# Patient Record
Sex: Female | Born: 1980 | Race: White | Hispanic: No | Smoking: Never smoker
Health system: Southern US, Community
[De-identification: ages and names within clinical notes are randomized; demographics above are authoritative.]

## PROBLEM LIST (undated history)

## (undated) ENCOUNTER — Ambulatory Visit: Admission: EM | Payer: MEDICAID

## (undated) DIAGNOSIS — F32A Depression, unspecified: Secondary | ICD-10-CM

## (undated) DIAGNOSIS — T7840XA Allergy, unspecified, initial encounter: Secondary | ICD-10-CM

## (undated) DIAGNOSIS — T4145XA Adverse effect of unspecified anesthetic, initial encounter: Secondary | ICD-10-CM

## (undated) DIAGNOSIS — F419 Anxiety disorder, unspecified: Secondary | ICD-10-CM

## (undated) DIAGNOSIS — Z9889 Other specified postprocedural states: Secondary | ICD-10-CM

## (undated) DIAGNOSIS — R112 Nausea with vomiting, unspecified: Secondary | ICD-10-CM

## (undated) DIAGNOSIS — E119 Type 2 diabetes mellitus without complications: Secondary | ICD-10-CM

## (undated) DIAGNOSIS — M545 Low back pain, unspecified: Secondary | ICD-10-CM

## (undated) DIAGNOSIS — G709 Myoneural disorder, unspecified: Secondary | ICD-10-CM

## (undated) DIAGNOSIS — F209 Schizophrenia, unspecified: Secondary | ICD-10-CM

## (undated) DIAGNOSIS — J45909 Unspecified asthma, uncomplicated: Secondary | ICD-10-CM

## (undated) DIAGNOSIS — G43909 Migraine, unspecified, not intractable, without status migrainosus: Secondary | ICD-10-CM

## (undated) DIAGNOSIS — K219 Gastro-esophageal reflux disease without esophagitis: Secondary | ICD-10-CM

## (undated) DIAGNOSIS — M1990 Unspecified osteoarthritis, unspecified site: Secondary | ICD-10-CM

## (undated) DIAGNOSIS — T8859XA Other complications of anesthesia, initial encounter: Secondary | ICD-10-CM

## (undated) DIAGNOSIS — F319 Bipolar disorder, unspecified: Secondary | ICD-10-CM

## (undated) DIAGNOSIS — IMO0001 Reserved for inherently not codable concepts without codable children: Secondary | ICD-10-CM

## (undated) DIAGNOSIS — N200 Calculus of kidney: Secondary | ICD-10-CM

## (undated) DIAGNOSIS — D649 Anemia, unspecified: Secondary | ICD-10-CM

## (undated) DIAGNOSIS — E282 Polycystic ovarian syndrome: Secondary | ICD-10-CM

## (undated) DIAGNOSIS — I10 Essential (primary) hypertension: Secondary | ICD-10-CM

## (undated) DIAGNOSIS — F329 Major depressive disorder, single episode, unspecified: Secondary | ICD-10-CM

## (undated) HISTORY — DX: Allergy, unspecified, initial encounter: T78.40XA

## (undated) HISTORY — DX: Migraine, unspecified, not intractable, without status migrainosus: G43.909

## (undated) HISTORY — DX: Polycystic ovarian syndrome: E28.2

## (undated) HISTORY — PX: OTHER SURGICAL HISTORY: SHX169

## (undated) HISTORY — DX: Myoneural disorder, unspecified: G70.9

## (undated) HISTORY — PX: CHOLECYSTECTOMY: SHX55

## (undated) HISTORY — DX: Calculus of kidney: N20.0

## (undated) HISTORY — DX: Essential (primary) hypertension: I10

## (undated) HISTORY — DX: Anemia, unspecified: D64.9

## (undated) HISTORY — DX: Gastro-esophageal reflux disease without esophagitis: K21.9

## (undated) HISTORY — DX: Unspecified asthma, uncomplicated: J45.909

## (undated) HISTORY — DX: Unspecified osteoarthritis, unspecified site: M19.90

---

## 2002-11-03 ENCOUNTER — Encounter: Payer: Self-pay | Admitting: Emergency Medicine

## 2002-11-03 ENCOUNTER — Emergency Department (HOSPITAL_COMMUNITY): Admission: EM | Admit: 2002-11-03 | Discharge: 2002-11-04 | Payer: Self-pay | Admitting: Emergency Medicine

## 2003-03-21 ENCOUNTER — Other Ambulatory Visit: Admission: RE | Admit: 2003-03-21 | Discharge: 2003-03-21 | Payer: Self-pay | Admitting: Obstetrics and Gynecology

## 2003-08-22 ENCOUNTER — Ambulatory Visit (HOSPITAL_COMMUNITY): Admission: RE | Admit: 2003-08-22 | Discharge: 2003-08-22 | Payer: Self-pay

## 2004-02-29 ENCOUNTER — Inpatient Hospital Stay (HOSPITAL_COMMUNITY): Admission: AD | Admit: 2004-02-29 | Discharge: 2004-03-02 | Payer: Self-pay | Admitting: Family Medicine

## 2004-06-06 ENCOUNTER — Ambulatory Visit (HOSPITAL_COMMUNITY): Admission: RE | Admit: 2004-06-06 | Discharge: 2004-06-06 | Payer: Self-pay | Admitting: Family Medicine

## 2004-11-09 ENCOUNTER — Emergency Department (HOSPITAL_COMMUNITY): Admission: EM | Admit: 2004-11-09 | Discharge: 2004-11-09 | Payer: Self-pay | Admitting: *Deleted

## 2005-02-03 ENCOUNTER — Emergency Department (HOSPITAL_COMMUNITY): Admission: EM | Admit: 2005-02-03 | Discharge: 2005-02-03 | Payer: Self-pay | Admitting: Emergency Medicine

## 2005-02-05 ENCOUNTER — Ambulatory Visit (HOSPITAL_COMMUNITY): Admission: RE | Admit: 2005-02-05 | Discharge: 2005-02-05 | Payer: Self-pay | Admitting: Family Medicine

## 2005-03-24 ENCOUNTER — Encounter: Admission: RE | Admit: 2005-03-24 | Discharge: 2005-03-24 | Payer: Self-pay | Admitting: Neurology

## 2005-07-22 ENCOUNTER — Other Ambulatory Visit: Admission: RE | Admit: 2005-07-22 | Discharge: 2005-07-22 | Payer: Self-pay | Admitting: Obstetrics and Gynecology

## 2006-05-15 ENCOUNTER — Ambulatory Visit (HOSPITAL_COMMUNITY): Admission: RE | Admit: 2006-05-15 | Discharge: 2006-05-15 | Payer: Self-pay | Admitting: Obstetrics and Gynecology

## 2007-08-18 ENCOUNTER — Emergency Department (HOSPITAL_COMMUNITY): Admission: EM | Admit: 2007-08-18 | Discharge: 2007-08-18 | Payer: Self-pay | Admitting: Emergency Medicine

## 2007-11-13 ENCOUNTER — Encounter: Admission: RE | Admit: 2007-11-13 | Discharge: 2007-11-13 | Payer: Self-pay | Admitting: Obstetrics and Gynecology

## 2008-03-29 ENCOUNTER — Emergency Department (HOSPITAL_COMMUNITY): Admission: EM | Admit: 2008-03-29 | Discharge: 2008-03-29 | Payer: Self-pay | Admitting: Emergency Medicine

## 2008-08-01 ENCOUNTER — Ambulatory Visit (HOSPITAL_COMMUNITY): Admission: RE | Admit: 2008-08-01 | Discharge: 2008-08-01 | Payer: Self-pay | Admitting: Family Medicine

## 2008-09-28 ENCOUNTER — Ambulatory Visit (HOSPITAL_COMMUNITY): Admission: RE | Admit: 2008-09-28 | Discharge: 2008-09-28 | Payer: Self-pay | Admitting: Family Medicine

## 2009-02-16 ENCOUNTER — Ambulatory Visit (HOSPITAL_COMMUNITY): Admission: RE | Admit: 2009-02-16 | Discharge: 2009-02-16 | Payer: Self-pay | Admitting: Obstetrics and Gynecology

## 2009-03-09 ENCOUNTER — Ambulatory Visit (HOSPITAL_COMMUNITY): Admission: RE | Admit: 2009-03-09 | Discharge: 2009-03-09 | Payer: Self-pay | Admitting: Obstetrics and Gynecology

## 2009-03-09 ENCOUNTER — Encounter (INDEPENDENT_AMBULATORY_CARE_PROVIDER_SITE_OTHER): Payer: Self-pay | Admitting: Obstetrics and Gynecology

## 2009-04-07 ENCOUNTER — Ambulatory Visit (HOSPITAL_COMMUNITY): Admission: RE | Admit: 2009-04-07 | Discharge: 2009-04-07 | Payer: Self-pay | Admitting: Obstetrics and Gynecology

## 2010-07-08 ENCOUNTER — Encounter: Payer: Self-pay | Admitting: Family Medicine

## 2010-09-21 LAB — CBC
HCT: 37.6 % (ref 36.0–46.0)
Hemoglobin: 12.4 g/dL (ref 12.0–15.0)
MCHC: 33.1 g/dL (ref 30.0–36.0)
MCV: 87.5 fL (ref 78.0–100.0)
Platelets: 249 10*3/uL (ref 150–400)
RBC: 4.29 MIL/uL (ref 3.87–5.11)
RDW: 13 % (ref 11.5–15.5)
WBC: 10.7 10*3/uL — ABNORMAL HIGH (ref 4.0–10.5)

## 2010-09-21 LAB — PREGNANCY, URINE: Preg Test, Ur: NEGATIVE

## 2010-11-02 NOTE — Discharge Summary (Signed)
NAME:  Andrea [name], Andrea [name]                      ACCOUNT NO.:  [id number]   MEDICAL RECORD NO.:  [id number]                   PATIENT TYPE:  INP   LOCATION:  A311                                 FACILITY:  [facility]   PHYSICIAN:  Scott A. Gerda Diss, M.D.               DATE OF BIRTH:  01/07/1981   DATE OF ADMISSION:  02/29/2004  DATE OF DISCHARGE:  03/02/2004                                 DISCHARGE SUMMARY   DISCHARGE DIAGNOSES:  1.  Bronchitis with supraglottic inflammation and stridor.  2.  Abdominal pain/gastritis.   HOSPITAL COURSE:  This 30 year old white female was admitted in after  complaining of some difficulty breathing, coughing, also severe soreness in  the right upper quadrant. She was admitted in, and after she was admitted  in, her abdominal pain localized to the epigastric area with no right upper  quadrant tenderness. Her ultrasound was negative for gallstones, and her  abdomen was consistent with some mild gastritis. She does have some  supraglottic irrigation that causes her to have occasional stridor, but she  was not in respiratory distress during hospitalization. She responded to  [facility] as well as steroids. We went ahead and covered her at discharge on  Zithromax to cover for bronchitis. There was some variation in her stridor  depending on if you asked her to take a deep breath versus just listening to  her when she was in restful breathing. She was stable for discharge on  September 16 and discharged to home at that time.     ___________________________________________                                         Jonna Coup Gerda Diss, M.D.   SAL/MEDQ  D:  03/02/2004  T:  03/02/2004  Job:  [id number]

## 2010-11-02 NOTE — Consult Note (Signed)
NAME:  Andrea [name], Andrea [name]                      ACCOUNT NO.:  [id number]   MEDICAL RECORD NO.:  [id number]                   PATIENT TYPE:  INP   LOCATION:  A311                                 FACILITY:  [facility]   PHYSICIAN:  Scott A. Gerda Diss, M.D.               DATE OF BIRTH:  1980-10-04   DATE OF CONSULTATION:  DATE OF DISCHARGE:                                   CONSULTATION   HISTORY:  The patient overall is doing pretty well.  She is having some  coughing and congestion.  Also some soreness in her abdomen.  We will await  the ultrasound for today.  No pneumonia is heard.  There is mild  supraglottic irritation secondary to what is most likely a viral illness.                                                Scott A. Gerda Diss, M.D.    Hadley Pen  D:  03/02/2004  T:  03/02/2004  Job:  [id number]

## 2010-11-02 NOTE — H&P (Signed)
Andrea [name], KLIETHERMES            ACCOUNT NO.:  [id number]   MEDICAL RECORD NO.:  [id number]          PATIENT TYPE:  INP   LOCATION:  A311                          FACILITY:  [facility]   PHYSICIAN:  Donna Bernard, M.D.DATE OF BIRTH:  1981-06-17   DATE OF ADMISSION:  02/29/2004  DATE OF DISCHARGE:  09/16/2005LH                                HISTORY & PHYSICAL   CHIEF COMPLAINT:  Shortness of breath, abdominal discomfort.   SUBJECTIVE:  This patient is a 30 year old white female with a history of  impaired glucose tolerance, who presented to the office the day of admission  with two acute complaints.  The patient had noted that she had had  congestion and drainage and cough over this last several days, but it had  proceeded to the point of affecting her ability to breathe.  She was having  significant dyspnea, and this was accompanied by a wheezy sensation.  She  was given a nebulizer treatment in the office with, in fact, no improvement,  if anything somewhat worsening.  The patient also noted right upper  abdominal discomfort progressive over the past week, at times seems to be  worse after meals.  There is minimal radiation.  At times the abdominal  discomfort is quite significant.  The patient notes compliance with her  current medications, which include Glucophage 500 mg one t.i.d.   FAMILY HISTORY:  Noncontributory.   SOCIAL HISTORY:  The patient is married.   No known allergies.   PAST MEDICAL HISTORY:  Significant for as noted above.   REVIEW OF SYSTEMS:  Otherwise negative.   PHYSICAL EXAMINATION:  VITAL SIGNS:  Afebrile.  GENERAL:  The patient is an alert, anxious-appearing white female with some  obvious respiratory distress.  HEENT:  Normal.  Pharynx normal.  TMs normal.  Ocular exam normal.  NECK:  Supple, no lymphadenopathy.  CHEST:  Stridor-like sounds occurring with breathing.  There also appears to  be mild reactive component when listening to the chest.  No  tachypnea.  CARDIAC:  Regular rate and rhythm.  ABDOMEN:  Right upper quadrant tenderness to deep palpation.  No CVA  tenderness.  Low abdomen nontender.  EXTREMITIES:  Normal.  NEUROLOGIC:  Intact.   IMPRESSION:  1.  Respiratory distress with significant stridor component.  This is the      primary reason for admission.  2.  Abdominal pain, right upper quadrant, steady but worsening.   PLAN:  1.  Admit for racemic epinephrine treatment, Solu-Medrol IV, Ventolin via      nebulizer p.r.n. for wheezes, nasal cannula O2, monitor O2 saturation.  2.  Initiate antibiotics, Rocephin specifically, due to current symptoms.  3.  Check ultrasound of the gallbladder.  Further or as is noted in the      chart.      WSL/MEDQ  D:  03/23/2004  T:  03/23/2004  Job:  [id number]

## 2010-11-02 NOTE — Op Note (Signed)
NAMEBRAYLYN, EYE            ACCOUNT NO.:  [id number]   MEDICAL RECORD NO.:  [id number]          PATIENT TYPE:  AMB   LOCATION:  SDC                           FACILITY:  [facility]   PHYSICIAN:  Malva Limes, M.D.    DATE OF BIRTH:  1980/12/16   DATE OF PROCEDURE:  05/15/2006  DATE OF DISCHARGE:                               OPERATIVE REPORT   PREOPERATIVE DIAGNOSES:  1. Complex left ovarian cyst.  2. History of infertility.  3. History of anovulation.   POSTOPERATIVE DIAGNOSES:  1. Complex left ovarian cyst.  2. History of infertility.  3. History of anovulation.   PROCEDURES:  1. Diagnostic laparoscopy  2. Left ovarian cystectomy.  3. Removal of the anterior cul-de-sac nodule.  4. Removal of left ovarian nodule.  5. Lysis of adhesions on right ovary.   SURGEON:  Dr. Dareen Piano.   ASSISTANT:  Dr. Greta Doom.   ANESTHESIA:  General endotracheal.   ANTIBIOTICS:  Ancef 1 gram.   DRAINS:  Red rubber catheter to bladder.   COMPLICATIONS:  None.   SPECIMENS:  Left ovarian cyst wall sent to pathology, anterior cul-de-  sac nodule sent to pathology, and the left serosal nodule versus ovary  sent to pathology.   INDICATIONS:  The patient is a 30 year old white female G0, P0, with a  long history of the anovulation.  The patient had tried Glucophage  without success and was going to use Clomid to treat her anovulatory  cycles.  However, she was discovered to have an ovarian cyst.  This was  followed and showed no change.  On 04/30/2006, the patient had a follow-  up ultrasound which revealed 6.5 cm ovarian cyst.  It appeared that  there was one septation, no solid nodules and normal Doppler flow.  Because of this, the patient was unable to begin Clomid and it was  decided to perform a laparoscopy and ovarian cystectomy.   PROCEDURE:  The patient was taken to the operating room where she was  placed in dorsal supine position and general anesthetic was administered  without  complications.  She was then prepped and draped in usual  fashion.  A cone cannula placed into the cervical os.  The umbilicus had  a vertical skin incision made, carried down to fascia.  Fascia was  entered sharply.  Parietal peritoneum was grasped and entered sharply.  Sutures were placed laterally and the Hassan cannula placed in the  peritoneal cavity.  Three liters of carbon dioxide was insufflated.  The  patient was placed in Trendelenburg.  The scope was then placed.  At  this point, ports were placed in the right and left lower quadrants,  under direct visualization, and a 10-mm port placed in the suprapubic  region.  The patient was noted to have a low normal liver and  gallbladder.  There were no abdominal adhesions.  The uterus appeared to  be normal.  Fallopian tube on the right was easily visualized and  appeared to be normal.  The right ovary was adherent to the right pelvic  sidewall.  The patient had a large ovarian cyst on  the left ovary.  The  left fallopian tube could not be seen until after the cystectomy was  performed.  At that point it was discovered to be normal.  To begin the  cystectomy an incision was made in the serosal surface of the ovary with  a needle suction irrigator.  The serosa was opened and grasped with  nontraumatic graspers.  The ovarian cyst was shelled out approximately  50-70% when a rent was made in the capsule cyst.  At this point copious  amounts of yellow clear fluid were noted.  On examining the inner lining  of the ovarian cyst, there appeared to be no nodules.  It was smooth  throughout.  The cyst was then pulled from the ovary.  Minimal bleeding  was noted.  Once this was accomplished, it appeared that there was  either ovarian tissue or a nodule in the area just inferior to the  midportion of the tube.  This had several small cysts on it and was  worrisome.  Therefore, this was excised and placed in the posterior cul-  de-sac.  At this  point the EndoCatch was placed in the abdominal cavity  and the ovarian cyst and nodule placed into the bag and removed through  the port in the midline.  At this point, copious irrigation was  performed and hemostasis appeared to be adequate.  The patient was  discovered to have a small cystic nodule in the left anterior cul-de-  sac.  This was grasped and the entire peritoneum excised in this area  and pulled through the port.  It appeared to be approximately 5-7 mm.  At this point, chromotubation was performed.  The blue dye was easily  seen to flow through both fallopian tubes.  The fimbria appeared to be  healthy and normal.  This completed the procedure.  Copious irrigation  was formed, hemostasis again checked and found to be good.  The  instruments were all removed, the ports removed, the fascia closed with  interrupted 0 Vicryl suture and the skin with 4-0 Vicryl suture.  The  inferior ports were closed with Dermabond.  The patient was discharged  to home.  Prior to discharge she will have a CA-125 and a CEA obtained.  She will be sent home with Percocet and asked to return to the office in  2 weeks.           ______________________________  Malva Limes, M.D.     MA/MEDQ  D:  05/15/2006  T:  05/15/2006  Job:  [id number]

## 2010-11-02 NOTE — Consult Note (Signed)
Andrea [name], Andrea [name]            ACCOUNT NO.:  [id number]   MEDICAL RECORD NO.:  [id number]          PATIENT TYPE:  EMS   LOCATION:  MAJO                         FACILITY:  [facility]   PHYSICIAN:  Pramod P. Pearlean Brownie, MD    DATE OF BIRTH:  07/01/1980   DATE OF CONSULTATION:  11/09/2004  DATE OF DISCHARGE:                                   CONSULTATION   REFERRING PHYSICIAN:  Carren Rang, M.D.   REASON FOR REFERRAL:  Speech difficulty and left-sided weakness.   HISTORY OF PRESENT ILLNESS:  Andrea [name] is a 30 year old Caucasian lady  who developed apparently sudden onset of altered mental status and speech  difficulties this afternoon.  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  She was initially thought to have been exhausted from overexertion, however,  when she did not respond after several hours, EMS was called and EMS  described her upon arriving as having some tonic-clonic movements in her  arms with some fluttering of her eyes.  However, she was awake with eyes  open.  There were no tongue biting or incontinence noted.  The patient  appeared to be flushed and somewhat mottled.  Subsequently she was found to  be having difficulty speaking and had a very hesitant effort for speech.  She was also found not moving the left side as well as the right.  She was  taken to the emergency room at [facility] in [place] where a  noncontrast CAT scan of head was unremarkable.  Since the patient continued  to have persistent left-sided weakness and some speech difficulties, and did  not have a neurologist on call, I was called by Dr. Beverely Pace who recommended  doing an MRI scan of the brain; however, an MRI was not available at  [facility].  The patient was transferred to the emergency room at  [facility] for my  consultation and MRI.  The patient denied any  previous history of stroke, TIA, migraine or significant neurological  problems.   PAST MEDICAL HISTORY:  1.  Diabetes.  2.  Polycystic ovarian syndrome.   HOME MEDICATIONS:  Allegra, amoxicillin, Glucophage, vitamins.   MEDICATION ALLERGIES:  None.   SOCIAL HISTORY:  The patient is married and lives with her husband in  [place].  She does not smoke or drink and denies doing drugs.  Her family  physician is Dr. Lilyan Punt.  The patient denies any prior known history  of psychiatric problems, anxiety, depression or significant stressors in her  life.   REVIEW OF SYSTEMS:  Not significant for recent fever, cough, chest pain,  diarrhea, shortness of breath, or other illness.   PHYSICAL EXAMINATION:  GENERAL:  Reveals an obese, young, Caucasian lady who  is not in distress.  VITAL SIGNS:  Afebrile.  Pulse rate is 100 per minute.  Respiratory rate 18  per minute.  Blood pressure 148/92.  Distal pulses are well  felt.  HEAD:  Nontraumatic.  ENT exam is unremarkable.  NECK:  Supple without bruit.  CARDIAC EXAM:  No murmurs, rubs, or gallops.  LUNGS:  Clear to auscultation.  ABDOMEN:  Soft, nontender.  NEUROLOGICAL EXAM:  The patient is awake, alert and cooperative.  She has a  nonfluent speech with significant word hesitation.  However, there is no  clear dysarthria or any ___________.  She can name, repeat and comprehend  quite well.  Eye movements are full range with no nystagmus.  Visual acuity  and field side adequate.  Face is symmetric.  Bilateral movements are  normal.  Tongue is midline.  Motor system exam reveals no significant  weakness on the right side.  The patient has poor effort on the left side  and when lying supine, she initially was unable to lift her hand off the  bed.  However, when I passively held it front of her face, she was able to  maintain tone there without any drift of the hand.  Similarly in the leg,   she states weakness in moving the leg off the bed, but is able to maintain  tone against pressure.  There is __________ and strength on the left side.  She similarly had subjective decreased sensation on the left side, but on  __________ stimulation is able to perceive sensation on both sides.  Deep  tendon reflexes are 2+, symmetric and __________ plantars are downgoing.  Coordination is slow and deliberate on the left.  Gait was not tested.   DATA REVIEWED:  Noncontrast MRI scan of the brain done today reveals no  evidence of acute ischemia on the diffusion weighted images.  The rest of  the brain scan appears unremarkable.  CT scan of the brain was unremarkable.   LABORATORY DATA:  Done at [facility] today:  Blood glucose 114 as  reported by the EMS at scene.  Blood electrolytes normal.  Urine pregnancy  test was negative.  Urine wbc count was 12.0.  Urine drug screen is  negative.   IMPRESSION:  A 30 year old lady with sudden onset of speech difficulties and  questionable left-sided weakness with neurological exam which is not  fluctuating and not consistent with organic basis.  I think there is  significant underlying psychological distress which is causing the patient's  findings.  Even though the patient categorically denies significant  underlying stress, I think the patient would resolve by consultation of the  ACT Team to provide psychological support for the patient.  I do not believe  that the patient is at risk for recurrent strokes and hence may be  discharged to the care of her family.  She may follow up with her family  physician Dr. Gerda Diss electively in [place] as necessary.   Thank you for this referral.      PPS/MEDQ  D:  11/09/2004  T:  11/10/2004  Job:  [id number]   cc:   Carren Rang, M.D.

## 2010-11-22 ENCOUNTER — Ambulatory Visit (HOSPITAL_COMMUNITY)
Admission: RE | Admit: 2010-11-22 | Discharge: 2010-11-22 | Disposition: A | Discharge status: Home / Self Care | Payer: BC Managed Care – PPO | Source: Ambulatory Visit | Attending: Family Medicine | Admitting: Family Medicine

## 2010-11-22 ENCOUNTER — Other Ambulatory Visit: Payer: Self-pay | Admitting: Family Medicine

## 2010-11-22 DIAGNOSIS — R1011 Right upper quadrant pain: Secondary | ICD-10-CM

## 2010-11-22 DIAGNOSIS — R112 Nausea with vomiting, unspecified: Secondary | ICD-10-CM | POA: Insufficient documentation

## 2010-11-22 DIAGNOSIS — K769 Liver disease, unspecified: Secondary | ICD-10-CM | POA: Insufficient documentation

## 2010-11-22 DIAGNOSIS — R110 Nausea: Secondary | ICD-10-CM

## 2010-11-23 ENCOUNTER — Other Ambulatory Visit: Payer: Self-pay | Admitting: Family Medicine

## 2010-11-26 ENCOUNTER — Emergency Department (HOSPITAL_COMMUNITY)
Admission: EM | Admit: 2010-11-26 | Discharge: 2010-11-26 | Disposition: A | Discharge status: Home / Self Care | Payer: BC Managed Care – PPO | Attending: Emergency Medicine | Admitting: Emergency Medicine

## 2010-11-26 ENCOUNTER — Emergency Department (HOSPITAL_COMMUNITY): Payer: BC Managed Care – PPO

## 2010-11-26 ENCOUNTER — Encounter (HOSPITAL_COMMUNITY): Payer: Self-pay | Admitting: Radiology

## 2010-11-26 DIAGNOSIS — E282 Polycystic ovarian syndrome: Secondary | ICD-10-CM | POA: Insufficient documentation

## 2010-11-26 DIAGNOSIS — Z79899 Other long term (current) drug therapy: Secondary | ICD-10-CM | POA: Insufficient documentation

## 2010-11-26 DIAGNOSIS — R1011 Right upper quadrant pain: Secondary | ICD-10-CM | POA: Insufficient documentation

## 2010-11-26 LAB — DIFFERENTIAL
Eosinophils Relative: 2 % (ref 0–5)
Lymphocytes Relative: 27 % (ref 12–46)
Lymphs Abs: 2 10*3/uL (ref 0.7–4.0)
Monocytes Absolute: 0.4 10*3/uL (ref 0.1–1.0)
Monocytes Relative: 6 % (ref 3–12)

## 2010-11-26 LAB — URINALYSIS, ROUTINE W REFLEX MICROSCOPIC
Bilirubin Urine: NEGATIVE
Nitrite: NEGATIVE
Protein, ur: NEGATIVE mg/dL
Specific Gravity, Urine: 1.02 (ref 1.005–1.030)
Urobilinogen, UA: 0.2 mg/dL (ref 0.0–1.0)

## 2010-11-26 LAB — CBC
HCT: 36.9 % (ref 36.0–46.0)
MCH: 27.6 pg (ref 26.0–34.0)
MCHC: 32 g/dL (ref 30.0–36.0)
MCV: 86.4 fL (ref 78.0–100.0)
RDW: 13.4 % (ref 11.5–15.5)

## 2010-11-26 LAB — COMPREHENSIVE METABOLIC PANEL
BUN: 9 mg/dL (ref 6–23)
Calcium: 9.3 mg/dL (ref 8.4–10.5)
GFR calc Af Amer: >60 mL/min (ref >60)
GFR calc non Af Amer: >60 mL/min (ref >60)
Glucose, Bld: 93 mg/dL (ref 70–99)
Total Protein: 7.3 g/dL (ref 6.0–8.3)

## 2010-11-26 LAB — URINE MICROSCOPIC-ADD ON

## 2010-11-26 LAB — LIPASE, BLOOD: Lipase: 28 U/L (ref 11–59)

## 2010-11-26 MED ORDER — IOHEXOL 300 MG/ML  SOLN
100.0000 mL | Freq: Once | INTRAMUSCULAR | Status: AC | PRN
  Administered 2010-11-26: 100 mL via INTRAVENOUS

## 2010-11-26 MED ORDER — TECHNETIUM TC 99M MEBROFENIN IV KIT
5.0000 | PACK | Freq: Once | INTRAVENOUS | Status: AC | PRN
  Administered 2010-11-26: 5.18 via INTRAVENOUS

## 2010-11-28 ENCOUNTER — Encounter (HOSPITAL_COMMUNITY): Payer: BC Managed Care – PPO

## 2010-11-28 ENCOUNTER — Other Ambulatory Visit: Payer: Self-pay | Admitting: General Surgery

## 2010-11-29 ENCOUNTER — Encounter (HOSPITAL_COMMUNITY): Payer: BC Managed Care – PPO

## 2010-11-30 ENCOUNTER — Ambulatory Visit (HOSPITAL_COMMUNITY)
Admission: RE | Admit: 2010-11-30 | Discharge: 2010-11-30 | Disposition: A | Discharge status: Home / Self Care | Payer: BC Managed Care – PPO | Source: Ambulatory Visit | Attending: General Surgery | Admitting: General Surgery

## 2010-11-30 DIAGNOSIS — Z01818 Encounter for other preprocedural examination: Secondary | ICD-10-CM | POA: Insufficient documentation

## 2010-11-30 DIAGNOSIS — Z01812 Encounter for preprocedural laboratory examination: Secondary | ICD-10-CM | POA: Insufficient documentation

## 2010-11-30 DIAGNOSIS — K811 Chronic cholecystitis: Secondary | ICD-10-CM | POA: Insufficient documentation

## 2010-12-10 NOTE — Op Note (Signed)
Andrea [name], FRATTO            ACCOUNT NO.:  [id number]  MEDICAL RECORD NO.:  [id number]  LOCATION:  DAYP                          FACILITY:  [facility]  PHYSICIAN:  Dalia Heading, M.D.  DATE OF BIRTH:  08-Jun-1981  DATE OF PROCEDURE:  11/30/2010 DATE OF DISCHARGE:                              OPERATIVE REPORT   PREOPERATIVE DIAGNOSIS:  Chronic cholecystitis.  POSTOPERATIVE DIAGNOSIS:  Chronic cholecystitis.  PROCEDURE:  Laparoscopic cholecystectomy.  SURGEON:  Dalia Heading, MD  ANESTHESIA:  General endotracheal.  INDICATIONS:  The patient is a 30 year old morbidly obese white female who presents with worsening right upper quadrant abdominal pain. Hepatobiliary scan revealed a low gallbladder ejection fraction with reproducible symptoms with CCK injection.  She now presents for laparoscopic cholecystectomy.  The risks and benefits of procedure including bleeding, infection, hepatobiliary injury, and possibility of an open procedure were fully explained to the patient, gave informed consent.  PROCEDURE NOTE:  The patient was placed in the supine position.  After induction of general endotracheal anesthesia, the abdomen was prepped and draped using the usual sterile technique with DuraPrep.  Surgical site confirmation was performed.  A supraumbilical incision was made down to the fascia.  A Veress needle was introduced into the abdominal cavity and confirmation of placement was done using the saline drop test.  The abdomen was then insufflated to 16 mmHg of pressure.  An 11-mm trocar was introduced into the abdominal cavity under direct visualization without difficulty.  The patient was placed in reverse Trendelenburg position and additional 11- mm trocar was placed in the epigastric region and 5-mm trocars were placed in right upper quadrant and right flank regions.  The liver was inspected and noted to be within normal limits.  The gallbladder was retracted  superolaterally.  Dissection was begun around the infundibulum of the gallbladder.  The cystic duct was first identified.  The infundibulum was fully identified.  Endoclips were placed proximally and distally on the cystic duct and the cystic duct was divided.  This likewise was done on the cystic artery.  The gallbladder was then freed away from the gallbladder fossa using Bovie electrocautery.  The gallbladder was delivered through the epigastric trocar site using Endocatch bag.  The gallbladder fossa was inspected and no abnormal bleeding or bile leakage was noted.  Surgicel was placed in the gallbladder fossa.  All fluid and air were then evacuated from the abdominal cavity prior to removal of the trocars.  All wounds were irrigated with normal saline.  All wounds were injected with 0.5% Sensorcaine.  The supraumbilical fascia was reapproximated using an 0 Vicryl interrupted suture.  All skin incisions were closed using staples.  Betadine ointment and dry sterile dressings were applied.  All tape and needle counts were correct at the end of the procedure. The patient was extubated in the operating room and went back to the recovery room in awake and stable condition.  COMPLICATIONS:  None.  SPECIMEN:  Gallbladder.  BLOOD LOSS:  Minimal.     Dalia Heading, M.D.     MAJ/MEDQ  D:  11/30/2010  T:  12/01/2010  Job:  [id number]  cc:   Lorin Picket A. Gerda Diss, MD Fax: [phone]  Electronically Signed by Franky Macho M.D. on 12/10/2010 07:28:11 AM

## 2010-12-10 NOTE — H&P (Signed)
  Andrea [name], Andrea [name]            ACCOUNT NO.:  [id number]  MEDICAL RECORD NO.:  [id number]  LOCATION:  DAY                           FACILITY:  [facility]  PHYSICIAN:  Dalia Heading, M.D.  DATE OF BIRTH:  Oct 12, 1980  DATE OF ADMISSION:  11/27/2010 DATE OF DISCHARGE:  LH                             HISTORY & PHYSICAL   CHIEF COMPLAINT:  Chronic cholecystitis.  HISTORY OF PRESENT ILLNESS:  The patient is an obese white female who presents with several-week history of worsening right upper quadrant abdominal pain with radiation to the right flank, nausea, vomiting.  She states she does have fatty food intolerance.  She does have intermittent fever and chills, but no jaundice.  PAST MEDICAL HISTORY:  Includes anxiety/depressive disorder.  PAST SURGICAL HISTORY:  Ovarian cyst removal.  CURRENT MEDICATIONS:  Zoloft, Topamax, Altavera.  ALLERGIES:  Vicodin.  REVIEW OF SYSTEMS:  The patient denies drinking or smoking.  She denies any illicit drug use.  FAMILY MEDICAL HISTORY:  Noncontributory.  PHYSICAL EXAMINATION:  GENERAL:  The patient is a well-developed, well- nourished white female in no acute distress. HEENT:  Unremarkable. NECK:  Supple without lymphadenopathy. LUNGS:  Clear to auscultation with equal breath sounds bilaterally. HEART:  Examination reveals regular rate and rhythm without S3, S4, or murmurs. ABDOMEN:  Soft with tenderness noted on the right upper quadrant to palpation.  No hepatosplenomegaly, masses, hernias are identified.  Ultrasound of the gallbladder is negative.  CT scan of the abdomen and pelvis is negative.  HIDA  scan reveals a 34% ejection fraction with reducible symptoms with CCK injection.  IMPRESSION:  Chronic cholecystitis.  PLAN:  The patient is scheduled for a laparoscopic cholecystectomy on November 30, 2010.  The risks and benefits of the procedure including bleeding, infection, hepatobiliary, the possibility of an open procedure were  fully explained to the patient, gave informed consent.     Dalia Heading, M.D.     MAJ/MEDQ  D:  11/27/2010  T:  11/28/2010  Job:  [id number]  cc:   Short Stay To [facility]  Scott A. Gerda Diss, MD Fax: [phone]  Electronically Signed by Franky Macho M.D. on 12/10/2010 07:28:09 AM

## 2011-03-11 LAB — URINE MICROSCOPIC-ADD ON

## 2011-03-11 LAB — URINALYSIS, ROUTINE W REFLEX MICROSCOPIC
Bilirubin Urine: NEGATIVE
Nitrite: NEGATIVE
Specific Gravity, Urine: 1.02
Urobilinogen, UA: 0.2
pH: 6.5

## 2011-03-11 LAB — BASIC METABOLIC PANEL
BUN: 8
CO2: 29
Calcium: 8.9
Chloride: 102
Creatinine, Ser: 0.72
GFR calc Af Amer: >60
Glucose, Bld: 90

## 2011-03-11 LAB — PREGNANCY, URINE: Preg Test, Ur: NEGATIVE

## 2011-03-11 LAB — CBC
MCHC: 33.4
MCV: 86.2
Platelets: 222
RDW: 13.1

## 2011-03-11 LAB — DIFFERENTIAL
Basophils Absolute: 0.1
Basophils Relative: 1
Eosinophils Absolute: 0.2
Neutro Abs: 4.2
Neutrophils Relative %: 51

## 2011-08-26 ENCOUNTER — Other Ambulatory Visit: Payer: Self-pay | Admitting: Family Medicine

## 2011-08-26 ENCOUNTER — Ambulatory Visit (HOSPITAL_COMMUNITY)
Admission: RE | Admit: 2011-08-26 | Discharge: 2011-08-26 | Disposition: A | Discharge status: Home / Self Care | Payer: BC Managed Care – PPO | Source: Ambulatory Visit | Attending: Family Medicine | Admitting: Family Medicine

## 2011-08-26 DIAGNOSIS — M545 Low back pain, unspecified: Secondary | ICD-10-CM | POA: Insufficient documentation

## 2011-08-26 DIAGNOSIS — R05 Cough: Secondary | ICD-10-CM | POA: Insufficient documentation

## 2011-08-26 DIAGNOSIS — R0602 Shortness of breath: Secondary | ICD-10-CM | POA: Insufficient documentation

## 2011-08-26 DIAGNOSIS — R059 Cough, unspecified: Secondary | ICD-10-CM | POA: Insufficient documentation

## 2011-08-26 DIAGNOSIS — M4120 Other idiopathic scoliosis, site unspecified: Secondary | ICD-10-CM | POA: Insufficient documentation

## 2012-02-08 DIAGNOSIS — R0602 Shortness of breath: Secondary | ICD-10-CM

## 2012-03-26 ENCOUNTER — Other Ambulatory Visit: Payer: Self-pay | Admitting: Obstetrics and Gynecology

## 2012-04-30 ENCOUNTER — Other Ambulatory Visit: Payer: Self-pay | Admitting: Family Medicine

## 2012-04-30 ENCOUNTER — Ambulatory Visit (HOSPITAL_COMMUNITY)
Admission: RE | Admit: 2012-04-30 | Discharge: 2012-04-30 | Disposition: A | Discharge status: Home / Self Care | Payer: BC Managed Care – PPO | Source: Ambulatory Visit | Attending: Family Medicine | Admitting: Family Medicine

## 2012-04-30 DIAGNOSIS — R059 Cough, unspecified: Secondary | ICD-10-CM | POA: Insufficient documentation

## 2012-04-30 DIAGNOSIS — R05 Cough: Secondary | ICD-10-CM

## 2012-04-30 DIAGNOSIS — J40 Bronchitis, not specified as acute or chronic: Secondary | ICD-10-CM

## 2012-09-11 ENCOUNTER — Ambulatory Visit (INDEPENDENT_AMBULATORY_CARE_PROVIDER_SITE_OTHER): Payer: BC Managed Care – PPO | Admitting: Nurse Practitioner

## 2012-09-11 ENCOUNTER — Encounter: Payer: Self-pay | Admitting: Nurse Practitioner

## 2012-09-11 VITALS — BP 132/86 | Wt 292.6 lb

## 2012-09-11 DIAGNOSIS — R300 Dysuria: Secondary | ICD-10-CM

## 2012-09-11 DIAGNOSIS — N10 Acute tubulo-interstitial nephritis: Secondary | ICD-10-CM

## 2012-09-11 DIAGNOSIS — J0100 Acute maxillary sinusitis, unspecified: Secondary | ICD-10-CM

## 2012-09-11 LAB — POCT UA - MICROSCOPIC ONLY: Bacteria, U Microscopic: 0

## 2012-09-11 LAB — POCT URINALYSIS DIPSTICK
Protein, UA: NEGATIVE
Spec Grav, UA: 1.01
pH, UA: 7

## 2012-09-11 MED ORDER — OXYCODONE-ACETAMINOPHEN 5-325 MG PO TABS: 1.0000 | ORAL_TABLET | ORAL | Status: DC | PRN

## 2012-09-11 MED ORDER — CEFTRIAXONE SODIUM 1 G IJ SOLR
500.0000 mg | Freq: Once | INTRAMUSCULAR | Status: AC
  Administered 2012-09-11: 500 mg via INTRAMUSCULAR

## 2012-09-11 MED ORDER — CEFPROZIL 500 MG PO TABS: 500.0000 mg | ORAL_TABLET | Freq: Two times a day (BID) | ORAL | Status: DC

## 2012-09-11 MED ORDER — PROMETHAZINE HCL 25 MG PO TABS: 25.0000 mg | ORAL_TABLET | Freq: Four times a day (QID) | ORAL | Status: DC | PRN

## 2012-09-12 ENCOUNTER — Encounter: Payer: Self-pay | Admitting: Nurse Practitioner

## 2012-09-12 DIAGNOSIS — J0100 Acute maxillary sinusitis, unspecified: Secondary | ICD-10-CM | POA: Insufficient documentation

## 2012-09-12 DIAGNOSIS — N200 Calculus of kidney: Secondary | ICD-10-CM | POA: Insufficient documentation

## 2012-09-12 DIAGNOSIS — N10 Acute tubulo-interstitial nephritis: Secondary | ICD-10-CM | POA: Insufficient documentation

## 2012-09-12 NOTE — Progress Notes (Signed)
Subjective:  Presents for complaints of sharp pain in the right flank area during the night last night, the last time she felt this was at 3 AM this morning. Now having a throbbing numbness/soreness in the right flank area towards the lower abdominal area. Nausea, no vomiting. No blood in her urine. Urine has odor and cloudiness. Temp 101-102. Has voided twice today, has had pain with urination. No urgency or frequency. Had a kidney stone a few years ago. No vaginal discharge. No new sexual partners. No history of recent UTI or pyelonephritis. Also complaints of green drainage in her left eye that began 2 days ago. Occurs off and on all day. Some fullness in the ears with decreased hearing. No sore throat. No cough. No wheezing. Slight head congestion.  Objective:   BP 132/86  Wt 292 lb 9.6 oz (132.722 kg)  LMP 08/29/2012 NAD. Alert, oriented. TMs clear effusion, no erythema. Conjunctiva clear. Pharynx mildly erythematous with green PND noted. Neck supple with mild soft nontender adenopathy. No preauricular adenopathy noted. Lungs clear. Heart regular rate rhythm. Positive right CVA area tenderness going into the flank area. Abdomen soft nondistended with mild suprapubic area tenderness. Urine microscopic negative.

## 2012-09-12 NOTE — Assessment & Plan Note (Signed)
Cefzil prescribed. OTC meds as directed for congestion. Call back if worsens or persists.

## 2012-09-12 NOTE — Assessment & Plan Note (Addendum)
Patient possibly has passed or is passing a kidney stone. No blood was noted under urine microscopic. Since she has had a fever will treat for probable pyelonephritis. Rocephin 500 mg IM now. Tomorrow start Cefzil as directed. Also given prescriptions for Phenergan and oxycodone for pain. Call back in 72 hours if no improvement, call or go to ED over the weekend if worse. Warning signs reviewed.

## 2012-10-28 ENCOUNTER — Telehealth: Payer: Self-pay | Admitting: Family Medicine

## 2012-10-28 NOTE — Telephone Encounter (Signed)
Needs office visit with Eber Jones to discuss per Eber Jones

## 2012-10-28 NOTE — Telephone Encounter (Signed)
Patient says she had a miscarriage and is having a hard time dealing with it. Please advise. She is requesting to have something called in but will come in if need be.

## 2012-10-28 NOTE — Telephone Encounter (Signed)
Left message to return call 

## 2012-10-28 NOTE — Telephone Encounter (Signed)
Pt made appt to come in tomorrow.

## 2012-10-29 ENCOUNTER — Ambulatory Visit (INDEPENDENT_AMBULATORY_CARE_PROVIDER_SITE_OTHER): Payer: BC Managed Care – PPO | Admitting: Nurse Practitioner

## 2012-10-29 ENCOUNTER — Encounter: Payer: Self-pay | Admitting: Nurse Practitioner

## 2012-10-29 VITALS — BP 138/94 | HR 80 | Wt 291.6 lb

## 2012-10-29 DIAGNOSIS — G43909 Migraine, unspecified, not intractable, without status migrainosus: Secondary | ICD-10-CM

## 2012-10-29 DIAGNOSIS — F418 Other specified anxiety disorders: Secondary | ICD-10-CM

## 2012-10-29 DIAGNOSIS — F341 Dysthymic disorder: Secondary | ICD-10-CM

## 2012-10-29 MED ORDER — PHENTERMINE HCL 37.5 MG PO CAPS: 37.5000 mg | ORAL_CAPSULE | ORAL | Status: DC

## 2012-10-29 MED ORDER — ALPRAZOLAM 0.5 MG PO TABS: 0.5000 mg | ORAL_TABLET | Freq: Two times a day (BID) | ORAL | Status: DC

## 2012-10-29 NOTE — Patient Instructions (Signed)
Sleeve gastrectomy; gastric bypass; [facility] Bariatric

## 2012-10-30 ENCOUNTER — Encounter: Payer: Self-pay | Admitting: Nurse Practitioner

## 2012-10-30 DIAGNOSIS — G43909 Migraine, unspecified, not intractable, without status migrainosus: Secondary | ICD-10-CM | POA: Insufficient documentation

## 2012-10-30 DIAGNOSIS — F418 Other specified anxiety disorders: Secondary | ICD-10-CM | POA: Insufficient documentation

## 2012-10-30 DIAGNOSIS — E66813 Obesity, class 3: Secondary | ICD-10-CM | POA: Insufficient documentation

## 2012-10-30 NOTE — Assessment & Plan Note (Signed)
Start phentermine as directed. Recheck in one month. Also reviewed bariatric surgery.Marland Kitchen

## 2012-10-30 NOTE — Assessment & Plan Note (Signed)
Patient has weaned off Topamax and wishes to stay off this for now. Call back if any migraine exacerbation.

## 2012-10-30 NOTE — Progress Notes (Signed)
Subjective:  Presents for complaints of depression/anxiety exacerbation following a recent miscarriage. Patient was approximately [redacted] weeks pregnant. Was told on ultrasound that there was a sac but no embryo. Was on NuvaRing at the time of conception. Weaned off all her medications immediately when she found out she was pregnant. Patient was aware that her medications were contraindicated during pregnancy. Has appointment for recheck with her gynecologist in 2 weeks to discuss birth control, may need a D&C at that time if any problems. Will be switching to a new birth control, most likely pills. Crying at times. Waking up in the middle of the night. Would like medication to help her lose weight before she tries to conceive. Has taken phentermine without difficulty in the past. Is with a new sexual partner. Gets regular preventive health physicals.  Objective:   BP 138/94  Pulse 80  Wt 291 lb 9.6 oz (132.269 kg) NAD. Alert, oriented. Crying at times during office visit. Lungs clear. Heart regular rate rhythm. No murmur or gallop noted.  Assessment:Depression with anxiety  Migraines  Morbid obesity  Plan: Meds ordered this encounter  Medications  . ALPRAZolam (XANAX) 0.5 MG tablet    Sig: Take 1 tablet (0.5 mg total) by mouth 2 (two) times daily. Bid as needed    Dispense:  30 tablet    Refill:  2    Order Specific Question:  Supervising Provider    Answer:  Merlyn Albert [[id number]]  . phentermine 37.5 MG capsule    Sig: Take 1 capsule (37.5 mg total) by mouth every morning.    Dispense:  30 capsule    Refill:  0    Order Specific Question:  Supervising Provider    Answer:  Merlyn Albert [[id number]]   Continue Zoloft as directed. Patient elects to stay off of Topamax at this point, to call back if migraines increase. Advised patient to stop medications immediately if she becomes pregnant. Patient has no plans to conceive at this time, states she would like to lose weight and get healthier if  possible. Also explained the relationship of weight with PCOS and increase risk of infertility and miscarriage. Recheck in one month, call back sooner if any problems.

## 2012-10-30 NOTE — Assessment & Plan Note (Signed)
Continue Zoloft as directed. Given refill on Xanax for exacerbation of her anxiety symptoms.

## 2012-11-18 ENCOUNTER — Encounter: Payer: Self-pay | Admitting: *Deleted

## 2012-12-03 ENCOUNTER — Other Ambulatory Visit: Payer: Self-pay | Admitting: Nurse Practitioner

## 2012-12-03 ENCOUNTER — Encounter: Payer: Self-pay | Admitting: Nurse Practitioner

## 2012-12-03 ENCOUNTER — Telehealth: Payer: Self-pay | Admitting: Family Medicine

## 2012-12-03 ENCOUNTER — Ambulatory Visit (INDEPENDENT_AMBULATORY_CARE_PROVIDER_SITE_OTHER): Payer: BC Managed Care – PPO | Admitting: Nurse Practitioner

## 2012-12-03 VITALS — BP 128/82 | Temp 98.3°F | Wt 286.6 lb

## 2012-12-03 DIAGNOSIS — G43909 Migraine, unspecified, not intractable, without status migrainosus: Secondary | ICD-10-CM

## 2012-12-03 DIAGNOSIS — J209 Acute bronchitis, unspecified: Secondary | ICD-10-CM

## 2012-12-03 DIAGNOSIS — J069 Acute upper respiratory infection, unspecified: Secondary | ICD-10-CM

## 2012-12-03 DIAGNOSIS — F418 Other specified anxiety disorders: Secondary | ICD-10-CM

## 2012-12-03 DIAGNOSIS — F341 Dysthymic disorder: Secondary | ICD-10-CM

## 2012-12-03 MED ORDER — SERTRALINE HCL 100 MG PO TABS: ORAL_TABLET | ORAL | Status: DC

## 2012-12-03 MED ORDER — PHENTERMINE HCL 37.5 MG PO CAPS: 37.5000 mg | ORAL_CAPSULE | ORAL | Status: DC

## 2012-12-03 MED ORDER — TOPIRAMATE ER 200 MG PO CAP24: 200.0000 mg | ORAL_CAPSULE | Freq: Every day | ORAL | Status: DC

## 2012-12-03 MED ORDER — AZITHROMYCIN 250 MG PO TABS: ORAL_TABLET | ORAL | Status: DC

## 2012-12-03 NOTE — Telephone Encounter (Signed)
[facility] not [facility] for the Antibiotic that Eber Jones said she was going to call in for [place] after her appt today. Please call pt when done

## 2012-12-03 NOTE — Assessment & Plan Note (Signed)
Continue phentermine as directed. Recheck in 3 months.

## 2012-12-03 NOTE — Assessment & Plan Note (Signed)
Continue Topamax as directed.

## 2012-12-03 NOTE — Assessment & Plan Note (Signed)
Continue Zoloft as directed.

## 2012-12-03 NOTE — Progress Notes (Signed)
Subjective:  Presents for recheck. Doing well on phentermine. No shortness of breath or insomnia. Would like to continue. Also needs refills on her regular medications which are working well. Has no plans for pregnancy at this point. Also complaints of frequent cough over the past week. Nonproductive. No wheezing. Green-yellow nasal drainage. Low-grade fever at night Max temp 101. General malaise. No headache. Sore throat and ear pain.  Objective:   BP 128/82  Temp(Src) 98.3 F (36.8 C) (Oral)  Wt 286 lb 9.6 oz (130.001 kg) NAD. Alert, oriented. TMs retracted bilateral, no erythema. Pharynx mildly injected with PND noted. Neck supple with mild soft nontender adenopathy. Lungs 1 faint expiratory wheeze noted left side posterior, scattered faint expiratory crackles noted mainly posterior. Heart regular rate rhythm.  Assessment:Depression with anxiety  Morbid obesity  Migraines  Acute upper respiratory infection  Acute bronchitis  Meds ordered this encounter  Medications  . DISCONTD: topiramate (TOPAMAX) 100 MG tablet    Sig: Take 200 mg by mouth daily.  . phentermine 37.5 MG capsule    Sig: Take 1 capsule (37.5 mg total) by mouth every morning.    Dispense:  30 capsule    Refill:  2    Order Specific Question:  Supervising Provider    Answer:  Merlyn Albert [[id number]]  . sertraline (ZOLOFT) 100 MG tablet    Sig: One and one half tabs daily    Dispense:  45 tablet    Refill:  5    Order Specific Question:  Supervising Provider    Answer:  Merlyn Albert [[id number]]  . Topiramate ER 200 MG CP24    Sig: Take 200 mg by mouth daily.    Dispense:  30 capsule    Refill:  5    Order Specific Question:  Supervising Provider    Answer:  Merlyn Albert [[id number]]   Encouraged healthy diet and regular activity. Call back next week if no improvement in congestion. Otherwise recheck in 3 months.

## 2012-12-14 ENCOUNTER — Other Ambulatory Visit: Payer: Self-pay | Admitting: Family Medicine

## 2012-12-22 ENCOUNTER — Other Ambulatory Visit: Payer: Self-pay | Admitting: *Deleted

## 2012-12-22 ENCOUNTER — Telehealth: Payer: Self-pay | Admitting: Family Medicine

## 2012-12-22 MED ORDER — ONDANSETRON HCL 8 MG PO TABS: 8.0000 mg | ORAL_TABLET | Freq: Three times a day (TID) | ORAL | Status: DC | PRN

## 2012-12-22 MED ORDER — TRAMADOL HCL 50 MG PO TABS: 50.0000 mg | ORAL_TABLET | Freq: Three times a day (TID) | ORAL | Status: DC | PRN

## 2012-12-22 NOTE — Telephone Encounter (Signed)
Pt needs something for her migraines and something for nausea, she had had one for 3 days now. [facility]

## 2012-12-22 NOTE — Telephone Encounter (Signed)
Tramadol 50 mg and zofran 8 [facility] [place] per Dr. Lorin Picket. Pt notified on her voicemail

## 2013-01-14 ENCOUNTER — Ambulatory Visit (INDEPENDENT_AMBULATORY_CARE_PROVIDER_SITE_OTHER): Payer: BC Managed Care – PPO | Admitting: Nurse Practitioner

## 2013-01-14 ENCOUNTER — Encounter: Payer: Self-pay | Admitting: Nurse Practitioner

## 2013-01-14 VITALS — BP 130/86 | Temp 97.7°F | Wt 289.2 lb

## 2013-01-14 DIAGNOSIS — N10 Acute tubulo-interstitial nephritis: Secondary | ICD-10-CM

## 2013-01-14 DIAGNOSIS — R300 Dysuria: Secondary | ICD-10-CM

## 2013-01-14 LAB — POCT URINALYSIS DIPSTICK
Blood, UA: 10
Protein, UA: 5
Spec Grav, UA: 1.025

## 2013-01-14 MED ORDER — OXYCODONE-ACETAMINOPHEN 5-325 MG PO TABS: 1.0000 | ORAL_TABLET | ORAL | Status: DC | PRN

## 2013-01-14 MED ORDER — CEFTRIAXONE SODIUM 1 G IJ SOLR
500.0000 mg | Freq: Once | INTRAMUSCULAR | Status: AC
  Administered 2013-01-14: 500 mg via INTRAMUSCULAR

## 2013-01-14 MED ORDER — CIPROFLOXACIN HCL 500 MG PO TABS: 500.0000 mg | ORAL_TABLET | Freq: Two times a day (BID) | ORAL | Status: DC

## 2013-01-16 ENCOUNTER — Encounter: Payer: Self-pay | Admitting: Nurse Practitioner

## 2013-01-16 DIAGNOSIS — N10 Acute tubulo-interstitial nephritis: Secondary | ICD-10-CM | POA: Insufficient documentation

## 2013-01-16 LAB — POCT UA - MICROSCOPIC ONLY

## 2013-01-16 NOTE — Progress Notes (Signed)
Subjective:  Presents for complaints of possible UTI that began 3 days ago. Eased up at one point and then symptoms started back again last night. Complaints of low-grade fever dysuria cloudy urine with a bad smell. Nausea but no vomiting. Some constipation but she states this is normal for her. Same sexual partner. No vaginal discharge. Denies any possibility of pregnancy. Had pyelonephritis in March. Throbbing pain in the right CVA/flank area. No obvious blood in her urine.  Objective:   BP 130/86  Temp(Src) 97.7 F (36.5 C) (Oral)  Wt 289 lb 3.2 oz (131.18 kg) NAD. Alert, oriented. Lungs clear. Heart regular rate rhythm. Positive right CVA/flank tenderness on exam radiating towards the right middle abdominal area. Abdomen soft nondistended with mild suprapubic area tenderness. Urine microscopic occasional WBC, occasional bacteria with multiple epi cells noted.  Assessment:Acute pyelonephritis - Plan: cefTRIAXone (ROCEPHIN) injection 500 mg  Dysuria - Plan: POCT urinalysis dipstick, POCT UA - Microscopic Only, oxyCODONE-acetaminophen (PERCOCET/ROXICET) 5-325 MG per tablet, GC/chlamydia probe amp, urine  Pelvic pain  Plan: Meds ordered this encounter  Medications  . ciprofloxacin (CIPRO) 500 MG tablet    Sig: Take 1 tablet (500 mg total) by mouth 2 (two) times daily. Start 8/1 AM    Dispense:  14 tablet    Refill:  0    Order Specific Question:  Supervising Provider    Answer:  Merlyn Albert [[id number]]  . DISCONTD: oxyCODONE-acetaminophen (PERCOCET/ROXICET) 5-325 MG per tablet    Sig: Take 1 tablet by mouth every 4 (four) hours as needed for pain.    Dispense:  20 tablet    Refill:  0    Has taken Percocet without difficulty    Order Specific Question:  Supervising Provider    Answer:  Merlyn Albert [[id number]]  . cefTRIAXone (ROCEPHIN) injection 500 mg    Sig:   . oxyCODONE-acetaminophen (PERCOCET/ROXICET) 5-325 MG per tablet    Sig: Take 1 tablet by mouth every 4 (four) hours as  needed for pain.    Dispense:  20 tablet    Refill:  0    Has taken Percocet without difficulty    Order Specific Question:  Supervising Provider    Answer:  Merlyn Albert [[id number]]   Increase fluid intake. Call back in 48 hours if no improvement in symptoms, call or go to the ED sooner if worse. No urine culture due to poor sample.

## 2013-01-16 NOTE — Assessment & Plan Note (Signed)
Plan: Meds ordered this encounter  Medications  . ciprofloxacin (CIPRO) 500 MG tablet    Sig: Take 1 tablet (500 mg total) by mouth 2 (two) times daily. Start 8/1 AM    Dispense:  14 tablet    Refill:  0    Order Specific Question:  Supervising Provider    Answer:  Merlyn Albert [[id number]]  . DISCONTD: oxyCODONE-acetaminophen (PERCOCET/ROXICET) 5-325 MG per tablet    Sig: Take 1 tablet by mouth every 4 (four) hours as needed for pain.    Dispense:  20 tablet    Refill:  0    Has taken Percocet without difficulty    Order Specific Question:  Supervising Provider    Answer:  Merlyn Albert [[id number]]  . cefTRIAXone (ROCEPHIN) injection 500 mg    Sig:   . oxyCODONE-acetaminophen (PERCOCET/ROXICET) 5-325 MG per tablet    Sig: Take 1 tablet by mouth every 4 (four) hours as needed for pain.    Dispense:  20 tablet    Refill:  0    Has taken Percocet without difficulty    Order Specific Question:  Supervising Provider    Answer:  Merlyn Albert [[id number]]   Increase fluid intake. Call back in 48 hours if no improvement in symptoms, call or go to the ED sooner if worse. No urine culture due to poor sample.

## 2013-02-22 ENCOUNTER — Other Ambulatory Visit: Payer: Self-pay | Admitting: Nurse Practitioner

## 2013-02-22 ENCOUNTER — Other Ambulatory Visit: Payer: Self-pay | Admitting: Family Medicine

## 2013-02-24 ENCOUNTER — Encounter: Payer: Self-pay | Admitting: Family Medicine

## 2013-02-24 ENCOUNTER — Ambulatory Visit (INDEPENDENT_AMBULATORY_CARE_PROVIDER_SITE_OTHER): Payer: BC Managed Care – PPO | Admitting: Family Medicine

## 2013-02-24 VITALS — BP 142/88 | Temp 98.4°F | Ht 72.0 in | Wt 285.0 lb

## 2013-02-24 DIAGNOSIS — M25561 Pain in right knee: Secondary | ICD-10-CM

## 2013-02-24 DIAGNOSIS — M25569 Pain in unspecified knee: Secondary | ICD-10-CM

## 2013-02-24 MED ORDER — CEFPROZIL 500 MG PO TABS: 500.0000 mg | ORAL_TABLET | Freq: Two times a day (BID) | ORAL | Status: DC

## 2013-02-24 MED ORDER — SERTRALINE HCL 100 MG PO TABS: 200.0000 mg | ORAL_TABLET | Freq: Every day | ORAL | Status: DC

## 2013-02-24 MED ORDER — PREDNISONE 20 MG PO TABS: ORAL_TABLET | ORAL | Status: DC

## 2013-02-24 MED ORDER — MELOXICAM 15 MG PO TABS: 15.0000 mg | ORAL_TABLET | Freq: Every day | ORAL | Status: DC

## 2013-02-24 NOTE — Patient Instructions (Signed)
For knee: prednisone 20 mg- 3qd for 3d then 2qd for 3d then 1qd for 3d  Then start Mobic 15mg  one daily ( no other NSAIDS )   Recheck here in 4 weeks  Cefzil 500 one 2 times ad ay for 10 days

## 2013-02-24 NOTE — Progress Notes (Signed)
  Subjective:    Patient ID: Andrea [name], female    DOB: 21-Jun-1980, 32 y.o.   MRN: [id number]  HPIHaving left knee pain and swelling for 5 days.   Having sinus drainage, cough, and fever since yesterday. Working full time and every other weekend   Review of Systems Not locking No history of Tried icing, ibuprofen without help.    Objective:   Physical Exam On examination the knee is not locking there is some medial and lateral tenderness. No swelling noted. Calf is normal ankle normal.       Assessment & Plan:  Change NSAIDS to mobic 15 qd If ongoing ortho Prednisone taper

## 2013-03-03 ENCOUNTER — Other Ambulatory Visit: Payer: Self-pay | Admitting: Family Medicine

## 2013-03-03 ENCOUNTER — Telehealth: Payer: Self-pay | Admitting: Family Medicine

## 2013-03-03 DIAGNOSIS — M25562 Pain in left knee: Secondary | ICD-10-CM

## 2013-03-03 NOTE — Telephone Encounter (Signed)
Pt calling about her knee as per your request, stating that is still swollen and painful to walk on it. Meds don't seem to be helping.

## 2013-03-03 NOTE — Telephone Encounter (Signed)
We will go ahead and set up this patient with orthopedics. I will send in the referral to our referral specialist and we will try to get the appointment as soon as possible

## 2013-03-03 NOTE — Telephone Encounter (Signed)
Patient notified

## 2013-03-05 ENCOUNTER — Ambulatory Visit: Payer: BC Managed Care – PPO | Admitting: Family Medicine

## 2013-03-05 ENCOUNTER — Ambulatory Visit: Payer: BC Managed Care – PPO | Admitting: Nurse Practitioner

## 2013-03-08 ENCOUNTER — Ambulatory Visit (INDEPENDENT_AMBULATORY_CARE_PROVIDER_SITE_OTHER): Payer: BC Managed Care – PPO | Admitting: Nurse Practitioner

## 2013-03-08 ENCOUNTER — Encounter: Payer: Self-pay | Admitting: Nurse Practitioner

## 2013-03-08 ENCOUNTER — Encounter: Payer: Self-pay | Admitting: Family Medicine

## 2013-03-08 VITALS — BP 132/80 | Temp 98.3°F | Ht 72.0 in | Wt 288.0 lb

## 2013-03-08 DIAGNOSIS — L0390 Cellulitis, unspecified: Secondary | ICD-10-CM

## 2013-03-08 DIAGNOSIS — L0291 Cutaneous abscess, unspecified: Secondary | ICD-10-CM

## 2013-03-08 DIAGNOSIS — R300 Dysuria: Secondary | ICD-10-CM

## 2013-03-08 MED ORDER — CLINDAMYCIN HCL 300 MG PO CAPS: 300.0000 mg | ORAL_CAPSULE | Freq: Three times a day (TID) | ORAL | Status: DC

## 2013-03-08 MED ORDER — OXYCODONE-ACETAMINOPHEN 5-325 MG PO TABS: 1.0000 | ORAL_TABLET | ORAL | Status: DC | PRN

## 2013-03-10 ENCOUNTER — Encounter: Payer: Self-pay | Admitting: Family Medicine

## 2013-03-10 ENCOUNTER — Ambulatory Visit (INDEPENDENT_AMBULATORY_CARE_PROVIDER_SITE_OTHER): Payer: BC Managed Care – PPO | Admitting: Family Medicine

## 2013-03-10 VITALS — BP 122/88 | Ht 72.0 in | Wt 285.0 lb

## 2013-03-10 DIAGNOSIS — L0390 Cellulitis, unspecified: Secondary | ICD-10-CM

## 2013-03-10 DIAGNOSIS — L0291 Cutaneous abscess, unspecified: Secondary | ICD-10-CM

## 2013-03-10 MED ORDER — TRAMADOL HCL 50 MG PO TABS: 50.0000 mg | ORAL_TABLET | Freq: Four times a day (QID) | ORAL | Status: DC | PRN

## 2013-03-10 NOTE — Progress Notes (Signed)
  Subjective:    Patient ID: Andrea [name], female    DOB: February 03, 1981, 32 y.o.   MRN: [id number]  HPIrecheck right foot. Pt was seen last Monday for blister on right foot She is taking antibiotics and a culture was done.  is on antibiotics doing well with this. Still relates pain unable to walk on it needs a request for work. Denies any other problems no high fever wheezing or difficulty breathing.   Review of Systems See above    Objective:   Physical Exam  Mild cellulitis at the back of the heel it seems to be healing there is no sign of any type of abscess colon on culture did come back showing that it was staph aureus      Assessment & Plan:  Staph cellulitis continue antibiotics warm compresses tramadol for pain minimize walking on it should gradually get better over the next 7 days return back to work next Monday. Call us if any warning signs occur

## 2013-03-11 ENCOUNTER — Encounter: Payer: Self-pay | Admitting: Nurse Practitioner

## 2013-03-11 LAB — WOUND CULTURE

## 2013-03-11 NOTE — Progress Notes (Signed)
Subjective:  Presents for c/o right heel pain x 3 days. Started off as a blister on right heel which ruptured. Since then has developed swelling and tenderness at the lateral malleolus with some tenderness in area of achilles tendon.  Tenderness with walking. No fever.  Objective:   BP 132/80  Temp(Src) 98.3 F (36.8 C) (Oral)  Ht 6' (1.829 m)  Wt 288 lb (130.636 kg)  BMI 39.05 kg/m2 NAD. Alert, oriented. Lungs clear. Heart regular rhythm. A superficial eroded areas noted on the right heel area, erythematous and dry. A sterile culture was obtained with a moistened swab. Moderate edema with faint pink color noted around the lateral foot. Tender to palpation. Tenderness also noted in the area of the Achilles tendon, no edema or erythema noted. Calf circumference right side 17-1/2 inches left side 17 3/4 inches. Gait slight limp noted.  Assessment:Cellulitis - Plan: Wound culture  Dysuria - Plan: DISCONTINUED: oxyCODONE-acetaminophen (PERCOCET/ROXICET) 5-325 MG per tablet  Plan: Meds ordered this encounter  Medications  . DISCONTD: oxyCODONE-acetaminophen (PERCOCET/ROXICET) 5-325 MG per tablet    Sig: Take 1 tablet by mouth every 4 (four) hours as needed for pain.    Dispense:  30 tablet    Refill:  0    Has taken Percocet without difficulty    Order Specific Question:  Supervising Provider    Answer:  Merlyn Albert [[id number]]  . clindamycin (CLEOCIN) 300 MG capsule    Sig: Take 1 capsule (300 mg total) by mouth 3 (three) times daily.    Dispense:  30 capsule    Refill:  0    Order Specific Question:  Supervising Provider    Answer:  Merlyn Albert [[id number]]   Note patient has taken Percocet without difficulty in the past. Warm compresses. Elevation. Warning signs reviewed. Recheck in 48 hours, call back sooner if symptoms worsen.

## 2013-03-17 ENCOUNTER — Encounter: Payer: Self-pay | Admitting: Nurse Practitioner

## 2013-03-17 ENCOUNTER — Encounter: Payer: Self-pay | Admitting: Family Medicine

## 2013-03-17 ENCOUNTER — Other Ambulatory Visit: Payer: Self-pay | Admitting: Nurse Practitioner

## 2013-03-17 MED ORDER — CLINDAMYCIN HCL 300 MG PO CAPS: 300.0000 mg | ORAL_CAPSULE | Freq: Three times a day (TID) | ORAL | Status: DC

## 2013-03-18 ENCOUNTER — Telehealth: Payer: Self-pay | Admitting: Family Medicine

## 2013-03-18 MED ORDER — ALPRAZOLAM 1 MG PO TABS: ORAL_TABLET | ORAL | Status: DC

## 2013-03-18 NOTE — Telephone Encounter (Signed)
Patient would like her alprazolam called in to [facility]. She said she spoke with Dr Lorin Picket about this via email.

## 2013-03-18 NOTE — Telephone Encounter (Signed)
Script signed and faxed to pharmacy. Patient was notified.

## 2013-03-26 ENCOUNTER — Ambulatory Visit: Payer: BC Managed Care – PPO | Admitting: Nurse Practitioner

## 2013-03-29 ENCOUNTER — Telehealth: Payer: Self-pay | Admitting: Family Medicine

## 2013-03-29 MED ORDER — CIPROFLOXACIN HCL 500 MG PO TABS: 500.0000 mg | ORAL_TABLET | Freq: Two times a day (BID) | ORAL | Status: DC

## 2013-03-29 MED ORDER — FLUCONAZOLE 150 MG PO TABS: 150.0000 mg | ORAL_TABLET | Freq: Once | ORAL | Status: DC

## 2013-03-29 MED ORDER — TRAMADOL HCL 50 MG PO TABS: 50.0000 mg | ORAL_TABLET | Freq: Three times a day (TID) | ORAL | Status: DC | PRN

## 2013-03-29 NOTE — Telephone Encounter (Signed)
Left message on voicemail to return call.

## 2013-03-29 NOTE — Telephone Encounter (Signed)
NTC- clarify pain- as in dysuria?

## 2013-03-29 NOTE — Telephone Encounter (Signed)
cipro 500, one bid #14, diflucan 150 mg , i po times 1, tramadol 50 one tid prn pain #24, f/u if ongoing

## 2013-03-29 NOTE — Telephone Encounter (Signed)
Medication sent to pharmacy. Left message on voicemail notifying patient.  

## 2013-03-29 NOTE — Telephone Encounter (Signed)
Patient states that she is having burning with urination and slight pain in back and she thinks that tramadol is strong enough for this pain. She also wants an antibiotic and diflucan sent in.

## 2013-03-29 NOTE — Telephone Encounter (Signed)
Pt calling to say she has pain during urination, she thinks its another kidney stone. She can't sit still can't get comfortable it hurts really bad. Wants to know if we can call in something for the pain and an antibiotic. As well as Diflucan x2 for a yeast infection. [facility]

## 2013-04-02 ENCOUNTER — Ambulatory Visit (INDEPENDENT_AMBULATORY_CARE_PROVIDER_SITE_OTHER): Payer: BC Managed Care – PPO | Admitting: Family Medicine

## 2013-04-02 ENCOUNTER — Emergency Department (HOSPITAL_COMMUNITY)
Admission: EM | Admit: 2013-04-02 | Discharge: 2013-04-02 | Disposition: A | Discharge status: Home / Self Care | Payer: BC Managed Care – PPO | Attending: Emergency Medicine | Admitting: Emergency Medicine

## 2013-04-02 ENCOUNTER — Encounter (HOSPITAL_COMMUNITY): Payer: Self-pay | Admitting: Emergency Medicine

## 2013-04-02 ENCOUNTER — Emergency Department (HOSPITAL_COMMUNITY): Payer: BC Managed Care – PPO

## 2013-04-02 ENCOUNTER — Encounter: Payer: Self-pay | Admitting: Family Medicine

## 2013-04-02 VITALS — BP 134/94 | Temp 98.3°F | Ht 72.0 in | Wt 285.8 lb

## 2013-04-02 DIAGNOSIS — Z792 Long term (current) use of antibiotics: Secondary | ICD-10-CM | POA: Insufficient documentation

## 2013-04-02 DIAGNOSIS — R52 Pain, unspecified: Secondary | ICD-10-CM

## 2013-04-02 DIAGNOSIS — Z87442 Personal history of urinary calculi: Secondary | ICD-10-CM | POA: Insufficient documentation

## 2013-04-02 DIAGNOSIS — R109 Unspecified abdominal pain: Secondary | ICD-10-CM

## 2013-04-02 DIAGNOSIS — Z3202 Encounter for pregnancy test, result negative: Secondary | ICD-10-CM | POA: Insufficient documentation

## 2013-04-02 DIAGNOSIS — R1031 Right lower quadrant pain: Secondary | ICD-10-CM | POA: Insufficient documentation

## 2013-04-02 DIAGNOSIS — R110 Nausea: Secondary | ICD-10-CM | POA: Insufficient documentation

## 2013-04-02 DIAGNOSIS — Z79899 Other long term (current) drug therapy: Secondary | ICD-10-CM | POA: Insufficient documentation

## 2013-04-02 DIAGNOSIS — Z8719 Personal history of other diseases of the digestive system: Secondary | ICD-10-CM | POA: Insufficient documentation

## 2013-04-02 DIAGNOSIS — R509 Fever, unspecified: Secondary | ICD-10-CM | POA: Insufficient documentation

## 2013-04-02 DIAGNOSIS — R300 Dysuria: Secondary | ICD-10-CM | POA: Insufficient documentation

## 2013-04-02 DIAGNOSIS — Z8742 Personal history of other diseases of the female genital tract: Secondary | ICD-10-CM | POA: Insufficient documentation

## 2013-04-02 DIAGNOSIS — G43909 Migraine, unspecified, not intractable, without status migrainosus: Secondary | ICD-10-CM | POA: Insufficient documentation

## 2013-04-02 LAB — URINALYSIS, ROUTINE W REFLEX MICROSCOPIC
Glucose, UA: NEGATIVE mg/dL
Hgb urine dipstick: NEGATIVE
Leukocytes, UA: NEGATIVE
Protein, ur: NEGATIVE mg/dL
Urobilinogen, UA: 0.2 mg/dL (ref 0.0–1.0)
pH: 7.5 (ref 5.0–8.0)

## 2013-04-02 LAB — POCT PREGNANCY, URINE: Preg Test, Ur: NEGATIVE

## 2013-04-02 LAB — COMPREHENSIVE METABOLIC PANEL
ALT: 14 U/L (ref 0–35)
AST: 16 U/L (ref 0–37)
Albumin: 3.9 g/dL (ref 3.5–5.2)
BUN: 8 mg/dL (ref 6–23)
CO2: 26 mEq/L (ref 19–32)
Chloride: 106 mEq/L (ref 96–112)
Creatinine, Ser: 0.76 mg/dL (ref 0.50–1.10)
Potassium: 3.9 mEq/L (ref 3.5–5.1)
Sodium: 141 mEq/L (ref 135–145)
Total Bilirubin: 0.3 mg/dL (ref 0.3–1.2)

## 2013-04-02 LAB — CBC WITH DIFFERENTIAL/PLATELET
Basophils Absolute: 0 10*3/uL (ref 0.0–0.1)
Basophils Relative: 1 % (ref 0–1)
Lymphocytes Relative: 36 % (ref 12–46)
Lymphs Abs: 2.2 10*3/uL (ref 0.7–4.0)
MCHC: 33.2 g/dL (ref 30.0–36.0)
Monocytes Absolute: 0.4 10*3/uL (ref 0.1–1.0)
Neutro Abs: 3.4 10*3/uL (ref 1.7–7.7)
Neutrophils Relative %: 55 % (ref 43–77)
Platelets: 216 10*3/uL (ref 150–400)
RBC: 4.52 MIL/uL (ref 3.87–5.11)
RDW: 13.6 % (ref 11.5–15.5)

## 2013-04-02 MED ORDER — MORPHINE SULFATE 4 MG/ML IJ SOLN
4.0000 mg | Freq: Once | INTRAMUSCULAR | Status: AC
  Administered 2013-04-02: 4 mg via INTRAVENOUS
  Filled 2013-04-02: qty 1

## 2013-04-02 MED ORDER — ASPIRIN 325 MG PO TABS: 325.0000 mg | ORAL_TABLET | Freq: Once | ORAL | Status: DC

## 2013-04-02 MED ORDER — IOHEXOL 300 MG/ML  SOLN
100.0000 mL | Freq: Once | INTRAMUSCULAR | Status: AC | PRN
  Administered 2013-04-02: 100 mL via INTRAVENOUS

## 2013-04-02 MED ORDER — ONDANSETRON HCL 4 MG/2ML IJ SOLN
4.0000 mg | Freq: Once | INTRAMUSCULAR | Status: AC
  Administered 2013-04-02: 4 mg via INTRAVENOUS
  Filled 2013-04-02: qty 2

## 2013-04-02 MED ORDER — IOHEXOL 300 MG/ML  SOLN
25.0000 mL | INTRAMUSCULAR | Status: AC
  Administered 2013-04-02: 25 mL via ORAL

## 2013-04-02 NOTE — ED Notes (Signed)
Pt finished oral contrast.  CT notified.

## 2013-04-02 NOTE — Progress Notes (Signed)
  Subjective:    Patient ID: Andrea [name], female    DOB: Jun 18, 1980, 32 y.o.   MRN: [id number]  HPI Comments: Patient is on Cipro. She had kidney stones in the past. She was not able to void. She hasn't voided since last night. She is drinking plenty of fluids.   Abdominal Pain This is a recurrent problem. The current episode started in the past 7 days. The onset quality is sudden. The problem occurs constantly. The problem has been gradually worsening. The pain is located in the right flank. The quality of the pain is sharp. The abdominal pain does not radiate. Associated symptoms include dysuria. Exacerbated by: Constant. The pain is relieved by nothing. She has tried antibiotics for the symptoms.  steady pain, difficult time urinating, drinking a lot of water, painful with urination   Of note antibiotics called in earlier this week for presumed UTI. Patient has been taken. States that she is having more and more pain.  History of kidney stones. This pain feels very much like kidney stones in the past. Primarily right flank radiating to abdomen and on down to the pelvic region.   Review of Systems  Gastrointestinal: Positive for abdominal pain.  Genitourinary: Positive for dysuria.   No fever no chills    Objective:   Physical Exam  Alert significant distress. Unable to sit down. Lungs clear heart regular rate and rhythm. Mild right CVA tenderness. Significant right lateral abdomen and right lower quadrant tenderness. Bowel sounds present no rebound no guarding.  Patient unable to give urine specimen.      Assessment & Plan:  Impression acute abdominal and flank pain with history of prior stones in failure on oral antibiotics. Plan patient needs urgent workup including blood work scan cast urine et Karie Soda. Need to do this at the emergency room rationale discussed. May need admission to the hospital. Based on results. I called and spoke with the emergency room doctor. 25 minutes  spent most in discussion. [facility]

## 2013-04-02 NOTE — ED Provider Notes (Signed)
CSN: [id number]     Arrival date & time 04/02/13  1315 History   First MD Initiated Contact with Patient 04/02/13 1528     Chief Complaint  Patient presents with  . Abdominal Pain  . Dysuria   (Consider location/radiation/quality/duration/timing/severity/associated sxs/prior Treatment) Patient is a 32 y.o. female presenting with abdominal pain and dysuria. The history is provided by the patient.  Abdominal Pain Pain location:  RLQ and R flank Pain quality: sharp   Pain quality: not cramping and no fullness   Pain radiates to:  Groin Pain severity:  Severe Onset quality:  Gradual Duration:  7 days Timing:  Constant Progression:  Worsening Chronicity:  New Context: not eating, not laxative use, not recent illness, not recent sexual activity, not recent travel and not trauma   Relieved by:  Nothing Worsened by:  Nothing tried Ineffective treatments:  None tried Associated symptoms: dysuria, fever (101 Tmax) and nausea   Associated symptoms: no chest pain, no cough, no diarrhea, no shortness of breath, no vaginal bleeding, no vaginal discharge and no vomiting   Dysuria Associated symptoms: abdominal pain, fever (101 Tmax) and nausea   Associated symptoms: no vaginal discharge and no vomiting     Past Medical History  Diagnosis Date  . Kidney stones   . GERD (gastroesophageal reflux disease)   . Migraines   . PCOS (polycystic ovarian syndrome)    Past Surgical History  Procedure Laterality Date  . Cholecystectomy    . Lymph nodes removed     History reviewed. No pertinent family history. History  Substance Use Topics  . Smoking status: Never Smoker   . Smokeless tobacco: Not on file  . Alcohol Use: No   OB History   Grav Para Term Preterm Abortions TAB SAB Ect Mult Living                 Review of Systems  Constitutional: Positive for fever (101 Tmax).  Respiratory: Negative for cough and shortness of breath.   Cardiovascular: Negative for chest pain and leg  swelling.  Gastrointestinal: Positive for nausea and abdominal pain. Negative for vomiting, diarrhea and abdominal distention.  Genitourinary: Positive for dysuria. Negative for vaginal bleeding, vaginal discharge and vaginal pain.  All other systems reviewed and are negative.    Allergies  Vicodin  Home Medications   Current Outpatient Rx  Name  Route  Sig  Dispense  Refill  . ALPRAZolam (XANAX) 1 MG tablet      Take 1/2 tablet during the day and 1 tablet qhs prn   45 tablet   3   . chlorzoxazone (PARAFON) 500 MG tablet   Oral   Take 500 mg by mouth 3 (three) times daily as needed for muscle spasms.         . ciprofloxacin (CIPRO) 500 MG tablet   Oral   Take 1 tablet (500 mg total) by mouth 2 (two) times daily.   14 tablet   0   . clindamycin (CLEOCIN) 300 MG capsule   Oral   Take 1 capsule (300 mg total) by mouth 3 (three) times daily.   30 capsule   0   . CRANBERRY PO   Oral   Take 2 capsules by mouth 3 (three) times daily.         . naproxen (NAPROSYN) 500 MG tablet   Oral   Take 500 mg by mouth 2 (two) times daily as needed (pain).         Marland Kitchen  ondansetron (ZOFRAN) 8 MG tablet   Oral   Take 1 tablet (8 mg total) by mouth every 8 (eight) hours as needed for nausea.   20 tablet   1   . sertraline (ZOLOFT) 100 MG tablet   Oral   Take 2 tablets (200 mg total) by mouth daily.   60 tablet   3   . Topiramate ER 200 MG CP24   Oral   Take 200 mg by mouth daily.   30 capsule   5   . traMADol (ULTRAM) 50 MG tablet   Oral   Take 1 tablet (50 mg total) by mouth 3 (three) times daily as needed for pain.   24 tablet   0   . fluconazole (DIFLUCAN) 150 MG tablet   Oral   Take 1 tablet (150 mg total) by mouth once.   1 tablet   0    BP 117/64  Pulse 71  Temp(Src) 97.7 F (36.5 C) (Oral)  Resp 22  Ht 6' (1.829 m)  Wt 284 lb (128.822 kg)  BMI 38.51 kg/m2  SpO2 99%  LMP 03/17/2013 Physical Exam  Nursing note and vitals  reviewed. Constitutional: She is oriented to person, place, and time. She appears well-developed and well-nourished. No distress.  HENT:  Head: Normocephalic and atraumatic.  Eyes: EOM are normal. Pupils are equal, round, and reactive to light.  Neck: Normal range of motion. Neck supple.  Cardiovascular: Normal rate and regular rhythm.  Exam reveals no friction rub.   No murmur heard. Pulmonary/Chest: Effort normal and breath sounds normal. No respiratory distress. She has no wheezes. She has no rales.  Abdominal: Soft. She exhibits no distension. There is tenderness (severe RLQ, mild R CVA). There is guarding (RLQ). There is no rebound.  Musculoskeletal: Normal range of motion. She exhibits no edema.  Neurological: She is alert and oriented to person, place, and time.  Skin: No rash noted. She is not diaphoretic.    ED Course  Procedures (including critical care time) Labs Review Labs Reviewed  CBC WITH DIFFERENTIAL  COMPREHENSIVE METABOLIC PANEL  URINALYSIS, ROUTINE W REFLEX MICROSCOPIC  POCT PREGNANCY, URINE   Imaging Review Ct Abdomen Pelvis W Contrast  04/02/2013   CLINICAL DATA:  Right lower quadrant pain, right flank pain, nausea, vomiting  EXAM: CT ABDOMEN AND PELVIS WITH CONTRAST  TECHNIQUE: Multidetector CT imaging of the abdomen and pelvis was performed using the standard protocol following bolus administration of intravenous contrast.  CONTRAST:  OMNIPAQUE IOHEXOL 300 MG/ML  SOLN  COMPARISON:  11/26/2010  FINDINGS: Lung bases are clear. No effusions. Heart is normal size.  Prior cholecystectomy scattered calcifications in the spleen compatible with old granulomatous disease, stable. Liver, pancreas, adrenals and kidneys are unremarkable.  Normal retrocecal appendix. Large and small bowel are grossly unremarkable. No free fluid, free air or adenopathy.  Multiple cystic scratch have multiple small cystic areas within the right ovary. Left ovary and uterus unremarkable.  Urinary bladder is unremarkable. No acute bony abnormality.  IMPRESSION: Several small cystic areas within the right ovary, likely small cysts or follicles.  Normal appendix.  No renal or ureteral stones. No hydronephrosis.   Electronically Signed   By: Charlett Nose M.D.   On: 04/02/2013 17:31    EKG Interpretation   None       MDM   1. Abdominal pain    32 year old female here with right flank and right lower quadrant pain. Patient is single and off for one week. Stated  fever up to 101 yesterday. Her PCP sent here for concerns of appendicitis. She does have history of kidney stones, status pain does not feel similar to her prior kidney stones. She also takes Cipro earlier in the week as she does have a urinary tract infection. Here her urine is without blood. She has a normal white count normal remainder of her labs. She has mild right CVA tenderness on exam but severe right lower quadrant pain on palpation. I'm concerned about appendicitis. Abdominal CT without hydronephrosis, no appendicitis. Explained we cannot see stones, but without hematuria, no hydronephrosis, I feel stones are unlikely. Patient has tramadol at home. Stable for discharge.  I have reviewed all labs and imaging and considered them in my medical decision making.   Dagmar Hait, MD 04/02/13 2004

## 2013-04-02 NOTE — ED Notes (Signed)
Pt c/o right flank and side pain into back x 1 week with some dysuria; pt sent here by PCP for further eval

## 2013-04-05 ENCOUNTER — Encounter: Payer: Self-pay | Admitting: Family Medicine

## 2013-04-05 ENCOUNTER — Ambulatory Visit (INDEPENDENT_AMBULATORY_CARE_PROVIDER_SITE_OTHER): Payer: BC Managed Care – PPO | Admitting: Family Medicine

## 2013-04-05 ENCOUNTER — Telehealth: Payer: Self-pay | Admitting: Family Medicine

## 2013-04-05 VITALS — BP 130/92 | Temp 98.3°F | Ht 72.0 in | Wt 282.8 lb

## 2013-04-05 DIAGNOSIS — B9561 Methicillin susceptible Staphylococcus aureus infection as the cause of diseases classified elsewhere: Secondary | ICD-10-CM

## 2013-04-05 DIAGNOSIS — L738 Other specified follicular disorders: Secondary | ICD-10-CM

## 2013-04-05 DIAGNOSIS — A4901 Methicillin susceptible Staphylococcus aureus infection, unspecified site: Secondary | ICD-10-CM

## 2013-04-05 MED ORDER — MUPIROCIN 2 % EX OINT: TOPICAL_OINTMENT | CUTANEOUS | Status: DC

## 2013-04-05 MED ORDER — DOXYCYCLINE HYCLATE 100 MG PO CAPS: 100.0000 mg | ORAL_CAPSULE | Freq: Two times a day (BID) | ORAL | Status: DC

## 2013-04-05 NOTE — Telephone Encounter (Signed)
Left message on voicemail to return call.

## 2013-04-05 NOTE — Telephone Encounter (Signed)
Patient transferred to front desk to schedule appointment today.

## 2013-04-05 NOTE — Telephone Encounter (Signed)
Patient states she is still running a fever.  States the MRSA in her right foot is not healed, now she red bumps with discharge.  Please call the patient as soon as possible to discuss.

## 2013-04-05 NOTE — Progress Notes (Signed)
  Subjective:    Patient ID: Andrea [name], female    DOB: 08/15/1980, 32 y.o.   MRN: [id number]  HPI Patient is here today b/c she believes to have MRSA on her chest. It orginaly started on her right foot about 3 weeks ago. She now noticed small, tiny red, oozing bumps on her chest. She noticed this over the weekend.   She c/o fatigue, aches, and fever.  Her previous notes were reviewed in detail. Consistent with cellulitis positive MRSA patient is not toxic today Review of Systems No vomiting or diarrhea. No breathing difficulties. Some low-grade fevers.    Objective:   Physical Exam  Lungs are clear hearts regular pulse normal ankle where she had her original infection is somewhat puffy but no obvious sign of abscess or infection She has a couple folliculitis but nothing severe no severe cellulitis     Assessment & Plan:  Folliculitis due to staph infection cover for MRSA doxycycline twice a day for the next 2 weeks, Bactroban ointment to the nostrils once each evening for the next month, old that with a quarter cup of chlorine in it once or twice every month followup if ongoing troubles

## 2013-04-22 ENCOUNTER — Other Ambulatory Visit: Payer: Self-pay

## 2013-05-19 ENCOUNTER — Encounter: Payer: Self-pay | Admitting: Family Medicine

## 2013-05-19 ENCOUNTER — Ambulatory Visit (INDEPENDENT_AMBULATORY_CARE_PROVIDER_SITE_OTHER): Payer: BC Managed Care – PPO | Admitting: Family Medicine

## 2013-05-19 VITALS — BP 128/82 | Temp 98.3°F | Ht 72.0 in | Wt 292.6 lb

## 2013-05-19 DIAGNOSIS — J019 Acute sinusitis, unspecified: Secondary | ICD-10-CM

## 2013-05-19 MED ORDER — HYDROCODONE-HOMATROPINE 5-1.5 MG/5ML PO SYRP: 5.0000 mL | ORAL_SOLUTION | Freq: Four times a day (QID) | ORAL | Status: DC | PRN

## 2013-05-19 MED ORDER — LEVOFLOXACIN 500 MG PO TABS: 500.0000 mg | ORAL_TABLET | Freq: Every day | ORAL | Status: AC

## 2013-05-19 NOTE — Progress Notes (Signed)
   Subjective:    Patient ID: Andrea [name], female    DOB: October 10, 1980, 32 y.o.   MRN: [id number]  Cough This is a new problem. The current episode started in the past 7 days. Associated symptoms include a fever, nasal congestion and rhinorrhea. Pertinent negatives include no chest pain, ear pain, shortness of breath or wheezing. She has tried OTC cough suppressant for the symptoms.   Started 1 week ago in head first then chest.horase, wheeze, nausea, no V, 101 at night, chills as well pmh reviewed  Review of Systems  Constitutional: Positive for fever. Negative for activity change.  HENT: Positive for congestion and rhinorrhea. Negative for ear pain.   Eyes: Negative for discharge.  Respiratory: Positive for cough. Negative for shortness of breath and wheezing.   Cardiovascular: Negative for chest pain.   PMH benign    Objective:   Physical Exam  Nursing note and vitals reviewed. Constitutional: She appears well-developed.  HENT:  Head: Normocephalic.  Nose: Nose normal.  Mouth/Throat: Oropharynx is clear and moist. No oropharyngeal exudate.  Neck: Neck supple.  Cardiovascular: Normal rate and normal heart sounds.   No murmur heard. Pulmonary/Chest: Effort normal and breath sounds normal. She has no wheezes.  Lymphadenopathy:    She has no cervical adenopathy.  Skin: Skin is warm and dry.          Assessment & Plan:  Acute sinusitis antibiotics prescribed no need for any type of steroids followup at problems warnings discussed 15 minutes spent with patient

## 2013-05-23 ENCOUNTER — Other Ambulatory Visit: Payer: Self-pay | Admitting: Nurse Practitioner

## 2013-05-23 ENCOUNTER — Other Ambulatory Visit: Payer: Self-pay | Admitting: Family Medicine

## 2013-06-05 ENCOUNTER — Other Ambulatory Visit: Payer: Self-pay | Admitting: Nurse Practitioner

## 2013-06-24 ENCOUNTER — Other Ambulatory Visit: Payer: Self-pay | Admitting: Family Medicine

## 2013-06-25 ENCOUNTER — Other Ambulatory Visit: Payer: Self-pay | Admitting: Family Medicine

## 2013-07-02 ENCOUNTER — Ambulatory Visit (INDEPENDENT_AMBULATORY_CARE_PROVIDER_SITE_OTHER): Payer: BC Managed Care – PPO | Admitting: Nurse Practitioner

## 2013-07-02 ENCOUNTER — Encounter: Payer: Self-pay | Admitting: Family Medicine

## 2013-07-02 ENCOUNTER — Encounter: Payer: Self-pay | Admitting: Nurse Practitioner

## 2013-07-02 VITALS — BP 112/84 | Temp 98.2°F | Ht 72.0 in | Wt 290.0 lb

## 2013-07-02 DIAGNOSIS — J209 Acute bronchitis, unspecified: Secondary | ICD-10-CM

## 2013-07-02 DIAGNOSIS — J069 Acute upper respiratory infection, unspecified: Secondary | ICD-10-CM

## 2013-07-02 DIAGNOSIS — Z23 Encounter for immunization: Secondary | ICD-10-CM

## 2013-07-02 MED ORDER — AMOXICILLIN-POT CLAVULANATE 875-125 MG PO TABS: 1.0000 | ORAL_TABLET | Freq: Two times a day (BID) | ORAL | Status: DC

## 2013-07-04 ENCOUNTER — Encounter: Payer: Self-pay | Admitting: Nurse Practitioner

## 2013-07-04 NOTE — Progress Notes (Signed)
Subjective:  Presents for c/o cough and congestion that began 3 days ago. Fever. Sinus headache. Chest soreness with DB and cough. Frequent, nonproductive cough. Yellow green nasal drainage. Minimal wheeze, has not used inhaler. Ear pressure. No sore throat. No vomiting or diarrhea. No acid reflux or abdominal pain.Taking fluids well.  Objective:   BP 112/84  Temp(Src) 98.2 F (36.8 C) (Oral)  Ht 6' (1.829 m)  Wt 290 lb (131.543 kg)  BMI 39.32 kg/m2 NAD. Alert, oriented. TMs clear effusion. No erythema. Pharynx injected with PND. Neck supple with mild soft anterior adenopathy. Lungs: scattered faint expiratory crackles. No wheezing or tachypnea. Heart RRR.    Assessment: Acute upper respiratory infections of unspecified site  Acute bronchitis  Plan:  Meds ordered this encounter  Medications  . UNABLE TO FIND    Sig: Birth control pill  . amoxicillin-clavulanate (AUGMENTIN) 875-125 MG per tablet    Sig: Take 1 tablet by mouth 2 (two) times daily.    Dispense:  20 tablet    Refill:  0    Order Specific Question:  Supervising Provider    Answer:  Mikey Kirschner [[id number]]  reviewed symptomatic care and warning signs. Call back in 72 hours if no improvement, sooner if worse.

## 2013-07-06 ENCOUNTER — Telehealth: Payer: Self-pay | Admitting: Family Medicine

## 2013-07-06 MED ORDER — LEVOFLOXACIN 500 MG PO TABS: 500.0000 mg | ORAL_TABLET | Freq: Every day | ORAL | Status: AC

## 2013-07-06 NOTE — Telephone Encounter (Signed)
Pt seen on 07/02/13, told by Hoyle Sauer if she is not better to call back an she'll  Call in something stronger.   Was prescribed Augmentin on Friday  She is coughing so hard it looks like she is vomiting and nauseated to the point of  Not feeling like she can eat anything. Still running a fever of around 100 Body aches, headaches present   [employer]

## 2013-07-06 NOTE — Telephone Encounter (Signed)
Levaquin 500 10 days, follow up if ongoing

## 2013-07-06 NOTE — Telephone Encounter (Signed)
Rx sent electronically to [name].  Patient notified.

## 2013-07-13 ENCOUNTER — Telehealth: Payer: Self-pay | Admitting: Family Medicine

## 2013-07-13 NOTE — Telephone Encounter (Signed)
Patient says that she is wheezing really bad and having coughing spells where she cant catch her breath. She feels like she is not getting enough oxygen when she is wheezing/coughing. Please advise.   [employer]

## 2013-07-13 NOTE — Telephone Encounter (Signed)
Discussed with patient she needs to go directly to ED or urgent care. Pt agreed.

## 2013-07-27 ENCOUNTER — Other Ambulatory Visit: Payer: Self-pay | Admitting: *Deleted

## 2013-07-27 MED ORDER — ALPRAZOLAM 1 MG PO TABS: ORAL_TABLET | ORAL | Status: DC

## 2013-08-05 ENCOUNTER — Other Ambulatory Visit: Payer: Self-pay | Admitting: Family Medicine

## 2013-08-06 MED ORDER — TRAMADOL HCL 50 MG PO TABS: 50.0000 mg | ORAL_TABLET | Freq: Three times a day (TID) | ORAL | Status: DC | PRN

## 2013-08-06 NOTE — Telephone Encounter (Signed)
Ok times 3 

## 2013-08-20 ENCOUNTER — Encounter: Payer: Self-pay | Admitting: Nurse Practitioner

## 2013-08-20 ENCOUNTER — Ambulatory Visit (INDEPENDENT_AMBULATORY_CARE_PROVIDER_SITE_OTHER): Payer: BC Managed Care – PPO | Admitting: Nurse Practitioner

## 2013-08-20 VITALS — BP 124/80 | Temp 97.7°F | Ht 72.0 in | Wt 288.1 lb

## 2013-08-20 DIAGNOSIS — G43909 Migraine, unspecified, not intractable, without status migrainosus: Secondary | ICD-10-CM

## 2013-08-20 DIAGNOSIS — J069 Acute upper respiratory infection, unspecified: Secondary | ICD-10-CM

## 2013-08-20 MED ORDER — AMOXICILLIN-POT CLAVULANATE 875-125 MG PO TABS: 1.0000 | ORAL_TABLET | Freq: Two times a day (BID) | ORAL | Status: DC

## 2013-08-20 MED ORDER — OXYCODONE-ACETAMINOPHEN 5-325 MG PO TABS: 1.0000 | ORAL_TABLET | ORAL | Status: DC | PRN

## 2013-08-20 MED ORDER — SCOPOLAMINE 1 MG/3DAYS TD PT72: 1.0000 | MEDICATED_PATCH | TRANSDERMAL | Status: DC

## 2013-08-20 NOTE — Patient Instructions (Signed)
Meclizine 25 mg every 6 hours as needed   

## 2013-08-20 NOTE — Progress Notes (Signed)
   Subjective:    Patient ID: Andrea [name], female    DOB: August 20, 1980, 33 y.o.   MRN: [id number]  Sore Throat  This is a new problem. The current episode started in the past 7 days. The problem has been unchanged. Neither side of throat is experiencing more pain than the other. There has been no fever. The pain is moderate. Associated symptoms include headaches. She has tried NSAIDs and acetaminophen for the symptoms. The treatment provided no relief.   complaints of sore throat for the past week. No fever. Some chills and sweating. Cough worse at night, producing clear mucus. Runny nose. Has had a migraine headache for the past 3 days. Takes a rare Percocet, has run out of this. Ear pain/pressure. No vomiting diarrhea or abdominal pain. 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   Review of Systems  Neurological: Positive for headaches.       Objective:   Physical Exam NAD. Alert, oriented. Right TM clear effusion, no erythema. Left TM retracted, no erythema. Pharynx nonerythematous with faintly green PND noted. Neck supple with mild soft anterior adenopathy. Lungs clear. Heart regular rate rhythm.      Assessment & Plan:   Problem List Items Addressed This Visit     Cardiovascular and Mediastinum   Migraines (Chronic) exacerbation    Relevant Medications      oxyCODONE-acetaminophen (PERCOCET/ROXICET) 5-325 MG per tablet    Other Visit Diagnoses   Acute upper respiratory infections of unspecified site    -  Primary       Plan: Meds ordered this encounter  Medications  . oxyCODONE-acetaminophen (PERCOCET/ROXICET) 5-325 MG per tablet    Sig: Take 1 tablet by mouth every 4 (four) hours as needed for severe pain.    Dispense:  30 tablet    Refill:  0    Order Specific Question:  Supervising Provider    Answer:  Mikey Kirschner [[id number]]  . amoxicillin-clavulanate (AUGMENTIN)  875-125 MG per tablet    Sig: Take 1 tablet by mouth 2 (two) times daily.    Dispense:  20 tablet    Refill:  0    Order Specific Question:  Supervising Provider    Answer:  Mikey Kirschner [[id number]]  . scopolamine (TRANSDERM-SCOP) 1.5 MG    Sig: Place 1 patch (1.5 mg total) onto the skin every 3 (three) days.    Dispense:  4 patch    Refill:  2    Order Specific Question:  Supervising Provider    Answer:  Mikey Kirschner [[id number]]   continue OTC meds as directed for congestion. Trial patches or OTC meclizine as directed for motion sickness. Call back if symptoms worsen or persist.

## 2013-09-06 ENCOUNTER — Encounter: Payer: Self-pay | Admitting: Family Medicine

## 2013-09-06 ENCOUNTER — Ambulatory Visit (INDEPENDENT_AMBULATORY_CARE_PROVIDER_SITE_OTHER): Payer: BC Managed Care – PPO | Admitting: Nurse Practitioner

## 2013-09-06 ENCOUNTER — Encounter: Payer: Self-pay | Admitting: Nurse Practitioner

## 2013-09-06 VITALS — BP 130/84 | Temp 97.4°F | Ht 72.0 in | Wt 285.1 lb

## 2013-09-06 DIAGNOSIS — J300 Vasomotor rhinitis: Secondary | ICD-10-CM

## 2013-09-06 DIAGNOSIS — L0291 Cutaneous abscess, unspecified: Secondary | ICD-10-CM

## 2013-09-06 DIAGNOSIS — R109 Unspecified abdominal pain: Secondary | ICD-10-CM

## 2013-09-06 DIAGNOSIS — J309 Allergic rhinitis, unspecified: Secondary | ICD-10-CM

## 2013-09-06 DIAGNOSIS — L0390 Cellulitis, unspecified: Secondary | ICD-10-CM

## 2013-09-06 MED ORDER — SULFAMETHOXAZOLE-TMP DS 800-160 MG PO TABS: ORAL_TABLET | ORAL | Status: DC

## 2013-09-09 ENCOUNTER — Encounter: Payer: Self-pay | Admitting: Nurse Practitioner

## 2013-09-09 ENCOUNTER — Telehealth: Payer: Self-pay | Admitting: Family Medicine

## 2013-09-09 NOTE — Progress Notes (Signed)
Subjective:  Presents for several issues. Has had sinus symptoms for the past 3 days. Nonproductive cough. Runny nose. Ear pressure. No sore throat. No wheezing. Also complaints of nausea, no vomiting. Slight diarrhea only after eating. No abdominal pain. No headache. Decrease urination for the past 2 days. Drinking adequate fluids. No urgency frequency or dysuria. Mild tenderness around the right flank/CVA area. No hematuria. No vaginal discharge. No pelvic pain. No new sexual partners. No recent UTI. Complaints of foot pain for the past 3 days. Noticed a knot on the ball of the right foot which has ruptured, infectious material noted. Patient had MRSA in this foot back in September.  Objective:   BP 130/84  Temp(Src) 97.4 F (36.3 C)  Ht 6' (1.829 m)  Wt 285 lb 2 oz (129.332 kg)  BMI 38.66 kg/m2 NAD. Alert, oriented. TMs clear effusion, no erythema. Nasal mucosa pale and boggy. Pharynx injected. Neck supple with mild soft anterior adenopathy. Lungs clear. Heart regular rate rhythm. Abdomen soft nondistended nontender. Very mild right CVA tenderness noted going into the flank area, no discomfort beyond anterior axillary line. Skin is clear. Unable to give a urine sample while in the office. Firm small mass noted in the sole of the right foot just beneath the toes slightly warm, tender to palpation. A small open areas noted, no active drainage.  Assessment:Vasomotor rhinitis  Right flank pain  Cellulitis  Plan: Meds ordered this encounter  Medications  . sulfamethoxazole-trimethoprim (BACTRIM DS) 800-160 MG per tablet    Sig: One po BID    Dispense:  20 tablet    Refill:  0    Order Specific Question:  Supervising Provider    Answer:  Mikey Kirschner [[id number]]   warm soaks to the right foot. Warning signs reviewed. OTC meds as directed for head congestion. Call back in 72 hours if no improvement, sooner if worse.

## 2013-09-09 NOTE — Telephone Encounter (Signed)
Patient says that she is still running a fever from her appointment on Monday and she is itching all over. She thinks it could be the antibiotic, but unsure. Please advise.   [place]

## 2013-09-13 ENCOUNTER — Telehealth: Payer: Self-pay | Admitting: *Deleted

## 2013-09-13 NOTE — Telephone Encounter (Signed)
Stop antibiotic. Please forward message to her MD. Thanks.

## 2013-09-13 NOTE — Telephone Encounter (Signed)
Left message to return call to get more info.  Pt left on questionaire that she was having itching and hives after taking bactrim ds. Seen on 03/23 by carolyn. Diagnosed with  Vasomotor rhinitis.

## 2013-09-14 ENCOUNTER — Other Ambulatory Visit: Payer: Self-pay | Admitting: *Deleted

## 2013-09-14 MED ORDER — DOXYCYCLINE HYCLATE 100 MG PO TABS: 100.0000 mg | ORAL_TABLET | Freq: Two times a day (BID) | ORAL | Status: DC

## 2013-09-14 NOTE — Telephone Encounter (Signed)
Notified patient via VM stating we sent in the new med. 

## 2013-09-14 NOTE — Telephone Encounter (Signed)
See other phone message with doctor response.

## 2013-09-14 NOTE — Telephone Encounter (Signed)
Patient seen Andrea [name] for draining abscess on foot with history of MRSA and given Bactrim which caused itching

## 2013-09-14 NOTE — Telephone Encounter (Signed)
Patient stated." My foot still has a spot on the bottom where it has not healed and the top is still very tender and swollen. I stopped the Bactrim when I noticed the hives and itching became worse on last Thursday."

## 2013-09-14 NOTE — Telephone Encounter (Signed)
Doxy 100 bid ten d 

## 2013-09-21 ENCOUNTER — Other Ambulatory Visit: Payer: Self-pay | Admitting: Family Medicine

## 2013-09-21 NOTE — Telephone Encounter (Signed)
Patient may have 30 day supply of medications one refill each, Parafon is 3 times a day as needed cautioned drowsiness, Naprosyn 500 twice a day when necessary

## 2013-09-22 MED ORDER — CHLORZOXAZONE 500 MG PO TABS: ORAL_TABLET | ORAL | Status: DC

## 2013-09-22 MED ORDER — NAPROXEN 500 MG PO TABS: ORAL_TABLET | ORAL | Status: DC

## 2013-10-02 ENCOUNTER — Other Ambulatory Visit: Payer: Self-pay | Admitting: Family Medicine

## 2013-10-04 NOTE — Telephone Encounter (Signed)
Last seen 09/06/13 

## 2013-10-06 ENCOUNTER — Other Ambulatory Visit: Payer: Self-pay

## 2013-10-06 ENCOUNTER — Telehealth: Payer: Self-pay | Admitting: Family Medicine

## 2013-10-06 MED ORDER — TOPIRAMATE ER 200 MG PO CAP24: ORAL_CAPSULE | ORAL | Status: DC

## 2013-10-06 NOTE — Telephone Encounter (Signed)
Ok may do 4 refills

## 2013-10-06 NOTE — Telephone Encounter (Signed)
Patient needs Rx for trokendi XR 200 mg capsules, 1 by mouth daily.

## 2013-10-06 NOTE — Telephone Encounter (Signed)
Last seen 09/06/13

## 2013-10-06 NOTE — Telephone Encounter (Signed)
Done

## 2013-10-08 NOTE — Telephone Encounter (Signed)
Clarify, allergy to it? If not refill times 6, let pt know generic cheeaper but it is not ER. Her choice

## 2013-10-08 NOTE — Telephone Encounter (Signed)
Medication has already been refilled per patient. She just picked it up from the pharmacy. Patient is not allergic to this she states.

## 2013-10-25 ENCOUNTER — Ambulatory Visit (INDEPENDENT_AMBULATORY_CARE_PROVIDER_SITE_OTHER): Payer: BC Managed Care – PPO | Admitting: Family Medicine

## 2013-10-25 ENCOUNTER — Encounter: Payer: Self-pay | Admitting: Family Medicine

## 2013-10-25 VITALS — BP 126/88 | Temp 98.0°F | Ht 72.0 in | Wt 286.2 lb

## 2013-10-25 DIAGNOSIS — J019 Acute sinusitis, unspecified: Secondary | ICD-10-CM

## 2013-10-25 MED ORDER — AZITHROMYCIN 250 MG PO TABS: ORAL_TABLET | ORAL | Status: DC

## 2013-10-25 MED ORDER — ALBUTEROL SULFATE HFA 108 (90 BASE) MCG/ACT IN AERS: 2.0000 | INHALATION_SPRAY | Freq: Four times a day (QID) | RESPIRATORY_TRACT | Status: DC | PRN

## 2013-10-25 MED ORDER — HYDROCOD POLST-CHLORPHEN POLST 10-8 MG/5ML PO LQCR: 5.0000 mL | Freq: Every evening | ORAL | Status: DC | PRN

## 2013-10-25 NOTE — Progress Notes (Signed)
   Subjective:    Patient ID: Andrea [name], female    DOB: 1981-02-21, 33 y.o.   MRN: [id number]  Cough This is a new problem. The current episode started in the past 7 days. The problem has been gradually worsening. The problem occurs constantly. The cough is non-productive. Associated symptoms include chills, a fever, rhinorrhea, shortness of breath and wheezing. Pertinent negatives include no chest pain or ear pain. Nothing aggravates the symptoms. Treatments tried: mucinex. The treatment provided no relief.   Patient states she has no other concerns at this time.  Thought it was allergies Barking cough Loss of voice Some wheeze  Review of Systems  Constitutional: Positive for fever, chills and appetite change (poor ). Negative for activity change.  HENT: Positive for congestion and rhinorrhea. Negative for ear pain.   Eyes: Negative for discharge.  Respiratory: Positive for cough, shortness of breath and wheezing.   Cardiovascular: Negative for chest pain.  Gastrointestinal: Negative for nausea, vomiting and abdominal pain.       Objective:   Physical Exam  Nursing note and vitals reviewed. Constitutional: She appears well-developed.  HENT:  Head: Normocephalic.  Nose: Nose normal.  Mouth/Throat: Oropharynx is clear and moist. No oropharyngeal exudate.  Neck: Neck supple.  Cardiovascular: Normal rate and normal heart sounds.   No murmur heard. Pulmonary/Chest: Effort normal. She has wheezes.  Lymphadenopathy:    She has no cervical adenopathy.  Skin: Skin is warm and dry.          Assessment & Plan:  #1 bronchitis-possible atypical Zithromax as prescribed #2 upper respiratory illness viral with secondary infection #3 intermittent reactive airway albuterol #4 patient states Tussionex does help with her cough she denies any allergy with it 1 teaspoon each bedtime

## 2013-11-01 ENCOUNTER — Telehealth: Payer: Self-pay | Admitting: Family Medicine

## 2013-11-01 MED ORDER — LEVOFLOXACIN 500 MG PO TABS: 500.0000 mg | ORAL_TABLET | Freq: Every day | ORAL | Status: AC

## 2013-11-01 MED ORDER — PREDNISONE 20 MG PO TABS: ORAL_TABLET | ORAL | Status: DC

## 2013-11-01 NOTE — Telephone Encounter (Signed)
Zpak and Tussionex was prescribed

## 2013-11-01 NOTE — Telephone Encounter (Signed)
Patient is wheezing really bad now and has a cough. She said she was told to call back if she wasn't feeling any better and we would call her in something. Was seen on 10/25/13 for sinusitis. [facility]

## 2013-11-01 NOTE — Telephone Encounter (Signed)
NTC- review Sx. If severe SOB then needs OV or ER. Otherwise. Levaquin 500 qd for 7d and Pred 20mg , 3qd for 3d then 2qd for 3d then 1qd for 3d If not improved in 48 hours then NTBS if worse be seen sooner or ER

## 2013-11-01 NOTE — Telephone Encounter (Signed)
Notified patient medication was sent to pharmacy. If not improved in 48 hours then NTBS if worse be seen sooner or ER. Patient verbalized understanding.

## 2013-11-12 ENCOUNTER — Other Ambulatory Visit: Payer: Self-pay | Admitting: Family Medicine

## 2013-11-14 NOTE — Telephone Encounter (Signed)
May refill this +4 additional refills 

## 2013-12-29 ENCOUNTER — Encounter: Payer: Self-pay | Admitting: Family Medicine

## 2013-12-29 ENCOUNTER — Ambulatory Visit (INDEPENDENT_AMBULATORY_CARE_PROVIDER_SITE_OTHER): Payer: BC Managed Care – PPO | Admitting: Family Medicine

## 2013-12-29 VITALS — BP 134/82 | Ht 72.0 in | Wt 286.0 lb

## 2013-12-29 DIAGNOSIS — G43101 Migraine with aura, not intractable, with status migrainosus: Secondary | ICD-10-CM

## 2013-12-29 DIAGNOSIS — G43111 Migraine with aura, intractable, with status migrainosus: Secondary | ICD-10-CM

## 2013-12-29 MED ORDER — TOPIRAMATE ER 200 MG PO CAP24: ORAL_CAPSULE | ORAL | Status: DC

## 2013-12-29 MED ORDER — OXYCODONE-ACETAMINOPHEN 5-325 MG PO TABS: 1.0000 | ORAL_TABLET | ORAL | Status: DC | PRN

## 2013-12-29 MED ORDER — VERAPAMIL HCL ER 180 MG PO TBCR: 180.0000 mg | EXTENDED_RELEASE_TABLET | Freq: Every day | ORAL | Status: DC

## 2013-12-29 NOTE — Progress Notes (Signed)
   Subjective:    Patient ID: Andrea [name], female    DOB: 30-May-1981, 33 y.o.   MRN: [id number]  HPI  Patient arrives with complaint of migraine headaches for 2 days. Patient is on Migraine preventative meds but not helping. Under some stress relates migraines approximately 2-3 times per week. This is more frequent than it used to be. Review of Systems No fever no double vision denies waking up an unknown I with a headache.    Objective:   Physical Exam Neurologic grossly normal no unilateral numbness or weakness neck is supple lungs clear heart regular pulse normal       Assessment & Plan:  Frequent migraines by history. Moderate migraine currently. Increased extended release Topamax. Go to maximum dose 400 mg daily. Followup patient in 4 weeks. If not significantly improved will need referral to neurology. To do a headache calendar. Percocet only when necessary for severe pain not for frequent use cautioned drowsiness warning signs were discussed. Go to ER or followup immediately if worse

## 2013-12-29 NOTE — Patient Instructions (Signed)
DASH Eating Plan  DASH stands for "Dietary Approaches to Stop Hypertension." The DASH eating plan is a healthy eating plan that has been shown to reduce high blood pressure (hypertension). Additional health benefits may include reducing the risk of type 2 diabetes mellitus, heart disease, and stroke. The DASH eating plan may also help with weight loss.  WHAT DO I NEED TO KNOW ABOUT THE DASH EATING PLAN?  For the DASH eating plan, you will follow these general guidelines:  · Choose foods with a percent daily value for sodium of less than 5% (as listed on the food label).  · Use salt-free seasonings or herbs instead of table salt or sea salt.  · Check with your health care provider or pharmacist before using salt substitutes.  · Eat lower-sodium products, often labeled as "lower sodium" or "no salt added."  · Eat fresh foods.  · Eat more vegetables, fruits, and low-fat dairy products.  · Choose whole grains. Look for the word "whole" as the first word in the ingredient list.  · Choose fish and skinless chicken or turkey more often than red meat. Limit fish, poultry, and meat to 6 oz (170 g) each day.  · Limit sweets, desserts, sugars, and sugary drinks.  · Choose heart-healthy fats.  · Limit cheese to 1 oz (28 g) per day.  · Eat more home-cooked food and less restaurant, buffet, and fast food.  · Limit fried foods.  · Cook foods using methods other than frying.  · Limit canned vegetables. If you do use them, rinse them well to decrease the sodium.  · When eating at a restaurant, ask that your food be prepared with less salt, or no salt if possible.  WHAT FOODS CAN I EAT?  Seek help from a dietitian for individual calorie needs.  Grains  Whole grain or whole wheat bread. Brown rice. Whole grain or whole wheat pasta. Quinoa, bulgur, and whole grain cereals. Low-sodium cereals. Corn or whole wheat flour tortillas. Whole grain cornbread. Whole grain crackers. Low-sodium crackers.  Vegetables  Fresh or frozen vegetables  (raw, steamed, roasted, or grilled). Low-sodium or reduced-sodium tomato and vegetable juices. Low-sodium or reduced-sodium tomato sauce and paste. Low-sodium or reduced-sodium canned vegetables.   Fruits  All fresh, canned (in natural juice), or frozen fruits.  Meat and Other Protein Products  Ground beef (85% or leaner), grass-fed beef, or beef trimmed of fat. Skinless chicken or turkey. Ground chicken or turkey. Pork trimmed of fat. All fish and seafood. Eggs. Dried beans, peas, or lentils. Unsalted nuts and seeds. Unsalted canned beans.  Dairy  Low-fat dairy products, such as skim or 1% milk, 2% or reduced-fat cheeses, low-fat ricotta or cottage cheese, or plain low-fat yogurt. Low-sodium or reduced-sodium cheeses.  Fats and Oils  Tub margarines without trans fats. Light or reduced-fat mayonnaise and salad dressings (reduced sodium). Avocado. Safflower, olive, or canola oils. Natural peanut or almond butter.  Other  Unsalted popcorn and pretzels.  The items listed above may not be a complete list of recommended foods or beverages. Contact your dietitian for more options.  WHAT FOODS ARE NOT RECOMMENDED?  Grains  White bread. White pasta. White rice. Refined cornbread. Bagels and croissants. Crackers that contain trans fat.  Vegetables  Creamed or fried vegetables. Vegetables in a cheese sauce. Regular canned vegetables. Regular canned tomato sauce and paste. Regular tomato and vegetable juices.  Fruits  Dried fruits. Canned fruit in light or heavy syrup. Fruit juice.  Meat and Other Protein   Products  Fatty cuts of meat. Ribs, chicken wings, bacon, sausage, bologna, salami, chitterlings, fatback, hot dogs, bratwurst, and packaged luncheon meats. Salted nuts and seeds. Canned beans with salt.  Dairy  Whole or 2% milk, cream, half-and-half, and cream cheese. Whole-fat or sweetened yogurt. Full-fat cheeses or blue cheese. Nondairy creamers and whipped toppings. Processed cheese, cheese spreads, or cheese  curds.  Condiments  Onion and garlic salt, seasoned salt, table salt, and sea salt. Canned and packaged gravies. Worcestershire sauce. Tartar sauce. Barbecue sauce. Teriyaki sauce. Soy sauce, including reduced sodium. Steak sauce. Fish sauce. Oyster sauce. Cocktail sauce. Horseradish. Ketchup and mustard. Meat flavorings and tenderizers. Bouillon cubes. Hot sauce. Tabasco sauce. Marinades. Taco seasonings. Relishes.  Fats and Oils  Butter, stick margarine, lard, shortening, ghee, and bacon fat. Coconut, palm kernel, or palm oils. Regular salad dressings.  Other  Pickles and olives. Salted popcorn and pretzels.  The items listed above may not be a complete list of foods and beverages to avoid. Contact your dietitian for more information.  WHERE CAN I FIND MORE INFORMATION?  National Heart, Lung, and Blood Institute: www.nhlbi.nih.gov/health/health-topics/topics/dash/  Document Released: 05/23/2011 Document Revised: 06/08/2013 Document Reviewed: 04/07/2013  ExitCare® Patient Information ©2015 [employer]. This information is not intended to replace advice given to you by your health care provider. Make sure you discuss any questions you have with your health care provider.

## 2013-12-31 ENCOUNTER — Ambulatory Visit: Payer: BC Managed Care – PPO | Admitting: Nurse Practitioner

## 2014-01-19 ENCOUNTER — Other Ambulatory Visit: Payer: Self-pay | Admitting: Family Medicine

## 2014-01-25 ENCOUNTER — Ambulatory Visit (INDEPENDENT_AMBULATORY_CARE_PROVIDER_SITE_OTHER): Payer: BC Managed Care – PPO | Admitting: Family Medicine

## 2014-01-25 ENCOUNTER — Encounter: Payer: Self-pay | Admitting: Family Medicine

## 2014-01-25 VITALS — BP 130/82 | Temp 98.1°F | Ht 72.0 in | Wt 288.0 lb

## 2014-01-25 DIAGNOSIS — J0110 Acute frontal sinusitis, unspecified: Secondary | ICD-10-CM

## 2014-01-25 DIAGNOSIS — R300 Dysuria: Secondary | ICD-10-CM

## 2014-01-25 LAB — POCT URINALYSIS DIPSTICK
PH UA: 8
SPEC GRAV UA: 1.015

## 2014-01-25 MED ORDER — LEVOFLOXACIN 500 MG PO TABS: 500.0000 mg | ORAL_TABLET | Freq: Every day | ORAL | Status: DC

## 2014-01-25 MED ORDER — FLUCONAZOLE 150 MG PO TABS: ORAL_TABLET | ORAL | Status: DC

## 2014-01-25 NOTE — Progress Notes (Signed)
   Subjective:    Patient ID: Andrea [name], female    DOB: [birth year], 33 y.o.   MRN: [id number]  Sinus Problem This is a new problem. The current episode started in the past 7 days. Associated symptoms include congestion, coughing and ear pain. Pertinent negatives include no shortness of breath. (Dysuria for 3 days) Treatments tried: advil sinus congestion.    Patient also complains of some lower bowel discomfort dysuria been going on for a few days. No other particular problems no high fevers  Review of Systems  Constitutional: Negative for fever and activity change.  HENT: Positive for congestion, ear pain and rhinorrhea.   Eyes: Negative for discharge.  Respiratory: Positive for cough. Negative for shortness of breath and wheezing.   Cardiovascular: Negative for chest pain.       Objective:   Physical Exam  Nursing note and vitals reviewed. Constitutional: She appears well-developed.  HENT:  Head: Normocephalic.  Nose: Nose normal.  Mouth/Throat: Oropharynx is clear and moist. No oropharyngeal exudate.  Neck: Neck supple.  Cardiovascular: Normal rate and normal heart sounds.   No murmur heard. Pulmonary/Chest: Effort normal and breath sounds normal. She has no wheezes.  Lymphadenopathy:    She has no cervical adenopathy.  Skin: Skin is warm and dry.          Assessment & Plan:  Urinary tract infection antibiotics prescribed Levaquin 10 days culture requested  Sinusitis antibiotics prescribed Levaquin 10 days out of the medicines as well warning signs discussed followup if

## 2014-01-27 LAB — URINE CULTURE: Colony Count: 70000

## 2014-02-14 ENCOUNTER — Ambulatory Visit (INDEPENDENT_AMBULATORY_CARE_PROVIDER_SITE_OTHER): Payer: BC Managed Care – PPO | Admitting: Family Medicine

## 2014-02-14 ENCOUNTER — Encounter: Payer: Self-pay | Admitting: Family Medicine

## 2014-02-14 VITALS — BP 126/84 | Temp 98.1°F | Ht 72.0 in | Wt 294.0 lb

## 2014-02-14 DIAGNOSIS — I10 Essential (primary) hypertension: Secondary | ICD-10-CM

## 2014-02-14 DIAGNOSIS — R7301 Impaired fasting glucose: Secondary | ICD-10-CM

## 2014-02-14 DIAGNOSIS — Z79899 Other long term (current) drug therapy: Secondary | ICD-10-CM

## 2014-02-14 DIAGNOSIS — R5381 Other malaise: Secondary | ICD-10-CM

## 2014-02-14 DIAGNOSIS — R5383 Other fatigue: Secondary | ICD-10-CM

## 2014-02-14 MED ORDER — ZOLPIDEM TARTRATE 10 MG PO TABS: 10.0000 mg | ORAL_TABLET | Freq: Every evening | ORAL | Status: DC | PRN

## 2014-02-14 MED ORDER — VENLAFAXINE HCL ER 75 MG PO CP24: 75.0000 mg | ORAL_CAPSULE | Freq: Every day | ORAL | Status: DC

## 2014-02-14 MED ORDER — CLORAZEPATE DIPOTASSIUM 3.75 MG PO TABS: ORAL_TABLET | ORAL | Status: DC

## 2014-02-14 MED ORDER — AMLODIPINE BESYLATE 2.5 MG PO TABS: 2.5000 mg | ORAL_TABLET | Freq: Every day | ORAL | Status: DC

## 2014-02-14 NOTE — Progress Notes (Signed)
   Subjective:    Patient ID: Andrea [name], female    DOB: [birth year], 33 y.o.   MRN: [id number]  Hypertension This is a chronic problem. The current episode started more than 1 month ago. The problem has been gradually improving since onset. The problem is controlled. Pertinent negatives include no chest pain. There are no associated agents to hypertension. There are no known risk factors for coronary artery disease. Treatments tried: verapamil. The current treatment provides significant improvement. There are no compliance problems.    Patient states she wants to talk about her depression and anxiety medications.long discussion held regarding depression she is stressed out. She is anxious. Her work is not satisfying. She is hoping to shift to a different location. In addition to this she denies being suicidal. Patient states she has severe diarrhea and constipation. She has intermittent constipation diarrhea headaches fatigue tiredness not feeling good denies mucus in the stool denies blood in his. Patient wants to talk about her recent weight gain issues. She relates gaining significant amount of weight she is trying to lose weight but is having   Review of Systems  Constitutional: Negative for activity change, appetite change and fatigue.  Respiratory: Negative for cough and stridor.   Cardiovascular: Negative for chest pain.  Gastrointestinal: Positive for nausea and diarrhea. Negative for abdominal pain.  Endocrine: Negative for polydipsia and polyphagia.  Genitourinary: Negative for frequency.  Neurological: Negative for weakness.  Psychiatric/Behavioral: Negative for confusion.       Objective:   Physical Exam  Vitals reviewed. Constitutional: She appears well-nourished. No distress.  Cardiovascular: Normal rate, regular rhythm and normal heart sounds.   No murmur heard. Pulmonary/Chest: Effort normal and breath sounds normal. No respiratory distress.  Abdominal: Soft.    Musculoskeletal: She exhibits no edema.  Lymphadenopathy:    She has no cervical adenopathy.  Neurological: She is alert. She exhibits normal muscle tone.  Psychiatric: Her behavior is normal.          Assessment & Plan:  #1 depression-stop Zoloft. Reduce it to one tablet a day for 7 days then use Effexor 75 mg XR. I do recommend this patient followup again in several weeks. She is not suicidal. She will followup sooner if any problems  #2 blood pressure-breast milk causing constipation change to a different blood pressure medication. If side effects notify us. Followups weeks to recheck blood pressure.  #3 weight gain-unfortunately patient is not a candidate for additional medications given that she is on currently not a candidate for the knee were weight reduction medications. Possibly can use phentermine if lab work comes back looking good  Significant fatigue tiredness, diarrhea, blood pressure issues, weight gain lab work ordered.

## 2014-02-22 LAB — LIPID PANEL
CHOL/HDL RATIO: 5 ratio
CHOLESTEROL: 144 mg/dL (ref 0–200)
HDL: 29 mg/dL — ABNORMAL LOW (ref >39)
LDL Cholesterol: 93 mg/dL (ref 0–99)
Triglycerides: 108 mg/dL (ref <150)
VLDL: 22 mg/dL (ref 0–40)

## 2014-02-22 LAB — CBC WITH DIFFERENTIAL/PLATELET
Basophils Absolute: 0.1 10*3/uL (ref 0.0–0.1)
Basophils Relative: 1 % (ref 0–1)
EOS ABS: 0.2 10*3/uL (ref 0.0–0.7)
Eosinophils Relative: 3 % (ref 0–5)
HCT: 35 % — ABNORMAL LOW (ref 36.0–46.0)
HEMOGLOBIN: 11.5 g/dL — AB (ref 12.0–15.0)
LYMPHS ABS: 1.9 10*3/uL (ref 0.7–4.0)
Lymphocytes Relative: 29 % (ref 12–46)
MCH: 26.7 pg (ref 26.0–34.0)
MCHC: 32.9 g/dL (ref 30.0–36.0)
MCV: 81.2 fL (ref 78.0–100.0)
MONOS PCT: 6 % (ref 3–12)
Monocytes Absolute: 0.4 10*3/uL (ref 0.1–1.0)
Neutro Abs: 4 10*3/uL (ref 1.7–7.7)
Neutrophils Relative %: 61 % (ref 43–77)
Platelets: 240 10*3/uL (ref 150–400)
RBC: 4.31 MIL/uL (ref 3.87–5.11)
RDW: 15 % (ref 11.5–15.5)
WBC: 6.5 10*3/uL (ref 4.0–10.5)

## 2014-02-22 LAB — TSH: TSH: 2.834 u[IU]/mL (ref 0.350–4.500)

## 2014-02-22 LAB — BASIC METABOLIC PANEL
BUN: 11 mg/dL (ref 6–23)
CO2: 22 meq/L (ref 19–32)
Calcium: 8.9 mg/dL (ref 8.4–10.5)
Chloride: 111 mEq/L (ref 96–112)
Creat: 0.88 mg/dL (ref 0.50–1.10)
GLUCOSE: 114 mg/dL — AB (ref 70–99)
Potassium: 3.8 mEq/L (ref 3.5–5.3)
SODIUM: 141 meq/L (ref 135–145)

## 2014-02-23 LAB — HEMOGLOBIN A1C
Hgb A1c MFr Bld: 5.8 % — ABNORMAL HIGH (ref <5.7)
Mean Plasma Glucose: 120 mg/dL — ABNORMAL HIGH (ref <117)

## 2014-02-25 ENCOUNTER — Telehealth: Payer: Self-pay | Admitting: Family Medicine

## 2014-02-25 NOTE — Telephone Encounter (Signed)
Rx prior auth APPROVED for pt's zolpidem (AMBIEN) 10 MG tablet, expires 02/25/15  Case# [id number] through [employer], faxed approval to [facility]

## 2014-03-02 ENCOUNTER — Encounter: Payer: Self-pay | Admitting: Family Medicine

## 2014-03-02 ENCOUNTER — Ambulatory Visit (INDEPENDENT_AMBULATORY_CARE_PROVIDER_SITE_OTHER): Payer: BC Managed Care – PPO | Admitting: Family Medicine

## 2014-03-02 VITALS — BP 124/82 | Temp 98.4°F | Ht 72.0 in | Wt 290.0 lb

## 2014-03-02 DIAGNOSIS — J4521 Mild intermittent asthma with (acute) exacerbation: Secondary | ICD-10-CM

## 2014-03-02 DIAGNOSIS — J019 Acute sinusitis, unspecified: Secondary | ICD-10-CM

## 2014-03-02 DIAGNOSIS — J45901 Unspecified asthma with (acute) exacerbation: Secondary | ICD-10-CM

## 2014-03-02 MED ORDER — FLUTICASONE PROPIONATE HFA 220 MCG/ACT IN AERO: 2.0000 | INHALATION_SPRAY | Freq: Two times a day (BID) | RESPIRATORY_TRACT | Status: DC

## 2014-03-02 MED ORDER — LEVOFLOXACIN 500 MG PO TABS: 500.0000 mg | ORAL_TABLET | Freq: Every day | ORAL | Status: DC

## 2014-03-02 NOTE — Progress Notes (Signed)
   Subjective:    Patient ID: Andrea [name], female    DOB: 17-Mar-1981, 33 y.o.   MRN: [id number]  Cough This is a new problem. The current episode started in the past 7 days. Associated symptoms include ear pain, a fever, myalgias, nasal congestion and wheezing. Treatments tried: motrin.   Patient gets more trouble with coughing wheezing during the spring and fall months causes frequent rest real nurse's  Review of Systems  Constitutional: Positive for fever.  HENT: Positive for ear pain.   Respiratory: Positive for cough and wheezing.   Musculoskeletal: Positive for myalgias.       Objective:   Physical Exam Lungs clear occasional wheeze no crackles no respiratory distress heart regular mild sinus tenderness       Assessment & Plan:  Asthma mild intermittent use steroid inhaler this should help albuterol when necessary use a steroid inhaler during the spring months and fall months  Acute sinusitis with bronchitis antibiotics prescribed call back if problems  Her lab work was reviewed overall she is doing well in that regards does have prediabetes we will repeat lab work in 6 months

## 2014-03-14 ENCOUNTER — Ambulatory Visit: Payer: BC Managed Care – PPO | Admitting: Family Medicine

## 2014-03-25 ENCOUNTER — Other Ambulatory Visit: Payer: Self-pay | Admitting: Family Medicine

## 2014-03-25 MED ORDER — LEVOFLOXACIN 500 MG PO TABS: 500.0000 mg | ORAL_TABLET | Freq: Every day | ORAL | Status: DC

## 2014-03-25 MED ORDER — NAPROXEN 500 MG PO TABS: ORAL_TABLET | ORAL | Status: DC

## 2014-03-25 NOTE — Telephone Encounter (Signed)
She may have a refill of her Naprosyn, 6 refills. One refill of Levaquin. As for the tramadol I need to know how many times she is taking this per week. The patient needs to know that tramadol is not meant to be a frequently used medicine. As for muscle relaxers there are no muscle relaxers that will last all day and stronger muscle relaxers can cause significant drowsiness which can affect cognition with work. Please discuss with the patient does aspects. Then we can act accordingly on the tramadol as well as a muscle relaxer. It is okay to go ahead with the other refills.

## 2014-03-25 NOTE — Telephone Encounter (Signed)
Refill on Tramadol--could this be a month supply instead of only 24. (last refill 2/20) I only use as needed for pain in lower back and migraine refill. Same copay.    Refill needed on Chlorzoxazone--could this be changed to a different muscle relaxant this one seems not to be working anymore after working 12 hour days. Please fill a month supply again---same copay

## 2014-03-28 ENCOUNTER — Telehealth: Payer: Self-pay | Admitting: Family Medicine

## 2014-03-28 NOTE — Telephone Encounter (Signed)
Pt states she is taking tramadol 1 -2 times per week only for migraines. And takes chlorzoxazone one every night for her back.

## 2014-03-28 NOTE — Telephone Encounter (Signed)
error 

## 2014-03-29 MED ORDER — TRAMADOL HCL 50 MG PO TABS: ORAL_TABLET | ORAL | Status: DC

## 2014-03-29 MED ORDER — TIZANIDINE HCL 4 MG PO TABS: 4.0000 mg | ORAL_TABLET | Freq: Every evening | ORAL | Status: DC | PRN

## 2014-03-30 ENCOUNTER — Other Ambulatory Visit: Payer: Self-pay | Admitting: *Deleted

## 2014-03-30 MED ORDER — TRAMADOL HCL 50 MG PO TABS: ORAL_TABLET | ORAL | Status: DC

## 2014-04-25 ENCOUNTER — Encounter (HOSPITAL_COMMUNITY): Payer: Self-pay | Admitting: *Deleted

## 2014-04-25 ENCOUNTER — Emergency Department (HOSPITAL_COMMUNITY)
Admission: EM | Admit: 2014-04-25 | Discharge: 2014-04-25 | Disposition: A | Discharge status: Home / Self Care | Payer: BC Managed Care – PPO | Attending: Emergency Medicine | Admitting: Emergency Medicine

## 2014-04-25 ENCOUNTER — Ambulatory Visit: Payer: BC Managed Care – PPO | Admitting: Family Medicine

## 2014-04-25 ENCOUNTER — Emergency Department (HOSPITAL_COMMUNITY): Payer: BC Managed Care – PPO

## 2014-04-25 DIAGNOSIS — R1011 Right upper quadrant pain: Secondary | ICD-10-CM | POA: Insufficient documentation

## 2014-04-25 DIAGNOSIS — G43909 Migraine, unspecified, not intractable, without status migrainosus: Secondary | ICD-10-CM | POA: Insufficient documentation

## 2014-04-25 DIAGNOSIS — Z9889 Other specified postprocedural states: Secondary | ICD-10-CM | POA: Diagnosis not present

## 2014-04-25 DIAGNOSIS — R51 Headache: Secondary | ICD-10-CM

## 2014-04-25 DIAGNOSIS — R1012 Left upper quadrant pain: Secondary | ICD-10-CM | POA: Diagnosis not present

## 2014-04-25 DIAGNOSIS — Z3202 Encounter for pregnancy test, result negative: Secondary | ICD-10-CM | POA: Insufficient documentation

## 2014-04-25 DIAGNOSIS — Z8639 Personal history of other endocrine, nutritional and metabolic disease: Secondary | ICD-10-CM | POA: Insufficient documentation

## 2014-04-25 DIAGNOSIS — R55 Syncope and collapse: Secondary | ICD-10-CM | POA: Insufficient documentation

## 2014-04-25 DIAGNOSIS — R52 Pain, unspecified: Secondary | ICD-10-CM

## 2014-04-25 DIAGNOSIS — R519 Headache, unspecified: Secondary | ICD-10-CM

## 2014-04-25 DIAGNOSIS — Z9049 Acquired absence of other specified parts of digestive tract: Secondary | ICD-10-CM | POA: Insufficient documentation

## 2014-04-25 DIAGNOSIS — Z7952 Long term (current) use of systemic steroids: Secondary | ICD-10-CM | POA: Insufficient documentation

## 2014-04-25 DIAGNOSIS — Z8719 Personal history of other diseases of the digestive system: Secondary | ICD-10-CM | POA: Insufficient documentation

## 2014-04-25 DIAGNOSIS — Z87442 Personal history of urinary calculi: Secondary | ICD-10-CM | POA: Diagnosis not present

## 2014-04-25 DIAGNOSIS — Z791 Long term (current) use of non-steroidal anti-inflammatories (NSAID): Secondary | ICD-10-CM | POA: Diagnosis not present

## 2014-04-25 DIAGNOSIS — Z79899 Other long term (current) drug therapy: Secondary | ICD-10-CM | POA: Diagnosis not present

## 2014-04-25 LAB — URINE MICROSCOPIC-ADD ON

## 2014-04-25 LAB — COMPREHENSIVE METABOLIC PANEL
ALT: 23 U/L (ref 0–35)
AST: 22 U/L (ref 0–37)
Albumin: 3.9 g/dL (ref 3.5–5.2)
Alkaline Phosphatase: 66 U/L (ref 39–117)
Anion gap: 11 (ref 5–15)
BUN: 12 mg/dL (ref 6–23)
CO2: 22 meq/L (ref 19–32)
CREATININE: 0.69 mg/dL (ref 0.50–1.10)
Calcium: 8.9 mg/dL (ref 8.4–10.5)
Chloride: 109 mEq/L (ref 96–112)
Glucose, Bld: 141 mg/dL — ABNORMAL HIGH (ref 70–99)
Potassium: 3.4 mEq/L — ABNORMAL LOW (ref 3.7–5.3)
SODIUM: 142 meq/L (ref 137–147)
Total Bilirubin: 0.2 mg/dL — ABNORMAL LOW (ref 0.3–1.2)
Total Protein: 7.1 g/dL (ref 6.0–8.3)

## 2014-04-25 LAB — CBC WITH DIFFERENTIAL/PLATELET
Basophils Absolute: 0 10*3/uL (ref 0.0–0.1)
Basophils Relative: 0 % (ref 0–1)
Eosinophils Absolute: 0.4 10*3/uL (ref 0.0–0.7)
Eosinophils Relative: 5 % (ref 0–5)
HCT: 38.3 % (ref 36.0–46.0)
Hemoglobin: 12.6 g/dL (ref 12.0–15.0)
Lymphocytes Relative: 34 % (ref 12–46)
Lymphs Abs: 2.6 10*3/uL (ref 0.7–4.0)
MCH: 28.4 pg (ref 26.0–34.0)
MCHC: 32.9 g/dL (ref 30.0–36.0)
MCV: 86.5 fL (ref 78.0–100.0)
Monocytes Absolute: 0.4 10*3/uL (ref 0.1–1.0)
Monocytes Relative: 5 % (ref 3–12)
NEUTROS ABS: 4.2 10*3/uL (ref 1.7–7.7)
NEUTROS PCT: 56 % (ref 43–77)
PLATELETS: 212 10*3/uL (ref 150–400)
RBC: 4.43 MIL/uL (ref 3.87–5.11)
RDW: 14 % (ref 11.5–15.5)
WBC: 7.6 10*3/uL (ref 4.0–10.5)

## 2014-04-25 LAB — URINALYSIS, ROUTINE W REFLEX MICROSCOPIC
Bilirubin Urine: NEGATIVE
GLUCOSE, UA: NEGATIVE mg/dL
KETONES UR: NEGATIVE mg/dL
Leukocytes, UA: NEGATIVE
Nitrite: NEGATIVE
PROTEIN: NEGATIVE mg/dL
Specific Gravity, Urine: 1.025 (ref 1.005–1.030)
Urobilinogen, UA: 0.2 mg/dL (ref 0.0–1.0)
pH: 6 (ref 5.0–8.0)

## 2014-04-25 LAB — PREGNANCY, URINE: Preg Test, Ur: NEGATIVE

## 2014-04-25 MED ORDER — SODIUM CHLORIDE 0.9 % IV BOLUS (SEPSIS)
1000.0000 mL | Freq: Once | INTRAVENOUS | Status: AC
  Administered 2014-04-25: 1000 mL via INTRAVENOUS

## 2014-04-25 MED ORDER — ONDANSETRON HCL 4 MG/2ML IJ SOLN
4.0000 mg | Freq: Once | INTRAMUSCULAR | Status: AC
  Administered 2014-04-25: 4 mg via INTRAVENOUS
  Filled 2014-04-25: qty 2

## 2014-04-25 MED ORDER — KETOROLAC TROMETHAMINE 30 MG/ML IJ SOLN
30.0000 mg | Freq: Once | INTRAMUSCULAR | Status: AC
  Administered 2014-04-25: 30 mg via INTRAVENOUS
  Filled 2014-04-25: qty 1

## 2014-04-25 MED ORDER — ONDANSETRON 4 MG PO TBDP: ORAL_TABLET | ORAL | Status: DC

## 2014-04-25 MED ORDER — HYDROMORPHONE HCL 1 MG/ML IJ SOLN
1.0000 mg | Freq: Once | INTRAMUSCULAR | Status: AC
  Administered 2014-04-25: 1 mg via INTRAVENOUS
  Filled 2014-04-25: qty 1

## 2014-04-25 MED ORDER — OXYCODONE-ACETAMINOPHEN 5-325 MG PO TABS: 1.0000 | ORAL_TABLET | Freq: Four times a day (QID) | ORAL | Status: DC | PRN

## 2014-04-25 NOTE — ED Notes (Signed)
Pt left ed with partner. Verbalized understanding re discharge instructions, no questions.

## 2014-04-25 NOTE — ED Provider Notes (Signed)
CSN: [id number]     Arrival date & time 04/25/14  1118 History  This chart was scribed for Maudry Diego, MD by Edison Simon, ED Scribe. This patient was seen in room APA19/APA19 and the patient's care was started at 1:19 PM.    Chief Complaint  Patient presents with  . Abdominal Pain     Patient is a 33 y.o. female presenting with abdominal pain. The history is provided by the patient (the pt complains of abd pain and headache). No language interpreter was used.  Abdominal Pain Pain location:  RUQ, epigastric and LUQ Pain quality: not aching   Pain radiates to:  Does not radiate Pain severity:  Moderate Onset quality:  Sudden Timing:  Intermittent Progression:  Unchanged Chronicity:  New Context: not alcohol use   Relieved by:  None tried Worsened by:  Nothing tried Ineffective treatments:  None tried Associated symptoms: no chest pain, no cough, no diarrhea, no fatigue and no hematuria     HPI Comments: Andrea [name] is a 33 y.o. female with a history of migraine who presents to the Emergency Department complaining of headache with onset 3 days ago, worsening yesterday, and intermittent abdominal pain and tenderness. Pt states she has had associated constant watery diarrhea for a week. Pt notes diarrhea worsens after eating.  Associated with headache pain she had generalized numbness, disturbance of vision, and possible LOC.  Pt notes she takes bcp, Effexor, migraine medicine, BP medicine, and Norvasc.  Pt has PSHX of cholecystectomy. Pt notes nausea but denies any vomiting.   Past Medical History  Diagnosis Date  . GERD (gastroesophageal reflux disease)   . Migraines   . PCOS (polycystic ovarian syndrome)   . Kidney stones    Past Surgical History  Procedure Laterality Date  . Cholecystectomy    . Lymph nodes removed    . Ovarian cyst removed     History reviewed. No pertinent family history. History  Substance Use Topics  . Smoking status: Never Smoker   .  Smokeless tobacco: Not on file  . Alcohol Use: No   OB History    No data available     Review of Systems  Constitutional: Negative for appetite change and fatigue.  HENT: Negative for congestion, ear discharge and sinus pressure.   Eyes: Negative for discharge.  Respiratory: Negative for cough.   Cardiovascular: Negative for chest pain.  Gastrointestinal: Positive for abdominal pain. Negative for diarrhea.  Genitourinary: Negative for frequency and hematuria.  Musculoskeletal: Negative for back pain.  Skin: Negative for rash.  Neurological: Positive for syncope. Negative for seizures and headaches.  Psychiatric/Behavioral: Negative for hallucinations.      Allergies  Bactrim and Vicodin  Home Medications   Prior to Admission medications   Medication Sig Start Date End Date Taking? Authorizing Provider  albuterol (PROVENTIL HFA;VENTOLIN HFA) 108 (90 BASE) MCG/ACT inhaler Inhale 2 puffs into the lungs every 6 (six) hours as needed for wheezing. 10/25/13   Kathyrn Drown, MD  amLODipine (NORVASC) 2.5 MG tablet Take 1 tablet (2.5 mg total) by mouth daily. 02/14/14   Kathyrn Drown, MD  clorazepate (TRANXENE-T) 3.75 MG tablet Take 1/2 tablet BID prn 02/14/14   Kathyrn Drown, MD  CRANBERRY PO Take 2 capsules by mouth 3 (three) times daily.    Historical Provider, MD  fluticasone (FLOVENT HFA) 220 MCG/ACT inhaler Inhale 2 puffs into the lungs 2 (two) times daily. 03/02/14   Kathyrn Drown, MD  levofloxacin [facility])  500 MG tablet Take 1 tablet (500 mg total) by mouth daily. 03/25/14   Kathyrn Drown, MD  naproxen (NAPROSYN) 500 MG tablet TAKE ONE TABLET BY MOUTH TWICE DAILY AS NEEDED FOR PAIN 03/25/14   Kathyrn Drown, MD  ondansetron (ZOFRAN) 8 MG tablet Take 1 tablet (8 mg total) by mouth every 8 (eight) hours as needed for nausea. 12/22/12   Kathyrn Drown, MD  oxyCODONE-acetaminophen (PERCOCET/ROXICET) 5-325 MG per tablet Take 1 tablet by mouth every 4 (four) hours as needed for severe  pain. 12/29/13   Kathyrn Drown, MD  tiZANidine (ZANAFLEX) 4 MG tablet Take 1 tablet (4 mg total) by mouth at bedtime as needed for muscle spasms. 03/29/14   Kathyrn Drown, MD  Topiramate ER (TROKENDI XR) 200 MG CP24 TAKE 2 TABLET BY MOUTH DAILY. 12/29/13   Kathyrn Drown, MD  traMADol (ULTRAM) 50 MG tablet TAKE ONE TO TWO AS NEEDED FOR MIGRAINE. CAUTION DROWSINESS. NOT FOR FREQUENT USE. 03/30/14   Kathyrn Drown, MD  UNABLE TO FIND Birth control pill    Historical Provider, MD  venlafaxine XR (EFFEXOR XR) 75 MG 24 hr capsule Take 1 capsule (75 mg total) by mouth daily with breakfast. 02/14/14   Kathyrn Drown, MD  zolpidem (AMBIEN) 10 MG tablet Take 1 tablet (10 mg total) by mouth at bedtime as needed for sleep. 02/14/14 03/16/14  Kathyrn Drown, MD   BP 138/83 mmHg  Pulse 68  Temp(Src) 98.6 F (37 C) (Oral)  Resp 18  Ht 6' (1.829 m)  Wt 285 lb (129.275 kg)  BMI 38.64 kg/m2  SpO2 100%  LMP 04/17/2014 Physical Exam  Constitutional: She is oriented to person, place, and time. She appears well-developed.  HENT:  Head: Normocephalic.  Eyes: Conjunctivae and EOM are normal. No scleral icterus.  Neck: Neck supple. No thyromegaly present.  Cardiovascular: Normal rate and regular rhythm.  Exam reveals no gallop and no friction rub.   No murmur heard. Pulmonary/Chest: No stridor. She has no wheezes. She has no rales. She exhibits no tenderness.  Abdominal: She exhibits no distension. There is tenderness (Mild tenderness epigastric and LUQ). There is no rebound.  Musculoskeletal: Normal range of motion. She exhibits no edema.  Lymphadenopathy:    She has no cervical adenopathy.  Neurological: She is oriented to person, place, and time. She exhibits normal muscle tone. Coordination normal.  Skin: No rash noted. No erythema.  Psychiatric: She has a normal mood and affect. Her behavior is normal.  Nursing note and vitals reviewed.   ED Course  Procedures (including critical care  time)  DIAGNOSTIC STUDIES: Oxygen Saturation is 100% on room air, normal by my interpretation.    COORDINATION OF CARE: 1:22 PM Discussed treatment plan with patient at beside, the patient agrees with the plan and has no further questions at this time.   Labs Review Labs Reviewed  COMPREHENSIVE METABOLIC PANEL - Abnormal; Notable for the following:    Potassium 3.4 (*)    Glucose, Bld 141 (*)    Total Bilirubin 0.2 (*)    All other components within normal limits  CBC WITH DIFFERENTIAL  URINALYSIS, ROUTINE W REFLEX MICROSCOPIC  PREGNANCY, URINE    Imaging Review No results found.   EKG Interpretation None      MDM   Final diagnoses:  None   Viral syndrome,  Will tx symptoms and have pt follow up   The chart was scribed for me under my direct supervision.  I personally performed the history, physical, and medical decision making and all procedures in the evaluation of this patient.Maudry Diego, MD 04/25/14 [phone]

## 2014-04-25 NOTE — ED Notes (Signed)
abd pain withdiarrhea for 1 week, headache for 3 days.  Had episode of  Worsening headache last night and "I couldn't see " for 30  -40 min.  Normal vision since then.

## 2014-04-25 NOTE — Discharge Instructions (Signed)
Follow up as needed.  Drink plenty of fluids °

## 2014-04-27 ENCOUNTER — Telehealth: Payer: Self-pay | Admitting: *Deleted

## 2014-04-27 NOTE — Telephone Encounter (Signed)
Spoke with patient. She is still having s/s of abdominal pain, diarrhea, HA, fever, and vomiting. Dr. Richardson Landry said with those s/s, she needs to go to ER. Pt verbalized understanding.

## 2014-05-16 ENCOUNTER — Ambulatory Visit (INDEPENDENT_AMBULATORY_CARE_PROVIDER_SITE_OTHER): Payer: BC Managed Care – PPO | Admitting: Family Medicine

## 2014-05-16 ENCOUNTER — Encounter: Payer: Self-pay | Admitting: Family Medicine

## 2014-05-16 VITALS — BP 128/76 | Ht 72.0 in | Wt 286.0 lb

## 2014-05-16 DIAGNOSIS — I10 Essential (primary) hypertension: Secondary | ICD-10-CM

## 2014-05-16 DIAGNOSIS — E785 Hyperlipidemia, unspecified: Secondary | ICD-10-CM

## 2014-05-16 DIAGNOSIS — G43101 Migraine with aura, not intractable, with status migrainosus: Secondary | ICD-10-CM

## 2014-05-16 DIAGNOSIS — Z23 Encounter for immunization: Secondary | ICD-10-CM

## 2014-05-16 DIAGNOSIS — R7303 Prediabetes: Secondary | ICD-10-CM

## 2014-05-16 DIAGNOSIS — R7309 Other abnormal glucose: Secondary | ICD-10-CM

## 2014-05-16 DIAGNOSIS — R109 Unspecified abdominal pain: Secondary | ICD-10-CM

## 2014-05-16 DIAGNOSIS — R197 Diarrhea, unspecified: Secondary | ICD-10-CM

## 2014-05-16 LAB — CBC WITH DIFFERENTIAL/PLATELET
Basophils Absolute: 0.1 10*3/uL (ref 0.0–0.1)
Basophils Relative: 1 % (ref 0–1)
EOS PCT: 4 % (ref 0–5)
Eosinophils Absolute: 0.3 10*3/uL (ref 0.0–0.7)
HCT: 37.6 % (ref 36.0–46.0)
HEMOGLOBIN: 12.2 g/dL (ref 12.0–15.0)
LYMPHS ABS: 2.7 10*3/uL (ref 0.7–4.0)
LYMPHS PCT: 43 % (ref 12–46)
MCH: 27.7 pg (ref 26.0–34.0)
MCHC: 32.4 g/dL (ref 30.0–36.0)
MCV: 85.5 fL (ref 78.0–100.0)
MONO ABS: 0.4 10*3/uL (ref 0.1–1.0)
MPV: 11.8 fL (ref 9.4–12.4)
Monocytes Relative: 7 % (ref 3–12)
Neutro Abs: 2.8 10*3/uL (ref 1.7–7.7)
Neutrophils Relative %: 45 % (ref 43–77)
Platelets: 227 10*3/uL (ref 150–400)
RBC: 4.4 MIL/uL (ref 3.87–5.11)
RDW: 14.4 % (ref 11.5–15.5)
WBC: 6.3 10*3/uL (ref 4.0–10.5)

## 2014-05-16 MED ORDER — NAPROXEN 500 MG PO TABS: ORAL_TABLET | ORAL | Status: DC

## 2014-05-16 MED ORDER — VENLAFAXINE HCL ER 75 MG PO CP24: 75.0000 mg | ORAL_CAPSULE | Freq: Every day | ORAL | Status: DC

## 2014-05-16 MED ORDER — OXYCODONE-ACETAMINOPHEN 5-325 MG PO TABS: 1.0000 | ORAL_TABLET | ORAL | Status: DC | PRN

## 2014-05-16 MED ORDER — CLORAZEPATE DIPOTASSIUM 3.75 MG PO TABS: ORAL_TABLET | ORAL | Status: DC

## 2014-05-16 MED ORDER — TRAMADOL HCL 50 MG PO TABS: ORAL_TABLET | ORAL | Status: DC

## 2014-05-16 MED ORDER — TOPIRAMATE ER 200 MG PO CAP24: ORAL_CAPSULE | ORAL | Status: DC

## 2014-05-16 NOTE — Progress Notes (Signed)
   Subjective:    Patient ID: Andrea [name], female    DOB: [birth year], 33 y.o.   MRN: [id number]  Hypertension This is a chronic problem. The current episode started more than 1 year ago. Pertinent negatives include no chest pain or shortness of breath. Treatments tried: amlodipine.  Med check up. Needs all meds. Requesting 90 day supply.   Having diarrhea after eating for the past four weeks.  She denies bloody stools denies vomiting denies severe abdominal pain mainly epigastric midabdominal pain with eating that results in loose stools she has history of cholecystectomy in the past Flu vaccine today.   Review of Systems  Constitutional: Negative for activity change, appetite change and fatigue.  HENT: Negative for congestion.   Respiratory: Negative for cough and shortness of breath.   Cardiovascular: Negative for chest pain.  Endocrine: Negative for polydipsia and polyphagia.  Genitourinary: Negative for frequency.  Neurological: Negative for weakness.  Psychiatric/Behavioral: Negative for confusion.       Objective:   Physical Exam  Constitutional: She appears well-nourished. No distress.  Cardiovascular: Normal rate, regular rhythm and normal heart sounds.   No murmur heard. Pulmonary/Chest: Effort normal and breath sounds normal. No respiratory distress.  Musculoskeletal: She exhibits no edema.  Lymphadenopathy:    She has no cervical adenopathy.  Neurological: She is alert. She exhibits normal muscle tone.  Psychiatric: Her behavior is normal.  Vitals reviewed.         Assessment & Plan:  #1 HTN decent control currently I gave her prescription for amlodipine 5 mg she will cut these in half. She will use sees half daily.  #2 she will be getting a new doctor after she moves to [facility] in January. I told her we would help her out initially until she gets a new doctor but she needs to work toward this  #3 refills on medications were given.  #4 watery stools  diarrhea abdominal discomfort every time with eating. I doubt that this is due to NSAIDs. It is possible this could be due to celiac disease. Also I doubt lactose intolerance, she does not use much milk products.

## 2014-05-17 LAB — TISSUE TRANSGLUTAMINASE, IGA: TISSUE TRANSGLUTAMINASE AB, IGA: 1 U/mL (ref <4)

## 2014-05-17 LAB — HEPATIC FUNCTION PANEL
ALT: 28 U/L (ref 0–35)
AST: 19 U/L (ref 0–37)
Albumin: 4.1 g/dL (ref 3.5–5.2)
Alkaline Phosphatase: 57 U/L (ref 39–117)
BILIRUBIN INDIRECT: 0.1 mg/dL — AB (ref 0.2–1.2)
BILIRUBIN TOTAL: 0.2 mg/dL (ref 0.2–1.2)
Bilirubin, Direct: 0.1 mg/dL (ref 0.0–0.3)
Total Protein: 6.6 g/dL (ref 6.0–8.3)

## 2014-05-17 LAB — HEMOGLOBIN A1C
Hgb A1c MFr Bld: 5.8 % — ABNORMAL HIGH (ref <5.7)
MEAN PLASMA GLUCOSE: 120 mg/dL — AB (ref <117)

## 2014-05-17 LAB — LIPASE: Lipase: 37 U/L (ref 0–75)

## 2014-05-18 ENCOUNTER — Other Ambulatory Visit: Payer: Self-pay | Admitting: Family Medicine

## 2014-05-19 LAB — CLOSTRIDIUM DIFFICILE BY PCR: Toxigenic C. Difficile by PCR: NOT DETECTED

## 2014-05-19 LAB — C. DIFFICILE GDH AND TOXIN A/B
C. difficile GDH: DETECTED — AB
C. difficile Toxin A/B: NOT DETECTED

## 2014-05-20 ENCOUNTER — Other Ambulatory Visit: Payer: Self-pay | Admitting: Family Medicine

## 2014-05-20 MED ORDER — METRONIDAZOLE 500 MG PO TABS: 500.0000 mg | ORAL_TABLET | Freq: Three times a day (TID) | ORAL | Status: DC

## 2014-05-22 LAB — STOOL CULTURE

## 2014-05-26 ENCOUNTER — Encounter: Payer: Self-pay | Admitting: Family Medicine

## 2014-05-27 ENCOUNTER — Telehealth: Payer: BC Managed Care – PPO | Admitting: Family

## 2014-05-27 DIAGNOSIS — J019 Acute sinusitis, unspecified: Secondary | ICD-10-CM

## 2014-05-27 MED ORDER — AMOXICILLIN-POT CLAVULANATE 875-125 MG PO TABS
1.0000 | ORAL_TABLET | Freq: Two times a day (BID) | ORAL | Status: DC
Start: 2014-05-27 — End: 2014-07-02

## 2014-05-27 NOTE — Progress Notes (Signed)

## 2014-05-27 NOTE — Telephone Encounter (Signed)
i am sympathetic to the patient but these meds can be Rx as #270 of each this is well beyond what any doc would prescibe as prn muscle relaxers and nausea tablets. I can do a script but not that many zanaflex # 34 , zofran #40

## 2014-06-01 MED ORDER — TIZANIDINE HCL 4 MG PO TABS: 4.0000 mg | ORAL_TABLET | Freq: Every evening | ORAL | Status: DC | PRN

## 2014-06-01 MED ORDER — ONDANSETRON HCL 8 MG PO TABS: 8.0000 mg | ORAL_TABLET | Freq: Three times a day (TID) | ORAL | Status: DC | PRN

## 2014-06-01 NOTE — Telephone Encounter (Signed)
Rxs sent electronically to pharmacy. Patient notified via My chart.

## 2014-06-01 NOTE — Telephone Encounter (Signed)
Will be fine to go ahead with the prescription of Zanaflex 4 mg, 1 3 times a day when necessary muscle spasm caution drowsiness #90 and Zofran 8 mg 1 3 times a day when necessary nausea #40 thank you

## 2014-06-01 NOTE — Addendum Note (Signed)
Addended by: Dairl Ponder on: 06/01/2014 05:05 PM   Modules accepted: Orders

## 2014-06-01 NOTE — Telephone Encounter (Signed)
#  1 please make sure that the medication was sent in #2 notify the patient so she is aware that this was sent. #3 send me additional messages if I really need to give further input thank you

## 2014-06-04 ENCOUNTER — Telehealth: Payer: BC Managed Care – PPO | Admitting: Family

## 2014-06-04 DIAGNOSIS — J069 Acute upper respiratory infection, unspecified: Secondary | ICD-10-CM

## 2014-06-04 DIAGNOSIS — R05 Cough: Secondary | ICD-10-CM

## 2014-06-04 DIAGNOSIS — R059 Cough, unspecified: Secondary | ICD-10-CM

## 2014-06-04 MED ORDER — BENZONATATE 100 MG PO CAPS: 100.0000 mg | ORAL_CAPSULE | Freq: Three times a day (TID) | ORAL | Status: DC | PRN

## 2014-06-04 MED ORDER — AZITHROMYCIN 250 MG PO TABS: ORAL_TABLET | ORAL | Status: DC

## 2014-06-04 NOTE — Progress Notes (Signed)
We are sorry that you are not feeling well.  Here is how we plan to help!  Based on what you have shared with me it looks like you have upper respiratory tract inflammation that has resulted in a signification cough.  Inflammation and infection in the upper respiratory tract is commonly called bronchitis and has four common causes:  Allergies, Viral Infections, Acid Reflux and Bacterial Infections.  Allergies, viruses and acid reflux are treated by controlling symptoms or eliminating the cause. An example might be a cough caused by taking certain blood pressure medications. You stop the cough by changing the medication. Another example might be a cough caused by acid reflux. Controlling the reflux helps control the cough.  Based on your presentation I believe you most likely have A cough due to bacteria.  When patients have a fever and a productive cough with a change in color or increased sputum production, we are concerned about bacterial bronchitis.  If left untreated it can progress to pneumonia.  If your symptoms do not improve with your treatment plan it is important that you contact your provider.   I have prescribed Azithromyin 250 mg: two tables now and then one tablet daily for 4 additonal days   In addition you may use A prescription cough medication called Tessalon Perles 100mg . You may take 1-2 capsules every 8 hours as needed for your cough.    HOME CARE . Only take medications as instructed by your medical team. . Complete the entire course of an antibiotic. . Drink plenty of fluids and get plenty of rest. . Avoid close contacts especially the very young and the elderly . Cover your mouth if you cough or cough into your sleeve. . Always remember to wash your hands . A steam or ultrasonic humidifier can help congestion.    GET HELP RIGHT AWAY IF: . You develop worsening fever. . You become short of breath . You cough up blood. . Your symptoms persist after you have completed your  treatment plan MAKE SURE YOU   Understand these instructions.  Will watch your condition.  Will get help right away if you are not doing well or get worse.  Your e-visit answers were reviewed by a board certified advanced clinical practitioner to complete your personal care plan.  Depending on the condition, your plan could have included both over the counter or prescription medications.  Please review your pharmacy choice.  If there is a problem, you may call our nursing hot line at [phone] and have the prescription routed to another pharmacy.  Your safety is important to [place].  If you have drug allergies check your prescription carefully.    You can use MyChart to ask questions about today's visit, request a non-urgent call back, or ask for a work or school excuse.  You will get an e-mail in the next two days asking about your experience.  I hope that your e-visit has been valuable and will speed your recovery. Thank you for using e-visits.

## 2014-06-07 ENCOUNTER — Telehealth: Payer: Self-pay | Admitting: Family Medicine

## 2014-06-07 NOTE — Telephone Encounter (Deleted)
R

## 2014-06-07 NOTE — Telephone Encounter (Signed)
Rx Prior Auth APPROVED for pt's ondansetron (ZOFRAN) 8 MG tablet, case# [id number], expires 06/07/2015 through [employer], faxed approval to [facility]

## 2014-06-13 ENCOUNTER — Telehealth: Payer: BC Managed Care – PPO | Admitting: Family

## 2014-06-13 DIAGNOSIS — S3992XA Unspecified injury of lower back, initial encounter: Secondary | ICD-10-CM

## 2014-06-13 DIAGNOSIS — M545 Low back pain: Secondary | ICD-10-CM

## 2014-06-13 MED ORDER — ETODOLAC 300 MG PO CAPS: 300.0000 mg | ORAL_CAPSULE | Freq: Two times a day (BID) | ORAL | Status: DC

## 2014-06-13 MED ORDER — CYCLOBENZAPRINE HCL 10 MG PO TABS: 10.0000 mg | ORAL_TABLET | Freq: Three times a day (TID) | ORAL | Status: DC | PRN

## 2014-06-13 NOTE — Progress Notes (Signed)
We are sorry that you are not feeling well.  Here is how we plan to help!  Based on what you have shared with me it looks like you mostly have acute back pain.  Acute back pain is defined as musculoskeletal pain that can resolve in 1-3 weeks with conservative treatment.  I have prescribed Etodolac 300 mg twice a day non-steroid anti-inflammatory (NSAID) as well as Flexeril 10 mg every eight hours as needed which is a muscle relaxer.  Some patients experience stomach irritation or in increased heartburn with anti-inflammatory drugs.  Please keep in mind that muscle relaxer's can cause fatigue and should not be taken while at work or driving.  Back pain is very common.  The pain often gets better over time.  The cause of back pain is usually not dangerous.  Most people can learn to manage their back pain on their own.  Home Care  Stay active.  Start with short walks on flat ground if you can.  Try to walk farther each day.  Do not sit, drive or stand in one place for more than 30 minutes.  Do not stay in bed.  Do not avoid exercise or work.  Activity can help your back heal faster.  Be careful when you bend or lift an object.  Bend at your knees, keep the object close to you, and do not twist.  Sleep on a firm mattress.  Lie on your side, and bend your knees.  If you lie on your back, put a pillow under your knees.  Only take medicines as told by your doctor.  Put ice on the injured area.  Put ice in a plastic bag  Place a towel between your skin and the bag  Leave the ice on for 15-20 minutes, 3-4 times a day for the first 2-3 days.  After that, you can switch between ice and heat packs.  Ask your doctor about back exercises or massage.  Avoid feeling anxious or stressed.  Find good ways to deal with stress, such as exercise.  Get Help Right Way If:  Your pain does not go away with rest or medicine.  Your pain does not go away in 1 week.  You have new problems.  You do not  feel well.  The pain spreads into your legs.  You cannot control when you poop (bowel movement) or pee (urinate)  You feel sick to your stomach (nauseous) or throw up (vomit)  You have belly (abdominal) pain.  You feel like you may pass out (faint).  If you develop a fever.  Make Sure you:  Understand these instructions.  Will watch your condition  Will get help right away if you are not doing well or get worse.  Your e-visit answers were reviewed by a board certified advanced clinical practitioner to complete your personal care plan.  Depending on the condition, your plan could have included both over the counter or prescription medications.  If there is a problem please reply  once you have received a response from your provider.  Your safety is important to [place].  If you have drug allergies check your prescription carefully.    You can use MyChart to ask questions about today's visit, request a non-urgent call back, or ask for a work or school excuse.  You will get an e-mail in the next two days asking about your experience.  I hope that your e-visit has been valuable and will speed your recovery. Thank you  for using e-visits.

## 2014-06-14 HISTORY — PX: INTRAUTERINE DEVICE (IUD) INSERTION: SHX5877

## 2014-06-23 ENCOUNTER — Encounter: Payer: Self-pay | Admitting: Family Medicine

## 2014-07-02 ENCOUNTER — Telehealth: Payer: BC Managed Care – PPO | Admitting: Physician Assistant

## 2014-07-02 DIAGNOSIS — B9689 Other specified bacterial agents as the cause of diseases classified elsewhere: Secondary | ICD-10-CM

## 2014-07-02 DIAGNOSIS — J208 Acute bronchitis due to other specified organisms: Principal | ICD-10-CM

## 2014-07-02 MED ORDER — DOXYCYCLINE HYCLATE 100 MG PO CAPS
100.0000 mg | ORAL_CAPSULE | Freq: Two times a day (BID) | ORAL | Status: DC
Start: 2014-07-02 — End: 2015-10-25

## 2014-07-02 MED ORDER — PROMETHAZINE-DM 6.25-15 MG/5ML PO SYRP: 5.0000 mL | ORAL_SOLUTION | Freq: Four times a day (QID) | ORAL | Status: DC | PRN

## 2014-07-02 NOTE — Progress Notes (Signed)
We are sorry that you are not feeling well.  Here is how we plan to help!  Based on what you have shared with me it looks like you have upper respiratory tract inflammation that has resulted in a signification cough.  Inflammation and infection in the upper respiratory tract is commonly called bronchitis and has four common causes:  Allergies, Viral Infections, Acid Reflux and Bacterial Infections.  Allergies, viruses and acid reflux are treated by controlling symptoms or eliminating the cause. An example might be a cough caused by taking certain blood pressure medications. You stop the cough by changing the medication. Another example might be a cough caused by acid reflux. Controlling the reflux helps control the cough.  Based on your presentation I believe you most likely have A cough due to bacteria.  When patients have a fever and a productive cough with a change in color or increased sputum production, we are concerned about bacterial bronchitis.  If left untreated it can progress to pneumonia.  If your symptoms do not improve with your treatment plan it is important that you contact your provider.   I have prescribed Doxycycline 100 mg twice a day for 7 days.  Increase your fluid intake.  Use the promethazine-DM cough syrup I have sent in for cough. You have been seen multiple times for respiratory illness through e-visit.  If symptoms are not improving with the regimen I am giving you, you need to go see your Primary Medical Provider for evaluation.      HOME CARE . Only take medications as instructed by your medical team. . Complete the entire course of an antibiotic. . Drink plenty of fluids and get plenty of rest. . Avoid close contacts especially the very young and the elderly . Cover your mouth if you cough or cough into your sleeve. . Always remember to wash your hands . A steam or ultrasonic humidifier can help congestion.    GET HELP RIGHT AWAY IF: . You develop worsening  fever. . You become short of breath . You cough up blood. . Your symptoms persist after you have completed your treatment plan MAKE SURE YOU   Understand these instructions.  Will watch your condition.  Will get help right away if you are not doing well or get worse.  Your e-visit answers were reviewed by a board certified advanced clinical practitioner to complete your personal care plan.  Depending on the condition, your plan could have included both over the counter or prescription medications.  If there is a problem please reply  once you have received a response from your provider.  Your safety is important to [place].  If you have drug allergies check your prescription carefully.    You can use MyChart to ask questions about today's visit, request a non-urgent call back, or ask for a work or school excuse.  You will get an e-mail in the next two days asking about your experience.  I hope that your e-visit has been valuable and will speed your recovery. Thank you for using e-visits.

## 2014-07-18 ENCOUNTER — Telehealth: Payer: Self-pay | Admitting: Family

## 2014-07-18 DIAGNOSIS — N390 Urinary tract infection, site not specified: Secondary | ICD-10-CM

## 2014-07-18 MED ORDER — NITROFURANTOIN MONOHYD MACRO 100 MG PO CAPS: 100.0000 mg | ORAL_CAPSULE | Freq: Two times a day (BID) | ORAL | Status: DC

## 2014-07-18 NOTE — Progress Notes (Signed)
We are sorry that you are not feeling well.  Here is how we plan to help!  Based on what you shared with me it looks like you most likely have a simple urinary tract infection.  A UTI (Urinary Tract Infection) is a bacterial infection of the bladder.  Most cases of urinary tract infections are simple to treat but a key part of your care is to encourage you to drink plenty of fluids and watch your symptoms carefully.  I have prescribed MacroBid 100 mg twice a day for 5 days.  Your symptoms should gradually improve. Call us if the burning in your urine worsens, you develop worsening fever, back pain or pelvic pain or if your symptoms do not resolve after completing the antibiotic.  Urinary tract infections can be prevented by drinking plenty of water to keep your body hydrated.  Also be sure when you wipe, wipe from front to back and don't hold it in!  If possible, empty your bladder every 4 hours.  Your e-visit answers were reviewed by a board certified advanced clinical practitioner to complete your personal care plan.  Depending on the condition, your plan could have included both over the counter or prescription medications.  If there is a problem please reply  once you have received a response from your provider.  Your safety is important to [place].  If you have drug allergies check your prescription carefully.    You can use MyChart to ask questions about today's visit, request a non-urgent call back, or ask for a work or school excuse.  You will get an e-mail in the next two days asking about your experience.  I hope that your e-visit has been valuable and will speed your recovery. Thank you for using e-visits.

## 2014-10-22 ENCOUNTER — Telehealth: Payer: Self-pay | Admitting: Family

## 2014-10-22 DIAGNOSIS — N390 Urinary tract infection, site not specified: Secondary | ICD-10-CM

## 2014-10-22 MED ORDER — CIPROFLOXACIN HCL 500 MG PO TABS: 500.0000 mg | ORAL_TABLET | Freq: Two times a day (BID) | ORAL | Status: DC

## 2014-10-22 NOTE — Progress Notes (Signed)
We are sorry that you are not feeling well.  Here is how we plan to help!  Based on what you shared with me it looks like you most likely have a simple urinary tract infection.  A UTI (Urinary Tract Infection) is a bacterial infection of the bladder.  Most cases of urinary tract infections are simple to treat but a key part of your care is to encourage you to drink plenty of fluids and watch your symptoms carefully.  I have prescribed Ciprofloxacin 500 mg twice a day for 5 days.  Your symptoms should gradually improve. Call us if the burning in your urine worsens, you develop worsening fever, back pain or pelvic pain or if your symptoms do not resolve after completing the antibiotic.  It looks like you have an old prescription for Tizanidine and have been switched to Flexeril. If you have any old Tizanidine, do not take this with Cipro. It looks like it is an old order from what we can tell.   Urinary tract infections can be prevented by drinking plenty of water to keep your body hydrated.  Also be sure when you wipe, wipe from front to back and don't hold it in!  If possible, empty your bladder every 4 hours.  Your e-visit answers were reviewed by a board certified advanced clinical practitioner to complete your personal care plan.  Depending on the condition, your plan could have included both over the counter or prescription medications.  If there is a problem please reply  once you have received a response from your provider.  Your safety is important to [place].  If you have drug allergies check your prescription carefully.    You can use MyChart to ask questions about today's visit, request a non-urgent call back, or ask for a work or school excuse.  You will get an e-mail in the next two days asking about your experience.  I hope that your e-visit has been valuable and will speed your recovery. Thank you for using e-visits.

## 2014-12-08 ENCOUNTER — Telehealth: Payer: BC Managed Care – PPO | Admitting: Physician Assistant

## 2014-12-08 DIAGNOSIS — J019 Acute sinusitis, unspecified: Secondary | ICD-10-CM

## 2014-12-08 MED ORDER — AMOXICILLIN-POT CLAVULANATE 875-125 MG PO TABS: 1.0000 | ORAL_TABLET | Freq: Two times a day (BID) | ORAL | Status: DC

## 2014-12-08 NOTE — Progress Notes (Signed)

## 2014-12-13 ENCOUNTER — Telehealth: Payer: Self-pay | Admitting: Nurse Practitioner

## 2014-12-13 DIAGNOSIS — M545 Low back pain, unspecified: Secondary | ICD-10-CM

## 2014-12-13 DIAGNOSIS — S3992XA Unspecified injury of lower back, initial encounter: Secondary | ICD-10-CM

## 2014-12-13 MED ORDER — NAPROXEN 500 MG PO TABS: ORAL_TABLET | ORAL | Status: DC

## 2014-12-13 MED ORDER — CYCLOBENZAPRINE HCL 10 MG PO TABS: 10.0000 mg | ORAL_TABLET | Freq: Three times a day (TID) | ORAL | Status: DC | PRN

## 2014-12-13 NOTE — Progress Notes (Signed)
We are sorry that you are not feeling well.  Here is how we plan to help!  Based on what you have shared with me it looks like you mostly have acute back pain.  Acute back pain is defined as musculoskeletal pain that can resolve in 1-3 weeks with conservative treatment.  I have prescribed Naprosyn 500 mg twice a day non-steroid anti-inflammatory (NSAID) as well as Flexeril 10 mg every eight hours as needed which is a muscle relaxer.  Some patients experience stomach irritation or in increased heartburn with anti-inflammatory drugs.  Please keep in mind that muscle relaxer's can cause fatigue and should not be taken while at work or driving.  Back pain is very common.  The pain often gets better over time.  The cause of back pain is usually not dangerous.  Most people can learn to manage their back pain on their own.  Home Care  Stay active.  Start with short walks on flat ground if you can.  Try to walk farther each day.  Do not sit, drive or stand in one place for more than 30 minutes.  Do not stay in bed.  Do not avoid exercise or work.  Activity can help your back heal faster.  Be careful when you bend or lift an object.  Bend at your knees, keep the object close to you, and do not twist.  Sleep on a firm mattress.  Lie on your side, and bend your knees.  If you lie on your back, put a pillow under your knees.  Only take medicines as told by your doctor.  Put ice on the injured area.  Put ice in a plastic bag  Place a towel between your skin and the bag  Leave the ice on for 15-20 minutes, 3-4 times a day for the first 2-3 days.  After that, you can switch between ice and heat packs.  Ask your doctor about back exercises or massage.  Avoid feeling anxious or stressed.  Find good ways to deal with stress, such as exercise.  Get Help Right Way If:  Your pain does not go away with rest or medicine.  Your pain does not go away in 1 week.  You have new problems.  You do not  feel well.  The pain spreads into your legs.  You cannot control when you poop (bowel movement) or pee (urinate)  You feel sick to your stomach (nauseous) or throw up (vomit)  You have belly (abdominal) pain.  You feel like you may pass out (faint).  If you develop a fever.  Make Sure you:  Understand these instructions.  Will watch your condition  Will get help right away if you are not doing well or get worse.  Your e-visit answers were reviewed by a board certified advanced clinical practitioner to complete your personal care plan.  Depending on the condition, your plan could have included both over the counter or prescription medications.  If there is a problem please reply  once you have received a response from your provider.  Your safety is important to [place].  If you have drug allergies check your prescription carefully.    You can use MyChart to ask questions about today's visit, request a non-urgent call back, or ask for a work or school excuse.  You will get an e-mail in the next two days asking about your experience.  I hope that your e-visit has been valuable and will speed your recovery. Thank you  for using e-visits.

## 2015-01-24 ENCOUNTER — Telehealth: Payer: BC Managed Care – PPO | Admitting: Nurse Practitioner

## 2015-01-24 DIAGNOSIS — M5441 Lumbago with sciatica, right side: Secondary | ICD-10-CM

## 2015-01-24 DIAGNOSIS — M5442 Lumbago with sciatica, left side: Principal | ICD-10-CM

## 2015-01-24 DIAGNOSIS — J0100 Acute maxillary sinusitis, unspecified: Secondary | ICD-10-CM

## 2015-01-24 MED ORDER — AZITHROMYCIN 250 MG PO TABS: ORAL_TABLET | ORAL | Status: DC

## 2015-01-24 NOTE — Progress Notes (Signed)
Based on what you shared with me it looks like you have a serious condition that should be evaluated in a face to face office visit. Unfortunately all i can rx on e visit for bACK PAIN IS NAPROSYN and u said that does not help. If you are having a true medical emergency please call 911.  If you need an urgent face to face visit, [facility] has four urgent care centers for your convenience.  . [facility] a Provider at this Location  [address] [place], [place] [address] . 8 am to 8 pm Monday-Friday . 9 am to 7 pm Saturday-Sunday  . [facility] [place] a Provider at this Location  [address] [place], [place], [place] [address] . 8 am to 8 pm Monday-Friday . 9 am to 6 pm Saturday . 11 am to 6 pm Sunday   . [facility] at [place] Get Driving Directions  [address] [place], [place] [address] . 8 am to 8 pm Monday-Friday . 9 am to 4 pm Saturday-Sunday   . [facility] (a walk in primary care provider)  [place] a Provider at this Location  [place], [place] [address] . 8 am to 8:30 pm Monday-Thursday . 8 am to 6 pm Friday . 8 am to 4 pm Saturday-Sunday   Your e-visit answers were reviewed by a board certified advanced clinical practitioner to complete your personal care plan.  Depending on the condition, your plan could have included both over the counter or prescription medications.  You will get an e-mail in the next two days asking about your experience.  I hope that your e-visit has been valuable and will speed your recovery . Thank you for choosing an e-visit.

## 2015-01-24 NOTE — Progress Notes (Signed)

## 2015-10-25 ENCOUNTER — Other Ambulatory Visit: Payer: Self-pay | Admitting: Obstetrics and Gynecology

## 2015-11-01 ENCOUNTER — Encounter (HOSPITAL_COMMUNITY): Payer: Self-pay

## 2015-11-01 ENCOUNTER — Encounter (HOSPITAL_COMMUNITY)
Admission: RE | Admit: 2015-11-01 | Discharge: 2015-11-01 | Disposition: A | Discharge status: Home / Self Care | Payer: Managed Care, Other (non HMO) | Source: Ambulatory Visit | Attending: Obstetrics and Gynecology | Admitting: Obstetrics and Gynecology

## 2015-11-01 DIAGNOSIS — Z029 Encounter for administrative examinations, unspecified: Secondary | ICD-10-CM | POA: Insufficient documentation

## 2015-11-01 HISTORY — DX: Depression, unspecified: F32.A

## 2015-11-01 HISTORY — DX: Nausea with vomiting, unspecified: R11.2

## 2015-11-01 HISTORY — DX: Anxiety disorder, unspecified: F41.9

## 2015-11-01 HISTORY — DX: Adverse effect of unspecified anesthetic, initial encounter: T41.45XA

## 2015-11-01 HISTORY — DX: Nausea with vomiting, unspecified: Z98.890

## 2015-11-01 HISTORY — DX: Reserved for inherently not codable concepts without codable children: IMO0001

## 2015-11-01 HISTORY — DX: Other specified postprocedural states: Z98.890

## 2015-11-01 HISTORY — DX: Major depressive disorder, single episode, unspecified: F32.9

## 2015-11-01 HISTORY — DX: Other complications of anesthesia, initial encounter: T88.59XA

## 2015-11-01 LAB — CBC
HCT: 42.2 % (ref 36.0–46.0)
Hemoglobin: 13.9 g/dL (ref 12.0–15.0)
MCH: 29.6 pg (ref 26.0–34.0)
MCHC: 32.9 g/dL (ref 30.0–36.0)
MCV: 89.8 fL (ref 78.0–100.0)
PLATELETS: 231 10*3/uL (ref 150–400)
RBC: 4.7 MIL/uL (ref 3.87–5.11)
RDW: 12.9 % (ref 11.5–15.5)
WBC: 8.1 10*3/uL (ref 4.0–10.5)

## 2015-11-01 NOTE — Patient Instructions (Addendum)
Your procedure is scheduled on: 11/14/15  Enter through the Main Entrance at :10 am Pick up desk phone and dial [phone] and inform us of your arrival.  Please call [phone] if you have any problems the morning of surgery.  Remember: Do not eat food after midnight:Monday Clear liquids are ok until:7am   You may brush your teeth the morning of surgery.  Take these meds the morning of surgery with a sip of water:Bring inhaler to hospital, Xanax if needed, Flexeril if needed, Allegra if needed, Effexor, Topamax  DO NOT wear jewelry, eye make-up, lipstick,body lotion, or dark fingernail polish.  (Polished toes are ok) You may wear deodorant.  If you are to be admitted after surgery, leave suitcase in car until your room has been assigned.  Wear loose fitting, comfortable clothes for your ride home.

## 2015-11-13 MED ORDER — DEXTROSE 5 % IV SOLN
3.0000 g | INTRAVENOUS | Status: AC
  Administered 2015-11-14: 3 g via INTRAVENOUS
  Filled 2015-11-13: qty 3000

## 2015-11-14 ENCOUNTER — Encounter (HOSPITAL_COMMUNITY): Payer: Self-pay

## 2015-11-14 ENCOUNTER — Inpatient Hospital Stay (HOSPITAL_COMMUNITY): Payer: Managed Care, Other (non HMO) | Admitting: Anesthesiology

## 2015-11-14 ENCOUNTER — Inpatient Hospital Stay (HOSPITAL_COMMUNITY)
Admission: RE | Admit: 2015-11-14 | Discharge: 2015-11-16 | DRG: 743 | Disposition: A | Discharge status: Home / Self Care | Payer: Managed Care, Other (non HMO) | Source: Ambulatory Visit | Attending: Obstetrics and Gynecology | Admitting: Obstetrics and Gynecology

## 2015-11-14 ENCOUNTER — Encounter (HOSPITAL_COMMUNITY)
Admission: RE | Disposition: A | Discharge status: Home / Self Care | Payer: Self-pay | Source: Ambulatory Visit | Attending: Obstetrics and Gynecology

## 2015-11-14 DIAGNOSIS — E282 Polycystic ovarian syndrome: Secondary | ICD-10-CM | POA: Diagnosis present

## 2015-11-14 DIAGNOSIS — N803 Endometriosis of pelvic peritoneum: Principal | ICD-10-CM | POA: Diagnosis present

## 2015-11-14 DIAGNOSIS — K219 Gastro-esophageal reflux disease without esophagitis: Secondary | ICD-10-CM | POA: Diagnosis present

## 2015-11-14 DIAGNOSIS — Z883 Allergy status to other anti-infective agents status: Secondary | ICD-10-CM | POA: Diagnosis not present

## 2015-11-14 DIAGNOSIS — G8929 Other chronic pain: Secondary | ICD-10-CM | POA: Diagnosis present

## 2015-11-14 DIAGNOSIS — N9419 Other specified dyspareunia: Secondary | ICD-10-CM | POA: Diagnosis present

## 2015-11-14 DIAGNOSIS — R102 Pelvic and perineal pain: Secondary | ICD-10-CM | POA: Diagnosis present

## 2015-11-14 HISTORY — PX: ABDOMINAL HYSTERECTOMY: SHX81

## 2015-11-14 HISTORY — PX: SALPINGOOPHORECTOMY: SHX82

## 2015-11-14 LAB — PREGNANCY, URINE: PREG TEST UR: NEGATIVE

## 2015-11-14 SURGERY — HYSTERECTOMY, ABDOMINAL
Anesthesia: General | Site: Abdomen

## 2015-11-14 MED ORDER — LIDOCAINE HCL (CARDIAC) 20 MG/ML IV SOLN
INTRAVENOUS | Status: AC
  Filled 2015-11-14: qty 5

## 2015-11-14 MED ORDER — HYDROMORPHONE HCL 1 MG/ML IJ SOLN
INTRAMUSCULAR | Status: AC
  Administered 2015-11-14: 0.6 mg
  Filled 2015-11-14: qty 1

## 2015-11-14 MED ORDER — HYDROMORPHONE HCL 1 MG/ML IJ SOLN
0.2500 mg | INTRAMUSCULAR | Status: DC | PRN
  Administered 2015-11-14 (×6): 0.5 mg via INTRAVENOUS

## 2015-11-14 MED ORDER — HYDROMORPHONE HCL 1 MG/ML IJ SOLN
INTRAMUSCULAR | Status: AC
  Filled 2015-11-14: qty 1

## 2015-11-14 MED ORDER — ROCURONIUM BROMIDE 100 MG/10ML IV SOLN
INTRAVENOUS | Status: AC
  Filled 2015-11-14: qty 1

## 2015-11-14 MED ORDER — GLYCOPYRROLATE 0.2 MG/ML IJ SOLN
INTRAMUSCULAR | Status: AC
  Filled 2015-11-14: qty 1

## 2015-11-14 MED ORDER — FENTANYL CITRATE (PF) 250 MCG/5ML IJ SOLN
INTRAMUSCULAR | Status: AC
  Filled 2015-11-14: qty 5

## 2015-11-14 MED ORDER — SUGAMMADEX SODIUM 200 MG/2ML IV SOLN
INTRAVENOUS | Status: DC | PRN
  Administered 2015-11-14: 258.6 mg via INTRAVENOUS

## 2015-11-14 MED ORDER — TOPIRAMATE 100 MG PO TABS
200.0000 mg | ORAL_TABLET | Freq: Two times a day (BID) | ORAL | Status: DC
  Administered 2015-11-14 – 2015-11-16 (×4): 200 mg via ORAL
  Filled 2015-11-14 (×4): qty 2

## 2015-11-14 MED ORDER — SCOPOLAMINE 1 MG/3DAYS TD PT72
1.0000 | MEDICATED_PATCH | Freq: Once | TRANSDERMAL | Status: DC
  Administered 2015-11-14: 1.5 mg via TRANSDERMAL

## 2015-11-14 MED ORDER — ONDANSETRON HCL 4 MG/2ML IJ SOLN
INTRAMUSCULAR | Status: AC
  Filled 2015-11-14: qty 2

## 2015-11-14 MED ORDER — ROCURONIUM BROMIDE 100 MG/10ML IV SOLN
INTRAVENOUS | Status: DC | PRN
  Administered 2015-11-14: 10 mg via INTRAVENOUS
  Administered 2015-11-14: 20 mg via INTRAVENOUS
  Administered 2015-11-14: 70 mg via INTRAVENOUS

## 2015-11-14 MED ORDER — 0.9 % SODIUM CHLORIDE (POUR BTL) OPTIME
TOPICAL | Status: DC | PRN
  Administered 2015-11-14: 1000 mL

## 2015-11-14 MED ORDER — LACTATED RINGERS IV SOLN
INTRAVENOUS | Status: DC
  Administered 2015-11-14: 125 mL/h via INTRAVENOUS
  Administered 2015-11-14 (×2): via INTRAVENOUS

## 2015-11-14 MED ORDER — KETOROLAC TROMETHAMINE 30 MG/ML IJ SOLN
INTRAMUSCULAR | Status: DC | PRN
  Administered 2015-11-14: 30 mg via INTRAVENOUS

## 2015-11-14 MED ORDER — MIDAZOLAM HCL 2 MG/2ML IJ SOLN
INTRAMUSCULAR | Status: AC
  Filled 2015-11-14: qty 2

## 2015-11-14 MED ORDER — MIDAZOLAM HCL 2 MG/2ML IJ SOLN
INTRAMUSCULAR | Status: DC | PRN
  Administered 2015-11-14: 2 mg via INTRAVENOUS

## 2015-11-14 MED ORDER — LACTATED RINGERS IV SOLN
INTRAVENOUS | Status: DC
  Administered 2015-11-14: 1000 mL via INTRAVENOUS

## 2015-11-14 MED ORDER — ONDANSETRON HCL 4 MG/2ML IJ SOLN: 4.0000 mg | Freq: Four times a day (QID) | INTRAMUSCULAR | Status: DC | PRN

## 2015-11-14 MED ORDER — ONDANSETRON HCL 4 MG/2ML IJ SOLN
INTRAMUSCULAR | Status: DC | PRN
  Administered 2015-11-14: 4 mg via INTRAVENOUS

## 2015-11-14 MED ORDER — HYDROMORPHONE HCL 1 MG/ML IJ SOLN
0.5000 mg | INTRAMUSCULAR | Status: DC | PRN
  Administered 2015-11-14: 0.5 mg via INTRAVENOUS

## 2015-11-14 MED ORDER — DEXAMETHASONE SODIUM PHOSPHATE 10 MG/ML IJ SOLN
INTRAMUSCULAR | Status: DC | PRN
  Administered 2015-11-14: 4 mg via INTRAVENOUS

## 2015-11-14 MED ORDER — DEXTROSE IN LACTATED RINGERS 5 % IV SOLN
INTRAVENOUS | Status: DC
  Administered 2015-11-14 – 2015-11-15 (×2): via INTRAVENOUS

## 2015-11-14 MED ORDER — ACETAMINOPHEN 10 MG/ML IV SOLN
1000.0000 mg | Freq: Once | INTRAVENOUS | Status: AC
  Administered 2015-11-14: 1000 mg via INTRAVENOUS
  Filled 2015-11-14: qty 100

## 2015-11-14 MED ORDER — DOCUSATE SODIUM 100 MG PO CAPS
100.0000 mg | ORAL_CAPSULE | Freq: Two times a day (BID) | ORAL | Status: DC
  Administered 2015-11-14 – 2015-11-16 (×4): 100 mg via ORAL
  Filled 2015-11-14 (×4): qty 1

## 2015-11-14 MED ORDER — KETOROLAC TROMETHAMINE 30 MG/ML IJ SOLN
INTRAMUSCULAR | Status: AC
Start: 2015-11-14 — End: 2015-11-14
  Filled 2015-11-14: qty 1

## 2015-11-14 MED ORDER — PROPOFOL 10 MG/ML IV BOLUS
INTRAVENOUS | Status: AC
  Filled 2015-11-14: qty 20

## 2015-11-14 MED ORDER — HYDROMORPHONE HCL 1 MG/ML IJ SOLN
0.2000 mg | INTRAMUSCULAR | Status: DC | PRN
  Administered 2015-11-14: 0.6 mg via INTRAVENOUS
  Filled 2015-11-14 (×2): qty 1

## 2015-11-14 MED ORDER — LIDOCAINE HCL (CARDIAC) 20 MG/ML IV SOLN
INTRAVENOUS | Status: DC | PRN
  Administered 2015-11-14: 100 mg via INTRAVENOUS

## 2015-11-14 MED ORDER — SCOPOLAMINE 1 MG/3DAYS TD PT72
MEDICATED_PATCH | TRANSDERMAL | Status: AC
  Administered 2015-11-14: 1.5 mg via TRANSDERMAL
  Filled 2015-11-14: qty 1

## 2015-11-14 MED ORDER — HYDROMORPHONE HCL 2 MG PO TABS
4.0000 mg | ORAL_TABLET | ORAL | Status: DC | PRN
  Administered 2015-11-15 – 2015-11-16 (×6): 4 mg via ORAL
  Filled 2015-11-14 (×6): qty 2

## 2015-11-14 MED ORDER — GLYCOPYRROLATE 0.2 MG/ML IJ SOLN
INTRAMUSCULAR | Status: DC | PRN
  Administered 2015-11-14 (×2): 0.1 mg via INTRAVENOUS

## 2015-11-14 MED ORDER — SIMETHICONE 80 MG PO CHEW
80.0000 mg | CHEWABLE_TABLET | Freq: Four times a day (QID) | ORAL | Status: DC | PRN
  Filled 2015-11-14: qty 1

## 2015-11-14 MED ORDER — ONDANSETRON HCL 4 MG PO TABS: 4.0000 mg | ORAL_TABLET | Freq: Four times a day (QID) | ORAL | Status: DC | PRN

## 2015-11-14 MED ORDER — DEXAMETHASONE SODIUM PHOSPHATE 4 MG/ML IJ SOLN
INTRAMUSCULAR | Status: AC
  Filled 2015-11-14: qty 1

## 2015-11-14 MED ORDER — VENLAFAXINE HCL ER 150 MG PO CP24
150.0000 mg | ORAL_CAPSULE | Freq: Every day | ORAL | Status: DC
  Administered 2015-11-15 – 2015-11-16 (×2): 150 mg via ORAL
  Filled 2015-11-14 (×2): qty 1

## 2015-11-14 MED ORDER — FENTANYL CITRATE (PF) 100 MCG/2ML IJ SOLN
INTRAMUSCULAR | Status: DC | PRN
  Administered 2015-11-14: 100 ug via INTRAVENOUS
  Administered 2015-11-14: 50 ug via INTRAVENOUS
  Administered 2015-11-14: 100 ug via INTRAVENOUS
  Administered 2015-11-14 (×3): 50 ug via INTRAVENOUS

## 2015-11-14 MED ORDER — PROPOFOL 10 MG/ML IV BOLUS
INTRAVENOUS | Status: DC | PRN
  Administered 2015-11-14: 200 mg via INTRAVENOUS

## 2015-11-14 SURGICAL SUPPLY — 42 items
BENZOIN TINCTURE PRP APPL 2/3 (GAUZE/BANDAGES/DRESSINGS) ×4 IMPLANT
CANISTER SUCT 3000ML (MISCELLANEOUS) ×4 IMPLANT
CLOSURE WOUND 1/2 X4 (GAUZE/BANDAGES/DRESSINGS) ×1
CLOTH BEACON ORANGE TIMEOUT ST (SAFETY) ×4 IMPLANT
CONT PATH 16OZ SNAP LID 3702 (MISCELLANEOUS) ×4 IMPLANT
COVER LIGHT HANDLE  1/PK (MISCELLANEOUS) ×2
COVER LIGHT HANDLE 1/PK (MISCELLANEOUS) ×2 IMPLANT
DECANTER SPIKE VIAL GLASS SM (MISCELLANEOUS) IMPLANT
DRAPE CESAREAN BIRTH W POUCH (DRAPES) ×4 IMPLANT
DRAPE WARM FLUID 44X44 (DRAPE) ×4 IMPLANT
DRSG OPSITE POSTOP 4X10 (GAUZE/BANDAGES/DRESSINGS) ×4 IMPLANT
DURAPREP 26ML APPLICATOR (WOUND CARE) ×4 IMPLANT
ELECT BLADE 6.5 EXT (BLADE) ×4 IMPLANT
GAUZE SPONGE 4X4 16PLY XRAY LF (GAUZE/BANDAGES/DRESSINGS) ×4 IMPLANT
GLOVE BIOGEL PI IND STRL 7.0 (GLOVE) ×6 IMPLANT
GLOVE BIOGEL PI INDICATOR 7.0 (GLOVE) ×6
GLOVE ECLIPSE 7.0 STRL STRAW (GLOVE) ×8 IMPLANT
GOWN STRL REUS W/TWL LRG LVL3 (GOWN DISPOSABLE) ×24 IMPLANT
NEEDLE HYPO 22GX1.5 SAFETY (NEEDLE) IMPLANT
NS IRRIG 1000ML POUR BTL (IV SOLUTION) ×4 IMPLANT
PACK ABDOMINAL GYN (CUSTOM PROCEDURE TRAY) ×4 IMPLANT
PAD ABD 7.5X8 STRL (GAUZE/BANDAGES/DRESSINGS) ×4 IMPLANT
PAD OB MATERNITY 4.3X12.25 (PERSONAL CARE ITEMS) ×4 IMPLANT
PENCIL SMOKE EVAC W/HOLSTER (ELECTROSURGICAL) ×4 IMPLANT
SPONGE GAUZE 4X4 12PLY STER LF (GAUZE/BANDAGES/DRESSINGS) ×4 IMPLANT
SPONGE LAP 18X18 X RAY DECT (DISPOSABLE) ×8 IMPLANT
STAPLER VISISTAT 35W (STAPLE) ×4 IMPLANT
STRIP CLOSURE SKIN 1/2X4 (GAUZE/BANDAGES/DRESSINGS) ×3 IMPLANT
SUT MNCRL 0 MO-4 VIOLET 18 CR (SUTURE) ×6 IMPLANT
SUT MNCRL 0 VIOLET 6X18 (SUTURE) ×2 IMPLANT
SUT MNCRL AB 0 CT1 27 (SUTURE) ×8 IMPLANT
SUT MON AB 2-0 CT1 27 (SUTURE) ×4 IMPLANT
SUT MONOCRYL 0 6X18 (SUTURE) ×2
SUT MONOCRYL 0 MO 4 18  CR/8 (SUTURE) ×6
SUT PLAIN 2 0 XLH (SUTURE) ×8 IMPLANT
SUT SILK 3 0 SH 30 (SUTURE) IMPLANT
SUT VIC AB 0 CT1 36 (SUTURE) ×20 IMPLANT
SYR CONTROL 10ML LL (SYRINGE) IMPLANT
TAPE CLOTH SURG 4X10 WHT LF (GAUZE/BANDAGES/DRESSINGS) ×4 IMPLANT
TOWEL OR 17X24 6PK STRL BLUE (TOWEL DISPOSABLE) ×8 IMPLANT
TRAY FOLEY CATH SILVER 14FR (SET/KITS/TRAYS/PACK) ×4 IMPLANT
WATER STERILE IRR 1000ML POUR (IV SOLUTION) IMPLANT

## 2015-11-14 NOTE — Addendum Note (Signed)
Addendum  created 11/14/15 2227 by Flossie Dibble, CRNA   Modules edited: Notes Section   Notes Section:  File: [id number]

## 2015-11-14 NOTE — Anesthesia Postprocedure Evaluation (Signed)
Anesthesia Post Note  Patient: Andrea [name]  Procedure(s) Performed: Procedure(s) (LRB): HYSTERECTOMY ABDOMINAL (N/A) SALPINGO OOPHORECTOMY (Bilateral)  Patient location during evaluation: PACU Anesthesia Type: General Level of consciousness: awake and alert Pain management: pain level controlled Vital Signs Assessment: post-procedure vital signs reviewed and stable Respiratory status: spontaneous breathing, nonlabored ventilation, respiratory function stable and patient connected to nasal cannula oxygen Cardiovascular status: blood pressure returned to baseline and stable Postop Assessment: no signs of nausea or vomiting Anesthetic complications: no     Last Vitals:  Filed Vitals:   11/14/15 1600 11/14/15 1624  BP: 150/76 151/83  Pulse: 58 61  Temp: 37.3 C 36.5 C  Resp: 16     Last Pain:  Filed Vitals:   11/14/15 1628  PainSc: 6    Pain Goal: Patients Stated Pain Goal: 2 (11/14/15 1624)               Catalina Gravel

## 2015-11-14 NOTE — H&P (Signed)
Andrea [name] is an 35 y.o. G27P0 white female who presents to the OR for a TAH possible BSO for chronic pelvic pain and endometriosis. She has a IUD in place. She has dysparunia. She has a h.o. A dermoid removal. She understands that this surgery may not resolve the pain, She also understands that she will not be able to become pregnant. She desires removal of ovaries if they are involved with endometrosis. She has nl GI/GU function HPI:  Past Medical History  Diagnosis Date  . Migraines   . PCOS (polycystic ovarian syndrome)   . Complication of anesthesia     hard time waking up   . PONV (postoperative nausea and vomiting)   . Shortness of breath dyspnea     with bronchitis  . Depression   . Anxiety   . Kidney stones   . GERD (gastroesophageal reflux disease)     no meds    Past Surgical History  Procedure Laterality Date  . Cholecystectomy    . Lymph nodes removed    . Ovarian cyst removed    . Intrauterine device (iud) insertion  06/14/2014    [facility]    History reviewed. No pertinent family history. Social History:  reports that she has never smoked. She does not have any smokeless tobacco history on file. She reports that she does not drink alcohol or use illicit drugs.  Allergies:  Allergies  Allergen Reactions  . Bactrim [Sulfamethoxazole-Trimethoprim] Itching  . Vicodin [Hydrocodone-Acetaminophen] Itching    Medications Prior to Admission  Medication Sig Dispense Refill  . ALPRAZolam (XANAX) 1 MG tablet Take 0.5-1 mg by mouth 2 (two) times daily as needed. For anxiety    . cyclobenzaprine (FLEXERIL) 10 MG tablet Take 1 tablet (10 mg total) by mouth 3 (three) times daily as needed for muscle spasms. 30 tablet 0  . fexofenadine (ALLEGRA) 180 MG tablet Take 180 mg by mouth daily as needed for allergies or rhinitis.    Marland Kitchen levonorgestrel (MIRENA) 20 MCG/24HR IUD 1 each by Intrauterine route continuous.    . Nutritional Supplements (JUICE PLUS FIBRE PO)  Take 4 each by mouth daily.    Marland Kitchen topiramate (TOPAMAX) 200 MG tablet Take 200 mg by mouth 2 (two) times daily.  3  . traMADol (ULTRAM) 50 MG tablet TAKE ONE TO TWO AS NEEDED FOR MIGRAINE. CAUTION DROWSINESS. NOT FOR FREQUENT USE. (Patient taking differently: Take 50 mg by mouth every 6 (six) hours as needed (migraine). CAUTION DROWSINESS. NOT FOR FREQUENT USE.) 60 tablet 4  . venlafaxine XR (EFFEXOR-XR) 150 MG 24 hr capsule Take 150 mg by mouth daily.  3  . albuterol (PROVENTIL HFA;VENTOLIN HFA) 108 (90 BASE) MCG/ACT inhaler Inhale 2 puffs into the lungs every 6 (six) hours as needed for wheezing. 1 Inhaler 2       Blood pressure 138/96, pulse 71, temperature 98.2 F (36.8 C), temperature source Oral, resp. rate 16, SpO2 100 %. General appearance: alert and cooperative Lungs: clear to auscultation bilaterally Heart: regular rate and rhythm, S1, S2 normal, no murmur, click, rub or gallop Abdomen: soft, non-tender; bowel sounds normal; no masses,  no organomegaly   Lab Results  Component Value Date   WBC 8.1 11/01/2015   HGB 13.9 11/01/2015   HCT 42.2 11/01/2015   MCV 89.8 11/01/2015   PLT 231 11/01/2015   Lab Results  Component Value Date   PREGTESTUR NEGATIVE 11/14/2015       Patient Active Problem List   Diagnosis  Date Noted  . Acute pyelonephritis 01/16/2013  . Migraines 10/30/2012  . Morbid obesity ([place]) 10/30/2012  . Depression with anxiety 10/30/2012  . Kidney stones    IMP/ Chronic pelvic pain. Endometriosis                           Plan/ Proceed with TAH possible BSO.Crewe E 11/14/2015, 11:14 AM

## 2015-11-14 NOTE — Anesthesia Preprocedure Evaluation (Signed)
Anesthesia Evaluation  Patient identified by MRN, date of birth, ID band Patient awake    Reviewed: Allergy & Precautions, H&P , NPO status , Patient's Chart, lab work & pertinent test results  Airway Mallampati: II  TM Distance: >3 FB Neck ROM: full    Dental no notable dental hx. (+) Dental Advisory Given, Teeth Intact   Pulmonary shortness of breath and with exertion,    Pulmonary exam normal breath sounds clear to auscultation       Cardiovascular Exercise Tolerance: Good negative cardio ROS Normal cardiovascular exam Rhythm:regular Rate:Normal     Neuro/Psych negative neurological ROS  negative psych ROS   GI/Hepatic negative GI ROS, Neg liver ROS, GERD  ,  Endo/Other  negative endocrine ROS  Renal/GU negative Renal ROS  negative genitourinary   Musculoskeletal   Abdominal (+) + obese,   Peds  Hematology negative hematology ROS (+)   Anesthesia Other Findings   Reproductive/Obstetrics negative OB ROS                             Anesthesia Physical Anesthesia Plan  ASA: II  Anesthesia Plan: General   Post-op Pain Management:    Induction: Intravenous  Airway Management Planned: Oral ETT  Additional Equipment:   Intra-op Plan:   Post-operative Plan: Extubation in OR  Informed Consent: I have reviewed the patients History and Physical, chart, labs and discussed the procedure including the risks, benefits and alternatives for the proposed anesthesia with the patient or authorized representative who has indicated his/her understanding and acceptance.   Dental Advisory Given  Plan Discussed with: CRNA and Surgeon  Anesthesia Plan Comments:         Anesthesia Quick Evaluation

## 2015-11-14 NOTE — Anesthesia Procedure Notes (Signed)
Procedure Name: Intubation Date/Time: 11/14/2015 11:41 AM Performed by: Hewitt Blade Pre-anesthesia Checklist: Patient identified, Patient being monitored, Emergency Drugs available and Suction available Patient Re-evaluated:Patient Re-evaluated prior to inductionOxygen Delivery Method: Circle system utilized Preoxygenation: Pre-oxygenation with 100% oxygen Intubation Type: IV induction Ventilation: Mask ventilation without difficulty and Oral airway inserted - appropriate to patient size Laryngoscope Size: Mac and 3 Grade View: Grade I Tube type: Oral Tube size: 7.0 mm Number of attempts: 1 Airway Equipment and Method: Stylet Placement Confirmation: ETT inserted through vocal cords under direct vision,  positive ETCO2 and breath sounds checked- equal and bilateral Secured at: 24 cm Tube secured with: Tape Dental Injury: Teeth and Oropharynx as per pre-operative assessment

## 2015-11-14 NOTE — Transfer of Care (Signed)
Immediate Anesthesia Transfer of Care Note  Patient: Andrea [name]  Procedure(s) Performed: Procedure(s): HYSTERECTOMY ABDOMINAL (N/A) SALPINGO OOPHORECTOMY (Bilateral)  Patient Location: PACU  Anesthesia Type:General  Level of Consciousness: awake, alert  and oriented  Airway & Oxygen Therapy: Patient Spontanous Breathing and Patient connected to nasal cannula oxygen  Post-op Assessment: Report given to RN, Post -op Vital signs reviewed and stable and Patient moving all extremities  Post vital signs: Reviewed and stable  Last Vitals:  Filed Vitals:   11/14/15 0948  BP: 138/96  Pulse: 71  Temp: 36.8 C  Resp: 16    Last Pain:  Filed Vitals:   11/14/15 0952  PainSc: 5       Patients Stated Pain Goal: 2 ([id number])  Complications: No apparent anesthesia complications

## 2015-11-14 NOTE — Anesthesia Postprocedure Evaluation (Signed)
Anesthesia Post Note  Patient: Andrea [name]  Procedure(s) Performed: Procedure(s) (LRB): HYSTERECTOMY ABDOMINAL (N/A) SALPINGO OOPHORECTOMY (Bilateral)  Patient location during evaluation: [facility] Anesthesia Type: General Level of consciousness: awake and alert Pain management: satisfactory to patient Vital Signs Assessment: post-procedure vital signs reviewed and stable Respiratory status: spontaneous breathing and respiratory function stable Cardiovascular status: stable Postop Assessment: adequate PO intake Anesthetic complications: no     Last Vitals:  Filed Vitals:   11/14/15 1706 11/14/15 2134  BP: 156/95 142/81  Pulse: 68 76  Temp:  37.1 C  Resp: 16 16    Last Pain:  Filed Vitals:   11/14/15 2223  PainSc: 0-No pain   Pain Goal: Patients Stated Pain Goal: 3 (11/14/15 2054)               Katherina Mires

## 2015-11-15 ENCOUNTER — Encounter (HOSPITAL_COMMUNITY): Payer: Self-pay | Admitting: Obstetrics and Gynecology

## 2015-11-15 LAB — CBC
HCT: 32.2 % — ABNORMAL LOW (ref 36.0–46.0)
Hemoglobin: 10.9 g/dL — ABNORMAL LOW (ref 12.0–15.0)
MCH: 29.9 pg (ref 26.0–34.0)
MCHC: 33.9 g/dL (ref 30.0–36.0)
MCV: 88.5 fL (ref 78.0–100.0)
Platelets: 204 K/uL (ref 150–400)
RBC: 3.64 MIL/uL — ABNORMAL LOW (ref 3.87–5.11)
RDW: 13 % (ref 11.5–15.5)
WBC: 9.8 K/uL (ref 4.0–10.5)

## 2015-11-15 MED ORDER — IBUPROFEN 600 MG PO TABS
600.0000 mg | ORAL_TABLET | Freq: Four times a day (QID) | ORAL | Status: DC | PRN
  Administered 2015-11-15 – 2015-11-16 (×3): 600 mg via ORAL
  Filled 2015-11-15 (×3): qty 1

## 2015-11-15 NOTE — Op Note (Signed)
Andrea [name], VERRAN            ACCOUNT NO.:  [id number]  MEDICAL RECORD NO.:  [id number]  LOCATION:  [id number]                          FACILITY:  [facility]  PHYSICIAN:  Freda Munro, M.D.    DATE OF BIRTH:  01/08/1981  DATE OF PROCEDURE:  11/14/2015 DATE OF DISCHARGE:                              OPERATIVE REPORT   PREOPERATIVE DIAGNOSES: 1. Endometriosis. 2. Severe pelvic pain.  POSTOPERATIVE DIAGNOSES: 1. Endometriosis. 2. Severe pelvic pain with bilateral endometriomas.  SURGEON:  Freda Munro, M.D.  ASSISTANTIleene Rubens.  ANESTHESIA:  General.  ANTIBIOTICS:  Ancef 3 g.  DRAINS:  Foley to bedside drainage.  ESTIMATED BLOOD LOSS:  200 mL.  SPECIMENS:  Uterus, cervix, fallopian tubes and ovaries sent to Pathology.  COMPLICATIONS:  None.  FINDINGS:  On entering the abdominal cavity, the patient was noted to have bilateral endometriomas, which were adherent to the pelvic sidewall and on the left also adherent to the patient's colon.  There was some endometriosis in the anterior cul-de-sac.  The posterior cul-de-sac was clean.  The uterus and fallopian tubes appeared to be normal.  DESCRIPTION OF PROCEDURE:  The patient was taken to the operating room, where she was placed in the dorsal supine position.  A general anesthetic was administered without difficulty.  She was then prepped and draped in usual fashion for this procedure.  A Foley catheter was placed.  SCDs had been previously placed.  A Pfannenstiel incision was made, this was carried down to the peritoneum on entering the peritoneum, the patient was placed in Trendelenburg.  An Rayburn Go- O'Sullivan retractor was then placed and the bowel packed away with three wet laps.  Examination of the pelvis was undertaken and the findings as noted above.  At this point, the right ovary was freed from the pelvic sidewall with sharp and blunt dissection.  Following this, the left ovary was freed from the colon and then the  pelvic sidewall. Once the ovaries were freed, the rest of the procedure was begun.  The round ligament on the patient's right was isolated, clamped, cut and ligated with 0 Monocryl suture.  The broad ligament was then opened. The infundibulopelvic ligament was isolated, clamped, cut and ligated x2 with 0 Monocryl suture.  Following this, the uterus was skeletonized down to the uterine vessel.  The similar procedure was performed on the opposite side.  Following this, the bladder flap was taken down with sharp dissection.  The uterine vessels were bilaterally clamped, cut, and ligated with 0 Monocryl suture.  Cardinal ligaments were sterilely clamped, cut, and ligated with 0 Monocryl suture.  Once the level of the external os was reached, the vagina was crossclamped and the specimen removed.  The vaginal angles were closed with 0 Monocryl suture in a Heaney fashion.  The vaginal cuff was then closed using 0 Vicryl suture in a running, locking fashion.  Following this, irrigation was performed.  There was one small area of bleeding noted at the patient's right vaginal angle.  A figure-of-eight suture of 0 Vicryl suture was placed and the bleeding was stopped.  All the pedicles were checked and felt to be hemostatic, this concluded the procedure.  The Harmon Pier  was removed and the three packs removed.  The parietal peritoneum was closed using 2-0 Monocryl in a running fashion. The muscles were closed using 2-0 Monocryl suture in a running fashion. The fascia was closed using 0 Monocryl suture in a running fashion. Subcuticular tissue was irrigated and made hemostatic with the Bovie. The subcuticular tissue was then closed using interrupted 2-0 plain gut suture.  A 3-0 Vicryl suture was then used to close the skin in a subcuticular fashion.  Steri-Strips were then placed.  The patient was awoken and taken to the recovery room in stable condition.  Instrument and lap  counts were correct x3.          ______________________________ Freda Munro, M.D.     MA/MEDQ  D:  11/14/2015  T:  11/15/2015  Job:  [id number]

## 2015-11-15 NOTE — Progress Notes (Signed)
POD#1 Pt without complaints. States that she has had some vaginal spotting. Adequate pain control . Tolerating liquids.  VSSAF Hgb-10.9 IMP/ Stable Plan/ Will d/c foley/IVFs. Advance diet. Will shower.

## 2015-11-16 MED ORDER — IBUPROFEN 600 MG PO TABS: 600.0000 mg | ORAL_TABLET | Freq: Four times a day (QID) | ORAL | Status: DC | PRN

## 2015-11-16 MED ORDER — HYDROMORPHONE HCL 4 MG PO TABS
4.0000 mg | ORAL_TABLET | ORAL | Status: DC | PRN
Start: 2015-11-16 — End: 2016-02-27

## 2015-11-16 NOTE — Progress Notes (Signed)
POD#2 Pt without complaints. Tolerating diet. No flatus yet + BS. Incision healing well. Adequate pain control. No hot flashes VSSAF IMP/ Doing well Plan/ Will discharge to home.

## 2015-11-16 NOTE — Progress Notes (Signed)
Pt ambulated out  Teaching complete 

## 2015-11-16 NOTE — Discharge Summary (Signed)
  Pt is a 35 y/o white female who was admitted for a TAH possible BSO for chronic pelvic pain and dysparunia felt to be secondary to endometriosis and adhesions. She had a Dx scope in March 2017 where an endometrioma was removed and dense adhesions were seen. She was offered Lupron/Robotic hyst and declined.  Pt was admitted and underwent a TAHBSO. . Bilateral endometriomas were seen . Please read op note. Path is pending Hosp course: She did well post op. At time of discharge her HGB was stable , she had nl VSs, she was ambulating, tolerating a diet and had adequate pain control. She was discharged to home. She will follow up in two weeks.

## 2016-01-16 ENCOUNTER — Other Ambulatory Visit: Payer: Self-pay | Admitting: Obstetrics and Gynecology

## 2016-02-27 ENCOUNTER — Ambulatory Visit (INDEPENDENT_AMBULATORY_CARE_PROVIDER_SITE_OTHER): Payer: Self-pay | Admitting: Family Medicine

## 2016-02-27 ENCOUNTER — Encounter: Payer: Self-pay | Admitting: Family Medicine

## 2016-02-27 VITALS — BP 128/86 | Ht 72.0 in | Wt 283.0 lb

## 2016-02-27 DIAGNOSIS — F322 Major depressive disorder, single episode, severe without psychotic features: Secondary | ICD-10-CM

## 2016-02-27 DIAGNOSIS — G43009 Migraine without aura, not intractable, without status migrainosus: Secondary | ICD-10-CM

## 2016-02-27 DIAGNOSIS — R202 Paresthesia of skin: Secondary | ICD-10-CM

## 2016-02-27 MED ORDER — TOPIRAMATE 100 MG PO TABS: 100.0000 mg | ORAL_TABLET | Freq: Two times a day (BID) | ORAL | 3 refills | Status: DC

## 2016-02-27 MED ORDER — CYCLOBENZAPRINE HCL 10 MG PO TABS: 10.0000 mg | ORAL_TABLET | Freq: Every evening | ORAL | 4 refills | Status: DC | PRN

## 2016-02-27 MED ORDER — OXYCODONE-ACETAMINOPHEN 5-325 MG PO TABS: 1.0000 | ORAL_TABLET | ORAL | 0 refills | Status: DC | PRN

## 2016-02-27 MED ORDER — VENLAFAXINE HCL 75 MG PO TABS: 75.0000 mg | ORAL_TABLET | Freq: Three times a day (TID) | ORAL | 3 refills | Status: DC

## 2016-02-27 NOTE — Progress Notes (Signed)
   Subjective:    Patient ID: Andrea [name], female    DOB: 04-28-1981, 35 y.o.   MRN: [id number]  HPITingling in hands and feet for the past couple of weeks.   Depression. Crying a lot. celexa not helping. We talked at length about medication choices in the past she did well with Effexor we will try Effexor and stopped Celexa  Had hysterectomy in the spring. Since having a hysterectomy due to endometriosis she is had a severe trouble with being depressed about the fact she can't have kids at times she thought about hurting herself but she does not have a specific plan plus also she loves her boyfriend and her family too much to hurt herself she promises that if she starts having any suicidal ideation issues she will be seen immediately here or ER  Pt just moved back from [place] and needs to get refill.  Was getting flexeril for muscle spasms in back and perococet for migraines. She gets severe headaches occasionally Topamax seems to keep it under control  Needs refill on topamax. She does relate tingling in her hands and feet this been going on for the past few weeks she wasn't sure what causes the symptoms. Could be stress could be the medication at the high dose.  Review of Systems     Objective:   Physical Exam Lungs clear hearts regular eardrums normal throat normal also normal BP good       Assessment & Plan:  Major depression referral for counseling start Effexor immediate release 75 mg one 3 times a dayrecheck patient in approximately 4 weeks  Patient states she's not suicidal she understands that if she becomes suicidal immediately go to ER or be seen by Dr. Immediately  Oxycodone for severe migraines limited number given not for long-term use  Numbness in the hands and feet probably related to the high dose of Topamax reduce it to 100 mg twice a day  Follow-up 4 weeks

## 2016-02-27 NOTE — Addendum Note (Signed)
Addended by: Carmelina Noun on: 02/27/2016 04:46 PM   Modules accepted: Orders

## 2016-03-01 ENCOUNTER — Telehealth: Payer: Self-pay | Admitting: Family Medicine

## 2016-03-01 NOTE — Telephone Encounter (Signed)
Dr. Zollie Beckers would like to speak to Dr. Nicki Reaper today concerning pt  [phone]

## 2016-03-03 NOTE — Telephone Encounter (Signed)
I discussed the case with Dr. Ronne Binning. They're treating her for possible stroke versus conversion. She will follow-up here after being discharged.

## 2016-03-05 ENCOUNTER — Telehealth: Payer: Self-pay | Admitting: Family Medicine

## 2016-03-05 NOTE — Telephone Encounter (Signed)
(  Nurse) patient called requesting refills on medication where she was in the hospital 9/15 but couldn't understand what she wanted she had hard time because of stroke. Wanted refill a muscle relaxer and Avan .She couldn't tell me the mg. Call into [facility]  Patient records have came in from [facility] for doctor to review before appointment on 9/22. In your basket in office.

## 2016-03-05 NOTE — Telephone Encounter (Signed)
Muscle relaxer and nerve pills is a bad combination . We cant do both, please find out from her pharmacy dose of med ,how many she got, when she got them Pt will also need follow up within 10 days

## 2016-03-05 NOTE — Telephone Encounter (Signed)
Spoke with patient and patient was seen at [facility] for possible stroke. Patient stated that she was prescribed Ativan and Robaxin at ER.

## 2016-03-06 ENCOUNTER — Encounter: Payer: Self-pay | Admitting: Family Medicine

## 2016-03-06 NOTE — Telephone Encounter (Signed)
Patient has appointment on Friday I recommend no prescriptions at this point with see her review over what went on

## 2016-03-06 NOTE — Telephone Encounter (Signed)
Spoke with patient and informed her per Dr.Scott Luking- Dr.Scott  recommends no prescriptions at this point with see her review over what went on. Patient verbalized understanding.

## 2016-03-06 NOTE — Telephone Encounter (Signed)
Spoke with patient and patient never had Ativan or Robaxin prescribed. Patient was given the two medication while in the hospital.

## 2016-03-06 NOTE — Telephone Encounter (Signed)
Left message return call (need to know what pharmacy patient had medication prescribed by [facility] filled at) 03/06/16

## 2016-03-08 ENCOUNTER — Encounter: Payer: Self-pay | Admitting: Family Medicine

## 2016-03-08 ENCOUNTER — Ambulatory Visit (INDEPENDENT_AMBULATORY_CARE_PROVIDER_SITE_OTHER): Payer: Self-pay | Admitting: Family Medicine

## 2016-03-08 VITALS — BP 110/88 | Ht 72.0 in | Wt 287.0 lb

## 2016-03-08 DIAGNOSIS — G8190 Hemiplegia, unspecified affecting unspecified side: Secondary | ICD-10-CM

## 2016-03-08 DIAGNOSIS — F418 Other specified anxiety disorders: Secondary | ICD-10-CM

## 2016-03-08 MED ORDER — ALPRAZOLAM 0.25 MG PO TABS: 0.2500 mg | ORAL_TABLET | Freq: Two times a day (BID) | ORAL | 2 refills | Status: DC | PRN

## 2016-03-08 MED ORDER — OXYCODONE-ACETAMINOPHEN 5-325 MG PO TABS: 1.0000 | ORAL_TABLET | ORAL | 0 refills | Status: DC | PRN

## 2016-03-08 NOTE — Progress Notes (Signed)
   Subjective:    Patient ID: Andrea [name], female    DOB: May 20, 1981, 35 y.o.   MRN: [id number]  HPI Patient is here today for a hospital follow up visit. Patient was admitted on last Thursday to [facility] for stroke-like symptoms. Right sided weakness/numbness and slurred speech still noted.  Patient has choppy speech-very staccato and deliberate in the delivery. In addition to this patient is complaining of intermittent headaches. She was being treated with medication for depression when this had onset. This patient is having right-sided weakness. She was seen at a local hospital had a CAT scan was suspected to have a stroke was given"clot busting drug"according to the patient. The records show that she was treated she also had MRI which did not show a stroke it was felt that this could be a conversion reaction.  The patient before hospitalization admitted to be under a significant amount of stress with depression related to the fact she had a hysterectomy within recent time which would make it impossible for her to have children She is here today with her boyfriend-Andrea [name] Patient has headaches and ear pain also.   Review of Systems Patient relates intermittent sharp headaches denies nausea vomiting double vision patient relates difficulty speaking weakness in the right arm weakness in the right leg no difficulty breathing no wheezing cough vomiting diarrhea    Objective:   Physical Exam HEENT benign speech pattern noted as above Neck no masses lungs are clear Heart regular no murmurs Left side strong right leg patient is able to walk on it but she has a shortened gait Right hand has very little voluntary movement When the patient was laid back and I placed my hands underneath her heels when she lifted the left side I could feel pressure underneath her right ankle when she was asked to raise the right side I did not feel pressure on either hand       Assessment & Plan:    Hemiplegia-this is a very complex patient her MRI was negative. I doubt a stroke but we need to approach this from a team perspective. I informed the patient that we will be having her see neurology for further evaluation. We will also have her see behavioral health for counseling. It is quite possible this is a conversion reaction. We do need to rule out rare neurologic conditions -the patient is disabled currently I hope she will qualify for temporary disability as well as emergency Medicaid. Possibly has this illness improves she will be able to get back to work. The patient and her boyfriend were told that this could be a physical manifestation of internal stress. Work excuse given for 30 days she is to follow-up in 30 days

## 2016-03-11 ENCOUNTER — Encounter: Payer: Self-pay | Admitting: Family Medicine

## 2016-03-17 LAB — POCT GLUCOSE (2 HR PP)

## 2016-03-19 ENCOUNTER — Encounter: Payer: Self-pay | Admitting: Family Medicine

## 2016-03-19 ENCOUNTER — Ambulatory Visit (INDEPENDENT_AMBULATORY_CARE_PROVIDER_SITE_OTHER): Payer: Self-pay | Admitting: Family Medicine

## 2016-03-19 VITALS — BP 122/84 | Ht 72.0 in | Wt 290.0 lb

## 2016-03-19 DIAGNOSIS — G8101 Flaccid hemiplegia affecting right dominant side: Secondary | ICD-10-CM

## 2016-03-19 MED ORDER — TRAMADOL HCL 50 MG PO TABS: 50.0000 mg | ORAL_TABLET | Freq: Three times a day (TID) | ORAL | 0 refills | Status: DC | PRN

## 2016-03-19 MED ORDER — CHLORZOXAZONE 500 MG PO TABS: 500.0000 mg | ORAL_TABLET | Freq: Four times a day (QID) | ORAL | 0 refills | Status: DC | PRN

## 2016-03-19 NOTE — Progress Notes (Signed)
   Subjective:    Patient ID: Andrea [name], female    DOB: May 17, 1981, 35 y.o.   MRN: [id number]  HPI Patient arrives for an ER follow up. Patient had a seizure like panic attack with jerking and became non responsive. No scan or testing done thru ER This patient was recently in the ER. They have evaluated her for the possibility of a seizure they felt it was more likely stress related and not a sign of seizures. She has not had an appointment yet with neurology or with psychiatry. [name] spoke with the family today they will use the information given to them to set up appointment with psychiatry. Patient prefers to go to [facility] for neurology in order to save on cost  Review of Systems Still with right arm weakness and difficulty speaking also relates mid back pain    Objective:   Physical Exam Lungs clear heart regular mid back subjective discomfort right arm weakness noted difficulty with speaking noted  The patient is applying for Medicaid. We will connect with [facility] neurology to try to help set up appointment.     Assessment & Plan:  Patient has had significant testing including MRI that does not point toward a stroke her symptoms certainly point toward a stroke but could also be a conversion reaction I believe that this will be best sorted out by having her see psychiatry as well as neurology. Patient unable to work currently. It is doubtful she'll be able to go back to work in the next several months because of the symptoms follow-up with [place] within a couple months.

## 2016-03-20 ENCOUNTER — Encounter: Payer: Self-pay | Admitting: Family Medicine

## 2016-03-26 ENCOUNTER — Ambulatory Visit: Payer: Self-pay | Admitting: Family Medicine

## 2016-04-01 DIAGNOSIS — Z0289 Encounter for other administrative examinations: Secondary | ICD-10-CM

## 2016-04-05 ENCOUNTER — Other Ambulatory Visit: Payer: Self-pay | Admitting: Family Medicine

## 2016-04-05 NOTE — Telephone Encounter (Signed)
3 refills on each

## 2016-04-15 ENCOUNTER — Ambulatory Visit (INDEPENDENT_AMBULATORY_CARE_PROVIDER_SITE_OTHER): Payer: Self-pay | Admitting: Family Medicine

## 2016-04-15 ENCOUNTER — Encounter: Payer: Self-pay | Admitting: Family Medicine

## 2016-04-15 VITALS — BP 128/84 | Temp 98.6°F | Ht 72.0 in | Wt 286.0 lb

## 2016-04-15 DIAGNOSIS — B9689 Other specified bacterial agents as the cause of diseases classified elsewhere: Secondary | ICD-10-CM

## 2016-04-15 DIAGNOSIS — G8100 Flaccid hemiplegia affecting unspecified side: Secondary | ICD-10-CM

## 2016-04-15 DIAGNOSIS — I6789 Other cerebrovascular disease: Secondary | ICD-10-CM

## 2016-04-15 DIAGNOSIS — J019 Acute sinusitis, unspecified: Secondary | ICD-10-CM

## 2016-04-15 MED ORDER — AMOXICILLIN 500 MG PO TABS: 500.0000 mg | ORAL_TABLET | Freq: Three times a day (TID) | ORAL | 0 refills | Status: DC

## 2016-04-15 MED ORDER — OXYCODONE-ACETAMINOPHEN 5-325 MG PO TABS: 1.0000 | ORAL_TABLET | Freq: Three times a day (TID) | ORAL | 0 refills | Status: DC | PRN

## 2016-04-15 NOTE — Progress Notes (Signed)
   Subjective:    Patient ID: Andrea [name], female    DOB: 16-Mar-1981, 35 y.o.   MRN: [id number]  Cough  This is a new problem. The current episode started in the past 7 days. Associated symptoms include ear pain, nasal congestion and a sore throat.   Patient with a proximally 1 week of head congestion drainage cough sinus pressure not feeling good denies high fever chills or sweats   Review of Systems  HENT: Positive for ear pain and sore throat.   Respiratory: Positive for cough.        Objective:   Physical Exam  Lungs are clear mild sinus tenderness eardrums normal throat normal no rest or distress  Patient has decreased motor use of the right arm along with some speech issues. It was felt that it was possible this could've been a conversion reaction but is also possible there could be strange neurologic issue going on that is not being detected by standard testing referral to [facility] neurology. Should be noted the patient was admitted into the hospital [facility] for what appeared to be a possible stroke versus conversion reaction      Assessment & Plan:

## 2016-04-23 ENCOUNTER — Encounter: Payer: Self-pay | Admitting: Family Medicine

## 2016-04-24 ENCOUNTER — Encounter (HOSPITAL_COMMUNITY): Payer: Self-pay | Admitting: Emergency Medicine

## 2016-04-24 ENCOUNTER — Emergency Department (HOSPITAL_COMMUNITY)
Admission: EM | Admit: 2016-04-24 | Discharge: 2016-04-24 | Disposition: A | Discharge status: Home / Self Care | Payer: Self-pay | Attending: Emergency Medicine | Admitting: Emergency Medicine

## 2016-04-24 ENCOUNTER — Emergency Department (HOSPITAL_COMMUNITY): Payer: Self-pay

## 2016-04-24 DIAGNOSIS — R109 Unspecified abdominal pain: Secondary | ICD-10-CM | POA: Insufficient documentation

## 2016-04-24 HISTORY — DX: Low back pain: M54.5

## 2016-04-24 HISTORY — DX: Low back pain, unspecified: M54.50

## 2016-04-24 LAB — BASIC METABOLIC PANEL
Anion gap: 6 (ref 5–15)
BUN: 13 mg/dL (ref 6–20)
CHLORIDE: 108 mmol/L (ref 101–111)
CO2: 25 mmol/L (ref 22–32)
CREATININE: 0.64 mg/dL (ref 0.44–1.00)
Calcium: 9.1 mg/dL (ref 8.9–10.3)
GFR calc non Af Amer: >60 mL/min (ref >60)
Glucose, Bld: 88 mg/dL (ref 65–99)
Potassium: 3.5 mmol/L (ref 3.5–5.1)
Sodium: 139 mmol/L (ref 135–145)

## 2016-04-24 LAB — CBC
HEMATOCRIT: 39.1 % (ref 36.0–46.0)
HEMOGLOBIN: 12.6 g/dL (ref 12.0–15.0)
MCH: 29 pg (ref 26.0–34.0)
MCHC: 32.2 g/dL (ref 30.0–36.0)
MCV: 90.1 fL (ref 78.0–100.0)
Platelets: 230 10*3/uL (ref 150–400)
RBC: 4.34 MIL/uL (ref 3.87–5.11)
RDW: 13.2 % (ref 11.5–15.5)
WBC: 7.1 10*3/uL (ref 4.0–10.5)

## 2016-04-24 LAB — URINALYSIS, ROUTINE W REFLEX MICROSCOPIC
Bilirubin Urine: NEGATIVE
GLUCOSE, UA: NEGATIVE mg/dL
Hgb urine dipstick: NEGATIVE
Ketones, ur: NEGATIVE mg/dL
LEUKOCYTES UA: NEGATIVE
Nitrite: NEGATIVE
PH: 6 (ref 5.0–8.0)
PROTEIN: NEGATIVE mg/dL
Specific Gravity, Urine: 1.015 (ref 1.005–1.030)

## 2016-04-24 MED ORDER — ONDANSETRON 8 MG PO TBDP
8.0000 mg | ORAL_TABLET | Freq: Once | ORAL | Status: AC
  Administered 2016-04-24: 8 mg via ORAL
  Filled 2016-04-24: qty 1

## 2016-04-24 MED ORDER — OXYCODONE-ACETAMINOPHEN 5-325 MG PO TABS
2.0000 | ORAL_TABLET | Freq: Once | ORAL | Status: AC
  Administered 2016-04-24: 2 via ORAL
  Filled 2016-04-24: qty 2

## 2016-04-24 MED ORDER — METHOCARBAMOL 500 MG PO TABS: 1000.0000 mg | ORAL_TABLET | Freq: Four times a day (QID) | ORAL | 0 refills | Status: DC | PRN

## 2016-04-24 MED ORDER — NAPROXEN 250 MG PO TABS: 250.0000 mg | ORAL_TABLET | Freq: Two times a day (BID) | ORAL | 0 refills | Status: DC | PRN

## 2016-04-24 NOTE — Discharge Instructions (Signed)
Take the prescriptions as directed.  Apply moist heat or ice to the area(s) of discomfort, for 15 minutes at a time, several times per day for the next few days.  Do not fall asleep on a heating or ice pack.  Call your regular medical doctor tomorrow to schedule a follow up appointment this week.  Return to the Emergency Department immediately if worsening. ° °

## 2016-04-24 NOTE — ED Triage Notes (Signed)
Pt reports right flank pain, nausea, urinary frequency with minimal urine output. Pt reports dysuria and pain radiation to RLQ.

## 2016-04-24 NOTE — ED Provider Notes (Signed)
[facility] DEPT Provider Note   CSN: [id number] Arrival date & time: 04/24/16  1119     History   Chief Complaint Chief Complaint  Patient presents with  . Flank Pain    HPI Andrea [name] is a 35 y.o. female.  HPI  Pt was seen at 1510.  Per pt, c/o sudden onset and persistence of waxing and waning right sided flank "pain" that began 2 days ago.  Pt describes the pain as "like my last kidney stone," and radiating into the right side of her abd.  Has been associated with nausea.  Denies vaginal bleeding/discharge, no dysuria/hematuria, no abd pain, no vomiting/diarrhea, no black or blood in emesis, no CP/SOB, no fevers, no rash.    Past Medical History:  Diagnosis Date  . Anxiety   . Complication of anesthesia    hard time waking up   . Depression   . GERD (gastroesophageal reflux disease)    no meds  . Kidney stones   . Low back pain   . Migraines   . PCOS (polycystic ovarian syndrome)   . PONV (postoperative nausea and vomiting)   . Shortness of breath dyspnea    with bronchitis    Patient Active Problem List   Diagnosis Date Noted  . Pelvic pain in female 11/14/2015  . Acute pyelonephritis 01/16/2013  . Migraines 10/30/2012  . Morbid obesity ([place]) 10/30/2012  . Depression with anxiety 10/30/2012  . Kidney stones     Past Surgical History:  Procedure Laterality Date  . ABDOMINAL HYSTERECTOMY N/A 11/14/2015   Procedure: HYSTERECTOMY ABDOMINAL;  Surgeon: Olga Millers, MD;  Location: [facility];  Service: Gynecology;  Laterality: N/A;  . CHOLECYSTECTOMY    . INTRAUTERINE DEVICE (IUD) INSERTION  06/14/2014   [facility]  . LYMPH NODES REMOVED    . ovarian cyst removed    . SALPINGOOPHORECTOMY Bilateral 11/14/2015   Procedure: SALPINGO OOPHORECTOMY;  Surgeon: Olga Millers, MD;  Location: [facility];  Service: Gynecology;  Laterality: Bilateral;    OB History    No data available       Home Medications    Prior to Admission medications     Medication Sig Start Date End Date Taking? Authorizing Provider  albuterol (PROVENTIL HFA;VENTOLIN HFA) 108 (90 BASE) MCG/ACT inhaler Inhale 2 puffs into the lungs every 6 (six) hours as needed for wheezing. 10/25/13   Kathyrn Drown, MD  ALPRAZolam Duanne Moron) 0.25 MG tablet Take 1 tablet (0.25 mg total) by mouth 2 (two) times daily as needed for anxiety. 03/08/16   Kathyrn Drown, MD  amoxicillin (AMOXIL) 500 MG tablet Take 1 tablet (500 mg total) by mouth 3 (three) times daily. 04/15/16   Kathyrn Drown, MD  chlorzoxazone (PARAFON) 500 MG tablet TAKE ONE TABLET BY MOUTH 4 TIMES DAILY AS NEEDED FOR MUSCLE SPASM 04/05/16   Kathyrn Drown, MD  fexofenadine (ALLEGRA) 180 MG tablet Take 180 mg by mouth daily as needed for allergies or rhinitis.    Historical Provider, MD  ibuprofen (ADVIL,MOTRIN) 600 MG tablet Take 1 tablet (600 mg total) by mouth every 6 (six) hours as needed (pain). 11/16/15   Olga Millers, MD  oxyCODONE-acetaminophen (ROXICET) 5-325 MG tablet Take 1 tablet by mouth every 8 (eight) hours as needed for severe pain. 04/15/16   Kathyrn Drown, MD  topiramate (TOPAMAX) 100 MG tablet Take 1 tablet (100 mg total) by mouth 2 (two) times daily. 02/27/16   Kathyrn Drown, MD  traMADol (ULTRAM) 50 MG tablet TAKE ONE TABLET BY MOUTH EVERY 8 HOURS AS NEEDED 04/05/16   Kathyrn Drown, MD  venlafaxine (EFFEXOR) 75 MG tablet Take 1 tablet (75 mg total) by mouth 3 (three) times daily with meals. 02/27/16   Kathyrn Drown, MD    Family History History reviewed. No pertinent family history.  Social History Social History  Substance Use Topics  . Smoking status: Never Smoker  . Smokeless tobacco: Never Used  . Alcohol use No     Allergies   Bactrim [sulfamethoxazole-trimethoprim] and Vicodin [hydrocodone-acetaminophen]   Review of Systems Review of Systems ROS: Statement: All systems negative except as marked or noted in the HPI; Constitutional: Negative for fever and chills. ; ; Eyes:  Negative for eye pain, redness and discharge. ; ; ENMT: Negative for ear pain, hoarseness, nasal congestion, sinus pressure and sore throat. ; ; Cardiovascular: Negative for chest pain, palpitations, diaphoresis, dyspnea and peripheral edema. ; ; Respiratory: Negative for cough, wheezing and stridor. ; ; Gastrointestinal: Negative for nausea, vomiting, diarrhea, abdominal pain, blood in stool, hematemesis, jaundice and rectal bleeding. . ; ; Genitourinary: +flank pain. Negative for dysuria and hematuria. ; ; Musculoskeletal: Negative for back pain and neck pain. Negative for swelling and trauma.; ; GYN:  No pelvic pain, no vaginal bleeding, no vaginal discharge, no vulvar pain. ;; Skin: Negative for pruritus, rash, abrasions, blisters, bruising and skin lesion.; ; Neuro: Negative for headache, lightheadedness and neck stiffness. Negative for weakness, altered level of consciousness, altered mental status, extremity weakness, paresthesias, involuntary movement, seizure and syncope.       Physical Exam Updated Vital Signs BP 140/99 (BP Location: Right Arm)   Pulse 82   Temp 98.2 F (36.8 C) (Oral)   Resp 19   Ht 6' (1.829 m)   Wt 280 lb (127 kg)   LMP  (LMP Unknown)   SpO2 97%   BMI 37.97 kg/m   Physical Exam 1515: Physical examination:  Nursing notes reviewed; Vital signs and O2 SAT reviewed;  Constitutional: Well developed, Well nourished, Well hydrated, In no acute distress; Head:  Normocephalic, atraumatic; Eyes: EOMI, PERRL, No scleral icterus; ENMT: Mouth and pharynx normal, Mucous membranes moist; Neck: Supple, Full range of motion, No lymphadenopathy; Cardiovascular: Regular rate and rhythm, No gallop; Respiratory: Breath sounds clear & equal bilaterally, No wheezes.  Speaking full sentences with ease, Normal respiratory effort/excursion; Chest: Nontender, Movement normal; Abdomen: Soft, Nontender, Nondistended, Normal bowel sounds; Genitourinary: No CVA tenderness; Spine:  No midline CS,  TS, LS tenderness. +TTP right lumbar paraspinal muscles.;; Extremities: Pulses normal, No tenderness, No edema, No calf edema or asymmetry.; Neuro: AA&Ox3, Major CN grossly intact.  Speech clear. No gross focal motor or sensory deficits in extremities. Climbs on and off stretcher easily by herself. Gait steady.; Skin: Color normal, Warm, Dry.   ED Treatments / Results  Labs (all labs ordered are listed, but only abnormal results are displayed)   EKG  EKG Interpretation None       Radiology   Procedures Procedures (including critical care time)  Medications Ordered in ED Medications  ondansetron (ZOFRAN-ODT) disintegrating tablet 8 mg (8 mg Oral Given 04/24/16 1539)  oxyCODONE-acetaminophen (PERCOCET/ROXICET) 5-325 MG per tablet 2 tablet (2 tablets Oral Given 04/24/16 1540)     Initial Impression / Assessment and Plan / ED Course  I have reviewed the triage vital signs and the nursing notes.  Pertinent labs & imaging results that were available during my care of the  patient were reviewed by me and considered in my medical decision making (see chart for details).  MDM Reviewed: nursing note, vitals and previous chart Reviewed previous: labs Interpretation: labs and CT scan   Results for orders placed or performed during the hospital encounter of 04/24/16  Urinalysis, Routine w reflex microscopic- may I&O cath if menses  Result Value Ref Range   Color, Urine YELLOW YELLOW   APPearance CLEAR CLEAR   Specific Gravity, Urine 1.015 1.005 - 1.030   pH 6.0 5.0 - 8.0   Glucose, UA NEGATIVE NEGATIVE mg/dL   Hgb urine dipstick NEGATIVE NEGATIVE   Bilirubin Urine NEGATIVE NEGATIVE   Ketones, ur NEGATIVE NEGATIVE mg/dL   Protein, ur NEGATIVE NEGATIVE mg/dL   Nitrite NEGATIVE NEGATIVE   Leukocytes, UA NEGATIVE NEGATIVE  CBC  Result Value Ref Range   WBC 7.1 4.0 - 10.5 K/uL   RBC 4.34 3.87 - 5.11 MIL/uL   Hemoglobin 12.6 12.0 - 15.0 g/dL   HCT 39.1 36.0 - 46.0 %   MCV 90.1  78.0 - 100.0 fL   MCH 29.0 26.0 - 34.0 pg   MCHC 32.2 30.0 - 36.0 g/dL   RDW 13.2 11.5 - 15.5 %   Platelets 230 150 - 400 K/uL  Basic metabolic panel  Result Value Ref Range   Sodium 139 135 - 145 mmol/L   Potassium 3.5 3.5 - 5.1 mmol/L   Chloride 108 101 - 111 mmol/L   CO2 25 22 - 32 mmol/L   Glucose, Bld 88 65 - 99 mg/dL   BUN 13 6 - 20 mg/dL   Creatinine, Ser 0.64 0.44 - 1.00 mg/dL   Calcium 9.1 8.9 - 10.3 mg/dL   GFR calc non Af Amer >60 >60 mL/min   GFR calc Af Amer >60 >60 mL/min   Anion gap 6 5 - 15   Ct Renal Stone Study Result Date: 04/24/2016 CLINICAL DATA:  Right flank pain, nausea, urinary frequency EXAM: CT ABDOMEN AND PELVIS WITHOUT CONTRAST TECHNIQUE: Multidetector CT imaging of the abdomen and pelvis was performed following the standard protocol without IV contrast. COMPARISON:  04/02/2013 FINDINGS: Lower chest: 4 mm right lower lobe subpleural pulmonary nodule. Hepatobiliary: No focal liver abnormality is seen. Status post cholecystectomy. No biliary dilatation. Pancreas: Unremarkable. No pancreatic ductal dilatation or surrounding inflammatory changes. Spleen: Normal in size without focal abnormality. Adrenals/Urinary Tract: Normal adrenal glands. Small nonobstructing bilateral renal calculi. No obstructive uropathy. No renal mass. Normal bladder. Stomach/Bowel: Stomach is within normal limits. Appendix appears normal. No evidence of bowel wall thickening, distention, or inflammatory changes. Vascular/Lymphatic: No significant vascular findings are present. No enlarged abdominal or pelvic lymph nodes. Reproductive: Status post hysterectomy. No adnexal masses. Other: No abdominal wall hernia or abnormality. No abdominopelvic ascites. Musculoskeletal: No acute or significant osseous findings. Mild osteoarthritis of bilateral sacroiliac joints. No aggressive lytic or sclerotic osseous lesion. IMPRESSION: 1. Bilateral nonobstructing renal calculi. Electronically Signed   By: Kathreen Devoid   On: 04/24/2016 15:18    1645:  Workup reassuring. Tx symptomatically, f/u PMD. Dx and testing d/w pt and family.  Questions answered.  Verb understanding, agreeable to d/c home with outpt f/u.    Final Clinical Impressions(s) / ED Diagnoses   Final diagnoses:  Flank pain    New Prescriptions New Prescriptions   No medications on file     Francine Graven, DO 04/27/16 2337

## 2016-05-28 ENCOUNTER — Ambulatory Visit: Payer: Self-pay | Admitting: Family Medicine

## 2016-05-30 ENCOUNTER — Encounter: Payer: Self-pay | Admitting: Family Medicine

## 2016-05-30 ENCOUNTER — Other Ambulatory Visit: Payer: Self-pay | Admitting: Family Medicine

## 2016-05-30 NOTE — Telephone Encounter (Signed)
This patient is asking for changes it needs an office visit. Patient should be advised if she gets worse to go to the ER schedule her for next week

## 2016-06-04 ENCOUNTER — Encounter: Payer: Self-pay | Admitting: Family Medicine

## 2016-06-04 ENCOUNTER — Telehealth: Payer: Self-pay | Admitting: Family Medicine

## 2016-06-04 ENCOUNTER — Telehealth: Payer: Self-pay | Admitting: Pediatrics

## 2016-06-04 ENCOUNTER — Ambulatory Visit (INDEPENDENT_AMBULATORY_CARE_PROVIDER_SITE_OTHER): Payer: Self-pay | Admitting: Family Medicine

## 2016-06-04 VITALS — BP 124/86 | Ht 72.0 in | Wt 291.0 lb

## 2016-06-04 DIAGNOSIS — R569 Unspecified convulsions: Secondary | ICD-10-CM

## 2016-06-04 DIAGNOSIS — F418 Other specified anxiety disorders: Secondary | ICD-10-CM

## 2016-06-04 DIAGNOSIS — G43009 Migraine without aura, not intractable, without status migrainosus: Secondary | ICD-10-CM

## 2016-06-04 HISTORY — DX: Unspecified convulsions: R56.9

## 2016-06-04 MED ORDER — TIZANIDINE HCL 2 MG PO CAPS: 2.0000 mg | ORAL_CAPSULE | Freq: Three times a day (TID) | ORAL | 2 refills | Status: DC | PRN

## 2016-06-04 MED ORDER — NAPROXEN 375 MG PO TABS: 375.0000 mg | ORAL_TABLET | Freq: Two times a day (BID) | ORAL | 2 refills | Status: DC | PRN

## 2016-06-04 MED ORDER — VENLAFAXINE HCL ER 75 MG PO CP24: 75.0000 mg | ORAL_CAPSULE | Freq: Two times a day (BID) | ORAL | 2 refills | Status: DC

## 2016-06-04 NOTE — Telephone Encounter (Signed)
I would recommend that we give this until early January. Please talk with the patient. If she would prefer for you to go through [facility] system that would be fine. If not connect with [facility] in early January to see if they have made any headway if they have still not made headway then consider utilizing [facility]

## 2016-06-04 NOTE — Telephone Encounter (Signed)
Please advise  Referral was sent to [facility] 04/23/16, had to be sent to financial department for approval, called 05/22/16 to check status and its' still pending approval  Should we refer to neurology within the [facility] system so that the patient can apply for the [facility] financial assistance program?  Also referral to psychology will be to [employer] [facility].(could take a while to get her in)   The places that allow me to refer to are very limited and currently the patient has no coverage or qualification for the financial assistance program on file

## 2016-06-04 NOTE — Telephone Encounter (Signed)
I LMTCB for Otis Dials at [facility] Respiratory Dept.  EEG needs to be scheduled for this patient.

## 2016-06-04 NOTE — Progress Notes (Signed)
   Subjective:    Patient ID: Andrea [name], female    DOB: [birth year], 35 y.o.   MRN: [id number]  HPIFollow up on headaches and seizures. Getting worse.   Pt needs refill on effexor 75mg  ER. Takes one bid. Prescribed by specialist. Med list directions are different She relates the depression medicine is helping but she still finds himself frustrated about her condition  This patient has had chronic headaches ever since been in the hospital [place] at that time she was diagnosed with the possibility of a conversion style reaction. Her MRI pointed against a stroke but ever since been in the hospital she's had right arm and right leg weakness is been unable to work she is also had a very staccato speech and she's had onset of frequent headaches plus also onset of seizure-like activities  Her companion brings in a cell phone video of the patient having one of these spells. It's very hard to tell but it appears to look like a seizure but the post ictal phase is only 2 or 3 minutes which is shorter than what one would expect   Review of Systems Patient relates headaches she relates seizure like activity. She relates weakness frequent falls she relates depressed mood but not suicidal denies chest pain shortness of breath nausea vomiting diarrhea    Objective:   Physical Exam  Lungs are clear hearts regular she has spastic movements of the right arm. Weakness in the right arm. Her speech is staccato right ankle subjective discomfort no abnormality seen left ankle normal no swelling seen      Assessment & Plan:  Neurologic events-the husband showed me a video tape of the patient's seizure. Certainly she has complex movements that could be seizure related. It seems that the post ictal phase is short-I cannot with 100% ability state is this or is this not seizures. This patient will need to seen be [facility]. More than likely may end up needing event  monitoring/filming. It is possible this could be pseudoseizures but it is also possible it could be a neurologic issue. This patient is definitely not able to work currently. I have advised the patient not to drive. No swimming. It is best for her to use showers rather than baths.  Depression being treated with Effexor she states it's helping but she gets frustrated by her condition. I believe patient would benefit from psychiatry evaluation and counseling  Chronic intractable headaches. I told the patient that tramadol and Percocet are not appropriate for this type of headache. She may use Naprosyn when necessary. She can also use muscle relaxers when necessary when she is at home.  Patient was given work note through April 1  Patient was also given note to give the social services there is no way she would be able to do any work training or work with her current condition  Right ankle sprain should gradually get better over the next few weeks notify us if not  It will be difficult to get this patient help. She would be best served by a multidisciplinary team but patient does not have insurance. [facility] refused to see the patient stating they did not feel they could help her. I hope that she will be able to be seen by neurology at [facility].  EEG ordered locally

## 2016-06-13 ENCOUNTER — Ambulatory Visit: Payer: Self-pay | Admitting: Family Medicine

## 2016-06-17 ENCOUNTER — Other Ambulatory Visit: Payer: Self-pay | Admitting: Family Medicine

## 2016-06-18 NOTE — Telephone Encounter (Signed)
May refill each of these medicines plus four additional refills

## 2016-07-03 ENCOUNTER — Encounter: Payer: Self-pay | Admitting: Family Medicine

## 2016-07-05 MED ORDER — TIZANIDINE HCL 4 MG PO TABS: 4.0000 mg | ORAL_TABLET | Freq: Three times a day (TID) | ORAL | 2 refills | Status: DC | PRN

## 2016-07-05 NOTE — Addendum Note (Signed)
Addended by: Dorothyann Gibbs on: 07/05/2016 09:59 AM   Modules accepted: Orders

## 2016-07-05 NOTE — Telephone Encounter (Signed)
Nurse's-please inform patient and make the following changes-it would be fine to change her Zanaflex from 2 mg-new dose 4 mg one taken 3 times a day as needed for migraines #30 with 2 refills avoid frequent use. Also I have not heard from [facility] yet. Typically they will send a request for certain prescriptions to be written out and sent back to them which are we are more than willing to do

## 2016-07-08 ENCOUNTER — Telehealth: Payer: Self-pay | Admitting: Family Medicine

## 2016-07-08 MED ORDER — TIZANIDINE HCL 4 MG PO TABS: 4.0000 mg | ORAL_TABLET | Freq: Three times a day (TID) | ORAL | 2 refills | Status: DC | PRN

## 2016-07-08 NOTE — Telephone Encounter (Signed)
Left message on voicemail notifying patient med sent to [facility].

## 2016-07-08 NOTE — Telephone Encounter (Signed)
We sent in Rx on 07/05/16 for tizanidine.  This was sent to [facility], but she needs it to be sent to [facility].

## 2016-07-12 NOTE — Telephone Encounter (Signed)
Spoke with pt She's seeing Dr. Vicente Serene 08/09/16 @ 1:30 at [facility] Pt states she's also seeing [facility] for counseling

## 2016-07-12 NOTE — Telephone Encounter (Signed)
Pt states she has an appt with neurology Dr. Ronalee Red? Feb 23rd.

## 2016-07-14 NOTE — Telephone Encounter (Signed)
This is good. This patient does need further input from a specialist. If she is not already had EEG done the specialist can decide what where to take it from there.( I believe this patient will benefit from a teen approach at a [facility] neurology-behavioral health some of her symptoms are difficult to match up physiologically)

## 2016-07-15 NOTE — Telephone Encounter (Signed)
Notified patient this is good. This patient does need further input from a specialist. If she is not already had EEG done the specialist can decide what where to take it from there. Patient verbalized understanding.

## 2016-07-25 ENCOUNTER — Ambulatory Visit (INDEPENDENT_AMBULATORY_CARE_PROVIDER_SITE_OTHER): Payer: Self-pay | Admitting: Family Medicine

## 2016-07-25 ENCOUNTER — Encounter: Payer: Self-pay | Admitting: Family Medicine

## 2016-07-25 VITALS — BP 118/80 | Ht 72.0 in | Wt 297.0 lb

## 2016-07-25 DIAGNOSIS — R569 Unspecified convulsions: Secondary | ICD-10-CM

## 2016-07-25 DIAGNOSIS — J209 Acute bronchitis, unspecified: Secondary | ICD-10-CM

## 2016-07-25 MED ORDER — PREDNISONE 20 MG PO TABS: ORAL_TABLET | ORAL | 0 refills | Status: DC

## 2016-07-25 MED ORDER — AMOXICILLIN 500 MG PO TABS: 500.0000 mg | ORAL_TABLET | Freq: Three times a day (TID) | ORAL | 0 refills | Status: DC

## 2016-07-25 MED ORDER — NYSTATIN 100000 UNIT/GM EX CREA: 1.0000 "application " | TOPICAL_CREAM | Freq: Two times a day (BID) | CUTANEOUS | 3 refills | Status: DC

## 2016-07-25 NOTE — Progress Notes (Signed)
   Subjective:    Patient ID: Andrea [name], female    DOB: Oct 22, 1980, 36 y.o.   MRN: [id number]  HPI Patient is here today for a ER follow up visit. Patient was seen at [facility] ER for shortness of breath. Patient states that she is improving but still has periods where she coughs a lot.  Patient denies high fevers no vomiting or diarrhea. Moderate coughing. Denies shortness of breath. PMH benign. Patient has a rash on her abdomen at she wants to show the doctor today.  Review of Systems See above.    Objective:   Physical Exam Patient has interrupted talking style lungs clear heart regular cough noted patient also has persistent nonfunctioning of the right arm left arm normal extremities no edema Patient has a small amount of yeast rash in the lower abdomen      Assessment & Plan:  Rash on lower abdomen is 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 a yeast related rash nystatin ordered  Probable asthmatic bronchitis versus viral syndrome with bronchial irritation-antibiotic prescribed continue albuterol short prednisone taper no x-ray indicated  Patient with persistent issues regarding right arm movement as well as her speech attributed to the possibility of a stroke from her hospital stay at [facility] last year. Patient is seeing neurology coming up in several weeks to help determine the extent of the neurologic issue as well as reported seizures-patient may end up needing extensive testing. There was some element during her hospitalization that indicate the possibility of conversion reaction but she is been this way for several months running without much change I do believe consultation with neurology as a good idea for this patient  In the current condition that this patient is in there is no way that she can hold a job I do not see her being Wyvonnia Lora to work at all over the coming year. I would consider her disabled based on what I am noting of what I have seen over the past  several months

## 2016-08-01 ENCOUNTER — Telehealth: Payer: Self-pay | Admitting: Family Medicine

## 2016-08-01 NOTE — Telephone Encounter (Signed)
The patient is having a difficult time affording her medications. Please do a referral to Garnette Gunner at the [facility] thank you

## 2016-08-01 NOTE — Telephone Encounter (Signed)
The patient was requesting that the most recent information regarding her office visit here would be sent to Andrea [name] he [place] for disability. Please send a copy of her most recent office visit note to disability/oh security thank you

## 2016-08-02 NOTE — Telephone Encounter (Signed)
Not something I have ever done  Could you help with this?

## 2016-08-09 DIAGNOSIS — Z029 Encounter for administrative examinations, unspecified: Secondary | ICD-10-CM

## 2016-08-12 ENCOUNTER — Other Ambulatory Visit: Payer: Self-pay | Admitting: Family Medicine

## 2016-08-13 NOTE — Telephone Encounter (Signed)
Patient last seen on 07/25/16

## 2016-08-30 ENCOUNTER — Encounter: Payer: Self-pay | Admitting: Family Medicine

## 2016-08-30 ENCOUNTER — Other Ambulatory Visit: Payer: Self-pay | Admitting: Family Medicine

## 2016-08-30 NOTE — Telephone Encounter (Signed)
Last seen February 2018 

## 2016-09-02 ENCOUNTER — Ambulatory Visit: Payer: Self-pay | Admitting: Family Medicine

## 2016-09-12 ENCOUNTER — Encounter: Payer: Self-pay | Admitting: Family Medicine

## 2016-09-18 ENCOUNTER — Ambulatory Visit (INDEPENDENT_AMBULATORY_CARE_PROVIDER_SITE_OTHER): Payer: Self-pay | Admitting: Family Medicine

## 2016-09-18 VITALS — BP 130/80 | Ht 72.0 in | Wt 295.8 lb

## 2016-09-18 DIAGNOSIS — I10 Essential (primary) hypertension: Secondary | ICD-10-CM

## 2016-09-18 DIAGNOSIS — R569 Unspecified convulsions: Secondary | ICD-10-CM

## 2016-09-18 DIAGNOSIS — R739 Hyperglycemia, unspecified: Secondary | ICD-10-CM

## 2016-09-18 DIAGNOSIS — F418 Other specified anxiety disorders: Secondary | ICD-10-CM

## 2016-09-18 DIAGNOSIS — E1165 Type 2 diabetes mellitus with hyperglycemia: Secondary | ICD-10-CM | POA: Insufficient documentation

## 2016-09-18 LAB — POCT GLYCOSYLATED HEMOGLOBIN (HGB A1C): Hemoglobin A1C: 7.2

## 2016-09-18 MED ORDER — CLONAZEPAM 0.5 MG PO TABS: 0.5000 mg | ORAL_TABLET | Freq: Two times a day (BID) | ORAL | 0 refills | Status: DC | PRN

## 2016-09-18 MED ORDER — METFORMIN HCL 500 MG PO TABS: 500.0000 mg | ORAL_TABLET | Freq: Every day | ORAL | 3 refills | Status: DC

## 2016-09-18 MED ORDER — VENLAFAXINE HCL ER 37.5 MG PO CP24: 37.5000 mg | ORAL_CAPSULE | Freq: Every day | ORAL | 0 refills | Status: DC

## 2016-09-18 MED ORDER — FLUOXETINE HCL 20 MG PO TABS: 20.0000 mg | ORAL_TABLET | Freq: Every day | ORAL | 3 refills | Status: DC

## 2016-09-18 MED ORDER — LISINOPRIL 5 MG PO TABS: 5.0000 mg | ORAL_TABLET | Freq: Every day | ORAL | 3 refills | Status: DC

## 2016-09-18 MED ORDER — TRAZODONE HCL 50 MG PO TABS: 25.0000 mg | ORAL_TABLET | Freq: Every evening | ORAL | 3 refills | Status: DC | PRN

## 2016-09-18 MED ORDER — TIZANIDINE HCL 4 MG PO TABS: 4.0000 mg | ORAL_TABLET | Freq: Three times a day (TID) | ORAL | 2 refills | Status: DC | PRN

## 2016-09-18 NOTE — Addendum Note (Signed)
Addended by: Dairl Ponder on: 09/18/2016 01:37 PM   Modules accepted: Orders

## 2016-09-18 NOTE — Progress Notes (Signed)
   Subjective:    Patient ID: Andrea [name], female    DOB: [birth year], 36 y.o.   MRN: [id number]  HPI  Patient arrives to discuss recent vist with psychiatrist. Patient states she feels he was rude to her and she will not be returning. Patient is confused and upset with her current health state. Patient is very frustrated about her health she understands that she has some mental health issues she also understands that is causing physical issues as well she is disabled unable to work every she has a boyfriend who is helping her she went and saw psychiatrist who she states was rude to her and spent very little time she does not want to go back to see them but she understands that she would benefit from seeing a psychiatrist. 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 Review of Systems    please see above denies chest pain shortness breath sweats chills vomiting diarrhea. Objective:   Physical Exam Her speech is doing better but is still interrupted and somewhat difficult to understand her right arm still has weakness lungs are clear hearts regular pulse normal blood pressure is elevated A1c is elevated at 7.2       Assessment & Plan:  Obesity patient was encouraged watch diet excise try to bring weight down Blood pressure moderately elevated start lisinopril recheck in 3 weeks 5 mg daily Diabetes new onset watch diet exercise try to bring weight down start metformin 500 mg daily Depression with anxiety-patient denies being suicidal homicidal she does recognize the need to see a psychiatrist we will try to get her in with someone different in addition to this we will transition her off of the Effexor and on to Prozac I feel this is doing better job with her depression and possibly help with some of her panic attacks she will use Klonopin only when necessary and she will use trazodone at nighttime to help with sleep once again she denies being  suicidal and agrees to follow-up in 3 weeks

## 2016-09-18 NOTE — Progress Notes (Signed)
Referral ordered in EPIC. 

## 2016-09-28 ENCOUNTER — Other Ambulatory Visit: Payer: Self-pay | Admitting: Family Medicine

## 2016-09-30 ENCOUNTER — Encounter: Payer: Self-pay | Admitting: Family Medicine

## 2016-09-30 ENCOUNTER — Telehealth: Payer: Self-pay | Admitting: Family Medicine

## 2016-09-30 NOTE — Telephone Encounter (Signed)
The patient sent a my chart message regarding swelling in the feet. Please have discussion with the patient if she is having any breathing difficulties or excessive swelling would be best for her to be seen otherwise week and prescribed a diuretic that she could use on a when necessary basis. Lasix 20 mg 1 every morning when necessary swelling, #14, 1 refill if having to use this on a frequent basis or if her condition is worsening please make sure the patient knows to follow-up for an office visit

## 2016-09-30 NOTE — Telephone Encounter (Signed)
Spoke with patient and she stated that the swelling is frequently. She is not having any difficulties breathing. Informed patient since swelling in frequent office visit is necessary. Patient verbalized understanding and stated that she will schedule for office visit.

## 2016-09-30 NOTE — Telephone Encounter (Signed)
Left message return call 09/30/2016

## 2016-10-01 ENCOUNTER — Encounter: Payer: Self-pay | Admitting: Family Medicine

## 2016-10-08 ENCOUNTER — Other Ambulatory Visit: Payer: Self-pay | Admitting: *Deleted

## 2016-10-08 MED ORDER — VENLAFAXINE HCL ER 37.5 MG PO CP24: 37.5000 mg | ORAL_CAPSULE | Freq: Every day | ORAL | 5 refills | Status: DC

## 2016-10-10 ENCOUNTER — Encounter: Payer: Self-pay | Admitting: Family Medicine

## 2016-10-10 ENCOUNTER — Ambulatory Visit (INDEPENDENT_AMBULATORY_CARE_PROVIDER_SITE_OTHER): Payer: Self-pay | Admitting: Family Medicine

## 2016-10-10 VITALS — BP 112/80 | Ht 72.0 in | Wt 291.2 lb

## 2016-10-10 DIAGNOSIS — R440 Auditory hallucinations: Secondary | ICD-10-CM

## 2016-10-10 DIAGNOSIS — E1165 Type 2 diabetes mellitus with hyperglycemia: Secondary | ICD-10-CM

## 2016-10-10 DIAGNOSIS — I10 Essential (primary) hypertension: Secondary | ICD-10-CM

## 2016-10-10 DIAGNOSIS — F324 Major depressive disorder, single episode, in partial remission: Secondary | ICD-10-CM

## 2016-10-10 MED ORDER — TRAZODONE HCL 100 MG PO TABS: 100.0000 mg | ORAL_TABLET | Freq: Every evening | ORAL | 4 refills | Status: DC | PRN

## 2016-10-10 NOTE — Progress Notes (Signed)
   Subjective:    Patient ID: Andrea [name], female    DOB: 21-Feb-1981, 36 y.o.   MRN: [id number]  HPI Patient is here today for a follow up visit on depression.  Patient was admitted in [facility] in [place] for 7 days for overdose. Patient states that she is doing a lot better.  Patient is having pain in her lower back and feet. Patient states that her anxiety is pretty bad right now also.  Review of Systems  Constitutional: Negative for activity change, fatigue and fever.  Respiratory: Negative for cough and shortness of breath.   Cardiovascular: Negative for chest pain and leg swelling.  Neurological: Negative for headaches.       Objective:   Physical Exam  Constitutional: She appears well-nourished. No distress.  HENT:  Head: Normocephalic.  Cardiovascular: Normal rate, regular rhythm and normal heart sounds.   No murmur heard. Pulmonary/Chest: Effort normal and breath sounds normal.  Musculoskeletal: She exhibits no edema.  Lymphadenopathy:    She has no cervical adenopathy.  Neurological: She is alert.  Psychiatric: Her behavior is normal.  Vitals reviewed.        Assessment & Plan:  Possible developing neuropathy in the feet does not appear to be severe. Try just general measures currently if progressive will need referral to neurology for nerve conduction study  Patient is remarkably doing better with neurologic symptoms. She is able to move her right side and able to speak much better  Still under a lot of stress Recent depression with suicidal attempt patient states she is not suicidal currently. She is going to [facility] in the coming weeks they will manage her medicines and do counseling For now we will manage her medicines till they take over She already has nerve medicine at home her boyfriend is dispensing medicine on a daily basis Patient with hearing voices indicates the possibility of a schizoaffective disorder  We will see  her back in 6 weeks This patient is still disabled because of her mental health illnesses   Given her symptomatology with psychiatric illness I believe it is in her best interest to have [facility] manage her psychiatric medicines

## 2016-10-14 ENCOUNTER — Ambulatory Visit (INDEPENDENT_AMBULATORY_CARE_PROVIDER_SITE_OTHER): Payer: Self-pay | Admitting: Family Medicine

## 2016-10-14 ENCOUNTER — Encounter: Payer: Self-pay | Admitting: Family Medicine

## 2016-10-14 VITALS — Temp 98.3°F | Ht 72.0 in | Wt 289.0 lb

## 2016-10-14 DIAGNOSIS — N23 Unspecified renal colic: Secondary | ICD-10-CM

## 2016-10-14 DIAGNOSIS — R300 Dysuria: Secondary | ICD-10-CM

## 2016-10-14 LAB — POCT URINALYSIS DIPSTICK
Spec Grav, UA: <=1.005 — AB (ref 1.010–1.025)
pH, UA: 7 (ref 5.0–8.0)

## 2016-10-14 MED ORDER — PROMETHAZINE HCL 25 MG PO TABS: ORAL_TABLET | ORAL | 0 refills | Status: DC

## 2016-10-14 MED ORDER — TAMSULOSIN HCL 0.4 MG PO CAPS: 0.4000 mg | ORAL_CAPSULE | Freq: Every day | ORAL | 1 refills | Status: DC

## 2016-10-14 MED ORDER — OXYCODONE-ACETAMINOPHEN 10-325 MG PO TABS
1.0000 | ORAL_TABLET | ORAL | 0 refills | Status: DC | PRN
Start: 2016-10-14 — End: 2016-12-24

## 2016-10-14 NOTE — Progress Notes (Signed)
   Subjective:    Patient ID: LAURIANN MILILLO, female    DOB: 1980-10-28, 36 y.o.   MRN: [id number]  HPIFollow up ED visit for Kidney stone. Went to [place] on Friday. Not any better. Prescribed flomax, diflonac, percocet.  Having painful urination, abdominal pain, fever, and vomiting.  Patient went to the ER up in [facility] diagnosed with kidney stones she was put on additional medication. She states still having significant pain discomfort denies high fever chills sweats PMH benign  Review of Systems Please see above no vomiting or diarrhea. No current fever    Objective:   Physical Exam Lungs clear hearts regular abdomen soft       Assessment & Plan:  Kidney stone Flomax daily, plenty of fluids, Percocet stronger medication boyfriend will give her the medicine when necessary-patient with recent admission for a psychiatric issue and attempted overdose therefore we had opened discussion with her and her boyfriend that this medicine needs to be controlled by him if ongoing troubles will set her up with urology she will give [place] an update later this week

## 2016-10-18 ENCOUNTER — Ambulatory Visit: Payer: Self-pay | Admitting: Family Medicine

## 2016-10-30 ENCOUNTER — Encounter: Payer: Self-pay | Admitting: Family Medicine

## 2016-10-31 ENCOUNTER — Telehealth: Payer: Self-pay | Admitting: Family Medicine

## 2016-10-31 MED ORDER — ARIPIPRAZOLE 2 MG PO TABS: 2.0000 mg | ORAL_TABLET | Freq: Every day | ORAL | 0 refills | Status: DC

## 2016-10-31 MED ORDER — HYDROCHLOROTHIAZIDE 25 MG PO TABS: 25.0000 mg | ORAL_TABLET | Freq: Every day | ORAL | 4 refills | Status: DC

## 2016-10-31 MED ORDER — LISINOPRIL 5 MG PO TABS: 5.0000 mg | ORAL_TABLET | Freq: Every day | ORAL | 4 refills | Status: DC

## 2016-10-31 MED ORDER — CLONAZEPAM 0.5 MG PO TABS: 0.5000 mg | ORAL_TABLET | Freq: Two times a day (BID) | ORAL | 0 refills | Status: DC | PRN

## 2016-10-31 MED ORDER — FLUOXETINE HCL 20 MG PO TABS: 20.0000 mg | ORAL_TABLET | Freq: Every day | ORAL | 0 refills | Status: DC

## 2016-10-31 MED ORDER — TOPIRAMATE 100 MG PO TABS: 100.0000 mg | ORAL_TABLET | Freq: Two times a day (BID) | ORAL | 4 refills | Status: DC

## 2016-10-31 NOTE — Addendum Note (Signed)
Addended by: Ofilia Neas R on: 10/31/2016 11:55 AM   Modules accepted: Orders

## 2016-10-31 NOTE — Telephone Encounter (Signed)
Patient called requesting that all Rs written today be transferred from [facility] to [facility]. I contacted [facility] and had the prescriptions transferred.

## 2016-10-31 NOTE — Telephone Encounter (Signed)
Please see previous note.

## 2016-10-31 NOTE — Telephone Encounter (Signed)
Nurse's-please fill her psychiatric medicines for one month. As for her chronic medicine she may have 4 refills on each, as for the metformin and advised her to cut it back to a half a tablet once a day she should be able to tolerate this with a snack. She should keep all regular follow-up visits.

## 2016-11-04 ENCOUNTER — Other Ambulatory Visit: Payer: Self-pay | Admitting: *Deleted

## 2016-11-04 ENCOUNTER — Telehealth: Payer: Self-pay | Admitting: Family Medicine

## 2016-11-04 DIAGNOSIS — I10 Essential (primary) hypertension: Secondary | ICD-10-CM

## 2016-11-04 MED ORDER — FLUOXETINE HCL 40 MG PO CAPS: 40.0000 mg | ORAL_CAPSULE | Freq: Every day | ORAL | 1 refills | Status: DC

## 2016-11-04 MED ORDER — CLONAZEPAM 0.5 MG PO TABS: 0.5000 mg | ORAL_TABLET | Freq: Two times a day (BID) | ORAL | 0 refills | Status: DC | PRN

## 2016-11-04 MED ORDER — LISINOPRIL 20 MG PO TABS: 20.0000 mg | ORAL_TABLET | Freq: Every day | ORAL | 5 refills | Status: DC

## 2016-11-04 NOTE — Telephone Encounter (Signed)
Left message to return call. I did verify dosing with pt earlier today. Klonopin was faxed to [facility] on may17th. Pt wants to use [facility] [place]. Called [facility] to see if pt picked up. They state they never received rx for klonopin. Consult with dr Nicki Reaper ok to give one refill on klonopin. All three meds sent to pharm. Left message to return call with pt to let her know meds were sent to pharm and also to discuss doing the bloodwork ( orders put in already).

## 2016-11-04 NOTE — Telephone Encounter (Signed)
Pt states meds were changed in hospital. She is now taking lisinopril 20mg  one daily,prozac 40mg  one daily and also needs a refill on klonopin 0.5mg  one bid. Does not see new psy until mid June. [facility] [place]

## 2016-11-04 NOTE — Telephone Encounter (Signed)
Patient said that Prozac and lisinopril were called in for her but with the wrong dosages and she is requesting a nurse to call her back.

## 2016-11-04 NOTE — Telephone Encounter (Signed)
Nurse's-#1 I believe Klonopin was just prescribed please double check #2 it is fine to order the update dose of her lisinopril and Prozac, lisinopril may have 6 month supply 20 mg daily, Prozac 40 mg #30 one refill No. 3 follow-up met 7 within the next 2-3 weeks #4 patient needs to do a follow-up office visit within the next couple months to recheck blood pressure-please verify dosing with the patient

## 2016-11-05 NOTE — Telephone Encounter (Signed)
Discussed with pt. Pt verbalized understanding. Pt has a follow up ov in June and she will do bw in 2 -3 weeks.

## 2016-11-12 ENCOUNTER — Ambulatory Visit (INDEPENDENT_AMBULATORY_CARE_PROVIDER_SITE_OTHER): Payer: Self-pay | Admitting: Family Medicine

## 2016-11-12 ENCOUNTER — Encounter: Payer: Self-pay | Admitting: Family Medicine

## 2016-11-12 VITALS — BP 138/80 | Temp 98.0°F | Ht 72.0 in | Wt 278.0 lb

## 2016-11-12 DIAGNOSIS — R42 Dizziness and giddiness: Secondary | ICD-10-CM

## 2016-11-12 DIAGNOSIS — Z029 Encounter for administrative examinations, unspecified: Secondary | ICD-10-CM

## 2016-11-12 DIAGNOSIS — L03312 Cellulitis of back [any part except buttock]: Secondary | ICD-10-CM

## 2016-11-12 DIAGNOSIS — W57XXXA Bitten or stung by nonvenomous insect and other nonvenomous arthropods, initial encounter: Secondary | ICD-10-CM

## 2016-11-12 DIAGNOSIS — K521 Toxic gastroenteritis and colitis: Secondary | ICD-10-CM

## 2016-11-12 MED ORDER — DOXYCYCLINE HYCLATE 100 MG PO TABS: 100.0000 mg | ORAL_TABLET | Freq: Two times a day (BID) | ORAL | 0 refills | Status: DC

## 2016-11-12 NOTE — Progress Notes (Signed)
   Subjective:    Patient ID: Andrea [name], female    DOB: Jun 23, 1980, 36 y.o.   MRN: [id number]  HPIlightheaded and dizzy for the past 3 days. Intermittently patient feels somewhat dizzy she's been doing a great job with diet and exercises lost quite a bit of weight. She does get a little dizzy when she stands up.  Abdominal pain and Diarrhea for a few weeks.  She denies fever chills sweats denies bloody stools but relates a lot of cramping and diarrhea present for the past several weeks  Rash on left shoulder for about 3 days. She states she had a bug bites been present for several days now erythematous and getting larger no fever chills no headaches with it    Review of Systems See above. Denies wheezing coughing congestion.    Objective:   Physical Exam Lungs clear heart regular large erythematous area on the upper left back approximately 3 inches to 4 inches in diameter with central clearing       Assessment & Plan:  Tick-related illness possible Lyme's disease recommend doxycycline 100 mg twice a day for 2 weeks  Diarrhea probably related to metformin hold off on metformin for the next 2 weeks then give [place] an update if doing well with this will need to be on a different diabetic medicine  Dizziness possibly related to her diet and exercise and losing weight I did recheck her blood pressure sitting and standing it does show some drop when she stands I recommend she uses just a little more salt her diet

## 2016-11-25 ENCOUNTER — Encounter: Payer: Self-pay | Admitting: Family Medicine

## 2016-11-25 ENCOUNTER — Ambulatory Visit (INDEPENDENT_AMBULATORY_CARE_PROVIDER_SITE_OTHER): Payer: Self-pay | Admitting: Family Medicine

## 2016-11-25 VITALS — BP 116/64 | Ht 72.0 in | Wt 278.2 lb

## 2016-11-25 DIAGNOSIS — J4530 Mild persistent asthma, uncomplicated: Secondary | ICD-10-CM

## 2016-11-25 DIAGNOSIS — I10 Essential (primary) hypertension: Secondary | ICD-10-CM

## 2016-11-25 MED ORDER — FLUTICASONE PROPIONATE HFA 110 MCG/ACT IN AERO: 2.0000 | INHALATION_SPRAY | Freq: Two times a day (BID) | RESPIRATORY_TRACT | 3 refills | Status: DC

## 2016-11-25 MED ORDER — LISINOPRIL 5 MG PO TABS: 5.0000 mg | ORAL_TABLET | Freq: Every day | ORAL | 6 refills | Status: DC

## 2016-11-25 NOTE — Progress Notes (Signed)
   Subjective:    Patient ID: Andrea [name], female    DOB: 28-Feb-1981, 36 y.o.   MRN: [id number]  Depression         This is a chronic problem.  The current episode started more than 1 year ago.  Patient went to the ER because she noticed that she was having some coughing wheezing feel little short of breath feeling slightly unsteady the ER did evaluation told her she had asthma flareup.  Patient states that she feels she is doing better with the depression is seen psychiatry later this month  She is working currently in doing well with this Patient also has concerns of dizzy spells and SOB.   Review of Systems  Psychiatric/Behavioral: Positive for depression.  Relates intermittent wheezing no chest pressure pain discomfort no leg pain and leg swelling     Objective:   Physical Exam Lungs clear hearts regular pulse normal extremities no edema skin warm dry   Patient relates that her blood pressures been low several times in the 90/60 range this could be concerning to her dizziness    Assessment & Plan:  Intermittent asthma flareups with intermittent use of albuterol we will use Flovent to see if we can prevent this from flaring up bad enough to in the up in ER ER records requested Flovent 110, 2 puffs twice a day, try to get this through the [place] assistance program prescription given to the patient Follow-up with psychiatry as planned Follow-up here in 3-4 months for recheck Blood pressure doing better she does not need as much medication we will reduce it down to 5 mg

## 2016-11-26 ENCOUNTER — Encounter: Payer: Self-pay | Admitting: Family Medicine

## 2016-11-26 NOTE — Telephone Encounter (Signed)
Please let the patient know-I would recommend prednisone 20 mg tablet, 3 per day for the next 5 days. In addition to this Keflex 500 mg 1 3 times a day for the next 7 days, if ongoing troubles I recommend the patient to follow-up, please go ahead and give work excuse for Tuesday Wednesday and Thursday and Friday if she needs anything else she needs to let us know give [place] a phone call in follow-up

## 2016-11-27 ENCOUNTER — Encounter: Payer: Self-pay | Admitting: Family Medicine

## 2016-11-27 ENCOUNTER — Other Ambulatory Visit: Payer: Self-pay

## 2016-11-27 MED ORDER — PREDNISONE 20 MG PO TABS: ORAL_TABLET | ORAL | 0 refills | Status: DC

## 2016-11-27 MED ORDER — CEPHALEXIN 500 MG PO CAPS: ORAL_CAPSULE | ORAL | 0 refills | Status: DC

## 2016-12-16 ENCOUNTER — Other Ambulatory Visit: Payer: Self-pay | Admitting: Nurse Practitioner

## 2016-12-16 ENCOUNTER — Telehealth: Payer: Self-pay | Admitting: Family Medicine

## 2016-12-16 ENCOUNTER — Other Ambulatory Visit: Payer: Self-pay | Admitting: *Deleted

## 2016-12-16 MED ORDER — ALBUTEROL SULFATE HFA 108 (90 BASE) MCG/ACT IN AERS: 2.0000 | INHALATION_SPRAY | RESPIRATORY_TRACT | 2 refills | Status: DC | PRN

## 2016-12-16 NOTE — Telephone Encounter (Signed)
Refill sent in

## 2016-12-16 NOTE — Telephone Encounter (Signed)
Pt.notified

## 2016-12-16 NOTE — Telephone Encounter (Signed)
Patient with a history asthma but has not had an inhaler sent in since 2015 and would like an inhaler sent to pharmacy

## 2016-12-16 NOTE — Telephone Encounter (Signed)
Patient says she had a bad asthma attack last Friday.  She is requesting Rx for her rescue inhaler called in.   [employer]

## 2016-12-17 ENCOUNTER — Telehealth: Payer: Self-pay | Admitting: Family Medicine

## 2016-12-17 ENCOUNTER — Other Ambulatory Visit: Payer: Self-pay | Admitting: Nurse Practitioner

## 2016-12-17 MED ORDER — ALBUTEROL SULFATE HFA 108 (90 BASE) MCG/ACT IN AERS: 2.0000 | INHALATION_SPRAY | RESPIRATORY_TRACT | 2 refills | Status: DC | PRN

## 2016-12-17 NOTE — Telephone Encounter (Signed)
Pt is needing a prescription for her albuterol (PROVENTIL HFA;VENTOLIN HFA) 108 (90 Base) MCG/ACT inhaler  Faxed to [place] with a cover letter with her name and birthdate on it to [phone] so that she can get her inhaler free. Pt states that she is already approved they are just waiting on the prescription and cover sheet.

## 2016-12-17 NOTE — Telephone Encounter (Signed)
Prescription faxed to [facility] to number provided by patient.

## 2016-12-17 NOTE — Telephone Encounter (Signed)
Rx will be at nurses desk for name brand Ventolin

## 2016-12-20 ENCOUNTER — Inpatient Hospital Stay (HOSPITAL_COMMUNITY)
Admission: AD | Admit: 2016-12-20 | Discharge: 2016-12-24 | DRG: 885 | Disposition: A | Discharge status: Home / Self Care | Payer: No Typology Code available for payment source | Source: Intra-hospital | Attending: Psychiatry | Admitting: Psychiatry

## 2016-12-20 ENCOUNTER — Encounter (HOSPITAL_COMMUNITY): Payer: Self-pay | Admitting: *Deleted

## 2016-12-20 ENCOUNTER — Emergency Department (HOSPITAL_COMMUNITY)
Admission: EM | Admit: 2016-12-20 | Discharge: 2016-12-20 | Disposition: A | Discharge status: Other Acute Inpatient Hospital | Payer: Self-pay | Attending: Emergency Medicine | Admitting: Emergency Medicine

## 2016-12-20 ENCOUNTER — Encounter (HOSPITAL_COMMUNITY): Payer: Self-pay

## 2016-12-20 DIAGNOSIS — I10 Essential (primary) hypertension: Secondary | ICD-10-CM | POA: Insufficient documentation

## 2016-12-20 DIAGNOSIS — F315 Bipolar disorder, current episode depressed, severe, with psychotic features: Secondary | ICD-10-CM | POA: Diagnosis present

## 2016-12-20 DIAGNOSIS — Z79899 Other long term (current) drug therapy: Secondary | ICD-10-CM | POA: Diagnosis not present

## 2016-12-20 DIAGNOSIS — E119 Type 2 diabetes mellitus without complications: Secondary | ICD-10-CM | POA: Diagnosis present

## 2016-12-20 DIAGNOSIS — K219 Gastro-esophageal reflux disease without esophagitis: Secondary | ICD-10-CM | POA: Diagnosis present

## 2016-12-20 DIAGNOSIS — E1165 Type 2 diabetes mellitus with hyperglycemia: Secondary | ICD-10-CM | POA: Diagnosis present

## 2016-12-20 DIAGNOSIS — R45851 Suicidal ideations: Secondary | ICD-10-CM | POA: Insufficient documentation

## 2016-12-20 DIAGNOSIS — F410 Panic disorder [episodic paroxysmal anxiety] without agoraphobia: Secondary | ICD-10-CM | POA: Diagnosis present

## 2016-12-20 DIAGNOSIS — Z046 Encounter for general psychiatric examination, requested by authority: Secondary | ICD-10-CM | POA: Insufficient documentation

## 2016-12-20 DIAGNOSIS — G4700 Insomnia, unspecified: Secondary | ICD-10-CM | POA: Diagnosis present

## 2016-12-20 DIAGNOSIS — F411 Generalized anxiety disorder: Secondary | ICD-10-CM | POA: Diagnosis present

## 2016-12-20 DIAGNOSIS — F3130 Bipolar disorder, current episode depressed, mild or moderate severity, unspecified: Secondary | ICD-10-CM | POA: Diagnosis present

## 2016-12-20 DIAGNOSIS — F259 Schizoaffective disorder, unspecified: Secondary | ICD-10-CM | POA: Diagnosis present

## 2016-12-20 DIAGNOSIS — Z6837 Body mass index (BMI) 37.0-37.9, adult: Secondary | ICD-10-CM | POA: Diagnosis not present

## 2016-12-20 DIAGNOSIS — Z885 Allergy status to narcotic agent status: Secondary | ICD-10-CM | POA: Insufficient documentation

## 2016-12-20 DIAGNOSIS — G43909 Migraine, unspecified, not intractable, without status migrainosus: Secondary | ICD-10-CM | POA: Diagnosis present

## 2016-12-20 DIAGNOSIS — F209 Schizophrenia, unspecified: Secondary | ICD-10-CM | POA: Insufficient documentation

## 2016-12-20 DIAGNOSIS — F3163 Bipolar disorder, current episode mixed, severe, without psychotic features: Secondary | ICD-10-CM | POA: Diagnosis present

## 2016-12-20 DIAGNOSIS — F319 Bipolar disorder, unspecified: Secondary | ICD-10-CM | POA: Insufficient documentation

## 2016-12-20 HISTORY — DX: Schizophrenia, unspecified: F20.9

## 2016-12-20 HISTORY — DX: Bipolar disorder, current episode depressed, mild or moderate severity, unspecified: F31.30

## 2016-12-20 HISTORY — DX: Bipolar disorder, current episode mixed, severe, without psychotic features: F31.63

## 2016-12-20 HISTORY — DX: Bipolar disorder, unspecified: F31.9

## 2016-12-20 LAB — RAPID URINE DRUG SCREEN, HOSP PERFORMED
Amphetamines: NOT DETECTED
BENZODIAZEPINES: NOT DETECTED
Barbiturates: NOT DETECTED
COCAINE: NOT DETECTED
OPIATES: NOT DETECTED
TETRAHYDROCANNABINOL: NOT DETECTED

## 2016-12-20 LAB — CBC
HEMATOCRIT: 38.3 % (ref 36.0–46.0)
Hemoglobin: 12.3 g/dL (ref 12.0–15.0)
MCH: 29.2 pg (ref 26.0–34.0)
MCHC: 32.1 g/dL (ref 30.0–36.0)
MCV: 91 fL (ref 78.0–100.0)
Platelets: 210 10*3/uL (ref 150–400)
RBC: 4.21 MIL/uL (ref 3.87–5.11)
RDW: 13.3 % (ref 11.5–15.5)
WBC: 6.8 10*3/uL (ref 4.0–10.5)

## 2016-12-20 LAB — ETHANOL: Alcohol, Ethyl (B): <5 mg/dL (ref <5)

## 2016-12-20 LAB — BASIC METABOLIC PANEL
ANION GAP: 10 (ref 5–15)
BUN: 14 mg/dL (ref 6–20)
CALCIUM: 9.4 mg/dL (ref 8.9–10.3)
CO2: 27 mmol/L (ref 22–32)
Chloride: 101 mmol/L (ref 101–111)
Creatinine, Ser: 0.95 mg/dL (ref 0.44–1.00)
GLUCOSE: 131 mg/dL — AB (ref 65–99)
Potassium: 3.2 mmol/L — ABNORMAL LOW (ref 3.5–5.1)
Sodium: 138 mmol/L (ref 135–145)

## 2016-12-20 LAB — I-STAT BETA HCG BLOOD, ED (MC, WL, AP ONLY): I-stat hCG, quantitative: <5 m[IU]/mL (ref <5)

## 2016-12-20 LAB — GLUCOSE, CAPILLARY: Glucose-Capillary: 124 mg/dL — ABNORMAL HIGH (ref 65–99)

## 2016-12-20 MED ORDER — FLUTICASONE PROPIONATE HFA 110 MCG/ACT IN AERO: 2.0000 | INHALATION_SPRAY | Freq: Two times a day (BID) | RESPIRATORY_TRACT | Status: DC

## 2016-12-20 MED ORDER — DIPHENHYDRAMINE HCL 25 MG PO CAPS: 50.0000 mg | ORAL_CAPSULE | Freq: Once | ORAL | Status: DC | PRN

## 2016-12-20 MED ORDER — ALUM & MAG HYDROXIDE-SIMETH 200-200-20 MG/5ML PO SUSP: 30.0000 mL | Freq: Four times a day (QID) | ORAL | Status: DC | PRN

## 2016-12-20 MED ORDER — IBUPROFEN 600 MG PO TABS
600.0000 mg | ORAL_TABLET | Freq: Three times a day (TID) | ORAL | Status: DC | PRN
  Administered 2016-12-22 – 2016-12-23 (×4): 600 mg via ORAL
  Filled 2016-12-20 (×4): qty 1

## 2016-12-20 MED ORDER — IBUPROFEN 400 MG PO TABS: 600.0000 mg | ORAL_TABLET | Freq: Three times a day (TID) | ORAL | Status: DC | PRN

## 2016-12-20 MED ORDER — TRAZODONE HCL 50 MG PO TABS: 100.0000 mg | ORAL_TABLET | Freq: Every evening | ORAL | Status: DC | PRN

## 2016-12-20 MED ORDER — INSULIN ASPART 100 UNIT/ML ~~LOC~~ SOLN: 0.0000 [IU] | Freq: Three times a day (TID) | SUBCUTANEOUS | Status: DC

## 2016-12-20 MED ORDER — TRAZODONE HCL 100 MG PO TABS
100.0000 mg | ORAL_TABLET | Freq: Every evening | ORAL | Status: DC | PRN
  Administered 2016-12-20 – 2016-12-23 (×3): 100 mg via ORAL
  Filled 2016-12-20 (×4): qty 1
  Filled 2016-12-20: qty 7

## 2016-12-20 MED ORDER — MAGNESIUM HYDROXIDE 400 MG/5ML PO SUSP: 30.0000 mL | Freq: Every day | ORAL | Status: DC | PRN

## 2016-12-20 MED ORDER — BUDESONIDE 0.25 MG/2ML IN SUSP
0.2500 mg | Freq: Two times a day (BID) | RESPIRATORY_TRACT | Status: DC
  Filled 2016-12-20 (×6): qty 2

## 2016-12-20 MED ORDER — ALBUTEROL SULFATE HFA 108 (90 BASE) MCG/ACT IN AERS
2.0000 | INHALATION_SPRAY | RESPIRATORY_TRACT | Status: DC | PRN
  Administered 2016-12-22 (×2): 2 via RESPIRATORY_TRACT
  Filled 2016-12-20 (×2): qty 6.7

## 2016-12-20 MED ORDER — LORAZEPAM 1 MG PO TABS: 2.0000 mg | ORAL_TABLET | Freq: Once | ORAL | Status: DC | PRN

## 2016-12-20 MED ORDER — LISINOPRIL 5 MG PO TABS
5.0000 mg | ORAL_TABLET | Freq: Every day | ORAL | Status: DC
  Filled 2016-12-20: qty 1

## 2016-12-20 MED ORDER — LORAZEPAM 2 MG/ML IJ SOLN: 2.0000 mg | Freq: Once | INTRAMUSCULAR | Status: DC | PRN

## 2016-12-20 MED ORDER — FLUOXETINE HCL 20 MG PO CAPS
40.0000 mg | ORAL_CAPSULE | Freq: Every day | ORAL | Status: DC
  Filled 2016-12-20: qty 2

## 2016-12-20 MED ORDER — HALOPERIDOL LACTATE 5 MG/ML IJ SOLN: 10.0000 mg | Freq: Once | INTRAMUSCULAR | Status: DC | PRN

## 2016-12-20 MED ORDER — HYDROXYZINE HCL 25 MG PO TABS
25.0000 mg | ORAL_TABLET | Freq: Four times a day (QID) | ORAL | Status: DC | PRN
  Administered 2016-12-21 – 2016-12-23 (×5): 25 mg via ORAL
  Filled 2016-12-20 (×3): qty 1
  Filled 2016-12-20: qty 10
  Filled 2016-12-20 (×3): qty 1

## 2016-12-20 MED ORDER — HYDROCHLOROTHIAZIDE 25 MG PO TABS
25.0000 mg | ORAL_TABLET | Freq: Every day | ORAL | Status: DC
  Filled 2016-12-20: qty 1

## 2016-12-20 MED ORDER — ARIPIPRAZOLE 5 MG PO TABS
5.0000 mg | ORAL_TABLET | ORAL | Status: DC
  Filled 2016-12-20 (×3): qty 1

## 2016-12-20 MED ORDER — ONDANSETRON HCL 4 MG PO TABS
4.0000 mg | ORAL_TABLET | Freq: Three times a day (TID) | ORAL | Status: DC | PRN
Start: 2016-12-20 — End: 2016-12-20

## 2016-12-20 MED ORDER — HYDROCHLOROTHIAZIDE 25 MG PO TABS
25.0000 mg | ORAL_TABLET | Freq: Every day | ORAL | Status: DC
  Administered 2016-12-21 – 2016-12-24 (×4): 25 mg via ORAL
  Filled 2016-12-20: qty 7
  Filled 2016-12-20 (×5): qty 1

## 2016-12-20 MED ORDER — ALBUTEROL SULFATE HFA 108 (90 BASE) MCG/ACT IN AERS: 2.0000 | INHALATION_SPRAY | RESPIRATORY_TRACT | Status: DC | PRN

## 2016-12-20 MED ORDER — TOPIRAMATE 100 MG PO TABS
100.0000 mg | ORAL_TABLET | Freq: Two times a day (BID) | ORAL | Status: DC
  Filled 2016-12-20 (×7): qty 1

## 2016-12-20 MED ORDER — INSULIN ASPART 100 UNIT/ML ~~LOC~~ SOLN
0.0000 [IU] | Freq: Three times a day (TID) | SUBCUTANEOUS | Status: DC
Start: 2016-12-21 — End: 2016-12-24
  Administered 2016-12-23: 2 [IU] via SUBCUTANEOUS

## 2016-12-20 MED ORDER — CLONAZEPAM 0.5 MG PO TABS: 0.5000 mg | ORAL_TABLET | Freq: Two times a day (BID) | ORAL | Status: DC | PRN

## 2016-12-20 MED ORDER — FLUOXETINE HCL 20 MG PO CAPS
40.0000 mg | ORAL_CAPSULE | Freq: Every day | ORAL | Status: DC
  Administered 2016-12-21 – 2016-12-24 (×4): 40 mg via ORAL
  Filled 2016-12-20 (×5): qty 2
  Filled 2016-12-20: qty 14

## 2016-12-20 MED ORDER — TOPIRAMATE 100 MG PO TABS
100.0000 mg | ORAL_TABLET | Freq: Two times a day (BID) | ORAL | Status: DC
  Administered 2016-12-20 – 2016-12-24 (×8): 100 mg via ORAL
  Filled 2016-12-20 (×3): qty 1
  Filled 2016-12-20: qty 14
  Filled 2016-12-20 (×7): qty 1
  Filled 2016-12-20: qty 14
  Filled 2016-12-20: qty 1

## 2016-12-20 MED ORDER — ALUM & MAG HYDROXIDE-SIMETH 200-200-20 MG/5ML PO SUSP
30.0000 mL | ORAL | Status: DC | PRN
  Administered 2016-12-21 – 2016-12-23 (×2): 30 mL via ORAL
  Filled 2016-12-20 (×2): qty 30

## 2016-12-20 MED ORDER — LISINOPRIL 5 MG PO TABS
5.0000 mg | ORAL_TABLET | Freq: Every day | ORAL | Status: DC
  Administered 2016-12-21 – 2016-12-24 (×4): 5 mg via ORAL
  Filled 2016-12-20: qty 7
  Filled 2016-12-20 (×5): qty 1

## 2016-12-20 MED ORDER — HALOPERIDOL 5 MG PO TABS
10.0000 mg | ORAL_TABLET | Freq: Once | ORAL | Status: DC | PRN
Start: 2016-12-20 — End: 2016-12-24

## 2016-12-20 MED ORDER — ARIPIPRAZOLE 5 MG PO TABS
5.0000 mg | ORAL_TABLET | ORAL | Status: DC
  Administered 2016-12-21: 5 mg via ORAL
  Filled 2016-12-20 (×4): qty 1

## 2016-12-20 MED ORDER — ACETAMINOPHEN 325 MG PO TABS
650.0000 mg | ORAL_TABLET | ORAL | Status: DC | PRN
Start: 2016-12-20 — End: 2016-12-20

## 2016-12-20 MED ORDER — DIPHENHYDRAMINE HCL 50 MG/ML IJ SOLN: 50.0000 mg | Freq: Once | INTRAMUSCULAR | Status: DC | PRN

## 2016-12-20 MED ORDER — ACETAMINOPHEN 325 MG PO TABS
650.0000 mg | ORAL_TABLET | Freq: Four times a day (QID) | ORAL | Status: DC | PRN
  Administered 2016-12-20 – 2016-12-23 (×2): 650 mg via ORAL
  Filled 2016-12-20 (×2): qty 2

## 2016-12-20 NOTE — ED Notes (Signed)
Pt also states she is bipolar and has a "little bit of schizophrenia""

## 2016-12-20 NOTE — Progress Notes (Signed)
Adult Psychoeducational Group Note  Date:  12/20/2016 Time:  9:15 PM  Group Topic/Focus:  Wrap-Up Group:   The focus of this group is to help patients review their daily goal of treatment and discuss progress on daily workbooks.  Participation Level:  Minimal  Participation Quality:  Appropriate  Affect:  Appropriate  Cognitive:  Appropriate  Insight: Appropriate  Engagement in Group:  Engaged  Modes of Intervention:  Discussion  Additional Comments:  Pt stated her goal for today was to meet and interact with peers and staff. Pt stated she accomplished her goals today and felt good about it. Pt rated her day a 7 out of 10.  Candy Sledge 12/20/2016, 9:15 PM

## 2016-12-20 NOTE — ED Notes (Signed)
Pt wanded by security, changed into paper scrubs. Belongings placed in locker

## 2016-12-20 NOTE — ED Notes (Signed)
Out of bed to br- ambulates heel to toe without stagger or drift

## 2016-12-20 NOTE — ED Triage Notes (Signed)
Pt reports that she been hearing voices since this morning and the voices here to take all her pills and to cut herself. Pt called crisis line and was sent here. Pt is calm and cooperative

## 2016-12-20 NOTE — ED Notes (Signed)
Pt reports last admission in [facility]" in May Reports clinicians are in [facility] and last seen in June Reports "hearing voices telling me to hurt myself..last time I took an overdose" This SmartLink has not been configured with any valid records.

## 2016-12-20 NOTE — BH Assessment (Signed)
Tele Assessment Note   Andrea [name] is an 36 y.o. female presenting to the [facility] experiencing hallucinations with commands to take her life.  Recently started hearing voices in the spring of 2018, acted on commands to take her life and was hospitalized at [facility]. Since her discharged was placed on Abilify, 2mg . Due to continued hallucinations the dosage was increased to Abilify 5mg .  Symptoms had improved until the past week. Patient has experienced a recurrence of hallucinations over the past week, commands to take her life by OD or cutting herself.   The patient receives services at [facility], weekly therapy and once monthly psychiatry. 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 Has family support in the [place] area and friends locally. The patient was living with a friend but just moved out by herself. The patient expressed this was a stressor for her. Denies HI. Denies drug use.  The patient had unremarkable appearance, good eye contact, logical speech, depressed mood and affect, reports daily panic attacks, had partial judgement, fair insight and impulse control. Recognized need for treatment. Willing to sign into a facility voluntarily.   Jinny Blossom, NP recommends inpatient psychiatric treatment.   Diagnosis: Bipolar 1 disorder, current depressed, with psychotic features  Past Medical History:  Past Medical History:  Diagnosis Date  . Anxiety   . Bipolar 1 disorder ([place])   . Complication of anesthesia    hard time waking up   . Depression   . GERD (gastroesophageal reflux disease)    no meds  . Kidney stones   . Low back pain   . Migraines   . PCOS (polycystic ovarian syndrome)   . PONV (postoperative nausea and vomiting)   . Schizophrenia ([place])   . Shortness of breath dyspnea    with bronchitis    Past Surgical History:  Procedure Laterality Date  . ABDOMINAL HYSTERECTOMY N/A 11/14/2015   Procedure: HYSTERECTOMY ABDOMINAL;  Surgeon: Olga Millers, MD;  Location: [facility];  Service: Gynecology;  Laterality: N/A;  . CHOLECYSTECTOMY    . INTRAUTERINE DEVICE (IUD) INSERTION  06/14/2014   [facility]  . LYMPH NODES REMOVED    . ovarian cyst removed    . SALPINGOOPHORECTOMY Bilateral 11/14/2015   Procedure: SALPINGO OOPHORECTOMY;  Surgeon: Olga Millers, MD;  Location: [facility];  Service: Gynecology;  Laterality: Bilateral;    Family History: No family history on file.  Social History:  reports that she has never smoked. She has never used smokeless tobacco. She reports that she does not drink alcohol or use drugs.  Additional Social History:  Alcohol / Drug Use Pain Medications: see MAR Prescriptions: see MAR Over the Counter: see MAR History of alcohol / drug use?: No history of alcohol / drug abuse  CIWA: CIWA-Ar BP: 125/81 Pulse Rate: 68 COWS:    PATIENT STRENGTHS: (choose at least two) Average or above average intelligence General fund of knowledge  Allergies:  Allergies  Allergen Reactions  . Bactrim [Sulfamethoxazole-Trimethoprim] Hives and Itching  . Vicodin [Hydrocodone-Acetaminophen] Hives and Itching    Home Medications:  (Not in a hospital admission)  OB/GYN Status:  No LMP recorded (lmp unknown). Patient has had a hysterectomy.  General Assessment Data Location of Assessment: [facility] TTS Assessment: In system Is this a Tele or Face-to-Face Assessment?: Tele Assessment Is this an Initial Assessment or a Re-assessment for this encounter?: Initial Assessment Marital status: Single Is patient pregnant?: No Pregnancy Status: No Living Arrangements: Alone Can pt  return to current living arrangement?: Yes Admission Status: Voluntary Is patient capable of signing voluntary admission?: Yes Referral Source: Self/Family/Friend Insurance type: self pay  Medical Screening Exam ([place]) Medical Exam completed: Yes  Crisis Care Plan Living Arrangements: Alone Name of Psychiatrist:  [facility] [address] Name of Therapist: [facility]  Education Status Is patient currently in school?: Yes Highest grade of school patient has completed: some college  Risk to self with the past 6 months Suicidal Ideation: Yes-Currently Present Has patient been a risk to self within the past 6 months prior to admission? : Yes Suicidal Intent: Yes-Currently Present Has patient had any suicidal intent within the past 6 months prior to admission? : Yes Is patient at risk for suicide?: Yes Suicidal Plan?: Yes-Currently Present Has patient had any suicidal plan within the past 6 months prior to admission? : Yes Specify Current Suicidal Plan: od or cut self Access to Means: Yes Specify Access to Suicidal Means: has pills, sharp objects What has been your use of drugs/alcohol within the last 12 months?: n/a Previous Attempts/Gestures: Yes How many times?: 1 Other Self Harm Risks: 1 Triggers for Past Attempts: Hallucinations Intentional Self Injurious Behavior: None Family Suicide History: Unknown Recent stressful life event(s): Other (Comment) (new living situation) Persecutory voices/beliefs?: No Depression: Yes Depression Symptoms: Insomnia, Loss of interest in usual pleasures Substance abuse history and/or treatment for substance abuse?: No Suicide prevention information given to non-admitted patients: Not applicable  Risk to Others within the past 6 months Homicidal Ideation: No Does patient have any lifetime risk of violence toward others beyond the six months prior to admission? : No Thoughts of Harm to Others: No Current Homicidal Intent: No Current Homicidal Plan: No Access to Homicidal Means: No Identified Victim: n/a History of harm to others?: No Assessment of Violence: None Noted Does patient have access to weapons?: No Criminal Charges Pending?: No Does patient have a court date: No Is patient on probation?: No  Psychosis Hallucinations: Auditory, With command Delusions:  Unspecified  Mental Status Report Appearance/Hygiene: Unremarkable Eye Contact: Good Motor Activity: Freedom of movement Speech: Logical/coherent Level of Consciousness: Alert Mood: Depressed Affect: Depressed Anxiety Level: Panic Attacks Panic attack frequency: daily Most recent panic attack: early this morning Thought Processes: Coherent, Relevant Judgement: Partial (recognized need for treatment) Orientation: Person, Place, Time, Situation Obsessive Compulsive Thoughts/Behaviors: None  Cognitive Functioning Concentration: Normal Memory: Recent Intact, Remote Intact IQ: Average Insight: Fair Impulse Control: Fair Appetite: Poor Weight Loss: 0 Weight Gain: 0 Sleep: No Change Vegetative Symptoms: Staying in bed, Not bathing, Decreased grooming  ADLScreening [facility] Assessment Services) Patient's cognitive ability adequate to safely complete daily activities?: Yes Patient able to express need for assistance with ADLs?: Yes Independently performs ADLs?: Yes (appropriate for developmental age)  Prior Inpatient Therapy Prior Inpatient Therapy: Yes Prior Therapy Dates: 10/2016 Prior Therapy Facilty/Provider(s): [facility] Reason for Treatment: SI, commands,   Prior Outpatient Therapy Prior Outpatient Therapy: Yes Prior Therapy Dates: weekly therapy Prior Therapy Facilty/Provider(s): [facility] Reason for Treatment: bipolar Does patient have an ACCT team?: No Does patient have Intensive In-House Services?  : No Does patient have [facility] services? : No Does patient have P4CC services?: No  ADL Screening (condition at time of admission) Patient's cognitive ability adequate to safely complete daily activities?: Yes Is the patient deaf or have difficulty hearing?: No Does the patient have difficulty seeing, even when wearing glasses/contacts?: No Does the patient have difficulty concentrating, remembering, or making decisions?: No Patient able to express  need for  assistance with ADLs?: Yes Does the patient have difficulty dressing or bathing?: No Independently performs ADLs?: Yes (appropriate for developmental age)       Abuse/Neglect Assessment (Assessment to be complete while patient is alone) Physical Abuse: Denies Verbal Abuse: Denies Sexual Abuse: Denies     Advance Directives (For Healthcare) Does Patient Have a Medical Advance Directive?: No    Additional Information 1:1 In Past 12 Months?: No CIRT Risk: No Elopement Risk: No Does patient have medical clearance?: Yes     Disposition:  Disposition Initial Assessment Completed for this Encounter: Yes Disposition of Patient: Inpatient treatment program Type of inpatient treatment program: Adult  Mollie Germany 12/20/2016 1:36 PM

## 2016-12-20 NOTE — ED Provider Notes (Signed)
[facility] DEPT Provider Note   CSN: [id number] Arrival date & time: 12/20/16  1017     History   Chief Complaint Chief Complaint  Patient presents with  . V70.1    HPI Andrea [name] is a 36 y.o. female.  HPI Patient has a history of bipolar disorder presents the emergency department with reported increasing auditory hallucinations telling her to hurt herself.  She came here on her own accord.  She has been hospitalized for this before.  She contacted the crisis line today recommend that she come to the ER for evaluation.  Denies chest pain shortness of breath.  No attempted suicide today.   Past Medical History:  Diagnosis Date  . Anxiety   . Bipolar 1 disorder ([place])   . Complication of anesthesia    hard time waking up   . Depression   . GERD (gastroesophageal reflux disease)    no meds  . Kidney stones   . Low back pain   . Migraines   . PCOS (polycystic ovarian syndrome)   . PONV (postoperative nausea and vomiting)   . Schizophrenia ([place])   . Shortness of breath dyspnea    with bronchitis    Patient Active Problem List   Diagnosis Date Noted  . Type 2 diabetes mellitus with hyperglycemia ([place]) 09/18/2016  . HTN (hypertension) 09/18/2016  . Seizures ([place]) 06/04/2016  . Pelvic pain in female 11/14/2015  . Acute pyelonephritis 01/16/2013  . Migraines 10/30/2012  . Morbid obesity ([place]) 10/30/2012  . Depression with anxiety 10/30/2012  . Kidney stones     Past Surgical History:  Procedure Laterality Date  . ABDOMINAL HYSTERECTOMY N/A 11/14/2015   Procedure: HYSTERECTOMY ABDOMINAL;  Surgeon: Olga Millers, MD;  Location: [facility];  Service: Gynecology;  Laterality: N/A;  . CHOLECYSTECTOMY    . INTRAUTERINE DEVICE (IUD) INSERTION  06/14/2014   [facility]  . LYMPH NODES REMOVED    . ovarian cyst removed    . SALPINGOOPHORECTOMY Bilateral 11/14/2015   Procedure: SALPINGO OOPHORECTOMY;  Surgeon: Olga Millers, MD;  Location: [facility];  Service:  Gynecology;  Laterality: Bilateral;    OB History    No data available       Home Medications    Prior to Admission medications   Medication Sig Start Date End Date Taking? Authorizing Provider  albuterol (PROVENTIL HFA;VENTOLIN HFA) 108 (90 Base) MCG/ACT inhaler Inhale 2 puffs into the lungs every 4 (four) hours as needed for wheezing. Patient taking differently: Inhale 2 puffs into the lungs every 8 (eight) hours as needed for wheezing or shortness of breath.  12/17/16  Yes Nilda Simmer, NP  ARIPiprazole (ABILIFY) 5 MG tablet Take 5 mg by mouth every morning.   Yes [provider]  clonazePAM (KLONOPIN) 0.5 MG tablet Take 1 tablet (0.5 mg total) by mouth 2 (two) times daily as needed for anxiety. 11/04/16  Yes Luking, Elayne Snare, MD  cyclobenzaprine (FLEXERIL) 10 MG tablet TAKE ONE TABLET BY MOUTH AT BEDTIME AS NEEDED FOR MUSCLE SPASM 08/13/16  Yes Kathyrn Drown, MD  FLUoxetine (PROZAC) 40 MG capsule Take 1 capsule (40 mg total) by mouth daily. 11/04/16  Yes Luking, Elayne Snare, MD  fluticasone (FLOVENT HFA) 110 MCG/ACT inhaler Inhale 2 puffs into the lungs 2 (two) times daily. 11/25/16  Yes Kathyrn Drown, MD  hydrochlorothiazide (HYDRODIURIL) 25 MG tablet Take 1 tablet (25 mg total) by mouth daily. 10/31/16  Yes Kathyrn Drown, MD  lisinopril (PRINIVIL,ZESTRIL)  5 MG tablet Take 1 tablet (5 mg total) by mouth daily. 11/25/16  Yes Kathyrn Drown, MD  oxyCODONE-acetaminophen (PERCOCET) 10-325 MG tablet Take 1 tablet by mouth every 4 (four) hours as needed for pain. Patient taking differently: Take 0.5 tablets by mouth daily as needed (migraine).  10/14/16  Yes Luking, Elayne Snare, MD  tiZANidine (ZANAFLEX) 4 MG tablet Take 1 tablet (4 mg total) by mouth 3 (three) times daily as needed (Take as needed for migraines.  Do not take frequently.). Patient taking differently: Take 4 mg by mouth daily as needed (Take as needed for migraines.).  09/18/16  Yes Luking, Elayne Snare, MD  topiramate  (TOPAMAX) 100 MG tablet Take 1 tablet (100 mg total) by mouth 2 (two) times daily. 10/31/16  Yes Kathyrn Drown, MD  traZODone (DESYREL) 100 MG tablet Take 1 tablet (100 mg total) by mouth at bedtime as needed for sleep. Patient taking differently: Take 100-200 mg by mouth at bedtime as needed for sleep.  10/10/16  Yes Luking, Elayne Snare, MD  ARIPiprazole (ABILIFY) 2 MG tablet Take 1 tablet (2 mg total) by mouth daily. Patient not taking: Reported on 12/20/2016 10/31/16   Kathyrn Drown, MD  cephALEXin (KEFLEX) 500 MG capsule Take 1 tablet by mouth three times a day for 7 days. Patient not taking: Reported on 12/20/2016 11/27/16   Kathyrn Drown, MD  predniSONE (DELTASONE) 20 MG tablet Take 3 tablets by mouth daily for 5 days Patient not taking: Reported on 12/20/2016 11/27/16   Kathyrn Drown, MD  tamsulosin (FLOMAX) 0.4 MG CAPS capsule Take 1 capsule (0.4 mg total) by mouth daily. Patient not taking: Reported on 11/12/2016 10/14/16   Kathyrn Drown, MD    Family History No family history on file.  Social History Social History  Substance Use Topics  . Smoking status: Never Smoker  . Smokeless tobacco: Never Used  . Alcohol use No     Allergies   Bactrim [sulfamethoxazole-trimethoprim] and Vicodin [hydrocodone-acetaminophen]   Review of Systems Review of Systems  All other systems reviewed and are negative.    Physical Exam Updated Vital Signs BP 125/81 (BP Location: Left Arm)   Pulse 68   Temp 98.4 F (36.9 C) (Oral)   Resp 18   Ht 6' (1.829 m)   Wt 125.6 kg (277 lb)   LMP  (LMP Unknown)   SpO2 98%   BMI 37.57 kg/m   Physical Exam  Constitutional: She is oriented to person, place, and time. She appears well-developed and well-nourished. No distress.  HENT:  Head: Normocephalic and atraumatic.  Eyes: EOM are normal.  Neck: Normal range of motion.  Cardiovascular: Normal rate and regular rhythm.   Pulmonary/Chest: Effort normal and breath sounds normal.  Abdominal: Soft.  She exhibits no distension. There is no tenderness.  Musculoskeletal: Normal range of motion.  Neurological: She is alert and oriented to person, place, and time.  Skin: Skin is warm and dry.  Psychiatric:  Appears to be responding to internal stimuli  Nursing note and vitals reviewed.    ED Treatments / Results  Labs (all labs ordered are listed, but only abnormal results are displayed) Labs Reviewed  BASIC METABOLIC PANEL - Abnormal; Notable for the following:       Result Value   Potassium 3.2 (*)    Glucose, Bld 131 (*)    All other components within normal limits  CBC  ETHANOL  RAPID URINE DRUG SCREEN, HOSP PERFORMED  RAPID  URINE DRUG SCREEN, HOSP PERFORMED    EKG  EKG Interpretation None       Radiology No results found.  Procedures Procedures (including critical care time)  Medications Ordered in ED Medications - No data to display   Initial Impression / Assessment and Plan / ED Course  I have reviewed the triage vital signs and the nursing notes.  Pertinent labs & imaging results that were available during my care of the patient were reviewed by me and considered in my medical decision making (see chart for details).     Patient is medically clear.  Disposition per psychiatry.  TTS consultation pending  Final Clinical Impressions(s) / ED Diagnoses   Final diagnoses:  None    New Prescriptions New Prescriptions   No medications on file     Jola Schmidt, MD 12/20/16 1326

## 2016-12-20 NOTE — Tx Team (Signed)
Initial Treatment Plan 12/20/2016 5:14 PM [name] KPT:[id number]    PATIENT STRESSORS: Medication change or noncompliance Other: living situation   PATIENT STRENGTHS: Ability for insight Average or above average intelligence Capable of independent living General fund of knowledge Motivation for treatment/growth   PATIENT IDENTIFIED PROBLEMS: Depression Suicidal thoughts Auditory hallucinations "I don't want to hear the voices anymore or be depressed"                     DISCHARGE CRITERIA:  Ability to meet basic life and health needs Improved stabilization in mood, thinking, and/or behavior Verbal commitment to aftercare and medication compliance  PRELIMINARY DISCHARGE PLAN: Attend aftercare/continuing care group Return to previous living arrangement  PATIENT/FAMILY INVOLVEMENT: This treatment plan has been presented to and reviewed with the patient, Andrea [name], and/or family member, .  The patient and family have been given the opportunity to ask questions and make suggestions.  Dennison Mcdaid, [place], [place] 12/20/2016, 5:14 PM

## 2016-12-20 NOTE — Progress Notes (Signed)
35 year old female pt admitted on voluntary basis. On admission, Andrea [name] reports that she drove herself to the hospital due to an increase in auditory hallucinations and depression. Andrea [name] reports that she her psychiatrist prescribes her abilify and reports that her auditory hallucinations have gotten worse since an increase in that medication. Andrea [name] also reports depression and passive SI on admission but is able to contract for safety. Andrea [name] reports that she has been depressed for many years and has been on many different kinds of medications. Andrea [name] reports that she takes all medications as prescribed and denies any alcohol or substance abuse issues. Andrea [name] was oriented to the unit and safety maintained.

## 2016-12-20 NOTE — BH Assessment (Signed)
Andrea Blossom, NP recommends inpatient psychiatric treatment.  TTS to look for placement. [place] to consider.

## 2016-12-20 NOTE — ED Notes (Signed)
Late entry: call from [facility] ([facility] who will be delayed doing TTS

## 2016-12-20 NOTE — ED Notes (Signed)
Call to [facility]; pt meds are locked per EPIC, also pt reports that she has had all of scheduled meds

## 2016-12-21 DIAGNOSIS — E1165 Type 2 diabetes mellitus with hyperglycemia: Secondary | ICD-10-CM

## 2016-12-21 DIAGNOSIS — F3130 Bipolar disorder, current episode depressed, mild or moderate severity, unspecified: Secondary | ICD-10-CM

## 2016-12-21 DIAGNOSIS — R45851 Suicidal ideations: Secondary | ICD-10-CM

## 2016-12-21 LAB — GLUCOSE, CAPILLARY
GLUCOSE-CAPILLARY: 105 mg/dL — AB (ref 65–99)
GLUCOSE-CAPILLARY: 111 mg/dL — AB (ref 65–99)
GLUCOSE-CAPILLARY: 114 mg/dL — AB (ref 65–99)
GLUCOSE-CAPILLARY: 106 mg/dL — AB (ref 65–99)

## 2016-12-21 MED ORDER — ARIPIPRAZOLE 15 MG PO TABS
15.0000 mg | ORAL_TABLET | ORAL | Status: DC
  Administered 2016-12-22: 15 mg via ORAL
  Filled 2016-12-21 (×4): qty 1

## 2016-12-21 MED ORDER — ARIPIPRAZOLE 5 MG PO TABS
5.0000 mg | ORAL_TABLET | Freq: Once | ORAL | Status: AC
  Administered 2016-12-21: 5 mg via ORAL
  Filled 2016-12-21 (×2): qty 1

## 2016-12-21 MED ORDER — LORAZEPAM 1 MG PO TABS
2.0000 mg | ORAL_TABLET | Freq: Every day | ORAL | Status: AC
  Administered 2016-12-21: 2 mg via ORAL
  Filled 2016-12-21: qty 2
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
## 2016-12-21 NOTE — Progress Notes (Signed)
Pt on unit in dayroom.  Pt is quiet and has little interaction.  Pt attends group and participates.  Pt sts she has SI thoughts but is able to verbally contract for safety.  Pt denies HI and sts she hears "things" since an increase in Abilify.  Pt denies pain or discomfort.  Pt is cooperative and med compliant. Pt safety is maintained with q 15 min checks Pt remains safe on unit.

## 2016-12-21 NOTE — BHH Group Notes (Signed)
[facility] Group Notes:  (Clinical Social Work)  12/21/2016  11:15-12:00PM  Summary of Progress/Problems:   Today's process group involved patients discussing their feelings related to being hospitalized, as well as benefits they see to being in the hospital.  These were itemized on the whiteboard, and then the group brainstormed specific ways in which they could seek for those same benefits to happen when they discharge and go back home. The patient expressed a primary feeling about being hospitalized is "not so good" but I know I need help to get rid of the voices I keep hearing.  She stated she will know she is stable and ready for discharge when her medications are working, and knows that she needs to stay on them after discharge in order to avoid future hospitalization.  Type of Therapy:  Group Therapy - Process  Participation Level:  Active  Participation Quality:  Attentive and Sharing  Affect:  Blunted  Cognitive:  Appropriate  Insight:  Developing/Improving  Engagement in Therapy:  Engaged  Modes of Intervention:  Exploration, Discussion  Selmer Dominion, LCSW 12/21/2016, 12:50 PM

## 2016-12-21 NOTE — Plan of Care (Signed)
Problem: Activity: Goal: Interest or engagement in activities will improve Outcome: Progressing Patient is visible in milieu for therapy and activities. Goal: Sleeping patterns will improve Outcome: Progressing Report good night rest.  Slept 6.75 hours.  Problem: Education: Goal: Emotional status will improve Outcome: Not Progressing Patient presents with anxious affect and mood. Goal: Mental status will improve Outcome: Progressing Patient is alert and oriented x 4.  Problem: Safety: Goal: Periods of time without injury will increase Outcome: Not Progressing Patient is safe on the unit.  Routine safety checks maintained every 15 minutes.  Problem: Health Behavior/Discharge Planning: Goal: Compliance with prescribed medication regimen will improve Outcome: Progressing Patient is compliant with medication regimen.  Problem: Role Relationship: Goal: Ability to interact with others will improve Outcome: Progressing Patient observed interacting with peers in the dayroom.

## 2016-12-21 NOTE — BHH Group Notes (Signed)
[facility] Group Notes:  (Nursing/MHT/Case Management/Adjunct)  Date:  12/21/2016  Time:  6:03 PM  Type of Therapy:  Nurse Education  Participation Level:  Active  Participation Quality:  Appropriate  Affect:  Appropriate  Cognitive:  Appropriate  Insight:  Appropriate  Engagement in Group:  Engaged  Modes of Intervention:  Education  Summary of Progress/Problems:  This was a group focusing on sober living and relapse prevention.   Cheri Kearns 12/21/2016, 6:03 PM

## 2016-12-21 NOTE — BHH Suicide Risk Assessment (Signed)
[facility] Admission Suicide Risk Assessment   Nursing information obtained from:    Demographic factors:    Current Mental Status:    Loss Factors:    Historical Factors:    Risk Reduction Factors:     Total Time spent with patient: 45 minutes Principal Problem: Bipolar I disorder, most recent episode depressed ([place]) Diagnosis:   Patient Active Problem List   Diagnosis Date Noted  . Bipolar I disorder, most recent episode depressed ([place]) [F31.30] 12/20/2016  . Bipolar 1 disorder, mixed, severe ([place]) [F31.63] 12/20/2016  . Type 2 diabetes mellitus with hyperglycemia ([place]) [E11.65] 09/18/2016  . HTN (hypertension) [I10] 09/18/2016  . Seizures ([place]) [R56.9] 06/04/2016  . Pelvic pain in female [R10.2] 11/14/2015  . Acute pyelonephritis [N10] 01/16/2013  . Migraines [G43.909] 10/30/2012  . Morbid obesity ([place]) [E66.01] 10/30/2012  . Depression with anxiety [F41.8] 10/30/2012  . Kidney stones [N20.0]    Subjective Data:  36 yo Caucasian female, single, lives alone emoployed. Background history of Bipolar Disorder. Recently discharged from inpatient facility. Reports command auditory hallucination. Had responded to low dose Abilify. Command to hurt self has been worsening. Hears a female voice. Has been hearing this voice for over a year now. Does not know the identity of this person. Gets anxious from the voice. Sometimes interferes with her sleep. Has lost some weight. Has been hypervigilant. No access to weapons. No past suicidal behavior. Family history of Bipolar Disorder and Schizophrenia in second generation relatives. Has suffered from depression until a year ago. Was on Venlafaxine and later switched to Prozac after she attempted suicide.  We discussed titration of Abilify to an antipsychotic dose. Patient consented with plan. Would use Ativan tonight as she would get a dose of Abilify this evening  Continued Clinical Symptoms:  Alcohol Use Disorder Identification Test Final Score (AUDIT):  0 The "Alcohol Use Disorders Identification Test", Guidelines for Use in Primary Care, Second Edition.  World [employer]). Score between 0-7:  no or low risk or alcohol related problems. Score between 8-15:  moderate risk of alcohol related problems. Score between 16-19:  high risk of alcohol related problems. Score 20 or above:  warrants further diagnostic evaluation for alcohol dependence and treatment.   CLINICAL FACTORS:   Psychosis Mood disorder   Musculoskeletal: Strength & Muscle Tone: within normal limits Gait & Station: normal Patient leans: N/A  Psychiatric Specialty Exam: Physical Exam  Constitutional: She is oriented to person, place, and time.  HENT:  Head: Normocephalic and atraumatic.  Eyes: Pupils are equal, round, and reactive to light.  Neck: Normal range of motion. Neck supple.  Cardiovascular: Normal rate.   Respiratory: Effort normal.  Neurological: She is alert and oriented to person, place, and time.  Skin: She is not diaphoretic.  Psychiatric:  As above    ROS  Blood pressure (!) 106/57, pulse 74, temperature 98.1 F (36.7 C), temperature source Oral, resp. rate 18, height 6' (1.829 m), weight 125.2 kg (276 lb).Body mass index is 37.43 kg/m.  General Appearance: Overweight, pleasant. engages well. Polite.   Eye Contact:  Good  Speech:  Clear and Coherent and Normal Rate  Volume:  Normal  Mood:  Overwhelmed by his subjective experience  Affect:  Appropriate and Full Range  Thought Process:  Linear  Orientation:  Full (Time, Place, and Person)  Thought Content:  Worried by the voice  Suicidal Thoughts:  Yes.  without intent/plan  Homicidal Thoughts:  No  Memory:  Immediate;   Good Recent;  Good Remote;   Good  Judgement:  Fair  Insight:  Good  Psychomotor Activity:  Normal  Concentration:  Concentration: Fair and Attention Span: Fair  Recall:  Good  Fund of Knowledge:  Good  Language:  Good  Akathisia:  Negative  Handed:     AIMS (if indicated):     Assets:  Communication Skills Desire for Improvement Financial Resources/Insurance Housing Intimacy Physical Health Transportation Vocational/Educational  ADL's:  Intact  Cognition:  WNL  Sleep:  Number of Hours: 6.25      COGNITIVE FEATURES THAT CONTRIBUTE TO RISK:  None    SUICIDE RISK:   Severe:  Frequent, intense, and enduring suicidal ideation, specific plan, no subjective intent, but some objective markers of intent (i.e., choice of lethal method), the method is accessible, some limited preparatory behavior, evidence of impaired self-control, severe dysphoria/symptomatology, multiple risk factors present, and few if any protective factors, particularly a lack of social support.  PLAN OF CARE:  1. Suicide precautions 2. Increase Abilify to 10 mg daily. Would titrate further tomorrow.   I certify that inpatient services furnished can reasonably be expected to improve the patient's condition.   Artist Beach, MD 12/21/2016, 6:42 PM

## 2016-12-21 NOTE — Progress Notes (Signed)
Andrea [name] had been up and visible in milieu this evening, did attend and participate in evening group activity. She has endorsed auditory hallucinations today as well as feelings of anxiety throughout the day. She was able to receive bedtime medications without incident and did not verbalize any complaints of pain. A. Support and encouragement provided. R. Safety maintained, will continue to monitor.

## 2016-12-22 LAB — GLUCOSE, CAPILLARY
GLUCOSE-CAPILLARY: 112 mg/dL — AB (ref 65–99)
Glucose-Capillary: 113 mg/dL — ABNORMAL HIGH (ref 65–99)
Glucose-Capillary: 114 mg/dL — ABNORMAL HIGH (ref 65–99)
Glucose-Capillary: 158 mg/dL — ABNORMAL HIGH (ref 65–99)

## 2016-12-22 MED ORDER — FLUTICASONE PROPIONATE HFA 110 MCG/ACT IN AERO
2.0000 | INHALATION_SPRAY | Freq: Two times a day (BID) | RESPIRATORY_TRACT | Status: DC
  Administered 2016-12-22 – 2016-12-24 (×4): 2 via RESPIRATORY_TRACT
  Filled 2016-12-22 (×2): qty 12

## 2016-12-22 NOTE — Progress Notes (Signed)
The patient attended the evening Wrap-Up group on the 500 hallway and was appropriate.

## 2016-12-22 NOTE — BHH Suicide Risk Assessment (Signed)
[facility] INPATIENT:  Family/Significant Other Suicide Prevention Education  Suicide Prevention Education:  Education Completed; mother Douglass Rivers [phone] has been identified by the patient as the family member/significant other with whom the patient will be residing, and identified as the person(s) who will aid the patient in the event of a mental health crisis (suicidal ideations/suicide attempt).  With written consent from the patient, the family member/significant other has been provided the following suicide prevention education, prior to the and/or following the discharge of the patient.  MOTHER VERY SUPPORTIVE, STATES THERE ARE NO FIREARMS IN THE HOME WHERE PT IS CURRENTLY RESIDING.  PT MAY RETURN TO LIVE WITH PARENTS IN [place], AND IF SO, MOTHER WOULD ASSIST IN SETTING UP FOLLOW-UP APPOINTMENTS FOR MEDICATION AND THERAPY.  THERE ARE NO FIREARMS IN THAT HOME EITHER.  The suicide prevention education provided includes the following:  Suicide risk factors  Suicide prevention and interventions  National Suicide Hotline telephone number  [facility] assessment telephone number  [facility] Emergency Assistance [place] and/or Residential Mobile Crisis Unit telephone number  Request made of family/significant other to:  Remove weapons (e.g., guns, rifles, knives), all items previously/currently identified as safety concern.    Remove drugs/medications (over-the-counter, prescriptions, illicit drugs), all items previously/currently identified as a safety concern.  The family member/significant other verbalizes understanding of the suicide prevention education information provided.  The family member/significant other agrees to remove the items of safety concern listed above.  Berlin Hun Grossman-Orr 12/22/2016, 4:33 PM

## 2016-12-22 NOTE — Progress Notes (Signed)
Patient attended the evening wrap up group on the 500 hallway.

## 2016-12-22 NOTE — Progress Notes (Signed)
Report received from previous RN.  Pt is resting in bed with eyes closed.  Respirations even and unlabored.  No distress noted.  Will continue to monitor and assess.

## 2016-12-22 NOTE — BHH Group Notes (Signed)
[facility] Group Notes:  (Clinical Social Work) - 500 hall  12/22/2016  11:00AM-12:00PM  Summary of Progress/Problems:  The main focus of today's process group was to listen to a variety of genres of music and to identify that different types of music provoke different responses.  The patient then was able to identify personally what was soothing for them, as well as energizing, as well as how patient can personally use this knowledge in sleep habits, with depression, and with other symptoms.  The patient expressed at the beginning of group the overall feeling of "good."  She sang along to a lot of the music, said it made her relax and calmed her down.  She smiled quite a bit throughout group.  Type of Therapy:  Music Therapy   Participation Level:  Active  Participation Quality:  Attentive and Sharing  Affect:  Blunted  Cognitive:  Oriented  Insight:  Engaged  Engagement in Therapy:  Engaged  Modes of Intervention:   Activity, Exploration  Selmer Dominion, LCSW 12/22/2016

## 2016-12-22 NOTE — Progress Notes (Signed)
[facility] MD Progress Note  12/22/2016 3:33 PM Andrea [name]  MRN:  [id number] Subjective:   36 yo Caucasian female, single, lives alone emoployed. Background history of Bipolar Disorder. Recently discharged from inpatient facility. Reports command auditory hallucination. Had responded to low dose Abilify. Command to hurt self has been worsening. Hears a female voice. Has been hearing this voice for over a year now. Does not know the identity of this person.  Chart reviewed today. Patient discussed at team.  Staff reports that she has been pleasant. She has been participating appropriately at unit groups and activities. No behavioral issues. She slept well last night. She has been tolerating her medication well.   Seen today. Says she has not heard the voice today. She is a bit sedated from her medication. No internal restlessness. Mood is still down a bit. No suicidal thoughts. Hopes the voice would not come back. Hopes to be back to work soon.   Principal Problem: Bipolar I disorder, most recent episode depressed ([place]) Diagnosis:   Patient Active Problem List   Diagnosis Date Noted  . Bipolar I disorder, most recent episode depressed ([place]) [F31.30] 12/20/2016  . Bipolar 1 disorder, mixed, severe ([place]) [F31.63] 12/20/2016  . Type 2 diabetes mellitus with hyperglycemia ([place]) [E11.65] 09/18/2016  . HTN (hypertension) [I10] 09/18/2016  . Seizures ([place]) [R56.9] 06/04/2016  . Pelvic pain in female [R10.2] 11/14/2015  . Acute pyelonephritis [N10] 01/16/2013  . Migraines [G43.909] 10/30/2012  . Morbid obesity ([place]) [E66.01] 10/30/2012  . Depression with anxiety [F41.8] 10/30/2012  . Kidney stones [N20.0]    Total Time spent with patient: 20 minutes  Past Psychiatric History: As in H&P  Past Medical History:  Past Medical History:  Diagnosis Date  . Anxiety   . Bipolar 1 disorder ([place])   . Complication of anesthesia    hard time waking up   . Depression   . GERD (gastroesophageal reflux  disease)    no meds  . Kidney stones   . Low back pain   . Migraines   . PCOS (polycystic ovarian syndrome)   . PONV (postoperative nausea and vomiting)   . Schizophrenia ([place])   . Shortness of breath dyspnea    with bronchitis    Past Surgical History:  Procedure Laterality Date  . ABDOMINAL HYSTERECTOMY N/A 11/14/2015   Procedure: HYSTERECTOMY ABDOMINAL;  Surgeon: Olga Millers, MD;  Location: [facility];  Service: Gynecology;  Laterality: N/A;  . CHOLECYSTECTOMY    . INTRAUTERINE DEVICE (IUD) INSERTION  06/14/2014   [facility]  . LYMPH NODES REMOVED    . ovarian cyst removed    . SALPINGOOPHORECTOMY Bilateral 11/14/2015   Procedure: SALPINGO OOPHORECTOMY;  Surgeon: Olga Millers, MD;  Location: [facility];  Service: Gynecology;  Laterality: Bilateral;   Family History: History reviewed. No pertinent family history. Family Psychiatric  History: As in H&P Social History:  History  Alcohol Use No     History  Drug Use No    Social History   Social History  . Marital status: Divorced    Spouse name: N/A  . Number of children: N/A  . Years of education: N/A   Social History Main Topics  . Smoking status: Never Smoker  . Smokeless tobacco: Never Used  . Alcohol use No  . Drug use: No  . Sexual activity: No   Other Topics Concern  . None   Social History Narrative  . None   Additional Social History:  Sleep: Good  Appetite:  Good  Current Medications: Current Facility-Administered Medications  Medication Dose Route Frequency Provider Last Rate Last Dose  . acetaminophen (TYLENOL) tablet 650 mg  650 mg Oral Q6H PRN Ethelene Hal, NP   650 mg at 12/20/16 1805  . albuterol (PROVENTIL HFA;VENTOLIN HFA) 108 (90 Base) MCG/ACT inhaler 2 puff  2 puff Inhalation Q4H PRN Ethelene Hal, NP      . alum & mag hydroxide-simeth (MAALOX/MYLANTA) 200-200-20 MG/5ML suspension 30 mL  30 mL Oral Q4H PRN Ethelene Hal, NP   30 mL at 12/21/16  1257  . ARIPiprazole (ABILIFY) tablet 15 mg  15 mg Oral Rudi Coco, MD   15 mg at 12/22/16 0615  . diphenhydrAMINE (BENADRYL) capsule 50 mg  50 mg Oral Once PRN Ethelene Hal, NP       Or  . diphenhydrAMINE (BENADRYL) injection 50 mg  50 mg Intramuscular Once PRN Ethelene Hal, NP      . FLUoxetine (PROZAC) capsule 40 mg  40 mg Oral Daily Ethelene Hal, NP   40 mg at 12/22/16 9326  . haloperidol (HALDOL) tablet 10 mg  10 mg Oral Once PRN Ethelene Hal, NP       Or  . haloperidol lactate (HALDOL) injection 10 mg  10 mg Intramuscular Once PRN Ethelene Hal, NP      . hydrochlorothiazide (HYDRODIURIL) tablet 25 mg  25 mg Oral Daily Ethelene Hal, NP   25 mg at 12/22/16 7124  . hydrOXYzine (ATARAX/VISTARIL) tablet 25 mg  25 mg Oral Q6H PRN Ethelene Hal, NP   25 mg at 12/22/16 1311  . ibuprofen (ADVIL,MOTRIN) tablet 600 mg  600 mg Oral Q8H PRN Ethelene Hal, NP   600 mg at 12/22/16 0450  . insulin aspart (novoLOG) injection 0-15 Units  0-15 Units Subcutaneous TID WC Ethelene Hal, NP      . lisinopril (PRINIVIL,ZESTRIL) tablet 5 mg  5 mg Oral Daily Ethelene Hal, NP   5 mg at 12/22/16 5809  . LORazepam (ATIVAN) tablet 2 mg  2 mg Oral Once PRN Ethelene Hal, NP       Or  . LORazepam (ATIVAN) injection 2 mg  2 mg Intramuscular Once PRN Ethelene Hal, NP      . magnesium hydroxide (MILK OF MAGNESIA) suspension 30 mL  30 mL Oral Daily PRN Ethelene Hal, NP      . topiramate (TOPAMAX) tablet 100 mg  100 mg Oral BID Ethelene Hal, NP   100 mg at 12/22/16 9833  . traZODone (DESYREL) tablet 100 mg  100 mg Oral QHS PRN Ethelene Hal, NP   100 mg at 12/20/16 2104    Lab Results:  Results for orders placed or performed during the hospital encounter of 12/20/16 (from the past 48 hour(s))  Glucose, capillary     Status: Abnormal   Collection Time: 12/20/16  9:23 PM  Result  Value Ref Range   Glucose-Capillary 124 (H) 65 - 99 mg/dL  Glucose, capillary     Status: Abnormal   Collection Time: 12/21/16  5:59 AM  Result Value Ref Range   Glucose-Capillary 105 (H) 65 - 99 mg/dL  Glucose, capillary     Status: Abnormal   Collection Time: 12/21/16 11:44 AM  Result Value Ref Range   Glucose-Capillary 111 (H) 65 - 99 mg/dL  Glucose, capillary     Status: Abnormal   Collection  Time: 12/21/16  5:00 PM  Result Value Ref Range   Glucose-Capillary 114 (H) 65 - 99 mg/dL  Glucose, capillary     Status: Abnormal   Collection Time: 12/21/16  8:57 PM  Result Value Ref Range   Glucose-Capillary 106 (H) 65 - 99 mg/dL  Glucose, capillary     Status: Abnormal   Collection Time: 12/22/16  6:11 AM  Result Value Ref Range   Glucose-Capillary 113 (H) 65 - 99 mg/dL   Comment 1 Notify RN    Comment 2 Document in Chart   Glucose, capillary     Status: Abnormal   Collection Time: 12/22/16 12:05 PM  Result Value Ref Range   Glucose-Capillary 114 (H) 65 - 99 mg/dL   Comment 1 Notify RN    Comment 2 Document in Chart     Blood Alcohol level:  Lab Results  Component Value Date   ETH <5 30/16/0109    Metabolic Disorder Labs: Lab Results  Component Value Date   HGBA1C 7.2 09/18/2016   MPG 120 (H) 05/16/2014   MPG 120 (H) 02/22/2014   No results found for: PROLACTIN Lab Results  Component Value Date   CHOL 144 02/22/2014   TRIG 108 02/22/2014   HDL 29 (L) 02/22/2014   CHOLHDL 5.0 02/22/2014   VLDL 22 02/22/2014   LDLCALC 93 02/22/2014    Physical Findings: AIMS: Facial and Oral Movements Muscles of Facial Expression: None, normal Lips and Perioral Area: None, normal Jaw: None, normal Tongue: None, normal,Extremity Movements Upper (arms, wrists, hands, fingers): None, normal Lower (legs, knees, ankles, toes): None, normal, Trunk Movements Neck, shoulders, hips: None, normal, Overall Severity Severity of abnormal movements (highest score from questions above):  None, normal Incapacitation due to abnormal movements: None, normal Patient's awareness of abnormal movements (rate only patient's report): No Awareness, Dental Status Current problems with teeth and/or dentures?: No Does patient usually wear dentures?: No  CIWA:    COWS:     Musculoskeletal: Strength & Muscle Tone: within normal limits Gait & Station: normal Patient leans: N/A  Psychiatric Specialty Exam: Physical Exam  ROS  Blood pressure 103/66, pulse 80, temperature 97.6 F (36.4 C), temperature source Oral, resp. rate 18, height 6' (1.829 m), weight 125.2 kg (276 lb).Body mass index is 37.43 kg/m.  General Appearance: Pleasant, neatly dressed. Calm and cooperative. Appropriate behavior.   Eye Contact:  Good  Speech:  Clear and Coherent and Normal Rate  Volume:  Normal  Mood:  Feels better  Affect:  Restricted but appropriate.   Thought Process:  Linear  Orientation:  Full (Time, Place, and Person)  Thought Content:  Future oriented. No thoughts of violence.   Suicidal Thoughts:  No  Homicidal Thoughts:  No  Memory:  Immediate;   Good Recent;   Good Remote;   Good  Judgement:  Fair  Insight:  Good  Psychomotor Activity:  Normal  Concentration:  Concentration: Good and Attention Span: Good  Recall:  Good  Fund of Knowledge:  Good  Language:  Good  Akathisia:  Negative  Handed:    AIMS (if indicated):     Assets:  Communication Skills Desire for Improvement Housing Intimacy Physical Health Resilience Transportation Vocational/Educational  ADL's:  Intact  Cognition:  WNL  Sleep:  Number of Hours: 6     Treatment Plan Summary: Patient is responding well to recent medication adjustment. Seems like hallucination is resolving. Slight sedation from her medication. We would evaluate her further. If sedation continues, we would  move Abilify to PM.  Psychiatric: Schizoaffective Disorder Bipolar Type  Medical: HTN DM Obesity Seizure Disorder Migraine    Psychosocial:   PLAN: 1. Continue medications at current dose 2. Encourage unit groups and activities 3. Monitor mood, behavior and interaction with peers 4. SW would coordinate aftercare.    Artist Beach, MD 12/22/2016, 3:33 PM

## 2016-12-22 NOTE — Progress Notes (Addendum)
Andrea [name] is seen OOB UAL on the 300 hall today...she tolerates this fairly well. A She completed her daily assessment and on this she wrote she deneid SI and she rated her depression, hopelessness and anxiety " 10/10.10", respectively. R Sasfety in place and therpaeutic relationship forged.nShe is medicated today prn for her c/o anxiety ( she received vistaril IM per MD order.) Safety in palce

## 2016-12-23 LAB — GLUCOSE, CAPILLARY
GLUCOSE-CAPILLARY: 112 mg/dL — AB (ref 65–99)
GLUCOSE-CAPILLARY: 126 mg/dL — AB (ref 65–99)
GLUCOSE-CAPILLARY: 131 mg/dL — AB (ref 65–99)
GLUCOSE-CAPILLARY: 158 mg/dL — AB (ref 65–99)
Glucose-Capillary: 104 mg/dL — ABNORMAL HIGH (ref 65–99)

## 2016-12-23 MED ORDER — ARIPIPRAZOLE 15 MG PO TABS
15.0000 mg | ORAL_TABLET | Freq: Every day | ORAL | Status: DC
  Administered 2016-12-23: 15 mg via ORAL
  Filled 2016-12-23: qty 7
  Filled 2016-12-23 (×2): qty 1

## 2016-12-23 NOTE — BHH Group Notes (Signed)
[facility] LCSW Group Therapy  12/23/2016 11:21 AM  Type of Therapy:  Group Therapy  Participation Level:  Did Not Attend-pt programming on 500. Did not attend.   Summary of Progress/Problems: Today's Topic: Overcoming Obstacles. Patients identified one short term goal and potential obstacles in reaching this goal. Patients processed barriers involved in overcoming these obstacles. Patients identified steps necessary for overcoming these obstacles and explored motivation (internal and external) for facing these difficulties head on.   Diane Hanel N Smart LCSW 12/23/2016, 11:21 AM

## 2016-12-23 NOTE — Plan of Care (Signed)
Problem: Safety: Goal: Ability to remain free from injury will improve Outcome: Progressing Patient remains safe on the unit at this time. Patient is on q15 minute safety checks and low fall risk precautions.  Problem: Medication: Goal: Compliance with prescribed medication regimen will improve Outcome: Progressing Patient is taking medications as prescribed. Patient verbalizes understanding of medications.

## 2016-12-23 NOTE — Progress Notes (Signed)
Andrea [name] had been up and visible in milieu this evening, did attend and participate in evening group activity. She spoke about her day and spoke about how she was feeling a little better and reports that the voices she hears were not really bothering her today. She was able to receive bedtime medications without incident and did not verbalize any complaints of pain. A. Support and encouragement provided. R. Safety maintained, will continue to monitor.

## 2016-12-23 NOTE — Progress Notes (Signed)
Nursing Progress Note [id number]  D) Patient presents with flat affect and forwards little with Probation officer but is cooperative. Patient requests medications including ibuprofen for a headache. Patient CBG elevated this evening but patient denies any symptoms of hyperglycemia. Patient denies SI/HI but endorses auditory hallucinations. Patient contracts for safety on the unit. Patient reports sleeping well with current regimen. Patient did attend group.  A) Emotional support given. 1:1 interaction and active listening provided. Patient medicated as prescribed. Medications and plan of care reviewed with patient. Patient verbalized understanding without further questions. Snacks and fluids provided. Opportunities for questions or concerns presented to patient. Patient encouraged to continue to work on treatment goals. Labs, vital signs and patient behavior monitored throughout shift. Patient safety maintained with q15 min safety checks. Low fall risk precautions in place and reviewed with patient; patient verbalized understanding.  R) Patient receptive to interaction with nurse. Patient remains safe on the unit at this time. Patient denies any adverse medication reactions at this time. Patient is resting in bed without complaints. Will continue to monitor.

## 2016-12-23 NOTE — Plan of Care (Signed)
Problem: Health Behavior/Discharge Planning: Goal: Compliance with treatment plan for underlying cause of condition will improve Outcome: Progressing Pt is taking medications and programming on 500 hall.   Problem: Physical Regulation: Goal: Ability to maintain clinical measurements within normal limits will improve Outcome: Adequate for Discharge Vitals are WNL. CBGs have been less than 120 with one exception (158) on 7/8.  Problem: Safety: Goal: Periods of time without injury will increase Outcome: Progressing Pt remains safe with rounding and 15-minute checks in place.

## 2016-12-23 NOTE — Progress Notes (Signed)
Recreation Therapy Notes  Date: 12/23/16 Time: 0930 Location: 300 Hall Dayroom  Group Topic: Stress Management  Goal Area(s) Addresses:  Patient will verbalize importance of using healthy stress management.  Patient will identify positive emotions associated with healthy stress management.   Behavioral Response: Engaged  Intervention: Stress Management  Activity :  Guided Imagery.  LRT introduced the stress management technique of guided imagery.  LRT read a script that allowed patients to take a mental vacation through a summer meadow.  Patients were to follow along as script was read to engage in the activity.  Education:  Stress Management, Discharge Planning.   Education Outcome: Acknowledges edcuation/In group clarification offered/Needs additional education  Clinical Observations/Feedback: Pt attended group.   Victorino Sparrow, LRT/CTRS         Ria Comment, Drucella Karbowski A 12/23/2016 1:17 PM

## 2016-12-23 NOTE — Progress Notes (Signed)
D: Andrea [name] denied SI, HI, and AVH. She's been calm, pleasant, and cooperative today. She's shown no evidence of responding to internal stimuli. She requested to take Abilify at night instead of the morning because she thinks it makes her drowsy. She's been participating in groups. On her self inventory form, she reported fair sleep, good appetite, low energy level, and good concentration level. She rated her depression 5/10, hopelessness 5/10, and anxiety 10/10.  A: Meds given as ordered. Q15 safety checks maintained. Support/encouragement offered.  R: Pt remains free from harm and continues with treatment. Will continue to monitor for needs/safety.

## 2016-12-23 NOTE — Progress Notes (Signed)
[facility] MD Progress Note  12/23/2016 11:01 AM Andrea [name]  MRN:  [id number] Subjective:   36 yo Caucasian female, single, lives alone emoployed. Background history of Bipolar Disorder. Recently discharged from inpatient facility. Reports command auditory hallucination. Had responded to low dose Abilify. Command to hurt self has been worsening. Hears a female voice. Has been hearing this voice for over a year now. Does not know the identity of this person.  Chart reviewed today. Patient discussed at team.  Staff reports that she has been appropriate on the unit. She has not been observed to be internally stimulated. She has not voiced any perceptual abnormalities. She has been interacting well with peers. Attends unit groups. No behavioral issues. She has not required any PRN's lately.   Seen today. Has not heard the voice since her medication was adjusted. Skipped her Abilify this morning as she wants to take it at night instead. No hallucination in any other modality. No paranoia or persecution. No delusional atmosphere. Feels good and able to enjoy the day. She was out at the courtyard this morning. Patient plans to get back to work locally. Long term plan is to relocate back to [place] where she ash family support. She has been living here for over fifteen years.  No thoughts of violence. No thoughts of suicide. Feels okay with going home tomorrow.   Principal Problem: Schizoaffective Disorder Diagnosis:   Patient Active Problem List   Diagnosis Date Noted  . Bipolar I disorder, most recent episode depressed ([place]) [F31.30] 12/20/2016  . Bipolar 1 disorder, mixed, severe ([place]) [F31.63] 12/20/2016  . Type 2 diabetes mellitus with hyperglycemia ([place]) [E11.65] 09/18/2016  . HTN (hypertension) [I10] 09/18/2016  . Seizures ([place]) [R56.9] 06/04/2016  . Pelvic pain in female [R10.2] 11/14/2015  . Acute pyelonephritis [N10] 01/16/2013  . Migraines [G43.909] 10/30/2012  . Morbid obesity ([place])  [E66.01] 10/30/2012  . Depression with anxiety [F41.8] 10/30/2012  . Kidney stones [N20.0]    Total Time spent with patient: 20 minutes  Past Psychiatric History: As in H&P  Past Medical History:  Past Medical History:  Diagnosis Date  . Anxiety   . Bipolar 1 disorder ([place])   . Complication of anesthesia    hard time waking up   . Depression   . GERD (gastroesophageal reflux disease)    no meds  . Kidney stones   . Low back pain   . Migraines   . PCOS (polycystic ovarian syndrome)   . PONV (postoperative nausea and vomiting)   . Schizophrenia ([place])   . Shortness of breath dyspnea    with bronchitis    Past Surgical History:  Procedure Laterality Date  . ABDOMINAL HYSTERECTOMY N/A 11/14/2015   Procedure: HYSTERECTOMY ABDOMINAL;  Surgeon: Olga Millers, MD;  Location: [facility];  Service: Gynecology;  Laterality: N/A;  . CHOLECYSTECTOMY    . INTRAUTERINE DEVICE (IUD) INSERTION  06/14/2014   [facility]  . LYMPH NODES REMOVED    . ovarian cyst removed    . SALPINGOOPHORECTOMY Bilateral 11/14/2015   Procedure: SALPINGO OOPHORECTOMY;  Surgeon: Olga Millers, MD;  Location: [facility];  Service: Gynecology;  Laterality: Bilateral;   Family History: History reviewed. No pertinent family history. Family Psychiatric  History: As in H&P Social History:  History  Alcohol Use No     History  Drug Use No    Social History   Social History  . Marital status: Divorced    Spouse name: N/A  . Number  of children: N/A  . Years of education: N/A   Social History Main Topics  . Smoking status: Never Smoker  . Smokeless tobacco: Never Used  . Alcohol use No  . Drug use: No  . Sexual activity: No   Other Topics Concern  . None   Social History Narrative  . None   Additional Social History:        Sleep: Good  Appetite:  Good  Current Medications: Current Facility-Administered Medications  Medication Dose Route Frequency Provider Last Rate Last Dose  .  acetaminophen (TYLENOL) tablet 650 mg  650 mg Oral Q6H PRN Ethelene Hal, NP   650 mg at 12/23/16 0325  . albuterol (PROVENTIL HFA;VENTOLIN HFA) 108 (90 Base) MCG/ACT inhaler 2 puff  2 puff Inhalation Q4H PRN Ethelene Hal, NP   2 puff at 12/22/16 2112  . alum & mag hydroxide-simeth (MAALOX/MYLANTA) 200-200-20 MG/5ML suspension 30 mL  30 mL Oral Q4H PRN Ethelene Hal, NP   30 mL at 12/21/16 1257  . ARIPiprazole (ABILIFY) tablet 15 mg  15 mg Oral Rudi Coco, MD   15 mg at 12/22/16 0615  . diphenhydrAMINE (BENADRYL) capsule 50 mg  50 mg Oral Once PRN Ethelene Hal, NP       Or  . diphenhydrAMINE (BENADRYL) injection 50 mg  50 mg Intramuscular Once PRN Ethelene Hal, NP      . FLUoxetine (PROZAC) capsule 40 mg  40 mg Oral Daily Ethelene Hal, NP   40 mg at 12/23/16 0752  . fluticasone (FLOVENT HFA) 110 MCG/ACT inhaler 2 puff  2 puff Inhalation BID Derrill Center, NP   2 puff at 12/23/16 0800  . haloperidol (HALDOL) tablet 10 mg  10 mg Oral Once PRN Ethelene Hal, NP       Or  . haloperidol lactate (HALDOL) injection 10 mg  10 mg Intramuscular Once PRN Ethelene Hal, NP      . hydrochlorothiazide (HYDRODIURIL) tablet 25 mg  25 mg Oral Daily Ethelene Hal, NP   25 mg at 12/23/16 9628  . hydrOXYzine (ATARAX/VISTARIL) tablet 25 mg  25 mg Oral Q6H PRN Ethelene Hal, NP   25 mg at 12/22/16 1845  . ibuprofen (ADVIL,MOTRIN) tablet 600 mg  600 mg Oral Q8H PRN Ethelene Hal, NP   600 mg at 12/23/16 3662  . insulin aspart (novoLOG) injection 0-15 Units  0-15 Units Subcutaneous TID WC Ethelene Hal, NP      . lisinopril (PRINIVIL,ZESTRIL) tablet 5 mg  5 mg Oral Daily Ethelene Hal, NP   5 mg at 12/23/16 9476  . LORazepam (ATIVAN) tablet 2 mg  2 mg Oral Once PRN Ethelene Hal, NP       Or  . LORazepam (ATIVAN) injection 2 mg  2 mg Intramuscular Once PRN Ethelene Hal, NP      .  magnesium hydroxide (MILK OF MAGNESIA) suspension 30 mL  30 mL Oral Daily PRN Ethelene Hal, NP      . topiramate (TOPAMAX) tablet 100 mg  100 mg Oral BID Ethelene Hal, NP   100 mg at 12/23/16 5465  . traZODone (DESYREL) tablet 100 mg  100 mg Oral QHS PRN Ethelene Hal, NP   100 mg at 12/22/16 2111    Lab Results:  Results for orders placed or performed during the hospital encounter of 12/20/16 (from the past 48 hour(s))  Glucose, capillary  Status: Abnormal   Collection Time: 12/21/16 11:44 AM  Result Value Ref Range   Glucose-Capillary 111 (H) 65 - 99 mg/dL  Glucose, capillary     Status: Abnormal   Collection Time: 12/21/16  5:00 PM  Result Value Ref Range   Glucose-Capillary 114 (H) 65 - 99 mg/dL  Glucose, capillary     Status: Abnormal   Collection Time: 12/21/16  8:57 PM  Result Value Ref Range   Glucose-Capillary 106 (H) 65 - 99 mg/dL  Glucose, capillary     Status: Abnormal   Collection Time: 12/22/16  6:11 AM  Result Value Ref Range   Glucose-Capillary 113 (H) 65 - 99 mg/dL   Comment 1 Notify RN    Comment 2 Document in Chart   Glucose, capillary     Status: Abnormal   Collection Time: 12/22/16 12:05 PM  Result Value Ref Range   Glucose-Capillary 114 (H) 65 - 99 mg/dL   Comment 1 Notify RN    Comment 2 Document in Chart   Glucose, capillary     Status: Abnormal   Collection Time: 12/22/16  4:58 PM  Result Value Ref Range   Glucose-Capillary 112 (H) 65 - 99 mg/dL   Comment 1 Notify RN    Comment 2 Document in Chart   Glucose, capillary     Status: Abnormal   Collection Time: 12/22/16  8:55 PM  Result Value Ref Range   Glucose-Capillary 158 (H) 65 - 99 mg/dL   Comment 1 Notify RN    Comment 2 Document in Chart   Glucose, capillary     Status: Abnormal   Collection Time: 12/23/16  6:10 AM  Result Value Ref Range   Glucose-Capillary 104 (H) 65 - 99 mg/dL   Comment 1 Notify RN    Comment 2 Document in Chart     Blood Alcohol level:   Lab Results  Component Value Date   ETH <5 93/71/6967    Metabolic Disorder Labs: Lab Results  Component Value Date   HGBA1C 7.2 09/18/2016   MPG 120 (H) 05/16/2014   MPG 120 (H) 02/22/2014   No results found for: PROLACTIN Lab Results  Component Value Date   CHOL 144 02/22/2014   TRIG 108 02/22/2014   HDL 29 (L) 02/22/2014   CHOLHDL 5.0 02/22/2014   VLDL 22 02/22/2014   LDLCALC 93 02/22/2014    Physical Findings: AIMS: Facial and Oral Movements Muscles of Facial Expression: None, normal Lips and Perioral Area: None, normal Jaw: None, normal Tongue: None, normal,Extremity Movements Upper (arms, wrists, hands, fingers): None, normal Lower (legs, knees, ankles, toes): None, normal, Trunk Movements Neck, shoulders, hips: None, normal, Overall Severity Severity of abnormal movements (highest score from questions above): None, normal Incapacitation due to abnormal movements: None, normal Patient's awareness of abnormal movements (rate only patient's report): No Awareness, Dental Status Current problems with teeth and/or dentures?: No Does patient usually wear dentures?: No  CIWA:    COWS:     Musculoskeletal: Strength & Muscle Tone: within normal limits Gait & Station: normal Patient leans: N/A  Psychiatric Specialty Exam: Physical Exam  Constitutional: She is oriented to person, place, and time. She appears well-developed and well-nourished.  HENT:  Head: Normocephalic and atraumatic.  Eyes: Pupils are equal, round, and reactive to light.  Neck: Normal range of motion. Neck supple.  Cardiovascular: Normal rate.   Respiratory: Effort normal.  Musculoskeletal: Normal range of motion.  Neurological: She is alert and oriented to person, place, and time.  Skin: Skin is warm and dry.  Psychiatric:  As above.     ROS  Blood pressure 125/82, pulse 77, temperature 97.7 F (36.5 C), temperature source Oral, resp. rate 20, height 6' (1.829 m), weight 125.2 kg (276  lb).Body mass index is 37.43 kg/m.  General Appearance: Casually dressed, engages well, pleasant and does not appear internally distracted. No evidence of EPS  Eye Contact:  Good  Speech:  Spontaneous, normal rate tone and volume.   Volume:  Normal  Mood:  Euthymic  Affect:  Full range and appropriate.   Thought Process:  Linear  Orientation:  Full (Time, Place, and Person)  Thought Content:  Future oriented. No delusional theme. No preoccupation with violent thoughts. No negative ruminations. No obsession.  No hallucination in any modality.   Suicidal Thoughts:  No  Homicidal Thoughts:  No  Memory:  Immediate;   Good Recent;   Good Remote;   Good  Judgement:  Good  Insight:  Good  Psychomotor Activity:  Normal  Concentration:  Concentration: Good and Attention Span: Good  Recall:  Good  Fund of Knowledge:  Good  Language:  Good  Akathisia:  Negative  Handed:    AIMS (if indicated):     Assets:  Communication Skills Desire for Improvement Housing Intimacy Physical Health Resilience Transportation Vocational/Educational  ADL's:  Intact  Cognition:  WNL  Sleep:  Number of Hours: 6     Treatment Plan Summary: Psychosis seems to have responded well to recent adjustments. She is now taking Abilify at bedtime as somnolence is an issue. Tonight would be the first PM dose. We plan to evaluate her overnight. Hopeful discharge tomorrow if she maintains stability.   Psychiatric: Schizoaffective Disorder Bipolar Type  Medical: HTN DM Obesity Seizure Disorder Migraine   Psychosocial:   PLAN: 1. Change Abilify to PM 2. Continue to monitor mood, behavior and interaction with peers 3. Home tomorrow if she maintains stability   Artist [place], MD 12/23/2016, 11:01 AMPatient ID: [name], female   DOB: Dec 31, 1980, 35 y.o.   MRN: [id number]

## 2016-12-23 NOTE — Tx Team (Signed)
Interdisciplinary Treatment and Diagnostic Plan Update  12/23/2016 Time of Session:0930AM Andrea [name] MRN: [id number]  Principal Diagnosis: Bipolar I disorder, most recent episode depressed ([place])  Secondary Diagnoses: Principal Problem:   Bipolar I disorder, most recent episode depressed ([place]) Active Problems:   Type 2 diabetes mellitus with hyperglycemia ([place])   Bipolar 1 disorder, mixed, severe ([place])   Current Medications:  Current Facility-Administered Medications  Medication Dose Route Frequency Provider Last Rate Last Dose  . acetaminophen (TYLENOL) tablet 650 mg  650 mg Oral Q6H PRN Ethelene Hal, NP   650 mg at 12/23/16 0325  . albuterol (PROVENTIL HFA;VENTOLIN HFA) 108 (90 Base) MCG/ACT inhaler 2 puff  2 puff Inhalation Q4H PRN Ethelene Hal, NP   2 puff at 12/22/16 2112  . alum & mag hydroxide-simeth (MAALOX/MYLANTA) 200-200-20 MG/5ML suspension 30 mL  30 mL Oral Q4H PRN Ethelene Hal, NP   30 mL at 12/21/16 1257  . ARIPiprazole (ABILIFY) tablet 15 mg  15 mg Oral Rudi Coco, MD   15 mg at 12/22/16 0615  . diphenhydrAMINE (BENADRYL) capsule 50 mg  50 mg Oral Once PRN Ethelene Hal, NP       Or  . diphenhydrAMINE (BENADRYL) injection 50 mg  50 mg Intramuscular Once PRN Ethelene Hal, NP      . FLUoxetine (PROZAC) capsule 40 mg  40 mg Oral Daily Ethelene Hal, NP   40 mg at 12/23/16 0752  . fluticasone (FLOVENT HFA) 110 MCG/ACT inhaler 2 puff  2 puff Inhalation BID Derrill Center, NP   2 puff at 12/23/16 0800  . haloperidol (HALDOL) tablet 10 mg  10 mg Oral Once PRN Ethelene Hal, NP       Or  . haloperidol lactate (HALDOL) injection 10 mg  10 mg Intramuscular Once PRN Ethelene Hal, NP      . hydrochlorothiazide (HYDRODIURIL) tablet 25 mg  25 mg Oral Daily Ethelene Hal, NP   25 mg at 12/23/16 9622  . hydrOXYzine (ATARAX/VISTARIL) tablet 25 mg  25 mg Oral Q6H PRN Ethelene Hal,  NP   25 mg at 12/22/16 1845  . ibuprofen (ADVIL,MOTRIN) tablet 600 mg  600 mg Oral Q8H PRN Ethelene Hal, NP   600 mg at 12/23/16 2979  . insulin aspart (novoLOG) injection 0-15 Units  0-15 Units Subcutaneous TID WC Ethelene Hal, NP      . lisinopril (PRINIVIL,ZESTRIL) tablet 5 mg  5 mg Oral Daily Ethelene Hal, NP   5 mg at 12/23/16 8921  . LORazepam (ATIVAN) tablet 2 mg  2 mg Oral Once PRN Ethelene Hal, NP       Or  . LORazepam (ATIVAN) injection 2 mg  2 mg Intramuscular Once PRN Ethelene Hal, NP      . magnesium hydroxide (MILK OF MAGNESIA) suspension 30 mL  30 mL Oral Daily PRN Ethelene Hal, NP      . topiramate (TOPAMAX) tablet 100 mg  100 mg Oral BID Ethelene Hal, NP   100 mg at 12/23/16 1941  . traZODone (DESYREL) tablet 100 mg  100 mg Oral QHS PRN Ethelene Hal, NP   100 mg at 12/22/16 2111   PTA Medications: Prescriptions Prior to Admission  Medication Sig Dispense Refill Last Dose  . albuterol (PROVENTIL HFA;VENTOLIN HFA) 108 (90 Base) MCG/ACT inhaler Inhale 2 puffs into the lungs every 4 (four) hours as needed for wheezing. (Patient taking  differently: Inhale 2 puffs into the lungs every 8 (eight) hours as needed for wheezing or shortness of breath. ) 1 Inhaler 2 12/16/2016 at Unknown time  . ARIPiprazole (ABILIFY) 2 MG tablet Take 1 tablet (2 mg total) by mouth daily. (Patient not taking: Reported on 12/20/2016) 30 tablet 0 Not Taking at Unknown time  . ARIPiprazole (ABILIFY) 5 MG tablet Take 5 mg by mouth every morning.   12/20/2016 at Unknown time  . cephALEXin (KEFLEX) 500 MG capsule Take 1 tablet by mouth three times a day for 7 days. (Patient not taking: Reported on 12/20/2016) 21 capsule 0 Completed Course at Unknown time  . clonazePAM (KLONOPIN) 0.5 MG tablet Take 1 tablet (0.5 mg total) by mouth 2 (two) times daily as needed for anxiety. 60 tablet 0 12/18/2016 at Unknown time  . cyclobenzaprine (FLEXERIL) 10 MG tablet  TAKE ONE TABLET BY MOUTH AT BEDTIME AS NEEDED FOR MUSCLE SPASM 30 tablet 4 12/17/2016 at Unknown time  . FLUoxetine (PROZAC) 40 MG capsule Take 1 capsule (40 mg total) by mouth daily. 30 capsule 1 12/20/2016 at Unknown time  . fluticasone (FLOVENT HFA) 110 MCG/ACT inhaler Inhale 2 puffs into the lungs 2 (two) times daily. 3 Inhaler 3 12/20/2016 at Unknown time  . hydrochlorothiazide (HYDRODIURIL) 25 MG tablet Take 1 tablet (25 mg total) by mouth daily. 30 tablet 4 12/20/2016 at Unknown time  . lisinopril (PRINIVIL,ZESTRIL) 5 MG tablet Take 1 tablet (5 mg total) by mouth daily. 30 tablet 6 12/20/2016 at Unknown time  . oxyCODONE-acetaminophen (PERCOCET) 10-325 MG tablet Take 1 tablet by mouth every 4 (four) hours as needed for pain. (Patient taking differently: Take 0.5 tablets by mouth daily as needed (migraine). ) 20 tablet 0 Past Month at Unknown time  . predniSONE (DELTASONE) 20 MG tablet Take 3 tablets by mouth daily for 5 days (Patient not taking: Reported on 12/20/2016) 15 tablet 0 Not Taking at Unknown time  . tamsulosin (FLOMAX) 0.4 MG CAPS capsule Take 1 capsule (0.4 mg total) by mouth daily. (Patient not taking: Reported on 11/12/2016) 20 capsule 1 Not Taking at Unknown time  . tiZANidine (ZANAFLEX) 4 MG tablet Take 1 tablet (4 mg total) by mouth 3 (three) times daily as needed (Take as needed for migraines.  Do not take frequently.). (Patient taking differently: Take 4 mg by mouth daily as needed (Take as needed for migraines.). ) 30 tablet 2 Past Month at Unknown time  . topiramate (TOPAMAX) 100 MG tablet Take 1 tablet (100 mg total) by mouth 2 (two) times daily. 60 tablet 4 12/20/2016 at Unknown time  . traZODone (DESYREL) 100 MG tablet Take 1 tablet (100 mg total) by mouth at bedtime as needed for sleep. (Patient taking differently: Take 100-200 mg by mouth at bedtime as needed for sleep. ) 30 tablet 4 12/19/2016 at Unknown time    Patient Stressors: Medication change or noncompliance Other: living  situation  Patient Strengths: Ability for insight Average or above average intelligence Capable of independent living General fund of knowledge Motivation for treatment/growth  Treatment Modalities: Medication Management, Group therapy, Case management,  1 to 1 session with clinician, Psychoeducation, Recreational therapy.   Physician Treatment Plan for Primary Diagnosis: Bipolar I disorder, most recent episode depressed ([place]) Long Term Goal(s): Improvement in symptoms so as ready for discharge Improvement in symptoms so as ready for discharge   Short Term Goals: Ability to identify changes in lifestyle to reduce recurrence of condition will improve Ability to verbalize feelings will  improve Ability to identify and develop effective coping behaviors will improve Ability to verbalize feelings will improve Ability to demonstrate self-control will improve Compliance with prescribed medications will improve  Medication Management: Evaluate patient's response, side effects, and tolerance of medication regimen.  Therapeutic Interventions: 1 to 1 sessions, Unit Group sessions and Medication administration.  Evaluation of Outcomes: Progressing  Physician Treatment Plan for Secondary Diagnosis: Principal Problem:   Bipolar I disorder, most recent episode depressed ([place]) Active Problems:   Type 2 diabetes mellitus with hyperglycemia ([place])   Bipolar 1 disorder, mixed, severe ([place])  Long Term Goal(s): Improvement in symptoms so as ready for discharge Improvement in symptoms so as ready for discharge   Short Term Goals: Ability to identify changes in lifestyle to reduce recurrence of condition will improve Ability to verbalize feelings will improve Ability to identify and develop effective coping behaviors will improve Ability to verbalize feelings will improve Ability to demonstrate self-control will improve Compliance with prescribed medications will improve     Medication  Management: Evaluate patient's response, side effects, and tolerance of medication regimen.  Therapeutic Interventions: 1 to 1 sessions, Unit Group sessions and Medication administration.  Evaluation of Outcomes: Progressing   RN Treatment Plan for Primary Diagnosis: Bipolar I disorder, most recent episode depressed ([place]) Long Term Goal(s): Knowledge of disease and therapeutic regimen to maintain health will improve  Short Term Goals: Ability to remain free from injury will improve, Ability to verbalize feelings will improve and Ability to disclose and discuss suicidal ideas  Medication Management: RN will administer medications as ordered by provider, will assess and evaluate patient's response and provide education to patient for prescribed medication. RN will report any adverse and/or side effects to prescribing provider.  Therapeutic Interventions: 1 on 1 counseling sessions, Psychoeducation, Medication administration, Evaluate responses to treatment, Monitor vital signs and CBGs as ordered, Perform/monitor CIWA, COWS, AIMS and Fall Risk screenings as ordered, Perform wound care treatments as ordered.  Evaluation of Outcomes: Progressing   LCSW Treatment Plan for Primary Diagnosis: Bipolar I disorder, most recent episode depressed ([place]) Long Term Goal(s): Safe transition to appropriate next level of care at discharge, Engage patient in therapeutic group addressing interpersonal concerns.  Short Term Goals: Engage patient in aftercare planning with referrals and resources, Facilitate patient progression through stages of change regarding substance use diagnoses and concerns and Identify triggers associated with mental health/substance abuse issues  Therapeutic Interventions: Assess for all discharge needs, 1 to 1 time with Social worker, Explore available resources and support systems, Assess for adequacy in community support network, Educate family and significant other(s) on suicide  prevention, Complete Psychosocial Assessment, Interpersonal group therapy.  Evaluation of Outcomes: Progressing   Progress in Treatment: Attending groups: Yes. Participating in groups: Yes. Taking medication as prescribed: Yes. Toleration medication: Yes. Family/Significant other contact made: Yes, individual(s) contacted:  pt's mother. Patient understands diagnosis: Yes. Discussing patient identified problems/goals with staff: Yes. Medical problems stabilized or resolved: Yes. Denies suicidal/homicidal ideation: Yes. Issues/concerns per patient self-inventory: No. Other: n/a  New problem(s) identified: No, Describe:  n/a  New Short Term/Long Term Goal(s): elimination of SI and AH, medication management for mood stabilization; development of comprehensive mental wellness/sobriety plan.   Discharge Plan or Barriers: CSW assessing for appropriate referrals. She may move to [place] with her mother at discharge. If not, pt will return to her home and will [facility] at [facility].   Reason for Continuation of Hospitalization: Depression Hallucinations Medication stabilization  Estimated Length of Stay: tentative  discharge on 12/25/16 (WED)  Attendees: Patient: 12/23/2016 8:52 AM  Physician: Dr. Sanjuana Letters MD 12/23/2016 8:52 AM  Nursing: Lottie Mussel RN 12/23/2016 8:52 AM  RN Care Manager: Skipper Cliche CM 12/23/2016 8:52 AM  Social Worker: Maxie Better, LCSW 12/23/2016 8:52 AM  Recreational Therapist: x 12/23/2016 8:52 AM  Other: Lindell Spar NP; Mordecai Maes NP 12/23/2016 8:52 AM  Other:  12/23/2016 8:52 AM  Other: 12/23/2016 8:52 AM    Scribe for Treatment Team: [place], LCSW 12/23/2016 8:52 AM

## 2016-12-24 LAB — GLUCOSE, CAPILLARY
GLUCOSE-CAPILLARY: 99 mg/dL (ref 65–99)
Glucose-Capillary: 106 mg/dL — ABNORMAL HIGH (ref 65–99)

## 2016-12-24 MED ORDER — POTASSIUM CHLORIDE CRYS ER 20 MEQ PO TBCR
40.0000 meq | EXTENDED_RELEASE_TABLET | Freq: Once | ORAL | Status: AC
  Administered 2016-12-24: 40 meq via ORAL
  Filled 2016-12-24: qty 2

## 2016-12-24 MED ORDER — HYDROCHLOROTHIAZIDE 25 MG PO TABS: 25.0000 mg | ORAL_TABLET | Freq: Every day | ORAL | 0 refills | Status: DC

## 2016-12-24 MED ORDER — ARIPIPRAZOLE 15 MG PO TABS: 15.0000 mg | ORAL_TABLET | Freq: Every day | ORAL | 0 refills | Status: DC

## 2016-12-24 MED ORDER — HYDROXYZINE HCL 25 MG PO TABS
25.0000 mg | ORAL_TABLET | Freq: Four times a day (QID) | ORAL | 0 refills | Status: DC | PRN
Start: 2016-12-24 — End: 2020-01-01

## 2016-12-24 MED ORDER — FLUOXETINE HCL 40 MG PO CAPS: 40.0000 mg | ORAL_CAPSULE | Freq: Every day | ORAL | 0 refills | Status: DC

## 2016-12-24 MED ORDER — ALBUTEROL SULFATE HFA 108 (90 BASE) MCG/ACT IN AERS: 2.0000 | INHALATION_SPRAY | RESPIRATORY_TRACT | 2 refills | Status: DC | PRN

## 2016-12-24 MED ORDER — FLUTICASONE PROPIONATE HFA 110 MCG/ACT IN AERO: 2.0000 | INHALATION_SPRAY | Freq: Two times a day (BID) | RESPIRATORY_TRACT | 0 refills | Status: DC

## 2016-12-24 MED ORDER — TOPIRAMATE 100 MG PO TABS: 100.0000 mg | ORAL_TABLET | Freq: Two times a day (BID) | ORAL | 0 refills | Status: DC

## 2016-12-24 MED ORDER — TRAZODONE HCL 100 MG PO TABS: 100.0000 mg | ORAL_TABLET | Freq: Every evening | ORAL | 0 refills | Status: DC | PRN

## 2016-12-24 MED ORDER — LISINOPRIL 5 MG PO TABS: 5.0000 mg | ORAL_TABLET | Freq: Every day | ORAL | 0 refills | Status: DC

## 2016-12-24 NOTE — Tx Team (Signed)
Interdisciplinary Treatment and Diagnostic Plan Update  12/24/2016 Time of Session:0930AM Andrea [name] MRN: [id number]  Principal Diagnosis: Bipolar I disorder, most recent episode depressed ([place])  Secondary Diagnoses: Principal Problem:   Bipolar I disorder, most recent episode depressed ([place]) Active Problems:   Type 2 diabetes mellitus with hyperglycemia ([place])   Bipolar 1 disorder, mixed, severe ([place])   Current Medications:  Current Facility-Administered Medications  Medication Dose Route Frequency Provider Last Rate Last Dose  . acetaminophen (TYLENOL) tablet 650 mg  650 mg Oral Q6H PRN Ethelene Hal, NP   650 mg at 12/23/16 0325  . albuterol (PROVENTIL HFA;VENTOLIN HFA) 108 (90 Base) MCG/ACT inhaler 2 puff  2 puff Inhalation Q4H PRN Ethelene Hal, NP   2 puff at 12/22/16 2112  . alum & mag hydroxide-simeth (MAALOX/MYLANTA) 200-200-20 MG/5ML suspension 30 mL  30 mL Oral Q4H PRN Ethelene Hal, NP   30 mL at 12/23/16 1327  . ARIPiprazole (ABILIFY) tablet 15 mg  15 mg Oral QHS Izediuno, Laruth Bouchard, MD   15 mg at 12/23/16 2105  . diphenhydrAMINE (BENADRYL) capsule 50 mg  50 mg Oral Once PRN Ethelene Hal, NP       Or  . diphenhydrAMINE (BENADRYL) injection 50 mg  50 mg Intramuscular Once PRN Ethelene Hal, NP      . FLUoxetine (PROZAC) capsule 40 mg  40 mg Oral Daily Ethelene Hal, NP   40 mg at 12/24/16 0841  . fluticasone (FLOVENT HFA) 110 MCG/ACT inhaler 2 puff  2 puff Inhalation BID Derrill Center, NP   2 puff at 12/24/16 0841  . haloperidol (HALDOL) tablet 10 mg  10 mg Oral Once PRN Ethelene Hal, NP       Or  . haloperidol lactate (HALDOL) injection 10 mg  10 mg Intramuscular Once PRN Ethelene Hal, NP      . hydrochlorothiazide (HYDRODIURIL) tablet 25 mg  25 mg Oral Daily Ethelene Hal, NP   25 mg at 12/24/16 0841  . hydrOXYzine (ATARAX/VISTARIL) tablet 25 mg  25 mg Oral Q6H PRN Ethelene Hal, NP    25 mg at 12/23/16 1826  . ibuprofen (ADVIL,MOTRIN) tablet 600 mg  600 mg Oral Q8H PRN Ethelene Hal, NP   600 mg at 12/23/16 2105  . insulin aspart (novoLOG) injection 0-15 Units  0-15 Units Subcutaneous TID WC Ethelene Hal, NP   2 Units at 12/23/16 1725  . lisinopril (PRINIVIL,ZESTRIL) tablet 5 mg  5 mg Oral Daily Ethelene Hal, NP   5 mg at 12/24/16 0841  . LORazepam (ATIVAN) tablet 2 mg  2 mg Oral Once PRN Ethelene Hal, NP       Or  . LORazepam (ATIVAN) injection 2 mg  2 mg Intramuscular Once PRN Ethelene Hal, NP      . magnesium hydroxide (MILK OF MAGNESIA) suspension 30 mL  30 mL Oral Daily PRN Ethelene Hal, NP      . potassium chloride SA (K-DUR,KLOR-CON) CR tablet 40 mEq  40 mEq Oral Once Lindell Spar I, NP      . topiramate (TOPAMAX) tablet 100 mg  100 mg Oral BID Ethelene Hal, NP   100 mg at 12/24/16 0841  . traZODone (DESYREL) tablet 100 mg  100 mg Oral QHS PRN Ethelene Hal, NP   100 mg at 12/23/16 2105   PTA Medications: Prescriptions Prior to Admission  Medication Sig Dispense Refill Last Dose  .  ARIPiprazole (ABILIFY) 2 MG tablet Take 1 tablet (2 mg total) by mouth daily. (Patient not taking: Reported on 12/20/2016) 30 tablet 0 Not Taking at Unknown time  . ARIPiprazole (ABILIFY) 5 MG tablet Take 5 mg by mouth every morning.   12/20/2016 at Unknown time  . cephALEXin (KEFLEX) 500 MG capsule Take 1 tablet by mouth three times a day for 7 days. (Patient not taking: Reported on 12/20/2016) 21 capsule 0 Completed Course at Unknown time  . clonazePAM (KLONOPIN) 0.5 MG tablet Take 1 tablet (0.5 mg total) by mouth 2 (two) times daily as needed for anxiety. 60 tablet 0 12/18/2016 at Unknown time  . cyclobenzaprine (FLEXERIL) 10 MG tablet TAKE ONE TABLET BY MOUTH AT BEDTIME AS NEEDED FOR MUSCLE SPASM 30 tablet 4 12/17/2016 at Unknown time  . FLUoxetine (PROZAC) 40 MG capsule Take 1 capsule (40 mg total) by mouth daily. 30 capsule 1  12/20/2016 at Unknown time  . oxyCODONE-acetaminophen (PERCOCET) 10-325 MG tablet Take 1 tablet by mouth every 4 (four) hours as needed for pain. (Patient taking differently: Take 0.5 tablets by mouth daily as needed (migraine). ) 20 tablet 0 Past Month at Unknown time  . predniSONE (DELTASONE) 20 MG tablet Take 3 tablets by mouth daily for 5 days (Patient not taking: Reported on 12/20/2016) 15 tablet 0 Not Taking at Unknown time  . tamsulosin (FLOMAX) 0.4 MG CAPS capsule Take 1 capsule (0.4 mg total) by mouth daily. (Patient not taking: Reported on 11/12/2016) 20 capsule 1 Not Taking at Unknown time  . tiZANidine (ZANAFLEX) 4 MG tablet Take 1 tablet (4 mg total) by mouth 3 (three) times daily as needed (Take as needed for migraines.  Do not take frequently.). (Patient taking differently: Take 4 mg by mouth daily as needed (Take as needed for migraines.). ) 30 tablet 2 Past Month at Unknown time  . topiramate (TOPAMAX) 100 MG tablet Take 1 tablet (100 mg total) by mouth 2 (two) times daily. 60 tablet 4 12/20/2016 at Unknown time  . traZODone (DESYREL) 100 MG tablet Take 1 tablet (100 mg total) by mouth at bedtime as needed for sleep. (Patient taking differently: Take 100-200 mg by mouth at bedtime as needed for sleep. ) 30 tablet 4 12/19/2016 at Unknown time  . [DISCONTINUED] albuterol (PROVENTIL HFA;VENTOLIN HFA) 108 (90 Base) MCG/ACT inhaler Inhale 2 puffs into the lungs every 4 (four) hours as needed for wheezing. (Patient taking differently: Inhale 2 puffs into the lungs every 8 (eight) hours as needed for wheezing or shortness of breath. ) 1 Inhaler 2 12/16/2016 at Unknown time  . [DISCONTINUED] fluticasone (FLOVENT HFA) 110 MCG/ACT inhaler Inhale 2 puffs into the lungs 2 (two) times daily. 3 Inhaler 3 12/20/2016 at Unknown time  . [DISCONTINUED] hydrochlorothiazide (HYDRODIURIL) 25 MG tablet Take 1 tablet (25 mg total) by mouth daily. 30 tablet 4 12/20/2016 at Unknown time  . [DISCONTINUED] lisinopril  (PRINIVIL,ZESTRIL) 5 MG tablet Take 1 tablet (5 mg total) by mouth daily. 30 tablet 6 12/20/2016 at Unknown time    Patient Stressors: Medication change or noncompliance Other: living situation  Patient Strengths: Ability for insight Average or above average intelligence Capable of independent living General fund of knowledge Motivation for treatment/growth  Treatment Modalities: Medication Management, Group therapy, Case management,  1 to 1 session with clinician, Psychoeducation, Recreational therapy.   Physician Treatment Plan for Primary Diagnosis: Bipolar I disorder, most recent episode depressed ([place]) Long Term Goal(s): Improvement in symptoms so as ready for discharge Improvement in  symptoms so as ready for discharge   Short Term Goals: Ability to identify changes in lifestyle to reduce recurrence of condition will improve Ability to verbalize feelings will improve Ability to identify and develop effective coping behaviors will improve Ability to verbalize feelings will improve Ability to demonstrate self-control will improve Compliance with prescribed medications will improve  Medication Management: Evaluate patient's response, side effects, and tolerance of medication regimen.  Therapeutic Interventions: 1 to 1 sessions, Unit Group sessions and Medication administration.  Evaluation of Outcomes: Adequate for discharge   Physician Treatment Plan for Secondary Diagnosis: Principal Problem:   Bipolar I disorder, most recent episode depressed ([place]) Active Problems:   Type 2 diabetes mellitus with hyperglycemia ([place])   Bipolar 1 disorder, mixed, severe ([place])  Long Term Goal(s): Improvement in symptoms so as ready for discharge Improvement in symptoms so as ready for discharge   Short Term Goals: Ability to identify changes in lifestyle to reduce recurrence of condition will improve Ability to verbalize feelings will improve Ability to identify and develop effective  coping behaviors will improve Ability to verbalize feelings will improve Ability to demonstrate self-control will improve Compliance with prescribed medications will improve     Medication Management: Evaluate patient's response, side effects, and tolerance of medication regimen.  Therapeutic Interventions: 1 to 1 sessions, Unit Group sessions and Medication administration.  Evaluation of Outcomes: Adequate for discharge    RN Treatment Plan for Primary Diagnosis: Bipolar I disorder, most recent episode depressed ([place]) Long Term Goal(s): Knowledge of disease and therapeutic regimen to maintain health will improve  Short Term Goals: Ability to remain free from injury will improve, Ability to verbalize feelings will improve and Ability to disclose and discuss suicidal ideas  Medication Management: RN will administer medications as ordered by provider, will assess and evaluate patient's response and provide education to patient for prescribed medication. RN will report any adverse and/or side effects to prescribing provider.  Therapeutic Interventions: 1 on 1 counseling sessions, Psychoeducation, Medication administration, Evaluate responses to treatment, Monitor vital signs and CBGs as ordered, Perform/monitor CIWA, COWS, AIMS and Fall Risk screenings as ordered, Perform wound care treatments as ordered.  Evaluation of Outcomes:Adequate for discharge    LCSW Treatment Plan for Primary Diagnosis: Bipolar I disorder, most recent episode depressed ([place]) Long Term Goal(s): Safe transition to appropriate next level of care at discharge, Engage patient in therapeutic group addressing interpersonal concerns.  Short Term Goals: Engage patient in aftercare planning with referrals and resources, Facilitate patient progression through stages of change regarding substance use diagnoses and concerns and Identify triggers associated with mental health/substance abuse issues  Therapeutic Interventions:  Assess for all discharge needs, 1 to 1 time with Social worker, Explore available resources and support systems, Assess for adequacy in community support network, Educate family and significant other(s) on suicide prevention, Complete Psychosocial Assessment, Interpersonal group therapy.  Evaluation of Outcomes: Adequate for discharge    Progress in Treatment: Attending groups: Yes. Participating in groups: Yes. Taking medication as prescribed: Yes. Toleration medication: Yes. Family/Significant other contact made: Yes, individual(s) contacted:  pt's mother. Patient understands diagnosis: Yes. Discussing patient identified problems/goals with staff: Yes. Medical problems stabilized or resolved: Yes. Denies suicidal/homicidal ideation: Yes. Issues/concerns per patient self-inventory: No. Other: n/a  New problem(s) identified: No, Describe:  n/a  New Short Term/Long Term Goal(s): elimination of SI and AH, medication management for mood stabilization; development of comprehensive mental wellness/sobriety plan.   Discharge Plan or Barriers: follow-up [facility] with [facility]. Pt also provided with [place] pamphlet and information.   Reason for Continuation of Hospitalization: none  Estimated Length of Stay: Tuesday 12/24/16  Attendees: Patient: 12/24/2016 9:56 AM  Physician: Dr. Dwyane Dee MD 12/24/2016 9:56 AM  Nursing: Andria Frames RN 12/24/2016 9:56 AM  RN Care Manager: Skipper Cliche CM 12/24/2016 9:56 AM  Social Worker: Maxie Better, LCSW 12/24/2016 9:56 AM  Recreational Therapist: x 12/24/2016 9:56 AM  Other: Lindell Spar NP 12/24/2016 9:56 AM  Other:  12/24/2016 9:56 AM  Other: 12/24/2016 9:56 AM    Scribe for Treatment Team: [place], LCSW 12/24/2016 9:56 AM

## 2016-12-24 NOTE — Progress Notes (Signed)
Patient ID: Andrea [name], female   DOB: 1980/07/04, 36 y.o.   MRN: [id number]  Discharge Note: Belongings returned to patient at time of discharge. Discharge instructions and medications were reviewed with patient. Patient verbalized understanding of both medications and discharge instructions. Patient discharged to lobby where her ride was waiting. Q15 minute safety checks maintained until discharge. No distress noted upon discharge.

## 2016-12-24 NOTE — Progress Notes (Signed)
  [facility] Adult Case Management Discharge Plan :  Will you be returning to the same living situation after discharge:  Yes,  home At discharge, do you have transportation home?: Yes,  boyfriend Do you have the ability to pay for your medications: Yes,  mental health  Release of information consent forms completed and submitted to medical records by CSW.  Patient to Follow up at: [facility] on 01/01/2017.   Why:  Appt for counseling with Jolyn Nap at 11:00AM on Wednesday, 01/01/17. You will be scheduled for medication management while at this appt. Thank you.  Contact information: [place] [address] [phone]           Next level of care provider has access to [place] and Suicide Prevention discussed: Yes,  SPE completed with pt's mother. [facility] pamphlet and Mobile Crisis information provided to pt.   Have you used any form of tobacco in the last 30 days? (Cigarettes, Smokeless Tobacco, Cigars, and/or Pipes): No  Has patient been referred to the Quitline?: N/A patient is not a smoker  Patient has been referred for addiction treatment: Yes  Samentha Perham N Smart LCSW 12/24/2016, 9:55 AM

## 2016-12-24 NOTE — Progress Notes (Signed)
Patient ID: Andrea [name], female   DOB: [birth year], 36 y.o.   MRN: [id number]  DAR: Pt. Denies SI/HI and A/V Hallucinations. She reports sleep is good, appetite is good, energy level is normal, and concentration is good. She rates depression 4/10, hopelessness 0/10, and anxiety 4/10. Patient does not report any pain or discomfort at this time. Support and encouragement provided to the patient. Scheduled medications administered to patient per physician's orders. Patient is receptive and cooperative. She is seen in the milieu and interacting with peers appropriately. She requested a print off on information about her Diagnosis. This information is attached to her after visit summary. Q15 minute checks are maintained for safety.

## 2016-12-24 NOTE — [facility] Suicide Risk Assessment (Addendum)
[facility] Discharge Suicide Risk Assessment   Principal Problem: Bipolar I disorder, most recent episode depressed [facility]) Discharge Diagnoses:  Patient Active Problem List   Diagnosis Date Noted  . Bipolar I disorder, most recent episode depressed ([place]) [F31.30] 12/20/2016  . Bipolar 1 disorder, mixed, severe ([place]) [F31.63] 12/20/2016  . Type 2 diabetes mellitus with hyperglycemia ([place]) [E11.65] 09/18/2016  . HTN (hypertension) [I10] 09/18/2016  . Seizures ([place]) [R56.9] 06/04/2016  . Pelvic pain in female [R10.2] 11/14/2015  . Acute pyelonephritis [N10] 01/16/2013  . Migraines [G43.909] 10/30/2012  . Morbid obesity ([place]) [E66.01] 10/30/2012  . Depression with anxiety [F41.8] 10/30/2012  . Kidney stones [N20.0]     Total Time spent with patient: 30 minutes  Musculoskeletal: Strength & Muscle Tone: within normal limits Gait & Station: normal Patient leans: N/A  Psychiatric Specialty Exam: ROS denies headache, no chest pain, no shortness of breath, no vomiting, no rash  Blood pressure 105/72, pulse 86, temperature 98.7 F (37.1 C), temperature source Oral, resp. rate 16, height 6' (1.829 m), weight 125.2 kg (276 lb).Body mass index is 37.43 kg/m.  General Appearance: Well Groomed  Eye Contact::  Good  Speech:  Normal Rate409  Volume:  Normal  Mood:  improved, denies feeling depressed   Affect:  Appropriate and Full Range  Thought Process:  Linear and Descriptions of Associations: Intact  Orientation:  Other:  fully alert and attentive   Thought Content:  denies any current hallucinations, no delusions are expressed, does not appear to be internally preoccupied   Suicidal Thoughts:  No denies any suicidal or self injurious ideations   Homicidal Thoughts:  No denies any homicidal or violent ideations   Memory:  recent and remote grossly intact   Judgement:  Other:  improving   Insight:  improving   Psychomotor Activity:  Normal  Concentration:  Good  Recall:  Good  Fund of  Knowledge:Good  Language: Good  Akathisia:  Negative  Handed:  Right  AIMS (if indicated):   no abnormal or involuntary movements noted or reported   Assets:  Communication Skills Desire for Improvement Resilience  Sleep:  Number of Hours: 6.75  Cognition: WNL  ADL's:  Intact   Mental Status Per Nursing Assessment::   On Admission:     Demographic Factors:  Single, lives alone, no children, currently unemployed   Loss Factors: Does not endorse any clear stressors or losses. Most of her social support system is in [place].  Historical Factors: One prior psychiatric admission for depression, psychotic symptoms, one prior suicide attempt.  Risk Reduction Factors:   Sense of responsibility to family, Living with another person, especially a relative and Positive coping skills or problem solving skills  Continued Clinical Symptoms:  At this time patient is alert and attentive, well related, pleasant, mood improved, affect appropriate and full in range, no thought disorder, denies hallucinations, and does not appear internally preoccupied, no delusions are expressed, no suicidal or self injurious ideations, no homicidal ideations, future oriented, stating she plans to look for a job after discharge and plans to relocate to [place] in the near future  No disruptive or agitated behaviors on unit  Denies medication side effects. We have reviewed medication side effects.  Cognitive Features That Contribute To Risk:  No gross cognitive deficits noted upon discharge. Is alert , attentive, and oriented x 3   Suicide Risk:  Mild:  Suicidal ideation of limited frequency, intensity, duration, and specificity.  There are no identifiable plans, no associated intent, mild  dysphoria and related symptoms, good self-control (both objective and subjective assessment), few other risk factors, and identifiable protective factors, including available and accessible social support.  [facility] on 01/01/2017.   Why:  Appt for counseling with Jolyn Nap at 11:00AM on Wednesday, 01/01/17. You will be scheduled for medication management while at this appt. Thank you.  Contact information: [address] [place] [address] [phone]           Plan Of Care/Follow-up recommendations:  Activity:  as tolerated  Diet:  Diabetic , heart healthy diet  Tests:  NA Other:  See below  Patient is leaving unit in good spirits. Plans to return home- states boyfriend will pick her up later today Plans to follow up as above  She has a PCP for management of medical issues- Dr. Wolfgang Phoenix , at [place], MD 12/24/2016, 10:12 AM

## 2016-12-24 NOTE — Discharge Summary (Signed)
Physician Discharge Summary Note  Patient:  Andrea [name] is an 36 y.o., female MRN:  [id number] DOB:  1980/08/06 Patient phone:  [phone] (home)  Patient address:   [address] [place] [address],  Total Time spent with patient: Greater than 30 minutes  Date of Admission:  12/20/2016 Date of Discharge: 12-24-16  Reason for Admission: Auditory hallucinations commanding patient to take her life.  Principal Problem: Bipolar I disorder, most recent episode depressed [facility])  Discharge Diagnoses: Patient Active Problem List   Diagnosis Date Noted  . Bipolar I disorder, most recent episode depressed ([place]) [F31.30] 12/20/2016  . Bipolar 1 disorder, mixed, severe ([place]) [F31.63] 12/20/2016  . Type 2 diabetes mellitus with hyperglycemia ([place]) [E11.65] 09/18/2016  . HTN (hypertension) [I10] 09/18/2016  . Seizures ([place]) [R56.9] 06/04/2016  . Pelvic pain in female [R10.2] 11/14/2015  . Acute pyelonephritis [N10] 01/16/2013  . Migraines [G43.909] 10/30/2012  . Morbid obesity ([place]) [E66.01] 10/30/2012  . Depression with anxiety [F41.8] 10/30/2012  . Kidney stones [N20.0]    Past Psychiatric History: Bipolar 1 disorder, depressed.  Past Medical History:  Past Medical History:  Diagnosis Date  . Anxiety   . Bipolar 1 disorder ([place])   . Complication of anesthesia    hard time waking up   . Depression   . GERD (gastroesophageal reflux disease)    no meds  . Kidney stones   . Low back pain   . Migraines   . PCOS (polycystic ovarian syndrome)   . PONV (postoperative nausea and vomiting)   . Schizophrenia ([place])   . Shortness of breath dyspnea    with bronchitis    Past Surgical History:  Procedure Laterality Date  . ABDOMINAL HYSTERECTOMY N/A 11/14/2015   Procedure: HYSTERECTOMY ABDOMINAL;  Surgeon: Olga Millers, MD;  Location: [place] [facility];  Service: Gynecology;  Laterality: N/A;  . CHOLECYSTECTOMY    . INTRAUTERINE DEVICE (IUD) INSERTION  06/14/2014   [facility]   . LYMPH NODES REMOVED    . ovarian cyst removed    . SALPINGOOPHORECTOMY Bilateral 11/14/2015   Procedure: SALPINGO OOPHORECTOMY;  Surgeon: Olga Millers, MD;  Location: [place] [facility];  Service: Gynecology;  Laterality: Bilateral;   Family History: History reviewed. No pertinent family history.  Family Psychiatric  History: See H&P  Social History:  History  Alcohol Use No     History  Drug Use No    Social History   Social History  . Marital status: Divorced    Spouse name: N/A  . Number of children: N/A  . Years of education: N/A   Social History Main Topics  . Smoking status: Never Smoker  . Smokeless tobacco: Never Used  . Alcohol use No  . Drug use: No  . Sexual activity: No   Other Topics Concern  . None   Social History Narrative  . None   Hospital Course: Andrea [name] is a 36 y.o.femalepresenting to the [facility] experiencing hallucinations with commands to take her life. Recently started hearing voices in the spring of 2018, acted on commands to take her life and was hospitalized at [facility]. Since her discharged was placed on Abilify, 2mg . Due to continued hallucinations the dosage was increased to Abilify 5mg . Symptoms had improved until the past week. Patient has experienced a recurrence of hallucinations over the past week, commands to take her life by OD or cutting herself. The patient receives services at [facility], weekly therapy and once monthly psychiatry. 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 Has family support in the [place] area and friends locally. Denies drug use.  Andrea [name] was admitted to the [facility] adult unit with complaints of worsening symptoms of Bipolar disorder with psychosis. She was having auditory hallucinations commanding her to take her own life. She was in need of mood stabilization treatments.   During the course of her hospitalization, Andrea [name] was medicated & discharged on, Abilify 15 mg for mood control, Fluoxetine 40 mg  for depression, Hydroxyzine 25 mg prn for anxiety, Topamax 100 mg for mood stabilization & Trazodone 100 mg for insomnia. She was enrolled & participated in the group counseling sessions being offered & held on this unit. She was counseled & learned coping skills that should help her cope better & maintain mood stability after discharge. She was resumed on all her pertinent home medications for the other previously existing medical issues presented. She tolerated her treatment regimen without any adverse effects reported.   While her treatment was on going, Andrea [name]'s improvement was monitored by observation & her daily reports of symptom reduction noted.  Her emotional & mental status were monitored by daily self-inventory reports completed by her & the clinical staff. She was evaluated daily by the treatment team for mood stability & the need for continued recovery after discharge. She was offered further treatment options upon discharge & will follow up with the outpatient psychiatric services as listed below.     Upon discharge, Andrea [name] was both mentally & medically stable. She is currently denying suicidal, homicidal ideation, auditory, visual/tactile hallucinations, delusional thoughts & or paranoia. She was provided with a 7 days worth, supply samples of her [facility] discharge medications. She left [facility] with all personal belongings in no apparent distress. Transportation per boyfriend.    Physical Findings: AIMS: Facial and Oral Movements Muscles of Facial Expression: None, normal Lips and Perioral Area: None, normal Jaw: None, normal Tongue: None, normal,Extremity Movements Upper (arms, wrists, hands, fingers): None, normal Lower (legs, knees, ankles, toes): None, normal, Trunk Movements Neck, shoulders, hips: None, normal, Overall Severity Severity of abnormal movements (highest score from questions above): None, normal Incapacitation due to abnormal movements: None, normal Patient's awareness of  abnormal movements (rate only patient's report): No Awareness, Dental Status Current problems with teeth and/or dentures?: No Does patient usually wear dentures?: No  CIWA:    COWS:     Musculoskeletal: Strength & Muscle Tone: within normal limits Gait & Station: normal Patient leans: N/A  Psychiatric Specialty Exam: Physical Exam  ROS  Blood pressure 105/72, pulse 86, temperature 98.7 F (37.1 C), temperature source Oral, resp. rate 16, height 6' (1.829 m), weight 125.2 kg (276 lb).Body mass index is 37.43 kg/m.  See Md's SRA   Have you used any form of tobacco in the last 30 days? (Cigarettes, Smokeless Tobacco, Cigars, and/or Pipes): No  Has this patient used any form of tobacco in the last 30 days? (Cigarettes, Smokeless Tobacco, Cigars, and/or Pipes): No  Blood Alcohol level:  Lab Results  Component Value Date   ETH <5 57/84/6962   Metabolic Disorder Labs:  Lab Results  Component Value Date   HGBA1C 7.2 09/18/2016   MPG 120 (H) 05/16/2014   MPG 120 (H) 02/22/2014   No results found for: PROLACTIN Lab Results  Component Value Date   CHOL 144 02/22/2014   TRIG 108 02/22/2014   HDL 29 (L) 02/22/2014   CHOLHDL 5.0 02/22/2014   VLDL 22 02/22/2014   LDLCALC 93 02/22/2014   See  Psychiatric Specialty Exam and Suicide Risk Assessment completed by Attending Physician prior to discharge.  Discharge destination:  Home  Is patient on multiple antipsychotic therapies at discharge:  No   Has Patient had three or more failed trials of antipsychotic monotherapy by history:  No  Recommended Plan for Multiple Antipsychotic Therapies: NA  Allergies as of 12/24/2016      Reactions   Bactrim [sulfamethoxazole-trimethoprim] Hives, Itching   Vicodin [hydrocodone-acetaminophen] Hives, Itching      Medication List    STOP taking these medications   cephALEXin 500 MG capsule Commonly known as:  KEFLEX   clonazePAM 0.5 MG tablet Commonly known as:  KLONOPIN    cyclobenzaprine 10 MG tablet Commonly known as:  FLEXERIL   oxyCODONE-acetaminophen 10-325 MG tablet Commonly known as:  PERCOCET   predniSONE 20 MG tablet Commonly known as:  DELTASONE   tamsulosin 0.4 MG Caps capsule Commonly known as:  FLOMAX   tiZANidine 4 MG tablet Commonly known as:  ZANAFLEX     TAKE these medications     Indication  albuterol 108 (90 Base) MCG/ACT inhaler Commonly known as:  PROVENTIL HFA;VENTOLIN HFA Inhale 2 puffs into the lungs every 4 (four) hours as needed for wheezing. What changed:  when to take this  reasons to take this  Indication:  Asthma, Chronic Obstructive Lung Disease   ARIPiprazole 15 MG tablet Commonly known as:  ABILIFY Take 1 tablet (15 mg total) by mouth at bedtime. For mood control What changed:  medication strength  how much to take  when to take this  additional instructions  Another medication with the same name was removed. Continue taking this medication, and follow the directions you see here.  Indication:  Mood control   FLUoxetine 40 MG capsule Commonly known as:  PROZAC Take 1 capsule (40 mg total) by mouth daily. For depression Start taking on:  12/25/2016 What changed:  additional instructions  Indication:  Major Depressive Disorder   fluticasone 110 MCG/ACT inhaler Commonly known as:  FLOVENT HFA Inhale 2 puffs into the lungs 2 (two) times daily. For shortness of breath What changed:  additional instructions  Indication:  Asthma   hydrochlorothiazide 25 MG tablet Commonly known as:  HYDRODIURIL Take 1 tablet (25 mg total) by mouth daily. For high blood pressure What changed:  additional instructions  Indication:  High Blood Pressure Disorder   hydrOXYzine 25 MG tablet Commonly known as:  ATARAX/VISTARIL Take 1 tablet (25 mg total) by mouth every 6 (six) hours as needed for anxiety.  Indication:  Anxiety Neurosis   lisinopril 5 MG tablet Commonly known as:  PRINIVIL,ZESTRIL Take 1 tablet (5  mg total) by mouth daily. For high blood pressure What changed:  additional instructions  Indication:  High Blood Pressure Disorder   topiramate 100 MG tablet Commonly known as:  TOPAMAX Take 1 tablet (100 mg total) by mouth 2 (two) times daily. For mood stabilization What changed:  additional instructions  Indication:  Mood stabilization   traZODone 100 MG tablet Commonly known as:  DESYREL Take 1 tablet (100 mg total) by mouth at bedtime as needed for sleep. What changed:  how much to take  Indication:  [place], [place] Follow up on 01/01/2017.   Why:  Appt for counseling with Jolyn Nap at 11:00AM on Wednesday, 01/01/17. You will be scheduled for medication management while at this appt. Thank you.  Contact information: [place]  [place] [address] [phone]          Follow-up recommendations: Activity:  As tolerated Diet: As recommended by your primary care doctor. Keep all scheduled follow-up appointments as recommended.   Comments: Patient is instructed prior to discharge to: Take all medications as prescribed by his/her mental healthcare provider. Report any adverse effects and or reactions from the medicines to his/her outpatient provider promptly. Patient has been instructed & cautioned: To not engage in alcohol and or illegal drug use while on prescription medicines. In the event of worsening symptoms, patient is instructed to call the crisis hotline, 911 and or go to the nearest ED for appropriate evaluation and treatment of symptoms. To follow-up with his/her primary care provider for your other medical issues, concerns and or health care needs.   Signed: Encarnacion Slates, NP, PMHNP, FNP-BC 12/24/2016, 9:37 AM

## 2016-12-24 NOTE — [facility] Group Notes (Signed)
[facility] Group Notes:  Recovery Patient attended group. She was engaged in group and participated fully.

## 2016-12-25 ENCOUNTER — Other Ambulatory Visit: Payer: Self-pay | Admitting: *Deleted

## 2016-12-25 ENCOUNTER — Telehealth: Payer: Self-pay | Admitting: *Deleted

## 2016-12-25 MED ORDER — ALBUTEROL SULFATE HFA 108 (90 BASE) MCG/ACT IN AERS: 2.0000 | INHALATION_SPRAY | RESPIRATORY_TRACT | 2 refills | Status: DC | PRN

## 2016-12-25 NOTE — Telephone Encounter (Signed)
Patient called regarding with a question about getting her albuterol refilled patient states she hasn't heard anything else about it . Please advise [phone]

## 2016-12-25 NOTE — Telephone Encounter (Signed)
Discussed with pt. Pt states pharm never received med. I let pt know I will refax med to pharm requested.

## 2016-12-31 ENCOUNTER — Ambulatory Visit (INDEPENDENT_AMBULATORY_CARE_PROVIDER_SITE_OTHER): Payer: Self-pay | Admitting: Family Medicine

## 2016-12-31 ENCOUNTER — Encounter: Payer: Self-pay | Admitting: Family Medicine

## 2016-12-31 VITALS — BP 128/70 | Temp 98.7°F | Ht 72.0 in | Wt 279.0 lb

## 2016-12-31 DIAGNOSIS — R252 Cramp and spasm: Secondary | ICD-10-CM

## 2016-12-31 DIAGNOSIS — E876 Hypokalemia: Secondary | ICD-10-CM

## 2016-12-31 DIAGNOSIS — R300 Dysuria: Secondary | ICD-10-CM

## 2016-12-31 LAB — POCT URINALYSIS DIPSTICK: pH, UA: 5 (ref 5.0–8.0)

## 2016-12-31 MED ORDER — NITROFURANTOIN MONOHYD MACRO 100 MG PO CAPS: 100.0000 mg | ORAL_CAPSULE | Freq: Two times a day (BID) | ORAL | 0 refills | Status: DC

## 2016-12-31 MED ORDER — DICLOFENAC SODIUM 75 MG PO TBEC: DELAYED_RELEASE_TABLET | ORAL | 0 refills | Status: DC

## 2016-12-31 NOTE — Progress Notes (Signed)
   Subjective:    Patient ID: Andrea [name], female    DOB: [birth year], 36 y.o.   MRN: [id number]  Back Pain  This is a new problem. Episode onset: 2 days  (Painful urination) Treatments tried: went to [facility] ED, aleve.  Patient was in the ER they did a scan it did other lab work they did not do a urine she relates dysuria urinary frequency denies severe abdominal pain denies fever chills sweats Intermittent cramping in the legs she states then the ER her potassium was low Cramping in legs for a few weeks.  In addition to this she states her red A1c and it was elevated  Review of Systems  Musculoskeletal: Positive for back pain.   This your urinary frequency no nausea vomiting diarrhea    Objective:   Physical Exam  Lungs clear heart regular low back subjective discomfort right hamstrings increase low back pain with straight leg raise  UA with WBCs sent for culture    Assessment & Plan:  Probable UTI Macrobid twice a day for 7 days If progressive troubles or worse follow-up Await labs papers etc. from ER may need to be on medication for diabetes depending on what this shows  Lab work ordered to check metabolic 7 and magnesium

## 2017-01-01 LAB — BASIC METABOLIC PANEL
BUN/Creatinine Ratio: 20 (ref 9–23)
BUN: 18 mg/dL (ref 6–20)
CALCIUM: 9.3 mg/dL (ref 8.7–10.2)
CO2: 26 mmol/L (ref 20–29)
CREATININE: 0.91 mg/dL (ref 0.57–1.00)
Chloride: 99 mmol/L (ref 96–106)
GFR, EST AFRICAN AMERICAN: 94 mL/min/{1.73_m2} (ref >59)
GFR, EST NON AFRICAN AMERICAN: 81 mL/min/{1.73_m2} (ref >59)
Glucose: 105 mg/dL — ABNORMAL HIGH (ref 65–99)
Potassium: 3.6 mmol/L (ref 3.5–5.2)
Sodium: 143 mmol/L (ref 134–144)

## 2017-01-01 LAB — MAGNESIUM: Magnesium: 2 mg/dL (ref 1.6–2.3)

## 2017-01-02 LAB — URINE CULTURE

## 2017-01-13 ENCOUNTER — Encounter: Payer: Self-pay | Admitting: Family Medicine

## 2017-01-13 ENCOUNTER — Ambulatory Visit (INDEPENDENT_AMBULATORY_CARE_PROVIDER_SITE_OTHER): Payer: Self-pay | Admitting: Family Medicine

## 2017-01-13 VITALS — BP 110/72 | Temp 98.6°F | Ht 72.0 in | Wt 278.0 lb

## 2017-01-13 DIAGNOSIS — J4530 Mild persistent asthma, uncomplicated: Secondary | ICD-10-CM

## 2017-01-13 DIAGNOSIS — J019 Acute sinusitis, unspecified: Secondary | ICD-10-CM

## 2017-01-13 MED ORDER — CEPHALEXIN 500 MG PO CAPS: 500.0000 mg | ORAL_CAPSULE | Freq: Four times a day (QID) | ORAL | 0 refills | Status: DC

## 2017-01-13 MED ORDER — PREDNISONE 20 MG PO TABS: ORAL_TABLET | ORAL | 0 refills | Status: DC

## 2017-01-13 NOTE — Progress Notes (Signed)
   Subjective:    Patient ID: Andrea [name], female    DOB: [birth year], 36 y.o.   MRN: [id number]  Cough  This is a new problem. Episode onset: 3 days. Associated symptoms include a fever, rhinorrhea and wheezing. Pertinent negatives include no chest pain, ear pain or shortness of breath. Associated symptoms comments: Headache, sore throat, fever, ear pain, congestion, wheezing.. Treatments tried: inhaler, allergy meds.   Patient with 5-6 days of head congestion drainage now sinus pressure pain discomfort left eye crusting also significant wheezing over the past 3-4 days dramatic increase in the use of her inhaler including nighttime use   Review of Systems  Constitutional: Positive for fatigue and fever. Negative for activity change.  HENT: Positive for congestion and rhinorrhea. Negative for ear pain.   Eyes: Negative for discharge.  Respiratory: Positive for cough and wheezing. Negative for shortness of breath.   Cardiovascular: Negative for chest pain.       Objective:   Physical Exam  Constitutional: She appears well-developed.  HENT:  Head: Normocephalic.  Nose: Nose normal.  Mouth/Throat: Oropharynx is clear and moist. No oropharyngeal exudate.  Neck: Neck supple.  Cardiovascular: Normal rate and normal heart sounds.   No murmur heard. Pulmonary/Chest: Effort normal and breath sounds normal. She has no wheezes.  Lymphadenopathy:    She has no cervical adenopathy.  Skin: Skin is warm and dry.  Nursing note and vitals reviewed.  Patient is not respiratory distress Does have moderate sinus tenderness Left lower eyelid shows evidence of gland infection of the eyelid       Assessment & Plan:  Viral syndrome Sinusitis Acute bronchitis Reactive airway Prednisone taper Continue Flovent and Ventolin No rest or distress but if problems follow-up sooner here or ER Warm compresses to the left eye when necessary

## 2017-01-21 ENCOUNTER — Encounter: Payer: Self-pay | Admitting: Family Medicine

## 2017-01-22 ENCOUNTER — Encounter: Payer: Self-pay | Admitting: Family Medicine

## 2017-01-22 ENCOUNTER — Telehealth: Payer: Self-pay | Admitting: Family Medicine

## 2017-01-22 ENCOUNTER — Ambulatory Visit (INDEPENDENT_AMBULATORY_CARE_PROVIDER_SITE_OTHER): Payer: Self-pay | Admitting: Family Medicine

## 2017-01-22 ENCOUNTER — Other Ambulatory Visit: Payer: Self-pay | Admitting: *Deleted

## 2017-01-22 VITALS — BP 100/80 | Ht 72.0 in | Wt 278.0 lb

## 2017-01-22 DIAGNOSIS — N200 Calculus of kidney: Secondary | ICD-10-CM

## 2017-01-22 DIAGNOSIS — M5440 Lumbago with sciatica, unspecified side: Secondary | ICD-10-CM

## 2017-01-22 LAB — POCT URINALYSIS DIPSTICK
RBC UA: NEGATIVE
Spec Grav, UA: 1.015 (ref 1.010–1.025)
pH, UA: 6 (ref 5.0–8.0)

## 2017-01-22 MED ORDER — SUMATRIPTAN SUCCINATE 50 MG PO TABS: ORAL_TABLET | ORAL | 3 refills | Status: DC

## 2017-01-22 NOTE — Telephone Encounter (Signed)
Med sent. Pt notified.  

## 2017-01-22 NOTE — Telephone Encounter (Signed)
We can use Imitrex for her migraines and we can call that in. As for her kidney stone we can see her here in the office in at least get her on some pain medicine as well as potentially Flomax would be best for the patient to be seen today or tomorrow

## 2017-01-22 NOTE — Telephone Encounter (Signed)
Imitrex 50 mg, #6, one at first sign a migraine, may repeat 2 hours later if need be for migraine, 3 refills, recommend the patient follows up office visit if ongoing migraine headaches

## 2017-01-22 NOTE — Telephone Encounter (Signed)
Pt coming in today to see dr Richardson Landry because your schedule if full for kidney stone. Pt would like imitrex sent in. Med not on med list. Need dose and directions.

## 2017-01-23 ENCOUNTER — Ambulatory Visit: Payer: Self-pay | Admitting: Family Medicine

## 2017-01-23 MED ORDER — OXYCODONE-ACETAMINOPHEN 10-325 MG PO TABS: ORAL_TABLET | ORAL | 0 refills | Status: DC

## 2017-01-23 NOTE — Progress Notes (Signed)
Subjective:     Patient ID: Andrea [name], female   DOB: 07-30-1980, 36 y.o.   MRN: [id number]  HPI Patient arrives office with flank pain right-sided. At times colicky nature. Some radiation the right lower quadrant. Positive history of kidney stones. Mild nausea. Patient states she has nausea medicine. No fever no chills  Review of Systems No headache, no major weight loss or weight gain, no chest pain no back pain abdominal pain no change in bowel habits complete ROS otherwise negative     Objective:   Physical Exam Alert vitals stable, NAD. Blood pressure good on repeat. HEENT normal. Lungs clear. Heart regular rate and rhythm. Positive right flank tenderness minimal abdominal tenderness  Urinalysis 2-4 white blood cells per high-power field    Assessment:     Impression probable right acute kidney stone    Plan:     Patient states she needed oxycodone 10 mg last time to take care of her pain. This is prescribed. Increase fluid intake. Take anti-inflammatory medicine nausea medicine as needed warning signs discussed

## 2017-01-24 ENCOUNTER — Ambulatory Visit: Payer: Self-pay | Admitting: Family Medicine

## 2017-02-07 ENCOUNTER — Encounter: Payer: Self-pay | Admitting: Family Medicine

## 2017-02-07 MED ORDER — CYCLOBENZAPRINE HCL 10 MG PO TABS: 10.0000 mg | ORAL_TABLET | Freq: Three times a day (TID) | ORAL | 0 refills | Status: DC | PRN

## 2017-02-07 NOTE — Addendum Note (Signed)
Addended by: Launa Grill on: 02/07/2017 01:39 PM   Modules accepted: Orders

## 2017-02-07 NOTE — Telephone Encounter (Signed)
Please clarify with the patient is she using 5 mg or 10 mg then order accordingly thank you

## 2017-02-07 NOTE — Telephone Encounter (Signed)
It is fine to go ahead and give her prescription for Flexeril 1-3 times a day when necessary-#90. Also let the patient know that ideally we want her to take this as infrequently as possible. This medication can cause drowsiness. It is best not to rely on a medicine like this on ongoing basis.

## 2017-02-07 NOTE — Telephone Encounter (Signed)
Flexeril is not on patient's current medication list. Which strength would you like for me to send in

## 2017-02-07 NOTE — Addendum Note (Signed)
Addended by: Ofilia Neas R on: 02/07/2017 11:58 AM   Modules accepted: Orders

## 2017-04-24 ENCOUNTER — Other Ambulatory Visit: Payer: Self-pay | Admitting: Family Medicine

## 2017-04-26 NOTE — Telephone Encounter (Signed)
Approve each times 6

## 2017-05-01 ENCOUNTER — Other Ambulatory Visit: Payer: Self-pay | Admitting: Family Medicine

## 2017-05-01 NOTE — Telephone Encounter (Signed)
Last seen 01/22/2017

## 2017-05-13 ENCOUNTER — Ambulatory Visit: Payer: Self-pay | Admitting: Family Medicine

## 2017-05-13 ENCOUNTER — Encounter: Payer: Self-pay | Admitting: Family Medicine

## 2017-05-13 VITALS — BP 124/84 | Ht 72.0 in | Wt 276.0 lb

## 2017-05-13 DIAGNOSIS — R109 Unspecified abdominal pain: Secondary | ICD-10-CM

## 2017-05-13 MED ORDER — OXYCODONE-ACETAMINOPHEN 10-325 MG PO TABS: ORAL_TABLET | ORAL | 0 refills | Status: DC

## 2017-05-13 MED ORDER — OXYCODONE-ACETAMINOPHEN 10-325 MG PO TABS
ORAL_TABLET | ORAL | 0 refills | Status: DC
Start: 2017-05-13 — End: 2017-05-13

## 2017-05-13 MED ORDER — TAMSULOSIN HCL 0.4 MG PO CAPS: 0.4000 mg | ORAL_CAPSULE | Freq: Every day | ORAL | 1 refills | Status: DC

## 2017-05-13 NOTE — Progress Notes (Signed)
   Subjective:    Patient ID: Andrea [name], female    DOB: 1980/07/02, 36 y.o.   MRN: [id number]  HPI  Patient arrives for an ER follow up for a kidney stone. Patient with significant flank pain on the right side she has had this ongoing over the past several days at times it is severe at other times is not as bad tramadol not helping patient went to the ER they told her that they should not do a CT scan because of all the radiation she is previously had she is trying to pass it on her own she denies any other particular troubles no fever chills vomiting Review of Systems Please see above history of kidney stones   She denies any fever chills sweats Objective:   Physical Exam Lungs clear heart regular abdomen is soft subjective discomfort on the right side  Urinalysis overall occasional RBC occasional WBC quite a few epithelial cells I do not feel the patient has urinary infection or pyelonephritis    Assessment & Plan:  History kidney stones History of renal colic Has renal colic currently Patient unfortunately does not have insurance which she states it makes it hard to afford testing We will go with Flomax on a daily basis oxycodone for severe pain if not improving over the next several days I would recommend a KUB if this does not provide enough information then CT scan renal protocol

## 2017-05-19 ENCOUNTER — Encounter: Payer: Self-pay | Admitting: Family Medicine

## 2017-06-18 ENCOUNTER — Ambulatory Visit (INDEPENDENT_AMBULATORY_CARE_PROVIDER_SITE_OTHER): Payer: Self-pay | Admitting: Nurse Practitioner

## 2017-06-18 ENCOUNTER — Encounter: Payer: Self-pay | Admitting: Nurse Practitioner

## 2017-06-18 ENCOUNTER — Encounter: Payer: Self-pay | Admitting: Family Medicine

## 2017-06-18 VITALS — BP 132/86 | Ht 72.0 in | Wt 269.6 lb

## 2017-06-18 DIAGNOSIS — J300 Vasomotor rhinitis: Secondary | ICD-10-CM

## 2017-06-18 DIAGNOSIS — M940 Chondrocostal junction syndrome [Tietze]: Secondary | ICD-10-CM

## 2017-06-18 DIAGNOSIS — J4531 Mild persistent asthma with (acute) exacerbation: Secondary | ICD-10-CM

## 2017-06-18 MED ORDER — PREDNISONE 20 MG PO TABS: ORAL_TABLET | ORAL | 0 refills | Status: DC

## 2017-06-18 NOTE — Progress Notes (Signed)
Subjective: Presents for recheck after hospitalization at hospital in [place].  Was hospitalized for severe exacerbation of her asthma.  Has been on Flovent long-term which she says has controlled her symptoms well.  Began on 12/24.  Hospitalized on 12/26.  Discharge 12/28.  According to patient her last chest x-ray was clear.  Was not discharged home on any new medications.  Continues to use Flovent and albuterol.  Using her albuterol still about every 4 hours.  Last used about an hour ago.  No documented fever but has had chills and sweats.  Frontal area headache.  No sore throat.  Off-and-on ear pain.  Spells of nonproductive cough.  Wheezing at times.  Chest pain with deep breath and cough, that has been going on the entire time.  Denies any acid reflux symptoms.  Records from hospitalization were unavailable during office visit.  Objective:   BP 132/86   Ht 6' (1.829 m)   Wt 269 lb 9.6 oz (122.3 kg)   LMP  (LMP Unknown)   SpO2 97%   BMI 36.56 kg/m  NAD.  Alert, oriented.  TMs retracted, no erythema.  Pharynx clear.  Neck supple with mild soft slightly tender anterior adenopathy.  Lungs clear but mildly distant breath sounds.  No tachypnea.  Normal color.  Heart regular rate and rhythm. Tenderness with palpation of the lower anterior chest wall just beneath the breasts bilaterally. Milder tenderness noted with palpation of the upper anterior chest wall.   Assessment:   Problem List Items Addressed This Visit      Respiratory   Mild persistent asthma with acute exacerbation - Primary   Relevant Medications   predniSONE (DELTASONE) 20 MG tablet    Other Visit Diagnoses    Vasomotor rhinitis       Costochondritis         Plan:   Meds ordered this encounter  Medications  . predniSONE (DELTASONE) 20 MG tablet    Sig: 3 po qd x 3 d then 2 po qd x 3 d then 1 po qd x 2 d    Dispense:  17 tablet    Refill:  0    Order Specific Question:   Supervising Provider    Answer:   Mikey Kirschner  [[id number]]   Course of oral prednisone with the goal of slowly decreasing her use of albuterol over the next week.  Continue Flovent as directed.  No indication for antibiotics at this point.  Warning signs reviewed.  Call back in 48 hours if no improvement, sooner if worse.  Also consider referral to asthma and allergy specialist if wheezing persists.

## 2017-06-20 ENCOUNTER — Ambulatory Visit: Payer: Self-pay | Admitting: Family Medicine

## 2017-07-10 ENCOUNTER — Other Ambulatory Visit: Payer: Self-pay | Admitting: Family Medicine

## 2017-08-13 ENCOUNTER — Encounter: Payer: Self-pay | Admitting: Family Medicine

## 2017-08-13 ENCOUNTER — Ambulatory Visit (INDEPENDENT_AMBULATORY_CARE_PROVIDER_SITE_OTHER): Payer: Self-pay | Admitting: Family Medicine

## 2017-08-13 VITALS — BP 112/70 | Temp 98.3°F | Ht 72.0 in | Wt 270.0 lb

## 2017-08-13 DIAGNOSIS — R300 Dysuria: Secondary | ICD-10-CM

## 2017-08-13 LAB — POCT URINALYSIS DIPSTICK
PH UA: 6 (ref 5.0–8.0)
Spec Grav, UA: <=1.005 — AB (ref 1.010–1.025)

## 2017-08-13 MED ORDER — DICLOFENAC SODIUM 75 MG PO TBEC: 75.0000 mg | DELAYED_RELEASE_TABLET | Freq: Two times a day (BID) | ORAL | 0 refills | Status: DC

## 2017-08-13 MED ORDER — FLUOXETINE HCL 20 MG PO TABS: ORAL_TABLET | ORAL | 3 refills | Status: DC

## 2017-08-13 MED ORDER — CEPHALEXIN 500 MG PO CAPS: 500.0000 mg | ORAL_CAPSULE | Freq: Four times a day (QID) | ORAL | 0 refills | Status: DC

## 2017-08-13 MED ORDER — BUSPIRONE HCL 10 MG PO TABS: 10.0000 mg | ORAL_TABLET | Freq: Three times a day (TID) | ORAL | 2 refills | Status: DC

## 2017-08-13 NOTE — Progress Notes (Signed)
   Subjective:    Patient ID: Andrea [name], female    DOB: 1980-10-20, 37 y.o.   MRN: [id number]  Dysuria   This is a new problem. Episode onset: 5 days. Associated symptoms comments: Fever, painful urination. Treatments tried: azo, motrin.  She relates some dysuria some lower abdominal discomfort denies flank pain denies hematuria denies wheezing or difficulty breathing PMH benign Results for orders placed or performed in visit on 08/13/17  POCT urinalysis dipstick  Result Value Ref Range   Color, UA     Clarity, UA     Glucose, UA     Bilirubin, UA     Ketones, UA     Spec Grav, UA <=1.005 (A) 1.010 - 1.025   Blood, UA     pH, UA 6.0 5.0 - 8.0   Protein, UA     Urobilinogen, UA  0.2 or 1.0 E.U./dL   Nitrite, UA     Leukocytes, UA  Negative   Appearance     Odor        Review of Systems  Constitutional: Negative for activity change, fatigue and fever.  HENT: Negative for congestion.   Respiratory: Negative for cough, chest tightness and shortness of breath.   Cardiovascular: Negative for chest pain and leg swelling.  Gastrointestinal: Negative for abdominal pain.  Genitourinary: Positive for dysuria.  Skin: Negative for color change.  Neurological: Negative for headaches.  Psychiatric/Behavioral: Negative for behavioral problems.       Objective:   Physical Exam  Constitutional: She appears well-developed and well-nourished. No distress.  HENT:  Head: Normocephalic and atraumatic.  Eyes: Right eye exhibits no discharge. Left eye exhibits no discharge.  Neck: No tracheal deviation present.  Cardiovascular: Normal rate, regular rhythm and normal heart sounds.  No murmur heard. Pulmonary/Chest: Effort normal and breath sounds normal. No respiratory distress. She has no wheezes. She has no rales.  Musculoskeletal: She exhibits no edema.  Lymphadenopathy:    She has no cervical adenopathy.  Neurological: She is alert. She exhibits normal muscle tone.  Skin:  Skin is warm and dry. No erythema.  Psychiatric: Her behavior is normal.  Vitals reviewed.    Negative flank pain to percussion negative abdominal pain to palpation soft no masses     Assessment & Plan:  Probable UTI Antibiotics prescribed warning signs discussed Urine sent for culture

## 2017-08-14 ENCOUNTER — Other Ambulatory Visit: Payer: Self-pay | Admitting: Family Medicine

## 2017-08-14 ENCOUNTER — Telehealth: Payer: Self-pay | Admitting: Family Medicine

## 2017-08-14 MED ORDER — FLUOXETINE HCL 20 MG PO CAPS: 20.0000 mg | ORAL_CAPSULE | Freq: Every day | ORAL | 0 refills | Status: DC

## 2017-08-14 NOTE — Telephone Encounter (Signed)
Dawn at [facility] is aware ok.

## 2017-08-14 NOTE — Telephone Encounter (Signed)
Pharmacy faxed over paper asking if we could switch to Fluoxetine (Prozac) capsules due to the capsules being cheaper than the tablets since patient does not have insurance.  Fluoxetine (Prozac) 20 mg.  Thanks.

## 2017-08-14 NOTE — Telephone Encounter (Signed)
That would be fine to go ahead and switch to the cheaper version

## 2017-08-15 LAB — SPECIMEN STATUS REPORT

## 2017-08-15 LAB — URINE CULTURE

## 2017-08-21 ENCOUNTER — Other Ambulatory Visit: Payer: Self-pay | Admitting: Family Medicine

## 2017-08-21 NOTE — Telephone Encounter (Signed)
This medication can cause sedation.  Please discuss with patient let her know that I only recommend using this occasionally not on a frequent basis.  Please find out from the patient how often is she actually taking this medicine

## 2017-08-22 NOTE — Telephone Encounter (Signed)
Change rx to 1 bid prn #60 with 2 refills

## 2017-08-22 NOTE — Telephone Encounter (Signed)
Patient states she takes one to two times per day

## 2017-09-03 ENCOUNTER — Encounter: Payer: Self-pay | Admitting: Family Medicine

## 2017-09-03 NOTE — Telephone Encounter (Signed)
Nurses-try oxybutynin 5 mg plain tablet, take 1 tablet twice daily, #60, 2 refills, inform patient if this is not dramatically helping next step would be follow-up

## 2017-09-04 ENCOUNTER — Other Ambulatory Visit: Payer: Self-pay

## 2017-09-04 ENCOUNTER — Other Ambulatory Visit: Payer: Self-pay | Admitting: Family Medicine

## 2017-09-04 MED ORDER — OXYBUTYNIN CHLORIDE ER 5 MG PO TB24: ORAL_TABLET | ORAL | 2 refills | Status: DC

## 2017-09-04 MED ORDER — OXYBUTYNIN CHLORIDE ER 5 MG PO TB24: ORAL_TABLET | ORAL | 0 refills | Status: DC

## 2017-09-09 ENCOUNTER — Encounter: Payer: Self-pay | Admitting: Family Medicine

## 2017-09-24 ENCOUNTER — Telehealth: Payer: Self-pay | Admitting: Family Medicine

## 2017-09-24 NOTE — Telephone Encounter (Signed)
Consult with dr Nicki Reaper. Pt needs to go to ED if she is having pain.  Left message on voicemail for pt to return call.

## 2017-09-24 NOTE — Telephone Encounter (Signed)
Pt returned call and was informed to go to ED. Pt verbalized understanding.

## 2017-09-24 NOTE — Telephone Encounter (Signed)
Pt called states in a lot of pain, pain with urination, history of kidney stones  Possible kidney stone, next available is 4:20pm - please advise OV or ER  Please call pt [phone]

## 2018-01-09 ENCOUNTER — Ambulatory Visit: Payer: Self-pay | Admitting: Family Medicine

## 2019-07-30 ENCOUNTER — Ambulatory Visit: Payer: Self-pay | Attending: Internal Medicine

## 2019-07-30 ENCOUNTER — Other Ambulatory Visit: Payer: Self-pay

## 2019-07-30 DIAGNOSIS — Z20822 Contact with and (suspected) exposure to covid-19: Secondary | ICD-10-CM | POA: Insufficient documentation

## 2019-07-31 ENCOUNTER — Telehealth: Payer: Self-pay | Admitting: General Practice

## 2019-07-31 LAB — NOVEL CORONAVIRUS, NAA: SARS-CoV-2, NAA: NOT DETECTED

## 2019-07-31 NOTE — Telephone Encounter (Signed)
Pt aware covid lab test negative, not detected °

## 2020-01-01 ENCOUNTER — Encounter (HOSPITAL_COMMUNITY): Payer: Self-pay | Admitting: *Deleted

## 2020-01-01 ENCOUNTER — Other Ambulatory Visit: Payer: Self-pay

## 2020-01-01 ENCOUNTER — Emergency Department (HOSPITAL_COMMUNITY)
Admission: EM | Admit: 2020-01-01 | Discharge: 2020-01-01 | Disposition: A | Discharge status: Home / Self Care | Payer: Self-pay | Attending: Emergency Medicine | Admitting: Emergency Medicine

## 2020-01-01 DIAGNOSIS — I10 Essential (primary) hypertension: Secondary | ICD-10-CM | POA: Insufficient documentation

## 2020-01-01 DIAGNOSIS — E860 Dehydration: Secondary | ICD-10-CM

## 2020-01-01 DIAGNOSIS — R55 Syncope and collapse: Secondary | ICD-10-CM | POA: Insufficient documentation

## 2020-01-01 DIAGNOSIS — E119 Type 2 diabetes mellitus without complications: Secondary | ICD-10-CM | POA: Insufficient documentation

## 2020-01-01 DIAGNOSIS — Z7984 Long term (current) use of oral hypoglycemic drugs: Secondary | ICD-10-CM | POA: Insufficient documentation

## 2020-01-01 DIAGNOSIS — J45901 Unspecified asthma with (acute) exacerbation: Secondary | ICD-10-CM | POA: Insufficient documentation

## 2020-01-01 DIAGNOSIS — Z79899 Other long term (current) drug therapy: Secondary | ICD-10-CM | POA: Insufficient documentation

## 2020-01-01 DIAGNOSIS — R42 Dizziness and giddiness: Secondary | ICD-10-CM | POA: Insufficient documentation

## 2020-01-01 LAB — BASIC METABOLIC PANEL
Anion gap: 11 (ref 5–15)
BUN: 16 mg/dL (ref 6–20)
CO2: 29 mmol/L (ref 22–32)
Calcium: 9.4 mg/dL (ref 8.9–10.3)
Chloride: 94 mmol/L — ABNORMAL LOW (ref 98–111)
Creatinine, Ser: 0.79 mg/dL (ref 0.44–1.00)
GFR calc Af Amer: >60 mL/min (ref >60)
GFR calc non Af Amer: >60 mL/min (ref >60)
Glucose, Bld: 265 mg/dL — ABNORMAL HIGH (ref 70–99)
Potassium: 3.4 mmol/L — ABNORMAL LOW (ref 3.5–5.1)
Sodium: 134 mmol/L — ABNORMAL LOW (ref 135–145)

## 2020-01-01 LAB — URINALYSIS, ROUTINE W REFLEX MICROSCOPIC
Bilirubin Urine: NEGATIVE
Glucose, UA: 50 mg/dL — AB
Ketones, ur: NEGATIVE mg/dL
Nitrite: NEGATIVE
Protein, ur: NEGATIVE mg/dL
Specific Gravity, Urine: 1.017 (ref 1.005–1.030)
pH: 5 (ref 5.0–8.0)

## 2020-01-01 LAB — CBC
HCT: 41.6 % (ref 36.0–46.0)
Hemoglobin: 13.5 g/dL (ref 12.0–15.0)
MCH: 29.6 pg (ref 26.0–34.0)
MCHC: 32.5 g/dL (ref 30.0–36.0)
MCV: 91.2 fL (ref 80.0–100.0)
Platelets: 177 10*3/uL (ref 150–400)
RBC: 4.56 MIL/uL (ref 3.87–5.11)
RDW: 12.4 % (ref 11.5–15.5)
WBC: 6.2 10*3/uL (ref 4.0–10.5)
nRBC: 0 % (ref 0.0–0.2)

## 2020-01-01 LAB — HEPATIC FUNCTION PANEL
ALT: 70 U/L — ABNORMAL HIGH (ref 0–44)
AST: 55 U/L — ABNORMAL HIGH (ref 15–41)
Albumin: 4.2 g/dL (ref 3.5–5.0)
Alkaline Phosphatase: 74 U/L (ref 38–126)
Bilirubin, Direct: 0.1 mg/dL (ref 0.0–0.2)
Indirect Bilirubin: 0.6 mg/dL (ref 0.3–0.9)
Total Bilirubin: 0.7 mg/dL (ref 0.3–1.2)
Total Protein: 7.4 g/dL (ref 6.5–8.1)

## 2020-01-01 LAB — PREGNANCY, URINE: Preg Test, Ur: NEGATIVE

## 2020-01-01 LAB — CBG MONITORING, ED: Glucose-Capillary: 270 mg/dL — ABNORMAL HIGH (ref 70–99)

## 2020-01-01 MED ORDER — METFORMIN HCL 500 MG PO TABS
500.0000 mg | ORAL_TABLET | Freq: Once | ORAL | Status: AC
  Administered 2020-01-01: 500 mg via ORAL
  Filled 2020-01-01: qty 1

## 2020-01-01 MED ORDER — METFORMIN HCL 500 MG PO TABS
500.0000 mg | ORAL_TABLET | Freq: Two times a day (BID) | ORAL | 1 refills | Status: DC
Start: 2020-01-01 — End: 2022-07-16

## 2020-01-01 MED ORDER — SODIUM CHLORIDE 0.9 % IV BOLUS
1000.0000 mL | Freq: Once | INTRAVENOUS | Status: AC
  Administered 2020-01-01: 1000 mL via INTRAVENOUS

## 2020-01-01 NOTE — ED Triage Notes (Addendum)
Pt reports not feeling well for the past 2 weeks, especially the last 2 days. Pt took her blood sugar this morning and realized it was 350. She reports she has never been diagnosed with diabetes and she just randomly had a blood sugar machine. Pt reports polyuria, polydipsia, headache, weakness, dry mouth, nausea. Pt also reports she had a syncopal episode yesterday and "blacked out". Pt denies use of blood thinners and reports bruising to left side.

## 2020-01-01 NOTE — Discharge Instructions (Signed)
Your testing today shows that you likely have diabetes.  Your blood work was otherwise reassuring and we want to start you on a medication called Metformin which you take twice a day.  You will need to let your doctor know this week and have a recheck in the office.  They will need to follow your blood work very carefully  Return to the emergency department for severe or worsening symptoms, drink plenty of clear liquids

## 2020-01-01 NOTE — ED Provider Notes (Signed)
[facility] EMERGENCY DEPARTMENT Provider Note   CSN: [id number] Arrival date & time: 01/01/20  0741     History Chief Complaint  Patient presents with   Hyperglycemia   Loss of Consciousness    Andrea [name] is a 39 y.o. female.  HPI   39 y/o female - hx of Bipolar d/c -  C.o syncope - on lisinopril Yesterday had syncope in the bathroom - no head injury 2 weeks of feeling light headed / dizzy / nuaseated / headache and blurred vision - last night CBG was 328, rechecked at 355 this morning no hx of DM. She has noticed some increased freq urination - some numbness / hands bilaterally, She has FHx of DM/ pre DM.  Syncope was acute in osnet - resolved spontaneously - not associated with fevers / vomiting / diarrhea.   The patient reports that she is gradually become more thirsty, has been urinating more frequently and last night when she went to the bathroom after getting out of bed too quickly she stood up felt lightheaded and had a syncopal episode in the bathroom.  On the floor she was little bit pale, sweaty, this improvement at this time she feels better.  She was treated for PCOS, had a hysterectomy, no other major medical history other than lisinopril and some antipsychiatric medications times many     Past Medical History:  Diagnosis Date   Anxiety    Bipolar 1 disorder ([place])    Complication of anesthesia    hard time waking up    Depression    GERD (gastroesophageal reflux disease)    no meds   Kidney stones    Low back pain    Migraines    PCOS (polycystic ovarian syndrome)    PONV (postoperative nausea and vomiting)    Schizophrenia ([place])    Shortness of breath dyspnea    with bronchitis    Patient Active Problem List   Diagnosis Date Noted   Mild persistent asthma with acute exacerbation 06/18/2017   Bipolar I disorder, most recent episode depressed ([place]) 12/20/2016   Bipolar 1 disorder, mixed, severe ([place]) 12/20/2016   Type 2  diabetes mellitus with hyperglycemia ([place]) 09/18/2016   HTN (hypertension) 09/18/2016   Seizures ([place]) 06/04/2016   Pelvic pain in female 11/14/2015   Acute pyelonephritis 01/16/2013   Migraines 10/30/2012   Morbid obesity ([place]) 10/30/2012   Depression with anxiety 10/30/2012   Kidney stones     Past Surgical History:  Procedure Laterality Date   ABDOMINAL HYSTERECTOMY N/A 11/14/2015   Procedure: HYSTERECTOMY ABDOMINAL;  Surgeon: Olga Millers, MD;  Location: [facility];  Service: Gynecology;  Laterality: N/A;   CHOLECYSTECTOMY     INTRAUTERINE DEVICE (IUD) INSERTION  06/14/2014   [facility]   LYMPH NODES REMOVED     ovarian cyst removed     SALPINGOOPHORECTOMY Bilateral 11/14/2015   Procedure: SALPINGO OOPHORECTOMY;  Surgeon: Olga Millers, MD;  Location: [facility];  Service: Gynecology;  Laterality: Bilateral;     OB History   No obstetric history on file.     No family history on file.  Social History   Tobacco Use   Smoking status: Never Smoker   Smokeless tobacco: Never Used  Vaping Use   Vaping Use: Never used  Substance Use Topics   Alcohol use: No   Drug use: No    Home Medications Prior to Admission medications   Medication Sig Start Date End Date Taking? Authorizing Provider  FLUoxetine (PROZAC) 20 MG capsule Take 1 capsule (20 mg total) by mouth daily. Patient taking differently: Take 60 mg by mouth daily.  08/14/17  Yes Luking, Elayne Snare, MD  fluticasone (FLOVENT HFA) 110 MCG/ACT inhaler Inhale 2 puffs into the lungs 2 (two) times daily. For shortness of breath 12/24/16  Yes Nwoko, Herbert Pun I, NP  gabapentin (NEURONTIN) 100 MG capsule Take 100 mg by mouth 4 (four) times daily.   Yes [provider]  lisinopril-hydrochlorothiazide (ZESTORETIC) 20-25 MG tablet Take 1 tablet by mouth daily.   Yes [provider]  OLANZapine (ZYPREXA) 10 MG tablet Take 10 mg by mouth in the morning and at bedtime.   Yes [provider]  oxybutynin (DITROPAN-XL) 5 MG 24 hr tablet Take one tablet twice daily 09/04/17  Yes Luking, Scott A, MD  prazosin (MINIPRESS) 2 MG capsule Take 2 mg by mouth 2 (two) times daily.   Yes [provider]  tiZANidine (ZANAFLEX) 4 MG tablet Take 4 mg by mouth every 8 (eight) hours as needed for muscle spasms.   Yes [provider]  trazodone (DESYREL) 300 MG tablet Take 300 mg by mouth at bedtime.   Yes [provider]  metFORMIN (GLUCOPHAGE) 500 MG tablet Take 1 tablet (500 mg total) by mouth 2 (two) times daily with a meal. 01/01/20   Noemi Chapel, MD    Allergies    Bactrim [sulfamethoxazole-trimethoprim] and Vicodin [hydrocodone-acetaminophen]  Review of Systems   Review of Systems  All other systems reviewed and are negative.   Physical Exam Updated Vital Signs BP 102/75 (BP Location: Right Arm)    Pulse 82    Temp 97.6 F (36.4 C) (Oral)    Resp 18    Ht 1.829 m (6')    Wt (!) 138.3 kg    LMP  (LMP Unknown)    SpO2 96%    BMI 41.37 kg/m   Physical Exam Vitals and nursing note reviewed.  Constitutional:      General: She is not in acute distress.    Appearance: She is well-developed.  HENT:     Head: Normocephalic and atraumatic.     Mouth/Throat:     Pharynx: No oropharyngeal exudate.  Eyes:     General: No scleral icterus.       Right eye: No discharge.        Left eye: No discharge.     Conjunctiva/sclera: Conjunctivae normal.     Pupils: Pupils are equal, round, and reactive to light.  Neck:     Thyroid: No thyromegaly.     Vascular: No JVD.  Cardiovascular:     Rate and Rhythm: Normal rate and regular rhythm.     Heart sounds: Normal heart sounds. No murmur heard.  No friction rub. No gallop.   Pulmonary:     Effort: Pulmonary effort is normal. No respiratory distress.     Breath sounds: Normal breath sounds. No wheezing or rales.  Abdominal:     General: Bowel sounds are normal. There is no distension.     Palpations: Abdomen is  soft. There is no mass.     Tenderness: There is no abdominal tenderness.  Musculoskeletal:        General: No tenderness. Normal range of motion.     Cervical back: Normal range of motion and neck supple.  Lymphadenopathy:     Cervical: No cervical adenopathy.  Skin:    General: Skin is warm and dry.  Findings: No erythema or rash.  Neurological:     Mental Status: She is alert.     Coordination: Coordination normal.  Psychiatric:        Behavior: Behavior normal.     ED Results / Procedures / Treatments   Labs (all labs ordered are listed, but only abnormal results are displayed) Labs Reviewed  BASIC METABOLIC PANEL - Abnormal; Notable for the following components:      Result Value   Sodium 134 (*)    Potassium 3.4 (*)    Chloride 94 (*)    Glucose, Bld 265 (*)    All other components within normal limits  URINALYSIS, ROUTINE W REFLEX MICROSCOPIC - Abnormal; Notable for the following components:   Glucose, UA 50 (*)    Hgb urine dipstick SMALL (*)    Leukocytes,Ua SMALL (*)    Bacteria, UA RARE (*)    All other components within normal limits  HEPATIC FUNCTION PANEL - Abnormal; Notable for the following components:   AST 55 (*)    ALT 70 (*)    All other components within normal limits  CBG MONITORING, ED - Abnormal; Notable for the following components:   Glucose-Capillary 270 (*)    All other components within normal limits  CBC  PREGNANCY, URINE  CBG MONITORING, ED    EKG EKG Interpretation  Date/Time:  Saturday January 01 2020 08:17:33 EDT Ventricular Rate:  91 PR Interval:  162 QRS Duration: 86 QT Interval:  392 QTC Calculation: 482 R Axis:   75 Text Interpretation: Normal sinus rhythm Cannot rule out Anterior infarct , age undetermined Abnormal ECG Confirmed by Noemi Chapel [phone]) on 01/01/2020 9:28:20 AM   Radiology No results found.  Procedures Procedures (including critical care time)  Medications Ordered in ED Medications  metFORMIN  (GLUCOPHAGE) tablet 500 mg (500 mg Oral Given 01/01/20 1015)  sodium chloride 0.9 % bolus 1,000 mL (1,000 mLs Intravenous New Bag/Given 01/01/20 1016)    ED Course  I have reviewed the triage vital signs and the nursing notes.  Pertinent labs & imaging results that were available during my care of the patient were reviewed by me and considered in my medical decision making (see chart for details).    MDM Rules/Calculators/A&P                          Though this patient's examination is unremarkable her vital signs are reassuring she definitely has signs of hyperglycemia, this was a fasting blood sugar this morning, she still has not eaten and it measures 270.  She will need a metabolic panel to rule out DKA though I suspect this is just new onset diabetes.  She will be able to start medications, will add liver function test to make sure she can tolerate the medications well, check urinalysis, stable at this time  Orthostatic vital signs reveal a drop in blood pressure and a rise in heart rate consistent with dehydration.  Labs show no signs of anion gap acidosis or ketonuria, this patient has blood work which should be reassuring to show that the patient should be able to take Metformin, she has been prescribed this, her vital signs are stable, she was given IV fluids, she has follow-up in the outpatient setting, stable for discharge  Final Clinical Impression(s) / ED Diagnoses Final diagnoses:  Syncope, unspecified syncope type  Dehydration  Diabetes mellitus, new onset ([place])    Rx / DC Orders ED Discharge  Orders         Ordered    metFORMIN (GLUCOPHAGE) 500 MG tablet  2 times daily with meals     Discontinue  Reprint     01/01/20 1122           Noemi Chapel, MD 01/01/20 1124

## 2020-02-02 ENCOUNTER — Other Ambulatory Visit: Payer: Self-pay

## 2020-02-02 ENCOUNTER — Emergency Department (HOSPITAL_COMMUNITY)
Admission: EM | Admit: 2020-02-02 | Discharge: 2020-02-02 | Disposition: A | Discharge status: Home / Self Care | Payer: Self-pay | Attending: Emergency Medicine | Admitting: Emergency Medicine

## 2020-02-02 ENCOUNTER — Encounter (HOSPITAL_COMMUNITY): Payer: Self-pay | Admitting: Emergency Medicine

## 2020-02-02 DIAGNOSIS — Z7951 Long term (current) use of inhaled steroids: Secondary | ICD-10-CM | POA: Insufficient documentation

## 2020-02-02 DIAGNOSIS — M5431 Sciatica, right side: Secondary | ICD-10-CM | POA: Insufficient documentation

## 2020-02-02 DIAGNOSIS — E1165 Type 2 diabetes mellitus with hyperglycemia: Secondary | ICD-10-CM | POA: Insufficient documentation

## 2020-02-02 DIAGNOSIS — Z7984 Long term (current) use of oral hypoglycemic drugs: Secondary | ICD-10-CM | POA: Insufficient documentation

## 2020-02-02 DIAGNOSIS — J45909 Unspecified asthma, uncomplicated: Secondary | ICD-10-CM | POA: Insufficient documentation

## 2020-02-02 DIAGNOSIS — I10 Essential (primary) hypertension: Secondary | ICD-10-CM | POA: Insufficient documentation

## 2020-02-02 MED ORDER — METHOCARBAMOL 500 MG PO TABS
500.0000 mg | ORAL_TABLET | Freq: Three times a day (TID) | ORAL | 0 refills | Status: DC | PRN
Start: 2020-02-02 — End: 2020-10-27

## 2020-02-02 MED ORDER — KETOROLAC TROMETHAMINE 60 MG/2ML IM SOLN
60.0000 mg | Freq: Once | INTRAMUSCULAR | Status: AC
  Administered 2020-02-02: 60 mg via INTRAMUSCULAR
  Filled 2020-02-02: qty 2

## 2020-02-02 MED ORDER — DEXAMETHASONE SODIUM PHOSPHATE 10 MG/ML IJ SOLN
10.0000 mg | Freq: Once | INTRAMUSCULAR | Status: AC
  Administered 2020-02-02: 10 mg via INTRAMUSCULAR
  Filled 2020-02-02: qty 1

## 2020-02-02 MED ORDER — IBUPROFEN 800 MG PO TABS
800.0000 mg | ORAL_TABLET | Freq: Four times a day (QID) | ORAL | 0 refills | Status: DC | PRN
Start: 2020-02-02 — End: 2021-10-03

## 2020-02-02 NOTE — ED Triage Notes (Signed)
Pt c/o right lower back pain that radiates down the back of her leg.

## 2020-02-02 NOTE — ED Provider Notes (Signed)
[facility] EMERGENCY DEPARTMENT Provider Note   CSN: [id number] Arrival date & time: 02/02/20  5784     History Chief Complaint  Patient presents with  . Back Pain    Andrea [name] is a 39 y.o. female.  Patient presents to the emergency department for evaluation of back pain. Patient reports that she has been having pain in her right lower back, radiating down the right leg for 2 days. She denies any direct injury. She reports that she woke up with a "knot" in the lower back that has worsened. Pain is worse if she tries to sit down. She has not had any change in bowel or bladder function. She denies numbness, tingling or weakness in lower extremities.        Past Medical History:  Diagnosis Date  . Anxiety   . Bipolar 1 disorder ([place])   . Complication of anesthesia    hard time waking up   . Depression   . GERD (gastroesophageal reflux disease)    no meds  . Kidney stones   . Low back pain   . Migraines   . PCOS (polycystic ovarian syndrome)   . PONV (postoperative nausea and vomiting)   . Schizophrenia ([place])   . Shortness of breath dyspnea    with bronchitis    Patient Active Problem List   Diagnosis Date Noted  . Mild persistent asthma with acute exacerbation 06/18/2017  . Bipolar I disorder, most recent episode depressed ([place]) 12/20/2016  . Bipolar 1 disorder, mixed, severe ([place]) 12/20/2016  . Type 2 diabetes mellitus with hyperglycemia ([place]) 09/18/2016  . HTN (hypertension) 09/18/2016  . Seizures ([place] 06/04/2016  . Pelvic pain in female 11/14/2015  . Acute pyelonephritis 01/16/2013  . Migraines 10/30/2012  . Morbid obesity ([place]) 10/30/2012  . Depression with anxiety 10/30/2012  . Kidney stones     Past Surgical History:  Procedure Laterality Date  . ABDOMINAL HYSTERECTOMY N/A 11/14/2015   Procedure: HYSTERECTOMY ABDOMINAL;  Surgeon: Olga Millers, MD;  Location: [facility];  Service: Gynecology;  Laterality: N/A;  . CHOLECYSTECTOMY    .  INTRAUTERINE DEVICE (IUD) INSERTION  06/14/2014   [facility]  . LYMPH NODES REMOVED    . ovarian cyst removed    . SALPINGOOPHORECTOMY Bilateral 11/14/2015   Procedure: SALPINGO OOPHORECTOMY;  Surgeon: Olga Millers, MD;  Location: [facility];  Service: Gynecology;  Laterality: Bilateral;     OB History   No obstetric history on file.     History reviewed. No pertinent family history.  Social History   Tobacco Use  . Smoking status: Never Smoker  . Smokeless tobacco: Never Used  Vaping Use  . Vaping Use: Never used  Substance Use Topics  . Alcohol use: No  . Drug use: No    Home Medications Prior to Admission medications   Medication Sig Start Date End Date Taking? Authorizing Provider  FLUoxetine (PROZAC) 20 MG capsule Take 1 capsule (20 mg total) by mouth daily. Patient taking differently: Take 60 mg by mouth daily.  08/14/17   Kathyrn Drown, MD  fluticasone (FLOVENT HFA) 110 MCG/ACT inhaler Inhale 2 puffs into the lungs 2 (two) times daily. For shortness of breath 12/24/16   Lindell Spar I, NP  gabapentin (NEURONTIN) 100 MG capsule Take 100 mg by mouth 4 (four) times daily.    [provider]  ibuprofen (ADVIL) 800 MG tablet Take 1 tablet (800 mg total) by mouth every 6 (six) hours as needed  for moderate pain. 02/02/20   Orpah Greek, MD  lisinopril-hydrochlorothiazide (ZESTORETIC) 20-25 MG tablet Take 1 tablet by mouth daily.    [provider]  metFORMIN (GLUCOPHAGE) 500 MG tablet Take 1 tablet (500 mg total) by mouth 2 (two) times daily with a meal. 01/01/20   Noemi Chapel, MD  methocarbamol (ROBAXIN) 500 MG tablet Take 1 tablet (500 mg total) by mouth every 8 (eight) hours as needed for muscle spasms. 02/02/20   Orpah Greek, MD  OLANZapine (ZYPREXA) 10 MG tablet Take 10 mg by mouth in the morning and at bedtime.    [provider]  oxybutynin (DITROPAN-XL) 5 MG 24 hr tablet Take one tablet twice daily 09/04/17   Kathyrn Drown, MD  prazosin (MINIPRESS) 2 MG capsule Take 2 mg by mouth 2 (two) times daily.    [provider]  tiZANidine (ZANAFLEX) 4 MG tablet Take 4 mg by mouth every 8 (eight) hours as needed for muscle spasms.    [provider]  trazodone (DESYREL) 300 MG tablet Take 300 mg by mouth at bedtime.    [provider]    Allergies    Bactrim [sulfamethoxazole-trimethoprim] and Vicodin [hydrocodone-acetaminophen]  Review of Systems   Review of Systems  Musculoskeletal: Positive for back pain.  All other systems reviewed and are negative.   Physical Exam Updated Vital Signs BP 129/84 (BP Location: Right Arm)   Pulse 71   Temp 98 F (36.7 C) (Oral)   Resp 17   Ht 6' (1.829 m)   Wt 115.7 kg   LMP  (LMP Unknown)   SpO2 100%   BMI 34.58 kg/m   Physical Exam Vitals and nursing note reviewed.  Constitutional:      General: She is not in acute distress.    Appearance: Normal appearance. She is well-developed.  HENT:     Head: Normocephalic and atraumatic.     Right Ear: Hearing normal.     Left Ear: Hearing normal.     Nose: Nose normal.  Eyes:     Conjunctiva/sclera: Conjunctivae normal.     Pupils: Pupils are equal, round, and reactive to light.  Cardiovascular:     Rate and Rhythm: Regular rhythm.     Heart sounds: S1 normal and S2 normal. No murmur heard.  No friction rub. No gallop.   Pulmonary:     Effort: Pulmonary effort is normal. No respiratory distress.     Breath sounds: Normal breath sounds.  Chest:     Chest wall: No tenderness.  Abdominal:     General: Bowel sounds are normal.     Palpations: Abdomen is soft.     Tenderness: There is no abdominal tenderness. There is no guarding or rebound. Negative signs include Murphy's sign and McBurney's sign.     Hernia: No hernia is present.  Musculoskeletal:     Cervical back: Normal range of motion and neck supple.     Thoracic back: Normal.     Lumbar back: No tenderness or bony  tenderness. Positive left straight leg raise test (at 45 degrees).  Skin:    General: Skin is warm and dry.     Findings: No rash.  Neurological:     Mental Status: She is alert and oriented to person, place, and time.     GCS: GCS eye subscore is 4. GCS verbal subscore is 5. GCS motor subscore is 6.     Cranial Nerves: No cranial nerve deficit.  Sensory: No sensory deficit.     Coordination: Coordination normal.     Comments: Normal flexion extension at hip, knee, ankle.  Normal sensation of bilateral lower extremities.  No saddle anesthesia, no foot drop  Psychiatric:        Speech: Speech normal.        Behavior: Behavior normal.        Thought Content: Thought content normal.     ED Results / Procedures / Treatments   Labs (all labs ordered are listed, but only abnormal results are displayed) Labs Reviewed - No data to display  EKG None  Radiology No results found.  Procedures Procedures (including critical care time)  Medications Ordered in ED Medications  ketorolac (TORADOL) injection 60 mg (has no administration in time range)  dexamethasone (DECADRON) injection 10 mg (has no administration in time range)    ED Course  I have reviewed the triage vital signs and the nursing notes.  Pertinent labs & imaging results that were available during my care of the patient were reviewed by me and considered in my medical decision making (see chart for details).    MDM Rules/Calculators/A&P                          Patient presents to the ER with musculoskeletal back pain. Examination reveals back tenderness without any associated neurologic findings. Patient's strength, sensation and reflexes were normal. There is no evidence of saddle anesthesia. Patient does not have a foot drop. Patient has not experienced any change in bowel or bladder function. As such, patient did not require any imaging or further studies. Patient was treated with analgesia.  Final Clinical  Impression(s) / ED Diagnoses Final diagnoses:  Sciatica of right side    Rx / DC Orders ED Discharge Orders         Ordered    ibuprofen (ADVIL) 800 MG tablet  Every 6 hours PRN     Discontinue  Reprint     02/02/20 0416    methocarbamol (ROBAXIN) 500 MG tablet  Every 8 hours PRN     Discontinue  Reprint     02/02/20 0416           Orpah Greek, MD 02/02/20 [phone]

## 2020-09-26 ENCOUNTER — Encounter (HOSPITAL_COMMUNITY): Payer: Self-pay | Admitting: Emergency Medicine

## 2020-09-26 ENCOUNTER — Emergency Department (HOSPITAL_COMMUNITY)
Admission: EM | Admit: 2020-09-26 | Discharge: 2020-09-26 | Disposition: A | Discharge status: Home / Self Care | Payer: No Typology Code available for payment source | Attending: Emergency Medicine | Admitting: Emergency Medicine

## 2020-09-26 ENCOUNTER — Other Ambulatory Visit: Payer: Self-pay

## 2020-09-26 DIAGNOSIS — I10 Essential (primary) hypertension: Secondary | ICD-10-CM | POA: Diagnosis not present

## 2020-09-26 DIAGNOSIS — Z79899 Other long term (current) drug therapy: Secondary | ICD-10-CM | POA: Insufficient documentation

## 2020-09-26 DIAGNOSIS — J4521 Mild intermittent asthma with (acute) exacerbation: Secondary | ICD-10-CM | POA: Insufficient documentation

## 2020-09-26 DIAGNOSIS — A059 Bacterial foodborne intoxication, unspecified: Secondary | ICD-10-CM

## 2020-09-26 DIAGNOSIS — R112 Nausea with vomiting, unspecified: Secondary | ICD-10-CM | POA: Diagnosis present

## 2020-09-26 DIAGNOSIS — T6291XA Toxic effect of unspecified noxious substance eaten as food, accidental (unintentional), initial encounter: Secondary | ICD-10-CM | POA: Insufficient documentation

## 2020-09-26 DIAGNOSIS — E1165 Type 2 diabetes mellitus with hyperglycemia: Secondary | ICD-10-CM | POA: Diagnosis not present

## 2020-09-26 DIAGNOSIS — Z7984 Long term (current) use of oral hypoglycemic drugs: Secondary | ICD-10-CM | POA: Insufficient documentation

## 2020-09-26 DIAGNOSIS — N926 Irregular menstruation, unspecified: Secondary | ICD-10-CM | POA: Insufficient documentation

## 2020-09-26 HISTORY — DX: Type 2 diabetes mellitus without complications: E11.9

## 2020-09-26 LAB — CBC WITH DIFFERENTIAL/PLATELET
Abs Immature Granulocytes: 0.02 10*3/uL (ref 0.00–0.07)
Basophils Absolute: 0 10*3/uL (ref 0.0–0.1)
Basophils Relative: 1 %
Eosinophils Absolute: 0.2 10*3/uL (ref 0.0–0.5)
Eosinophils Relative: 3 %
HCT: 40.8 % (ref 36.0–46.0)
Hemoglobin: 13.2 g/dL (ref 12.0–15.0)
Immature Granulocytes: 0 %
Lymphocytes Relative: 34 %
Lymphs Abs: 2.5 10*3/uL (ref 0.7–4.0)
MCH: 30.6 pg (ref 26.0–34.0)
MCHC: 32.4 g/dL (ref 30.0–36.0)
MCV: 94.7 fL (ref 80.0–100.0)
Monocytes Absolute: 0.5 10*3/uL (ref 0.1–1.0)
Monocytes Relative: 6 %
Neutro Abs: 4.2 10*3/uL (ref 1.7–7.7)
Neutrophils Relative %: 56 %
Platelets: 206 10*3/uL (ref 150–400)
RBC: 4.31 MIL/uL (ref 3.87–5.11)
RDW: 12.7 % (ref 11.5–15.5)
WBC: 7.5 10*3/uL (ref 4.0–10.5)
nRBC: 0 % (ref 0.0–0.2)

## 2020-09-26 LAB — COMPREHENSIVE METABOLIC PANEL
ALT: 58 U/L — ABNORMAL HIGH (ref 0–44)
AST: 66 U/L — ABNORMAL HIGH (ref 15–41)
Albumin: 4.5 g/dL (ref 3.5–5.0)
Alkaline Phosphatase: 73 U/L (ref 38–126)
Anion gap: 14 (ref 5–15)
BUN: 15 mg/dL (ref 6–20)
CO2: 24 mmol/L (ref 22–32)
Calcium: 9.3 mg/dL (ref 8.9–10.3)
Chloride: 98 mmol/L (ref 98–111)
Creatinine, Ser: 0.89 mg/dL (ref 0.44–1.00)
GFR, Estimated: >60 mL/min (ref >60)
Glucose, Bld: 188 mg/dL — ABNORMAL HIGH (ref 70–99)
Potassium: 3.3 mmol/L — ABNORMAL LOW (ref 3.5–5.1)
Sodium: 136 mmol/L (ref 135–145)
Total Bilirubin: 0.8 mg/dL (ref 0.3–1.2)
Total Protein: 7.9 g/dL (ref 6.5–8.1)

## 2020-09-26 LAB — URINALYSIS, ROUTINE W REFLEX MICROSCOPIC
Bilirubin Urine: NEGATIVE
Glucose, UA: >=500 mg/dL — AB
Hgb urine dipstick: NEGATIVE
Ketones, ur: NEGATIVE mg/dL
Nitrite: NEGATIVE
Protein, ur: NEGATIVE mg/dL
Specific Gravity, Urine: 1.029 (ref 1.005–1.030)
pH: 6 (ref 5.0–8.0)

## 2020-09-26 LAB — BLOOD GAS, VENOUS
Acid-Base Excess: 3.6 mmol/L — ABNORMAL HIGH (ref 0.0–2.0)
Bicarbonate: 28.2 mmol/L — ABNORMAL HIGH (ref 20.0–28.0)
Drawn by: 6000
FIO2: 21
O2 Saturation: 97.9 %
Patient temperature: 37
pCO2, Ven: 33.5 mmHg — ABNORMAL LOW (ref 44.0–60.0)
pH, Ven: 7.512 — ABNORMAL HIGH (ref 7.250–7.430)
pO2, Ven: 89.8 mmHg — ABNORMAL HIGH (ref 32.0–45.0)

## 2020-09-26 LAB — LIPASE, BLOOD: Lipase: 51 U/L (ref 11–51)

## 2020-09-26 LAB — CBG MONITORING, ED: Glucose-Capillary: 236 mg/dL — ABNORMAL HIGH (ref 70–99)

## 2020-09-26 MED ORDER — ONDANSETRON HCL 4 MG PO TABS: 4.0000 mg | ORAL_TABLET | Freq: Three times a day (TID) | ORAL | 0 refills | Status: AC | PRN

## 2020-09-26 MED ORDER — ONDANSETRON 4 MG PO TBDP
4.0000 mg | ORAL_TABLET | Freq: Once | ORAL | Status: AC
  Administered 2020-09-26: 4 mg via ORAL
  Filled 2020-09-26: qty 1

## 2020-09-26 NOTE — ED Notes (Signed)
Nausea improved, pt is able to tolerate po fluids with nausea or vomiting.

## 2020-09-26 NOTE — ED Triage Notes (Signed)
Emergency Medicine Provider Triage Evaluation Note  Andrea [name] , a 40 y.o. female  was evaluated in triage.  Pt complains of hyperglycemia, vomiting, diarrhea.  Symptoms began last night, blood sugars running in the 300s, typically less than 150.  Multiple episodes of diarrhea last night and then started having vomiting today with intermittent abdominal pain.  No fevers.  Review of Systems  Positive: Abdominal pain, vomiting, diarrhea, hyperglycemia Negative: Fever, chest pain, shortness of breath  Physical Exam  BP 119/90 (BP Location: Right Arm)   Pulse (!) 114   Temp 98.1 F (36.7 C) (Oral)   Resp 16   LMP  (LMP Unknown)   SpO2 100%  Gen:   Awake, no distress   HEENT:  Atraumatic  Resp:  Normal effort Cardiac: Mildly tachycardic Abd:   Nondistended, mild generalized tenderness MSK:   Moves extremities without difficulty  Neuro:   Speech clear   Medical Decision Making  Medically screening exam initiated at 5:27 PM.  Appropriate orders placed.  Andrea [name] was informed that the remainder of the evaluation will be completed by another provider, this initial triage assessment does not replace that evaluation, and the importance of remaining in the ED until their evaluation is complete.  Clinical Impression  1.  Hypoglycemia 2.  Nausea, vomiting, diarrhea   Andrea [name], [place] 09/26/20 1735

## 2020-09-26 NOTE — Discharge Instructions (Addendum)
Your clinical history is consistent with food poisoning.  This usually last about 24 hours and then gets better.  Your blood test not show signs of a bad infection or dehydration.  You are able to drink some fluid here.  I discharged you with Zofran, nausea medication for home.  Stick to a light diet for the next day or 2.  Try to avoid heavy or fatty meals.  Your potassium was very mildly low today.  I gave you a list of potassium containing foods.  Try to add these to your diet moving forward.

## 2020-09-26 NOTE — ED Triage Notes (Signed)
Vomiting that started yesterday, has vomited 5-6 times in 24 hours.  Blood sugar running in the 300's .  Takes oral diabetic meds.

## 2020-09-26 NOTE — ED Notes (Signed)
Pt given ice water for fluid challenge.

## 2020-09-26 NOTE — ED Provider Notes (Signed)
[facility] EMERGENCY DEPARTMENT Provider Note   CSN: [id number] Arrival date & time: 09/26/20  1650     History Chief Complaint  Patient presents with  . Hyperglycemia    Andrea [name] is a 40 y.o. female with a history of obesity, type 2 diabetes, presenting to emergency department with nausea vomiting and diarrhea.  The patient reports that she ate a steak sandwich with fries yesterday evening at 7 PM, including mayonnaise sauce.  Around 9 PM she began having diarrhea and upset stomach.  She had multiple episodes of vomiting and loose bowel movements overnight.  Her blood sugar was quite high today, in the 300s.  She came to the ED for evaluation.  She feels better since coming.  She denies fevers or chills.  She reports a history of cholecystectomy.  No other sick contacts in the house.  She denies headache, sore throat, cough, congestion.  HPI     Past Medical History:  Diagnosis Date  . Anxiety   . Bipolar 1 disorder ([place])   . Complication of anesthesia    hard time waking up   . Depression   . Diabetes mellitus without complication ([place])   . GERD (gastroesophageal reflux disease)    no meds  . Kidney stones   . Low back pain   . Migraines   . PCOS (polycystic ovarian syndrome)   . PONV (postoperative nausea and vomiting)   . Schizophrenia ([place])   . Shortness of breath dyspnea    with bronchitis    Patient Active Problem List   Diagnosis Date Noted  . Irregular periods 09/26/2020  . Mild persistent asthma with acute exacerbation 06/18/2017  . Bipolar I disorder, most recent episode depressed ([place]) 12/20/2016  . Bipolar 1 disorder, mixed, severe ([place]) 12/20/2016  . Type 2 diabetes mellitus with hyperglycemia ([place]) 09/18/2016  . HTN (hypertension) 09/18/2016  . Seizures ([place]) 06/04/2016  . Pelvic pain in female 11/14/2015  . Acute pyelonephritis 01/16/2013  . Migraines 10/30/2012  . Morbid obesity ([place]) 10/30/2012  . Depression with anxiety 10/30/2012  .  Kidney stones     Past Surgical History:  Procedure Laterality Date  . ABDOMINAL HYSTERECTOMY N/A 11/14/2015   Procedure: HYSTERECTOMY ABDOMINAL;  Surgeon: Olga Millers, MD;  Location: [facility];  Service: Gynecology;  Laterality: N/A;  . CHOLECYSTECTOMY    . INTRAUTERINE DEVICE (IUD) INSERTION  06/14/2014   [facility]  . LYMPH NODES REMOVED    . ovarian cyst removed    . SALPINGOOPHORECTOMY Bilateral 11/14/2015   Procedure: SALPINGO OOPHORECTOMY;  Surgeon: Olga Millers, MD;  Location: [facility];  Service: Gynecology;  Laterality: Bilateral;     OB History   No obstetric history on file.     History reviewed. No pertinent family history.  Social History   Tobacco Use  . Smoking status: Never Smoker  . Smokeless tobacco: Never Used  Vaping Use  . Vaping Use: Never used  Substance Use Topics  . Alcohol use: No  . Drug use: No    Home Medications Prior to Admission medications   Medication Sig Start Date End Date Taking? Authorizing Provider  doxepin (SINEQUAN) 25 MG capsule Take 50 mg by mouth at bedtime. 09/05/20  Yes [provider]  FLUoxetine (PROZAC) 20 MG capsule Take 1 capsule (20 mg total) by mouth daily. Patient taking differently: Take 60 mg by mouth daily. 08/14/17  Yes Kathyrn Drown, MD  gabapentin (NEURONTIN) 300 MG capsule Take 1 capsule  by mouth 2 (two) times daily. 09/05/20  Yes [provider]  HYDROcodone-acetaminophen (NORCO) 7.5-325 MG tablet Take 1 tablet by mouth every 6 (six) hours as needed. for pain 07/31/20  Yes [provider]  ibuprofen (ADVIL) 800 MG tablet Take 1 tablet (800 mg total) by mouth every 6 (six) hours as needed for moderate pain. 02/02/20  Yes Pollina, Gwenyth Allegra, MD  JARDIANCE 10 MG TABS tablet Take 10 mg by mouth daily. 09/11/20  Yes [provider]  lisinopril-hydrochlorothiazide (ZESTORETIC) 20-25 MG tablet Take 1 tablet by mouth daily.   Yes [provider]  metoprolol  succinate (TOPROL-XL) 25 MG 24 hr tablet Take 1 tablet by mouth daily. 08/17/20  Yes [provider]  OLANZapine (ZYPREXA) 10 MG tablet Take 10 mg by mouth in the morning and at bedtime.   Yes [provider]  ondansetron (ZOFRAN) 4 MG tablet Take 1 tablet (4 mg total) by mouth every 8 (eight) hours as needed for up to 15 days for nausea or vomiting. 09/26/20 10/11/20 Yes Cieanna Stormes, Carola Rhine, MD  oxybutynin (DITROPAN-XL) 5 MG 24 hr tablet Take one tablet twice daily 09/04/17  Yes Luking, Scott A, MD  pantoprazole (PROTONIX) 40 MG tablet Take 1 tablet by mouth daily. 08/31/20  Yes [provider]  prazosin (MINIPRESS) 2 MG capsule Take 2 mg by mouth 2 (two) times daily.   Yes [provider]  RYBELSUS 7 MG TABS Take 1 tablet by mouth daily. 09/17/20  Yes [provider]  tiZANidine (ZANAFLEX) 4 MG tablet Take 4 mg by mouth every 8 (eight) hours as needed for muscle spasms.   Yes [provider]  amoxicillin-clavulanate (AUGMENTIN) 875-125 MG tablet Take 1 tablet by mouth 2 (two) times daily. Patient not taking: No sig reported 06/29/20   [provider]  BINAXNOW COVID-19 AG HOME TEST KIT See admin instructions. 08/31/20   [provider]  fluticasone (FLOVENT HFA) 110 MCG/ACT inhaler Inhale 2 puffs into the lungs 2 (two) times daily. For shortness of breath Patient not taking: Reported on 09/26/2020 12/24/16   Lindell Spar I, NP  gabapentin (NEURONTIN) 100 MG capsule Take 100 mg by mouth 4 (four) times daily. Patient not taking: Reported on 09/26/2020    [provider]  metFORMIN (GLUCOPHAGE) 500 MG tablet Take 1 tablet (500 mg total) by mouth 2 (two) times daily with a meal. Patient not taking: No sig reported 01/01/20   Noemi Chapel, MD  methocarbamol (ROBAXIN) 500 MG tablet Take 1 tablet (500 mg total) by mouth every 8 (eight) hours as needed for muscle spasms. Patient not taking: No sig reported 02/02/20   Orpah Greek,  MD  trazodone (DESYREL) 300 MG tablet Take 300 mg by mouth at bedtime. Patient not taking: Reported on 09/26/2020    [provider]    Allergies    Bactrim [sulfamethoxazole-trimethoprim], Hydrocodone-acetaminophen, and Vicodin [hydrocodone-acetaminophen]  Review of Systems   Review of Systems  Constitutional: Negative for chills and fever.  HENT: Negative for ear pain and sore throat.   Eyes: Negative for pain and visual disturbance.  Respiratory: Negative for cough and shortness of breath.   Cardiovascular: Negative for chest pain and palpitations.  Gastrointestinal: Positive for abdominal pain, diarrhea, nausea and vomiting.  Genitourinary: Negative for dysuria and hematuria.  Musculoskeletal: Negative for arthralgias and back pain.  Skin: Negative for color change and rash.  Neurological: Negative for seizures and syncope.  All other systems reviewed and are negative.  Physical Exam Updated Vital Signs BP 135/75   Pulse 75   Temp 98 F (36.7 C) (Oral)   Resp 20   LMP  (LMP Unknown)   SpO2 100%   Physical Exam Constitutional:      General: She is not in acute distress. HENT:     Head: Normocephalic and atraumatic.  Eyes:     Conjunctiva/sclera: Conjunctivae normal.     Pupils: Pupils are equal, round, and reactive to light.  Cardiovascular:     Rate and Rhythm: Normal rate and regular rhythm.  Pulmonary:     Effort: Pulmonary effort is normal. No respiratory distress.  Abdominal:     General: There is no distension.     Tenderness: There is no abdominal tenderness. There is no guarding.  Skin:    General: Skin is warm and dry.  Neurological:     General: No focal deficit present.     Mental Status: She is alert. Mental status is at baseline.  Psychiatric:        Mood and Affect: Mood normal.        Behavior: Behavior normal.     ED Results / Procedures / Treatments   Labs (all labs ordered are listed, but only abnormal results are  displayed) Labs Reviewed  COMPREHENSIVE METABOLIC PANEL - Abnormal; Notable for the following components:      Result Value   Potassium 3.3 (*)    Glucose, Bld 188 (*)    AST 66 (*)    ALT 58 (*)    All other components within normal limits  URINALYSIS, ROUTINE W REFLEX MICROSCOPIC - Abnormal; Notable for the following components:   Glucose, UA >=500 (*)    Leukocytes,Ua SMALL (*)    Bacteria, UA RARE (*)    All other components within normal limits  BLOOD GAS, VENOUS - Abnormal; Notable for the following components:   pH, Ven 7.512 (*)    pCO2, Ven 33.5 (*)    pO2, Ven 89.8 (*)    Bicarbonate 28.2 (*)    Acid-Base Excess 3.6 (*)    All other components within normal limits  CBG MONITORING, ED - Abnormal; Notable for the following components:   Glucose-Capillary 236 (*)    All other components within normal limits  LIPASE, BLOOD  CBC WITH DIFFERENTIAL/PLATELET    EKG None  Radiology No results found.  Procedures Procedures   Medications Ordered in ED Medications  ondansetron (ZOFRAN-ODT) disintegrating tablet 4 mg (4 mg Oral Given 09/26/20 2042)    ED Course  I have reviewed the triage vital signs and the nursing notes.  Pertinent labs & imaging results that were available during my care of the patient were reviewed by me and considered in my medical decision making (see chart for details).  Differential diagnosis includes viral gastroenteritis versus foodborne illness versus other.  Benign abdominal exam.  Low suspicion for acute appendicitis or significant inflammatory process.  Labs ordered and reviewed.  Lipase normal.  LFTs very mildly elevated, seen on prior test.  White blood cell count normal.  UA showing a small amount of leukocytes, negative nitrites.  I doubt this is UTI.  Potassium is mildly low at 3.3.  Discussed this with her, she prefers to add potassium to her diet does not want pills.  She was p.o. challenge successfully.  I will discharge her with  Zofran.  Blood sugar on CMP came down to 188.  She can resume her p.o. meds at home. Clinical Course  as of 09/27/20 0924  Tue Sep 26, 2020  2038 Patient vomited prior to discharge.  We will give a dose of Zofran. [MT]    Clinical Course User Index [MT] Wyvonnia Dusky, MD   Able to challenge PO afterwards.  Discharged.  Final Clinical Impression(s) / ED Diagnoses Final diagnoses:  Food poisoning    Rx / DC Orders ED Discharge Orders         Ordered    ondansetron (ZOFRAN) 4 MG tablet  Every 8 hours PRN        09/26/20 2014           Wyvonnia Dusky, MD 09/27/20 [phone]

## 2020-10-27 ENCOUNTER — Other Ambulatory Visit: Payer: Self-pay

## 2020-10-27 ENCOUNTER — Ambulatory Visit (INDEPENDENT_AMBULATORY_CARE_PROVIDER_SITE_OTHER): Payer: No Typology Code available for payment source

## 2020-10-27 ENCOUNTER — Ambulatory Visit
Admission: EM | Admit: 2020-10-27 | Discharge: 2020-10-27 | Disposition: A | Discharge status: Home / Self Care | Payer: No Typology Code available for payment source | Attending: Emergency Medicine | Admitting: Emergency Medicine

## 2020-10-27 ENCOUNTER — Encounter: Payer: Self-pay | Admitting: Emergency Medicine

## 2020-10-27 DIAGNOSIS — M25572 Pain in left ankle and joints of left foot: Secondary | ICD-10-CM | POA: Diagnosis not present

## 2020-10-27 DIAGNOSIS — W19XXXA Unspecified fall, initial encounter: Secondary | ICD-10-CM

## 2020-10-27 DIAGNOSIS — S82892A Other fracture of left lower leg, initial encounter for closed fracture: Secondary | ICD-10-CM | POA: Diagnosis not present

## 2020-10-27 DIAGNOSIS — M25472 Effusion, left ankle: Secondary | ICD-10-CM | POA: Diagnosis not present

## 2020-10-27 NOTE — Discharge Instructions (Signed)
X-ray with ankle fracture Continue conservative management of rest, ice, and elevation Cam walker and crutches given; remain nonweight-bearing until cleared by ortho Alternate ibuprofen and tylenol for pain Use your prescribed norco for severe break through Follow up with ortho for further evaluation and management Return or go to the ER if you have any new or worsening symptoms (fever, chills, chest pain, redness, swelling, bruising, deformity, etc...)

## 2020-10-27 NOTE — ED Triage Notes (Signed)
Pt fell last night, having pain and swelling to LT ankle

## 2020-10-27 NOTE — ED Provider Notes (Addendum)
[place]   [id number] 10/27/20 Arrival Time: 1942  DP:OEUMP PAIN  SUBJECTIVE: History from: patient. LORRAINA [name] is a 40 y.o. female complains of LEFT ankle pain and injury that occurred last night.  Collapse with ankle underneath her.  Localizes the pain to the front of ankle.  Describes the pain as intermittent and throbbing in character.  Has tried OTC medications and icing with temporary relief.  Symptoms are made worse with walking.  Denies similar symptoms in the past.  Complains of associated swelling and ecchymosis. Denies fever, chills, erythema, weakness, numbness and tingling  ROS: As per HPI.  All other pertinent ROS negative.     Past Medical History:  Diagnosis Date  . Anxiety   . Bipolar 1 disorder ([place])   . Complication of anesthesia    hard time waking up   . Depression   . Diabetes mellitus without complication ([place])   . GERD (gastroesophageal reflux disease)    no meds  . Kidney stones   . Low back pain   . Migraines   . PCOS (polycystic ovarian syndrome)   . PONV (postoperative nausea and vomiting)   . Schizophrenia ([place])   . Shortness of breath dyspnea    with bronchitis   Past Surgical History:  Procedure Laterality Date  . ABDOMINAL HYSTERECTOMY N/A 11/14/2015   Procedure: HYSTERECTOMY ABDOMINAL;  Surgeon: Olga Millers, MD;  Location: [facility];  Service: Gynecology;  Laterality: N/A;  . CHOLECYSTECTOMY    . INTRAUTERINE DEVICE (IUD) INSERTION  06/14/2014   [facility]  . LYMPH NODES REMOVED    . ovarian cyst removed    . SALPINGOOPHORECTOMY Bilateral 11/14/2015   Procedure: SALPINGO OOPHORECTOMY;  Surgeon: Olga Millers, MD;  Location: [place] [facility];  Service: Gynecology;  Laterality: Bilateral;   Allergies  Allergen Reactions  . Bactrim [Sulfamethoxazole-Trimethoprim] Hives and Itching  . Hydrocodone-Acetaminophen Itching  . Vicodin [Hydrocodone-Acetaminophen] Hives and Itching   No current facility-administered  medications on file prior to encounter.   Current Outpatient Medications on File Prior to Encounter  Medication Sig Dispense Refill  . BINAXNOW COVID-19 AG HOME TEST KIT See admin instructions.    Marland Kitchen doxepin (SINEQUAN) 25 MG capsule Take 50 mg by mouth at bedtime.    Marland Kitchen FLUoxetine (PROZAC) 20 MG capsule Take 1 capsule (20 mg total) by mouth daily. (Patient taking differently: Take 60 mg by mouth daily.) 90 capsule 0  . gabapentin (NEURONTIN) 300 MG capsule Take 1 capsule by mouth 2 (two) times daily.    Marland Kitchen HYDROcodone-acetaminophen (NORCO) 7.5-325 MG tablet Take 1 tablet by mouth every 6 (six) hours as needed. for pain    . ibuprofen (ADVIL) 800 MG tablet Take 1 tablet (800 mg total) by mouth every 6 (six) hours as needed for moderate pain. 20 tablet 0  . JARDIANCE 10 MG TABS tablet Take 10 mg by mouth daily.    Marland Kitchen lisinopril-hydrochlorothiazide (ZESTORETIC) 20-25 MG tablet Take 1 tablet by mouth daily.    . metoprolol succinate (TOPROL-XL) 25 MG 24 hr tablet Take 1 tablet by mouth daily.    Marland Kitchen OLANZapine (ZYPREXA) 10 MG tablet Take 10 mg by mouth in the morning and at bedtime.    Marland Kitchen oxybutynin (DITROPAN-XL) 5 MG 24 hr tablet Take one tablet twice daily 60 tablet 2  . pantoprazole (PROTONIX) 40 MG tablet Take 1 tablet by mouth daily.    . prazosin (MINIPRESS) 2 MG capsule Take 2 mg by mouth 2 (two) times daily.    Marland Kitchen  RYBELSUS 7 MG TABS Take 1 tablet by mouth daily.    Marland Kitchen tiZANidine (ZANAFLEX) 4 MG tablet Take 4 mg by mouth every 8 (eight) hours as needed for muscle spasms.    . [DISCONTINUED] fluticasone (FLOVENT HFA) 110 MCG/ACT inhaler Inhale 2 puffs into the lungs 2 (two) times daily. For shortness of breath (Patient not taking: Reported on 09/26/2020) 3 Inhaler 0  . [DISCONTINUED] metFORMIN (GLUCOPHAGE) 500 MG tablet Take 1 tablet (500 mg total) by mouth 2 (two) times daily with a meal. (Patient not taking: No sig reported) 60 tablet 1  . [DISCONTINUED] trazodone (DESYREL) 300 MG tablet Take 300 mg  by mouth at bedtime. (Patient not taking: Reported on 09/26/2020)     Social History   Socioeconomic History  . Marital status: Divorced    Spouse name: Not on file  . Number of children: Not on file  . Years of education: Not on file  . Highest education level: Not on file  Occupational History  . Not on file  Tobacco Use  . Smoking status: Never Smoker  . Smokeless tobacco: Never Used  Vaping Use  . Vaping Use: Never used  Substance and Sexual Activity  . Alcohol use: No  . Drug use: No  . Sexual activity: Never  Other Topics Concern  . Not on file  Social History Narrative  . Not on file   Social Determinants of Health   Financial Resource Strain: Not on file  Food Insecurity: Not on file  Transportation Needs: Not on file  Physical Activity: Not on file  Stress: Not on file  Social Connections: Not on file  Intimate Partner Violence: Not on file   No family history on file.  OBJECTIVE:  Vitals:   10/27/20 1957  BP: 105/70  Pulse: 74  Resp: 19  Temp: 98.6 F (37 C)  TempSrc: Oral  SpO2: 95%    General appearance: ALERT; in no acute distress.  Head: NCAT Lungs: Normal respiratory effort CV: Dorsalis pedis pulse 2+ Musculoskeletal: LT ankle Inspection: diffuse swelling about the ankle Palpation: diffusely TTP over ankle ROM: LROM Strength:deferred Skin: warm and dry Neurologic: Sitting in wheelchair; Sensation intact about the upper/ lower extremities Psychological: alert and cooperative; normal mood and affect  DIAGNOSTIC STUDIES:  DG Ankle Complete Left  Result Date: 10/27/2020 CLINICAL DATA:  Pain. Fall last night with left ankle pain and swelling. EXAM: LEFT ANKLE COMPLETE - 3+ VIEW COMPARISON:  None. FINDINGS: Mildly comminuted minimally displaced primarily oblique fracture through the distal medial tibia and medial malleolus. There is a questionable oblique nondisplaced distal fibular fracture. No evidence of posterior tibial tubercle fracture.  No ankle joint effusion. No mortise widening. Tiny plantar calcaneal spur and Achilles tendon enthesophyte. Generalized soft tissue edema. IMPRESSION: Mildly comminuted minimally displaced distal medial tibial/medial malleolar fracture. Questionable nondisplaced distal fibular fracture. Electronically Signed   By: Keith Rake M.D.   On: 10/27/2020 20:24    I have reviewed the x-rays myself and the radiologist interpretation. I am in agreement with the radiologist interpretation.     ASSESSMENT & PLAN:  1. Closed fracture of left ankle, initial encounter    X-ray with ankle fracture Continue conservative management of rest, ice, and elevation Cam walker and crutches given; remain nonweight-bearing until cleared by ortho Alternate ibuprofen and tylenol for pain Use your prescribed norco for severe break through Follow up with ortho for further evaluation and management Return or go to the ER if you have any new or  worsening symptoms (fever, chills, chest pain, redness, swelling, bruising, deformity, etc...)   [place] Controlled Substances Registry consulted for this patient. I feel the risk/benefit ratio today is favorable for proceeding with this prescription for a controlled substance. Medication sedation precautions given.  Reviewed expectations re: course of current medical issues. Questions answered. Outlined signs and symptoms indicating need for more acute intervention. Patient verbalized understanding. After Visit Summary given.    Lestine Box, PA-C 10/27/20 2026    Lestine Box, PA-C 10/27/20 2027

## 2021-03-20 ENCOUNTER — Ambulatory Visit: Payer: Self-pay

## 2021-03-29 ENCOUNTER — Other Ambulatory Visit: Payer: Self-pay

## 2021-03-29 ENCOUNTER — Ambulatory Visit
Admission: EM | Admit: 2021-03-29 | Discharge: 2021-03-29 | Disposition: A | Discharge status: Home / Self Care | Payer: No Typology Code available for payment source | Attending: Family Medicine | Admitting: Family Medicine

## 2021-03-29 DIAGNOSIS — N760 Acute vaginitis: Secondary | ICD-10-CM | POA: Insufficient documentation

## 2021-03-29 DIAGNOSIS — B9689 Other specified bacterial agents as the cause of diseases classified elsewhere: Secondary | ICD-10-CM | POA: Diagnosis not present

## 2021-03-29 DIAGNOSIS — R350 Frequency of micturition: Secondary | ICD-10-CM | POA: Diagnosis not present

## 2021-03-29 LAB — POCT URINALYSIS DIP (MANUAL ENTRY)
Bilirubin, UA: NEGATIVE
Blood, UA: NEGATIVE
Glucose, UA: >=1000 mg/dL — AB
Ketones, POC UA: NEGATIVE mg/dL
Leukocytes, UA: NEGATIVE
Nitrite, UA: NEGATIVE
Protein Ur, POC: NEGATIVE mg/dL
Spec Grav, UA: 1.01 (ref 1.010–1.025)
Urobilinogen, UA: 0.2 E.U./dL
pH, UA: 5 (ref 5.0–8.0)

## 2021-03-29 MED ORDER — CEPHALEXIN 500 MG PO CAPS: 500.0000 mg | ORAL_CAPSULE | Freq: Two times a day (BID) | ORAL | 0 refills | Status: DC

## 2021-03-29 NOTE — ED Triage Notes (Signed)
Pt presents with dysuria x 3 days.

## 2021-03-29 NOTE — ED Provider Notes (Signed)
[facility]    CSN: [id number] Arrival date & time: 03/29/21  1347      History   Chief Complaint Chief Complaint  Patient presents with   Dysuria    HPI Andrea [name] is a 40 y.o. female.   Patient presenting today with 3-day history of dysuria, urinary frequency, urinary hesitancy and retention.  She has a history of pyelonephritis, urinary tract infections, uncontrolled diabetes.  She denies fever, chills, nausea, vomiting, hematuria, vaginal discharge or irritation.  No concern for STDs.  She is status post hysterectomy.   Past Medical History:  Diagnosis Date   Anxiety    Bipolar 1 disorder ([place])    Complication of anesthesia    hard time waking up    Depression    Diabetes mellitus without complication ([place])    GERD (gastroesophageal reflux disease)    no meds   Kidney stones    Low back pain    Migraines    PCOS (polycystic ovarian syndrome)    PONV (postoperative nausea and vomiting)    Schizophrenia ([place])    Shortness of breath dyspnea    with bronchitis    Patient Active Problem List   Diagnosis Date Noted   Irregular periods 09/26/2020   Mild persistent asthma with acute exacerbation 06/18/2017   Bipolar I disorder, most recent episode depressed ([place]) 12/20/2016   Bipolar 1 disorder, mixed, severe ([place]) 12/20/2016   Type 2 diabetes mellitus with hyperglycemia ([place]) 09/18/2016   HTN (hypertension) 09/18/2016   Seizures ([place]) 06/04/2016   Pelvic pain in female 11/14/2015   Acute pyelonephritis 01/16/2013   Migraines 10/30/2012   Morbid obesity ([place]) 10/30/2012   Depression with anxiety 10/30/2012   Kidney stones     Past Surgical History:  Procedure Laterality Date   ABDOMINAL HYSTERECTOMY N/A 11/14/2015   Procedure: HYSTERECTOMY ABDOMINAL;  Surgeon: Olga Millers, MD;  Location: [facility];  Service: Gynecology;  Laterality: N/A;   CHOLECYSTECTOMY     INTRAUTERINE DEVICE (IUD) INSERTION  06/14/2014   [facility]    LYMPH NODES REMOVED     ovarian cyst removed     SALPINGOOPHORECTOMY Bilateral 11/14/2015   Procedure: SALPINGO OOPHORECTOMY;  Surgeon: Olga Millers, MD;  Location: [facility];  Service: Gynecology;  Laterality: Bilateral;    OB History   No obstetric history on file.      Home Medications    Prior to Admission medications   Medication Sig Start Date End Date Taking? Authorizing Provider  cephALEXin (KEFLEX) 500 MG capsule Take 1 capsule (500 mg total) by mouth 2 (two) times daily. 03/29/21  Yes Volney American, PA-C  BINAXNOW COVID-19 AG HOME TEST KIT See admin instructions. 08/31/20   [provider]  doxepin (SINEQUAN) 25 MG capsule Take 50 mg by mouth at bedtime. 09/05/20   [provider]  FLUoxetine (PROZAC) 20 MG capsule Take 1 capsule (20 mg total) by mouth daily. Patient taking differently: Take 60 mg by mouth daily. 08/14/17   Kathyrn Drown, MD  gabapentin (NEURONTIN) 300 MG capsule Take 1 capsule by mouth 2 (two) times daily. 09/05/20   [provider]  HYDROcodone-acetaminophen (NORCO) 7.5-325 MG tablet Take 1 tablet by mouth every 6 (six) hours as needed. for pain 07/31/20   [provider]  ibuprofen (ADVIL) 800 MG tablet Take 1 tablet (800 mg total) by mouth every 6 (six) hours as needed for moderate pain. 02/02/20   Orpah Greek, MD  JARDIANCE 10  MG TABS tablet Take 10 mg by mouth daily. 09/11/20   [provider]  lisinopril-hydrochlorothiazide (ZESTORETIC) 20-25 MG tablet Take 1 tablet by mouth daily.    [provider]  metoprolol succinate (TOPROL-XL) 25 MG 24 hr tablet Take 1 tablet by mouth daily. 08/17/20   [provider]  OLANZapine (ZYPREXA) 10 MG tablet Take 10 mg by mouth in the morning and at bedtime.    [provider]  oxybutynin (DITROPAN-XL) 5 MG 24 hr tablet Take one tablet twice daily 09/04/17   Luking, Elayne Snare, MD  pantoprazole (PROTONIX) 40 MG tablet Take 1 tablet by mouth  daily. 08/31/20   [provider]  prazosin (MINIPRESS) 2 MG capsule Take 2 mg by mouth 2 (two) times daily.    [provider]  RYBELSUS 7 MG TABS Take 1 tablet by mouth daily. 09/17/20   [provider]  tiZANidine (ZANAFLEX) 4 MG tablet Take 4 mg by mouth every 8 (eight) hours as needed for muscle spasms.    [provider]  fluticasone (FLOVENT HFA) 110 MCG/ACT inhaler Inhale 2 puffs into the lungs 2 (two) times daily. For shortness of breath Patient not taking: Reported on 09/26/2020 12/24/16 10/27/20  Lindell Spar I, NP  metFORMIN (GLUCOPHAGE) 500 MG tablet Take 1 tablet (500 mg total) by mouth 2 (two) times daily with a meal. Patient not taking: No sig reported 01/01/20 10/27/20  Noemi Chapel, MD  trazodone (DESYREL) 300 MG tablet Take 300 mg by mouth at bedtime. Patient not taking: Reported on 09/26/2020  10/27/20  [provider]    Family History Family History  Problem Relation Age of Onset   Healthy Mother    Healthy Father     Social History Social History   Tobacco Use   Smoking status: Never   Smokeless tobacco: Never  Vaping Use   Vaping Use: Never used  Substance Use Topics   Alcohol use: No   Drug use: No     Allergies   Bactrim [sulfamethoxazole-trimethoprim], Hydrocodone-acetaminophen, and Vicodin [hydrocodone-acetaminophen]   Review of Systems Review of Systems Per HPI  Physical Exam Triage Vital Signs ED Triage Vitals [03/29/21 1411]  Enc Vitals Group     BP 107/76     Pulse Rate 66     Resp 19     Temp 97.9 F (36.6 C)     Temp src      SpO2 93 %     Weight      Height      Head Circumference      Peak Flow      Pain Score 7     Pain Loc      Pain Edu?      Excl. in [place]?    No data found.  Updated Vital Signs BP 107/76   Pulse 66   Temp 97.9 F (36.6 C)   Resp 19   LMP  (LMP Unknown)   SpO2 93%   Visual Acuity Right Eye Distance:   Left Eye Distance:   Bilateral Distance:    Right  Eye Near:   Left Eye Near:    Bilateral Near:     Physical Exam Vitals and nursing note reviewed.  Constitutional:      Appearance: Normal appearance. She is not ill-appearing.  HENT:     Head: Atraumatic.     Mouth/Throat:     Mouth: Mucous membranes are moist.     Pharynx: Oropharynx is clear.  Eyes:  Extraocular Movements: Extraocular movements intact.     Conjunctiva/sclera: Conjunctivae normal.  Cardiovascular:     Rate and Rhythm: Normal rate and regular rhythm.     Heart sounds: Normal heart sounds.  Pulmonary:     Effort: Pulmonary effort is normal.     Breath sounds: Normal breath sounds.  Abdominal:     General: Bowel sounds are normal. There is no distension.     Palpations: Abdomen is soft.     Tenderness: There is no abdominal tenderness. There is no right CVA tenderness, left CVA tenderness or guarding.  Musculoskeletal:        General: Normal range of motion.     Cervical back: Normal range of motion and neck supple.  Skin:    General: Skin is warm and dry.  Neurological:     Mental Status: She is alert and oriented to person, place, and time.  Psychiatric:        Mood and Affect: Mood normal.        Thought Content: Thought content normal.        Judgment: Judgment normal.     UC Treatments / Results  Labs (all labs ordered are listed, but only abnormal results are displayed) Labs Reviewed  POCT URINALYSIS DIP (MANUAL ENTRY) - Abnormal; Notable for the following components:      Result Value   Glucose, UA >=1,000 (*)    All other components within normal limits  CERVICOVAGINAL ANCILLARY ONLY    EKG   Radiology No results found.  Procedures Procedures (including critical care time)  Medications Ordered in UC Medications - No data to display  Initial Impression / Assessment and Plan / UC Course  I have reviewed the triage vital signs and the nursing notes.  Pertinent labs & imaging results that were available during my care of the  patient were reviewed by me and considered in my medical decision making (see chart for details).     Vitals and exam overall reassuring today, given her worsening symptoms and history of complicated urinary tract infections, uncontrolled diabetes will cover for urinary tract infection despite normal urinalysis today.  We will also check a vaginal swab to ensure a vaginal infection is not causing her urinary symptoms.  PCP follow-up for recheck recommended first thing next week.  Return for acutely worsening symptoms at any time.  Final Clinical Impressions(s) / UC Diagnoses   Final diagnoses:  Urinary frequency   Discharge Instructions   None    ED Prescriptions     Medication Sig Dispense Auth. Provider   cephALEXin (KEFLEX) 500 MG capsule Take 1 capsule (500 mg total) by mouth 2 (two) times daily. 10 capsule Volney American, [place]      PDMP not reviewed this encounter.   Volney American, [place] 03/29/21 1442

## 2021-03-30 LAB — CERVICOVAGINAL ANCILLARY ONLY
Bacterial Vaginitis (gardnerella): POSITIVE — AB
Candida Glabrata: POSITIVE — AB
Candida Vaginitis: NEGATIVE
Chlamydia: NEGATIVE
Comment: NEGATIVE
Comment: NEGATIVE
Comment: NEGATIVE
Comment: NEGATIVE
Comment: NEGATIVE
Comment: NORMAL
Neisseria Gonorrhea: NEGATIVE
Trichomonas: NEGATIVE

## 2021-04-02 ENCOUNTER — Telehealth: Payer: Self-pay

## 2021-04-02 MED ORDER — METRONIDAZOLE 500 MG PO TABS: 500.0000 mg | ORAL_TABLET | Freq: Two times a day (BID) | ORAL | 0 refills | Status: DC

## 2021-04-02 MED ORDER — FLUCONAZOLE 200 MG PO TABS: 200.0000 mg | ORAL_TABLET | Freq: Once | ORAL | 0 refills | Status: AC

## 2021-04-15 ENCOUNTER — Ambulatory Visit
Admission: EM | Admit: 2021-04-15 | Discharge: 2021-04-15 | Disposition: A | Discharge status: Home / Self Care | Payer: No Typology Code available for payment source | Attending: Urgent Care | Admitting: Urgent Care

## 2021-04-15 ENCOUNTER — Other Ambulatory Visit: Payer: Self-pay

## 2021-04-15 ENCOUNTER — Encounter: Payer: Self-pay | Admitting: Emergency Medicine

## 2021-04-15 DIAGNOSIS — Z20822 Contact with and (suspected) exposure to covid-19: Secondary | ICD-10-CM

## 2021-04-15 DIAGNOSIS — Z794 Long term (current) use of insulin: Secondary | ICD-10-CM

## 2021-04-15 DIAGNOSIS — J4530 Mild persistent asthma, uncomplicated: Secondary | ICD-10-CM

## 2021-04-15 DIAGNOSIS — J069 Acute upper respiratory infection, unspecified: Secondary | ICD-10-CM

## 2021-04-15 DIAGNOSIS — E119 Type 2 diabetes mellitus without complications: Secondary | ICD-10-CM

## 2021-04-15 DIAGNOSIS — R0981 Nasal congestion: Secondary | ICD-10-CM

## 2021-04-15 MED ORDER — CETIRIZINE HCL 10 MG PO TABS: 10.0000 mg | ORAL_TABLET | Freq: Every day | ORAL | 0 refills | Status: DC

## 2021-04-15 MED ORDER — PROMETHAZINE-DM 6.25-15 MG/5ML PO SYRP: 5.0000 mL | ORAL_SOLUTION | Freq: Every evening | ORAL | 0 refills | Status: DC | PRN

## 2021-04-15 MED ORDER — BENZONATATE 100 MG PO CAPS: 100.0000 mg | ORAL_CAPSULE | Freq: Three times a day (TID) | ORAL | 0 refills | Status: DC | PRN

## 2021-04-15 MED ORDER — ALBUTEROL SULFATE HFA 108 (90 BASE) MCG/ACT IN AERS: 1.0000 | INHALATION_SPRAY | Freq: Four times a day (QID) | RESPIRATORY_TRACT | 0 refills | Status: DC | PRN

## 2021-04-15 MED ORDER — PSEUDOEPHEDRINE HCL 60 MG PO TABS: 60.0000 mg | ORAL_TABLET | Freq: Three times a day (TID) | ORAL | 0 refills | Status: DC | PRN

## 2021-04-15 NOTE — ED Triage Notes (Signed)
Body aches, sore throat, cough and congestion since yesterday.

## 2021-04-15 NOTE — Discharge Instructions (Signed)

## 2021-04-15 NOTE — ED Provider Notes (Signed)
[facility]   MRN: [id number] DOB: [birth year]  Subjective:   Andrea [name] is a 40 y.o. female presenting for 1 day history of acute onset sinus congestion, postnasal drainage, throat pain, coughing, body aches.  Patient reports that she is also had some tightness over the lower part of her chest especially when she coughs.  She has a history of mild asthma but has not had an inhaler for about 6 months.  No shortness of breath or wheezing.  Patient is a type II diabetic treated with insulin.  No current facility-administered medications for this encounter.  Current Outpatient Medications:    BINAXNOW COVID-19 AG HOME TEST KIT, See admin instructions., Disp: , Rfl:    cephALEXin (KEFLEX) 500 MG capsule, Take 1 capsule (500 mg total) by mouth 2 (two) times daily., Disp: 10 capsule, Rfl: 0   doxepin (SINEQUAN) 25 MG capsule, Take 50 mg by mouth at bedtime., Disp: , Rfl:    FLUoxetine (PROZAC) 20 MG capsule, Take 1 capsule (20 mg total) by mouth daily. (Patient taking differently: Take 60 mg by mouth daily.), Disp: 90 capsule, Rfl: 0   gabapentin (NEURONTIN) 300 MG capsule, Take 1 capsule by mouth 2 (two) times daily., Disp: , Rfl:    HYDROcodone-acetaminophen (NORCO) 7.5-325 MG tablet, Take 1 tablet by mouth every 6 (six) hours as needed. for pain, Disp: , Rfl:    ibuprofen (ADVIL) 800 MG tablet, Take 1 tablet (800 mg total) by mouth every 6 (six) hours as needed for moderate pain., Disp: 20 tablet, Rfl: 0   JARDIANCE 10 MG TABS tablet, Take 10 mg by mouth daily., Disp: , Rfl:    lisinopril-hydrochlorothiazide (ZESTORETIC) 20-25 MG tablet, Take 1 tablet by mouth daily., Disp: , Rfl:    metoprolol succinate (TOPROL-XL) 25 MG 24 hr tablet, Take 1 tablet by mouth daily., Disp: , Rfl:    metroNIDAZOLE (FLAGYL) 500 MG tablet, Take 1 tablet (500 mg total) by mouth 2 (two) times daily., Disp: 14 tablet, Rfl: 0   OLANZapine (ZYPREXA) 10 MG tablet, Take 10 mg by mouth in the morning  and at bedtime., Disp: , Rfl:    oxybutynin (DITROPAN-XL) 5 MG 24 hr tablet, Take one tablet twice daily, Disp: 60 tablet, Rfl: 2   pantoprazole (PROTONIX) 40 MG tablet, Take 1 tablet by mouth daily., Disp: , Rfl:    prazosin (MINIPRESS) 2 MG capsule, Take 2 mg by mouth 2 (two) times daily., Disp: , Rfl:    RYBELSUS 7 MG TABS, Take 1 tablet by mouth daily., Disp: , Rfl:    tiZANidine (ZANAFLEX) 4 MG tablet, Take 4 mg by mouth every 8 (eight) hours as needed for muscle spasms., Disp: , Rfl:    Allergies  Allergen Reactions   Bactrim [Sulfamethoxazole-Trimethoprim] Hives and Itching   Hydrocodone-Acetaminophen Itching   Vicodin [Hydrocodone-Acetaminophen] Hives and Itching    Past Medical History:  Diagnosis Date   Anxiety    Bipolar 1 disorder ([place])    Complication of anesthesia    hard time waking up    Depression    Diabetes mellitus without complication ([facility])    GERD (gastroesophageal reflux disease)    no meds   Kidney stones    Low back pain    Migraines    PCOS (polycystic ovarian syndrome)    PONV (postoperative nausea and vomiting)    Schizophrenia ([facility])    Shortness of breath dyspnea    with bronchitis     Past Surgical History:  Procedure  Laterality Date   ABDOMINAL HYSTERECTOMY N/A 11/14/2015   Procedure: HYSTERECTOMY ABDOMINAL;  Surgeon: Olga Millers, MD;  Location: [facility];  Service: Gynecology;  Laterality: N/A;   CHOLECYSTECTOMY     INTRAUTERINE DEVICE (IUD) INSERTION  06/14/2014   [facility]   LYMPH NODES REMOVED     ovarian cyst removed     SALPINGOOPHORECTOMY Bilateral 11/14/2015   Procedure: SALPINGO OOPHORECTOMY;  Surgeon: Olga Millers, MD;  Location: [facility];  Service: Gynecology;  Laterality: Bilateral;    Family History  Problem Relation Age of Onset   Healthy Mother    Healthy Father     Social History   Tobacco Use   Smoking status: Never   Smokeless tobacco: Never  Vaping Use   Vaping Use: Never used  Substance Use Topics    Alcohol use: No   Drug use: No    ROS   Objective:   Vitals: BP 122/83 (BP Location: Right Arm)   Pulse 98   Temp 98.3 F (36.8 C) (Oral)   Resp 18   LMP  (LMP Unknown)   SpO2 95%   Physical Exam Constitutional:      General: She is not in acute distress.    Appearance: Normal appearance. She is well-developed. She is obese. She is not ill-appearing, toxic-appearing or diaphoretic.  HENT:     Head: Normocephalic and atraumatic.     Right Ear: Tympanic membrane and ear canal normal. No drainage or tenderness. No middle ear effusion. Tympanic membrane is not erythematous.     Left Ear: Tympanic membrane and ear canal normal. No drainage or tenderness.  No middle ear effusion. Tympanic membrane is not erythematous.     Nose: Congestion present. No rhinorrhea.     Mouth/Throat:     Mouth: Mucous membranes are moist. No oral lesions.     Pharynx: No pharyngeal swelling, oropharyngeal exudate, posterior oropharyngeal erythema or uvula swelling.     Tonsils: No tonsillar exudate or tonsillar abscesses.     Comments: Significant postnasal drainage overlying pharynx. Eyes:     Extraocular Movements: Extraocular movements intact.     Right eye: Normal extraocular motion.     Left eye: Normal extraocular motion.     Conjunctiva/sclera: Conjunctivae normal.     Pupils: Pupils are equal, round, and reactive to light.  Cardiovascular:     Rate and Rhythm: Normal rate and regular rhythm.     Pulses: Normal pulses.     Heart sounds: Normal heart sounds. No murmur heard.   No friction rub. No gallop.  Pulmonary:     Effort: Pulmonary effort is normal. No respiratory distress.     Breath sounds: Normal breath sounds. No stridor. No wheezing, rhonchi or rales.  Musculoskeletal:     Cervical back: Normal range of motion and neck supple.  Lymphadenopathy:     Cervical: No cervical adenopathy.  Skin:    General: Skin is warm and dry.     Findings: No rash.  Neurological:      General: No focal deficit present.     Mental Status: She is alert and oriented to person, place, and time.  Psychiatric:        Mood and Affect: Mood normal.        Behavior: Behavior normal.        Thought Content: Thought content normal.    Assessment and Plan :   PDMP not reviewed this encounter.  1. Viral URI with cough  2. Exposure to COVID-19 virus   3. Nasal congestion   4. Type 2 diabetes mellitus treated with insulin ([place])   5. Mild persistent asthma without complication     Advised against steroid use because of her diabetes.  Recommended albuterol for bronchospasms, chest tightness in the setting of her asthma. Deferred imaging given clear cardiopulmonary exam, hemodynamically stable vital signs. Respiratory panel pending. Will manage for viral illness such as viral URI, viral syndrome, viral rhinitis, COVID-19, influenza. Recommended supportive care. Offered scripts for symptomatic relief. Testing is pending. Counseled patient on potential for adverse effects with medications prescribed/recommended today, ER and return-to-clinic precautions discussed, patient verbalized understanding.     Jaynee Eagles, PA-C 04/15/21 1356

## 2021-04-17 LAB — COVID-19, FLU A+B NAA
Influenza A, NAA: NOT DETECTED
Influenza B, NAA: NOT DETECTED
SARS-CoV-2, NAA: DETECTED — AB

## 2021-07-31 ENCOUNTER — Other Ambulatory Visit: Payer: Self-pay

## 2021-07-31 ENCOUNTER — Observation Stay (HOSPITAL_COMMUNITY): Payer: No Typology Code available for payment source

## 2021-07-31 ENCOUNTER — Inpatient Hospital Stay (HOSPITAL_COMMUNITY)
Admission: EM | Admit: 2021-07-31 | Discharge: 2021-08-01 | DRG: 101 | Disposition: A | Discharge status: Home / Self Care | Payer: No Typology Code available for payment source | Attending: Internal Medicine | Admitting: Internal Medicine

## 2021-07-31 ENCOUNTER — Emergency Department (HOSPITAL_COMMUNITY): Payer: No Typology Code available for payment source

## 2021-07-31 ENCOUNTER — Encounter (HOSPITAL_COMMUNITY): Payer: Self-pay

## 2021-07-31 DIAGNOSIS — Z20822 Contact with and (suspected) exposure to covid-19: Secondary | ICD-10-CM | POA: Diagnosis present

## 2021-07-31 DIAGNOSIS — Z79899 Other long term (current) drug therapy: Secondary | ICD-10-CM

## 2021-07-31 DIAGNOSIS — E1165 Type 2 diabetes mellitus with hyperglycemia: Secondary | ICD-10-CM | POA: Diagnosis present

## 2021-07-31 DIAGNOSIS — Z7984 Long term (current) use of oral hypoglycemic drugs: Secondary | ICD-10-CM

## 2021-07-31 DIAGNOSIS — J45909 Unspecified asthma, uncomplicated: Secondary | ICD-10-CM | POA: Diagnosis present

## 2021-07-31 DIAGNOSIS — G4089 Other seizures: Principal | ICD-10-CM | POA: Diagnosis present

## 2021-07-31 DIAGNOSIS — R55 Syncope and collapse: Secondary | ICD-10-CM | POA: Diagnosis not present

## 2021-07-31 DIAGNOSIS — I10 Essential (primary) hypertension: Secondary | ICD-10-CM | POA: Diagnosis present

## 2021-07-31 DIAGNOSIS — N289 Disorder of kidney and ureter, unspecified: Secondary | ICD-10-CM | POA: Diagnosis present

## 2021-07-31 DIAGNOSIS — R569 Unspecified convulsions: Secondary | ICD-10-CM | POA: Diagnosis not present

## 2021-07-31 DIAGNOSIS — F3163 Bipolar disorder, current episode mixed, severe, without psychotic features: Secondary | ICD-10-CM | POA: Diagnosis present

## 2021-07-31 DIAGNOSIS — F418 Other specified anxiety disorders: Secondary | ICD-10-CM | POA: Diagnosis present

## 2021-07-31 DIAGNOSIS — E66813 Obesity, class 3: Secondary | ICD-10-CM | POA: Diagnosis present

## 2021-07-31 DIAGNOSIS — F411 Generalized anxiety disorder: Secondary | ICD-10-CM | POA: Diagnosis present

## 2021-07-31 DIAGNOSIS — K219 Gastro-esophageal reflux disease without esophagitis: Secondary | ICD-10-CM | POA: Diagnosis present

## 2021-07-31 LAB — BASIC METABOLIC PANEL
Anion gap: 14 (ref 5–15)
BUN: 22 mg/dL — ABNORMAL HIGH (ref 6–20)
CO2: 23 mmol/L (ref 22–32)
Calcium: 9.5 mg/dL (ref 8.9–10.3)
Chloride: 99 mmol/L (ref 98–111)
Creatinine, Ser: 1.21 mg/dL — ABNORMAL HIGH (ref 0.44–1.00)
GFR, Estimated: 58 mL/min — ABNORMAL LOW (ref >60)
Glucose, Bld: 265 mg/dL — ABNORMAL HIGH (ref 70–99)
Potassium: 3.8 mmol/L (ref 3.5–5.1)
Sodium: 136 mmol/L (ref 135–145)

## 2021-07-31 LAB — URINALYSIS, ROUTINE W REFLEX MICROSCOPIC
Bacteria, UA: NONE SEEN
Bilirubin Urine: NEGATIVE
Glucose, UA: >=500 mg/dL — AB
Hgb urine dipstick: NEGATIVE
Ketones, ur: NEGATIVE mg/dL
Leukocytes,Ua: NEGATIVE
Nitrite: NEGATIVE
Protein, ur: NEGATIVE mg/dL
Specific Gravity, Urine: 1.028 (ref 1.005–1.030)
pH: 5 (ref 5.0–8.0)

## 2021-07-31 LAB — CBC
HCT: 41.9 % (ref 36.0–46.0)
Hemoglobin: 13 g/dL (ref 12.0–15.0)
MCH: 30.1 pg (ref 26.0–34.0)
MCHC: 31 g/dL (ref 30.0–36.0)
MCV: 97 fL (ref 80.0–100.0)
Platelets: 185 10*3/uL (ref 150–400)
RBC: 4.32 MIL/uL (ref 3.87–5.11)
RDW: 12.5 % (ref 11.5–15.5)
WBC: 6.6 10*3/uL (ref 4.0–10.5)
nRBC: 0 % (ref 0.0–0.2)

## 2021-07-31 LAB — CBG MONITORING, ED: Glucose-Capillary: 232 mg/dL — ABNORMAL HIGH (ref 70–99)

## 2021-07-31 LAB — HEMOGLOBIN A1C
Hgb A1c MFr Bld: 7.5 % — ABNORMAL HIGH (ref 4.8–5.6)
Mean Plasma Glucose: 168.55 mg/dL

## 2021-07-31 LAB — RESP PANEL BY RT-PCR (FLU A&B, COVID) ARPGX2
Influenza A by PCR: NEGATIVE
Influenza B by PCR: NEGATIVE
SARS Coronavirus 2 by RT PCR: NEGATIVE

## 2021-07-31 LAB — TSH: TSH: 2.132 u[IU]/mL (ref 0.350–4.500)

## 2021-07-31 LAB — CK: Total CK: 79 U/L (ref 38–234)

## 2021-07-31 LAB — GLUCOSE, CAPILLARY: Glucose-Capillary: 256 mg/dL — ABNORMAL HIGH (ref 70–99)

## 2021-07-31 MED ORDER — IPRATROPIUM-ALBUTEROL 0.5-2.5 (3) MG/3ML IN SOLN: 3.0000 mL | RESPIRATORY_TRACT | Status: DC | PRN

## 2021-07-31 MED ORDER — ACETAMINOPHEN 325 MG PO TABS: 650.0000 mg | ORAL_TABLET | Freq: Four times a day (QID) | ORAL | Status: DC | PRN

## 2021-07-31 MED ORDER — DIPHENHYDRAMINE HCL 50 MG/ML IJ SOLN
25.0000 mg | Freq: Once | INTRAMUSCULAR | Status: AC
Start: 2021-07-31 — End: 2021-07-31
  Administered 2021-07-31: 25 mg via INTRAVENOUS
  Filled 2021-07-31: qty 1

## 2021-07-31 MED ORDER — SODIUM CHLORIDE 0.9 % IV BOLUS
1000.0000 mL | Freq: Once | INTRAVENOUS | Status: AC
  Administered 2021-07-31: 1000 mL via INTRAVENOUS

## 2021-07-31 MED ORDER — ENOXAPARIN SODIUM 60 MG/0.6ML IJ SOSY
60.0000 mg | PREFILLED_SYRINGE | INTRAMUSCULAR | Status: DC
  Administered 2021-07-31: 60 mg via SUBCUTANEOUS
  Filled 2021-07-31: qty 0.6

## 2021-07-31 MED ORDER — POLYVINYL ALCOHOL 1.4 % OP SOLN: 1.0000 [drp] | OPHTHALMIC | Status: DC | PRN

## 2021-07-31 MED ORDER — OXYCODONE HCL 5 MG PO TABS
5.0000 mg | ORAL_TABLET | ORAL | Status: DC | PRN
  Administered 2021-08-01 (×3): 5 mg via ORAL
  Filled 2021-07-31 (×3): qty 1

## 2021-07-31 MED ORDER — CHLORHEXIDINE GLUCONATE 0.12% ORAL RINSE (MEDLINE KIT)
15.0000 mL | Freq: Two times a day (BID) | OROMUCOSAL | Status: DC
  Administered 2021-07-31 – 2021-08-01 (×2): 15 mL via OROMUCOSAL

## 2021-07-31 MED ORDER — METOCLOPRAMIDE HCL 5 MG/ML IJ SOLN
10.0000 mg | Freq: Once | INTRAMUSCULAR | Status: AC
  Administered 2021-07-31: 10 mg via INTRAVENOUS
  Filled 2021-07-31: qty 2

## 2021-07-31 MED ORDER — GUAIFENESIN 100 MG/5ML PO LIQD: 5.0000 mL | ORAL | Status: DC | PRN

## 2021-07-31 MED ORDER — LORAZEPAM 2 MG/ML IJ SOLN: 4.0000 mg | INTRAMUSCULAR | Status: DC | PRN

## 2021-07-31 MED ORDER — METOPROLOL TARTRATE 5 MG/5ML IV SOLN: 5.0000 mg | INTRAVENOUS | Status: DC | PRN

## 2021-07-31 MED ORDER — INSULIN ASPART 100 UNIT/ML IJ SOLN
0.0000 [IU] | Freq: Three times a day (TID) | INTRAMUSCULAR | Status: DC
  Administered 2021-08-01 (×2): 3 [IU] via SUBCUTANEOUS
  Administered 2021-08-01: 5 [IU] via SUBCUTANEOUS

## 2021-07-31 MED ORDER — INSULIN ASPART 100 UNIT/ML IJ SOLN
0.0000 [IU] | Freq: Every day | INTRAMUSCULAR | Status: DC
  Administered 2021-07-31: 3 [IU] via SUBCUTANEOUS

## 2021-07-31 MED ORDER — ALUM & MAG HYDROXIDE-SIMETH 200-200-20 MG/5ML PO SUSP: 30.0000 mL | ORAL | Status: DC | PRN

## 2021-07-31 MED ORDER — DIAZEPAM 5 MG/ML IJ SOLN
5.0000 mg | Freq: Once | INTRAMUSCULAR | Status: AC
  Administered 2021-07-31: 5 mg via INTRAVENOUS
  Filled 2021-07-31: qty 2

## 2021-07-31 MED ORDER — HYDROCORTISONE 1 % EX CREA
1.0000 "application " | TOPICAL_CREAM | Freq: Three times a day (TID) | CUTANEOUS | Status: DC | PRN
  Filled 2021-07-31: qty 28

## 2021-07-31 MED ORDER — PHENOL 1.4 % MT LIQD
1.0000 | OROMUCOSAL | Status: DC | PRN
  Filled 2021-07-31: qty 177

## 2021-07-31 MED ORDER — HYDRALAZINE HCL 20 MG/ML IJ SOLN: 10.0000 mg | INTRAMUSCULAR | Status: DC | PRN

## 2021-07-31 MED ORDER — SENNOSIDES-DOCUSATE SODIUM 8.6-50 MG PO TABS: 1.0000 | ORAL_TABLET | Freq: Every evening | ORAL | Status: DC | PRN

## 2021-07-31 MED ORDER — SODIUM CHLORIDE 0.9 % IV SOLN: INTRAVENOUS | Status: AC

## 2021-07-31 MED ORDER — ORAL CARE MOUTH RINSE
15.0000 mL | OROMUCOSAL | Status: DC
  Administered 2021-07-31 – 2021-08-01 (×5): 15 mL via OROMUCOSAL

## 2021-07-31 MED ORDER — TRAZODONE HCL 50 MG PO TABS: 50.0000 mg | ORAL_TABLET | Freq: Every evening | ORAL | Status: DC | PRN

## 2021-07-31 MED ORDER — LORATADINE 10 MG PO TABS: 10.0000 mg | ORAL_TABLET | Freq: Every day | ORAL | Status: DC | PRN

## 2021-07-31 MED ORDER — MUSCLE RUB 10-15 % EX CREA
1.0000 "application " | TOPICAL_CREAM | CUTANEOUS | Status: DC | PRN
  Filled 2021-07-31: qty 85

## 2021-07-31 MED ORDER — HYDROCORTISONE (PERIANAL) 2.5 % EX CREA
1.0000 "application " | TOPICAL_CREAM | Freq: Four times a day (QID) | CUTANEOUS | Status: DC | PRN
  Filled 2021-07-31: qty 28.35

## 2021-07-31 MED ORDER — GADOBUTROL 1 MMOL/ML IV SOLN
10.0000 mL | Freq: Once | INTRAVENOUS | Status: AC | PRN
  Administered 2021-07-31: 10 mL via INTRAVENOUS

## 2021-07-31 MED ORDER — GABAPENTIN 300 MG PO CAPS
300.0000 mg | ORAL_CAPSULE | Freq: Three times a day (TID) | ORAL | Status: DC
  Administered 2021-07-31 – 2021-08-01 (×3): 300 mg via ORAL
  Filled 2021-07-31 (×3): qty 1

## 2021-07-31 MED ORDER — KETOROLAC TROMETHAMINE 15 MG/ML IJ SOLN
15.0000 mg | Freq: Once | INTRAMUSCULAR | Status: AC
Start: 2021-07-31 — End: 2021-07-31
  Administered 2021-07-31: 15 mg via INTRAVENOUS
  Filled 2021-07-31: qty 1

## 2021-07-31 MED ORDER — SALINE SPRAY 0.65 % NA SOLN
1.0000 | NASAL | Status: DC | PRN
  Filled 2021-07-31: qty 44

## 2021-07-31 MED ORDER — PROCHLORPERAZINE EDISYLATE 10 MG/2ML IJ SOLN: 10.0000 mg | Freq: Once | INTRAMUSCULAR | Status: DC

## 2021-07-31 MED ORDER — LIP MEDEX EX OINT
1.0000 "application " | TOPICAL_OINTMENT | CUTANEOUS | Status: DC | PRN
  Filled 2021-07-31: qty 7

## 2021-07-31 NOTE — H&P (Signed)
History and Physical    KAYREN HOLCK AGT:[id number] DOB: Feb 04, 1981 DOA: 07/31/2021  PCP: [name], NP Patient coming from: Home  Chief Complaint: Seizure  HPI: Andrea [name] is a 41 y.o. female with medical history significant of bipolar disorder, schizophrenia, DM2, essential hypertension, GERD, asthma comes to the hospital after having an episode of seizure.  Patient was in her usual state up until this morning when she went to work.  While at work she states she felt dizzy momentarily thereafter had a generalized tonic-clonic seizure which was witnessed by her coworkers lasting for about 4 minutes.  She was in a postictal state thereafter.  EMS was called and she was brought to the hospital.  By the time she arrived to the hospital her mentation was at baseline.  Husband was present as well.  Routine work-up including CT head was unremarkable.  Patient denies having similar episodes in the past.  No prior history of seizure. While in the ED she had another episode of seizure lasting for about 1 minute witnessed by her husband and it was mainly shaking of her upper extremities.  Patient was seen by neurology who recommended admitting the patient for routine work-up.   Review of Systems: As per HPI otherwise 10 point review of systems negative.  Review of Systems Otherwise negative except as per HPI, including: General: Denies fever, chills, night sweats or unintended weight loss. Resp: Denies cough, wheezing, shortness of breath. Cardiac: Denies chest pain, palpitations, orthopnea, paroxysmal nocturnal dyspnea. GI: Denies abdominal pain, nausea, vomiting, diarrhea or constipation GU: Denies dysuria, frequency, hesitancy or incontinence MS: Denies muscle aches, joint pain or swelling Neuro: Denies headache, neurologic deficits (focal weakness, numbness, tingling), abnormal gait Psych: Denies anxiety, depression, SI/HI/AVH Skin: Denies new rashes or lesions ID: Denies  sick contacts, exotic exposures, travel  Past Medical History:  Diagnosis Date   Anxiety    Bipolar 1 disorder ([place])    Complication of anesthesia    hard time waking up    Depression    Diabetes mellitus without complication ([place])    GERD (gastroesophageal reflux disease)    no meds   Kidney stones    Low back pain    Migraines    PCOS (polycystic ovarian syndrome)    PONV (postoperative nausea and vomiting)    Schizophrenia (HCC)    Shortness of breath dyspnea    with bronchitis    Past Surgical History:  Procedure Laterality Date   ABDOMINAL HYSTERECTOMY N/A 11/14/2015   Procedure: HYSTERECTOMY ABDOMINAL;  Surgeon: Olga Millers, MD;  Location: [facility];  Service: Gynecology;  Laterality: N/A;   CHOLECYSTECTOMY     INTRAUTERINE DEVICE (IUD) INSERTION  06/14/2014   [facility]   LYMPH NODES REMOVED     ovarian cyst removed     SALPINGOOPHORECTOMY Bilateral 11/14/2015   Procedure: SALPINGO OOPHORECTOMY;  Surgeon: Olga Millers, MD;  Location: [facility];  Service: Gynecology;  Laterality: Bilateral;    SOCIAL HISTORY:  reports that she has never smoked. She has never used smokeless tobacco. She reports that she does not drink alcohol and does not use drugs.  Allergies  Allergen Reactions   Bactrim [Sulfamethoxazole-Trimethoprim] Hives and Itching   Hydrocodone-Acetaminophen Itching   Vicodin [Hydrocodone-Acetaminophen] Hives and Itching    FAMILY HISTORY: Family History  Problem Relation Age of Onset   Healthy Mother    Healthy Father      Prior to Admission medications   Medication Sig Start Date  End Date Taking? Authorizing Provider  albuterol (VENTOLIN HFA) 108 (90 Base) MCG/ACT inhaler Inhale 1-2 puffs into the lungs every 6 (six) hours as needed for wheezing or shortness of breath. 04/15/21   Jaynee Eagles, PA-C  benzonatate (TESSALON) 100 MG capsule Take 1-2 capsules (100-200 mg total) by mouth 3 (three) times daily as needed for cough. 04/15/21    Jaynee Eagles, PA-C  BINAXNOW COVID-19 AG HOME TEST KIT See admin instructions. 08/31/20   [provider]  cephALEXin (KEFLEX) 500 MG capsule Take 1 capsule (500 mg total) by mouth 2 (two) times daily. 03/29/21   Volney American, PA-C  cetirizine (ZYRTEC ALLERGY) 10 MG tablet Take 1 tablet (10 mg total) by mouth daily. 04/15/21   Jaynee Eagles, PA-C  doxepin (SINEQUAN) 25 MG capsule Take 50 mg by mouth at bedtime. 09/05/20   [provider]  FLUoxetine (PROZAC) 20 MG capsule Take 1 capsule (20 mg total) by mouth daily. Patient taking differently: Take 60 mg by mouth daily. 08/14/17   Kathyrn Drown, MD  gabapentin (NEURONTIN) 300 MG capsule Take 1 capsule by mouth 2 (two) times daily. 09/05/20   [provider]  HYDROcodone-acetaminophen (NORCO) 7.5-325 MG tablet Take 1 tablet by mouth every 6 (six) hours as needed. for pain 07/31/20   [provider]  ibuprofen (ADVIL) 800 MG tablet Take 1 tablet (800 mg total) by mouth every 6 (six) hours as needed for moderate pain. 02/02/20   Pollina, Gwenyth Allegra, MD  JARDIANCE 10 MG TABS tablet Take 10 mg by mouth daily. 09/11/20   [provider]  lisinopril-hydrochlorothiazide (ZESTORETIC) 20-25 MG tablet Take 1 tablet by mouth daily.    [provider]  metoprolol succinate (TOPROL-XL) 25 MG 24 hr tablet Take 1 tablet by mouth daily. 08/17/20   [provider]  metroNIDAZOLE (FLAGYL) 500 MG tablet Take 1 tablet (500 mg total) by mouth 2 (two) times daily. 04/02/21   Hazel Sams, PA-C  OLANZapine (ZYPREXA) 10 MG tablet Take 10 mg by mouth in the morning and at bedtime.    [provider]  oxybutynin (DITROPAN-XL) 5 MG 24 hr tablet Take one tablet twice daily 09/04/17   Luking, Elayne Snare, MD  pantoprazole (PROTONIX) 40 MG tablet Take 1 tablet by mouth daily. 08/31/20   [provider]  prazosin (MINIPRESS) 2 MG capsule Take 2 mg by mouth 2 (two) times daily.    [provider]  promethazine-dextromethorphan (PROMETHAZINE-DM) 6.25-15 MG/5ML syrup Take 5 mLs by mouth at bedtime as needed for cough. 04/15/21   Jaynee Eagles, PA-C  pseudoephedrine (SUDAFED) 60 MG tablet Take 1 tablet (60 mg total) by mouth every 8 (eight) hours as needed for congestion. 04/15/21   Jaynee Eagles, PA-C  RYBELSUS 7 MG TABS Take 1 tablet by mouth daily. 09/17/20   [provider]  tiZANidine (ZANAFLEX) 4 MG tablet Take 4 mg by mouth every 8 (eight) hours as needed for muscle spasms.    [provider]  fluticasone (FLOVENT HFA) 110 MCG/ACT inhaler Inhale 2 puffs into the lungs 2 (two) times daily. For shortness of breath Patient not taking: Reported on 09/26/2020 12/24/16 10/27/20  Lindell Spar I, NP  metFORMIN (GLUCOPHAGE) 500 MG tablet Take 1 tablet (500 mg total) by mouth 2 (two) times daily with a meal. Patient not taking: No sig reported 01/01/20 10/27/20  Noemi Chapel, MD  trazodone (DESYREL) 300 MG tablet Take 300 mg by mouth at bedtime. Patient not taking: Reported on  09/26/2020  10/27/20  [provider]    Physical Exam: Vitals:   07/31/21 1400 07/31/21 1447 07/31/21 1500 07/31/21 1530  BP: (!) 128/95 131/88 92/70 107/81  Pulse: 70 72 75 74  Resp: _0 Temp:      TempSrc:      SpO2: 97% 96% 97% 98%  Weight:      Height:          Constitutional: NAD, calm, comfortable, morbid obesity Eyes: PERRL, lids and conjunctivae normal ENMT: Mucous membranes are moist. Posterior pharynx clear of any exudate or lesions.Normal dentition.  Neck: normal, supple, no masses, no thyromegaly Respiratory: clear to auscultation bilaterally, no wheezing, no crackles. Normal respiratory effort. No accessory muscle use.  Cardiovascular: Regular rate and rhythm, no murmurs / rubs / gallops. No extremity edema. 2+ pedal pulses. No carotid bruits.  Abdomen: no tenderness, no masses palpated. No hepatosplenomegaly. Bowel sounds positive.  Musculoskeletal: no clubbing /  cyanosis. No joint deformity upper and lower extremities. Good ROM, no contractures. Normal muscle tone.  Skin: no rashes, lesions, ulcers. No induration Neurologic: CN 2-12 grossly intact. Sensation intact, DTR normal. Strength 5/5 in all 4.  Psychiatric: Normal judgment and insight. Alert and oriented x 3. Normal mood.     Labs on Admission: I have personally reviewed following labs and imaging studies  CBC: Recent Labs  Lab 07/31/21 1308  WBC 6.6  HGB 13.0  HCT 41.9  MCV 97.0  PLT 382   Basic Metabolic Panel: Recent Labs  Lab 07/31/21 1308  NA 136  K 3.8  CL 99  CO2 23  GLUCOSE 265*  BUN 22*  CREATININE 1.21*  CALCIUM 9.5   GFR: Estimated Creatinine Clearance: 93.3 mL/min (A) (by C-G formula based on SCr of 1.21 mg/dL (H)). Liver Function Tests: No results for input(s): AST, ALT, ALKPHOS, BILITOT, PROT, ALBUMIN in the last 168 hours. No results for input(s): LIPASE, AMYLASE in the last 168 hours. No results for input(s): AMMONIA in the last 168 hours. Coagulation Profile: No results for input(s): INR, PROTIME in the last 168 hours. Cardiac Enzymes: No results for input(s): CKTOTAL, CKMB, CKMBINDEX, TROPONINI in the last 168 hours. BNP (last 3 results) No results for input(s): PROBNP in the last 8760 hours. HbA1C: No results for input(s): HGBA1C in the last 72 hours. CBG: Recent Labs  Lab 07/31/21 1329  GLUCAP 232*   Lipid Profile: No results for input(s): CHOL, HDL, LDLCALC, TRIG, CHOLHDL, LDLDIRECT in the last 72 hours. Thyroid Function Tests: No results for input(s): TSH, T4TOTAL, FREET4, T3FREE, THYROIDAB in the last 72 hours. Anemia Panel: No results for input(s): VITAMINB12, FOLATE, FERRITIN, TIBC, IRON, RETICCTPCT in the last 72 hours. Urine analysis:    Component Value Date/Time   COLORURINE STRAW (A) 07/31/2021 1256   APPEARANCEUR CLEAR 07/31/2021 1256   LABSPEC 1.028 07/31/2021 1256   PHURINE 5.0 07/31/2021 1256   GLUCOSEU >=500 (A)  07/31/2021 1256   HGBUR NEGATIVE 07/31/2021 1256   BILIRUBINUR NEGATIVE 07/31/2021 1256   BILIRUBINUR negative 03/29/2021 1408   BILIRUBINUR ++ 12/31/2016 1042   KETONESUR NEGATIVE 07/31/2021 1256   PROTEINUR NEGATIVE 07/31/2021 1256   UROBILINOGEN 0.2 03/29/2021 1408   UROBILINOGEN 0.2 04/25/2014 1315   NITRITE NEGATIVE 07/31/2021 1256   LEUKOCYTESUR NEGATIVE 07/31/2021 1256   Sepsis Labs: !!!!!!!!!!!!!!!!!!!!!!!!!!!!!!!!!!!!!!!!!!!! _1 (procalcitonin:4,lacticidven:4) )No results found for this or any previous visit (from the past 240 hour(s)).   Radiological Exams on Admission: CT Head Wo Contrast  Result Date: 07/31/2021 CLINICAL  DATA:  Seizure EXAM: CT HEAD WITHOUT CONTRAST TECHNIQUE: Contiguous axial images were obtained from the base of the skull through the vertex without intravenous contrast. RADIATION DOSE REDUCTION: This exam was performed according to the departmental dose-optimization program which includes automated exposure control, adjustment of the mA and/or kV according to patient size and/or use of iterative reconstruction technique. COMPARISON:  CT head 02/03/2005 FINDINGS: Brain: No acute intracranial hemorrhage, mass effect, or herniation. No extra-axial fluid collections. No evidence of acute territorial infarct. No hydrocephalus. Vascular: No hyperdense vessel or unexpected calcification. Skull: Normal. Negative for fracture or focal lesion. Sinuses/Orbits: No acute finding. Other: None. IMPRESSION: Electronically Signed   By: Ofilia Neas M.D.   On: 07/31/2021 14:29      Assessment/Plan Principal Problem:   Seizure [facility]) Active Problems:   Morbid obesity ([place])   Depression with anxiety   Type 2 diabetes mellitus with hyperglycemia (HCC)   HTN (hypertension)   Bipolar 1 disorder, mixed, severe (HCC)    Generalized tonic-clonic seizure - No prior history.  Admitted to the hospital for further management.  CT of the head is negative.  Will check MRI  brain, EEG.  Neurology has been consulted.  Seizure order set used.  As needed medications placed.  Seizure precautions.  IV fluids.  UA is negative.  Check UDS.  Mild renal insufficiency - Creatinine 1.21, unknown baseline.  Check CK levels.  IV fluids.  Diabetes mellitus type 2 - Awaiting pharmacy to confirm her medications.  Sliding scale and Accu-Cheks.  Essential hypertension - IV as needed hydralazine and Lopressor.  Awaiting pharmacy to confirm meds  History of bipolar 1/general anxiety disorder Schizophrenia - Awaiting pharmacy to confirm her medications.      DVT prophylaxis: Lovenox Code Status: Full code Family Communication: None Consults called: Neurology called by ED Admission status: Observation admission  Status is: Observation The patient remains OBS appropriate and will d/c before 2 midnights.         Time Spent: 65 minutes.  >50% of the time was devoted to discussing the patients care, assessment, plan and disposition with other care givers along with counseling the patient about the risks and benefits of treatment.    Earlie Arciga Arsenio Loader MD [facility]  If 7PM-7AM, please contact night-coverage   07/31/2021, 4:18 PM

## 2021-07-31 NOTE — ED Triage Notes (Signed)
Pt brought to ED via [facility] for syncopal episode. Pt states she was standing at work, started feeling dizzy sat down and had syncopal episode lasting 2 minutes, slid out of chair. Pt c/o headache. No seizure like activity noted. VSS per EMS. CBG 127

## 2021-07-31 NOTE — ED Notes (Signed)
Patient transported to MRI 

## 2021-07-31 NOTE — ED Provider Notes (Signed)
[facility] EMERGENCY DEPARTMENT Provider Note   CSN: [id number] Arrival date & time: 07/31/21  1240     History  Chief Complaint  Patient presents with   Loss of Consciousness    Andrea [name] is a 41 y.o. female with medical history significant for schizophrenia, anxiety, bipolar disorder, depression, diabetes, GERD, migraines.  Patient presents to ED via EMS for evaluation of syncopal event at work earlier today.  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  She reports that she had a largely normal morning, following her typical routine.  She reports that at work she began to feel lightheaded at which point she made her manager aware who advised her to sit in a chair.  Patient states that after sitting in the chair she attempted to go back to work and stood up but at this time her manager noted that "all the color ran from my face".  The patient laid back down on the floor in a controlled manner, did not strike her head.  Patient coworkers noted at this time patient had 4 minutes of seizure-like activity that included tonic-clonic jerking.  Patient denies any urinary or bowel incontinence at this time.  Patient reports that she had confusion on waking.  Patient denies any history of seizures, denies any family history of seizures.  Patient reports that she had a fever on Sunday but it resolved without treatment.  Patient endorsing syncopal episode, recent fever, cough, headache.  Patient denies chest pain, shortness of breath, abdominal pain, nausea, vomiting, diarrhea, lightheadedness, dizziness, weakness.   Loss of Consciousness Associated symptoms: dizziness, fever, headaches, seizures and weakness   Associated symptoms: no chest pain, no nausea, no shortness of breath and no vomiting       Home Medications Prior to Admission medications   Medication Sig Start Date End Date Taking?  Authorizing Provider  albuterol (VENTOLIN HFA) 108 (90 Base) MCG/ACT inhaler Inhale 1-2 puffs into the lungs every 6 (six) hours as needed for wheezing or shortness of breath. 04/15/21   Jaynee Eagles, PA-C  benzonatate (TESSALON) 100 MG capsule Take 1-2 capsules (100-200 mg total) by mouth 3 (three) times daily as needed for cough. 04/15/21   Jaynee Eagles, PA-C  BINAXNOW COVID-19 AG HOME TEST KIT See admin instructions. 08/31/20   [provider]  cephALEXin (KEFLEX) 500 MG capsule Take 1 capsule (500 mg total) by mouth 2 (two) times daily. 03/29/21   Volney American, PA-C  cetirizine (ZYRTEC ALLERGY) 10 MG tablet Take 1 tablet (10 mg total) by mouth daily. 04/15/21   Jaynee Eagles, PA-C  doxepin (SINEQUAN) 25 MG capsule Take 50 mg by mouth at bedtime. 09/05/20   [provider]  FLUoxetine (PROZAC) 20 MG capsule Take 1 capsule (20 mg total) by mouth daily. Patient taking differently: Take 60 mg by mouth daily. 08/14/17   Kathyrn Drown, MD  gabapentin (NEURONTIN) 300 MG capsule Take 1 capsule by mouth 2 (two) times daily. 09/05/20   [provider]  HYDROcodone-acetaminophen (NORCO) 7.5-325 MG tablet Take 1 tablet by mouth every 6 (six) hours as needed. for pain 07/31/20   [provider]  ibuprofen (ADVIL) 800 MG tablet Take 1 tablet (800 mg total) by mouth every 6 (six) hours as needed for moderate pain. 02/02/20   Pollina, Gwenyth Allegra, MD  JARDIANCE 10 MG TABS tablet Take 10 mg by mouth daily. 09/11/20  [provider]  lisinopril-hydrochlorothiazide (ZESTORETIC) 20-25 MG tablet Take 1 tablet by mouth daily.    [provider]  metoprolol succinate (TOPROL-XL) 25 MG 24 hr tablet Take 1 tablet by mouth daily. 08/17/20   [provider]  metroNIDAZOLE (FLAGYL) 500 MG tablet Take 1 tablet (500 mg total) by mouth 2 (two) times daily. 04/02/21   Hazel Sams, PA-C  OLANZapine (ZYPREXA) 10 MG tablet Take 10 mg by mouth in the morning and  at bedtime.    [provider]  oxybutynin (DITROPAN-XL) 5 MG 24 hr tablet Take one tablet twice daily 09/04/17   Luking, Elayne Snare, MD  pantoprazole (PROTONIX) 40 MG tablet Take 1 tablet by mouth daily. 08/31/20   [provider]  prazosin (MINIPRESS) 2 MG capsule Take 2 mg by mouth 2 (two) times daily.    [provider]  promethazine-dextromethorphan (PROMETHAZINE-DM) 6.25-15 MG/5ML syrup Take 5 mLs by mouth at bedtime as needed for cough. 04/15/21   Jaynee Eagles, PA-C  pseudoephedrine (SUDAFED) 60 MG tablet Take 1 tablet (60 mg total) by mouth every 8 (eight) hours as needed for congestion. 04/15/21   Jaynee Eagles, PA-C  RYBELSUS 7 MG TABS Take 1 tablet by mouth daily. 09/17/20   [provider]  tiZANidine (ZANAFLEX) 4 MG tablet Take 4 mg by mouth every 8 (eight) hours as needed for muscle spasms.    [provider]  fluticasone (FLOVENT HFA) 110 MCG/ACT inhaler Inhale 2 puffs into the lungs 2 (two) times daily. For shortness of breath Patient not taking: Reported on 09/26/2020 12/24/16 10/27/20  Lindell Spar I, NP  metFORMIN (GLUCOPHAGE) 500 MG tablet Take 1 tablet (500 mg total) by mouth 2 (two) times daily with a meal. Patient not taking: No sig reported 01/01/20 10/27/20  Noemi Chapel, MD  trazodone (DESYREL) 300 MG tablet Take 300 mg by mouth at bedtime. Patient not taking: Reported on 09/26/2020  10/27/20  [provider]      Allergies    Bactrim [sulfamethoxazole-trimethoprim], Hydrocodone-acetaminophen, and Vicodin [hydrocodone-acetaminophen]    Review of Systems   Review of Systems  Constitutional:  Positive for chills and fever.  Respiratory:  Negative for shortness of breath.   Cardiovascular:  Positive for syncope. Negative for chest pain.  Gastrointestinal:  Negative for abdominal pain, diarrhea, nausea and vomiting.  Musculoskeletal:  Negative for neck pain.  Neurological:  Positive for dizziness, seizures, syncope, weakness and  headaches.  All other systems reviewed and are negative.  Physical Exam Updated Vital Signs BP 107/81    Pulse 74    Temp 98.6 F (37 C) (Oral)    Resp 18    Ht 6' (1.829 m)    Wt 129.3 kg    LMP  (LMP Unknown)    SpO2 98%    BMI 38.65 kg/m  Physical Exam Vitals and nursing note reviewed.  Constitutional:      General: She is not in acute distress.    Appearance: She is obese. She is not ill-appearing, toxic-appearing or diaphoretic.  HENT:     Head: Normocephalic and atraumatic.     Nose: Nose normal.     Mouth/Throat:     Mouth: Mucous membranes are moist.  Eyes:     Extraocular Movements: Extraocular movements intact.     Conjunctiva/sclera: Conjunctivae normal.     Pupils: Pupils are equal, round, and reactive to light.  Cardiovascular:     Rate and Rhythm: Normal rate and regular rhythm.  Pulmonary:  Effort: Pulmonary effort is normal.     Breath sounds: Normal breath sounds. No wheezing.  Abdominal:     General: Abdomen is flat. There is no distension.     Palpations: Abdomen is soft.     Tenderness: There is no abdominal tenderness.  Musculoskeletal:     Cervical back: Normal range of motion and neck supple. No rigidity or tenderness.  Skin:    General: Skin is warm and dry.     Capillary Refill: Capillary refill takes less than 2 seconds.  Neurological:     Mental Status: She is alert and oriented to person, place, and time.     GCS: GCS eye subscore is 4. GCS verbal subscore is 5. GCS motor subscore is 6.     Cranial Nerves: Cranial nerves 2-12 are intact. No cranial nerve deficit or dysarthria.     Sensory: Sensation is intact. No sensory deficit.     Motor: Motor function is intact. No weakness.     Coordination: Coordination is intact. Coordination normal. Heel to Shin Test normal.    ED Results / Procedures / Treatments   Labs (all labs ordered are listed, but only abnormal results are displayed) Labs Reviewed  BASIC METABOLIC PANEL - Abnormal; Notable  for the following components:      Result Value   Glucose, Bld 265 (*)    BUN 22 (*)    Creatinine, Ser 1.21 (*)    GFR, Estimated 58 (*)    All other components within normal limits  URINALYSIS, ROUTINE W REFLEX MICROSCOPIC - Abnormal; Notable for the following components:   Color, Urine STRAW (*)    Glucose, UA >=500 (*)    All other components within normal limits  CBG MONITORING, ED - Abnormal; Notable for the following components:   Glucose-Capillary 232 (*)    All other components within normal limits  CBC  TSH  I-STAT BETA HCG BLOOD, ED ([facility], [facility], [facility] ONLY)    EKG EKG Interpretation  Date/Time:  Tuesday July 31 2021 13:16:26 EST Ventricular Rate:  70 PR Interval:  168 QRS Duration: 80 QT Interval:  398 QTC Calculation: 429 R Axis:   48 Text Interpretation: Normal sinus rhythm Cannot rule out Anterior infarct (cited on or before 01-Jan-2020) Abnormal ECG When compared with ECG of 01-Jan-2020 08:17, No significant change was found Confirmed by Godfrey Pick ([id number]) on 07/31/2021 2:41:34 PM  Radiology CT Head Wo Contrast  Result Date: 07/31/2021 CLINICAL DATA:  Seizure EXAM: CT HEAD WITHOUT CONTRAST TECHNIQUE: Contiguous axial images were obtained from the base of the skull through the vertex without intravenous contrast. RADIATION DOSE REDUCTION: This exam was performed according to the departmental dose-optimization program which includes automated exposure control, adjustment of the mA and/or kV according to patient size and/or use of iterative reconstruction technique. COMPARISON:  CT head 02/03/2005 FINDINGS: Brain: No acute intracranial hemorrhage, mass effect, or herniation. No extra-axial fluid collections. No evidence of acute territorial infarct. No hydrocephalus. Vascular: No hyperdense vessel or unexpected calcification. Skull: Normal. Negative for fracture or focal lesion. Sinuses/Orbits: No acute finding. Other: None. IMPRESSION: Electronically Signed   By: Ofilia Neas M.D.   On: 07/31/2021 14:29    Procedures Procedures    Medications Ordered in ED Medications  ipratropium-albuterol (DUONEB) 0.5-2.5 (3) MG/3ML nebulizer solution 3 mL (has no administration in time range)  hydrALAZINE (APRESOLINE) injection 10 mg (has no administration in time range)  metoprolol tartrate (LOPRESSOR) injection 5 mg (has no administration in time  range)  senna-docusate (Senokot-S) tablet 1 tablet (has no administration in time range)  guaiFENesin (ROBITUSSIN) 100 MG/5ML liquid 5 mL (has no administration in time range)  traZODone (DESYREL) tablet 50 mg (has no administration in time range)  acetaminophen (TYLENOL) tablet 650 mg (has no administration in time range)  oxyCODONE (Oxy IR/ROXICODONE) immediate release tablet 5 mg (has no administration in time range)  loratadine (CLARITIN) tablet 10 mg (has no administration in time range)  lip balm (CARMEX) ointment 1 application (has no administration in time range)  polyvinyl alcohol (LIQUIFILM TEARS) 1.4 % ophthalmic solution 1 drop (has no administration in time range)  hydrocortisone (ANUSOL-HC) 2.5 % rectal cream 1 application (has no administration in time range)  alum & mag hydroxide-simeth (MAALOX/MYLANTA) 200-200-20 MG/5ML suspension 30 mL (has no administration in time range)  hydrocortisone cream 1 % 1 application (has no administration in time range)  Muscle Rub CREA 1 application (has no administration in time range)  sodium chloride (OCEAN) 0.65 % nasal spray 1 spray (has no administration in time range)  phenol (CHLORASEPTIC) mouth spray 1 spray (has no administration in time range)  0.9 %  sodium chloride infusion (has no administration in time range)  sodium chloride 0.9 % bolus 1,000 mL (0 mLs Intravenous Stopped 07/31/21 1433)  diazepam (VALIUM) injection 5 mg (5 mg Intravenous Given 07/31/21 1454)    ED Course/ Medical Decision Making/ A&P                           Medical Decision  Making Amount and/or Complexity of Data Reviewed Labs: ordered. Radiology: ordered.   47-year-old female with significant psychiatric history presents due to supposed seizure-like activity that occurred at work earlier today.  Patient had 4-minute episode of seizure-like activity noted by her coworkers along with postictal confusion per EMS.  On examination, the patient is afebrile, nontachycardic, not hypoxic, nontoxic in appearance, alert and oriented x3, unremarkable neuro exam.  The patient has soft compressible abdomen, clear lung sounds bilaterally.  Patient denies any changes to medication dosages or addition of new medications to her regimen.   While here in the ED, patient has had the following studies and imaging studies conducted and interpreted by me: CBG 232 CBC unremarkable BMP shows decreased GFR to 58, increased creatinine 1.21, increased BUN of 22.  This most likely secondary to decreased oral intake, prerenal cause of AKI.  Patient has been treated with 1 L of fluid. Patient UA unremarkable Patient CT head does not show any signs of mass effect, herniation, intracranial abnormality Patient EKG is sinus rhythm  While working this patient up, nursing staff notified me that patient had 1 minute of tonic-clonic upper extremity seizure-like activity.  This was witnessed by the patient's husband who is at the bedside who notified nursing staff.  When I made contact with patient at the bedside, she was alert and oriented x4, there is no bowel or bladder incontinence noted, no tongue lacerations.  Her neuro exam was also unremarkable at this time.  Update: At 3:50 PM, nursing staff notified provider that patient had had a second seizure while in department.  Patient had similar tonic-clonic upper extremity movement, was supposedly postictal, seizure was witnessed by her husband.  Patient has been treated with 5 mg of Valium 30 minutes prior to the onset of the seizure.  At this time,  the patient has been brought to the attention of the on-call neurologist for consult, Dr.  Collins.  The neurologist has indicated that he would like the patient to be admitted for an MRI/EEG work-up study.  Hospitalist has been contacted, Dr. Reesa Chew has agreed to admit the patient.    Patient stable at time of admission.  Final Clinical Impression(s) / ED Diagnoses Final diagnoses:  Syncope and collapse    Rx / DC Orders ED Discharge Orders     None         Lawana Chambers 07/31/21 1616    Godfrey Pick, MD 08/03/21 1348

## 2021-07-31 NOTE — ED Notes (Addendum)
Seizure-like activity that occurred while patient is laying in bed lasting 15-20 seconds with upper body jerking including head. Dr. Doren Custard notified. Pt reports that she has a headache that started after she arrived to the hospital. Pt reports that she felt dizzy prior to the seizure-like activity.

## 2021-07-31 NOTE — ED Notes (Signed)
This nurse called Acute Care and offered to give report. Candace will return call to ED when ready to take report.

## 2021-07-31 NOTE — ED Notes (Signed)
Pt placed on cardiac monitor with BP to set cycle every 30 minutes. Continuous pulse oximeter applied.  

## 2021-07-31 NOTE — Consult Note (Signed)
[facility] Telemedicine Consult   Requesting Provider: Reesa Chew, A Consult Participants: patient, husband.    This consult was provided via telemedicine with 2-way video and audio communication. The patient/family was informed that care would be provided in this way and agreed to receive care in this manner.    Chief Complaint: Seizure  HPI: 41 yo F who was in her normal state of health until around 11 am when she felt lightheadedness. She states that she sat down and then subsequently had a 4 minute long episode of jerking(this was told to her, she does not remember it. She does not remember the episode. She next remembers being at the hospital. She did not have bowel or bladder incontinence to her knowledge. On arrival to the emergency department, she was seen to have another two episodes.     LKW: 11 am tpa given?: No, mild symptoms  IR Thrombectomy? No, mild symptoms  Exam: Vitals:   07/31/21 1500 07/31/21 1530  BP: 92/70 107/81  Pulse: 75 74  Resp: 16 18  Temp:    SpO2: 97% 98%    General: in bed, NAD  1A: Level of Consciousness - 0 1B: Ask Month and Age - 0 1C: 'Blink Eyes' & 'Squeeze Hands' - 0 2: Test Horizontal Extraocular Movements - 0 3: Test Visual Fields - 0 4: Test Facial Palsy - 0 5A: Test Left Arm Motor Drift - 0 5B: Test Right Arm Motor Drift - 0 6A: Test Left Leg Motor Drift - 1 6B: Test Right Leg Motor Drift - 0 7: Test Limb Ataxia - 0 8: Test Sensation - 0 9: Test Language/Aphasia- 0 10: Test Dysarthria - 0 11: Test Extinction/Inattention - 0 NIHSS score: 1   Imaging Reviewed: CT head - negative.   Labs reviewed in epic and pertinent values follow: Na 136  Cr 1.21 Ca 9.5  WBC 6.6  Hgb 13  TSH 2.1  Mg pending  Assessment: 41 yo F with new onset seizures.  She has had a total of three seizures, resolved with diazepam.  At this time, though we typically any seizures in a 24-hour period as a single event for the purpose of  starting antiepileptics, given that she is already on gabapentin I think that increasing this to a therapeutic dose may be a reasonable intervention.  Etiology is currently not clear, and she will need MRI as well as EEG.  Magnesium is also pending  Recommendations:  1) MRI brain with and without contrast 2) EEG 3) increase gabapentin to 300 mg 3 times daily 4) Patient is unable to drive, operate heavy machinery, perform activities at heights or participate in water activities until release by outpatient physician. This was discussed with the patient who expressed understanding.    Roland Rack, MD [facility] [phone]  If 7pm- 7am, please page neurology on call as listed in [facility].

## 2021-08-01 ENCOUNTER — Inpatient Hospital Stay (HOSPITAL_COMMUNITY)
Admit: 2021-08-01 | Discharge: 2021-08-01 | Disposition: A | Discharge status: Home / Self Care | Payer: No Typology Code available for payment source | Attending: Internal Medicine | Admitting: Internal Medicine

## 2021-08-01 DIAGNOSIS — I10 Essential (primary) hypertension: Secondary | ICD-10-CM | POA: Diagnosis present

## 2021-08-01 DIAGNOSIS — R55 Syncope and collapse: Secondary | ICD-10-CM

## 2021-08-01 DIAGNOSIS — F3163 Bipolar disorder, current episode mixed, severe, without psychotic features: Secondary | ICD-10-CM | POA: Diagnosis present

## 2021-08-01 DIAGNOSIS — F418 Other specified anxiety disorders: Secondary | ICD-10-CM

## 2021-08-01 DIAGNOSIS — E1165 Type 2 diabetes mellitus with hyperglycemia: Secondary | ICD-10-CM | POA: Diagnosis present

## 2021-08-01 DIAGNOSIS — G4089 Other seizures: Secondary | ICD-10-CM | POA: Diagnosis present

## 2021-08-01 DIAGNOSIS — R569 Unspecified convulsions: Secondary | ICD-10-CM | POA: Diagnosis not present

## 2021-08-01 DIAGNOSIS — Z79899 Other long term (current) drug therapy: Secondary | ICD-10-CM | POA: Diagnosis not present

## 2021-08-01 DIAGNOSIS — N289 Disorder of kidney and ureter, unspecified: Secondary | ICD-10-CM | POA: Diagnosis present

## 2021-08-01 DIAGNOSIS — F411 Generalized anxiety disorder: Secondary | ICD-10-CM | POA: Diagnosis present

## 2021-08-01 DIAGNOSIS — K219 Gastro-esophageal reflux disease without esophagitis: Secondary | ICD-10-CM | POA: Diagnosis present

## 2021-08-01 DIAGNOSIS — Z20822 Contact with and (suspected) exposure to covid-19: Secondary | ICD-10-CM | POA: Diagnosis present

## 2021-08-01 DIAGNOSIS — Z7984 Long term (current) use of oral hypoglycemic drugs: Secondary | ICD-10-CM | POA: Diagnosis not present

## 2021-08-01 DIAGNOSIS — J45909 Unspecified asthma, uncomplicated: Secondary | ICD-10-CM | POA: Diagnosis present

## 2021-08-01 LAB — MAGNESIUM: Magnesium: 1.9 mg/dL (ref 1.7–2.4)

## 2021-08-01 LAB — GLUCOSE, CAPILLARY
Glucose-Capillary: 189 mg/dL — ABNORMAL HIGH (ref 70–99)
Glucose-Capillary: 219 mg/dL — ABNORMAL HIGH (ref 70–99)
Glucose-Capillary: 193 mg/dL — ABNORMAL HIGH (ref 70–99)

## 2021-08-01 LAB — CBC
HCT: 37.9 % (ref 36.0–46.0)
Hemoglobin: 12 g/dL (ref 12.0–15.0)
MCH: 31.3 pg (ref 26.0–34.0)
MCHC: 31.7 g/dL (ref 30.0–36.0)
MCV: 99 fL (ref 80.0–100.0)
Platelets: 171 10*3/uL (ref 150–400)
RBC: 3.83 MIL/uL — ABNORMAL LOW (ref 3.87–5.11)
RDW: 12.8 % (ref 11.5–15.5)
WBC: 6.2 10*3/uL (ref 4.0–10.5)
nRBC: 0 % (ref 0.0–0.2)

## 2021-08-01 LAB — BASIC METABOLIC PANEL
Anion gap: 15 (ref 5–15)
BUN: 20 mg/dL (ref 6–20)
CO2: 23 mmol/L (ref 22–32)
Calcium: 9.4 mg/dL (ref 8.9–10.3)
Chloride: 101 mmol/L (ref 98–111)
Creatinine, Ser: 0.89 mg/dL (ref 0.44–1.00)
GFR, Estimated: >60 mL/min (ref >60)
Glucose, Bld: 201 mg/dL — ABNORMAL HIGH (ref 70–99)
Potassium: 3.8 mmol/L (ref 3.5–5.1)
Sodium: 139 mmol/L (ref 135–145)

## 2021-08-01 LAB — HIV ANTIBODY (ROUTINE TESTING W REFLEX): HIV Screen 4th Generation wRfx: NONREACTIVE

## 2021-08-01 MED ORDER — GABAPENTIN 300 MG PO CAPS: 300.0000 mg | ORAL_CAPSULE | Freq: Three times a day (TID) | ORAL | 0 refills | Status: DC

## 2021-08-01 NOTE — Progress Notes (Signed)
EEG complete - results pending 

## 2021-08-01 NOTE — Progress Notes (Incomplete)
PROGRESS NOTE    Andrea [name]  FYB:[id number] DOB: [birth year] DOA: 07/31/2021 PCP: [name], NP     Brief Narrative:  Andrea [name] is a 41 y.o. WF PMHx bipolar disorder, schizophrenia, DM2, essential hypertension, GERD, asthma   Comes to the hospital after having an episode of seizure.  Patient was in her usual state up until this morning when she went to work.  While at work she states she felt dizzy momentarily thereafter had a generalized tonic-clonic seizure which was witnessed by her coworkers lasting for about 4 minutes.  She was in a postictal state thereafter.  EMS was called and she was brought to the hospital.  By the time she arrived to the hospital her mentation was at baseline.  Husband was present as well.  Routine work-up including CT head was unremarkable.  Patient denies having similar episodes in the past.  No prior history of seizure. While in the ED she had another episode of seizure lasting for about 1 minute witnessed by her husband and it was mainly shaking of her upper extremities.  Patient was seen by neurology who recommended admitting the patient for routine work-up.   Subjective: Afebrile overnight   Assessment & Plan: Covid vaccination;   Principal Problem:   Seizure ([place]) Active Problems:   Morbid obesity ([place])   Depression with anxiety   Type 2 diabetes mellitus with hyperglycemia (HCC)   HTN (hypertension)   Bipolar 1 disorder, mixed, severe (HCC)  Generalized tonic-clonic seizure - No prior history.  Admitted to the hospital for further management.  CT of the head is negative.  Will check MRI brain, EEG.  Neurology has been consulted.  Seizure order set used.  As needed medications placed.  Seizure precautions.  IV fluids.  UA is negative.  Check UDS. -Per neurology gabapentin 300 mg TID -Patient is unable to drive, operate heavy machinery, perform activities at heights or participate in water activities until release by  outpatient physician.  Per Neurology Dr. Roland Rack was discussed with the patient who expressed understanding.  -2/15 EEG pending -2/15 have placed request for 2-week follow-up with [facility] neurology secondary to new onset seizures.  They should call patient to set up appointment   Mild renal insufficiency - Creatinine 1.21, unknown baseline.  Check CK levels.  IV fluids.   Diabetes mellitus type 2 - Awaiting pharmacy to confirm her medications.  Sliding scale and Accu-Cheks.   Essential hypertension - IV as needed hydralazine and Lopressor.  Awaiting pharmacy to confirm meds   History of bipolar 1/general anxiety disorder Schizophrenia - Awaiting pharmacy to confirm her medications.             Body mass index is 38.65 kg/m.  ***   DVT prophylaxis: *** Code Status: *** Family Communication: *** Status is: Inpatient  {Inpatient:23812}  Dispo: The patient is from: {From:23814}              Anticipated d/c is to: {To:23815}              Anticipated d/c date is: {Days:23816}              Patient currently {Medically stable:23817}      Consultants:  ***  Procedures/Significant Events:  ***  I have personally reviewed and interpreted all radiology studies and my findings are as above.  VENTILATOR SETTINGS: ***   Cultures ***  Antimicrobials: ***   Devices ***   LINES / TUBES:  ***  Continuous Infusions:  sodium chloride 75 mL/hr at 08/01/21 0741     Objective: Vitals:   07/31/21 1900 07/31/21 1930 07/31/21 2118 08/01/21 0415  BP: 111/68 124/74 134/81 135/85  Pulse: 80 79 71 65  Resp: 17 (!) 23 20 18   Temp:   98.5 F (36.9 C) 97.8 F (36.6 C)  TempSrc:    Oral  SpO2: 96% 96% 96% 95%  Weight:      Height:        Intake/Output Summary (Last 24 hours) at 08/01/2021 0815 Last data filed at 08/01/2021 0741 Gross per 24 hour  Intake 1049.22 ml  Output --  Net 1049.22 ml   Filed Weights   07/31/21 1252  Weight: 129.3  kg    Examination:  General: No acute respiratory distress Eyes: negative scleral hemorrhage, negative anisocoria, negative icterus*** ENT: Negative Runny nose, negative gingival bleeding,*** Neck:  Negative scars, masses, torticollis, lymphadenopathy, JVD*** Lungs: Clear to auscultation bilaterally without wheezes or crackles Cardiovascular: Regular rate and rhythm without murmur gallop or rub normal S1 and S2 Abdomen: negative abdominal pain, nondistended, positive soft, bowel sounds, no rebound, no ascites, no appreciable mass Extremities: No significant cyanosis, clubbing, or edema bilateral lower extremities Skin: Negative rashes, lesions, ulcers*** Psychiatric:  Negative depression, negative anxiety, negative fatigue, negative mania *** Central nervous system:  Cranial nerves II through XII intact, tongue/uvula midline, all extremities muscle strength 5/5, sensation intact throughout, finger nose finger bilateral within normal limits, quick finger touch bilateral within normal limits, negative Romberg sign, heel to shin bilateral within normal limits, standing on 1 foot bilateral within normal limits, walking on tiptoes within normal limits, walking on heels within normal limits, negative dysarthria, negative expressive aphasia, negative receptive aphasia.***  .     Data Reviewed: Care during the described time interval was provided by me .  I have reviewed this patient's available data, including medical history, events of note, physical examination, and all test results as part of my evaluation.  CBC: Recent Labs  Lab 07/31/21 1308 08/01/21 0507  WBC 6.6 6.2  HGB 13.0 12.0  HCT 41.9 37.9  MCV 97.0 99.0  PLT 185 774   Basic Metabolic Panel: Recent Labs  Lab 07/31/21 1308 08/01/21 0507  NA 136 139  K 3.8 3.8  CL 99 101  CO2 23 23  GLUCOSE 265* 201*  BUN 22* 20  CREATININE 1.21* 0.89  CALCIUM 9.5 9.4  MG  --  1.9   GFR: Estimated Creatinine Clearance: 126.8  mL/min (by C-G formula based on SCr of 0.89 mg/dL). Liver Function Tests: No results for input(s): AST, ALT, ALKPHOS, BILITOT, PROT, ALBUMIN in the last 168 hours. No results for input(s): LIPASE, AMYLASE in the last 168 hours. No results for input(s): AMMONIA in the last 168 hours. Coagulation Profile: No results for input(s): INR, PROTIME in the last 168 hours. Cardiac Enzymes: Recent Labs  Lab 07/31/21 1301  CKTOTAL 79   BNP (last 3 results) No results for input(s): PROBNP in the last 8760 hours. HbA1C: Recent Labs    07/31/21 1308  HGBA1C 7.5*   CBG: Recent Labs  Lab 07/31/21 1329 07/31/21 2120 08/01/21 0738  GLUCAP 232* 256* 189*   Lipid Profile: No results for input(s): CHOL, HDL, LDLCALC, TRIG, CHOLHDL, LDLDIRECT in the last 72 hours. Thyroid Function Tests: Recent Labs    07/31/21 1308  TSH 2.132   Anemia Panel: No results for input(s): VITAMINB12, FOLATE, FERRITIN, TIBC, IRON, RETICCTPCT in the last 72 hours. Sepsis Labs:  No results for input(s): PROCALCITON, LATICACIDVEN in the last 168 hours.  Recent Results (from the past 240 hour(s))  Resp Panel by RT-PCR (Flu A&B, Covid) Nasopharyngeal Swab     Status: None   Collection Time: 07/31/21  4:44 PM   Specimen: Nasopharyngeal Swab; Nasopharyngeal(NP) swabs in vial transport medium  Result Value Ref Range Status   SARS Coronavirus 2 by RT PCR NEGATIVE NEGATIVE Final    Comment: (NOTE) SARS-CoV-2 target nucleic acids are NOT DETECTED.  The SARS-CoV-2 RNA is generally detectable in upper respiratory specimens during the acute phase of infection. The lowest concentration of SARS-CoV-2 viral copies this assay can detect is 138 copies/mL. A negative result does not preclude SARS-Cov-2 infection and should not be used as the sole basis for treatment or other patient management decisions. A negative result may occur with  improper specimen collection/handling, submission of specimen other than nasopharyngeal  swab, presence of viral mutation(s) within the areas targeted by this assay, and inadequate number of viral copies(<138 copies/mL). A negative result must be combined with clinical observations, patient history, and epidemiological information. The expected result is Negative.  Fact Sheet for Patients:  EntrepreneurPulse.com.au  Fact Sheet for Healthcare Providers:  IncredibleEmployment.be  This test is no t yet approved or cleared by the [place] FDA and  has been authorized for detection and/or diagnosis of SARS-CoV-2 by FDA under an Emergency Use Authorization (EUA). This EUA will remain  in effect (meaning this test can be used) for the duration of the COVID-19 declaration under Section 564(b)(1) of the Act, 21 U.S.C.section 360bbb-3(b)(1), unless the authorization is terminated  or revoked sooner.       Influenza A by PCR NEGATIVE NEGATIVE Final   Influenza B by PCR NEGATIVE NEGATIVE Final    Comment: (NOTE) The Xpert Xpress SARS-CoV-2/FLU/RSV plus assay is intended as an aid in the diagnosis of influenza from Nasopharyngeal swab specimens and should not be used as a sole basis for treatment. Nasal washings and aspirates are unacceptable for Xpert Xpress SARS-CoV-2/FLU/RSV testing.  Fact Sheet for Patients: EntrepreneurPulse.com.au  Fact Sheet for Healthcare Providers: IncredibleEmployment.be  This test is not yet approved or cleared by the [place] FDA and has been authorized for detection and/or diagnosis of SARS-CoV-2 by FDA under an Emergency Use Authorization (EUA). This EUA will remain in effect (meaning this test can be used) for the duration of the COVID-19 declaration under Section 564(b)(1) of the Act, 21 U.S.C. section 360bbb-3(b)(1), unless the authorization is terminated or revoked.  Performed at [facility], [address], [place], [place] [address]           Radiology Studies: CT Head Wo Contrast  Result Date: 07/31/2021 CLINICAL DATA:  Seizure EXAM: CT HEAD WITHOUT CONTRAST TECHNIQUE: Contiguous axial images were obtained from the base of the skull through the vertex without intravenous contrast. RADIATION DOSE REDUCTION: This exam was performed according to the departmental dose-optimization program which includes automated exposure control, adjustment of the mA and/or kV according to patient size and/or use of iterative reconstruction technique. COMPARISON:  CT head 02/03/2005 FINDINGS: Brain: No acute intracranial hemorrhage, mass effect, or herniation. No extra-axial fluid collections. No evidence of acute territorial infarct. No hydrocephalus. Vascular: No hyperdense vessel or unexpected calcification. Skull: Normal. Negative for fracture or focal lesion. Sinuses/Orbits: No acute finding. Other: None. IMPRESSION: Electronically Signed   By: Ofilia Neas M.D.   On: 07/31/2021 14:29   MR BRAIN W WO CONTRAST  Result Date: 07/31/2021 CLINICAL DATA:  Seizure new onset EXAM: MRI HEAD WITHOUT AND WITH CONTRAST TECHNIQUE: Multiplanar, multiecho pulse sequences of the brain and surrounding structures were obtained without and with intravenous contrast. CONTRAST:  63m GADAVIST GADOBUTROL 1 MMOL/ML IV SOLN COMPARISON:  CT head 07/31/2021.  MRI head 02/05/2005 FINDINGS: Brain: No acute infarction, hemorrhage, hydrocephalus, extra-axial collection or mass lesion. Normal white matter. Temporal lobe normal in signal and morphology bilaterally. Normal enhancement postcontrast administration. Vascular: Normal arterial flow voids. Skull and upper cervical spine: No aggressive skeletal lesion. Fat containing lesion in the left parietal bone most likely hemangioma. Sinuses/Orbits: Paranasal sinuses clear.  Negative orbit Other: None IMPRESSION: Normal MRI of the head with contrast. Electronically Signed   By: CFranchot GalloM.D.   On: 07/31/2021 18:41         Scheduled Meds:  chlorhexidine gluconate (MEDLINE KIT)  15 mL Mouth Rinse BID   enoxaparin (LOVENOX) injection  60 mg Subcutaneous Q24H   gabapentin  300 mg Oral TID   insulin aspart  0-15 Units Subcutaneous TID WC   insulin aspart  0-5 Units Subcutaneous QHS   mouth rinse  15 mL Mouth Rinse 10 times per day   Continuous Infusions:  sodium chloride 75 mL/hr at 08/01/21 0741     LOS: 0 days    Time spent:40 min***    Lyncoln Ledgerwood, CGeraldo Docker MD [facility]   If 7PM-7AM, please contact night-coverage 08/01/2021, 8:15 AM

## 2021-08-01 NOTE — Procedures (Signed)
History: 41 yo F being evaluated for new onset seizures  Sedation: none  Technique: This EEG was acquired with electrodes placed according to the International 10-20 electrode system (including Fp1, Fp2, F3, F4, C3, C4, P3, P4, O1, O2, T3, T4, T5, T6, A1, A2, Fz, Cz, Pz). The following electrodes were missing or displaced: none.   Background: The background consists of intermixed alpha and beta activities. There is a well defined posterior dominant rhythm of 9-10 Hz that attenuates with eye opening. Drowsiness is characterized by an increase in slow activity and anterior shifting of the PDR.   Photic stimulation: Physiologic driving is not performed  EEG Abnormalities: none  Clinical Interpretation: This normal EEG is recorded in the waking and drowsy state. There was no seizure or seizure predisposition recorded on this study. Please note that lack of epileptiform activity on EEG does not preclude the possibility of epilepsy.   Roland Rack, MD [facility] [phone]  If 7pm- 7am, please page neurology on call as listed in [place].

## 2021-08-01 NOTE — Progress Notes (Signed)
Inpatient Diabetes Program Recommendations  AACE/ADA: New Consensus Statement on Inpatient Glycemic Control (2015)  Target Ranges:  Prepandial:   less than 140 mg/dL      Peak postprandial:   less than 180 mg/dL (1-2 hours)      Critically ill patients:  140 - 180 mg/dL   Lab Results  Component Value Date   GLUCAP 189 (H) 08/01/2021   HGBA1C 7.5 (H) 07/31/2021    Review of Glycemic Control  Latest Reference Range & Units 07/31/21 13:29 07/31/21 21:20 08/01/21 07:38  Glucose-Capillary 70 - 99 mg/dL 232 (H) 256 (H) 189 (H)   Diabetes history: DM 2 Outpatient Diabetes medications: Jardiance 25 mg Daily, Rybelsus 14 mg Daily, Tresiba 20 units Current orders for Inpatient glycemic control:  Semglee 27 units Daily Novolog 0-15 units tid + hs Novolog 5 units tid meal coverage  A1c 7.5% on 2/14  Inpatient Diabetes Program Recommendations:    -   Consider starting 1/2 of pts home basal insulin, Semglee 10 units Q24 hours  Thanks,  Tama Headings RN, MSN, BC-ADM Inpatient Diabetes Coordinator Team Pager [phone] (8a-5p)

## 2021-08-01 NOTE — Discharge Summary (Signed)
Physician Discharge Summary  FLORIENE [name] MEB:[id number] DOB: 07-05-1980 DOA: 07/31/2021  PCP: [name], NP  Admit date: 07/31/2021 Discharge date: 08/01/2021  Time spent: 35 minutes  Recommendations for Outpatient Follow-up:   Generalized tonic-clonic seizure - No prior history.  Admitted to the hospital for further management.  CT of the head is negative.  -Per neurology gabapentin 300 mg TID -Patient is unable to drive, operate heavy machinery, perform activities at heights or participate in water activities until release by outpatient physician.  Per Neurology Dr. Roland Rack was discussed with the patient who expressed understanding.  -2/15 EEG; per neurology WNL patient cleared for discharge -2/15 have placed request for 2-week follow-up with [facility] neurology secondary to new onset seizures.  They should call patient to set up appointment   Mild renal insufficiency - Creatinine 1.21, unknown baseline.   Lab Results  Component Value Date   CREATININE 0.89 08/01/2021   CREATININE 1.21 (H) 07/31/2021   CREATININE 0.89 09/26/2020   CREATININE 0.79 01/01/2020   CREATININE 0.91 12/31/2016  -2/15 WNL   Diabetes mellitus type 2 uncontrolled with hyperglycemia -Patient to discuss titration of medications with PCP.   Essential hypertension -Restart home medication   History of bipolar 1/general anxiety disorder/Schizophrenia - Restart home medication    Discharge Diagnoses:  Principal Problem:   Seizure ([place]) Active Problems:   Morbid obesity ([place])   Depression with anxiety   Type 2 diabetes mellitus with hyperglycemia ([place])   HTN (hypertension)   Bipolar 1 disorder, mixed, severe ([place])   Discharge Condition: Stable  Diet recommendation: Diabetic  Filed Weights   07/31/21 1252  Weight: 129.3 kg    History of present illness:  Andrea [name] is a 41 y.o. WF PMHx bipolar disorder, schizophrenia, DM2, essential hypertension, GERD,  asthma    Comes to the hospital after having an episode of seizure.  Patient was in her usual state up until this morning when she went to work.  While at work she states she felt dizzy momentarily thereafter had a generalized tonic-clonic seizure which was witnessed by her coworkers lasting for about 4 minutes.  She was in a postictal state thereafter.  EMS was called and she was brought to the hospital.  By the time she arrived to the hospital her mentation was at baseline.  Husband was present as well.  Routine work-up including CT head was unremarkable.  Patient denies having similar episodes in the past.  No prior history of seizure. While in the ED she had another episode of seizure lasting for about 1 minute witnessed by her husband and it was mainly shaking of her upper extremities.  Patient was seen by neurology who recommended admitting the patient for routine work-up.  Hospital Course:  See above  Procedures -2/14 CT head W0 contrast: No acute finding - 2/14 MRI brain negative for acute finding  Consultations: Neurology Dr. Roland Rack    Discharge Exam: Vitals:   07/31/21 2118 08/01/21 0415 08/01/21 0816 08/01/21 1410  BP: 134/81 135/85 129/75 131/83  Pulse: 71 65 64 65  Resp: _0 Temp: 98.5 F (36.9 C) 97.8 F (36.6 C)  98.1 F (36.7 C)  TempSrc:  Oral  Oral  SpO2: 96% 95% 93% 96%  Weight:      Height:       Physical Exam:  General: No acute respiratory distress Eyes: negative scleral hemorrhage, negative anisocoria, negative icterus ENT: Negative Runny nose, negative gingival bleeding, Neck:  Negative scars, masses, torticollis, lymphadenopathy, JVD Lungs: Clear to auscultation bilaterally without wheezes or crackles Cardiovascular: Regular rate and rhythm without murmur gallop or rub normal S1 and S2 Abdomen: OBESE, negative abdominal pain, nondistended, positive soft, bowel sounds, no rebound, no ascites, no appreciable mass Extremities: No  significant cyanosis, clubbing, or edema bilateral lower extremities Skin: Negative rashes, lesions, ulcers Psychiatric:  Negative depression, negative anxiety, negative fatigue, negative mania  Central nervous system:  Cranial nerves II through XII intact, tongue/uvula midline, all extremities muscle strength 5/5, sensation intact throughout, finger nose finger bilateral within normal limits, quick finger touch bilateral within normal limits, negative Romberg sign, heel to shin bilateral within normal limits, standing on 1 foot bilateral within normal limits, walking on tiptoes within normal limits, walking on heels within normal limits, negative dysarthria, negative expressive aphasia, negative receptive aphasia.      Discharge Instructions  Discharge Instructions     Ambulatory referral to Neurology   Complete by: As directed    An appointment is requested in approximately: 2 weeks for hospital followup for new onset seizures      Allergies as of 08/01/2021       Reactions   Bactrim [sulfamethoxazole-trimethoprim] Hives, Itching   Hydrocodone-acetaminophen Itching   Vicodin [hydrocodone-acetaminophen] Hives, Itching        Medication List     STOP taking these medications    benzonatate 100 MG capsule Commonly known as: TESSALON   cephALEXin 500 MG capsule Commonly known as: KEFLEX   metroNIDAZOLE 500 MG tablet Commonly known as: FLAGYL   pseudoephedrine 60 MG tablet Commonly known as: SUDAFED       TAKE these medications    albuterol 108 (90 Base) MCG/ACT inhaler Commonly known as: VENTOLIN HFA Inhale 1-2 puffs into the lungs every 6 (six) hours as needed for wheezing or shortness of breath.   BinaxNOW COVID-19 Ag Home Test Kit Generic drug: COVID-19 At Home Antigen Test See admin instructions.   cetirizine 10 MG tablet Commonly known as: ZyrTEC Allergy Take 1 tablet (10 mg total) by mouth daily.   doxepin 50 MG capsule Commonly known as:  SINEQUAN Take 50 mg by mouth at bedtime.   FLUoxetine 20 MG capsule Commonly known as: PROzac Take 1 capsule (20 mg total) by mouth daily. What changed: how much to take   gabapentin 300 MG capsule Commonly known as: NEURONTIN Take 1 capsule (300 mg total) by mouth 3 (three) times daily. What changed: when to take this   ibuprofen 800 MG tablet Commonly known as: ADVIL Take 1 tablet (800 mg total) by mouth every 6 (six) hours as needed for moderate pain.   Jardiance 10 MG Tabs tablet Generic drug: empagliflozin Take 25 mg by mouth daily.   lisinopril-hydrochlorothiazide 20-25 MG tablet Commonly known as: ZESTORETIC Take 1 tablet by mouth daily.   metoprolol succinate 25 MG 24 hr tablet Commonly known as: TOPROL-XL Take 1 tablet by mouth daily.   OLANZapine 10 MG tablet Commonly known as: ZYPREXA Take 10 mg by mouth in the morning and at bedtime.   oxybutynin 5 MG 24 hr tablet Commonly known as: DITROPAN-XL Take one tablet twice daily   pantoprazole 40 MG tablet Commonly known as: PROTONIX Take 1 tablet by mouth daily.   promethazine-dextromethorphan 6.25-15 MG/5ML syrup Commonly known as: PROMETHAZINE-DM Take 5 mLs by mouth at bedtime as needed for cough.   Rybelsus 7 MG Tabs Generic drug: Semaglutide Take 14 mg by mouth daily.   tiZANidine 4 MG  tablet Commonly known as: ZANAFLEX Take 4 mg by mouth every 8 (eight) hours as needed for muscle spasms.   [name] FlexTouch 100 UNIT/ML FlexTouch Pen Generic drug: insulin degludec Inject 20 Units into the skin daily.       Allergies  Allergen Reactions   Bactrim [Sulfamethoxazole-Trimethoprim] Hives and Itching   Hydrocodone-Acetaminophen Itching   Vicodin [Hydrocodone-Acetaminophen] Hives and Itching      The results of significant diagnostics from this hospitalization (including imaging, microbiology, ancillary and laboratory) are listed below for reference.    Significant Diagnostic Studies: CT Head  Wo Contrast  Result Date: 07/31/2021 CLINICAL DATA:  Seizure EXAM: CT HEAD WITHOUT CONTRAST TECHNIQUE: Contiguous axial images were obtained from the base of the skull through the vertex without intravenous contrast. RADIATION DOSE REDUCTION: This exam was performed according to the departmental dose-optimization program which includes automated exposure control, adjustment of the mA and/or kV according to patient size and/or use of iterative reconstruction technique. COMPARISON:  CT head 02/03/2005 FINDINGS: Brain: No acute intracranial hemorrhage, mass effect, or herniation. No extra-axial fluid collections. No evidence of acute territorial infarct. No hydrocephalus. Vascular: No hyperdense vessel or unexpected calcification. Skull: Normal. Negative for fracture or focal lesion. Sinuses/Orbits: No acute finding. Other: None. IMPRESSION: Electronically Signed   By: Ofilia Neas M.D.   On: 07/31/2021 14:29   MR BRAIN W WO CONTRAST  Result Date: 07/31/2021 CLINICAL DATA:  Seizure new onset EXAM: MRI HEAD WITHOUT AND WITH CONTRAST TECHNIQUE: Multiplanar, multiecho pulse sequences of the brain and surrounding structures were obtained without and with intravenous contrast. CONTRAST:  50m GADAVIST GADOBUTROL 1 MMOL/ML IV SOLN COMPARISON:  CT head 07/31/2021.  MRI head 02/05/2005 FINDINGS: Brain: No acute infarction, hemorrhage, hydrocephalus, extra-axial collection or mass lesion. Normal white matter. Temporal lobe normal in signal and morphology bilaterally. Normal enhancement postcontrast administration. Vascular: Normal arterial flow voids. Skull and upper cervical spine: No aggressive skeletal lesion. Fat containing lesion in the left parietal bone most likely hemangioma. Sinuses/Orbits: Paranasal sinuses clear.  Negative orbit Other: None IMPRESSION: Normal MRI of the head with contrast. Electronically Signed   By: CFranchot GalloM.D.   On: 07/31/2021 18:41   EEG adult  Result Date:  08/01/2021 KGreta Doom MD     08/01/2021  4:35 PM History: 41yo F being evaluated for new onset seizures Sedation: none Technique: This EEG was acquired with electrodes placed according to the International 10-20 electrode system (including Fp1, Fp2, F3, F4, C3, C4, P3, P4, O1, O2, T3, T4, T5, T6, A1, A2, Fz, Cz, Pz). The following electrodes were missing or displaced: none. Background: The background consists of intermixed alpha and beta activities. There is a well defined posterior dominant rhythm of 9-10 Hz that attenuates with eye opening. Drowsiness is characterized by an increase in slow activity and anterior shifting of the PDR. Photic stimulation: Physiologic driving is not performed EEG Abnormalities: none Clinical Interpretation: This normal EEG is recorded in the waking and drowsy state. There was no seizure or seizure predisposition recorded on this study. Please note that lack of epileptiform activity on EEG does not preclude the possibility of epilepsy. MRoland Rack MD [facility] [phone] 7pm- 7am, please page neurology on call [place]    Microbiology: Recent Results (from the past 240 hour(s))  Resp Panel by RT-PCR (Flu A&B, Covid) Nasopharyngeal Swab     Status: None   Collection Time: 07/31/21  4:44 PM   Specimen: Nasopharyngeal Swab; Nasopharyngeal(NP) swabs in  vial transport medium  Result Value Ref Range Status   SARS Coronavirus 2 by RT PCR NEGATIVE NEGATIVE Final    Comment: (NOTE) SARS-CoV-2 target nucleic acids are NOT DETECTED.  The SARS-CoV-2 RNA is generally detectable in upper respiratory specimens during the acute phase of infection. The lowest concentration of SARS-CoV-2 viral copies this assay can detect is 138 copies/mL. A negative result does not preclude SARS-Cov-2 infection and should not be used as the sole basis for treatment or other patient management decisions. A negative result may occur with  improper  specimen collection/handling, submission of specimen other than nasopharyngeal swab, presence of viral mutation(s) within the areas targeted by this assay, and inadequate number of viral copies(<138 copies/mL). A negative result must be combined with clinical observations, patient history, and epidemiological information. The expected result is Negative.  Fact Sheet for Patients:  EntrepreneurPulse.com.au  Fact Sheet for Healthcare Providers:  IncredibleEmployment.be  This test is no t yet approved or cleared by the [place] FDA and  has been authorized for detection and/or diagnosis of SARS-CoV-2 by FDA under an Emergency Use Authorization (EUA). This EUA will remain  in effect (meaning this test can be used) for the duration of the COVID-19 declaration under Section 564(b)(1) of the Act, 21 U.S.C.section 360bbb-3(b)(1), unless the authorization is terminated  or revoked sooner.       Influenza A by PCR NEGATIVE NEGATIVE Final   Influenza B by PCR NEGATIVE NEGATIVE Final    Comment: (NOTE) The Xpert Xpress SARS-CoV-2/FLU/RSV plus assay is intended as an aid in the diagnosis of influenza from Nasopharyngeal swab specimens and should not be used as a sole basis for treatment. Nasal washings and aspirates are unacceptable for Xpert Xpress SARS-CoV-2/FLU/RSV testing.  Fact Sheet for Patients: EntrepreneurPulse.com.au  Fact Sheet for Healthcare Providers: IncredibleEmployment.be  This test is not yet approved or cleared by the [place] FDA and has been authorized for detection and/or diagnosis of SARS-CoV-2 by FDA under an Emergency Use Authorization (EUA). This EUA will remain in effect (meaning this test can be used) for the duration of the COVID-19 declaration under Section 564(b)(1) of the Act, 21 U.S.C. section 360bbb-3(b)(1), unless the authorization is terminated or revoked.  Performed at  [facility], [address] [place],  [address]      Labs: Basic Metabolic Panel: Recent Labs  Lab 07/31/21 1308 08/01/21 0507  NA 136 139  K 3.8 3.8  CL 99 101  CO2 23 23  GLUCOSE 265* 201*  BUN 22* 20  CREATININE 1.21* 0.89  CALCIUM 9.5 9.4  MG  --  1.9   Liver Function Tests: No results for input(s): AST, ALT, ALKPHOS, BILITOT, PROT, ALBUMIN in the last 168 hours. No results for input(s): LIPASE, AMYLASE in the last 168 hours. No results for input(s): AMMONIA in the last 168 hours. CBC: Recent Labs  Lab 07/31/21 1308 08/01/21 0507  WBC 6.6 6.2  HGB 13.0 12.0  HCT 41.9 37.9  MCV 97.0 99.0  PLT 185 171   Cardiac Enzymes: Recent Labs  Lab 07/31/21 1301  CKTOTAL 79   BNP: BNP (last 3 results) No results for input(s): BNP in the last 8760 hours.  ProBNP (last 3 results) No results for input(s): PROBNP in the last 8760 hours.  CBG: Recent Labs  Lab 07/31/21 1329 07/31/21 2120 08/01/21 0738 08/01/21 1152 08/01/21 1642  GLUCAP 232* 256* 189* 219* 193*       Signed:  Dia Crawford, MD [facility]

## 2021-08-01 NOTE — Progress Notes (Signed)
EEG and MRI are normal. Etiology is unclear at this time. Though I Would not always start AED therapy in this situation, since she was already on a subtherapeutic dose of gabapentin, I have increased this to 300mg  TID which is an anti-epileptic dose.   If she has further seizures, could increase this or change to another agent. Please call with questions or concerns.  Roland Rack, MD [facility] [phone]  If 7pm- 7am, please page neurology on call as listed in [place].

## 2021-08-08 ENCOUNTER — Ambulatory Visit (INDEPENDENT_AMBULATORY_CARE_PROVIDER_SITE_OTHER): Payer: No Typology Code available for payment source | Admitting: Neurology

## 2021-08-08 ENCOUNTER — Other Ambulatory Visit: Payer: Self-pay

## 2021-08-08 ENCOUNTER — Encounter: Payer: Self-pay | Admitting: Neurology

## 2021-08-08 VITALS — BP 119/82 | HR 83 | Resp 18 | Ht 71.0 in | Wt 312.0 lb

## 2021-08-08 DIAGNOSIS — R569 Unspecified convulsions: Secondary | ICD-10-CM

## 2021-08-08 MED ORDER — ZONISAMIDE 100 MG PO CAPS: ORAL_CAPSULE | ORAL | 6 refills | Status: DC

## 2021-08-08 NOTE — Patient Instructions (Signed)
Good to meet you.  Start Zonisamide 100mg : Take 1 capsule every night for 2 weeks, then increase to 2 capsules every night for 2 weeks, then increase to 3 capsules every night and continue  2. Schedule 48-hour EEG  3. If able, try to check your glucose levels when you have a seizure  4. Follow-up in 3 months, call for any changes   Seizure Precautions: 1. If medication has been prescribed for you to prevent seizures, take it exactly as directed.  Do not stop taking the medicine without talking to your doctor first, even if you have not had a seizure in a long time.   2. Avoid activities in which a seizure would cause danger to yourself or to others.  Don't operate dangerous machinery, swim alone, or climb in high or dangerous places, such as on ladders, roofs, or girders.  Do not drive unless your doctor says you may.  3. If you have any warning that you may have a seizure, lay down in a safe place where you can't hurt yourself.    4.  No driving for 6 months from last seizure, as per [facility].   Please refer to the following link on the [place] website for more information: http://www.epilepsyfoundation.org/answerplace/Social/driving/drivingu.cfm   5.  Maintain good sleep hygiene.   6.  Notify your neurology if you are planning pregnancy or if you become pregnant.  7.  Contact your doctor if you have any problems that may be related to the medicine you are taking.  8.  Call 911 and bring the patient back to the ED if:        A.  The seizure lasts longer than 5 minutes.       B.  The patient doesn't awaken shortly after the seizure  C.  The patient has new problems such as difficulty seeing, speaking or moving  D.  The patient was injured during the seizure  E.  The patient has a temperature over 102 F (39C)  F.  The patient vomited and now is having trouble breathing

## 2021-08-08 NOTE — Progress Notes (Signed)
NEUROLOGY CONSULTATION NOTE  [name] MRN: [id number] DOB: Aug 31, 1980  Referring provider: Dr. Dia Crawford Primary care provider: [name], NP  Reason for consult:  new onset seizure  Dear Dr [name]:  Thank you for your kind referral of Andrea [name] for consultation of the above symptoms. Although her history is well known to you, please allow me to reiterate it for the purpose of our medical record. The patient was accompanied to the clinic by her husband [name] who also provides collateral information. Records and images were personally reviewed where available.   HISTORY OF PRESENT ILLNESS: This is a pleasant 41 year old right-handed woman with a history of hypertension, DM, migraines, bipolar disorder, presenting for evaluation of new onset seizures. She is accompanied by her husband who helps supplement the history today. She was in her usual state of health until 07/31/21 while at work at [employer], she started feeling a little funny and sat down, then woke up in the hospital. Co-workers reported she got went, went "pale white," then slid down to the floor and had a 4-minute episode of jerking with EMS noting post-ictal confusion. No tongue bite or incontinence. In the ER, her husband reports she had 2 more seizures, but ER notes indicate there was an episode of upper body jerking including head jerking lasting 15-20 seconds. Bloodwork showed creatinine 1.21, glucose 265. I personally reviewed MRI brain with and without contrast which was normal, hippocampi symmetric with no abnormal signal or enhancement seen. Her wake and drowsy EEG was normal. She had been on Gabapentin for anxiety, dose was increased to 329m TID. She feels her left side has been weaker since then, this has gotten better. Since hospital discharge, her husband reports more seizures. She had an unwitnessed one on 2/16 where she recalls going to the bathroom and coming out, then waking up on the ground, no  injuries/tongue bite/incontinence. There was no prior warning. On 2/17, after eating a snack, she told her husband she felt funny and lay down, she then had another shaking episode lasting less than a minute. The last episode was on 2/18, they were dozing in bed, he felt her shaking and saw her lying on her left side. He thinks her eyes were closed, mouth open, it lasted less than 30 seconds, she woke up, then had another 30-second shaking episode. She had no memory of it and told her husband her head hurt and she felt very tired. Her husband denies any staring episodes. She denies any olfactory/gustatory hallucinations, deja vu, rising epigastric sensation, focal numbness/tingling. She has occasional jerks in her legs. She used to have migraines that quieted down. She used to take Topamax but it interacted with one of her medications. Since the initial seizure, she has had frontal throbbing headaches that recur, different from past headaches. She is sensitive to lights/sounds, no nausea/vomiting. Tylenol helps sometimes but lately has not, she has been taking 2 6585mtabs three times a day for the past week. She denies any diplopia, dysarthria/dysphagia, neck/back pain, bowel/bladder dysfunction. Her husband tried to check her glucose level with the last seizure, it was 271. She denies any sleep deprivation, she gets 6-8 hours of sleep with Doxepin. No recent change in stress levels. She is on Zyprexa for mood, mood is good. She is not having a lot of anxiety anymore. 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 She had a normal birth and  early development.  There is no history of febrile convulsions, CNS infections such as meningitis/encephalitis, significant traumatic brain injury, neurosurgical procedures, or family history of seizures.   PAST MEDICAL HISTORY: Past Medical History:  Diagnosis Date   Anxiety    Bipolar 1 disorder ([place])    Complication of  anesthesia    hard time waking up    Depression    Diabetes mellitus without complication (HCC)    GERD (gastroesophageal reflux disease)    no meds   Kidney stones    Low back pain    Migraines    PCOS (polycystic ovarian syndrome)    PONV (postoperative nausea and vomiting)    Schizophrenia ([place])    Shortness of breath dyspnea    with bronchitis    PAST SURGICAL HISTORY: Past Surgical History:  Procedure Laterality Date   ABDOMINAL HYSTERECTOMY N/A 11/14/2015   Procedure: HYSTERECTOMY ABDOMINAL;  Surgeon: Olga Millers, MD;  Location: [facility];  Service: Gynecology;  Laterality: N/A;   CHOLECYSTECTOMY     INTRAUTERINE DEVICE (IUD) INSERTION  06/14/2014   [facility]   LYMPH NODES REMOVED     ovarian cyst removed     SALPINGOOPHORECTOMY Bilateral 11/14/2015   Procedure: SALPINGO OOPHORECTOMY;  Surgeon: Olga Millers, MD;  Location: [facility];  Service: Gynecology;  Laterality: Bilateral;    MEDICATIONS: Current Outpatient Medications on File Prior to Visit  Medication Sig Dispense Refill   albuterol (VENTOLIN HFA) 108 (90 Base) MCG/ACT inhaler Inhale 1-2 puffs into the lungs every 6 (six) hours as needed for wheezing or shortness of breath. 18 g 0   cetirizine (ZYRTEC ALLERGY) 10 MG tablet Take 1 tablet (10 mg total) by mouth daily. 30 tablet 0   doxepin (SINEQUAN) 50 MG capsule Take 50 mg by mouth at bedtime.     FLUoxetine (PROZAC) 20 MG capsule Take 1 capsule (20 mg total) by mouth daily. (Patient taking differently: Take 60 mg by mouth daily.) 90 capsule 0   gabapentin (NEURONTIN) 300 MG capsule Take 1 capsule (300 mg total) by mouth 3 (three) times daily. 90 capsule 0   ibuprofen (ADVIL) 800 MG tablet Take 1 tablet (800 mg total) by mouth every 6 (six) hours as needed for moderate pain. 20 tablet 0   JARDIANCE 10 MG TABS tablet Take 25 mg by mouth daily.     lisinopril-hydrochlorothiazide (ZESTORETIC) 20-25 MG tablet Take 1 tablet by mouth daily.     metoprolol  succinate (TOPROL-XL) 25 MG 24 hr tablet Take 1 tablet by mouth daily.     OLANZapine (ZYPREXA) 10 MG tablet Take 10 mg by mouth in the morning and at bedtime.     oxybutynin (DITROPAN-XL) 5 MG 24 hr tablet Take one tablet twice daily 60 tablet 2   pantoprazole (PROTONIX) 40 MG tablet Take 1 tablet by mouth daily.     RYBELSUS 7 MG TABS Take 14 mg by mouth daily.     tiZANidine (ZANAFLEX) 4 MG tablet Take 4 mg by mouth every 8 (eight) hours as needed for muscle spasms.     TRESIBA FLEXTOUCH 100 UNIT/ML FlexTouch Pen Inject 20 Units into the skin daily.     BINAXNOW COVID-19 AG HOME TEST KIT See admin instructions. (Patient not taking: Reported on 08/08/2021)     promethazine-dextromethorphan (PROMETHAZINE-DM) 6.25-15 MG/5ML syrup Take 5 mLs by mouth at bedtime as needed for cough. (Patient not taking: Reported on 07/31/2021) 100 mL 0   [DISCONTINUED] fluticasone (FLOVENT HFA) 110 MCG/ACT inhaler  Inhale 2 puffs into the lungs 2 (two) times daily. For shortness of breath (Patient not taking: Reported on 09/26/2020) 3 Inhaler 0   [DISCONTINUED] metFORMIN (GLUCOPHAGE) 500 MG tablet Take 1 tablet (500 mg total) by mouth 2 (two) times daily with a meal. (Patient not taking: No sig reported) 60 tablet 1   [DISCONTINUED] trazodone (DESYREL) 300 MG tablet Take 300 mg by mouth at bedtime. (Patient not taking: Reported on 09/26/2020)     No current facility-administered medications on file prior to visit.    ALLERGIES: Allergies  Allergen Reactions   Bactrim [Sulfamethoxazole-Trimethoprim] Hives and Itching   Hydrocodone-Acetaminophen Itching   Vicodin [Hydrocodone-Acetaminophen] Hives and Itching    FAMILY HISTORY: Family History  Problem Relation Age of Onset   Healthy Mother    Healthy Father     SOCIAL HISTORY: Social History   Socioeconomic History   Marital status: Divorced    Spouse name: Not on file   Number of children: 0   Years of education: 12   Highest education level: Not on  file  Occupational History   Not on file  Tobacco Use   Smoking status: Never   Smokeless tobacco: Never  Vaping Use   Vaping Use: Never used  Substance and Sexual Activity   Alcohol use: No   Drug use: No   Sexual activity: Never  Other Topics Concern   Not on file  Social History Narrative   Right handed   Drinks caffeine   One story home   Social Determinants of Health   Financial Resource Strain: Not on file  Food Insecurity: Not on file  Transportation Needs: Not on file  Physical Activity: Not on file  Stress: Not on file  Social Connections: Not on file  Intimate Partner Violence: Not on file     PHYSICAL EXAM: Vitals:   08/08/21 0957  BP: 119/82  Pulse: 83  Resp: 18  SpO2: 97%   General: No acute distress Head:  Normocephalic/atraumatic Skin/Extremities: No rash, no edema Neurological Exam: Mental status: alert and oriented to person, place, and time, no dysarthria or aphasia, Fund of knowledge is appropriate.  Recent and remote memory are intact, 3/3 delayed recall.  Attention and concentration are normal, 5/5 WORLD backwards.  Cranial nerves: CN I: not tested CN II: pupils equal, round and reactive to light, visual fields intact CN III, IV, VI:  full range of motion, no nystagmus, no ptosis CN V: facial sensation intact CN VII: upper and lower face symmetric CN VIII: hearing intact to conversation Bulk & Tone: normal, no fasciculations. Motor: 5/5 throughout with drift on left arm, no orbiting, good finger taps Sensation: intact to light touch, cold, pin, vibration sense.  No extinction to double simultaneous stimulation.  Romberg test negative Deep Tendon Reflexes: +1 throughout Cerebellar: no incoordination on finger to nose testing Gait: narrow-based and steady, able to tandem walk adequately. Tremor: none   IMPRESSION: This is a pleasant 40 year old right-handed woman with a history of hypertension, DM, migraines, bipolar disorder, presenting  for evaluation of new onset seizures. Since the initial convulsion on 07/31/21, she has had 3 more seizures, last seizure occurred on 08/04/2021. Etiology of seizures unclear, MRI brain with and without contrast and routine EEG normal. We discussed starting Zonisamide for seizure and headache prophylaxis. Side effects discussed, start Zonisamide 142m qhs x 2 weeks, then increase to 2013mqhs x 2 weeks, then increase to 30043mhs. Continue Gabapentin 300m72mD. We discussed doing a 48-hour  EEG to further characterize her seizures. [place] driving laws were discussed with the patient, and she knows to stop driving after a seizure, until 6 months seizure-free. Follow-up in 3 months, call for any changes.    Thank you for allowing me to participate in the care of this patient. Please do not hesitate to call for any questions or concerns.   Ellouise Newer, M.D.  CC: Dr. [name], [name], NP

## 2021-08-18 ENCOUNTER — Ambulatory Visit (INDEPENDENT_AMBULATORY_CARE_PROVIDER_SITE_OTHER): Payer: No Typology Code available for payment source

## 2021-08-18 ENCOUNTER — Other Ambulatory Visit: Payer: Self-pay

## 2021-08-18 ENCOUNTER — Ambulatory Visit
Admission: EM | Admit: 2021-08-18 | Discharge: 2021-08-18 | Disposition: A | Discharge status: Home / Self Care | Payer: No Typology Code available for payment source | Attending: Family Medicine | Admitting: Family Medicine

## 2021-08-18 DIAGNOSIS — R0782 Intercostal pain: Secondary | ICD-10-CM | POA: Diagnosis not present

## 2021-08-18 DIAGNOSIS — S299XXA Unspecified injury of thorax, initial encounter: Secondary | ICD-10-CM

## 2021-08-18 DIAGNOSIS — W19XXXA Unspecified fall, initial encounter: Secondary | ICD-10-CM | POA: Diagnosis not present

## 2021-08-18 NOTE — ED Provider Notes (Signed)
[facility]    CSN: [id number] Arrival date & time: 08/18/21  0944      History   Chief Complaint Chief Complaint  Patient presents with   Fall    HPI Andrea [name] is a 41 y.o. female.   41 year old female presents today with left rib pain and bruising.  This occurred from a fall in the bathtub 4 days ago.  Swelling to the area and bruising.  Pain with taking a deep breath.   Fall   Past Medical History:  Diagnosis Date   Anxiety    Bipolar 1 disorder ([place])    Complication of anesthesia    hard time waking up    Depression    Diabetes mellitus without complication ([place])    GERD (gastroesophageal reflux disease)    no meds   Kidney stones    Low back pain    Migraines    PCOS (polycystic ovarian syndrome)    PONV (postoperative nausea and vomiting)    Schizophrenia ([place])    Shortness of breath dyspnea    with bronchitis    Patient Active Problem List   Diagnosis Date Noted   Seizure ([place]) 07/31/2021   Irregular periods 09/26/2020   Mild persistent asthma with acute exacerbation 06/18/2017   Bipolar I disorder, most recent episode depressed ([place]) 12/20/2016   Bipolar 1 disorder, mixed, severe ([place]) 12/20/2016   Type 2 diabetes mellitus with hyperglycemia ([place]) 09/18/2016   HTN (hypertension) 09/18/2016   Seizures ([place]) 06/04/2016   Pelvic pain in female 11/14/2015   Acute pyelonephritis 01/16/2013   Migraines 10/30/2012   Morbid obesity ([place]) 10/30/2012   Depression with anxiety 10/30/2012   Kidney stones     Past Surgical History:  Procedure Laterality Date   ABDOMINAL HYSTERECTOMY N/A 11/14/2015   Procedure: HYSTERECTOMY ABDOMINAL;  Surgeon: Olga Millers, MD;  Location: [facility];  Service: Gynecology;  Laterality: N/A;   CHOLECYSTECTOMY     INTRAUTERINE DEVICE (IUD) INSERTION  06/14/2014   [facility]   LYMPH NODES REMOVED     ovarian cyst removed     SALPINGOOPHORECTOMY Bilateral 11/14/2015   Procedure: SALPINGO  OOPHORECTOMY;  Surgeon: Olga Millers, MD;  Location: [facility];  Service: Gynecology;  Laterality: Bilateral;    OB History   No obstetric history on file.      Home Medications    Prior to Admission medications   Medication Sig Start Date End Date Taking? Authorizing Provider  albuterol (VENTOLIN HFA) 108 (90 Base) MCG/ACT inhaler Inhale 1-2 puffs into the lungs every 6 (six) hours as needed for wheezing or shortness of breath. 04/15/21   Jaynee Eagles, PA-C  BINAXNOW COVID-19 AG HOME TEST KIT See admin instructions. Patient not taking: Reported on 08/08/2021 08/31/20   [provider]  cetirizine (ZYRTEC ALLERGY) 10 MG tablet Take 1 tablet (10 mg total) by mouth daily. 04/15/21   Jaynee Eagles, PA-C  doxepin (SINEQUAN) 50 MG capsule Take 50 mg by mouth at bedtime. 06/04/21   [provider]  FLUoxetine (PROZAC) 20 MG capsule Take 1 capsule (20 mg total) by mouth daily. Patient taking differently: Take 60 mg by mouth daily. 08/14/17   Kathyrn Drown, MD  gabapentin (NEURONTIN) 300 MG capsule Take 1 capsule (300 mg total) by mouth 3 (three) times daily. 08/01/21   Allie Bossier, MD  ibuprofen (ADVIL) 800 MG tablet Take 1 tablet (800 mg total) by mouth every 6 (six) hours as needed for moderate pain.  02/02/20   Orpah Greek, MD  JARDIANCE 10 MG TABS tablet Take 25 mg by mouth daily. 09/11/20   [provider]  lisinopril-hydrochlorothiazide (ZESTORETIC) 20-25 MG tablet Take 1 tablet by mouth daily.    [provider]  metoprolol succinate (TOPROL-XL) 25 MG 24 hr tablet Take 1 tablet by mouth daily. 08/17/20   [provider]  OLANZapine (ZYPREXA) 10 MG tablet Take 10 mg by mouth in the morning and at bedtime.    [provider]  oxybutynin (DITROPAN-XL) 5 MG 24 hr tablet Take one tablet twice daily 09/04/17   Luking, Elayne Snare, MD  pantoprazole (PROTONIX) 40 MG tablet Take 1 tablet by mouth daily. 08/31/20   [provider]   promethazine-dextromethorphan (PROMETHAZINE-DM) 6.25-15 MG/5ML syrup Take 5 mLs by mouth at bedtime as needed for cough. Patient not taking: Reported on 07/31/2021 04/15/21   Jaynee Eagles, PA-C  RYBELSUS 7 MG TABS Take 14 mg by mouth daily. 09/17/20   [provider]  tiZANidine (ZANAFLEX) 4 MG tablet Take 4 mg by mouth every 8 (eight) hours as needed for muscle spasms.    [provider]  TRESIBA FLEXTOUCH 100 UNIT/ML FlexTouch Pen Inject 20 Units into the skin daily. 04/04/21   [provider]  zonisamide (ZONEGRAN) 100 MG capsule Take 1 capsule every night for 2 weeks, then increase to 2 capsules every night for 2 weeks, then increase to 3 capsules every night and continue 08/08/21   Cameron Sprang, MD  fluticasone (FLOVENT HFA) 110 MCG/ACT inhaler Inhale 2 puffs into the lungs 2 (two) times daily. For shortness of breath Patient not taking: Reported on 09/26/2020 12/24/16 10/27/20  Lindell Spar I, NP  metFORMIN (GLUCOPHAGE) 500 MG tablet Take 1 tablet (500 mg total) by mouth 2 (two) times daily with a meal. Patient not taking: No sig reported 01/01/20 10/27/20  Noemi Chapel, MD  trazodone (DESYREL) 300 MG tablet Take 300 mg by mouth at bedtime. Patient not taking: Reported on 09/26/2020  10/27/20  [provider]    Family History Family History  Problem Relation Age of Onset   Healthy Mother    Healthy Father     Social History Social History   Tobacco Use   Smoking status: Never   Smokeless tobacco: Never  Vaping Use   Vaping Use: Never used  Substance Use Topics   Alcohol use: No   Drug use: No     Allergies   Bactrim [sulfamethoxazole-trimethoprim], Hydrocodone-acetaminophen, and Vicodin [hydrocodone-acetaminophen]   Review of Systems Review of Systems Per HPI  Physical Exam Triage Vital Signs ED Triage Vitals  Enc Vitals Group     BP 08/18/21 1011 103/72     Pulse Rate 08/18/21 1011 77     Resp 08/18/21 1011 18     Temp 08/18/21  1011 97.7 F (36.5 C)     Temp Source 08/18/21 1011 Oral     SpO2 08/18/21 1011 95 %     Weight --      Height --      Head Circumference --      Peak Flow --      Pain Score 08/18/21 1013 8     Pain Loc --      Pain Edu? --      Excl. in [place]? --    No data found.  Updated Vital Signs BP 103/72 (BP Location: Right Arm)    Pulse 77    Temp 97.7 F (36.5 C) (  Oral)    Resp 18    LMP  (LMP Unknown)    SpO2 95%   Visual Acuity Right Eye Distance:   Left Eye Distance:   Bilateral Distance:    Right Eye Near:   Left Eye Near:    Bilateral Near:     Physical Exam Vitals and nursing note reviewed.  Constitutional:      General: She is not in acute distress.    Appearance: Normal appearance. She is not ill-appearing, toxic-appearing or diaphoretic.  Cardiovascular:     Rate and Rhythm: Normal rate and regular rhythm.  Pulmonary:     Effort: Pulmonary effort is normal.     Breath sounds: Normal breath sounds.  Musculoskeletal:     Comments: Large dark bruise to left rib area with tenderness to palpation.   Neurological:     Mental Status: She is alert.     UC Treatments / Results  Labs (all labs ordered are listed, but only abnormal results are displayed) Labs Reviewed - No data to display  EKG   Radiology DG Ribs Unilateral W/Chest Left  Result Date: 08/18/2021 CLINICAL DATA:  41 year old female status post fall 4 days ago onto left side. EXAM: LEFT RIBS AND CHEST - 3+ VIEW COMPARISON:  CT Abdomen and Pelvis 04/24/2016. Chest radiographs 04/30/2012. FINDINGS: Mildly lower lung volumes. Stable mild elevation of the right hemidiaphragm, normal variant. Normal cardiac size and mediastinal contours. Visualized tracheal air column is within normal limits. Lungs appear stable and clear. No pneumothorax or pleural effusion. Dextroconvex thoracic scoliosis appears increased since 2013. Three oblique views of the left ribs. Bone mineralization is within normal limits. No left rib  fracture identified. Other visible osseous structures appear intact. Cholecystectomy clips in the right upper quadrant. Negative visible bowel gas. IMPRESSION: 1. No left rib fracture identified. Increased dextroconvex thoracic scoliosis since 2013. 2. No acute cardiopulmonary abnormality. Electronically Signed   By: Genevie Ann M.D.   On: 08/18/2021 10:52    Procedures Procedures (including critical care time)  Medications Ordered in UC Medications - No data to display  Initial Impression / Assessment and Plan / UC Course  I have reviewed the triage vital signs and the nursing notes.  Pertinent labs & imaging results that were available during my care of the patient were reviewed by me and considered in my medical decision making (see chart for details).     Rib injury.  Most likely bad bruise.  X-ray without any acute fractures.  Recommended Tylenol and ibuprofen for pain as needed.  Ice to the area. Ensure she is taking deep breaths to avoid getting pneumonia. If symptoms worsen she will need to follow-up with her primary care or go to the ER.  Final Clinical Impressions(s) / UC Diagnoses   Final diagnoses:  Rib injury     Discharge Instructions      Your x-ray did not show any fractures.  Most likely bad bruise You can do ice to the area Tylenol and ibuprofen for pain as needed. If any symptoms worsen to include worsening pain, swelling, blood in the urine, or shortness or breath you will need to be seen by your primary care or go to the ER.    ED Prescriptions   None    PDMP not reviewed this encounter.   Loura Halt A, NP 08/18/21 1106

## 2021-08-18 NOTE — Discharge Instructions (Signed)
Your x-ray did not show any fractures.  Most likely bad bruise ?You can do ice to the area ?Tylenol and ibuprofen for pain as needed. ?If any symptoms worsen to include worsening pain, swelling, blood in the urine, or shortness or breath you will need to be seen by your primary care or go to the ER. ?

## 2021-08-18 NOTE — ED Triage Notes (Signed)
Pt reports left side drib cage pain, after se fell on the bathtub 4 Das ago. States the area is swelling and bruise.  ?

## 2021-09-05 ENCOUNTER — Ambulatory Visit
Admission: EM | Admit: 2021-09-05 | Discharge: 2021-09-05 | Disposition: A | Discharge status: Home / Self Care | Payer: No Typology Code available for payment source | Attending: Urgent Care | Admitting: Urgent Care

## 2021-09-05 ENCOUNTER — Encounter: Payer: Self-pay | Admitting: Emergency Medicine

## 2021-09-05 ENCOUNTER — Other Ambulatory Visit: Payer: Self-pay

## 2021-09-05 DIAGNOSIS — J309 Allergic rhinitis, unspecified: Secondary | ICD-10-CM | POA: Diagnosis not present

## 2021-09-05 DIAGNOSIS — B349 Viral infection, unspecified: Secondary | ICD-10-CM | POA: Insufficient documentation

## 2021-09-05 DIAGNOSIS — R070 Pain in throat: Secondary | ICD-10-CM | POA: Diagnosis present

## 2021-09-05 DIAGNOSIS — R052 Subacute cough: Secondary | ICD-10-CM | POA: Insufficient documentation

## 2021-09-05 DIAGNOSIS — R1111 Vomiting without nausea: Secondary | ICD-10-CM | POA: Diagnosis present

## 2021-09-05 DIAGNOSIS — H9203 Otalgia, bilateral: Secondary | ICD-10-CM | POA: Insufficient documentation

## 2021-09-05 DIAGNOSIS — J4540 Moderate persistent asthma, uncomplicated: Secondary | ICD-10-CM | POA: Insufficient documentation

## 2021-09-05 LAB — POCT RAPID STREP A (OFFICE): Rapid Strep A Screen: NEGATIVE

## 2021-09-05 MED ORDER — BENZONATATE 100 MG PO CAPS: 100.0000 mg | ORAL_CAPSULE | Freq: Three times a day (TID) | ORAL | 0 refills | Status: DC | PRN

## 2021-09-05 MED ORDER — PROMETHAZINE-DM 6.25-15 MG/5ML PO SYRP: 5.0000 mL | ORAL_SOLUTION | Freq: Every evening | ORAL | 0 refills | Status: DC | PRN

## 2021-09-05 MED ORDER — ONDANSETRON 8 MG PO TBDP: 8.0000 mg | ORAL_TABLET | Freq: Three times a day (TID) | ORAL | 0 refills | Status: DC | PRN

## 2021-09-05 NOTE — ED Provider Notes (Addendum)
?[place] ? ? ?MRN: [id number] DOB: 1980-12-30 ? ?Subjective:  ? ?Andrea [name] is a 41 y.o. female presenting for 1 day history of acute onset of throat pain, painful swallowing, and bilateral ear pain, coughing and some vomiting without nausea.  Patient has a history of allergic rhinitis and asthma, takes her medications consistently for this.  No chest pain, shortness of breath or wheezing.  She is a type II diabetic treated with insulin.  A1c was 7.5% on 07/31/2021.  She is not a smoker. ? ?No current facility-administered medications for this encounter. ? ?Current Outpatient Medications:  ?  albuterol (VENTOLIN HFA) 108 (90 Base) MCG/ACT inhaler, Inhale 1-2 puffs into the lungs every 6 (six) hours as needed for wheezing or shortness of breath., Disp: 18 g, Rfl: 0 ?  BINAXNOW COVID-19 AG HOME TEST KIT, See admin instructions. (Patient not taking: Reported on 08/08/2021), Disp: , Rfl:  ?  cetirizine (ZYRTEC ALLERGY) 10 MG tablet, Take 1 tablet (10 mg total) by mouth daily., Disp: 30 tablet, Rfl: 0 ?  doxepin (SINEQUAN) 50 MG capsule, Take 50 mg by mouth at bedtime., Disp: , Rfl:  ?  FLUoxetine (PROZAC) 20 MG capsule, Take 1 capsule (20 mg total) by mouth daily. (Patient taking differently: Take 60 mg by mouth daily.), Disp: 90 capsule, Rfl: 0 ?  gabapentin (NEURONTIN) 300 MG capsule, Take 1 capsule (300 mg total) by mouth 3 (three) times daily., Disp: 90 capsule, Rfl: 0 ?  ibuprofen (ADVIL) 800 MG tablet, Take 1 tablet (800 mg total) by mouth every 6 (six) hours as needed for moderate pain., Disp: 20 tablet, Rfl: 0 ?  JARDIANCE 10 MG TABS tablet, Take 25 mg by mouth daily., Disp: , Rfl:  ?  lisinopril-hydrochlorothiazide (ZESTORETIC) 20-25 MG tablet, Take 1 tablet by mouth daily., Disp: , Rfl:  ?  metoprolol succinate (TOPROL-XL) 25 MG 24 hr tablet, Take 1 tablet by mouth daily., Disp: , Rfl:  ?  OLANZapine (ZYPREXA) 10 MG tablet, Take 10 mg by mouth in the morning and at bedtime., Disp: ,  Rfl:  ?  oxybutynin (DITROPAN-XL) 5 MG 24 hr tablet, Take one tablet twice daily, Disp: 60 tablet, Rfl: 2 ?  pantoprazole (PROTONIX) 40 MG tablet, Take 1 tablet by mouth daily., Disp: , Rfl:  ?  promethazine-dextromethorphan (PROMETHAZINE-DM) 6.25-15 MG/5ML syrup, Take 5 mLs by mouth at bedtime as needed for cough. (Patient not taking: Reported on 07/31/2021), Disp: 100 mL, Rfl: 0 ?  RYBELSUS 7 MG TABS, Take 14 mg by mouth daily., Disp: , Rfl:  ?  tiZANidine (ZANAFLEX) 4 MG tablet, Take 4 mg by mouth every 8 (eight) hours as needed for muscle spasms., Disp: , Rfl:  ?  TRESIBA FLEXTOUCH 100 UNIT/ML FlexTouch Pen, Inject 20 Units into the skin daily., Disp: , Rfl:  ?  zonisamide (ZONEGRAN) 100 MG capsule, Take 1 capsule every night for 2 weeks, then increase to 2 capsules every night for 2 weeks, then increase to 3 capsules every night and continue, Disp: 90 capsule, Rfl: 6  ? ?Allergies  ?Allergen Reactions  ? Bactrim [Sulfamethoxazole-Trimethoprim] Hives and Itching  ? Hydrocodone-Acetaminophen Itching  ? Vicodin [Hydrocodone-Acetaminophen] Hives and Itching  ? ? ?Past Medical History:  ?Diagnosis Date  ? Anxiety   ? Bipolar 1 disorder ([place])   ? Complication of anesthesia   ? hard time waking up   ? Depression   ? Diabetes mellitus without complication ([place])   ? GERD (gastroesophageal reflux disease)   ?  no meds  ? Kidney stones   ? Low back pain   ? Migraines   ? PCOS (polycystic ovarian syndrome)   ? PONV (postoperative nausea and vomiting)   ? Schizophrenia ([place])   ? Shortness of breath dyspnea   ? with bronchitis  ?  ? ?Past Surgical History:  ?Procedure Laterality Date  ? ABDOMINAL HYSTERECTOMY N/A 11/14/2015  ? Procedure: HYSTERECTOMY ABDOMINAL;  Surgeon: Olga Millers, MD;  Location: [facility];  Service: Gynecology;  Laterality: N/A;  ? CHOLECYSTECTOMY    ? INTRAUTERINE DEVICE (IUD) INSERTION  06/14/2014  ? [facility]  ? LYMPH NODES REMOVED    ? ovarian cyst removed    ? SALPINGOOPHORECTOMY Bilateral  11/14/2015  ? Procedure: SALPINGO OOPHORECTOMY;  Surgeon: Olga Millers, MD;  Location: [facility];  Service: Gynecology;  Laterality: Bilateral;  ? ? ?Family History  ?Problem Relation Age of Onset  ? Healthy Mother   ? Healthy Father   ? ? ?Social History  ? ?Tobacco Use  ? Smoking status: Never  ? Smokeless tobacco: Never  ?Vaping Use  ? Vaping Use: Never used  ?Substance Use Topics  ? Alcohol use: No  ? Drug use: No  ? ? ?ROS ? ? ?Objective:  ? ?Vitals: ?BP 125/82 (BP Location: Right Arm)   Pulse 73   Temp (!) 97.4 ?F (36.3 ?C) (Oral)   Resp 18   LMP  (LMP Unknown)   SpO2 96%  ? ?Physical Exam ?Constitutional:   ?   General: She is not in acute distress. ?   Appearance: Normal appearance. She is well-developed. She is not ill-appearing, toxic-appearing or diaphoretic.  ?HENT:  ?   Head: Normocephalic and atraumatic.  ?   Right Ear: Tympanic membrane, ear canal and external ear normal. No drainage or tenderness. No middle ear effusion. There is no impacted cerumen. Tympanic membrane is not erythematous.  ?   Left Ear: Tympanic membrane, ear canal and external ear normal. No drainage or tenderness.  No middle ear effusion. There is no impacted cerumen. Tympanic membrane is not erythematous.  ?   Nose: Nose normal. No congestion or rhinorrhea.  ?   Mouth/Throat:  ?   Mouth: Mucous membranes are moist. No oral lesions.  ?   Pharynx: No pharyngeal swelling, oropharyngeal exudate, posterior oropharyngeal erythema or uvula swelling.  ?   Tonsils: No tonsillar exudate or tonsillar abscesses.  ?Eyes:  ?   General: No scleral icterus.    ?   Right eye: No discharge.     ?   Left eye: No discharge.  ?   Extraocular Movements: Extraocular movements intact.  ?   Right eye: Normal extraocular motion.  ?   Left eye: Normal extraocular motion.  ?   Conjunctiva/sclera: Conjunctivae normal.  ?Cardiovascular:  ?   Rate and Rhythm: Normal rate.  ?   Heart sounds: No murmur heard. ?  No friction rub. No gallop.  ?Pulmonary:  ?    Effort: Pulmonary effort is normal. No respiratory distress.  ?   Breath sounds: No stridor. No wheezing, rhonchi or rales.  ?Chest:  ?   Chest wall: No tenderness.  ?Musculoskeletal:  ?   Cervical back: Normal range of motion and neck supple.  ?Lymphadenopathy:  ?   Cervical: No cervical adenopathy.  ?Skin: ?   General: Skin is warm and dry.  ?Neurological:  ?   General: No focal deficit present.  ?   Mental Status: She is alert  and oriented to person, place, and time.  ?Psychiatric:     ?   Mood and Affect: Mood normal.     ?   Behavior: Behavior normal.  ? ? ?Results for orders placed or performed during the hospital encounter of 09/05/21 (from the past 24 hour(s))  ?POCT rapid strep A     Status: None  ? Collection Time: 09/05/21  7:27 PM  ?Result Value Ref Range  ? Rapid Strep A Screen Negative Negative  ? ? ?Assessment and Plan :  ? ?PDMP not reviewed this encounter. ? ?1. Acute viral syndrome   ?2. Throat pain   ?3. Acute ear pain, bilateral   ?4. Subacute cough   ?5. Vomiting without nausea, unspecified vomiting type   ?6. Allergic rhinitis, unspecified seasonality, unspecified trigger   ?7. Moderate persistent asthma without complication   ? ?Throat culture pending, recommended conservative management, supportive care for viral respiratory illness. Deferred imaging given clear cardiopulmonary exam, hemodynamically stable vital signs.  Maintain allergy and asthma medications.  Counseled patient on potential for adverse effects with medications prescribed/recommended today, ER and return-to-clinic precautions discussed, patient verbalized understanding. ?  ?Jaynee Eagles, PA-C ?09/05/21 1932 ? ?

## 2021-09-05 NOTE — ED Triage Notes (Signed)
Vomiting since yesterday, cough, sore throat and bilateral ear pain since yesterday ?

## 2021-09-08 LAB — CULTURE, GROUP A STREP (THRC)

## 2021-09-24 ENCOUNTER — Ambulatory Visit (INDEPENDENT_AMBULATORY_CARE_PROVIDER_SITE_OTHER): Payer: No Typology Code available for payment source | Admitting: Neurology

## 2021-09-24 DIAGNOSIS — R569 Unspecified convulsions: Secondary | ICD-10-CM

## 2021-10-02 ENCOUNTER — Observation Stay (HOSPITAL_COMMUNITY): Payer: No Typology Code available for payment source

## 2021-10-02 ENCOUNTER — Emergency Department (HOSPITAL_COMMUNITY): Payer: No Typology Code available for payment source

## 2021-10-02 ENCOUNTER — Encounter (HOSPITAL_COMMUNITY): Payer: Self-pay | Admitting: *Deleted

## 2021-10-02 ENCOUNTER — Other Ambulatory Visit: Payer: Self-pay

## 2021-10-02 ENCOUNTER — Inpatient Hospital Stay (HOSPITAL_COMMUNITY)
Admission: EM | Admit: 2021-10-02 | Discharge: 2021-10-03 | DRG: 101 | Disposition: A | Discharge status: Home / Self Care | Payer: No Typology Code available for payment source | Attending: Internal Medicine | Admitting: Internal Medicine

## 2021-10-02 DIAGNOSIS — E1165 Type 2 diabetes mellitus with hyperglycemia: Secondary | ICD-10-CM

## 2021-10-02 DIAGNOSIS — Z7984 Long term (current) use of oral hypoglycemic drugs: Secondary | ICD-10-CM

## 2021-10-02 DIAGNOSIS — E66813 Obesity, class 3: Secondary | ICD-10-CM

## 2021-10-02 DIAGNOSIS — E109 Type 1 diabetes mellitus without complications: Secondary | ICD-10-CM | POA: Diagnosis not present

## 2021-10-02 DIAGNOSIS — Z6841 Body Mass Index (BMI) 40.0 and over, adult: Secondary | ICD-10-CM

## 2021-10-02 DIAGNOSIS — F418 Other specified anxiety disorders: Secondary | ICD-10-CM | POA: Diagnosis present

## 2021-10-02 DIAGNOSIS — Z79899 Other long term (current) drug therapy: Secondary | ICD-10-CM

## 2021-10-02 DIAGNOSIS — Z20822 Contact with and (suspected) exposure to covid-19: Secondary | ICD-10-CM | POA: Diagnosis present

## 2021-10-02 DIAGNOSIS — I10 Essential (primary) hypertension: Secondary | ICD-10-CM | POA: Diagnosis present

## 2021-10-02 DIAGNOSIS — E119 Type 2 diabetes mellitus without complications: Secondary | ICD-10-CM

## 2021-10-02 DIAGNOSIS — F3130 Bipolar disorder, current episode depressed, mild or moderate severity, unspecified: Secondary | ICD-10-CM | POA: Diagnosis present

## 2021-10-02 DIAGNOSIS — K219 Gastro-esophageal reflux disease without esophagitis: Secondary | ICD-10-CM | POA: Diagnosis present

## 2021-10-02 DIAGNOSIS — G40901 Epilepsy, unspecified, not intractable, with status epilepticus: Secondary | ICD-10-CM | POA: Diagnosis not present

## 2021-10-02 DIAGNOSIS — F419 Anxiety disorder, unspecified: Secondary | ICD-10-CM | POA: Diagnosis present

## 2021-10-02 DIAGNOSIS — Z794 Long term (current) use of insulin: Secondary | ICD-10-CM

## 2021-10-02 DIAGNOSIS — R569 Unspecified convulsions: Secondary | ICD-10-CM

## 2021-10-02 LAB — CBC
HCT: 38.2 % (ref 36.0–46.0)
Hemoglobin: 12.1 g/dL (ref 12.0–15.0)
MCH: 30.3 pg (ref 26.0–34.0)
MCHC: 31.7 g/dL (ref 30.0–36.0)
MCV: 95.7 fL (ref 80.0–100.0)
Platelets: 204 10*3/uL (ref 150–400)
RBC: 3.99 MIL/uL (ref 3.87–5.11)
RDW: 13 % (ref 11.5–15.5)
WBC: 6.1 10*3/uL (ref 4.0–10.5)
nRBC: 0 % (ref 0.0–0.2)

## 2021-10-02 LAB — CBG MONITORING, ED
Glucose-Capillary: 126 mg/dL — ABNORMAL HIGH (ref 70–99)
Glucose-Capillary: 105 mg/dL — ABNORMAL HIGH (ref 70–99)
Glucose-Capillary: 154 mg/dL — ABNORMAL HIGH (ref 70–99)

## 2021-10-02 LAB — BASIC METABOLIC PANEL
Anion gap: 10 (ref 5–15)
BUN: 25 mg/dL — ABNORMAL HIGH (ref 6–20)
CO2: 25 mmol/L (ref 22–32)
Calcium: 9.2 mg/dL (ref 8.9–10.3)
Chloride: 102 mmol/L (ref 98–111)
Creatinine, Ser: 1.09 mg/dL — ABNORMAL HIGH (ref 0.44–1.00)
GFR, Estimated: >60 mL/min (ref >60)
Glucose, Bld: 145 mg/dL — ABNORMAL HIGH (ref 70–99)
Potassium: 3.6 mmol/L (ref 3.5–5.1)
Sodium: 137 mmol/L (ref 135–145)

## 2021-10-02 MED ORDER — ZONISAMIDE 100 MG PO CAPS
300.0000 mg | ORAL_CAPSULE | Freq: Every day | ORAL | Status: DC
  Administered 2021-10-02: 300 mg via ORAL
  Filled 2021-10-02: qty 3

## 2021-10-02 MED ORDER — LISINOPRIL-HYDROCHLOROTHIAZIDE 20-25 MG PO TABS: 1.0000 | ORAL_TABLET | Freq: Every day | ORAL | Status: DC

## 2021-10-02 MED ORDER — OLANZAPINE 5 MG PO TABS
10.0000 mg | ORAL_TABLET | Freq: Two times a day (BID) | ORAL | Status: DC
  Administered 2021-10-02 – 2021-10-03 (×2): 10 mg via ORAL
  Filled 2021-10-02 (×2): qty 2

## 2021-10-02 MED ORDER — ENOXAPARIN SODIUM 40 MG/0.4ML IJ SOSY
40.0000 mg | PREFILLED_SYRINGE | INTRAMUSCULAR | Status: DC
  Administered 2021-10-02: 40 mg via SUBCUTANEOUS
  Filled 2021-10-02: qty 0.4

## 2021-10-02 MED ORDER — INSULIN GLARGINE-YFGN 100 UNIT/ML ~~LOC~~ SOLN
15.0000 [IU] | Freq: Every day | SUBCUTANEOUS | Status: DC
  Administered 2021-10-03: 15 [IU] via SUBCUTANEOUS
  Filled 2021-10-02 (×2): qty 0.15

## 2021-10-02 MED ORDER — POLYETHYLENE GLYCOL 3350 17 G PO PACK: 17.0000 g | PACK | Freq: Every day | ORAL | Status: DC | PRN

## 2021-10-02 MED ORDER — METOPROLOL SUCCINATE ER 25 MG PO TB24
25.0000 mg | ORAL_TABLET | Freq: Every day | ORAL | Status: DC
  Administered 2021-10-03: 25 mg via ORAL
  Filled 2021-10-02: qty 1

## 2021-10-02 MED ORDER — DOXEPIN HCL 10 MG PO CAPS
50.0000 mg | ORAL_CAPSULE | Freq: Every day | ORAL | Status: DC
  Administered 2021-10-02: 50 mg via ORAL
  Filled 2021-10-02: qty 5

## 2021-10-02 MED ORDER — INSULIN ASPART 100 UNIT/ML IJ SOLN: 0.0000 [IU] | Freq: Every day | INTRAMUSCULAR | Status: DC

## 2021-10-02 MED ORDER — HYDROCODONE-ACETAMINOPHEN 5-325 MG PO TABS: 1.5000 | ORAL_TABLET | Freq: Four times a day (QID) | ORAL | Status: DC | PRN

## 2021-10-02 MED ORDER — OXYBUTYNIN CHLORIDE ER 5 MG PO TB24
5.0000 mg | ORAL_TABLET | Freq: Two times a day (BID) | ORAL | Status: DC
  Administered 2021-10-02 – 2021-10-03 (×2): 5 mg via ORAL
  Filled 2021-10-02 (×2): qty 1

## 2021-10-02 MED ORDER — INSULIN ASPART 100 UNIT/ML IJ SOLN
0.0000 [IU] | Freq: Three times a day (TID) | INTRAMUSCULAR | Status: DC
  Administered 2021-10-03: 2 [IU] via SUBCUTANEOUS
  Filled 2021-10-02: qty 1

## 2021-10-02 MED ORDER — KETOROLAC TROMETHAMINE 15 MG/ML IJ SOLN
15.0000 mg | Freq: Once | INTRAMUSCULAR | Status: AC
  Administered 2021-10-02: 15 mg via INTRAVENOUS
  Filled 2021-10-02: qty 1

## 2021-10-02 MED ORDER — GABAPENTIN 300 MG PO CAPS
300.0000 mg | ORAL_CAPSULE | Freq: Three times a day (TID) | ORAL | Status: DC
  Administered 2021-10-02 – 2021-10-03 (×2): 300 mg via ORAL
  Filled 2021-10-02 (×2): qty 1

## 2021-10-02 MED ORDER — PANTOPRAZOLE SODIUM 40 MG PO TBEC
40.0000 mg | DELAYED_RELEASE_TABLET | Freq: Every day | ORAL | Status: DC
  Administered 2021-10-03: 40 mg via ORAL
  Filled 2021-10-02: qty 1

## 2021-10-02 MED ORDER — METOCLOPRAMIDE HCL 5 MG/ML IJ SOLN
10.0000 mg | Freq: Once | INTRAMUSCULAR | Status: AC
  Administered 2021-10-02: 10 mg via INTRAVENOUS
  Filled 2021-10-02: qty 2

## 2021-10-02 MED ORDER — FLUOXETINE HCL 20 MG PO CAPS
40.0000 mg | ORAL_CAPSULE | Freq: Every day | ORAL | Status: DC
  Administered 2021-10-03: 40 mg via ORAL
  Filled 2021-10-02: qty 2

## 2021-10-02 NOTE — Procedures (Signed)
ELECTROENCEPHALOGRAM REPORT ? ?Dates of Recording: 09/24/2021 9:35AM to 09/26/2021 9:34AM ? ?Patient's Name: Andrea [name] ?MRN: [id number] ?Date of Birth: 1981/05/04 ? ?Referring Provider: Dr. Ellouise Newer ? ?Procedure: 48-hour ambulatory EEG ? ?History: This is a 41 year old woman with new onset seizures.  ? ?Medications:  ?Zonisamide ?Gabapentin ?VENTOLIN HFA 108 (90 Base) MCG/ACT inhaler ?ZYRTEC ALLERGY 10 MG tablet ?SINEQUAN 50 MG capsule ?PROZAC 20 MG capsule ?NEURONTIN 300 MG capsule ?ADVIL 800 MG tablet ?JARDIANCE 10 MG TABS tablet ?ZESTORETIC 20-25 MG tablet ?TOPROL-XL 25 MG 24 hr tablet ?ZYPREXA 10 MG tablet ?DITROPAN-XL 5 MG 24 hr tablet ?PROTONIX 40 MG tablet ?RYBELSUS 7 MG TABS ?ZANAFLEX 4 MG tablet ? ?Technical Summary: ?This is a 48-hour multichannel digital EEG recording measured by the international 10-20 system with electrodes applied with paste and impedances below 5000 ohms performed as portable with EKG monitoring.  The digital EEG was referentially recorded, reformatted, and digitally filtered in a variety of bipolar and referential montages for optimal display.   ? ?DESCRIPTION OF RECORDING: ?During maximal wakefulness, the background activity consisted of a symmetric 10-11 Hz posterior dominant rhythm which was reactive to eye opening.  There were no epileptiform discharges or focal slowing seen in wakefulness. ? ?During the recording, the patient progresses through wakefulness, drowsiness, and Stage 2 sleep.  Again, there were no epileptiform discharges seen. ? ?Events: ?Patient notes a headache at 9:43 but did not note date. There is a push button on 4/10 at 9:38 hours with no EEG or EKG changes seen. Patient not on video. ? ? ?There were no electrographic seizures seen.  EKG lead was unremarkable. ? ?IMPRESSION: This 48-hour ambulatory EEG study is normal.   ? ?CLINICAL CORRELATION: A normal EEG does not exclude a clinical diagnosis of epilepsy. Typical events were not captured. If  further clinical questions remain, inpatient video EEG monitoring may be helpful. ? ? ?Ellouise Newer, M.D. ? ?

## 2021-10-02 NOTE — Assessment & Plan Note (Addendum)
Five seizures over the past 24 hours.  ? Last seizure here in ED while waiting to be seen.   ?No seizure witnessed by hospital staff.  Currently back to baseline.   ?Recent hospitalization for same 07/2021, with normal MRI and routine EEG.  Gabapentin dose was increased from 100-300, zonisamide was started as an outpatient by her outpatient neurologist.  She reports compliance with both. ?-Per Care Everywhere 08/09/2016 ,  patient was evaluated at [facility] by neurology for right sided tingling, slurred speech, right hemibody weakness, and spells.  Also documented- boyfriend reported onset of seizures that started October 2017.  A spell was captured during routine EEG and was nonepileptic.  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 (Please see notes for full details). ?- EDP contacted patients outpatient neurologist, Dr. [name], recommended admission to [facility] for continuous EEG monitoring ?-Seizure precautions ?-Continue zonisamide and gabapentin ?-Obtain head CT--neg for acute findings ?-Complaints of right sided pain, obtain right rib xray--neg ?10/03/21--I spoke with Dr. [name] and updated her on patient.  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 ?

## 2021-10-02 NOTE — Assessment & Plan Note (Signed)
Resume doxepin, olanzapine, fluoxetine. ?

## 2021-10-02 NOTE — ED Triage Notes (Signed)
Pt had witnessed seizure while at work; witness states pt had 2 while at work that lasted a total of 3 minutes ?

## 2021-10-02 NOTE — ED Notes (Signed)
Snack and drink given 

## 2021-10-02 NOTE — Assessment & Plan Note (Signed)
A1c 7.5. Controlled.  She has continuous glucose monitoring which will have to be removed so that head CT and x-rays can be done, she was initially hesitant to remove it. ?- SSI- M ?-Hold Jardiance, Rybelsus while inpatient ?-Resume Tresiba at slightly reduced dose 15u daily (Home dose 20u) ?

## 2021-10-02 NOTE — H&P (Signed)
?History and Physical  ? ? ?Andrea [name] UMP:[id number] DOB: 31-Mar-1981 DOA: 10/02/2021 ? ?PCP: [name], NP  ? ?Patient coming from: Home ? ?I have personally briefly reviewed patient's old medical records in [place] ? ?Chief Complaint: Seizures ? ?HPI: Andrea [name] is a 41 y.o. female with medical history significant for  recent new onset seizures, DM, BPD, schizophrenia. ?Patient was brought to the ED today after reports of multiple seizures.  Patient had her first seizure at home at about 6:30 PM yesterday.,  Then a second seizure at about 12:25 a.m, it hurt this morning at about 8.25 a.m. Later today at about 1pm she had another seizure-back-to-back, while she was at work.  This seizure was more violent than the others.  She had the first 1 and the second 1 followed barely 10 to 20 seconds after.  She had not recovered from the first.  Then she had a second similar back-to-back seizure here in the ED while waiting to be seen at about 2pm,  ? ?Patient is unaware when these seizures are about to occur.  Spouse reports patient started staring blankly and then starts blinking her eyelids fast and then her whole body upper and lower extremities jerking.  Yesterday for the first time she had an episode of urinary incontinence.  But no bowel incontinence, tongue biting, foaming noted.  Spouse estimates that her seizures usually last about a minute to minute and a half.  After the seizure, she is unresponsive, breathing normally but with eyes closed, this usually lasts about 2 to 2-1/2 minutes. ?At time of my evaluation patient is back to baseline, awake alert oriented x 4 answering questions appropriately.  She reports a headache, and pain on right side of her rib that started today which she thinks she may have hurt during her seizures. ? ?Recent hospitalization 2/14 -2/15 for generalized tonic-clonic seizure, MRI and EEG were both unremarkable, patient gabapentin was increased from 100  to 300 mg 3 times daily.  She subsequently established care with outpatient neurologist Dr. [name], and zonisamide was added.  ? ?Patient reports since that visit she had another seizure on March 10.  She has been compliant with both zonisamide and gabapentin.  She denies recent change in medications.  Denies alcohol or illicit drug use.  Denies GI, GU, respiratory symptoms, no fevers..  No recent or unusual stressors.  No known family history of seizures.  Patient completed a 48 -hour ambulatory EEG - 4/10 to 4/12, which was normal. ? ?ED Course: Stable vitals.  Unremarkable BMP CBC. ?Patient's neurologist Dr. [name] was made aware that patient was in the hospital, with above symptoms, recommended admission for continuous EEG monitoring.  Patient will be admitted to [facility]. ? ?Review of Systems: As per HPI all other systems reviewed and negative. ? ?Past Medical History:  ?Diagnosis Date  ? Anxiety   ? Bipolar 1 disorder ([place])   ? Complication of anesthesia   ? hard time waking up   ? Depression   ? Diabetes mellitus without complication ([place])   ? GERD (gastroesophageal reflux disease)   ? no meds  ? Kidney stones   ? Low back pain   ? Migraines   ? PCOS (polycystic ovarian syndrome)   ? PONV (postoperative nausea and vomiting)   ? Schizophrenia ([place])   ? Shortness of breath dyspnea   ? with bronchitis  ? ? ?Past Surgical History:  ?Procedure Laterality Date  ? ABDOMINAL HYSTERECTOMY N/A  11/14/2015  ? Procedure: HYSTERECTOMY ABDOMINAL;  Surgeon: Olga Millers, MD;  Location: [facility];  Service: Gynecology;  Laterality: N/A;  ? CHOLECYSTECTOMY    ? INTRAUTERINE DEVICE (IUD) INSERTION  06/14/2014  ? [facility]  ? LYMPH NODES REMOVED    ? ovarian cyst removed    ? SALPINGOOPHORECTOMY Bilateral 11/14/2015  ? Procedure: SALPINGO OOPHORECTOMY;  Surgeon: Olga Millers, MD;  Location: [facility];  Service: Gynecology;  Laterality: Bilateral;  ? ? ? reports that she has never smoked. She has never used smokeless  tobacco. She reports that she does not drink alcohol and does not use drugs. ? ?Allergies  ?Allergen Reactions  ? Bactrim [Sulfamethoxazole-Trimethoprim] Hives and Itching  ? Hydrocodone-Acetaminophen Itching  ? Vicodin [Hydrocodone-Acetaminophen] Hives and Itching  ? ? ?Family History  ?Problem Relation Age of Onset  ? Healthy Mother   ? Healthy Father   ? ? ?Prior to Admission medications   ?Medication Sig Start Date End Date Taking? Authorizing Provider  ?albuterol (VENTOLIN HFA) 108 (90 Base) MCG/ACT inhaler Inhale 1-2 puffs into the lungs every 6 (six) hours as needed for wheezing or shortness of breath. 04/15/21   Jaynee Eagles, PA-C  ?benzonatate (TESSALON) 100 MG capsule Take 1-2 capsules (100-200 mg total) by mouth 3 (three) times daily as needed for cough. 09/05/21   Jaynee Eagles, PA-C  ?BINAXNOW COVID-19 AG HOME TEST KIT See admin instructions. ?Patient not taking: Reported on 08/08/2021 08/31/20   [provider]  ?cetirizine (ZYRTEC ALLERGY) 10 MG tablet Take 1 tablet (10 mg total) by mouth daily. 04/15/21   Jaynee Eagles, PA-C  ?doxepin (SINEQUAN) 50 MG capsule Take 50 mg by mouth at bedtime. 06/04/21   [provider]  ?FLUoxetine (PROZAC) 20 MG capsule Take 1 capsule (20 mg total) by mouth daily. ?Patient taking differently: Take 60 mg by mouth daily. 08/14/17   Kathyrn Drown, MD  ?gabapentin (NEURONTIN) 300 MG capsule Take 1 capsule (300 mg total) by mouth 3 (three) times daily. 08/01/21   Allie Bossier, MD  ?ibuprofen (ADVIL) 800 MG tablet Take 1 tablet (800 mg total) by mouth every 6 (six) hours as needed for moderate pain. 02/02/20   Orpah Greek, MD  ?JARDIANCE 10 MG TABS tablet Take 25 mg by mouth daily. 09/11/20   [provider]  ?lisinopril-hydrochlorothiazide (ZESTORETIC) 20-25 MG tablet Take 1 tablet by mouth daily.    [provider]  ?metoprolol succinate (TOPROL-XL) 25 MG 24 hr tablet Take 1 tablet by mouth daily. 08/17/20   [provider]   ?OLANZapine (ZYPREXA) 10 MG tablet Take 10 mg by mouth in the morning and at bedtime.    [provider]  ?ondansetron (ZOFRAN-ODT) 8 MG disintegrating tablet Take 1 tablet (8 mg total) by mouth every 8 (eight) hours as needed for nausea or vomiting. 09/05/21   Jaynee Eagles, PA-C  ?oxybutynin (DITROPAN-XL) 5 MG 24 hr tablet Take one tablet twice daily 09/04/17   Kathyrn Drown, MD  ?pantoprazole (PROTONIX) 40 MG tablet Take 1 tablet by mouth daily. 08/31/20   [provider]  ?promethazine-dextromethorphan (PROMETHAZINE-DM) 6.25-15 MG/5ML syrup Take 5 mLs by mouth at bedtime as needed for cough. 09/05/21   Jaynee Eagles, PA-C  ?RYBELSUS 7 MG TABS Take 14 mg by mouth daily. 09/17/20   [provider]  ?tiZANidine (ZANAFLEX) 4 MG tablet Take 4 mg by mouth every 8 (eight) hours as needed for muscle spasms.    [provider]  ?[name]  FLEXTOUCH 100 UNIT/ML FlexTouch Pen Inject 20 Units into the skin daily. 04/04/21   [provider]  ?zonisamide (ZONEGRAN) 100 MG capsule Take 1 capsule every night for 2 weeks, then increase to 2 capsules every night for 2 weeks, then increase to 3 capsules every night and continue 08/08/21   Cameron Sprang, MD  ?fluticasone (FLOVENT HFA) 110 MCG/ACT inhaler Inhale 2 puffs into the lungs 2 (two) times daily. For shortness of breath ?Patient not taking: Reported on 09/26/2020 12/24/16 10/27/20  Lindell Spar I, NP  ?metFORMIN (GLUCOPHAGE) 500 MG tablet Take 1 tablet (500 mg total) by mouth 2 (two) times daily with a meal. ?Patient not taking: No sig reported 01/01/20 10/27/20  Noemi Chapel, MD  ?trazodone (DESYREL) 300 MG tablet Take 300 mg by mouth at bedtime. ?Patient not taking: Reported on 09/26/2020  10/27/20  [provider]  ? ? ?Physical Exam: ?Vitals:  ? 10/02/21 1315 10/02/21 1344 10/02/21 1415  ?BP: 122/78 106/78 110/70  ?Pulse: 69 87 62  ?Resp: 18  18  ?Temp: 97.6 ?F (36.4 ?C) 97.7 ?F (36.5 ?C)   ?TempSrc: Oral Oral   ?SpO2: 97% 100%  98%  ?Weight: 136.1 kg    ?Height: 5' 11"  (1.803 m)    ? ? ?Constitutional: NAD, calm, comfortable ?Vitals:  ? 10/02/21 1315 10/02/21 1344 10/02/21 1415  ?BP: 122/78 106/78 110/70  ?Pulse: 69 87 62  ?Resp

## 2021-10-02 NOTE — Assessment & Plan Note (Addendum)
Stable. ?-Resume metoprolol ?Holding lisino/HCTZ due to soft BP overnight>>restart after d/c ?

## 2021-10-03 DIAGNOSIS — G40901 Epilepsy, unspecified, not intractable, with status epilepticus: Secondary | ICD-10-CM | POA: Diagnosis present

## 2021-10-03 DIAGNOSIS — R569 Unspecified convulsions: Secondary | ICD-10-CM

## 2021-10-03 DIAGNOSIS — F419 Anxiety disorder, unspecified: Secondary | ICD-10-CM | POA: Diagnosis present

## 2021-10-03 DIAGNOSIS — Z79899 Other long term (current) drug therapy: Secondary | ICD-10-CM | POA: Diagnosis not present

## 2021-10-03 DIAGNOSIS — Z794 Long term (current) use of insulin: Secondary | ICD-10-CM | POA: Diagnosis not present

## 2021-10-03 DIAGNOSIS — E1165 Type 2 diabetes mellitus with hyperglycemia: Secondary | ICD-10-CM | POA: Diagnosis present

## 2021-10-03 DIAGNOSIS — F3130 Bipolar disorder, current episode depressed, mild or moderate severity, unspecified: Secondary | ICD-10-CM | POA: Diagnosis present

## 2021-10-03 DIAGNOSIS — Z20822 Contact with and (suspected) exposure to covid-19: Secondary | ICD-10-CM | POA: Diagnosis present

## 2021-10-03 DIAGNOSIS — Z6841 Body Mass Index (BMI) 40.0 and over, adult: Secondary | ICD-10-CM | POA: Diagnosis not present

## 2021-10-03 DIAGNOSIS — I10 Essential (primary) hypertension: Secondary | ICD-10-CM | POA: Diagnosis present

## 2021-10-03 DIAGNOSIS — Z7984 Long term (current) use of oral hypoglycemic drugs: Secondary | ICD-10-CM | POA: Diagnosis not present

## 2021-10-03 DIAGNOSIS — K219 Gastro-esophageal reflux disease without esophagitis: Secondary | ICD-10-CM | POA: Diagnosis present

## 2021-10-03 HISTORY — DX: Unspecified convulsions: R56.9

## 2021-10-03 LAB — CBG MONITORING, ED: Glucose-Capillary: 146 mg/dL — ABNORMAL HIGH (ref 70–99)

## 2021-10-03 NOTE — Hospital Course (Addendum)
41 y.o. female with medical history significant for  recent new onset seizures, DM, Bipolar disorder, schizophrenia.brought to the ED 4/18 after reports of multiple seizures.  Essentially, the patient had 5 episodes of seizure activity in the last 24 hours, with the last episode upon arrival to the emergency department. ?Patient is unaware when these seizures are about to occur.  Spouse reports patient started staring blankly and then starts blinking her eyelids fast and then her whole body upper and lower extremities jerking.  Yesterday for the first time she had an episode of urinary incontinence.  But no bowel incontinence, tongue biting, foaming noted.  Spouse estimates that her seizures usually last about a minute to minute and a half.  After the seizure, she is unresponsive, breathing normally but with eyes closed, this usually lasts about 2 to 2-1/2 minutes. ?At time of my evaluation patient is back to baseline, awake alert oriented x 4 answering questions appropriately.  She reports a headache, and pain on right side of her rib that started today which she thinks she may have hurt during her seizures. ?She had Recent hospitalization 2/14 -2/15 for generalized tonic-clonic seizure, MRI and EEG were both unremarkable, patient gabapentin was increased from 100 to 300 mg 3 times daily.  She subsequently established care with outpatient neurologist Dr. [name] on 08/08/21, and zonisamide was added and titrated up to 300 mg q hs.  ?  ?Patient reports since that visit she had another seizure on March 10.  She has been compliant with both zonisamide and gabapentin.  She denies recent change in medications.  Denies alcohol or illicit drug use.  Denies GI, GU, respiratory symptoms, no fevers..  No recent or unusual stressors.  No known family history of seizures.  Patient completed a 48 -hour ambulatory EEG - 4/10 to 4/12, which was normal. ?I spoke with Dr. [name] later in the day on 10/03/21 and updated her on patient.   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

## 2021-10-03 NOTE — Progress Notes (Signed)
?  ?       ?PROGRESS NOTE ? ?Andrea [name] QGB:[id number] DOB: 28-Dec-1980 DOA: 10/02/2021 ?PCP: [name], NP ? ?Brief History:  ?41 y.o. female with medical history significant for  recent new onset seizures, DM, Bipolar disorder, schizophrenia.brought to the ED 4/18 after reports of multiple seizures.  Essentially, the patient had 5 episodes of seizure activity in the last 24 hours, with the last episode upon arrival to the emergency department. ?Patient is unaware when these seizures are about to occur.  Spouse reports patient started staring blankly and then starts blinking her eyelids fast and then her whole body upper and lower extremities jerking.  Yesterday for the first time she had an episode of urinary incontinence.  But no bowel incontinence, tongue biting, foaming noted.  Spouse estimates that her seizures usually last about a minute to minute and a half.  After the seizure, she is unresponsive, breathing normally but with eyes closed, this usually lasts about 2 to 2-1/2 minutes. ?At time of my evaluation patient is back to baseline, awake alert oriented x 4 answering questions appropriately.  She reports a headache, and pain on right side of her rib that started today which she thinks she may have hurt during her seizures. ?She had Recent hospitalization 2/14 -2/15 for generalized tonic-clonic seizure, MRI and EEG were both unremarkable, patient gabapentin was increased from 100 to 300 mg 3 times daily.  She subsequently established care with outpatient neurologist Dr. [name] on 08/08/21, and zonisamide was added and titrated up to 300 mg q hs.  ?  ?Patient reports since that visit she had another seizure on March 10.  She has been compliant with both zonisamide and gabapentin.  She denies recent change in medications.  Denies alcohol or illicit drug use.  Denies GI, GU, respiratory symptoms, no fevers..  No recent or unusual stressors.  No known family history of seizures.  Patient  completed a 48 -hour ambulatory EEG - 4/10 to 4/12, which was normal.  ? ? ? ?Assessment and Plan: ?* Seizures ([place]) ?Five seizures over the past 24 hours.  ? Last seizure here in ED while waiting to be seen.   ?No seizure witnessed by hospital staff.  Currently back to baseline.   ?Recent hospitalization for same 07/2021, with normal MRI and routine EEG.  Gabapentin dose was increased from 100-300, zonisamide was started as an outpatient by her outpatient neurologist.  She reports compliance with both. ?-Per Care Everywhere 08/09/2016 ,  patient was evaluated at [facility] by neurology for right sided tingling, slurred speech, right hemibody weakness, and spells.  Also documented- boyfriend reported onset of seizures that started October 2017.  A spell was captured during routine EEG and was nonepileptic.  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 (Please see notes for full details). ?- EDP contacted patients outpatient neurologist, Dr. [name], recommended admission to [facility] for continuous EEG monitoring ?-Seizure precautions ?-Continue zonisamide and gabapentin ?-Obtain head CT--neg for acute findings ?-Complaints of right sided pain, obtain right rib xray--neg ? ? ?Bipolar I disorder, most recent episode depressed ([place]) ?Resume doxepin, olanzapine, fluoxetine. ? ?HTN (hypertension) ?Stable. ?-Resume metoprolol ?Holding lisino/HCTZ due to soft BP overnight ? ?Uncontrolled type 2 diabetes mellitus with hyperglycemia, with long-term current use of insulin ([place]) ?A1c 7.5. Controlled.  She has continuous glucose monitoring which will have to be removed so that head CT and x-rays can be done, she was initially hesitant to remove it. ?-  SSI- M ?-Hold Jardiance, Rybelsus while inpatient ?-Resume [name]  ? ?Obesity, Class III, BMI 40-49.9 (morbid obesity) ([place]) ?BMI 41.89 ?Lifestyle modification ? ? ? ? ?Family Communication:   no Family at bedside ? ?Consultants:  neurology ? ?Code  Status:  FULL  ? ?DVT Prophylaxis: [place] Lovenox ? ? ?Procedures: ?As Listed in Progress Note Above ? ?Antibiotics: ?None ? ? ? ? ? ? ?Subjective: ?Patient denies fevers, chills, headache, chest pain, dyspnea, nausea, vomiting, diarrhea, abdominal pain, dysuria, hematuria, hematochezia, and melena. ? ? ?Objective: ?Vitals:  ? 10/02/21 2011 10/02/21 2310 10/03/21 0530 10/03/21 0711  ?BP: 126/80 117/75 124/82 (!) 101/57  ?Pulse: 70 (!) 59 72 (!) 57  ?Resp: '18 19 18 13  '$ ?Temp:      ?TempSrc:      ?SpO2: 97% 96% 99% 93%  ?Weight:      ?Height:      ? ?No intake or output data in the 24 hours ending 10/03/21 0731 ?Weight change:  ?Exam: ? ?General:  Pt is alert, follows commands appropriately, not in acute distress ?HEENT: No icterus, No thrush, No neck mass, [place]/AT ?Cardiovascular: RRR, S1/S2, no rubs, no gallops ?Respiratory: CTA bilaterally, no wheezing, no crackles, no rhonchi ?Abdomen: Soft/+BS, non tender, non distended, no guarding ?Extremities: No edema, No lymphangitis, No petechiae, No rashes, no synovitis ? ? ?Data Reviewed: ?I have personally reviewed following labs and imaging studies ?Basic Metabolic Panel: ?Recent Labs  ?Lab 10/02/21 ?1334  ?NA 137  ?K 3.6  ?CL 102  ?CO2 25  ?GLUCOSE 145*  ?BUN 25*  ?CREATININE 1.09*  ?CALCIUM 9.2  ? ?Liver Function Tests: ?No results for input(s): AST, ALT, ALKPHOS, BILITOT, PROT, ALBUMIN in the last 168 hours. ?No results for input(s): LIPASE, AMYLASE in the last 168 hours. ?No results for input(s): AMMONIA in the last 168 hours. ?Coagulation Profile: ?No results for input(s): INR, PROTIME in the last 168 hours. ?CBC: ?Recent Labs  ?Lab 10/02/21 ?1334  ?WBC 6.1  ?HGB 12.1  ?HCT 38.2  ?MCV 95.7  ?PLT 204  ? ?Cardiac Enzymes: ?No results for input(s): CKTOTAL, CKMB, CKMBINDEX, TROPONINI in the last 168 hours. ?BNP: ?Invalid input(s): POCBNP ?CBG: ?Recent Labs  ?Lab 10/02/21 ?1402 10/02/21 ?1834 10/02/21 ?2122  ?GLUCAP 126* 105* 154*  ? ?HbA1C: ?No results for input(s): HGBA1C  in the last 72 hours. ?Urine analysis: ?   ?Component Value Date/Time  ? COLORURINE STRAW (A) 07/31/2021 1256  ? APPEARANCEUR CLEAR 07/31/2021 1256  ? LABSPEC 1.028 07/31/2021 1256  ? PHURINE 5.0 07/31/2021 1256  ? GLUCOSEU >=500 (A) 07/31/2021 1256  ? [place] NEGATIVE 07/31/2021 1256  ? [place] NEGATIVE 07/31/2021 1256  ? BILIRUBINUR negative 03/29/2021 1408  ? BILIRUBINUR ++ 12/31/2016 1042  ? [place] NEGATIVE 07/31/2021 1256  ? PROTEINUR NEGATIVE 07/31/2021 1256  ? UROBILINOGEN 0.2 03/29/2021 1408  ? UROBILINOGEN 0.2 04/25/2014 1315  ? NITRITE NEGATIVE 07/31/2021 1256  ? LEUKOCYTESUR NEGATIVE 07/31/2021 1256  ? ?Sepsis Labs: ?'@LABRCNTIP'$ (procalcitonin:4,lacticidven:4) ?)No results found for this or any previous visit (from the past 240 hour(s)).  ? ?Scheduled Meds: ? doxepin  50 mg Oral QHS  ? enoxaparin (LOVENOX) injection  40 mg Subcutaneous Q24H  ? FLUoxetine  40 mg Oral Daily  ? gabapentin  300 mg Oral TID  ? insulin aspart  0-15 Units Subcutaneous TID WC  ? insulin aspart  0-5 Units Subcutaneous QHS  ? insulin degludec  15 Units Subcutaneous Daily  ? lisinopril-hydrochlorothiazide  1 tablet Oral Daily  ? metoprolol succinate  25 mg  Oral Daily  ? OLANZapine  10 mg Oral BID  ? oxybutynin  5 mg Oral BID  ? pantoprazole  40 mg Oral Daily  ? zonisamide  300 mg Oral QHS  ? ?Continuous Infusions: ? ?Procedures/Studies: ?DG Ribs Unilateral W/Chest Right ? ?Result Date: 10/02/2021 ?CLINICAL DATA:  Lower right rib pain after fall. EXAM: RIGHT RIBS AND CHEST - 3+ VIEW COMPARISON:  Chest x-ray 08/18/2021. FINDINGS: No fracture or other bone lesions are seen involving the ribs. There is no evidence of pneumothorax or pleural effusion. Both lungs are clear. Heart size and mediastinal contours are within normal limits. IMPRESSION: Negative. Electronically Signed   By: Ronney Asters M.D.   On: 10/02/2021 17:51  ? ?CT HEAD WO CONTRAST (5MM) ? ?Result Date: 10/02/2021 ?CLINICAL DATA:  Multiple witnessed seizures. EXAM: CT  HEAD WITHOUT CONTRAST TECHNIQUE: Contiguous axial images were obtained from the base of the skull through the vertex without intravenous contrast. RADIATION DOSE REDUCTION: This exam was performed according to

## 2021-10-03 NOTE — ED Notes (Signed)
Pt eating breakfast at this time.  

## 2021-10-03 NOTE — Assessment & Plan Note (Signed)
A1c 7.5. Controlled.  She has continuous glucose monitoring which will have to be removed so that head CT and x-rays can be done, she was initially hesitant to remove it. ?- SSI- M ?-Hold Jardiance, Rybelsus while inpatient ?-Resume [name]  ?

## 2021-10-03 NOTE — TOC Progression Note (Signed)
Transition of Care (TOC) - Progression Note  ? ? ?Patient Details  ?Name: Andrea [name] ?MRN: [id number] ?Date of Birth: [birth year] ? ?Transition of Care (TOC) CM/SW Contact  ?Salome Arnt, LCSW ?Phone Number: ?10/03/2021, 10:22 AM ? ?Clinical Narrative:   ?Transition of Care (TOC) Screening Note ? ? ?Patient Details  ?Name: Andrea [name] ?Date of Birth: January 07, 1981 ? ? ?Transition of Care (TOC) CM/SW Contact:    ?Salome Arnt, LCSW ?Phone Number: ?10/03/2021, 10:22 AM ? ? ? ?Transition of Care Department [facility]) has reviewed patient and no TOC needs have been identified at this time. We will continue to monitor patient advancement through interdisciplinary progression rounds. If new patient transition needs arise, please place a TOC consult. ?   ? ? ? ?  ?  ? ?Expected Discharge Plan and Services ?  ?  ?  ?  ?  ?Expected Discharge Date: 10/03/21               ?  ?  ?  ?  ?  ?  ?  ?  ?  ?  ? ? ?Social Determinants of Health (SDOH) Interventions ?  ? ?Readmission Risk Interventions ?   ? View : No data to display.  ?  ?  ?  ? ? ?

## 2021-10-03 NOTE — ED Provider Notes (Signed)
?[facility] ?Provider Note ? ? ?CSN: [id number] ?Arrival date & time: 10/02/21  1309 ? ?  ? ?History ? ?Chief Complaint  ?Patient presents with  ? Seizures  ? ? ?Andrea [name] is a 41 y.o. female. ? ?HPI ? ?  ? ?41 y.o. female with medical history significant for recent new onset seizures, DM, BPD, schizophrenia comes in after multiple episodes of seizure.  Husband is at the bedside. ? ?Patient was diagnosed with seizure disorder in February.  She was started on couple of antiepileptics.  She recently just had her outpatient EEG completed.  She indicates that she was seizure-free for more than a month, until yesterday when she had an episodes in the evening.  Today at work she had 2 or 3 episodes of seizure (informed to her by her colleagues), and per husband, patient had at least 2 more episodes while awaiting to see me. ? ?Patient denies incontinence. ? ?She indicates that she has been taking her medications as prescribed.  No substance use. ? ?Home Medications ?Prior to Admission medications   ?Medication Sig Start Date End Date Taking? Authorizing Provider  ?albuterol (VENTOLIN HFA) 108 (90 Base) MCG/ACT inhaler Inhale 1-2 puffs into the lungs every 6 (six) hours as needed for wheezing or shortness of breath. 04/15/21  Yes Jaynee Eagles, PA-C  ?cetirizine (ZYRTEC ALLERGY) 10 MG tablet Take 1 tablet (10 mg total) by mouth daily. 04/15/21  Yes Jaynee Eagles, PA-C  ?doxepin (SINEQUAN) 50 MG capsule Take 50 mg by mouth at bedtime. 06/04/21  Yes [provider]  ?FLUoxetine (PROZAC) 20 MG capsule Take 1 capsule (20 mg total) by mouth daily. ?Patient taking differently: Take 60 mg by mouth daily. 08/14/17  Yes Kathyrn Drown, MD  ?gabapentin (NEURONTIN) 300 MG capsule Take 1 capsule (300 mg total) by mouth 3 (three) times daily. 08/01/21  Yes Allie Bossier, MD  ?HYDROcodone-acetaminophen (NORCO) 7.5-325 MG tablet Take 1 tablet by mouth every 6 (six) hours as needed. 09/20/21  Yes [provider]  ?JARDIANCE 25 MG TABS tablet Take 25 mg by mouth daily. 10/01/21  Yes [provider]  ?lisinopril-hydrochlorothiazide (ZESTORETIC) 20-25 MG tablet Take 1 tablet by mouth daily.   Yes [provider]  ?meloxicam (MOBIC) 15 MG tablet Take 15 mg by mouth daily. 08/30/21  Yes [provider]  ?metoprolol succinate (TOPROL-XL) 25 MG 24 hr tablet Take 1 tablet by mouth daily. 08/17/20  Yes [provider]  ?OLANZapine (ZYPREXA) 10 MG tablet Take 10 mg by mouth in the morning and at bedtime.   Yes [provider]  ?oxybutynin (DITROPAN-XL) 5 MG 24 hr tablet Take one tablet twice daily 09/04/17  Yes Luking, Scott A, MD  ?pantoprazole (PROTONIX) 40 MG tablet Take 1 tablet by mouth daily. 08/31/20  Yes [provider]  ?RYBELSUS 14 MG TABS Take 1 tablet by mouth daily. 09/17/21  Yes [provider]  ?tiZANidine (ZANAFLEX) 4 MG tablet Take 4 mg by mouth every 8 (eight) hours as needed for muscle spasms.   Yes [provider]  ?TRESIBA FLEXTOUCH 100 UNIT/ML FlexTouch Pen Inject 20 Units into the skin daily. 04/04/21  Yes [provider]  ?zonisamide (ZONEGRAN) 100 MG capsule Take 1 capsule every night for 2 weeks, then increase to 2 capsules every night for 2 weeks, then increase to 3 capsules every night and continue ?Patient taking differently: Take 300 mg by mouth at bedtime. 08/08/21  Yes Cameron Sprang, MD  ?  benzonatate (TESSALON) 100 MG capsule Take 1-2 capsules (100-200 mg total) by mouth 3 (three) times daily as needed for cough. ?Patient not taking: Reported on 10/02/2021 09/05/21   Jaynee Eagles, PA-C  ?BINAXNOW COVID-19 AG HOME TEST KIT See admin instructions. ?Patient not taking: Reported on 08/08/2021 08/31/20   [provider]  ?ibuprofen (ADVIL) 800 MG tablet Take 1 tablet (800 mg total) by mouth every 6 (six) hours as needed for moderate pain. ?Patient not taking: Reported on 10/02/2021 02/02/20   Orpah Greek,  MD  ?ondansetron (ZOFRAN-ODT) 8 MG disintegrating tablet Take 1 tablet (8 mg total) by mouth every 8 (eight) hours as needed for nausea or vomiting. ?Patient not taking: Reported on 10/02/2021 09/05/21   Jaynee Eagles, PA-C  ?promethazine-dextromethorphan (PROMETHAZINE-DM) 6.25-15 MG/5ML syrup Take 5 mLs by mouth at bedtime as needed for cough. ?Patient not taking: Reported on 10/02/2021 09/05/21   Jaynee Eagles, PA-C  ?fluticasone (FLOVENT HFA) 110 MCG/ACT inhaler Inhale 2 puffs into the lungs 2 (two) times daily. For shortness of breath ?Patient not taking: Reported on 09/26/2020 12/24/16 10/27/20  Lindell Spar I, NP  ?metFORMIN (GLUCOPHAGE) 500 MG tablet Take 1 tablet (500 mg total) by mouth 2 (two) times daily with a meal. ?Patient not taking: No sig reported 01/01/20 10/27/20  Noemi Chapel, MD  ?trazodone (DESYREL) 300 MG tablet Take 300 mg by mouth at bedtime. ?Patient not taking: Reported on 09/26/2020  10/27/20  [provider]  ?   ? ?Allergies    ?Bactrim [sulfamethoxazole-trimethoprim], Hydrocodone-acetaminophen, and Vicodin [hydrocodone-acetaminophen]   ? ?Review of Systems   ?Review of Systems  ?All other systems reviewed and are negative. ? ?Physical Exam ?Updated Vital Signs ?BP (!) 105/55   Pulse (!) 57   Temp 98 ?F (36.7 ?C) (Oral)   Resp 14   Ht 5' 11"  (1.803 m)   Wt 136.1 kg   LMP  (LMP Unknown)   SpO2 92%   BMI 41.84 kg/m?  ?Physical Exam ?Vitals and nursing note reviewed.  ?Constitutional:   ?   Appearance: She is well-developed.  ?HENT:  ?   Head: Atraumatic.  ?Eyes:  ?   Extraocular Movements: Extraocular movements intact.  ?   Pupils: Pupils are equal, round, and reactive to light.  ?Cardiovascular:  ?   Rate and Rhythm: Normal rate.  ?Pulmonary:  ?   Effort: Pulmonary effort is normal.  ?Musculoskeletal:  ?   Cervical back: Normal range of motion and neck supple.  ?Skin: ?   General: Skin is warm and dry.  ?Neurological:  ?   Mental Status: She is alert and oriented to person, place, and  time.  ?   Cranial Nerves: No cranial nerve deficit.  ?   Sensory: No sensory deficit.  ? ? ?ED Results / Procedures / Treatments   ?Labs ?(all labs ordered are listed, but only abnormal results are displayed) ?Labs Reviewed  ?BASIC METABOLIC PANEL - Abnormal; Notable for the following components:  ?    Result Value  ? Glucose, Bld 145 (*)   ? BUN 25 (*)   ? Creatinine, Ser 1.09 (*)   ? All other components within normal limits  ?CBG MONITORING, ED - Abnormal; Notable for the following components:  ? Glucose-Capillary 126 (*)   ? All other components within normal limits  ?CBG MONITORING, ED - Abnormal; Notable for the following components:  ? Glucose-Capillary 105 (*)   ? All other components within normal limits  ?CBG MONITORING, ED - Abnormal;  Notable for the following components:  ? Glucose-Capillary 154 (*)   ? All other components within normal limits  ?CBG MONITORING, ED - Abnormal; Notable for the following components:  ? Glucose-Capillary 146 (*)   ? All other components within normal limits  ?CBC  ? ? ?EKG ?None ? ?Radiology ?DG Ribs Unilateral W/Chest Right ? ?Result Date: 10/02/2021 ?CLINICAL DATA:  Lower right rib pain after fall. EXAM: RIGHT RIBS AND CHEST - 3+ VIEW COMPARISON:  Chest x-ray 08/18/2021. FINDINGS: No fracture or other bone lesions are seen involving the ribs. There is no evidence of pneumothorax or pleural effusion. Both lungs are clear. Heart size and mediastinal contours are within normal limits. IMPRESSION: Negative. Electronically Signed   By: Ronney Asters M.D.   On: 10/02/2021 17:51  ? ?CT HEAD WO CONTRAST (5MM) ? ?Result Date: 10/02/2021 ?CLINICAL DATA:  Multiple witnessed seizures. EXAM: CT HEAD WITHOUT CONTRAST TECHNIQUE: Contiguous axial images were obtained from the base of the skull through the vertex without intravenous contrast. RADIATION DOSE REDUCTION: This exam was performed according to the departmental dose-optimization program which includes automated exposure control,  adjustment of the mA and/or kV according to patient size and/or use of iterative reconstruction technique. COMPARISON:  CT and MRI brain July 31, 2021 FINDINGS: Brain: No evidence of acute infarction, hem

## 2021-10-03 NOTE — Discharge Summary (Signed)
?Physician Discharge Summary ?  ?Patient: Andrea [name] MRN: [id number] DOB: Jul 18, 1980  ?Admit date:     10/02/2021  ?Discharge date: 10/03/21  ?Discharge Physician: Shanon Brow Michelene Keniston  ? ?PCP: [name], NP  ? ?Recommendations at discharge:  ? Please follow up with primary care provider within 1-2 weeks ? Please repeat BMP and CBC in one week ? Please follow up on/with Dr. Ellouise Newer ? ? ? ?Hospital Course: ?41 y.o. female with medical history significant for  recent new onset seizures, DM, Bipolar disorder, schizophrenia.brought to the ED 4/18 after reports of multiple seizures.  Essentially, the patient had 5 episodes of seizure activity in the last 24 hours, with the last episode upon arrival to the emergency department. ?Patient is unaware when these seizures are about to occur.  Spouse reports patient started staring blankly and then starts blinking her eyelids fast and then her whole body upper and lower extremities jerking.  Yesterday for the first time she had an episode of urinary incontinence.  But no bowel incontinence, tongue biting, foaming noted.  Spouse estimates that her seizures usually last about a minute to minute and a half.  After the seizure, she is unresponsive, breathing normally but with eyes closed, this usually lasts about 2 to 2-1/2 minutes. ?At time of my evaluation patient is back to baseline, awake alert oriented x 4 answering questions appropriately.  She reports a headache, and pain on right side of her rib that started today which she thinks she may have hurt during her seizures. ?She had Recent hospitalization 2/14 -2/15 for generalized tonic-clonic seizure, MRI and EEG were both unremarkable, patient gabapentin was increased from 100 to 300 mg 3 times daily.  She subsequently established care with outpatient neurologist Dr. [name] on 08/08/21, and zonisamide was added and titrated up to 300 mg q hs.  ?  ?Patient reports since that visit she had another seizure on March 10.   She has been compliant with both zonisamide and gabapentin.  She denies recent change in medications.  Denies alcohol or illicit drug use.  Denies GI, GU, respiratory symptoms, no fevers..  No recent or unusual stressors.  No known family history of seizures.  Patient completed a 48 -hour ambulatory EEG - 4/10 to 4/12, which was normal. ?I spoke with Dr. [name] later in the day on 10/03/21 and updated her on patient.  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 ?Assessment and Plan: ?* Seizures ([place]) ?Five seizures over the past 24 hours.  ? Last seizure here in ED while waiting to be seen.   ?No seizure witnessed by hospital staff.  Currently back to baseline.   ?Recent hospitalization for same 07/2021, with normal MRI and routine EEG.  Gabapentin dose was increased from 100-300, zonisamide was started as an outpatient by her outpatient neurologist.  She reports compliance with both. ?-Per Care Everywhere 08/09/2016 ,  patient was evaluated at [facility] by neurology for right sided tingling, slurred speech, right hemibody weakness, and spells.  Also documented- boyfriend reported onset of seizures that started October 2017.  A spell was captured during routine EEG and was nonepileptic.  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 (Please see notes for full details). ?- EDP contacted patients outpatient neurologist, Dr. [name], recommended admission to [facility] for continuous EEG monitoring ?-Seizure precautions ?-Continue zonisamide and gabapentin ?-Obtain head CT--neg for acute findings ?-Complaints of  right sided pain, obtain right rib xray--neg ?10/03/21--I spoke with Dr. [name] and updated her on patient.  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 ? ?Bipolar I disorder, most recent episode depressed ([place]) ?Resume doxepin, olanzapine, fluoxetine. ? ?HTN  (hypertension) ?Stable. ?-Resume metoprolol ?Holding lisino/HCTZ due to soft BP overnight>>restart after d/c ? ?Uncontrolled type 2 diabetes mellitus with hyperglycemia, with long-term current use of insulin ([place]) ?A1c 7.5. Controlled.  She has continuous glucose monitoring which will have to be removed so that head CT and x-rays can be done, she was initially hesitant to remove it. ?- SSI- M ?-Hold Jardiance, Rybelsus while inpatient ?-Resume [name]  ? ?Obesity, Class III, BMI 40-49.9 (morbid obesity) ([place]) ?BMI 41.89 ?Lifestyle modification ? ? ? ? ?  ? ? ?Consultants: neurology ?Procedures performed: none  ?Disposition: Home ?Diet recommendation:  ?Cardiac and Carb modified diet ?DISCHARGE MEDICATION: ?Allergies as of 10/03/2021   ? ?   Reactions  ? Bactrim [sulfamethoxazole-trimethoprim] Hives, Itching  ? Hydrocodone-acetaminophen Itching  ? Vicodin [hydrocodone-acetaminophen] Hives, Itching  ? ?  ? ?  ?Medication List  ?  ? ?STOP taking these medications   ? ?benzonatate 100 MG capsule ?Commonly known as: TESSALON ?  ?BinaxNOW COVID-19 Ag Home Test Kit ?Generic drug: COVID-19 At Home Antigen Test ?  ?ibuprofen 800 MG tablet ?Commonly known as: ADVIL ?  ?ondansetron 8 MG disintegrating tablet ?Commonly known as: ZOFRAN-ODT ?  ?promethazine-dextromethorphan 6.25-15 MG/5ML syrup ?Commonly known as: PROMETHAZINE-DM ?  ? ?  ? ?TAKE these medications   ? ?albuterol 108 (90 Base) MCG/ACT inhaler ?Commonly known as: VENTOLIN HFA ?Inhale 1-2 puffs into the lungs every 6 (six) hours as needed for wheezing or shortness of breath. ?  ?cetirizine 10 MG tablet ?Commonly known as: ZyrTEC Allergy ?Take 1 tablet (10 mg total) by mouth daily. ?  ?doxepin 50 MG capsule ?Commonly known as: SINEQUAN ?Take 50 mg by mouth at bedtime. ?  ?FLUoxetine 20 MG capsule ?Commonly known as: PROzac ?Take 1 capsule (20 mg total) by mouth daily. ?What changed: how much to take ?  ?gabapentin 300 MG capsule ?Commonly known as: NEURONTIN ?Take 1  capsule (300 mg total) by mouth 3 (three) times daily. ?  ?HYDROcodone-acetaminophen 7.5-325 MG tablet ?Commonly known as: NORCO ?Take 1 tablet by mouth every 6 (six) hours as needed. ?  ?Jardiance 25 MG Tabs tablet ?Generic drug: empagliflozin ?Take 25 mg by mouth daily. ?  ?lisinopril-hydrochlorothiazide 20-25 MG tablet ?Commonly known as: ZESTORETIC ?Take 1 tablet by mouth daily. ?  ?meloxicam 15 MG tablet ?Commonly known as: MOBIC ?Take 15 mg by mouth daily. ?  ?metoprolol succinate 25 MG 24 hr tablet ?Commonly known as: TOPROL-XL ?Take 1 tablet by mouth daily. ?  ?OLANZapine 10 MG tablet ?Commonly known as: ZYPREXA ?Take 10 mg by mouth in the morning and at bedtime. ?  ?oxybutynin 5 MG 24 hr tablet ?Commonly known as: DITROPAN-XL ?Take one tablet twice daily ?  ?pantoprazole 40 MG tablet ?Commonly known as: PROTONIX ?Take 1 tablet by mouth daily. ?  ?Rybelsus 14 MG Tabs ?Generic drug: Semaglutide ?Take 1 tablet by mouth daily. ?  ?tiZANidine 4 MG tablet ?Commonly known as: ZANAFLEX ?Take 4 mg by mouth every 8 (eight) hours as needed for muscle spasms. ?  ?[name] FlexTouch 100 UNIT/ML FlexTouch Pen ?Generic drug: insulin degludec ?Inject 20 Units into the skin daily. ?  ?zonisamide 100 MG capsule ?Commonly known as:  ZONEGRAN ?Take 1 capsule every night for 2 weeks, then increase to 2 capsules every night for 2 weeks, then increase to 3 capsules every night and continue ?What changed:  ?how much to take ?how to take this ?when to take this ?additional instructions ?  ? ?  ? ? ?Discharge Exam: ?Filed Weights  ? 10/02/21 1315  ?Weight: 136.1 kg  ? ?HEENT:  [place]/AT, No thrush, no icterus ?CV:  RRR, no rub, no S3, no S4 ?Lung:  CTA, no wheeze, no rhonchi ?Abd:  soft/+BS, NT ?Ext:  No edema, no lymphangitis, no synovitis, no rash ? ? ?Condition at discharge: stable ? ?The results of significant diagnostics from this hospitalization (including imaging, microbiology, ancillary and laboratory) are listed below for  reference.  ? ?Imaging Studies: ?DG Ribs Unilateral W/Chest Right ? ?Result Date: 10/02/2021 ?CLINICAL DATA:  Lower right rib pain after fall. EXAM: RIGHT RIBS AND CHEST - 3+ VIEW COMPARISON:  Chest x-ray 08/18/2021. FIND

## 2021-10-03 NOTE — Assessment & Plan Note (Signed)
BMI 41.89 ?Lifestyle modification ?

## 2021-10-04 ENCOUNTER — Telehealth: Payer: Self-pay | Admitting: Neurology

## 2021-10-04 NOTE — Telephone Encounter (Signed)
Patient called and stated she was in ED on 4/18.  She wanted to get a sooner appointment.  She is scheduled for 5/4 from the ED referral. ?

## 2021-10-04 NOTE — Telephone Encounter (Signed)
Pls let her know that is the earliest available at this time, but that she is on the cancellation list, thanks ?

## 2021-10-18 ENCOUNTER — Ambulatory Visit (INDEPENDENT_AMBULATORY_CARE_PROVIDER_SITE_OTHER): Payer: No Typology Code available for payment source | Admitting: Neurology

## 2021-10-18 ENCOUNTER — Encounter: Payer: Self-pay | Admitting: Neurology

## 2021-10-18 VITALS — BP 99/65 | HR 62 | Ht 71.0 in | Wt 307.0 lb

## 2021-10-18 DIAGNOSIS — G4440 Drug-induced headache, not elsewhere classified, not intractable: Secondary | ICD-10-CM | POA: Diagnosis not present

## 2021-10-18 DIAGNOSIS — R569 Unspecified convulsions: Secondary | ICD-10-CM

## 2021-10-18 MED ORDER — ZONISAMIDE 100 MG PO CAPS: ORAL_CAPSULE | ORAL | 6 refills | Status: DC

## 2021-10-18 NOTE — Progress Notes (Signed)
? ?NEUROLOGY FOLLOW UP OFFICE NOTE ? ?Andrea [name] ?[id number] ?May 20, 1981 ? ?HISTORY OF PRESENT ILLNESS: ?I had the pleasure of seeing Andrea [name] in follow-up in the neurology clinic on 10/18/2021.  The patient was last seen 2 months ago for new onset seizures. She is again accompanied by her husband who helps supplement the history today.  Records and images were personally reviewed where available.  Her ambulatory 48-hour EEG done last month was normal. She was started on Zonisamide in February for seizure and headache prophylaxis, currently on '300mg'$  qhs with no side effects. She was in the ER on 10/02/20 when she started having episodes the night prior then at work had 2 or 3 more. She would stare blankly then start blinking fast followed by body jerking for 1-1.5 minutes. She had urinary incontinence with one of them. While in the ER, she had 2 episodes however her husband states he was unable to get staff to witness them. She was observed overnight while awaiting EMU bed with no further events, and since there was no bed availability, she was discharged home. She was back at [facility] on 10/04/21 when she had another staring episode with eye twitching followed this time by left facial droop. She was confused. She was evaluated by Neurology with an NIHSS of 5 with "smile asymmetry, LUE/LLE mild drift, L hemibody decreased sensation, and neglect of R side with functional facial asymmetry. Her symptoms resolved to NIH 0 within approximately 20 minutes." Functional component was suspected and patient was discharged home. They deny any further spells since then. She continues to have frequent headaches, taking 2-3 Tylenol daily. The left side of her face is intermittently numb, there is some weakness of her left arm. She had seen her eye doctor because the left eye is blurred, she will be seeing a retina specialist. Sleep is good.  ? ? ?History on Initial Assessment 08/08/2021: This is a pleasant 41 year old  right-handed woman with a history of hypertension, DM, migraines, bipolar disorder, presenting for evaluation of new onset seizures. She is accompanied by her husband who helps supplement the history today. She was in her usual state of health until 07/31/21 while at work at [employer], she started feeling a little funny and sat down, then woke up in the hospital. Co-workers reported she got went, went "pale white," then slid down to the floor and had a 4-minute episode of jerking with EMS noting post-ictal confusion. No tongue bite or incontinence. In the ER, her husband reports she had 2 more seizures, but ER notes indicate there was an episode of upper body jerking including head jerking lasting 15-20 seconds. Bloodwork showed creatinine 1.21, glucose 265. I personally reviewed MRI brain with and without contrast which was normal, hippocampi symmetric with no abnormal signal or enhancement seen. Her wake and drowsy EEG was normal. She had been on Gabapentin for anxiety, dose was increased to '300mg'$  TID. She feels her left side has been weaker since then, this has gotten better. Since hospital discharge, her husband reports more seizures. She had an unwitnessed one on 2/16 where she recalls going to the bathroom and coming out, then waking up on the ground, no injuries/tongue bite/incontinence. There was no prior warning. On 2/17, after eating a snack, she told her husband she felt funny and lay down, she then had another shaking episode lasting less than a minute. The last episode was on 2/18, they were dozing in bed, he felt her shaking and saw her lying  on her left side. He thinks her eyes were closed, mouth open, it lasted less than 30 seconds, she woke up, then had another 30-second shaking episode. She had no memory of it and told her husband her head hurt and she felt very tired. Her husband denies any staring episodes. She denies any olfactory/gustatory hallucinations, deja vu, rising epigastric sensation, focal  numbness/tingling. She has occasional jerks in her legs. She used to have migraines that quieted down. She used to take Topamax but it interacted with one of her medications. Since the initial seizure, she has had frontal throbbing headaches that recur, different from past headaches. She is sensitive to lights/sounds, no nausea/vomiting. Tylenol helps sometimes but lately has not, she has been taking 2 '650mg'$  tabs three times a day for the past week. She denies any diplopia, dysarthria/dysphagia, neck/back pain, bowel/bladder dysfunction. Her husband tried to check her glucose level with the last seizure, it was 271. She denies any sleep deprivation, she gets 6-8 hours of sleep with Doxepin. No recent change in stress levels. She is on Zyprexa for mood, mood is good. She is not having a lot of anxiety anymore. She lives with her husband and works as a Retail banker at [employer]. No alcohol use. She had a normal birth and early development.  There is no history of febrile convulsions, CNS infections such as meningitis/encephalitis, significant traumatic brain injury, neurosurgical procedures, or family history of seizures. ? ? ?PAST MEDICAL HISTORY: ?Past Medical History:  ?Diagnosis Date  ? Anxiety   ? Bipolar 1 disorder ([place])   ? Complication of anesthesia   ? hard time waking up   ? Depression   ? Diabetes mellitus without complication ([place])   ? GERD (gastroesophageal reflux disease)   ? no meds  ? Kidney stones   ? Low back pain   ? Migraines   ? PCOS (polycystic ovarian syndrome)   ? PONV (postoperative nausea and vomiting)   ? Schizophrenia ([place])   ? Shortness of breath dyspnea   ? with bronchitis  ? ? ?MEDICATIONS: ?Current Outpatient Medications on File Prior to Visit  ?Medication Sig Dispense Refill  ? albuterol (VENTOLIN HFA) 108 (90 Base) MCG/ACT inhaler Inhale 1-2 puffs into the lungs every 6 (six) hours as needed for wheezing or shortness of breath. 18 g 0  ? cetirizine (ZYRTEC ALLERGY) 10 MG  tablet Take 1 tablet (10 mg total) by mouth daily. 30 tablet 0  ? doxepin (SINEQUAN) 50 MG capsule Take 50 mg by mouth at bedtime.    ? FLUoxetine (PROZAC) 20 MG capsule Take 1 capsule (20 mg total) by mouth daily. (Patient taking differently: Take 60 mg by mouth daily.) 90 capsule 0  ? gabapentin (NEURONTIN) 300 MG capsule Take 1 capsule (300 mg total) by mouth 3 (three) times daily. 90 capsule 0  ? HYDROcodone-acetaminophen (NORCO) 7.5-325 MG tablet Take 1 tablet by mouth every 6 (six) hours as needed.    ? JARDIANCE 25 MG TABS tablet Take 25 mg by mouth daily.    ? lisinopril-hydrochlorothiazide (ZESTORETIC) 20-25 MG tablet Take 1 tablet by mouth daily.    ? meloxicam (MOBIC) 15 MG tablet Take 15 mg by mouth daily.    ? metoprolol succinate (TOPROL-XL) 25 MG 24 hr tablet Take 1 tablet by mouth daily.    ? OLANZapine (ZYPREXA) 10 MG tablet Take 10 mg by mouth in the morning and at bedtime.    ? oxybutynin (DITROPAN-XL) 5 MG 24 hr tablet Take  one tablet twice daily 60 tablet 2  ? pantoprazole (PROTONIX) 40 MG tablet Take 1 tablet by mouth daily.    ? RYBELSUS 14 MG TABS Take 1 tablet by mouth daily.    ? tiZANidine (ZANAFLEX) 4 MG tablet Take 4 mg by mouth every 8 (eight) hours as needed for muscle spasms.    ? TRESIBA FLEXTOUCH 100 UNIT/ML FlexTouch Pen Inject 20 Units into the skin daily.    ? zonisamide (ZONEGRAN) 100 MG capsule Take 1 capsule every night for 2 weeks, then increase to 2 capsules every night for 2 weeks, then increase to 3 capsules every night and continue (Patient taking differently: Take 300 mg by mouth at bedtime.) 90 capsule 6  ? [DISCONTINUED] fluticasone (FLOVENT HFA) 110 MCG/ACT inhaler Inhale 2 puffs into the lungs 2 (two) times daily. For shortness of breath (Patient not taking: Reported on 09/26/2020) 3 Inhaler 0  ? [DISCONTINUED] metFORMIN (GLUCOPHAGE) 500 MG tablet Take 1 tablet (500 mg total) by mouth 2 (two) times daily with a meal. (Patient not taking: No sig reported) 60 tablet 1   ? [DISCONTINUED] trazodone (DESYREL) 300 MG tablet Take 300 mg by mouth at bedtime. (Patient not taking: Reported on 09/26/2020)    ? ?No current facility-administered medications on file prior to visit.  ? ? ?A

## 2021-10-18 NOTE — Patient Instructions (Signed)
Good to see you. ? ?We will get you set up for the inpatient video EEG study ? ?2. Increase Zonisamide '100mg'$ : Take 4 capsules every night ? ?3. Start weaning down Tylenol/over the counter pain medication to 2-3 times a week ? ?4. Follow-up after video EEG, call for any changes ? ? ?Seizure Precautions: ?1. If medication has been prescribed for you to prevent seizures, take it exactly as directed.  Do not stop taking the medicine without talking to your doctor first, even if you have not had a seizure in a long time.  ? ?2. Avoid activities in which a seizure would cause danger to yourself or to others.  Don't operate dangerous machinery, swim alone, or climb in high or dangerous places, such as on ladders, roofs, or girders.  Do not drive unless your doctor says you may. ? ?3. If you have any warning that you may have a seizure, lay down in a safe place where you can't hurt yourself.   ? ?4.  No driving for 6 months from last seizure, as per [facility].   Please refer to the following link on the [facility] website for more information: http://www.epilepsyfoundation.org/answerplace/Social/driving/drivingu.cfm  ? ?5.  Maintain good sleep hygiene. ? ?6.  Notify your neurology if you are planning pregnancy or if you become pregnant. ? ?7.  Contact your doctor if you have any problems that may be related to the medicine you are taking. ? ?8.  Call 911 and bring the patient back to the ED if: ?      ? A.  The seizure lasts longer than 5 minutes.      ? B.  The patient doesn't awaken shortly after the seizure ? C.  The patient has new problems such as difficulty seeing, speaking or moving ? D.  The patient was injured during the seizure ? E.  The patient has a temperature over 102 F (39C) ? F.  The patient vomited and now is having trouble breathing ?      ? ?

## 2021-10-19 IMAGING — DX DG ANKLE COMPLETE 3+V*L*
3 series · 3 of 3 positions shown · non-contrast
Comparison: None.

CLINICAL DATA: Pain. Fall last night with left ankle pain and
swelling.

EXAM:
LEFT ANKLE COMPLETE - 3+ VIEW

[ankle ap]
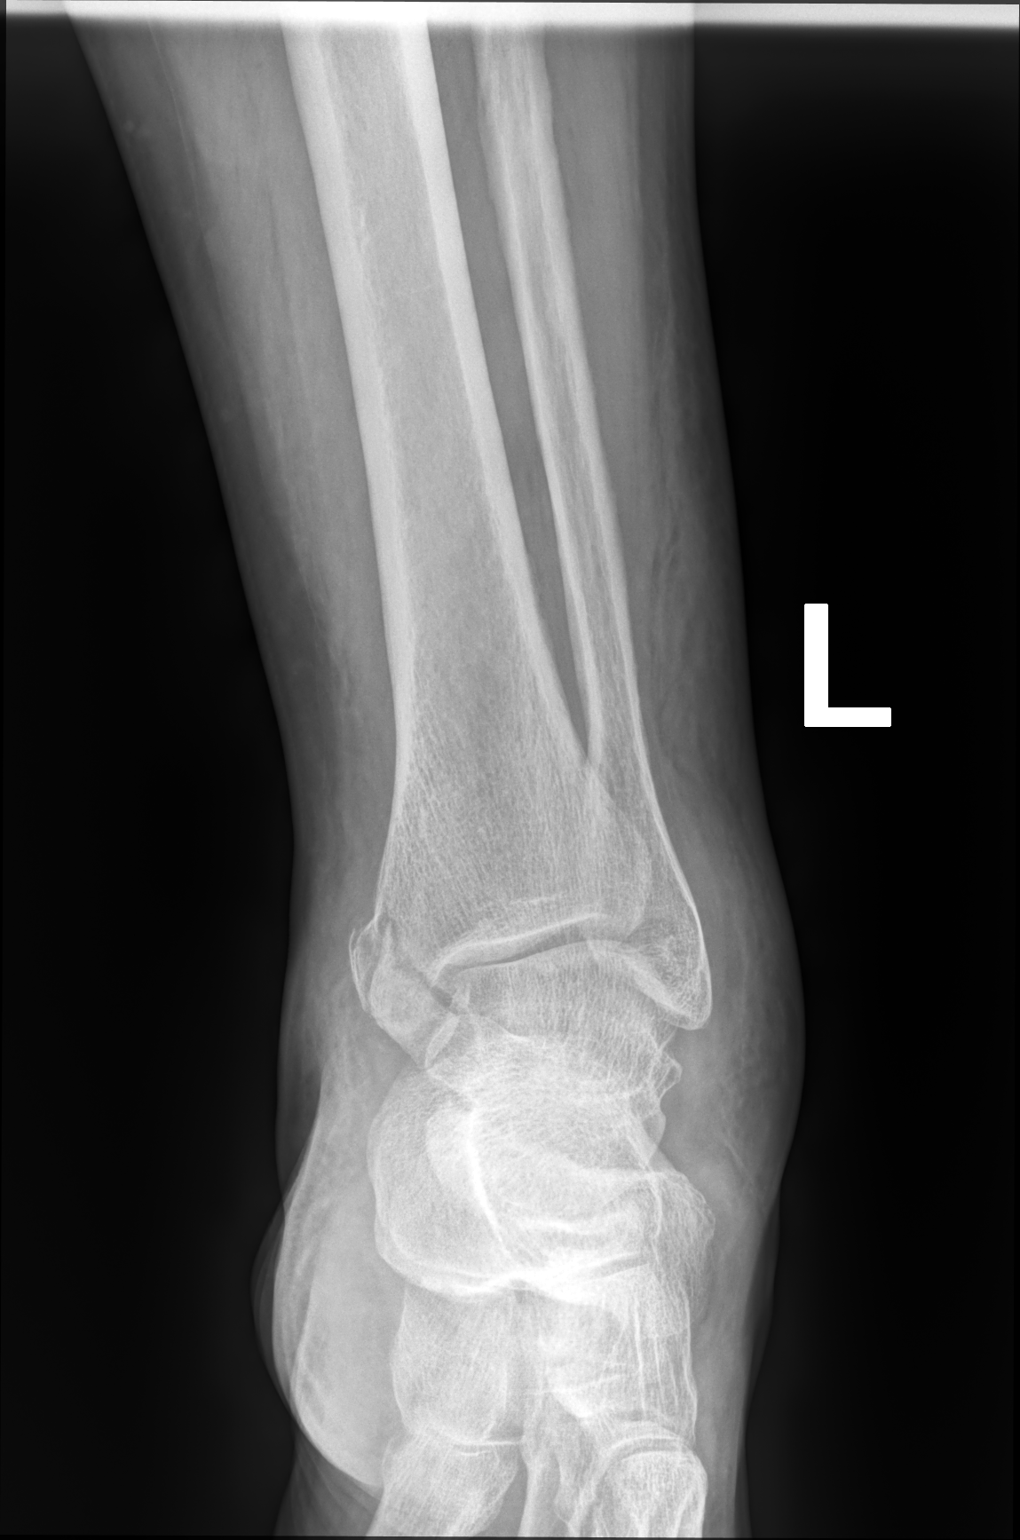

[ankle mlo]
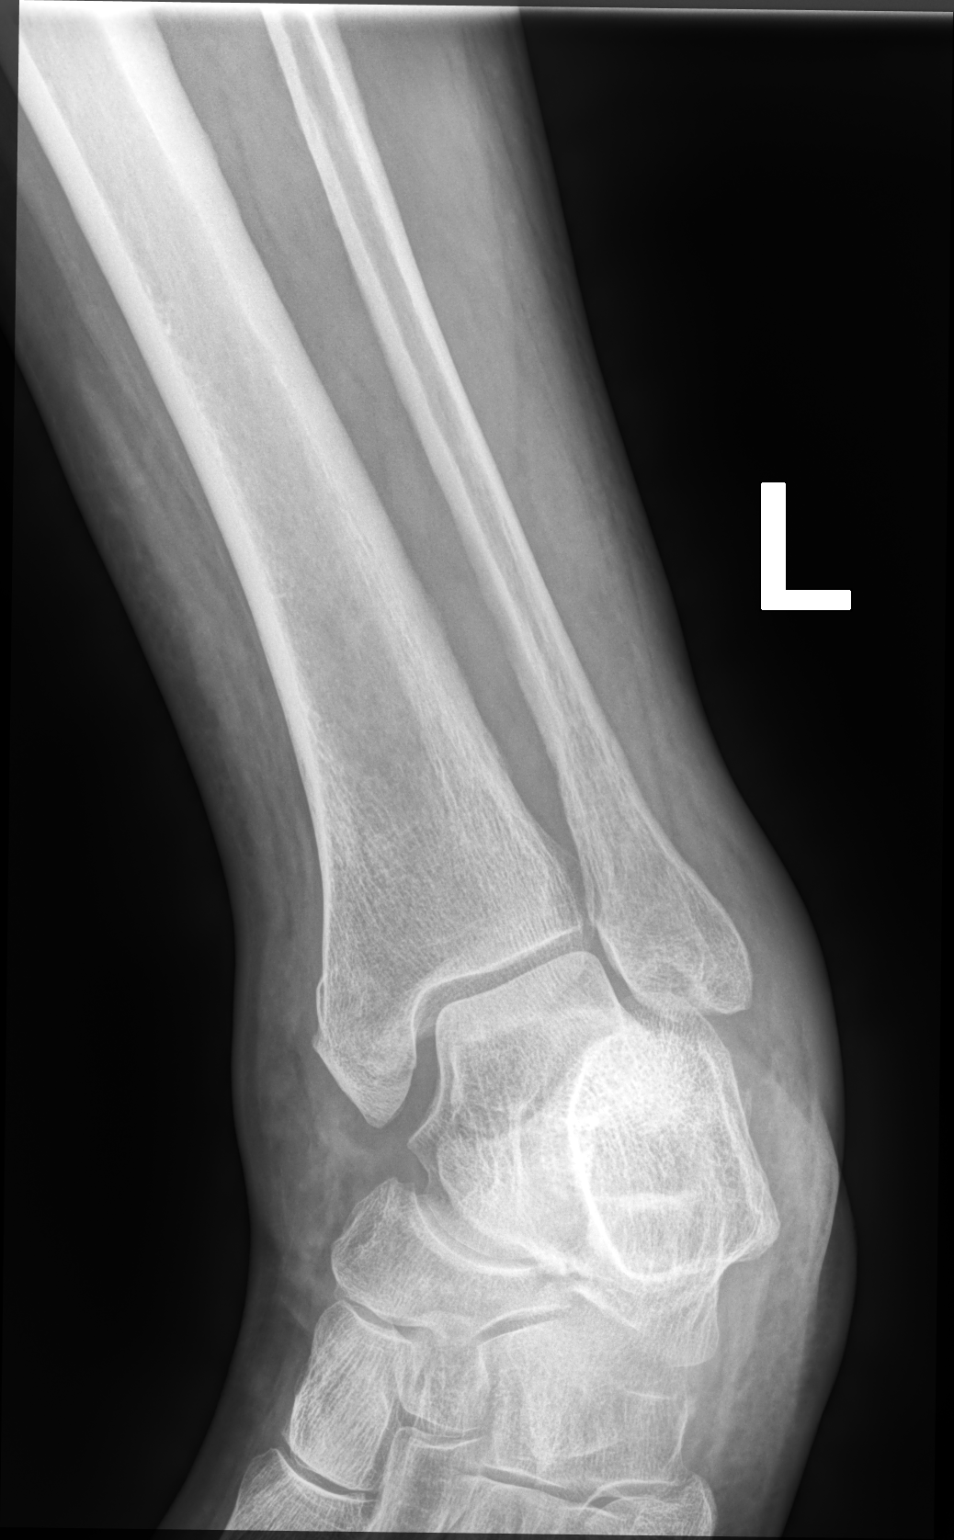

[ankle lat]
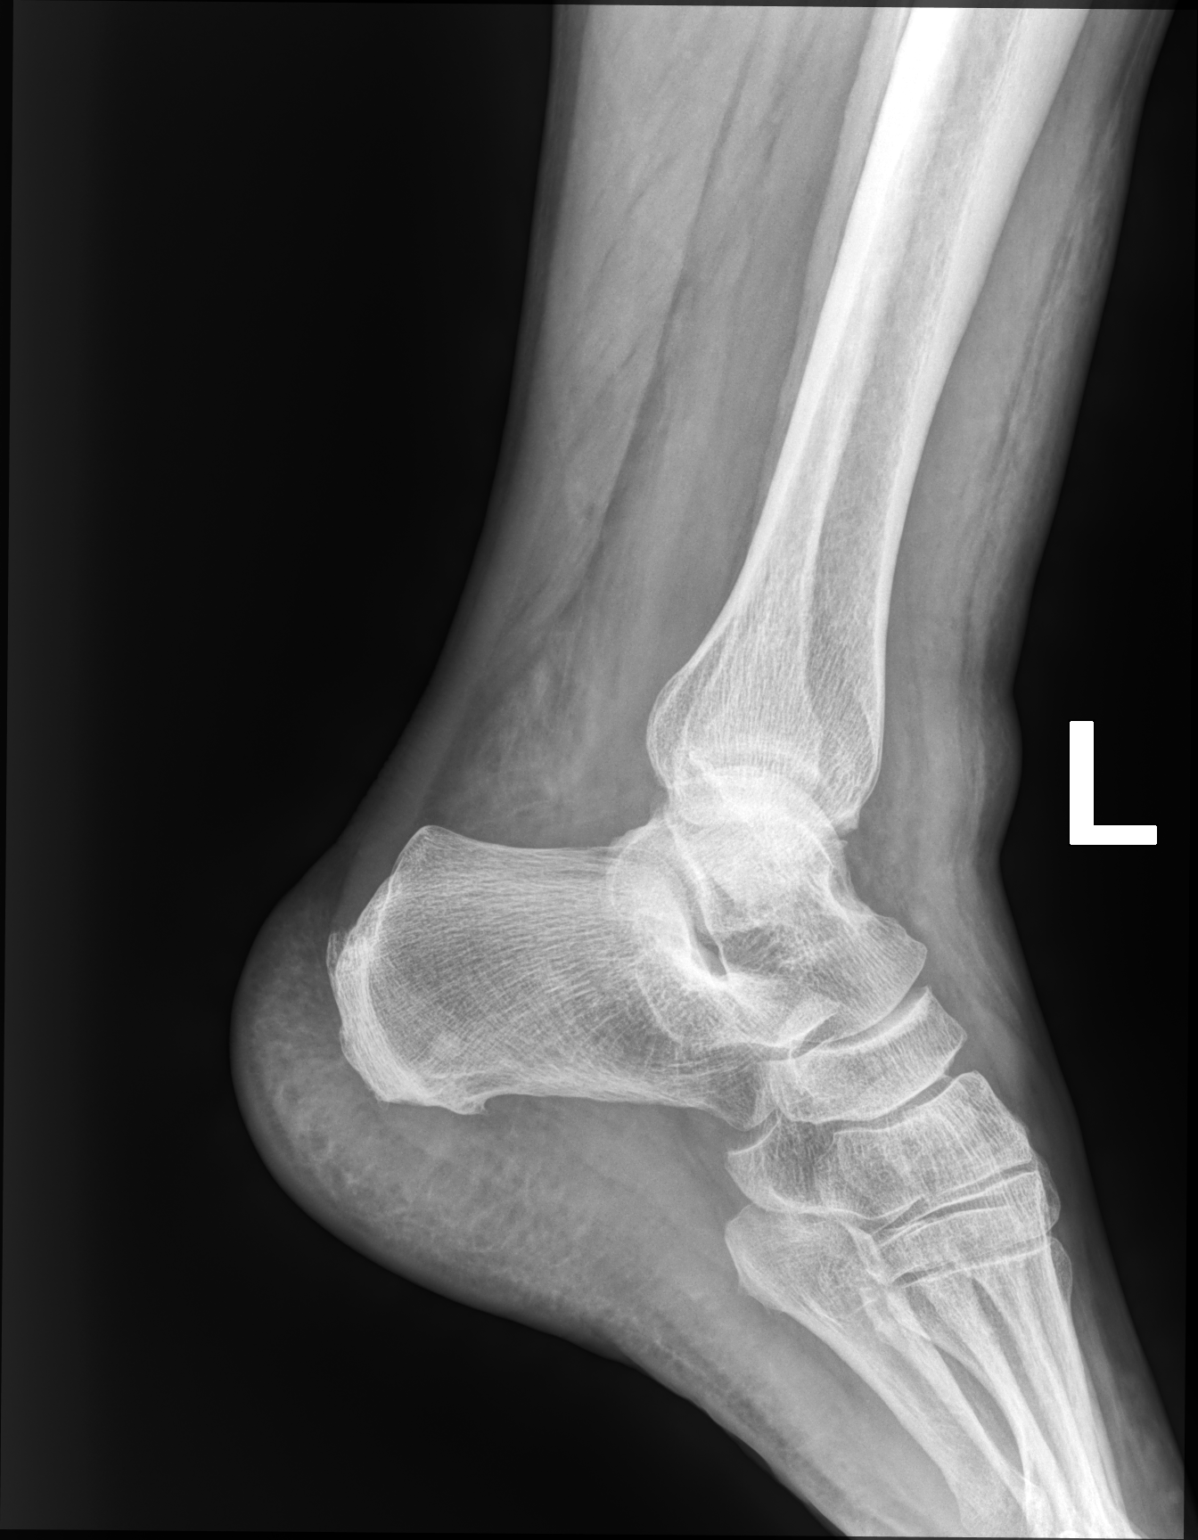

[3 of 3 positions shown; findings below may reference images not displayed]

FINDINGS: Mildly comminuted minimally displaced primarily oblique fracture
through the distal medial tibia and medial malleolus. There is a
questionable oblique nondisplaced distal fibular fracture. No
evidence of posterior tibial tubercle fracture. No ankle joint
effusion. No mortise widening. Tiny plantar calcaneal spur and
Achilles tendon enthesophyte. Generalized soft tissue edema.
IMPRESSION: Mildly comminuted minimally displaced distal medial tibial/medial
malleolar fracture. Questionable nondisplaced distal fibular
fracture.

## 2021-11-19 ENCOUNTER — Encounter (INDEPENDENT_AMBULATORY_CARE_PROVIDER_SITE_OTHER): Payer: No Typology Code available for payment source | Admitting: Ophthalmology

## 2021-11-19 DIAGNOSIS — H43813 Vitreous degeneration, bilateral: Secondary | ICD-10-CM

## 2021-11-19 DIAGNOSIS — H35033 Hypertensive retinopathy, bilateral: Secondary | ICD-10-CM

## 2021-11-19 DIAGNOSIS — I10 Essential (primary) hypertension: Secondary | ICD-10-CM | POA: Diagnosis not present

## 2021-11-19 DIAGNOSIS — D3131 Benign neoplasm of right choroid: Secondary | ICD-10-CM | POA: Diagnosis not present

## 2021-11-19 DIAGNOSIS — E113291 Type 2 diabetes mellitus with mild nonproliferative diabetic retinopathy without macular edema, right eye: Secondary | ICD-10-CM | POA: Diagnosis not present

## 2021-11-26 ENCOUNTER — Inpatient Hospital Stay (HOSPITAL_COMMUNITY): Payer: No Typology Code available for payment source

## 2021-11-26 ENCOUNTER — Inpatient Hospital Stay (HOSPITAL_COMMUNITY)
Admission: RE | Admit: 2021-11-26 | Discharge: 2021-11-30 | DRG: 101 | Disposition: A | Discharge status: Home / Self Care | Payer: No Typology Code available for payment source | Source: Ambulatory Visit | Attending: Neurology | Admitting: Neurology

## 2021-11-26 DIAGNOSIS — R569 Unspecified convulsions: Principal | ICD-10-CM

## 2021-11-26 DIAGNOSIS — G43909 Migraine, unspecified, not intractable, without status migrainosus: Secondary | ICD-10-CM | POA: Diagnosis present

## 2021-11-26 DIAGNOSIS — Z79899 Other long term (current) drug therapy: Secondary | ICD-10-CM

## 2021-11-26 DIAGNOSIS — Z7984 Long term (current) use of oral hypoglycemic drugs: Secondary | ICD-10-CM | POA: Diagnosis not present

## 2021-11-26 DIAGNOSIS — K219 Gastro-esophageal reflux disease without esophagitis: Secondary | ICD-10-CM | POA: Diagnosis present

## 2021-11-26 DIAGNOSIS — E1159 Type 2 diabetes mellitus with other circulatory complications: Secondary | ICD-10-CM

## 2021-11-26 DIAGNOSIS — F319 Bipolar disorder, unspecified: Secondary | ICD-10-CM | POA: Diagnosis present

## 2021-11-26 DIAGNOSIS — E1169 Type 2 diabetes mellitus with other specified complication: Secondary | ICD-10-CM

## 2021-11-26 DIAGNOSIS — E876 Hypokalemia: Secondary | ICD-10-CM | POA: Diagnosis present

## 2021-11-26 DIAGNOSIS — I10 Essential (primary) hypertension: Secondary | ICD-10-CM | POA: Diagnosis present

## 2021-11-26 DIAGNOSIS — Z6841 Body Mass Index (BMI) 40.0 and over, adult: Secondary | ICD-10-CM

## 2021-11-26 DIAGNOSIS — Z791 Long term (current) use of non-steroidal anti-inflammatories (NSAID): Secondary | ICD-10-CM | POA: Diagnosis not present

## 2021-11-26 DIAGNOSIS — G894 Chronic pain syndrome: Secondary | ICD-10-CM | POA: Diagnosis not present

## 2021-11-26 DIAGNOSIS — Z882 Allergy status to sulfonamides status: Secondary | ICD-10-CM

## 2021-11-26 DIAGNOSIS — Z7985 Long-term (current) use of injectable non-insulin antidiabetic drugs: Secondary | ICD-10-CM

## 2021-11-26 DIAGNOSIS — E1110 Type 2 diabetes mellitus with ketoacidosis without coma: Secondary | ICD-10-CM | POA: Diagnosis not present

## 2021-11-26 DIAGNOSIS — J45909 Unspecified asthma, uncomplicated: Secondary | ICD-10-CM | POA: Diagnosis present

## 2021-11-26 DIAGNOSIS — E669 Obesity, unspecified: Secondary | ICD-10-CM | POA: Diagnosis present

## 2021-11-26 DIAGNOSIS — I152 Hypertension secondary to endocrine disorders: Secondary | ICD-10-CM | POA: Diagnosis not present

## 2021-11-26 DIAGNOSIS — R112 Nausea with vomiting, unspecified: Secondary | ICD-10-CM | POA: Diagnosis not present

## 2021-11-26 DIAGNOSIS — Z885 Allergy status to narcotic agent status: Secondary | ICD-10-CM

## 2021-11-26 DIAGNOSIS — E119 Type 2 diabetes mellitus without complications: Secondary | ICD-10-CM | POA: Diagnosis present

## 2021-11-26 DIAGNOSIS — T7840XS Allergy, unspecified, sequela: Secondary | ICD-10-CM | POA: Diagnosis not present

## 2021-11-26 DIAGNOSIS — G8929 Other chronic pain: Secondary | ICD-10-CM | POA: Diagnosis present

## 2021-11-26 LAB — MAGNESIUM: Magnesium: 2.1 mg/dL (ref 1.7–2.4)

## 2021-11-26 LAB — CBC WITH DIFFERENTIAL/PLATELET
Abs Immature Granulocytes: 0.05 10*3/uL (ref 0.00–0.07)
Basophils Absolute: 0 10*3/uL (ref 0.0–0.1)
Basophils Relative: 0 %
Eosinophils Absolute: 0.3 10*3/uL (ref 0.0–0.5)
Eosinophils Relative: 4 %
HCT: 36.4 % (ref 36.0–46.0)
Hemoglobin: 11.8 g/dL — ABNORMAL LOW (ref 12.0–15.0)
Immature Granulocytes: 1 %
Lymphocytes Relative: 31 %
Lymphs Abs: 2 10*3/uL (ref 0.7–4.0)
MCH: 30.3 pg (ref 26.0–34.0)
MCHC: 32.4 g/dL (ref 30.0–36.0)
MCV: 93.6 fL (ref 80.0–100.0)
Monocytes Absolute: 0.4 10*3/uL (ref 0.1–1.0)
Monocytes Relative: 6 %
Neutro Abs: 3.8 10*3/uL (ref 1.7–7.7)
Neutrophils Relative %: 58 %
Platelets: 193 10*3/uL (ref 150–400)
RBC: 3.89 MIL/uL (ref 3.87–5.11)
RDW: 13 % (ref 11.5–15.5)
WBC: 6.5 10*3/uL (ref 4.0–10.5)
nRBC: 0 % (ref 0.0–0.2)

## 2021-11-26 LAB — COMPREHENSIVE METABOLIC PANEL
ALT: 42 U/L (ref 0–44)
AST: 45 U/L — ABNORMAL HIGH (ref 15–41)
Albumin: 3.8 g/dL (ref 3.5–5.0)
Alkaline Phosphatase: 88 U/L (ref 38–126)
Anion gap: 11 (ref 5–15)
BUN: 22 mg/dL — ABNORMAL HIGH (ref 6–20)
CO2: 26 mmol/L (ref 22–32)
Calcium: 9.2 mg/dL (ref 8.9–10.3)
Chloride: 103 mmol/L (ref 98–111)
Creatinine, Ser: 1.41 mg/dL — ABNORMAL HIGH (ref 0.44–1.00)
GFR, Estimated: 48 mL/min — ABNORMAL LOW (ref >60)
Glucose, Bld: 181 mg/dL — ABNORMAL HIGH (ref 70–99)
Potassium: 2.7 mmol/L — CL (ref 3.5–5.1)
Sodium: 140 mmol/L (ref 135–145)
Total Bilirubin: 0.5 mg/dL (ref 0.3–1.2)
Total Protein: 6.7 g/dL (ref 6.5–8.1)

## 2021-11-26 LAB — GLUCOSE, CAPILLARY
Glucose-Capillary: 128 mg/dL — ABNORMAL HIGH (ref 70–99)
Glucose-Capillary: 177 mg/dL — ABNORMAL HIGH (ref 70–99)
Glucose-Capillary: 144 mg/dL — ABNORMAL HIGH (ref 70–99)

## 2021-11-26 LAB — PROTIME-INR
INR: 1.1 (ref 0.8–1.2)
Prothrombin Time: 14.4 seconds (ref 11.4–15.2)

## 2021-11-26 LAB — PHOSPHORUS: Phosphorus: 4.5 mg/dL (ref 2.5–4.6)

## 2021-11-26 MED ORDER — HOME MED STORE IN PYXIS
1.0000 | Freq: Every day | Status: DC
Start: 2021-11-27 — End: 2021-11-26

## 2021-11-26 MED ORDER — EMPAGLIFLOZIN 25 MG PO TABS
25.0000 mg | ORAL_TABLET | Freq: Every day | ORAL | Status: DC
  Administered 2021-11-27 – 2021-11-30 (×4): 25 mg via ORAL
  Filled 2021-11-26 (×4): qty 1

## 2021-11-26 MED ORDER — LABETALOL HCL 5 MG/ML IV SOLN: 5.0000 mg | INTRAVENOUS | Status: DC | PRN

## 2021-11-26 MED ORDER — INSULIN GLARGINE-YFGN 100 UNIT/ML ~~LOC~~ SOLN
20.0000 [IU] | Freq: Two times a day (BID) | SUBCUTANEOUS | Status: DC
  Administered 2021-11-26 – 2021-11-30 (×8): 20 [IU] via SUBCUTANEOUS
  Filled 2021-11-26 (×9): qty 0.2

## 2021-11-26 MED ORDER — LORAZEPAM 2 MG/ML IJ SOLN: 2.0000 mg | INTRAMUSCULAR | Status: DC | PRN

## 2021-11-26 MED ORDER — OXYBUTYNIN CHLORIDE 5 MG/5ML PO SYRP
5.0000 mg | ORAL_SOLUTION | Freq: Two times a day (BID) | ORAL | Status: DC
  Administered 2021-11-26 – 2021-11-28 (×5): 5 mg via ORAL
  Filled 2021-11-26 (×6): qty 5

## 2021-11-26 MED ORDER — HYDROCHLOROTHIAZIDE 25 MG PO TABS
25.0000 mg | ORAL_TABLET | Freq: Every day | ORAL | Status: DC
  Administered 2021-11-27 – 2021-11-30 (×2): 25 mg via ORAL
  Filled 2021-11-26 (×3): qty 1

## 2021-11-26 MED ORDER — METOPROLOL SUCCINATE ER 25 MG PO TB24
25.0000 mg | ORAL_TABLET | Freq: Every day | ORAL | Status: DC
  Administered 2021-11-27 – 2021-11-30 (×2): 25 mg via ORAL
  Filled 2021-11-26 (×3): qty 1

## 2021-11-26 MED ORDER — TIZANIDINE HCL 4 MG PO TABS
4.0000 mg | ORAL_TABLET | Freq: Three times a day (TID) | ORAL | Status: DC | PRN
Start: 2021-11-26 — End: 2021-11-30
  Administered 2021-11-27: 4 mg via ORAL
  Filled 2021-11-26: qty 1

## 2021-11-26 MED ORDER — HOME MED STORE IN PYXIS
1.0000 | Freq: Four times a day (QID) | Status: DC
Start: 2021-11-26 — End: 2021-11-26

## 2021-11-26 MED ORDER — FLUOXETINE HCL 20 MG PO CAPS
60.0000 mg | ORAL_CAPSULE | Freq: Every day | ORAL | Status: DC
  Administered 2021-11-27 – 2021-11-30 (×4): 60 mg via ORAL
  Filled 2021-11-26 (×4): qty 3

## 2021-11-26 MED ORDER — ENOXAPARIN SODIUM 40 MG/0.4ML IJ SOSY
40.0000 mg | PREFILLED_SYRINGE | INTRAMUSCULAR | Status: DC
  Administered 2021-11-26 – 2021-11-27 (×2): 40 mg via SUBCUTANEOUS
  Filled 2021-11-26 (×2): qty 0.4

## 2021-11-26 MED ORDER — IBUPROFEN 200 MG PO TABS
600.0000 mg | ORAL_TABLET | Freq: Four times a day (QID) | ORAL | Status: DC | PRN
  Administered 2021-11-29: 600 mg via ORAL
  Filled 2021-11-26: qty 3

## 2021-11-26 MED ORDER — TIZANIDINE HCL 4 MG PO TABS: 4.0000 mg | ORAL_TABLET | Freq: Three times a day (TID) | ORAL | Status: DC

## 2021-11-26 MED ORDER — SODIUM CHLORIDE 0.9% FLUSH
3.0000 mL | Freq: Two times a day (BID) | INTRAVENOUS | Status: DC
  Administered 2021-11-26 – 2021-11-29 (×8): 3 mL via INTRAVENOUS

## 2021-11-26 MED ORDER — ALBUTEROL SULFATE (2.5 MG/3ML) 0.083% IN NEBU: 2.5000 mg | INHALATION_SOLUTION | Freq: Four times a day (QID) | RESPIRATORY_TRACT | Status: DC | PRN

## 2021-11-26 MED ORDER — MELOXICAM 7.5 MG PO TABS
15.0000 mg | ORAL_TABLET | Freq: Every day | ORAL | Status: DC
  Administered 2021-11-27 – 2021-11-30 (×4): 15 mg via ORAL
  Filled 2021-11-26 (×4): qty 2

## 2021-11-26 MED ORDER — HYDROCODONE-ACETAMINOPHEN 7.5-325 MG/15ML PO SOLN: 10.0000 mL | Freq: Three times a day (TID) | ORAL | Status: DC | PRN

## 2021-11-26 MED ORDER — LOTEPREDNOL ETABONATE 0.5 % OP SUSP
1.0000 [drp] | Freq: Four times a day (QID) | OPHTHALMIC | Status: DC
  Administered 2021-11-26 – 2021-11-30 (×15): 1 [drp] via OPHTHALMIC

## 2021-11-26 MED ORDER — LISINOPRIL 20 MG PO TABS
20.0000 mg | ORAL_TABLET | Freq: Every day | ORAL | Status: DC
  Administered 2021-11-27 – 2021-11-30 (×2): 20 mg via ORAL
  Filled 2021-11-26 (×3): qty 1

## 2021-11-26 MED ORDER — PANTOPRAZOLE SODIUM 40 MG PO TBEC
40.0000 mg | DELAYED_RELEASE_TABLET | Freq: Every day | ORAL | Status: DC
  Administered 2021-11-27 – 2021-11-30 (×4): 40 mg via ORAL
  Filled 2021-11-26 (×4): qty 1

## 2021-11-26 MED ORDER — SEMAGLUTIDE 14 MG PO TABS
14.0000 mg | ORAL_TABLET | Freq: Every day | ORAL | Status: DC
  Administered 2021-11-27 – 2021-11-30 (×4): 14 mg via ORAL
  Filled 2021-11-26 (×6): qty 1

## 2021-11-26 MED ORDER — POTASSIUM CHLORIDE 20 MEQ PO PACK
80.0000 meq | PACK | Freq: Once | ORAL | Status: AC
Start: 2021-11-26 — End: 2021-11-26
  Administered 2021-11-26: 80 meq via ORAL
  Filled 2021-11-26: qty 4

## 2021-11-26 MED ORDER — DOXEPIN HCL 50 MG PO CAPS
50.0000 mg | ORAL_CAPSULE | Freq: Every day | ORAL | Status: DC
  Administered 2021-11-26 – 2021-11-29 (×4): 50 mg via ORAL
  Filled 2021-11-26 (×4): qty 1

## 2021-11-26 MED ORDER — OLANZAPINE 2.5 MG PO TABS
10.0000 mg | ORAL_TABLET | Freq: Two times a day (BID) | ORAL | Status: DC
  Administered 2021-11-26 – 2021-11-30 (×8): 10 mg via ORAL
  Filled 2021-11-26 (×8): qty 4

## 2021-11-26 MED ORDER — LORATADINE 10 MG PO TABS
10.0000 mg | ORAL_TABLET | Freq: Every day | ORAL | Status: DC
  Administered 2021-11-26 – 2021-11-29 (×4): 10 mg via ORAL
  Filled 2021-11-26 (×4): qty 1

## 2021-11-26 NOTE — Progress Notes (Signed)
EMU hooked up. [facility] monitoring.

## 2021-11-26 NOTE — Progress Notes (Signed)
  Transition of Care [facility]) Screening Note   Patient Details  Name: Andrea [name] Date of Birth: [birth year]   Transition of Care [facility]) CM/SW Contact:    Pollie Friar, RN Phone Number: 11/26/2021, 2:34 PM    Transition of Care Department [facility]) has reviewed patient and no TOC needs have been identified at this time. We will continue to monitor patient advancement through interdisciplinary progression rounds. If new patient transition needs arise, please place a TOC consult.

## 2021-11-26 NOTE — H&P (Addendum)
CC: convulsion  History is obtained from: Patient, chart review  HPI: Andrea [name] is a 41 y.o. female with past medical history of hypertension diabetes, migraine, bipolar disorder who is admitted to epilepsy monitoring unit for characterization of seizure-like episodes.  History of status: Patient reports episode started on July 31, 2021 while at work.  States she feels "dizzy" during the episode and then sits down. She does not remember anything during the episode.  Per husband, she looks like she is "looking through you" followed by sitting down, eyes are closed, has jerking of all extremities.  Denies any tongue bite.  Has had urinary incontinence once, denies bowel incontinence.  Denies any clear provoking/aggravating factors.  States after the episode she has bifrontal headache and feels tingly on the left face, arms and legs.  Per husband once her face also looked droopy on the left side after the episode.  States episodes have happened within 24 hours and have been as far as 1.5 months apart.  Last episode was on 10/04/2021.  States it was around the time when her Zonegran dose was increased.  Epilepsy risk factors: Born at term, denies febrile seizures, meningitis/encephalitis, family history of epilepsy, brain injury with loss of consciousness  ROS: All other systems reviewed and negative except as noted in the HPI.   Past Medical History:  Diagnosis Date   Anxiety    Bipolar 1 disorder ([place])    Complication of anesthesia    hard time waking up    Depression    Diabetes mellitus without complication (HCC)    GERD (gastroesophageal reflux disease)    no meds   Kidney stones    Low back pain    Migraines    PCOS (polycystic ovarian syndrome)    PONV (postoperative nausea and vomiting)    Schizophrenia (HCC)    Shortness of breath dyspnea    with bronchitis    Family History  Problem Relation Age of Onset   Healthy Mother    Healthy Father     Social History:   reports that she has never smoked. She has never used smokeless tobacco. She reports that she does not drink alcohol and does not use drugs.  Medications Prior to Admission  Medication Sig Dispense Refill Last Dose   albuterol (VENTOLIN HFA) 108 (90 Base) MCG/ACT inhaler Inhale 1-2 puffs into the lungs every 6 (six) hours as needed for wheezing or shortness of breath. 18 g 0    doxepin (SINEQUAN) 50 MG capsule Take 50 mg by mouth at bedtime.      FLUoxetine (PROZAC) 20 MG capsule Take 1 capsule (20 mg total) by mouth daily. (Patient taking differently: Take 60 mg by mouth daily.) 90 capsule 0    gabapentin (NEURONTIN) 300 MG capsule Take 1 capsule (300 mg total) by mouth 3 (three) times daily. 90 capsule 0    HYDROcodone-acetaminophen (NORCO) 7.5-325 MG tablet Take 1 tablet by mouth every 6 (six) hours as needed.      JARDIANCE 25 MG TABS tablet Take 25 mg by mouth daily.      levocetirizine (XYZAL) 5 MG tablet Take 5 mg by mouth every evening.      lisinopril-hydrochlorothiazide (ZESTORETIC) 20-25 MG tablet Take 1 tablet by mouth daily.      meloxicam (MOBIC) 15 MG tablet Take 15 mg by mouth daily.      metoprolol succinate (TOPROL-XL) 25 MG 24 hr tablet Take 1 tablet by mouth daily.  OLANZapine (ZYPREXA) 10 MG tablet Take 10 mg by mouth in the morning and at bedtime.      oxybutynin (DITROPAN-XL) 5 MG 24 hr tablet Take one tablet twice daily 60 tablet 2    pantoprazole (PROTONIX) 40 MG tablet Take 1 tablet by mouth daily.      RYBELSUS 14 MG TABS Take 1 tablet by mouth daily.      tiZANidine (ZANAFLEX) 4 MG tablet Take 4 mg by mouth every 8 (eight) hours as needed for muscle spasms.      TRESIBA FLEXTOUCH 100 UNIT/ML FlexTouch Pen Inject 20 Units into the skin daily.      zonisamide (ZONEGRAN) 100 MG capsule Take 4 capsules every night 120 capsule 6       Exam: Current vital signs: Temp 98.3 F (36.8 C) (Oral)   LMP  (LMP Unknown)  Vital signs in last 24 hours: Temp:  [98.3 F  (36.8 C)] 98.3 F (36.8 C) (06/12 4010)   Physical Exam  Constitutional: Appears well-developed and well-nourished.  Psych: Affect appropriate to situation Eyes: No scleral injection Neuro: AOx3, cranial nerves II through XII grossly intact, 5/5 in all 4 extremities, FTN intact bilaterally  I have reviewed labs in epic and the results pertinent to this consultation are: CBC: No results for input(s): "WBC", "NEUTROABS", "HGB", "HCT", "MCV", "PLT" in the last 168 hours.  Basic Metabolic Panel:  Lab Results  Component Value Date   NA 137 10/02/2021   K 3.6 10/02/2021   CO2 25 10/02/2021   GLUCOSE 145 (H) 10/02/2021   BUN 25 (H) 10/02/2021   CREATININE 1.09 (H) 10/02/2021   CALCIUM 9.2 10/02/2021   GFRNONAA >60 10/02/2021   GFRAA >60 01/01/2020   Lipid Panel:  Lab Results  Component Value Date   LDLCALC 93 02/22/2014   HgbA1c:  Lab Results  Component Value Date   HGBA1C 7.5 (H) 07/31/2021   Urine Drug Screen:     Component Value Date/Time   LABOPIA NONE DETECTED 12/20/2016 1031   COCAINSCRNUR NONE DETECTED 12/20/2016 1031   LABBENZ NONE DETECTED 12/20/2016 1031   AMPHETMU NONE DETECTED 12/20/2016 1031   THCU NONE DETECTED 12/20/2016 1031   LABBARB NONE DETECTED 12/20/2016 1031    Alcohol Level     Component Value Date/Time   ETH <5 12/20/2016 1039     I have reviewed the images obtained: H. Rivera Colon wo contrast 10/02/2021: No acute intracranial abnormality.  MR Brain w and wo contrast 07/31/2021: Normal MRI of the head with contrast.  ASSESSMENT/PLAN: 41 year old female with multiple medical comorbidities as noted above who is admitted to epilepsy monitoring unit for characterization of spells.  Convulsions - Will obtain video EEG for characterization of spells - Hold zonisamide and gabapentin - Sleep deprivation tonight - Will likely perform HV and photic stimulation tomorrow - Seizure precautions  Hypertension -Continue home medications  Diabetes -Continue  home medications -Fingerstick before every meal at bedtime  Chronic pain -Continue home medications  Asthma -Continue home inhaler  Allergy -Continue home medications  Hypokalemia -P.o. supplementation  Obesity -Complicates medical care  [facility] neurohospitalist

## 2021-11-26 NOTE — Progress Notes (Signed)
Critical lab value received for patient. Potassium of 2.7. Dr. Hortense Ramal notified. Oral replacement ordered.  Gwendolyn Grant, RN

## 2021-11-27 DIAGNOSIS — R569 Unspecified convulsions: Secondary | ICD-10-CM | POA: Diagnosis not present

## 2021-11-27 LAB — GLUCOSE, CAPILLARY
Glucose-Capillary: 141 mg/dL — ABNORMAL HIGH (ref 70–99)
Glucose-Capillary: 175 mg/dL — ABNORMAL HIGH (ref 70–99)
Glucose-Capillary: 143 mg/dL — ABNORMAL HIGH (ref 70–99)
Glucose-Capillary: 162 mg/dL — ABNORMAL HIGH (ref 70–99)

## 2021-11-27 LAB — RAPID URINE DRUG SCREEN, HOSP PERFORMED
Amphetamines: NOT DETECTED
Barbiturates: NOT DETECTED
Benzodiazepines: NOT DETECTED
Cocaine: NOT DETECTED
Opiates: POSITIVE — AB
Tetrahydrocannabinol: NOT DETECTED

## 2021-11-27 LAB — POTASSIUM: Potassium: 3.4 mmol/L — ABNORMAL LOW (ref 3.5–5.1)

## 2021-11-27 MED ORDER — ONDANSETRON HCL 4 MG/2ML IJ SOLN
4.0000 mg | Freq: Four times a day (QID) | INTRAMUSCULAR | Status: DC | PRN
  Administered 2021-11-27 – 2021-11-28 (×3): 4 mg via INTRAVENOUS
  Filled 2021-11-27 (×3): qty 2

## 2021-11-27 MED ORDER — ONDANSETRON HCL 4 MG PO TABS: 4.0000 mg | ORAL_TABLET | Freq: Three times a day (TID) | ORAL | Status: DC | PRN

## 2021-11-27 NOTE — Progress Notes (Signed)
vLTM maintenance  All impedances below 10kohms

## 2021-11-27 NOTE — Progress Notes (Signed)
Pt had a quiet night, no seizure activity observed, v/s stable. Obasogie-Asidi, Pascha Fogal Efe

## 2021-11-27 NOTE — Progress Notes (Signed)
Pt was able to stay awake till 0200, slept off and on thereafter. Obasogie-Asidi, Andrea [name]

## 2021-11-27 NOTE — Progress Notes (Signed)
Subjective: NAEO. No concerns  ROS: negative except above  Examination  Vital signs in last 24 hours: Temp:  [98.1 F (36.7 C)-98.6 F (37 C)] 98.4 F (36.9 C) (06/13 0745) Pulse Rate:  [63-66] 65 (06/13 0745) Resp:  [14-17] 16 (06/13 0745) BP: (113-126)/(54-73) 119/71 (06/13 0745) SpO2:  [91 %-97 %] 93 % (06/13 0745)  General: lying in bed, NAD Neuro: AOx3, cranial nerves II through XII grossly intact, 5/5 in all 4 extremities, FTN intact bilaterally  Basic Metabolic Panel: Recent Labs  Lab 11/26/21 0910 11/27/21 0234  NA 140  --   K 2.7* 3.4*  CL 103  --   CO2 26  --   GLUCOSE 181*  --   BUN 22*  --   CREATININE 1.41*  --   CALCIUM 9.2  --   MG 2.1  --   PHOS 4.5  --     CBC: Recent Labs  Lab 11/26/21 0910  WBC 6.5  NEUTROABS 3.8  HGB 11.8*  HCT 36.4  MCV 93.6  PLT 193     Coagulation Studies: Recent Labs    11/26/21 0910  LABPROT 14.4  INR 1.1    Imaging No new brain imaging  ASSESSMENT AND PLAN: 41 year old female with multiple medical comorbidities as noted above who is admitted to epilepsy monitoring unit for characterization of spells.   Convulsions - Continue video EEG for characterization of spells - Continue to hold zonisamide and gabapentin - Will perform HV and photic stimulation tomorrow - Discussed overnight eeg findings - Seizure precautions  Hypertension -Continue home medications   Diabetes -Continue home medications -Fingerstick before every meal at bedtime   Chronic pain -Continue home medications   Asthma -Continue home inhaler   Allergy -Continue home medications   Obesity -Complicates medical care   I have spent a total of 25  minutes with the patient reviewing hospital notes,  test results, labs and examining the patient as well as establishing an assessment and plan that was discussed personally with the patient.  > 50% of time was spent in direct patient care.     [facility]  neurohospitalist

## 2021-11-27 NOTE — Procedures (Signed)
Patient Name: Andrea [name]  MRN: [id number]  Epilepsy Attending: Lora Havens  Referring Physician/Provider: Lora Havens, MD  Duration: 11/26/2021 1052 to 11/27/2021 1052  Patient history: 41 year old female with seizure-like activity.  EEG to evaluate for seizure  Level of alertness: Awake, asleep  AEDs during EEG study: None  Technical aspects: This EEG study was done with scalp electrodes positioned according to the 10-20 International system of electrode placement. Electrical activity was acquired at a sampling rate of '500Hz'$  and reviewed with a high frequency filter of '70Hz'$  and a low frequency filter of '1Hz'$ . EEG data were recorded continuously and digitally stored.   Description: The posterior dominant rhythm consists of 9-10 Hz activity of moderate voltage (25-35 uV) seen predominantly in posterior head regions, symmetric and reactive to eye opening and eye closing. Sleep was characterized by vertex waves, sleep spindles (12 to 14 Hz), maximal frontocentral region. Photic driving was not seen during photic stimulation.  No EEG change was seen during hyperventilation.  IMPRESSION: This study is within normal limits. No seizures or epileptiform discharges were seen throughout the recording.  [name]

## 2021-11-28 DIAGNOSIS — R569 Unspecified convulsions: Secondary | ICD-10-CM | POA: Diagnosis not present

## 2021-11-28 LAB — GLUCOSE, CAPILLARY
Glucose-Capillary: 142 mg/dL — ABNORMAL HIGH (ref 70–99)
Glucose-Capillary: 134 mg/dL — ABNORMAL HIGH (ref 70–99)
Glucose-Capillary: 135 mg/dL — ABNORMAL HIGH (ref 70–99)
Glucose-Capillary: 143 mg/dL — ABNORMAL HIGH (ref 70–99)

## 2021-11-28 MED ORDER — ENOXAPARIN SODIUM 80 MG/0.8ML IJ SOSY
70.0000 mg | PREFILLED_SYRINGE | INTRAMUSCULAR | Status: DC
  Administered 2021-11-28 – 2021-11-29 (×2): 70 mg via SUBCUTANEOUS
  Filled 2021-11-28 (×2): qty 0.8

## 2021-11-28 MED ORDER — POTASSIUM CHLORIDE 20 MEQ PO PACK
20.0000 meq | PACK | Freq: Once | ORAL | Status: AC
Start: 2021-11-28 — End: 2021-11-28
  Administered 2021-11-28: 20 meq via ORAL
  Filled 2021-11-28: qty 1

## 2021-11-28 NOTE — Progress Notes (Signed)
MB maintenance/ checked T4, T6, F3 elecrotodes for skin breakdown and verify all electrodes are below 10k ohms

## 2021-11-28 NOTE — Progress Notes (Signed)
LG Maintenanced electrodes earlier this am, MB performed HV and Photic a short time later per Neurology Dr.

## 2021-11-28 NOTE — Progress Notes (Signed)
Subjective: Has been nauseous and had 2 episodes of vomiting overnight.  No new concerns.  ROS: negative except above  Examination  Vital signs in last 24 hours: Temp:  [97.7 F (36.5 C)-98.5 F (36.9 C)] 98.5 F (36.9 C) (06/14 0901) Pulse Rate:  [69-86] 72 (06/14 0901) Resp:  [13-18] 16 (06/14 0901) BP: (100-123)/(64-88) 100/68 (06/14 0901) SpO2:  [90 %-97 %] 94 % (06/14 0901) Weight:  [136.9 kg] 136.9 kg (06/14 0951)    General: lying in bed, NAD Neuro: AOx3, cranial nerves II through XII grossly intact, 5/5 in all 4 extremities, FTN intact bilaterally  Basic Metabolic Panel: Recent Labs  Lab 11/26/21 0910 11/27/21 0234  NA 140  --   K 2.7* 3.4*  CL 103  --   CO2 26  --   GLUCOSE 181*  --   BUN 22*  --   CREATININE 1.41*  --   CALCIUM 9.2  --   MG 2.1  --   PHOS 4.5  --     CBC: Recent Labs  Lab 11/26/21 0910  WBC 6.5  NEUTROABS 3.8  HGB 11.8*  HCT 36.4  MCV 93.6  PLT 193     Coagulation Studies: Recent Labs    11/26/21 0910  LABPROT 14.4  INR 1.1    Imaging No new brain imaging   ASSESSMENT AND PLAN: 41 year old female with multiple medical comorbidities as noted above who is admitted to epilepsy monitoring unit for characterization of spells.   Convulsions - Continue video EEG for characterization of spells - Continue to hold zonisamide and gabapentin - Discussed overnight eeg findings - Seizure precautions  Nausea and vomiting -As needed Zofran  Hypokalemia -P.o. supplementation  Hypertension -Continue home medications   Diabetes -Continue home medications -Fingerstick before every meal at bedtime   Chronic pain -Continue home medications   Asthma -Continue home inhaler   Allergy -Continue home medications   Obesity -Complicates medical care    I have spent a total of 26 minutes with the patient reviewing hospital notes,  test results, labs and examining the patient as well as establishing an assessment and plan that  was discussed personally with the patient.  > 50% of time was spent in direct patient care.    [facility] For questions after 5pm please refer to AMION to reach the Neurologist on call

## 2021-11-28 NOTE — Procedures (Signed)
Patient Name: [name]  MRN: [id number]  Epilepsy Attending: Lora Havens  Referring Physician/Provider: Lora Havens, MD  Duration: 11/27/2021 1052 to 11/28/2021 1052   Patient history: 41 year old female with seizure-like activity.  EEG to evaluate for seizure   Level of alertness: Awake, asleep   AEDs during EEG study: None   Technical aspects: This EEG study was done with scalp electrodes positioned according to the 10-20 International system of electrode placement. Electrical activity was acquired at a sampling rate of '500Hz'$  and reviewed with a high frequency filter of '70Hz'$  and a low frequency filter of '1Hz'$ . EEG data were recorded continuously and digitally stored.    Description: The posterior dominant rhythm consists of 9-10 Hz activity of moderate voltage (25-35 uV) seen predominantly in posterior head regions, symmetric and reactive to eye opening and eye closing. Sleep was characterized by vertex waves, sleep spindles (12 to 14 Hz), maximal frontocentral region.  Hyperventilation and photic stimulation were not performed.      IMPRESSION: This study is within normal limits. No seizures or epileptiform discharges were seen throughout the recording.   Andrea [name]

## 2021-11-29 DIAGNOSIS — R569 Unspecified convulsions: Secondary | ICD-10-CM | POA: Diagnosis not present

## 2021-11-29 LAB — GLUCOSE, CAPILLARY
Glucose-Capillary: 135 mg/dL — ABNORMAL HIGH (ref 70–99)
Glucose-Capillary: 124 mg/dL — ABNORMAL HIGH (ref 70–99)
Glucose-Capillary: 135 mg/dL — ABNORMAL HIGH (ref 70–99)
Glucose-Capillary: 148 mg/dL — ABNORMAL HIGH (ref 70–99)

## 2021-11-29 MED ORDER — OXYBUTYNIN CHLORIDE 5 MG PO TABS
5.0000 mg | ORAL_TABLET | Freq: Two times a day (BID) | ORAL | Status: DC
  Administered 2021-11-29 – 2021-11-30 (×3): 5 mg via ORAL
  Filled 2021-11-29 (×3): qty 1

## 2021-11-29 MED ORDER — ZONISAMIDE 100 MG PO CAPS
400.0000 mg | ORAL_CAPSULE | Freq: Every day | ORAL | Status: DC
  Administered 2021-11-29: 400 mg via ORAL
  Filled 2021-11-29: qty 4

## 2021-11-29 MED ORDER — GABAPENTIN 300 MG PO CAPS
300.0000 mg | ORAL_CAPSULE | Freq: Three times a day (TID) | ORAL | Status: DC
Start: 2021-11-29 — End: 2021-11-30
  Administered 2021-11-29 – 2021-11-30 (×2): 300 mg via ORAL
  Filled 2021-11-29 (×2): qty 1

## 2021-11-29 NOTE — Progress Notes (Signed)
EEG maintenance performed. No skin breakdown. A2, O2 and T8 were checked.

## 2021-11-29 NOTE — Progress Notes (Addendum)
Subjective: No acute events overnight.  Nausea/vomiting has improved.  ROS: negative except above  Examination  Vital signs in last 24 hours: Temp:  [97.5 F (36.4 C)-98.7 F (37.1 C)] 98.5 F (36.9 C) (06/15 0900) Pulse Rate:  [64-86] 70 (06/15 0900) Resp:  [12-20] 19 (06/15 0900) BP: (102-133)/(52-83) 102/56 (06/15 0900) SpO2:  [90 %-94 %] 92 % (06/15 0900) Weight:  [136.9 kg] 136.9 kg (06/14 0951)  General: lying in bed, NAD Neuro: AOx3, cranial nerves II through XII grossly intact, 5/5 in all 4 extremities, FTN intact bilaterally  Basic Metabolic Panel: Recent Labs  Lab 11/26/21 0910 11/27/21 0234  NA 140  --   K 2.7* 3.4*  CL 103  --   CO2 26  --   GLUCOSE 181*  --   BUN 22*  --   CREATININE 1.41*  --   CALCIUM 9.2  --   MG 2.1  --   PHOS 4.5  --     CBC: Recent Labs  Lab 11/26/21 0910  WBC 6.5  NEUTROABS 3.8  HGB 11.8*  HCT 36.4  MCV 93.6  PLT 193     Coagulation Studies: No results for input(s): "LABPROT", "INR" in the last 72 hours.  Imaging No new brain imaging   ASSESSMENT AND PLAN: 41 year old female with multiple medical comorbidities as noted above who is admitted to epilepsy monitoring unit for characterization of spells.   Convulsions - Continue video EEG for characterization of spells - Resume zonisamide and gabapentin tonight - Discussed overnight eeg findings - Seizure precautions   Nausea and vomiting -As needed Zofran  Hypertension -Continue home medications   Diabetes -Continue home medications -Fingerstick before every meal at bedtime   Chronic pain -Continue home medications   Asthma -Continue home inhaler   Allergy -Continue home medications   Obesity -Complicates medical care     I have spent a total of 36 minutes with the patient reviewing hospital notes,  test results, labs and examining the patient as well as establishing an assessment and plan that was discussed personally with the patient.  > 50% of  time was spent in direct patient care.     [facility] For questions after 5pm please refer to AMION to reach the Neurologist on call

## 2021-11-29 NOTE — Progress Notes (Signed)
EMU LTM maint complete - no skin breakdown under: Fp1 Fp2  Serviced several leads/  [facility] monitored

## 2021-11-29 NOTE — Procedures (Addendum)
Patient Name: Andrea [name]  MRN: [id number]  Epilepsy Attending: Lora Havens  Referring Physician/Provider: Lora Havens, MD  Duration: 11/28/2021 1052 to 11/29/2021 1052   Patient history: 41 year old female with seizure-like activity.  EEG to evaluate for seizure   Level of alertness: Awake, asleep   AEDs during EEG study: None   Technical aspects: This EEG study was done with scalp electrodes positioned according to the 10-20 International system of electrode placement. Electrical activity was acquired at a sampling rate of '500Hz'$  and reviewed with a high frequency filter of '70Hz'$  and a low frequency filter of '1Hz'$ . EEG data were recorded continuously and digitally stored.    Description: The posterior dominant rhythm consists of 9-10 Hz activity of moderate voltage (25-35 uV) seen predominantly in posterior head regions, symmetric and reactive to eye opening and eye closing. Sleep was characterized by vertex waves, sleep spindles (12 to 14 Hz), maximal frontocentral region.  Hyperventilation and photic stimulation were not performed.      IMPRESSION: This study is within normal limits. No seizures or epileptiform discharges were seen throughout the recording.   Rollo Farquhar Barbra Sarks

## 2021-11-30 ENCOUNTER — Inpatient Hospital Stay (HOSPITAL_COMMUNITY): Payer: No Typology Code available for payment source

## 2021-11-30 DIAGNOSIS — R569 Unspecified convulsions: Secondary | ICD-10-CM | POA: Diagnosis not present

## 2021-11-30 LAB — GLUCOSE, CAPILLARY: Glucose-Capillary: 216 mg/dL — ABNORMAL HIGH (ref 70–99)

## 2021-11-30 NOTE — Discharge Instructions (Addendum)
You were admitted to epilepsy monitoring unit from 11/26/2021 to 11/30/2021.  During this time, you underwent continuous video EEG monitoring, sleep deprivation, hyperventilation and photic stimulation were performed, your seizure medications were held.  No typical events were reported.  Your EEG was within normal limits.  We discussed that people with epilepsy can have normal EEGs.  Also discussed other differentials including migraine with aura, nonepileptic events.  Your episodes have been controlled on the current dose of zonisamide.  Therefore we recommend continuing current medications.  Continue seizure precautions for now.  Of note, your systolic blood pressure was between 100-140 during the hospitalization and therefore your antihypertensives were held.  We discussed maintaining a blood pressure diary and speaking with your primary care provider next week to discuss adjustments in blood pressure medication.

## 2021-11-30 NOTE — Progress Notes (Signed)
LTM EEG discontinued - no skin breakdown at unhook.   

## 2021-11-30 NOTE — Discharge Summary (Addendum)
Physician Discharge Summary  Patient ID: Andrea [name] MRN: [id number] DOB/AGE: 1980/07/28 41 y.o.  Admit date: 11/26/2021 Discharge date: 11/30/2021  Admission Diagnoses: Convulsion  Discharge Diagnoses: Convulsions  Discharged Condition: stable  Hospital Course: Andrea [name] was admitted to epilepsy monitoring unit from 11/26/2021 to 11/30/2021.  During this time, she underwent continuous video EEG monitoring, seizure provocative maneuvers were performed including hyperventilation, photic stimulation, sleep deprivation, antiepileptics were held.  The EEG was within normal limits, no typical events were reported.  On chart review, patient has had episodes of right-sided tingling numbness and slurred speech in 2017 and was evaluated at [facility].  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  Discussed the diagnosis of migraine with aura versus nonepileptic event with patient.  Recommended continuing current medications as her symptoms are well controlled.  However, patient did report prior renal calculi (unsure what type of calculi).  She has been well controlled on Topamax in the past.  Will notify patient's primary neurologist Dr. [name].  Discussed seizure precautions including do not drive.  Of note, during admission, patient's blood pressure was within normal limits even though her antihypertensives were held for more than 48 hours.  I recommended patient keep a blood pressure diary and contact her PCP immediately to schedule a follow-up appointment.  Consults: None  Significant Diagnostic Studies:   Description: The posterior dominant rhythm consists of 9-10 Hz activity of moderate voltage (25-35 uV) seen predominantly in posterior head regions, symmetric and reactive to eye opening and eye closing. Sleep was characterized by vertex waves, sleep spindles (12 to 14 Hz), maximal frontocentral region. Photic driving was not seen  during photic stimulation.  No EEG change was seen during hyperventilation.   IMPRESSION: This study is within normal limits. No seizures or epileptiform discharges were seen throughout the recording.   Olukemi Panchal Barbra Sarks   Treatments: Continue home medication  Discharge Exam: Blood pressure 121/72, pulse 83, temperature 97.9 F (36.6 C), temperature source Oral, resp. rate 19, weight (!) 136.9 kg, SpO2 93 %.  Constitutional: Appears well-developed and well-nourished.  Psych: Affect appropriate to situation Eyes: No scleral injection Neuro: AOx3, cranial nerves II through XII grossly intact, 5/5 in all 4 extremities, FTN intact bilaterally  Disposition: Discharge disposition: 01-Home or Self Care     Home  Discharge Instructions     Call MD for:   Complete by: As directed    If patient has another seizure, call 911 and bring them back to the ED if: A.  The seizure lasts longer than 5 minutes.      B.  The patient doesn't wake shortly after the seizure or has new problems such as difficulty seeing, speaking or moving following the seizure C.  The patient was injured during the seizure D.  The patient has a temperature over 102 F (39C) E.  The patient vomited during the seizure and now is having trouble breathing      Diet - low sodium heart healthy   Complete by: As directed    Increase activity slowly   Complete by: As directed    Other Restrictions   Complete by: As directed    Seizure precautions: Per [facility] statutes, patients with seizures are not allowed to drive until they have been seizure-free for six months and cleared by a physician    Use caution when using heavy equipment or power tools. Avoid working on ladders or at heights. Take  showers instead of baths. Ensure the water temperature is not too high on the home water heater. Do not go swimming alone. Do not lock yourself in a room alone (i.e. bathroom). When caring for infants or small children, sit  down when holding, feeding, or changing them to minimize risk of injury to the child in the event you have a seizure. Maintain good sleep hygiene. Avoid alcohol.         Allergies as of 11/30/2021       Reactions   Bactrim [sulfamethoxazole-trimethoprim] Hives, Itching   Codeine Hives, Itching   Hydrocodone-acetaminophen Itching   Can take with benadryl        Medication List     TAKE these medications    albuterol 108 (90 Base) MCG/ACT inhaler Commonly known as: VENTOLIN HFA Inhale 1-2 puffs into the lungs every 6 (six) hours as needed for wheezing or shortness of breath.   diphenhydrAMINE 25 MG tablet Commonly known as: BENADRYL Take 25 mg by mouth 3 (three) times daily as needed for itching (with each dose of hydrocodone/apap).   doxepin 50 MG capsule Commonly known as: SINEQUAN Take 50 mg by mouth at bedtime.   FLUoxetine 20 MG capsule Commonly known as: PROzac Take 1 capsule (20 mg total) by mouth daily. What changed:  how much to take when to take this   gabapentin 300 MG capsule Commonly known as: NEURONTIN Take 1 capsule (300 mg total) by mouth 3 (three) times daily. What changed: when to take this   HYDROcodone-acetaminophen 7.5-325 MG tablet Commonly known as: NORCO Take 1 tablet by mouth 3 (three) times daily as needed (pain). Take with benadryl   Jardiance 25 MG Tabs tablet Generic drug: empagliflozin Take 25 mg by mouth every morning.   levocetirizine 5 MG tablet Commonly known as: XYZAL Take 5 mg by mouth at bedtime.   lisinopril-hydrochlorothiazide 20-25 MG tablet Commonly known as: ZESTORETIC Take 1 tablet by mouth every morning.   loteprednol 0.5 % ophthalmic suspension Commonly known as: LOTEMAX Place 1 drop into the left eye 4 (four) times daily.   meloxicam 15 MG tablet Commonly known as: MOBIC Take 15 mg by mouth every morning.   metoprolol succinate 25 MG 24 hr tablet Commonly known as: TOPROL-XL Take 25 mg by mouth every  morning.   OLANZapine 10 MG tablet Commonly known as: ZYPREXA Take 10 mg by mouth in the morning and at bedtime.   oxybutynin 5 MG 24 hr tablet Commonly known as: DITROPAN-XL Take one tablet twice daily What changed:  how much to take how to take this when to take this additional instructions   pantoprazole 40 MG tablet Commonly known as: PROTONIX Take 40 mg by mouth every morning.   Rybelsus 14 MG Tabs Generic drug: Semaglutide Take 14 mg by mouth every morning.   tiZANidine 4 MG tablet Commonly known as: ZANAFLEX Take 4 mg by mouth every 8 (eight) hours as needed for muscle spasms.   [name] FlexTouch 100 UNIT/ML FlexTouch Pen Generic drug: insulin degludec Inject 20 Units into the skin 2 (two) times daily with a meal.   zonisamide 100 MG capsule Commonly known as: ZONEGRAN Take 4 capsules every night What changed:  how much to take when to take this additional instructions        I have spent a total of 39 minutes with the patient reviewing hospital notes,  test results, labs and examining the patient as well as establishing an assessment and plan that was  discussed personally with the patient.  > 50% of time was spent in direct patient care.   Signed: Lora Havens 11/30/2021, 10:23 AM

## 2021-11-30 NOTE — TOC Transition Note (Signed)
Transition of Care [facility]) - CM/SW Discharge Note   Patient Details  Name: Andrea [name] MRN: [id number] Date of Birth: 23-Jan-1981  [facility] CM/SW Contact:  Pollie Friar, RN Phone Number: 11/30/2021, 10:24 AM   Clinical Narrative:    Pt is discharging home with self care. No needs per TOC.   Final next level of care: Home/Self Care Barriers to Discharge: No Barriers Identified   Patient Goals and CMS Choice        Discharge Placement                       Discharge Plan and Services                                     Social Determinants of Health (SDOH) Interventions     Readmission Risk Interventions     No data to display

## 2021-11-30 NOTE — Progress Notes (Signed)
Patient ready for discharge to home; discharge instructions given and reviewed. No new Rx's. Patient dressed and ready; spouse is at the bedside. Patient discharged out via volunteer services.  All belongings returned.

## 2021-11-30 NOTE — Procedures (Signed)
Patient Name: [name]  MRN: [id number]  Epilepsy Attending: Lora Havens  Referring Physician/Provider: Lora Havens, MD  Duration: 11/29/2021 1052 to 11/30/2021 1006   Patient history: 41 year old female with seizure-like activity.  EEG to evaluate for seizure   Level of alertness: Awake, asleep   AEDs during EEG study: None   Technical aspects: This EEG study was done with scalp electrodes positioned according to the 10-20 International system of electrode placement. Electrical activity was acquired at a sampling rate of '500Hz'$  and reviewed with a high frequency filter of '70Hz'$  and a low frequency filter of '1Hz'$ . EEG data were recorded continuously and digitally stored.    Description: The posterior dominant rhythm consists of 9-10 Hz activity of moderate voltage (25-35 uV) seen predominantly in posterior head regions, symmetric and reactive to eye opening and eye closing. Sleep was characterized by vertex waves, sleep spindles (12 to 14 Hz), maximal frontocentral region.  Hyperventilation and photic stimulation were not performed.      IMPRESSION: This study is within normal limits. No seizures or epileptiform discharges were seen throughout the recording.   Everitt Wenner Barbra Sarks

## 2022-01-12 ENCOUNTER — Emergency Department (HOSPITAL_COMMUNITY)
Admission: EM | Admit: 2022-01-12 | Discharge: 2022-01-13 | Disposition: A | Discharge status: Home / Self Care | Payer: No Typology Code available for payment source | Attending: Emergency Medicine | Admitting: Emergency Medicine

## 2022-01-12 ENCOUNTER — Other Ambulatory Visit: Payer: Self-pay

## 2022-01-12 ENCOUNTER — Encounter (HOSPITAL_COMMUNITY): Payer: Self-pay

## 2022-01-12 DIAGNOSIS — Z7984 Long term (current) use of oral hypoglycemic drugs: Secondary | ICD-10-CM | POA: Insufficient documentation

## 2022-01-12 DIAGNOSIS — E119 Type 2 diabetes mellitus without complications: Secondary | ICD-10-CM | POA: Insufficient documentation

## 2022-01-12 DIAGNOSIS — R569 Unspecified convulsions: Secondary | ICD-10-CM | POA: Insufficient documentation

## 2022-01-12 DIAGNOSIS — Z794 Long term (current) use of insulin: Secondary | ICD-10-CM | POA: Insufficient documentation

## 2022-01-12 DIAGNOSIS — I959 Hypotension, unspecified: Secondary | ICD-10-CM | POA: Insufficient documentation

## 2022-01-12 DIAGNOSIS — R0789 Other chest pain: Secondary | ICD-10-CM | POA: Diagnosis not present

## 2022-01-12 MED ORDER — KETOROLAC TROMETHAMINE 30 MG/ML IJ SOLN
30.0000 mg | Freq: Once | INTRAMUSCULAR | Status: AC
Start: 2022-01-13 — End: 2022-01-13
  Administered 2022-01-13: 30 mg via INTRAVENOUS
  Filled 2022-01-12: qty 1

## 2022-01-12 MED ORDER — SODIUM CHLORIDE 0.9 % IV BOLUS
1000.0000 mL | Freq: Once | INTRAVENOUS | Status: AC
Start: 2022-01-13 — End: 2022-01-13
  Administered 2022-01-13: 1000 mL via INTRAVENOUS

## 2022-01-12 NOTE — ED Triage Notes (Signed)
Pt presents to ED with c/o seizure tonight and chest pain that started afterwards. Pt describes pain as pressure to central chest. Spouse says he witnessed two seizures around 2230 this evening, the first one lasting approx 5 minutes and the second lasting approx 30 sec, reports pt was shaking and jerking with both, and was confused and lethargic afterwards for about 15 minutes. No incontinence, no biting of tongue, pt alert and oriented x 4 at this time.  Denies missing any seizure meds.

## 2022-01-12 NOTE — ED Notes (Signed)
Edp in room  

## 2022-01-12 NOTE — ED Provider Notes (Signed)
[facility] EMERGENCY DEPARTMENT Provider Note   CSN: [id number] Arrival date & time: 01/12/22  2310     History  Chief Complaint  Patient presents with   Seizures    Chest pain     Andrea [name] is a 41 y.o. female.  Patient is a 41 year old female with past medical history of seizures diagnosed in February 2023, type 2 diabetes, bipolar, migraines.  Patient presenting today for evaluation of seizure activity and chest discomfort.  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  She had a normal workday today, then returned home.  Shortly afterward, she was laying down, then had two episodes of seizures.  The first lasted for several minutes, the second for approximately 30 seconds.  This consisted of generalized shaking, but no loss of bowel or bladder function.  She denies any oral trauma.  She takes gabapentin and Zonegran for this and reports being compliant with them.    After waking up, she she describes a "pressure" to the center of her chest with no associated shortness of breath, nausea, diaphoresis, or radiation.  She denies any cardiac history.  The history is provided by the patient.       Home Medications Prior to Admission medications   Medication Sig Start Date End Date Taking? Authorizing Provider  albuterol (VENTOLIN HFA) 108 (90 Base) MCG/ACT inhaler Inhale 1-2 puffs into the lungs every 6 (six) hours as needed for wheezing or shortness of breath. 04/15/21   Jaynee Eagles, PA-C  diphenhydrAMINE (BENADRYL) 25 MG tablet Take 25 mg by mouth 3 (three) times daily as needed for itching (with each dose of hydrocodone/apap).    [provider]  doxepin (SINEQUAN) 50 MG capsule Take 50 mg by mouth at bedtime. 06/04/21   [provider]  empagliflozin (JARDIANCE) 25 MG TABS tablet Take 25 mg by mouth every morning.    [provider]  FLUoxetine (PROZAC) 20 MG capsule Take 1 capsule (20 mg total) by mouth daily. Patient taking differently: Take  60 mg by mouth every morning. 08/14/17   Kathyrn Drown, MD  gabapentin (NEURONTIN) 300 MG capsule Take 1 capsule (300 mg total) by mouth 3 (three) times daily. Patient taking differently: Take 300 mg by mouth 2 (two) times daily. 08/01/21   Allie Bossier, MD  HYDROcodone-acetaminophen (NORCO) 7.5-325 MG tablet Take 1 tablet by mouth 3 (three) times daily as needed (pain). Take with benadryl 09/20/21   [provider]  insulin degludec (TRESIBA FLEXTOUCH) 100 UNIT/ML FlexTouch Pen Inject 20 Units into the skin 2 (two) times daily with a meal.    [provider]  levocetirizine (XYZAL) 5 MG tablet Take 5 mg by mouth at bedtime.    [provider]  lisinopril-hydrochlorothiazide (ZESTORETIC) 20-25 MG tablet Take 1 tablet by mouth every morning.    [provider]  loteprednol (LOTEMAX) 0.5 % ophthalmic suspension Place 1 drop into the left eye 4 (four) times daily. 11/21/21   [provider]  meloxicam (MOBIC) 15 MG tablet Take 15 mg by mouth every morning. 08/30/21   [provider]  metoprolol succinate (TOPROL-XL) 25 MG 24 hr tablet Take 25 mg by mouth every morning. 08/17/20   [provider]  OLANZapine (ZYPREXA) 10 MG tablet Take 10 mg by mouth in the morning and at bedtime.    [provider]  oxybutynin (DITROPAN-XL) 5 MG 24 hr tablet Take one tablet twice daily Patient taking differently: Take 5 mg by mouth 2 (  two) times daily. 09/04/17   Kathyrn Drown, MD  pantoprazole (PROTONIX) 40 MG tablet Take 40 mg by mouth every morning. 08/31/20   [provider]  Semaglutide (RYBELSUS) 14 MG TABS Take 14 mg by mouth every morning.    [provider]  tiZANidine (ZANAFLEX) 4 MG tablet Take 4 mg by mouth every 8 (eight) hours as needed for muscle spasms.    [provider]  zonisamide (ZONEGRAN) 100 MG capsule Take 4 capsules every night Patient taking differently: 400 mg at bedtime. 10/18/21   Cameron Sprang,  MD  fluticasone (FLOVENT HFA) 110 MCG/ACT inhaler Inhale 2 puffs into the lungs 2 (two) times daily. For shortness of breath Patient not taking: Reported on 09/26/2020 12/24/16 10/27/20  Lindell Spar I, NP  metFORMIN (GLUCOPHAGE) 500 MG tablet Take 1 tablet (500 mg total) by mouth 2 (two) times daily with a meal. Patient not taking: No sig reported 01/01/20 10/27/20  Noemi Chapel, MD  trazodone (DESYREL) 300 MG tablet Take 300 mg by mouth at bedtime. Patient not taking: Reported on 09/26/2020  10/27/20  [provider]      Allergies    Bactrim [sulfamethoxazole-trimethoprim], Codeine, and Hydrocodone-acetaminophen    Review of Systems   Review of Systems  All other systems reviewed and are negative.   Physical Exam Updated Vital Signs BP (!) 95/56   Pulse 81   Temp 98.2 F (36.8 C) (Oral)   Resp 18   Ht 6' (1.829 m)   Wt 136.1 kg   LMP  (LMP Unknown)   SpO2 94%   BMI 40.69 kg/m  Physical Exam Vitals and nursing note reviewed.  Constitutional:      General: She is not in acute distress.    Appearance: She is well-developed. She is not diaphoretic.  HENT:     Head: Normocephalic and atraumatic.  Eyes:     Extraocular Movements: Extraocular movements intact.     Pupils: Pupils are equal, round, and reactive to light.  Cardiovascular:     Rate and Rhythm: Normal rate and regular rhythm.     Heart sounds: No murmur heard.    No friction rub. No gallop.  Pulmonary:     Effort: Pulmonary effort is normal. No respiratory distress.     Breath sounds: Normal breath sounds. No wheezing.  Abdominal:     General: Bowel sounds are normal. There is no distension.     Palpations: Abdomen is soft.     Tenderness: There is no abdominal tenderness.  Musculoskeletal:        General: Normal range of motion.     Cervical back: Normal range of motion and neck supple. No rigidity.  Skin:    General: Skin is warm and dry.  Neurological:     General: No focal deficit present.      Mental Status: She is alert and oriented to person, place, and time. Mental status is at baseline.     Cranial Nerves: No cranial nerve deficit.     Sensory: No sensory deficit.     Motor: No weakness.     Coordination: Coordination normal.     ED Results / Procedures / Treatments   Labs (all labs ordered are listed, but only abnormal results are displayed) Labs Reviewed - No data to display  EKG EKG Interpretation  Date/Time:  Saturday January 12 2022 23:30:04 EDT Ventricular Rate:  78 PR Interval:  173 QRS Duration: 95 QT Interval:  391 QTC Calculation: 446  R Axis:   78 Text Interpretation: Sinus rhythm Low voltage, precordial leads Borderline T abnormalities, anterior leads Confirmed by Veryl Speak [phone]) on 01/12/2022 11:35:17 PM  Radiology No results found.  Procedures Procedures  {Document cardiac monitor, telemetry assessment procedure when appropriate:1}  Medications Ordered in ED Medications  sodium chloride 0.9 % bolus 1,000 mL (has no administration in time range)  ketorolac (TORADOL) 30 MG/ML injection 30 mg (has no administration in time range)    ED Course/ Medical Decision Making/ A&P                           Medical Decision Making Amount and/or Complexity of Data Reviewed Labs: ordered.  Risk Prescription drug management.   ***  {Document critical care time when appropriate:1} {Document review of labs and clinical decision tools ie heart score, Chads2Vasc2 etc:1}  {Document your independent review of radiology images, and any outside records:1} {Document your discussion with family members, caretakers, and with consultants:1} {Document social determinants of health affecting pt's care:1} {Document your decision making why or why not admission, treatments were needed:1} Final Clinical Impression(s) / ED Diagnoses Final diagnoses:  None    Rx / DC Orders ED Discharge Orders     None

## 2022-01-13 LAB — COMPREHENSIVE METABOLIC PANEL
ALT: 37 U/L (ref 0–44)
AST: 43 U/L — ABNORMAL HIGH (ref 15–41)
Albumin: 3.9 g/dL (ref 3.5–5.0)
Alkaline Phosphatase: 77 U/L (ref 38–126)
Anion gap: 8 (ref 5–15)
BUN: 22 mg/dL — ABNORMAL HIGH (ref 6–20)
CO2: 24 mmol/L (ref 22–32)
Calcium: 8.8 mg/dL — ABNORMAL LOW (ref 8.9–10.3)
Chloride: 105 mmol/L (ref 98–111)
Creatinine, Ser: 1.04 mg/dL — ABNORMAL HIGH (ref 0.44–1.00)
GFR, Estimated: >60 mL/min (ref >60)
Glucose, Bld: 224 mg/dL — ABNORMAL HIGH (ref 70–99)
Potassium: 3.7 mmol/L (ref 3.5–5.1)
Sodium: 137 mmol/L (ref 135–145)
Total Bilirubin: 0.6 mg/dL (ref 0.3–1.2)
Total Protein: 6.8 g/dL (ref 6.5–8.1)

## 2022-01-13 LAB — CBC WITH DIFFERENTIAL/PLATELET
Abs Immature Granulocytes: 0.02 10*3/uL (ref 0.00–0.07)
Basophils Absolute: 0 10*3/uL (ref 0.0–0.1)
Basophils Relative: 0 %
Eosinophils Absolute: 0.2 10*3/uL (ref 0.0–0.5)
Eosinophils Relative: 3 %
HCT: 35.2 % — ABNORMAL LOW (ref 36.0–46.0)
Hemoglobin: 11.2 g/dL — ABNORMAL LOW (ref 12.0–15.0)
Immature Granulocytes: 0 %
Lymphocytes Relative: 36 %
Lymphs Abs: 2.7 10*3/uL (ref 0.7–4.0)
MCH: 30.2 pg (ref 26.0–34.0)
MCHC: 31.8 g/dL (ref 30.0–36.0)
MCV: 94.9 fL (ref 80.0–100.0)
Monocytes Absolute: 0.5 10*3/uL (ref 0.1–1.0)
Monocytes Relative: 7 %
Neutro Abs: 4 10*3/uL (ref 1.7–7.7)
Neutrophils Relative %: 54 %
Platelets: 204 10*3/uL (ref 150–400)
RBC: 3.71 MIL/uL — ABNORMAL LOW (ref 3.87–5.11)
RDW: 12.8 % (ref 11.5–15.5)
WBC: 7.5 10*3/uL (ref 4.0–10.5)
nRBC: 0 % (ref 0.0–0.2)

## 2022-01-13 LAB — TROPONIN I (HIGH SENSITIVITY): Troponin I (High Sensitivity): 3 ng/L (ref <18)

## 2022-01-13 NOTE — ED Notes (Signed)
No seizure like activity noted during ED stay.

## 2022-01-13 NOTE — Discharge Instructions (Addendum)
Stop your metoprolol, but continue other medications as previously prescribed.  Keep a record of your blood pressures at home and follow-up the results with your primary doctor.  Follow-up with your neurologist in the next week, and return to the ER if symptoms worsen or change.

## 2022-01-14 ENCOUNTER — Encounter: Payer: Self-pay | Admitting: Neurology

## 2022-01-14 ENCOUNTER — Telehealth: Payer: Self-pay | Admitting: Neurology

## 2022-01-14 NOTE — Telephone Encounter (Signed)
Pt c/o: seizure Missed medications?  No. Sleep deprived?  No. Alcohol intake?  No. Increased stress? No. Any change in medication color or shape? No. Any triggers? NO Back to their usual baseline self?  No.. She is very tried per Andrea [name] Current medications prescribed by Dr. [name]: Gabapentin '300mg'$  BID  Zonisaminde '100mg'$  capsule 4  capsules at bed time,  Andrea [name] has a video. He is going to up load to my chart

## 2022-01-14 NOTE — Telephone Encounter (Signed)
Patients friend [name] called and stated Andrea [name] went to the ED Sat due to having 2 seizures.  She had 2 more today.  After both times her blood pressure is low.  He does not want to leave her in case she has another one.

## 2022-01-15 NOTE — Telephone Encounter (Signed)
Left a message to work in with Dr. [name], per MD.

## 2022-01-16 ENCOUNTER — Ambulatory Visit (INDEPENDENT_AMBULATORY_CARE_PROVIDER_SITE_OTHER): Payer: No Typology Code available for payment source | Admitting: Neurology

## 2022-01-16 ENCOUNTER — Encounter: Payer: Self-pay | Admitting: Neurology

## 2022-01-16 VITALS — BP 120/76 | HR 68 | Ht 72.0 in | Wt 298.0 lb

## 2022-01-16 DIAGNOSIS — G43009 Migraine without aura, not intractable, without status migrainosus: Secondary | ICD-10-CM

## 2022-01-16 DIAGNOSIS — F445 Conversion disorder with seizures or convulsions: Secondary | ICD-10-CM | POA: Diagnosis not present

## 2022-01-16 MED ORDER — GABAPENTIN 300 MG PO CAPS: ORAL_CAPSULE | ORAL | 11 refills | Status: DC

## 2022-01-16 MED ORDER — ZONISAMIDE 100 MG PO CAPS: ORAL_CAPSULE | ORAL | 6 refills | Status: DC

## 2022-01-16 NOTE — Progress Notes (Signed)
NEUROLOGY FOLLOW UP OFFICE NOTE  Andrea [name] [id number] 1980-07-21  HISTORY OF PRESENT ILLNESS: I had the pleasure of seeing Andrea [name] in follow-up in the neurology clinic on 01/16/2022.  The patient was last seen 3 months ago for seizures. She is again accompanied by her husband [name] who helps supplement the history today.  Records and images were personally reviewed where available. Since her last visit, she was admitted for EMU monitoring from June 12-16, 2023 where Gabapentin and Zonisamide were held. Baseline EEG was normal, typical events were not captured. Diagnosis of migraine with aura versus nonepileptic events were discussed with them. She did well event-free for 3 months until 7/29 when she had 2 seizures followed by chest pressure, so they went to the ER. BP was 95/56, EKG showed sinus rhythm, troponin negative. Since then, she has had 2 on 7/31, and another 1.5 hours ago. She feels really tired. No tongue bite or incontinence. Her husband shows 2 videos of her typical events. During one, she is lying on the bed with eyes closed, head bobbing up and down with irregular upper body movements, heavy breathing, unresponsive to husband afterwards. Another video shows her with her head bent down, her husband notes she started staring, then head slumped down and she started having side to side head movements as he lay her on the bed with upper body rocking, eyes closed. She continues to have migraines 3-4 times a week. She recalls trying triptans in the past, she recalls trying Imitrex. She has not seen her psychiatrist and therapist for a while now, her PCP has been prescribing the Olanzapine, Prozac, and Doxepin. She denies any prior history of trauma. She has a diagnosis of bipolar disorder.    History on Initial Assessment 08/08/2021: This is a pleasant 40 year old right-handed woman with a history of hypertension, DM, migraines, bipolar disorder, presenting for evaluation of new  onset seizures. She is accompanied by her husband who helps supplement the history today. She was in her usual state of health until 07/31/21 while at work at [employer], she started feeling a little funny and sat down, then woke up in the hospital. Co-workers reported she got went, went "pale white," then slid down to the floor and had a 4-minute episode of jerking with EMS noting post-ictal confusion. No tongue bite or incontinence. In the ER, her husband reports she had 2 more seizures, but ER notes indicate there was an episode of upper body jerking including head jerking lasting 15-20 seconds. Bloodwork showed creatinine 1.21, glucose 265. I personally reviewed MRI brain with and without contrast which was normal, hippocampi symmetric with no abnormal signal or enhancement seen. Her wake and drowsy EEG was normal. She had been on Gabapentin for anxiety, dose was increased to '300mg'$  TID. She feels her left side has been weaker since then, this has gotten better. Since hospital discharge, her husband reports more seizures. She had an unwitnessed one on 2/16 where she recalls going to the bathroom and coming out, then waking up on the ground, no injuries/tongue bite/incontinence. There was no prior warning. On 2/17, after eating a snack, she told her husband she felt funny and lay down, she then had another shaking episode lasting less than a minute. The last episode was on 2/18, they were dozing in bed, he felt her shaking and saw her lying on her left side. He thinks her eyes were closed, mouth open, it lasted less than 30 seconds, she woke up, then  had another 30-second shaking episode. She had no memory of it and told her husband her head hurt and she felt very tired. Her husband denies any staring episodes. She denies any olfactory/gustatory hallucinations, deja vu, rising epigastric sensation, focal numbness/tingling. She has occasional jerks in her legs. She used to have migraines that quieted down. She used to take  Topamax but it interacted with one of her medications. Since the initial seizure, she has had frontal throbbing headaches that recur, different from past headaches. She is sensitive to lights/sounds, no nausea/vomiting. Tylenol helps sometimes but lately has not, she has been taking 2 '650mg'$  tabs three times a day for the past week. She denies any diplopia, dysarthria/dysphagia, neck/back pain, bowel/bladder dysfunction. Her husband tried to check her glucose level with the last seizure, it was 271. She denies any sleep deprivation, she gets 6-8 hours of sleep with Doxepin. No recent change in stress levels. She is on Zyprexa for mood, mood is good. She is not having a lot of anxiety anymore. She lives with her husband and works as a Retail banker at [employer]. No alcohol use. She had a normal birth and early development.  There is no history of febrile convulsions, CNS infections such as meningitis/encephalitis, significant traumatic brain injury, neurosurgical procedures, or family history of seizures.  Prior ASMs: Topiramate  PAST MEDICAL HISTORY: Past Medical History:  Diagnosis Date   Anxiety    Bipolar 1 disorder ([place])    Complication of anesthesia    hard time waking up    Depression    Diabetes mellitus without complication (HCC)    GERD (gastroesophageal reflux disease)    no meds   Kidney stones    Low back pain    Migraines    PCOS (polycystic ovarian syndrome)    PONV (postoperative nausea and vomiting)    Schizophrenia (HCC)    Shortness of breath dyspnea    with bronchitis    MEDICATIONS: Current Outpatient Medications on File Prior to Visit  Medication Sig Dispense Refill   albuterol (VENTOLIN HFA) 108 (90 Base) MCG/ACT inhaler Inhale 1-2 puffs into the lungs every 6 (six) hours as needed for wheezing or shortness of breath. 18 g 0   diphenhydrAMINE (BENADRYL) 25 MG tablet Take 25 mg by mouth 3 (three) times daily as needed for itching (with each dose of  hydrocodone/apap).     doxepin (SINEQUAN) 50 MG capsule Take 50 mg by mouth at bedtime.     empagliflozin (JARDIANCE) 25 MG TABS tablet Take 25 mg by mouth every morning.     FLUoxetine (PROZAC) 20 MG capsule Take 1 capsule (20 mg total) by mouth daily. (Patient taking differently: Take 60 mg by mouth every morning.) 90 capsule 0   gabapentin (NEURONTIN) 300 MG capsule Take 1 capsule (300 mg total) by mouth 3 (three) times daily. (Patient taking differently: Take 300 mg by mouth 2 (two) times daily.) 90 capsule 0   HYDROcodone-acetaminophen (NORCO) 7.5-325 MG tablet Take 1 tablet by mouth 3 (three) times daily as needed (pain). Take with benadryl     insulin degludec (TRESIBA FLEXTOUCH) 100 UNIT/ML FlexTouch Pen Inject 20 Units into the skin 2 (two) times daily with a meal.     levocetirizine (XYZAL) 5 MG tablet Take 5 mg by mouth at bedtime.     lisinopril-hydrochlorothiazide (ZESTORETIC) 20-25 MG tablet Take 1 tablet by mouth every morning.     loteprednol (LOTEMAX) 0.5 % ophthalmic suspension Place 1 drop into the left  eye 4 (four) times daily.     meloxicam (MOBIC) 15 MG tablet Take 15 mg by mouth every morning.     metoprolol succinate (TOPROL-XL) 25 MG 24 hr tablet Take 25 mg by mouth every morning.     OLANZapine (ZYPREXA) 10 MG tablet Take 10 mg by mouth in the morning and at bedtime.     oxybutynin (DITROPAN-XL) 5 MG 24 hr tablet Take one tablet twice daily (Patient taking differently: Take 5 mg by mouth 2 (two) times daily.) 60 tablet 2   pantoprazole (PROTONIX) 40 MG tablet Take 40 mg by mouth every morning.     Semaglutide (RYBELSUS) 14 MG TABS Take 14 mg by mouth every morning.     tiZANidine (ZANAFLEX) 4 MG tablet Take 4 mg by mouth every 8 (eight) hours as needed for muscle spasms.     zonisamide (ZONEGRAN) 100 MG capsule Take 4 capsules every night (Patient taking differently: 400 mg at bedtime.) 120 capsule 6   [DISCONTINUED] fluticasone (FLOVENT HFA) 110 MCG/ACT inhaler Inhale 2  puffs into the lungs 2 (two) times daily. For shortness of breath (Patient not taking: Reported on 09/26/2020) 3 Inhaler 0   [DISCONTINUED] metFORMIN (GLUCOPHAGE) 500 MG tablet Take 1 tablet (500 mg total) by mouth 2 (two) times daily with a meal. (Patient not taking: No sig reported) 60 tablet 1   [DISCONTINUED] trazodone (DESYREL) 300 MG tablet Take 300 mg by mouth at bedtime. (Patient not taking: Reported on 09/26/2020)     No current facility-administered medications on file prior to visit.    ALLERGIES: Allergies  Allergen Reactions   Bactrim [Sulfamethoxazole-Trimethoprim] Hives and Itching   Codeine Hives and Itching   Hydrocodone-Acetaminophen Itching    Can take with benadryl    FAMILY HISTORY: Family History  Problem Relation Age of Onset   Healthy Mother    Healthy Father     SOCIAL HISTORY: Social History   Socioeconomic History   Marital status: Divorced    Spouse name: Not on file   Number of children: 0   Years of education: 12   Highest education level: Not on file  Occupational History   Not on file  Tobacco Use   Smoking status: Never   Smokeless tobacco: Never  Vaping Use   Vaping Use: Never used  Substance and Sexual Activity   Alcohol use: No   Drug use: No   Sexual activity: Never  Other Topics Concern   Not on file  Social History Narrative   Right handed   Drinks caffeine   One story home   Social Determinants of Health   Financial Resource Strain: Not on file  Food Insecurity: Not on file  Transportation Needs: Not on file  Physical Activity: Not on file  Stress: Not on file  Social Connections: Not on file  Intimate Partner Violence: Not on file     PHYSICAL EXAM: Vitals:   01/16/22 1308  BP: 120/76  Pulse: 68  SpO2: 96%   General: No acute distress, flat affect Head:  Normocephalic/atraumatic Skin/Extremities: No rash, no edema Neurological Exam: alert and awake. No aphasia or dysarthria. Fund of knowledge is  appropriate.  Attention and concentration are normal.   Cranial nerves: Pupils equal, round. Extraocular movements intact. Visual fields full.  No facial asymmetry.  Motor: Bulk and tone normal, muscle strength 5/5 throughout with no pronator drift.   Finger to nose testing intact.  Gait narrow-based and steady, able to tandem walk adequately.  Romberg negative.  IMPRESSION: This is a pleasant 41 yo RH woman with a history of hypertension, DM, migraines, bipolar disorder, with new onset seizures since February 2023. She would have staring followed by shaking. Her 48-hour EEG and 5-day EMU admission showed normal baseline EEG, however typical events were not captured. Her husband brings 2 videos today which were reviewed and consistent with psychogenic non-epileptic events. The diagnosis was discussed at length with the patient and her husband. We discussed CBT and re-establishing care with Psychiatry. She continues to report 3-4 migraines a month, we discussed using the Zonisamide and Gabapentin for migraine prophylaxis, increase Gabapentin to 300-300-'600mg'$ , continue Zonisamide '400mg'$  qhs. She was given samples for Nurtec and Ubrelvy for migraine rescue, side effects discussed, she will let us know which is helpful so we can send refills. [place] driving laws discussed, no driving after an episode of loss of consciousness/awareness, until 6 months event-free. Follow-up in 3 months, call for any changes.     Thank you for allowing me to participate in her care.  Please do not hesitate to call for any questions or concerns.    Ellouise Newer, M.D.   CC: Bernardo Heater, FNP

## 2022-01-16 NOTE — Telephone Encounter (Signed)
Scheduled for 8/2.

## 2022-01-16 NOTE — Patient Instructions (Addendum)
Good to see you.  Increase Gabapentin '300mg'$ : take 1 capsule in AM, 1 capsule at noon, 2 capsules at bedtime  2. Continue Zonisamide '400mg'$  every night  3. Try samples of Nurtec and Ubrelvy for migraine rescue. Let me know which is helpful and I will send in refills.  4. Referral will be sent to Psychiatry. Please start Cognitive Behavioral Therapy  5. The website [email or web address].org is very helpful [email or web address]/en/treatment/treatment-[email or web address]-[email or web address]  6. Follow-up in 3 months, call for any changes

## 2022-02-04 ENCOUNTER — Encounter: Payer: Self-pay | Admitting: Neurology

## 2022-02-04 ENCOUNTER — Ambulatory Visit
Admission: EM | Admit: 2022-02-04 | Discharge: 2022-02-04 | Disposition: A | Discharge status: Home / Self Care | Payer: No Typology Code available for payment source | Attending: Family Medicine | Admitting: Family Medicine

## 2022-02-04 DIAGNOSIS — N390 Urinary tract infection, site not specified: Secondary | ICD-10-CM | POA: Diagnosis present

## 2022-02-04 LAB — POCT URINALYSIS DIP (MANUAL ENTRY)
Blood, UA: NEGATIVE
Glucose, UA: >=1000 mg/dL — AB
Leukocytes, UA: NEGATIVE
Nitrite, UA: POSITIVE — AB
Protein Ur, POC: 30 mg/dL — AB
Spec Grav, UA: 1.02 (ref 1.010–1.025)
Urobilinogen, UA: 1 E.U./dL
pH, UA: 5.5 (ref 5.0–8.0)

## 2022-02-04 MED ORDER — CEPHALEXIN 500 MG PO CAPS: 500.0000 mg | ORAL_CAPSULE | Freq: Two times a day (BID) | ORAL | 0 refills | Status: DC

## 2022-02-06 LAB — URINE CULTURE: Culture: 80000 — AB

## 2022-02-06 MED ORDER — SUMATRIPTAN SUCCINATE 100 MG PO TABS: ORAL_TABLET | ORAL | 11 refills | Status: DC

## 2022-02-08 NOTE — ED Provider Notes (Signed)
[facility]    CSN: [id number] Arrival date & time: 02/04/22  1851      History   Chief Complaint Chief Complaint  Patient presents with   Dysuria    HPI Andrea [name] is a 41 y.o. female.   Patient presenting today with several day history of dysuria, urinary frequency.  Denies fever, chills, abdominal or pelvic pain, vaginal symptoms, nausea, vomiting.  Trying to increase fluids with minimal relief, taking Azo.  History of urinary tract infections.    Past Medical History:  Diagnosis Date   Anxiety    Bipolar 1 disorder ([place])    Complication of anesthesia    hard time waking up    Depression    Diabetes mellitus without complication ([place])    GERD (gastroesophageal reflux disease)    no meds   Kidney stones    Low back pain    Migraines    PCOS (polycystic ovarian syndrome)    PONV (postoperative nausea and vomiting)    Schizophrenia ([place])    Shortness of breath dyspnea    with bronchitis    Patient Active Problem List   Diagnosis Date Noted   Convulsion ([place]) 11/26/2021   Seizure ([place]) 10/03/2021   Irregular periods 09/26/2020   Mild persistent asthma with acute exacerbation 06/18/2017   Bipolar I disorder, most recent episode depressed ([place]) 12/20/2016   Bipolar 1 disorder, mixed, severe ([place]) 12/20/2016   Uncontrolled type 2 diabetes mellitus with hyperglycemia, with long-term current use of insulin ([place]) 09/18/2016   HTN (hypertension) 09/18/2016   Seizures ([place]) 06/04/2016   Pelvic pain in female 11/14/2015   Acute pyelonephritis 01/16/2013   Migraines 10/30/2012   Obesity, Class III, BMI 40-49.9 (morbid obesity) ([place]) 10/30/2012   Kidney stones     Past Surgical History:  Procedure Laterality Date   ABDOMINAL HYSTERECTOMY N/A 11/14/2015   Procedure: HYSTERECTOMY ABDOMINAL;  Surgeon: Olga Millers, MD;  Location: [facility];  Service: Gynecology;  Laterality: N/A;   CHOLECYSTECTOMY     INTRAUTERINE DEVICE (IUD) INSERTION   06/14/2014   [facility]   LYMPH NODES REMOVED     ovarian cyst removed     SALPINGOOPHORECTOMY Bilateral 11/14/2015   Procedure: SALPINGO OOPHORECTOMY;  Surgeon: Olga Millers, MD;  Location: [facility];  Service: Gynecology;  Laterality: Bilateral;    OB History   No obstetric history on file.      Home Medications    Prior to Admission medications   Medication Sig Start Date End Date Taking? Authorizing Provider  cephALEXin (KEFLEX) 500 MG capsule Take 1 capsule (500 mg total) by mouth 2 (two) times daily. 02/04/22  Yes Volney American, PA-C  albuterol (VENTOLIN HFA) 108 (90 Base) MCG/ACT inhaler Inhale 1-2 puffs into the lungs every 6 (six) hours as needed for wheezing or shortness of breath. 04/15/21   Jaynee Eagles, PA-C  diphenhydrAMINE (BENADRYL) 25 MG tablet Take 25 mg by mouth 3 (three) times daily as needed for itching (with each dose of hydrocodone/apap).    [provider]  doxepin (SINEQUAN) 50 MG capsule Take 50 mg by mouth at bedtime. 06/04/21   [provider]  empagliflozin (JARDIANCE) 25 MG TABS tablet Take 25 mg by mouth every morning.    [provider]  FLUoxetine (PROZAC) 20 MG capsule Take 1 capsule (20 mg total) by mouth daily. Patient taking differently: Take 60 mg by mouth every morning. 08/14/17   Kathyrn Drown, MD  gabapentin (NEURONTIN) 300  MG capsule Take 1 capsule in AM, 1 capsule at noon, 2 capsules in PM 01/16/22   Cameron Sprang, MD  HYDROcodone-acetaminophen (NORCO) 7.5-325 MG tablet Take 1 tablet by mouth 3 (three) times daily as needed (pain). Take with benadryl 09/20/21   [provider]  insulin degludec (TRESIBA FLEXTOUCH) 100 UNIT/ML FlexTouch Pen Inject 20 Units into the skin 2 (two) times daily with a meal.    [provider]  levocetirizine (XYZAL) 5 MG tablet Take 5 mg by mouth at bedtime.    [provider]  lisinopril-hydrochlorothiazide (ZESTORETIC) 20-25 MG tablet Take 1 tablet  by mouth every morning. Patient not taking: Reported on 01/16/2022    [provider]  loteprednol (LOTEMAX) 0.5 % ophthalmic suspension Place 1 drop into the left eye 4 (four) times daily. Patient not taking: Reported on 01/16/2022 11/21/21   [provider]  meloxicam (MOBIC) 15 MG tablet Take 15 mg by mouth every morning. 08/30/21   [provider]  metoprolol succinate (TOPROL-XL) 25 MG 24 hr tablet Take 25 mg by mouth every morning. Patient not taking: Reported on 01/16/2022 08/17/20   [provider]  OLANZapine (ZYPREXA) 10 MG tablet Take 10 mg by mouth in the morning and at bedtime.    [provider]  oxybutynin (DITROPAN-XL) 5 MG 24 hr tablet Take one tablet twice daily Patient taking differently: Take 5 mg by mouth 2 (two) times daily. 09/04/17   Kathyrn Drown, MD  pantoprazole (PROTONIX) 40 MG tablet Take 40 mg by mouth every morning. 08/31/20   [provider]  Semaglutide (RYBELSUS) 14 MG TABS Take 14 mg by mouth every morning.    [provider]  SUMAtriptan (IMITREX) 100 MG tablet Take 1 tablet at onset of migraine. May May repeat in 2 hours if headache persists or recurs. Do not take more than 3 a week 02/06/22   Cameron Sprang, MD  tiZANidine (ZANAFLEX) 4 MG tablet Take 4 mg by mouth every 8 (eight) hours as needed for muscle spasms.    [provider]  zonisamide (ZONEGRAN) 100 MG capsule Take 4 capsules every night 01/16/22   Cameron Sprang, MD  fluticasone (FLOVENT HFA) 110 MCG/ACT inhaler Inhale 2 puffs into the lungs 2 (two) times daily. For shortness of breath Patient not taking: Reported on 09/26/2020 12/24/16 10/27/20  Lindell Spar I, NP  metFORMIN (GLUCOPHAGE) 500 MG tablet Take 1 tablet (500 mg total) by mouth 2 (two) times daily with a meal. Patient not taking: No sig reported 01/01/20 10/27/20  Noemi Chapel, MD  trazodone (DESYREL) 300 MG tablet Take 300 mg by mouth at bedtime. Patient not taking: Reported on  09/26/2020  10/27/20  [provider]    Family History Family History  Problem Relation Age of Onset   Healthy Mother    Healthy Father     Social History Social History   Tobacco Use   Smoking status: Never   Smokeless tobacco: Never  Vaping Use   Vaping Use: Never used  Substance Use Topics   Alcohol use: No   Drug use: No     Allergies   Bactrim [sulfamethoxazole-trimethoprim], Codeine, and Hydrocodone-acetaminophen   Review of Systems Review of Systems Per HPI  Physical Exam Triage Vital Signs ED Triage Vitals [02/04/22 1912]  Enc Vitals Group     BP (!) 147/88     Pulse Rate 69     Resp 16     Temp 98.5 F (36.9  C)     Temp Source Oral     SpO2 94 %     Weight      Height      Head Circumference      Peak Flow      Pain Score      Pain Loc      Pain Edu?      Excl. in [place]?    No data found.  Updated Vital Signs BP (!) 147/88 (BP Location: Right Arm)   Pulse 69   Temp 98.5 F (36.9 C) (Oral)   Resp 16   LMP  (LMP Unknown)   SpO2 94%   Visual Acuity Right Eye Distance:   Left Eye Distance:   Bilateral Distance:    Right Eye Near:   Left Eye Near:    Bilateral Near:     Physical Exam Vitals and nursing note reviewed.  Constitutional:      Appearance: Normal appearance. She is not ill-appearing.  HENT:     Head: Atraumatic.  Eyes:     Extraocular Movements: Extraocular movements intact.     Conjunctiva/sclera: Conjunctivae normal.  Cardiovascular:     Rate and Rhythm: Normal rate and regular rhythm.     Heart sounds: Normal heart sounds.  Pulmonary:     Effort: Pulmonary effort is normal.     Breath sounds: Normal breath sounds.  Abdominal:     General: Bowel sounds are normal. There is no distension.     Palpations: Abdomen is soft.     Tenderness: There is no abdominal tenderness. There is no right CVA tenderness, left CVA tenderness or guarding.  Musculoskeletal:        General: Normal range of motion.      Cervical back: Normal range of motion and neck supple.  Skin:    General: Skin is warm and dry.  Neurological:     Mental Status: She is alert and oriented to person, place, and time.  Psychiatric:        Mood and Affect: Mood normal.        Thought Content: Thought content normal.        Judgment: Judgment normal.      UC Treatments / Results  Labs (all labs ordered are listed, but only abnormal results are displayed) Labs Reviewed  URINE CULTURE - Abnormal; Notable for the following components:      Result Value   Culture   (*)    Value: 80,000 COLONIES/mL LACTOBACILLUS SPECIES Standardized susceptibility testing for this organism is not available. Performed at [facility], [place] [address] [place], [place] [address]    All other components within normal limits  POCT URINALYSIS DIP (MANUAL ENTRY) - Abnormal; Notable for the following components:   Glucose, UA >=1,000 (*)    Bilirubin, UA small (*)    Ketones, POC UA trace (5) (*)    Protein Ur, POC =30 (*)    Nitrite, UA Positive (*)    All other components within normal limits    EKG   Radiology No results found.  Procedures Procedures (including critical care time)  Medications Ordered in UC Medications - No data to display  Initial Impression / Assessment and Plan / UC Course  I have reviewed the triage vital signs and the nursing notes.  Pertinent labs & imaging results that were available during my care of the patient were reviewed by me and considered in my medical decision making (see chart for details).  Urinalysis showing possible evidence of urinary tract infection, treat with Keflex while awaiting urine culture results.  Return for worsening symptoms.  Final Clinical Impressions(s) / UC Diagnoses   Final diagnoses:  Acute lower UTI   Discharge Instructions   None    ED Prescriptions     Medication Sig Dispense Auth. Provider   cephALEXin (KEFLEX) 500 MG capsule Take 1 capsule  (500 mg total) by mouth 2 (two) times daily. 10 capsule Volney American, [place]      PDMP not reviewed this encounter.   Volney American, [place] 02/08/22 1942

## 2022-02-20 ENCOUNTER — Ambulatory Visit (HOSPITAL_COMMUNITY): Payer: No Typology Code available for payment source | Admitting: Psychiatry

## 2022-03-03 ENCOUNTER — Encounter: Payer: Self-pay | Admitting: Emergency Medicine

## 2022-03-03 ENCOUNTER — Ambulatory Visit
Admission: EM | Admit: 2022-03-03 | Discharge: 2022-03-03 | Disposition: A | Discharge status: Home / Self Care | Payer: No Typology Code available for payment source | Attending: Nurse Practitioner | Admitting: Nurse Practitioner

## 2022-03-03 DIAGNOSIS — J029 Acute pharyngitis, unspecified: Secondary | ICD-10-CM | POA: Diagnosis present

## 2022-03-03 DIAGNOSIS — H65193 Other acute nonsuppurative otitis media, bilateral: Secondary | ICD-10-CM | POA: Insufficient documentation

## 2022-03-03 LAB — POCT RAPID STREP A (OFFICE): Rapid Strep A Screen: NEGATIVE

## 2022-03-03 MED ORDER — FLUTICASONE PROPIONATE 50 MCG/ACT NA SUSP: 1.0000 | Freq: Every day | NASAL | 0 refills | Status: DC

## 2022-03-03 NOTE — ED Triage Notes (Signed)
Bilateral ear pain, sore throat, some nasal congestion since Thursday.  States glands in neck are tender to the touch.

## 2022-03-03 NOTE — ED Provider Notes (Signed)
[facility]    CSN: [id number] Arrival date & time: 03/03/22  1010      History   Chief Complaint No chief complaint on file.   HPI Andrea [name] is a 41 y.o. female.   Patient presents with 3 days of nasal congestion, postnasal drainage, sneezing, sore throat, dry cough, and swollen glands.  Also endorses bilateral ear pain without drainage.  Has been more tired than normal.  She denies significant fever, body aches, or chills, shortness of breath/wheezing, chest pain or tightness, chest congestion, runny nose, tooth pain, abdominal pain, nausea/vomiting, diarrhea, and decreased appetite.  No new rash.  She has taken Tylenol which does help with the pain temporarily.  She has done multiple COVID-19 test at home have been negative.    Past Medical History:  Diagnosis Date   Anxiety    Bipolar 1 disorder ([place])    Complication of anesthesia    hard time waking up    Depression    Diabetes mellitus without complication ([place])    GERD (gastroesophageal reflux disease)    no meds   Kidney stones    Low back pain    Migraines    PCOS (polycystic ovarian syndrome)    PONV (postoperative nausea and vomiting)    Schizophrenia ([place])    Shortness of breath dyspnea    with bronchitis    Patient Active Problem List   Diagnosis Date Noted   Convulsion ([place]) 11/26/2021   Seizure ([place]) 10/03/2021   Irregular periods 09/26/2020   Mild persistent asthma with acute exacerbation 06/18/2017   Bipolar I disorder, most recent episode depressed ([place]) 12/20/2016   Bipolar 1 disorder, mixed, severe ([place]) 12/20/2016   Uncontrolled type 2 diabetes mellitus with hyperglycemia, with long-term current use of insulin ([place]) 09/18/2016   HTN (hypertension) 09/18/2016   Seizures ([place]) 06/04/2016   Pelvic pain in female 11/14/2015   Acute pyelonephritis 01/16/2013   Migraines 10/30/2012   Obesity, Class III, BMI 40-49.9 (morbid obesity) ([place]) 10/30/2012   Kidney stones     Past  Surgical History:  Procedure Laterality Date   ABDOMINAL HYSTERECTOMY N/A 11/14/2015   Procedure: HYSTERECTOMY ABDOMINAL;  Surgeon: Olga Millers, MD;  Location: [facility];  Service: Gynecology;  Laterality: N/A;   CHOLECYSTECTOMY     INTRAUTERINE DEVICE (IUD) INSERTION  06/14/2014   [facility]   LYMPH NODES REMOVED     ovarian cyst removed     SALPINGOOPHORECTOMY Bilateral 11/14/2015   Procedure: SALPINGO OOPHORECTOMY;  Surgeon: Olga Millers, MD;  Location: [facility];  Service: Gynecology;  Laterality: Bilateral;    OB History   No obstetric history on file.      Home Medications    Prior to Admission medications   Medication Sig Start Date End Date Taking? Authorizing Provider  fluticasone (FLONASE) 50 MCG/ACT nasal spray Place 1 spray into both nostrils daily. 03/03/22  Yes Eulogio Bear, NP  albuterol (VENTOLIN HFA) 108 (90 Base) MCG/ACT inhaler Inhale 1-2 puffs into the lungs every 6 (six) hours as needed for wheezing or shortness of breath. 04/15/21   Jaynee Eagles, PA-C  cephALEXin (KEFLEX) 500 MG capsule Take 1 capsule (500 mg total) by mouth 2 (two) times daily. 02/04/22   Volney American, PA-C  diphenhydrAMINE (BENADRYL) 25 MG tablet Take 25 mg by mouth 3 (three) times daily as needed for itching (with each dose of hydrocodone/apap).    [provider]  doxepin (SINEQUAN) 50 MG capsule Take 50  mg by mouth at bedtime. 06/04/21   [provider]  empagliflozin (JARDIANCE) 25 MG TABS tablet Take 25 mg by mouth every morning.    [provider]  FLUoxetine (PROZAC) 20 MG capsule Take 1 capsule (20 mg total) by mouth daily. Patient taking differently: Take 60 mg by mouth every morning. 08/14/17   Kathyrn Drown, MD  gabapentin (NEURONTIN) 300 MG capsule Take 1 capsule in AM, 1 capsule at noon, 2 capsules in PM 01/16/22   Cameron Sprang, MD  HYDROcodone-acetaminophen (NORCO) 7.5-325 MG tablet Take 1 tablet by mouth 3 (three) times daily as  needed (pain). Take with benadryl 09/20/21   [provider]  insulin degludec (TRESIBA FLEXTOUCH) 100 UNIT/ML FlexTouch Pen Inject 20 Units into the skin 2 (two) times daily with a meal.    [provider]  levocetirizine (XYZAL) 5 MG tablet Take 5 mg by mouth at bedtime.    [provider]  lisinopril-hydrochlorothiazide (ZESTORETIC) 20-25 MG tablet Take 1 tablet by mouth every morning. Patient not taking: Reported on 01/16/2022    [provider]  loteprednol (LOTEMAX) 0.5 % ophthalmic suspension Place 1 drop into the left eye 4 (four) times daily. Patient not taking: Reported on 01/16/2022 11/21/21   [provider]  meloxicam (MOBIC) 15 MG tablet Take 15 mg by mouth every morning. 08/30/21   [provider]  metoprolol succinate (TOPROL-XL) 25 MG 24 hr tablet Take 25 mg by mouth every morning. Patient not taking: Reported on 01/16/2022 08/17/20   [provider]  OLANZapine (ZYPREXA) 10 MG tablet Take 10 mg by mouth in the morning and at bedtime.    [provider]  oxybutynin (DITROPAN-XL) 5 MG 24 hr tablet Take one tablet twice daily Patient taking differently: Take 5 mg by mouth 2 (two) times daily. 09/04/17   Kathyrn Drown, MD  pantoprazole (PROTONIX) 40 MG tablet Take 40 mg by mouth every morning. 08/31/20   [provider]  Semaglutide (RYBELSUS) 14 MG TABS Take 14 mg by mouth every morning.    [provider]  SUMAtriptan (IMITREX) 100 MG tablet Take 1 tablet at onset of migraine. May May repeat in 2 hours if headache persists or recurs. Do not take more than 3 a week 02/06/22   Cameron Sprang, MD  tiZANidine (ZANAFLEX) 4 MG tablet Take 4 mg by mouth every 8 (eight) hours as needed for muscle spasms.    [provider]  zonisamide (ZONEGRAN) 100 MG capsule Take 4 capsules every night 01/16/22   Cameron Sprang, MD  fluticasone (FLOVENT HFA) 110 MCG/ACT inhaler Inhale 2 puffs into the lungs 2 (two) times  daily. For shortness of breath Patient not taking: Reported on 09/26/2020 12/24/16 10/27/20  Lindell Spar I, NP  metFORMIN (GLUCOPHAGE) 500 MG tablet Take 1 tablet (500 mg total) by mouth 2 (two) times daily with a meal. Patient not taking: No sig reported 01/01/20 10/27/20  Noemi Chapel, MD  trazodone (DESYREL) 300 MG tablet Take 300 mg by mouth at bedtime. Patient not taking: Reported on 09/26/2020  10/27/20  [provider]    Family History Family History  Problem Relation Age of Onset   Healthy Mother    Healthy Father     Social History Social History   Tobacco Use   Smoking status: Never   Smokeless tobacco: Never  Vaping Use   Vaping Use: Never used  Substance Use Topics   Alcohol use: No   Drug  use: No     Allergies   Bactrim [sulfamethoxazole-trimethoprim], Codeine, and Hydrocodone-acetaminophen   Review of Systems Review of Systems Per HPI  Physical Exam Triage Vital Signs ED Triage Vitals  Enc Vitals Group     BP 03/03/22 1054 124/85     Pulse Rate 03/03/22 1054 87     Resp 03/03/22 1054 18     Temp 03/03/22 1054 97.6 F (36.4 C)     Temp Source 03/03/22 1054 Oral     SpO2 03/03/22 1054 95 %     Weight --      Height --      Head Circumference --      Peak Flow --      Pain Score 03/03/22 1055 7     Pain Loc --      Pain Edu? --      Excl. in [place]? --    No data found.  Updated Vital Signs BP 124/85 (BP Location: Right Arm)   Pulse 87   Temp 97.6 F (36.4 C) (Oral)   Resp 18   LMP  (LMP Unknown)   SpO2 95%   Visual Acuity Right Eye Distance:   Left Eye Distance:   Bilateral Distance:    Right Eye Near:   Left Eye Near:    Bilateral Near:     Physical Exam Vitals and nursing note reviewed.  Constitutional:      General: She is not in acute distress.    Appearance: Normal appearance. She is not ill-appearing or toxic-appearing.  HENT:     Head: Normocephalic and atraumatic.     Right Ear: Ear canal and external ear normal.  A middle ear effusion is present.     Left Ear: Ear canal and external ear normal. A middle ear effusion is present.     Nose: No congestion or rhinorrhea.     Mouth/Throat:     Mouth: Mucous membranes are moist.     Pharynx: Oropharynx is clear. Posterior oropharyngeal erythema present. No oropharyngeal exudate or uvula swelling.     Tonsils: No tonsillar exudate or tonsillar abscesses. 0 on the right. 0 on the left.  Eyes:     General: No scleral icterus.    Extraocular Movements: Extraocular movements intact.  Cardiovascular:     Rate and Rhythm: Normal rate and regular rhythm.  Pulmonary:     Effort: Pulmonary effort is normal. No respiratory distress.     Breath sounds: Normal breath sounds. No wheezing, rhonchi or rales.  Abdominal:     General: Abdomen is flat. Bowel sounds are normal. There is no distension.     Palpations: Abdomen is soft.  Musculoskeletal:     Cervical back: Normal range of motion and neck supple.  Lymphadenopathy:     Cervical: Cervical adenopathy present.  Skin:    General: Skin is warm and dry.     Coloration: Skin is not jaundiced or pale.     Findings: No erythema or rash.  Neurological:     Mental Status: She is alert and oriented to person, place, and time.  Psychiatric:        Behavior: Behavior is cooperative.      UC Treatments / Results  Labs (all labs ordered are listed, but only abnormal results are displayed) Labs Reviewed  CULTURE, GROUP A STREP [facility])  POCT RAPID STREP A (OFFICE)    EKG   Radiology No results found.  Procedures Procedures (including critical care time)  Medications  Ordered in UC Medications - No data to display  Initial Impression / Assessment and Plan / UC Course  I have reviewed the triage vital signs and the nursing notes.  Pertinent labs & imaging results that were available during my care of the patient were reviewed by me and considered in my medical decision making (see chart for details).     Patient is well-appearing, normotensive, afebrile, not tachycardic, not tachypneic, oxygenating well on room air.  Rapid strep throat test today is negative, throat culture pending.  Patient has performed a COVID-19 test at home and declines COVID-19 testing today.  Start Flonase to help with middle ear effusion.  Supportive care discussed for viral symptoms in the meantime.  ER precautions and return precautions discussed.  The patient was given the opportunity to ask questions.  All questions answered to their satisfaction.  The patient is in agreement to this plan.  Final Clinical Impressions(s) / UC Diagnoses   Final diagnoses:  Acute pharyngitis, unspecified etiology  Acute MEE (middle ear effusion), bilateral     Discharge Instructions      Rapid strep throat test is negative today.  We are sending for a throat culture we will call you in a couple of days if this comes back positive.  In the meantime, please start Flonase daily to help with the fluid behind your ears.  Your symptoms should improve over the next week or so. If your symptoms last longer than 10 days without improvement, please follow up with your primary care provider.  If your symptoms, worsen, please go to the Emergency Room.    Some things that can make you feel better are: - Increased rest - Increasing fluid with water/sugar free electrolytes - Acetaminophen and ibuprofen as needed for fever/pain.  - Salt water gargling, chloraseptic spray and throat lozenges - OTC guaifenesin (Mucinex).  - Saline sinus flushes or a neti pot.  - Humidifying the air.      ED Prescriptions     Medication Sig Dispense Auth. Provider   fluticasone (FLONASE) 50 MCG/ACT nasal spray Place 1 spray into both nostrils daily. 16 g Eulogio Bear, NP      PDMP not reviewed this encounter.   Eulogio Bear, NP 03/03/22 1151

## 2022-03-03 NOTE — Discharge Instructions (Addendum)
Rapid strep throat test is negative today.  We are sending for a throat culture we will call you in a couple of days if this comes back positive.  In the meantime, please start Flonase daily to help with the fluid behind your ears.  Your symptoms should improve over the next week or so. If your symptoms last longer than 10 days without improvement, please follow up with your primary care provider.  If your symptoms, worsen, please go to the Emergency Room.    Some things that can make you feel better are: - Increased rest - Increasing fluid with water/sugar free electrolytes - Acetaminophen and ibuprofen as needed for fever/pain.  - Salt water gargling, chloraseptic spray and throat lozenges - OTC guaifenesin (Mucinex).  - Saline sinus flushes or a neti pot.  - Humidifying the air.

## 2022-03-06 ENCOUNTER — Telehealth (HOSPITAL_COMMUNITY): Payer: No Typology Code available for payment source | Admitting: Psychiatry

## 2022-03-06 LAB — CULTURE, GROUP A STREP (THRC)

## 2022-03-13 ENCOUNTER — Encounter (HOSPITAL_COMMUNITY): Payer: Self-pay | Admitting: Psychiatry

## 2022-03-13 ENCOUNTER — Telehealth (INDEPENDENT_AMBULATORY_CARE_PROVIDER_SITE_OTHER): Payer: No Typology Code available for payment source | Admitting: Psychiatry

## 2022-03-13 DIAGNOSIS — G4709 Other insomnia: Secondary | ICD-10-CM

## 2022-03-13 DIAGNOSIS — G43009 Migraine without aura, not intractable, without status migrainosus: Secondary | ICD-10-CM

## 2022-03-13 DIAGNOSIS — Z79899 Other long term (current) drug therapy: Secondary | ICD-10-CM

## 2022-03-13 DIAGNOSIS — F4312 Post-traumatic stress disorder, chronic: Secondary | ICD-10-CM | POA: Insufficient documentation

## 2022-03-13 DIAGNOSIS — F410 Panic disorder [episodic paroxysmal anxiety] without agoraphobia: Secondary | ICD-10-CM

## 2022-03-13 DIAGNOSIS — F411 Generalized anxiety disorder: Secondary | ICD-10-CM

## 2022-03-13 DIAGNOSIS — F4310 Post-traumatic stress disorder, unspecified: Secondary | ICD-10-CM | POA: Diagnosis not present

## 2022-03-13 DIAGNOSIS — F445 Conversion disorder with seizures or convulsions: Secondary | ICD-10-CM

## 2022-03-13 DIAGNOSIS — F331 Major depressive disorder, recurrent, moderate: Secondary | ICD-10-CM | POA: Insufficient documentation

## 2022-03-13 DIAGNOSIS — F332 Major depressive disorder, recurrent severe without psychotic features: Secondary | ICD-10-CM

## 2022-03-13 HISTORY — DX: Other long term (current) drug therapy: Z79.899

## 2022-03-13 MED ORDER — PRAZOSIN HCL 1 MG PO CAPS: 1.0000 mg | ORAL_CAPSULE | Freq: Every day | ORAL | 0 refills | Status: DC

## 2022-03-13 MED ORDER — DOXEPIN HCL 10 MG PO CAPS: ORAL_CAPSULE | ORAL | 0 refills | Status: DC

## 2022-03-13 NOTE — Patient Instructions (Signed)
We changed your medication today. Decrease your doxepin to '30mg'$  (3 pills of the new '10mg'$  prescription) nightly for one week, then '20mg'$  (2 pills of the new '10mg'$  prescription) nightly for one week and we will meet again to see how this is going. We added prazosin '1mg'$  nightly to your regimen. Be on the lookout for feeling dizziness or lightheaded as this medication can lower your blood pressure. It should also help with the nightmares. Dr. Nehemiah Settle has placed a referral for CBT which is a form of psychotherapy can be helpful for depression, anxiety, and trauma. If you are able to get a fasting blood draw at the [facility] to check your lipids (cholesterol, triglycerides) Dr. Nehemiah Settle has placed an order for this. Dr. Nehemiah Settle will also reach out to your PCP about getting a sleep study done to rule out sleep apnea.
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
## 2022-03-14 ENCOUNTER — Telehealth (HOSPITAL_COMMUNITY): Payer: Self-pay

## 2022-03-14 DIAGNOSIS — F410 Panic disorder [episodic paroxysmal anxiety] without agoraphobia: Secondary | ICD-10-CM

## 2022-03-14 NOTE — Telephone Encounter (Addendum)
Pt called in wanting to know if something could be prescribed for her panic attacks and anxiety, states that she was seen yesterday. Pt uses [facility] in [place]. Please advise   After discussion with patient's neurologist, will add gabapentin '100mg'$  bid prn for panic to her regimen. Patient called and informed of change.

## 2022-03-18 MED ORDER — GABAPENTIN 100 MG PO CAPS: 100.0000 mg | ORAL_CAPSULE | Freq: Two times a day (BID) | ORAL | 0 refills | Status: DC | PRN

## 2022-03-19 NOTE — Telephone Encounter (Signed)
Patient was informed and informed staff that provider already called her in a script for her situation.

## 2022-03-25 ENCOUNTER — Encounter (HOSPITAL_COMMUNITY): Payer: Self-pay

## 2022-03-25 ENCOUNTER — Ambulatory Visit (INDEPENDENT_AMBULATORY_CARE_PROVIDER_SITE_OTHER): Payer: No Typology Code available for payment source | Admitting: Clinical

## 2022-03-25 DIAGNOSIS — F332 Major depressive disorder, recurrent severe without psychotic features: Secondary | ICD-10-CM

## 2022-03-25 DIAGNOSIS — F411 Generalized anxiety disorder: Secondary | ICD-10-CM

## 2022-03-25 DIAGNOSIS — F4310 Post-traumatic stress disorder, unspecified: Secondary | ICD-10-CM

## 2022-03-25 DIAGNOSIS — F410 Panic disorder [episodic paroxysmal anxiety] without agoraphobia: Secondary | ICD-10-CM | POA: Diagnosis not present

## 2022-03-25 NOTE — Plan of Care (Signed)
Verbal consent  

## 2022-03-25 NOTE — Progress Notes (Signed)
Virtual Visit via Video Note  I connected with Andrea [name] on 03/25/22 at 10:00 AM EDT by a video enabled telemedicine application and verified that I am speaking with the correct person using two identifiers.  Location: Patient: Home Provider: Office   I discussed the limitations of evaluation and management by telemedicine and the availability of in person appointments. The patient expressed understanding and agreed to proceed.     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 No data recorded Referral name: No data recorded Referral phone number: No data recorded  Whom do you see for routine medical problems? No data recorded Practice/Facility Name: No data recorded Practice/Facility Phone Number: No data recorded Name of Contact: No data recorded Contact Number: No data recorded Contact Fax Number: No data recorded Prescriber Name: No data recorded Prescriber Address (if known): No data recorded  What Is the Reason for Your Visit/Call Today? No data recorded How Long Has This Been Causing You Problems? No data recorded What Do You Feel Would Help You the Most Today? No data recorded  Have You Recently Been in Any Inpatient Treatment (Hospital/Detox/Crisis Center/28-Day Program)? No data recorded Name/Location of Program/Hospital:No data recorded How Long Were You There? No data recorded When Were You Discharged? No data recorded  Have You Ever Received Services From [facility] Before? No data recorded Who Do You See at [facility]? No data recorded  Have You Recently Had Any Thoughts About Hurting Yourself? No data recorded Are You Planning to Commit Suicide/Harm Yourself At This time? No data  recorded  Have you Recently Had Thoughts About [place]? No data recorded Explanation: No data recorded  Have You Used Any Alcohol or Drugs in the Past 24 Hours? No data recorded How Long Ago Did You Use Drugs or Alcohol? No data recorded What Did You Use and How Much? No data recorded  Do You Currently Have a Therapist/Psychiatrist? No data recorded Name of Therapist/Psychiatrist: No data recorded  Have You Been Recently Discharged From Any Office Practice or Programs? No data recorded Explanation of Discharge From Practice/Program: No data recorded    CCA Screening Triage Referral Assessment Type of Contact: No data recorded Is this Initial or Reassessment? No data recorded Date Telepsych consult ordered in [facility]:  No data recorded Time Telepsych consult ordered in [facility]:  No data recorded  Patient Reported Information Reviewed? No data recorded Patient Left Without Being Seen? No data recorded Reason for Not Completing Assessment: No data recorded  Collateral Involvement: No data recorded  Does Patient Have a [place]? No data recorded Name and Contact of Legal Guardian: No data recorded If Minor and Not Living with Parent(s), Who has Custody? No data recorded Is CPS involved or ever been involved? No data recorded Is APS involved or ever been involved? No data recorded  Patient Determined To Be At Risk for Harm To Self or Others Based on Review of Patient Reported Information or Presenting Complaint? No data recorded Method: No data recorded Availability of Means: No data recorded Intent: No data recorded Notification Required: No data recorded Additional Information for Danger to Others Potential: No data recorded Additional Comments for Danger to Others Potential: No data recorded Are There Guns or Other  Weapons in [place]? No data recorded Types of Guns/Weapons: No data recorded Are These Weapons Safely Secured?                             No data recorded Who Could Verify You Are Able To Have These Secured: No data recorded Do You Have any Outstanding Charges, Pending Court Dates, Parole/Probation? No data recorded Contacted To Inform of Risk of Harm To Self or Others: No data recorded  Location of Assessment: No data recorded  Does Patient Present under Involuntary Commitment? No data recorded IVC Papers Initial File Date: No data recorded  [place] of Residence: No data recorded  Patient Currently Receiving the Following Services: No data recorded  Determination of Need: No data recorded  Options For Referral: No data recorded    CCA Biopsychosocial Intake/Chief Complaint:  The patient was reffered by Dr. Nehemiah Settle for med management with pre-existing dx of PTSD /GAD/ Depression  Current Symptoms/Problems: Depression/Anxiety   Patient Reported Schizophrenia/Schizoaffective Diagnosis in Past: No   Strengths: Hardworker, Good Friend to Others,  Preferences: I like to travel and go shopping  Abilities: No noted current intrest or hobbies outside of travel and shopping   Type of Services Patient Feels are Needed: Dr. Nehemiah Settle (currently for Med Management) and Individual Therapy   Initial Clinical Notes/Concerns: The patient notes prior mh involvement including around 5years ago with [facility]. The patient notes a prior hospitalization in 2017 for self harm attempt for medicine overdose. No current S/I or H/I   Mental Health Symptoms Depression:   Change in energy/activity; Difficulty Concentrating; Fatigue; Weight gain/loss; Increase/decrease in appetite; Sleep (too much or little)   Duration of Depressive symptoms:  Greater than two weeks   Mania:   None   Anxiety:    Tension; Worrying; Sleep; Restlessness; Irritability; Fatigue; Difficulty concentrating   Psychosis:   None   Duration of Psychotic symptoms: NA  Trauma:   Avoids reminders of event; Re-experience of traumatic event;  Irritability/anger; Hypervigilance; Difficulty staying/falling asleep; Guilt/shame   Obsessions:   None   Compulsions:   None   Inattention:   N/A   Hyperactivity/Impulsivity:   None   Oppositional/Defiant Behaviors:   None   Emotional Irregularity:   None   Other Mood/Personality Symptoms:   NA    Mental Status Exam Appearance and self-care  Stature:   Tall   Weight:   Overweight   Clothing:   Casual   Grooming:   Normal   Cosmetic use:   None   Posture/gait:   Normal   Motor activity:   Not Remarkable   Sensorium  Attention:   Normal   Concentration:   Anxiety interferes   Orientation:   X5   Recall/memory:   Normal   Affect and Mood  Affect:   Appropriate   Mood:   Anxious; Depressed   Relating  Eye contact:   Normal   Facial expression:   Responsive   Attitude toward examiner:   Cooperative   Thought and Language  Speech flow:  Normal   Thought content:   Appropriate to Mood and Circumstances   Preoccupation:   None   Hallucinations:   None   Organization:  Logical  Transport planner of Knowledge:   Good   Intelligence:   Average   Abstraction:   Normal   Judgement:   Good   Reality Testing:   Realistic   Insight:  Good   Decision Making:   Normal   Social Functioning  Social Maturity:   Responsible   Social Judgement:   Normal   Stress  Stressors:  None outside of MH symptoms  Coping Ability:   Normal   Skill Deficits:   None   Supports:   Friends/Service system; Family     Religion: Religion/Spirituality Are You A Religious Person?: No How Might This Affect Treatment?: NA  Leisure/Recreation: Leisure / Recreation Do You Have Hobbies?: Yes Leisure and Hobbies: Travel and Shopping  Exercise/Diet: Exercise/Diet Do You Exercise?: No Have You Gained or Lost A Significant Amount of Weight in the Past Six Months?: Yes-Lost Number of Pounds Lost?: 25 Do You Follow  a Special Diet?: No Do You Have Any Trouble Sleeping?: Yes Explanation of Sleeping Difficulties: The patient notes difficulty with staying asleep   CCA Employment/Education Employment/Work Situation: Employment / Work Situation Employment Situation: Employed Where is Patient Currently Employed?: [employer] How Long has Patient Been Employed?: 41yr Are You Satisfied With Your Job?: Yes Do You Work More Than One Job?: No Work Stressors: NO Patient's Job has Been Impacted by Current Illness: Yes Describe how Patient's Job has Been Impacted: NA What is the Longest Time Patient has Held a Job?: 12 years 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 Has Patient ever Been in the [employer]: No  Education: Education Is Patient Currently Attending School?: No Last Grade Completed: 12 [place] of [place] Y[place]acher, adult educationFrom [employer]: Yes Did YPhysicist, medical: Yes What Type of College Degree Do you Have?: NA Did You Attend Graduate School?: No What Was Your Major?: NA Did You Have Any Special Interests In School?: NA Did You Have An Individualized Education Program (IIEP): No Did You Have Any Difficulty At School?: No Patient's Education Has Been Impacted by Current Illness: No   CCA Family/Childhood History Family and Relationship History: Family history Marital status: Married Number of Years Married: 5 What types of issues is patient dealing with in the relationship?: None Additional relationship information: No Additional Are you sexually active?: Yes What is your sexual orientation?: Heterosexual Has your sexual activity been affected by drugs, alcohol, medication, or emotional stress?: NA Does patient have children?: No  Childhood History:  Childhood History By whom was/is the patient raised?: Both parents Additional childhood history information: No additional Description of patient's relationship with  caregiver when they were a child: Great with both Patient's description of current relationship with people who raised him/her: The patient notes that she has a very good relationship with both parents How were you disciplined when you got in trouble as a child/adolescent?: Parents just talked to her Does patient have siblings?: Yes Number of Siblings: 2 Description of patient's current relationship with siblings: The patient notes she hasa good relationship with her brother and sister Did patient suffer any verbal/emotional/physical/sexual abuse as a child?: No Did patient suffer from severe childhood neglect?: No Has patient ever been sexually abused/assaulted/raped as an adolescent or adult?: No Was the patient ever a victim of a crime or a disaster?: No Witnessed domestic violence?: No Has patient been affected by domestic violence as an adult?: No  Child/Adolescent Assessment:     CCA Substance Use Alcohol/Drug Use: Alcohol / Drug Use Pain Medications: see MAR Prescriptions: see MAR Over the Counter: None History of alcohol / drug use?: No history of alcohol / drug abuse Longest period of sobriety (when/how long): NA  ASAM's:  Six Dimensions of Multidimensional Assessment  Dimension 1:  Acute Intoxication and/or Withdrawal Potential:      Dimension 2:  Biomedical Conditions and Complications:      Dimension 3:  Emotional, Behavioral, or Cognitive Conditions and Complications:     Dimension 4:  Readiness to Change:     Dimension 5:  Relapse, Continued use, or Continued Problem Potential:     Dimension 6:  Recovery/Living Environment:     ASAM Severity Score:    ASAM Recommended Level of Treatment:     Substance use Disorder (SUD)    Recommendations for Services/Supports/Treatments: Recommendations for Services/Supports/Treatments Recommendations For Services/Supports/Treatments: Individual Therapy, Medication Management  DSM5  Diagnoses: Patient Active Problem List   Diagnosis Date Noted   PTSD (post-traumatic stress disorder) 03/13/2022   Generalized anxiety disorder with panic attacks 03/13/2022   Severe episode of recurrent major depressive disorder, without psychotic features ([place]) 03/13/2022   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 03/13/2022   Other insomnia 03/13/2022   Long term current use of antipsychotic medication 03/13/2022   Polypharmacy 03/13/2022   Convulsion ([place]) 11/26/2021   Seizure ([place]) 10/03/2021   Irregular periods 09/26/2020   Mild persistent asthma with acute exacerbation 06/18/2017   Bipolar I disorder, most recent episode depressed ([place]) 12/20/2016   Bipolar 1 disorder, mixed, severe ([place]) 12/20/2016   Uncontrolled type 2 diabetes mellitus with hyperglycemia, with long-term current use of insulin ([place]) 09/18/2016   HTN (hypertension) 09/18/2016   Seizures ([place]) 06/04/2016   Pelvic pain in female 11/14/2015   Acute pyelonephritis 01/16/2013   Migraines 10/30/2012   Obesity, Class III, BMI 40-49.9 (morbid obesity) ([place]) 10/30/2012   Kidney stones     Patient Centered Plan: Patient is on the following Treatment Plan(s):  Severe Episode of MDD without psych / GAD / PTSD   Referrals to Alternative Service(s): Referred to Alternative Service(s):   Place:   Date:   Time:    Referred to Alternative Service(s):   Place:   Date:   Time:    Referred to Alternative Service(s):   Place:   Date:   Time:    Referred to Alternative Service(s):   Place:   Date:   Time:      Collaboration of Care: Overview of patient involvement in the Med Therapy program with prescriber Dr. Nehemiah Settle.  Patient/Guardian was advised Release of Information must be obtained prior to any record release in order to collaborate their care with an outside provider. Patient/Guardian was advised if they have not already done so to contact the registration department to sign all necessary  forms in order for [place] to release information regarding their care.   Consent: Patient/Guardian gives verbal consent for treatment and assignment of benefits for services provided during this visit. Patient/Guardian expressed understanding and agreed to proceed.   I discussed the assessment and treatment plan with the patient. The patient was provided an opportunity to ask questions and all were answered. The patient agreed with the plan and demonstrated an understanding of the instructions.   The patient was advised to call back or seek an in-person evaluation if the symptoms worsen or if the condition fails to improve as anticipated.  I provided 45 minutes of non-face-to-face time during this encounter.  Lennox Grumbles, LCSW  03/25/2022

## 2022-04-02 ENCOUNTER — Telehealth (INDEPENDENT_AMBULATORY_CARE_PROVIDER_SITE_OTHER): Payer: No Typology Code available for payment source | Admitting: Psychiatry

## 2022-04-02 DIAGNOSIS — Z79899 Other long term (current) drug therapy: Secondary | ICD-10-CM

## 2022-04-02 DIAGNOSIS — F4310 Post-traumatic stress disorder, unspecified: Secondary | ICD-10-CM | POA: Diagnosis not present

## 2022-04-02 DIAGNOSIS — F332 Major depressive disorder, recurrent severe without psychotic features: Secondary | ICD-10-CM

## 2022-04-02 DIAGNOSIS — G2571 Drug induced akathisia: Secondary | ICD-10-CM | POA: Insufficient documentation

## 2022-04-02 DIAGNOSIS — G4709 Other insomnia: Secondary | ICD-10-CM

## 2022-04-02 DIAGNOSIS — F411 Generalized anxiety disorder: Secondary | ICD-10-CM

## 2022-04-02 DIAGNOSIS — F410 Panic disorder [episodic paroxysmal anxiety] without agoraphobia: Secondary | ICD-10-CM

## 2022-04-02 HISTORY — DX: Drug induced akathisia: G25.71

## 2022-04-02 MED ORDER — PRAZOSIN HCL 2 MG PO CAPS: 2.0000 mg | ORAL_CAPSULE | Freq: Every day | ORAL | 0 refills | Status: DC

## 2022-04-02 MED ORDER — OLANZAPINE 10 MG PO TABS: ORAL_TABLET | ORAL | 0 refills | Status: DC

## 2022-04-02 MED ORDER — GABAPENTIN 100 MG PO CAPS: 200.0000 mg | ORAL_CAPSULE | Freq: Two times a day (BID) | ORAL | 0 refills | Status: DC | PRN

## 2022-04-02 NOTE — Patient Instructions (Signed)
We made several changes to your medication today. We increased your prazosin to '2mg'$  nightly to help with nightmares and sleep. We increased your as needed dose of gabapentin to '200mg'$  twice daily for panic attacks. We discontinued your doxepin to allow for greater flexibility in medication changes in the future. We decreased your olanzapine (zyprexa) to '5mg'$  in the morning and '10mg'$  at night. This should help with the akathisia (crawling out of your skin and restless feeling) but you can take benadryl at '25mg'$  when you are experiencing this as well.
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
## 2022-04-15 ENCOUNTER — Ambulatory Visit (INDEPENDENT_AMBULATORY_CARE_PROVIDER_SITE_OTHER): Payer: No Typology Code available for payment source | Admitting: Clinical

## 2022-04-15 DIAGNOSIS — F410 Panic disorder [episodic paroxysmal anxiety] without agoraphobia: Secondary | ICD-10-CM

## 2022-04-15 DIAGNOSIS — F332 Major depressive disorder, recurrent severe without psychotic features: Secondary | ICD-10-CM | POA: Diagnosis not present

## 2022-04-15 DIAGNOSIS — F411 Generalized anxiety disorder: Secondary | ICD-10-CM

## 2022-04-15 DIAGNOSIS — F4310 Post-traumatic stress disorder, unspecified: Secondary | ICD-10-CM | POA: Diagnosis not present

## 2022-04-15 NOTE — Progress Notes (Addendum)
Virtual Visit via Video Note  I connected with Andrea [name] on 04/15/22 at 10:00 AM EDT by a video enabled telemedicine application and verified that I am speaking with the correct person using two identifiers.  Location: Patient: Home Provider: Office   I discussed the limitations of evaluation and management by telemedicine and the availability of in person appointments. The patient expressed understanding and agreed to proceed.   THERAPIST PROGRESS NOTE   Session Time: 10:00 AM-10:30 AM   Participation Level: Active   Behavioral Response: CasualAlertDepressed   Type of Therapy: Individual Therapy   Treatment Goals addressed: Coping for Depression and  and PTSD   Interventions: CBT, Motivational Interviewing, Strength-based and Supportive   Summary: Andrea [name] is a 41 y.o. female who presents with Depression /Anxiety/ PTSD.The OPT therapist worked with the patient for her ongoing outpatient. OPT treatment. The OPT therapist utilized Motivational Interviewing to assist in creating therapeutic repore. The patient in the session was engaged and work in collaboration giving feedback about her triggers and symptoms over the past few weeks. The patient spoke about recent change at her work with having a new supervisor and getting adjusted with the new supervisors expectations for staff. The OPT therapist utilized Cognitive Behavioral Therapy through cognitive restructuring as well as worked with the patient on self awareness and implementing coping strategies to assist in management of current stressors.The patient spoke about her work with Dr. Nehemiah Settle in her med therapy program and intent to talk with the provider about changes to assist with regulating her sleep cycle. The OPT therapist overviewed upcoming health appointments with the patient as listed in the patients MyChart including upcoming med management appointment with Dr. Nehemiah Settle tomorrow.   Suicidal/Homicidal:  Nowithout intent/plan   Therapist Response: The OPT therapist worked with the patient for the patients scheduled session. The patient was engaged in her session and gave feedback in relation to triggers, symptoms, and behavior responses over the past few weeks including the impact of adjusting to multiple changes in a short period of time.The OPT therapist worked with the patient utilizing an in session Cognitive Behavioral Therapy exercise. The patient was responsive in the session and verbalized, " I would say my  Anxiety has been a little higher with work stress due to having a new supervisor and trying to figure them out and understand their expectations".  The patient noted currently the staff does not have a team meeting and liked the idea from the [place] therapist about asking the supervisor about having a team meeting.The OPT therapist worked with the patient on self check-ins throughout her week, regulating her sleep cycle, and having open communication with her new supervisor.  Diagnosis:      Axis I:  Severe Recurrent MDD without psych features / GAD / PTSD                           Axis II: No diagnosis       Collaboration of Care: No additional collaboration for this session    Patient/Guardian was advised Release of Information must be obtained prior to any record release in order to collaborate their care with an outside provider. Patient/Guardian was advised if they have not already done so to contact the registration department to sign all necessary forms in order for [place] to release information regarding their care.    Consent: Patient/Guardian gives verbal consent for treatment and assignment of benefits for services provided during  this visit. Patient/Guardian expressed understanding and agreed to proceed.    I discussed the assessment and treatment plan with the patient. The patient was provided an opportunity to ask questions and all were answered. The patient agreed with the plan  and demonstrated an understanding of the instructions.   The patient was advised to call back or seek an in-person evaluation if the symptoms worsen or if the condition fails to improve as anticipated.   I provided 30 minutes of non-face-to-face time during this encounter.     Lennox Grumbles, LCSW   04/15/2022

## 2022-04-16 ENCOUNTER — Telehealth (INDEPENDENT_AMBULATORY_CARE_PROVIDER_SITE_OTHER): Payer: No Typology Code available for payment source | Admitting: Psychiatry

## 2022-04-16 ENCOUNTER — Encounter (HOSPITAL_COMMUNITY): Payer: Self-pay | Admitting: Psychiatry

## 2022-04-16 DIAGNOSIS — F4310 Post-traumatic stress disorder, unspecified: Secondary | ICD-10-CM

## 2022-04-16 DIAGNOSIS — F411 Generalized anxiety disorder: Secondary | ICD-10-CM

## 2022-04-16 DIAGNOSIS — F445 Conversion disorder with seizures or convulsions: Secondary | ICD-10-CM

## 2022-04-16 DIAGNOSIS — G4709 Other insomnia: Secondary | ICD-10-CM

## 2022-04-16 DIAGNOSIS — F410 Panic disorder [episodic paroxysmal anxiety] without agoraphobia: Secondary | ICD-10-CM

## 2022-04-16 DIAGNOSIS — Z79899 Other long term (current) drug therapy: Secondary | ICD-10-CM

## 2022-04-16 DIAGNOSIS — F332 Major depressive disorder, recurrent severe without psychotic features: Secondary | ICD-10-CM | POA: Diagnosis not present

## 2022-04-16 MED ORDER — OLANZAPINE 10 MG PO TABS: 10.0000 mg | ORAL_TABLET | Freq: Every day | ORAL | 0 refills | Status: DC

## 2022-04-16 MED ORDER — GABAPENTIN 300 MG PO CAPS: 300.0000 mg | ORAL_CAPSULE | Freq: Two times a day (BID) | ORAL | 0 refills | Status: DC | PRN

## 2022-04-16 NOTE — Patient Instructions (Addendum)
We decreased your olanzapine to just '10mg'$  at night; you can stop taking the '5mg'$  morning dose at this point. It should help with that crawling out of your skin feeling. We also changed your gabapentin as needed dose to '300mg'$  twice daily for panic. Dr. Nehemiah Settle will update Dr. [name] about increasing your night time dose of gabapentin to '800mg'$  (two '300mg'$  capsules with two '100mg'$  capusles) as this should help with your insomnia. When you see your PCP next, have them draw a lipid panel and hemoglobin a1c.
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
## 2022-04-18 ENCOUNTER — Telehealth (HOSPITAL_COMMUNITY): Payer: Self-pay | Admitting: *Deleted

## 2022-04-18 ENCOUNTER — Encounter (HOSPITAL_COMMUNITY): Payer: Self-pay

## 2022-04-18 NOTE — Telephone Encounter (Signed)
Opened in Error.

## 2022-04-19 ENCOUNTER — Other Ambulatory Visit (HOSPITAL_COMMUNITY): Payer: Self-pay | Admitting: Psychiatry

## 2022-04-19 DIAGNOSIS — G43009 Migraine without aura, not intractable, without status migrainosus: Secondary | ICD-10-CM

## 2022-04-19 DIAGNOSIS — F410 Panic disorder [episodic paroxysmal anxiety] without agoraphobia: Secondary | ICD-10-CM

## 2022-04-19 DIAGNOSIS — G4709 Other insomnia: Secondary | ICD-10-CM

## 2022-04-19 MED ORDER — GABAPENTIN 300 MG PO CAPS: ORAL_CAPSULE | ORAL | 0 refills | Status: DC

## 2022-04-23 ENCOUNTER — Encounter: Payer: Self-pay | Admitting: Neurology

## 2022-04-23 ENCOUNTER — Ambulatory Visit (INDEPENDENT_AMBULATORY_CARE_PROVIDER_SITE_OTHER): Payer: No Typology Code available for payment source | Admitting: Neurology

## 2022-04-23 VITALS — BP 107/83 | HR 84 | Resp 20 | Ht 71.0 in | Wt 292.0 lb

## 2022-04-23 DIAGNOSIS — R29898 Other symptoms and signs involving the musculoskeletal system: Secondary | ICD-10-CM

## 2022-04-23 DIAGNOSIS — G43001 Migraine without aura, not intractable, with status migrainosus: Secondary | ICD-10-CM

## 2022-04-23 DIAGNOSIS — F445 Conversion disorder with seizures or convulsions: Secondary | ICD-10-CM | POA: Diagnosis not present

## 2022-04-23 MED ORDER — ONDANSETRON 4 MG PO TBDP: 4.0000 mg | ORAL_TABLET | Freq: Three times a day (TID) | ORAL | 11 refills | Status: DC | PRN

## 2022-04-23 MED ORDER — DIPHENHYDRAMINE HCL 50 MG/ML IJ SOLN
50.0000 mg | Freq: Once | INTRAMUSCULAR | Status: AC
  Administered 2022-04-23: 25 mg via INTRAMUSCULAR

## 2022-04-23 MED ORDER — ZONISAMIDE 100 MG PO CAPS: ORAL_CAPSULE | ORAL | 6 refills | Status: DC

## 2022-04-23 MED ORDER — METOCLOPRAMIDE HCL 5 MG/ML IJ SOLN
10.0000 mg | Freq: Once | INTRAMUSCULAR | Status: AC
  Administered 2022-04-23: 10 mg via INTRAMUSCULAR

## 2022-04-23 MED ORDER — KETOROLAC TROMETHAMINE 60 MG/2ML IM SOLN
60.0000 mg | Freq: Once | INTRAMUSCULAR | Status: AC
  Administered 2022-04-23: 60 mg via INTRAMUSCULAR

## 2022-04-23 MED ORDER — SUMATRIPTAN SUCCINATE 100 MG PO TABS: ORAL_TABLET | ORAL | 11 refills | Status: DC

## 2022-04-23 NOTE — Patient Instructions (Signed)
Good to see you.  Migraine cocktail today  2. Schedule EMG/NCV of both upper extremities  3. Continue all your medications. A prescription for Zofran has also been sent  4. Continue follow-up with Behavioral Health  5. Follow-up in 4-5 months, call for any changes

## 2022-04-23 NOTE — Progress Notes (Signed)
NEUROLOGY FOLLOW UP OFFICE NOTE  Andrea [name] [id number] January 22, 1981  HISTORY OF PRESENT ILLNESS: I had the pleasure of seeing Andrea [name] in follow-up in the neurology clinic on 04/23/2022.  The patient was last seen 3 months ago for psychogenic non-epileptic events and migraines. She is again accompanied by her husband [name] who helps supplement the history today.  Records and images were personally reviewed where available. Since her last visit, she did well with no seizures for 3 months until 04/21/22. She went to work and called him at 11am that she had started vomiting. She had a migraine that day. She got back home then later that evening had an episode where [name] shows a video of her intermittently having upper body jerks with her head tilted to the right, eyes open and blinking, then she falls back on the bed with eyes closed, limp. He reports the episode lasted 45 seconds, then an hour later she had another one. She reports a migraine for the past 4 days. Imitrex has been helpful for migraines but it has not helped the past 4 days. She has noticed weakness in both hands, dropping things. She has tingling in her hands, mostly in the last 3 digits. No neck pain.  She has kindly been seen by psychiatrist Dr. Nehemiah Settle, currently on medication taper of Zyprexa which she is happy with. She is off the Doxepin. Gabapentin dose recently increased to 300-300-'900mg'$ . She is also on Prazosin and Fluoxetine. She feels better overall and has started CBT. Sleep is better. [name] notices she talks in her sleep, as if she is still in [employer], talking about money. Sometimes she moves her hands while asleep. She will be seeing her PCP at the end of the week to discuss a sleep study. She fell into the tub yesterday, she thinks she just tripped.    History on Initial Assessment 08/08/2021: This is a pleasant 41 year old right-handed woman with a history of hypertension, DM, migraines, bipolar disorder, presenting for  evaluation of new onset seizures. She is accompanied by her husband who helps supplement the history today. She was in her usual state of health until 07/31/21 while at work at [employer], she started feeling a little funny and sat down, then woke up in the hospital. Co-workers reported she got went, went "pale white," then slid down to the floor and had a 4-minute episode of jerking with EMS noting post-ictal confusion. No tongue bite or incontinence. In the ER, her husband reports she had 2 more seizures, but ER notes indicate there was an episode of upper body jerking including head jerking lasting 15-20 seconds. Bloodwork showed creatinine 1.21, glucose 265. I personally reviewed MRI brain with and without contrast which was normal, hippocampi symmetric with no abnormal signal or enhancement seen. Her wake and drowsy EEG was normal. She had been on Gabapentin for anxiety, dose was increased to '300mg'$  TID. She feels her left side has been weaker since then, this has gotten better. Since hospital discharge, her husband reports more seizures. She had an unwitnessed one on 2/16 where she recalls going to the bathroom and coming out, then waking up on the ground, no injuries/tongue bite/incontinence. There was no prior warning. On 2/17, after eating a snack, she told her husband she felt funny and lay down, she then had another shaking episode lasting less than a minute. The last episode was on 2/18, they were dozing in bed, he felt her shaking and saw her lying on her  left side. He thinks her eyes were closed, mouth open, it lasted less than 30 seconds, she woke up, then had another 30-second shaking episode. She had no memory of it and told her husband her head hurt and she felt very tired. Her husband denies any staring episodes. She denies any olfactory/gustatory hallucinations, deja vu, rising epigastric sensation, focal numbness/tingling. She has occasional jerks in her legs. She used to have migraines that quieted  down. She used to take Topamax but it interacted with one of her medications. Since the initial seizure, she has had frontal throbbing headaches that recur, different from past headaches. She is sensitive to lights/sounds, no nausea/vomiting. Tylenol helps sometimes but lately has not, she has been taking 2 '650mg'$  tabs three times a day for the past week. She denies any diplopia, dysarthria/dysphagia, neck/back pain, bowel/bladder dysfunction. Her husband tried to check her glucose level with the last seizure, it was 271. She denies any sleep deprivation, she gets 6-8 hours of sleep with Doxepin. No recent change in stress levels. She is on Zyprexa for mood, mood is good. She is not having a lot of anxiety anymore. She lives with her husband and works as a Retail banker at [employer]. No alcohol use. She had a normal birth and early development.  There is no history of febrile convulsions, CNS infections such as meningitis/encephalitis, significant traumatic brain injury, neurosurgical procedures, or family history of seizures.  Update 01/16/2022: Since her last visit, she was admitted for EMU monitoring from June 12-16, 2023 where Gabapentin and Zonisamide were held. Baseline EEG was normal, typical events were not captured. Diagnosis of migraine with aura versus nonepileptic events were discussed with them. She did well event-free for 3 months until 7/29 when she had 2 seizures followed by chest pressure, so they went to the ER. BP was 95/56, EKG showed sinus rhythm, troponin negative. Since then, she has had 2 on 7/31, and another 1.5 hours ago. She feels really tired. No tongue bite or incontinence. Her husband shows 2 videos of her typical events. During one, she is lying on the bed with eyes closed, head bobbing up and down with irregular upper body movements, heavy breathing, unresponsive to husband afterwards. Another video shows her with her head bent down, her husband notes she started staring, then  head slumped down and she started having side to side head movements as he lay her on the bed with upper body rocking, eyes closed.   Prior ASMs: Topiramate  PAST MEDICAL HISTORY: Past Medical History:  Diagnosis Date   Anxiety    Bipolar 1 disorder ([place])    Complication of anesthesia    hard time waking up    Depression    Diabetes mellitus without complication (HCC)    GERD (gastroesophageal reflux disease)    no meds   Kidney stones    Low back pain    Migraines    PCOS (polycystic ovarian syndrome)    PONV (postoperative nausea and vomiting)    Schizophrenia (HCC)    Shortness of breath dyspnea    with bronchitis    MEDICATIONS: Current Outpatient Medications on File Prior to Visit  Medication Sig Dispense Refill   albuterol (VENTOLIN HFA) 108 (90 Base) MCG/ACT inhaler Inhale 1-2 puffs into the lungs every 6 (six) hours as needed for wheezing or shortness of breath. 18 g 0   diphenhydrAMINE (BENADRYL) 25 MG tablet Take 25 mg by mouth 3 (three) times daily as needed for itching (with  each dose of hydrocodone/apap).     empagliflozin (JARDIANCE) 25 MG TABS tablet Take 25 mg by mouth every morning.     FLUoxetine (PROZAC) 20 MG capsule Take 1 capsule (20 mg total) by mouth daily. (Patient taking differently: Take 60 mg by mouth every morning.) 90 capsule 0   fluticasone (FLONASE) 50 MCG/ACT nasal spray Place 1 spray into both nostrils daily. 16 g 0   gabapentin (NEURONTIN) 300 MG capsule Take 1 capsule each morning, one at noon, and 3 at night. Can take 1 capsule twice daily for panic. 210 capsule 0   hydrochlorothiazide (HYDRODIURIL) 25 MG tablet Take 25 mg by mouth daily as needed for other.     HYDROcodone-acetaminophen (NORCO) 7.5-325 MG tablet Take 1 tablet by mouth 3 (three) times daily as needed (pain). Take with benadryl     insulin degludec (TRESIBA FLEXTOUCH) 100 UNIT/ML FlexTouch Pen Inject 20 Units into the skin 2 (two) times daily with a meal.     levocetirizine  (XYZAL) 5 MG tablet Take 5 mg by mouth at bedtime.     OLANZapine (ZYPREXA) 10 MG tablet Take 1 tablet (10 mg total) by mouth at bedtime. 30 tablet 0   oxybutynin (DITROPAN-XL) 5 MG 24 hr tablet Take one tablet twice daily (Patient taking differently: Take 5 mg by mouth 2 (two) times daily.) 60 tablet 2   pantoprazole (PROTONIX) 40 MG tablet Take 40 mg by mouth every morning.     prazosin (MINIPRESS) 2 MG capsule Take 1 capsule (2 mg total) by mouth at bedtime. 30 capsule 0   Semaglutide (RYBELSUS) 14 MG TABS Take 14 mg by mouth every morning.     SUMAtriptan (IMITREX) 100 MG tablet Take 1 tablet at onset of migraine. May May repeat in 2 hours if headache persists or recurs. Do not take more than 3 a week 10 tablet 11   tiZANidine (ZANAFLEX) 4 MG tablet Take 4 mg by mouth every 8 (eight) hours as needed for muscle spasms.     zonisamide (ZONEGRAN) 100 MG capsule Take 4 capsules every night 120 capsule 6   [DISCONTINUED] fluticasone (FLOVENT HFA) 110 MCG/ACT inhaler Inhale 2 puffs into the lungs 2 (two) times daily. For shortness of breath (Patient not taking: Reported on 09/26/2020) 3 Inhaler 0   [DISCONTINUED] metFORMIN (GLUCOPHAGE) 500 MG tablet Take 1 tablet (500 mg total) by mouth 2 (two) times daily with a meal. (Patient not taking: No sig reported) 60 tablet 1   [DISCONTINUED] trazodone (DESYREL) 300 MG tablet Take 300 mg by mouth at bedtime. (Patient not taking: Reported on 09/26/2020)     No current facility-administered medications on file prior to visit.    ALLERGIES: Allergies  Allergen Reactions   Bactrim [Sulfamethoxazole-Trimethoprim] Hives and Itching   Codeine Hives and Itching   Hydrocodone-Acetaminophen Itching    Can take with benadryl    FAMILY HISTORY: Family History  Problem Relation Age of Onset   Healthy Mother    Healthy Father     SOCIAL HISTORY: Social History   Socioeconomic History   Marital status: Divorced    Spouse name: Not on file   Number of  children: 0   Years of education: 12   Highest education level: Not on file  Occupational History   Not on file  Tobacco Use   Smoking status: Never   Smokeless tobacco: Never  Vaping Use   Vaping Use: Never used  Substance and Sexual Activity   Alcohol use: Never  Drug use: Never   Sexual activity: Never  Other Topics Concern   Not on file  Social History Narrative   Right handed   Drinks caffeine   One story home   Social Determinants of Health   Financial Resource Strain: Not on file  Food Insecurity: Not on file  Transportation Needs: Not on file  Physical Activity: Not on file  Stress: Not on file  Social Connections: Not on file  Intimate Partner Violence: Not on file     PHYSICAL EXAM: Vitals:   04/23/22 1511  BP: 107/83  Pulse: 84  Resp: 20  SpO2: 97%   General: No acute distress, less flat affect today, smiling and slightly more animated Head:  Normocephalic/atraumatic Skin/Extremities: No rash, no edema Neurological Exam: alert and awake. No aphasia or dysarthria. Fund of knowledge is appropriate. Attention and concentration are normal.   Cranial nerves: Pupils equal, round. Extraocular movements intact with no nystagmus. Visual fields full.  No facial asymmetry.  Motor: Bulk and tone normal, muscle strength 5/5 throughout with no pronator drift.  Finger to nose testing intact.  Gait narrow-based and steady, able to tandem walk adequately.  Romberg negative. Negative Tinel sign at the wrist and elbow.   IMPRESSION: This is a pleasant 41 yo RH woman with a history of hypertension, DM, migraines, bipolar disorder, with new onset seizures since February 2023. She would have staring followed by shaking. Her 48-hour EEG and 5-day EMU admission showed normal baseline EEG, however typical events were not captured. Her husband brings 2 videos today which were reviewed and consistent with psychogenic non-epileptic events. She is now seeing Psychiatry and doing CBT.  There has been a reduction in events, she has had only 2 in the past 3 months. She continues to report migraines, continue to monitor on increased Gabapentin dose, continue Zonisamide '400mg'$  qhs. She has prn Imitrex. She has had a migraine for the past 4 days, migraine cocktail will be given in the office today to hopefully break current cycle. Zofran will be given for migraine-associated nausea/vomiting. She is reporting bilateral hand weakness, EMG/NCV of both UE will be ordered to further evaluate symptoms. She does not drive. Follow-up in 4-5 months, call for any changes.    Thank you for allowing me to participate in her care.  Please do not hesitate to call for any questions or concerns.    Ellouise Newer, M.D.   CC: Bernardo Heater, FNP, Dr. Nehemiah Settle

## 2022-04-24 ENCOUNTER — Encounter: Payer: Self-pay | Admitting: Neurology

## 2022-04-24 ENCOUNTER — Other Ambulatory Visit (HOSPITAL_COMMUNITY): Payer: Self-pay | Admitting: Psychiatry

## 2022-04-24 DIAGNOSIS — G4709 Other insomnia: Secondary | ICD-10-CM

## 2022-04-24 DIAGNOSIS — F4310 Post-traumatic stress disorder, unspecified: Secondary | ICD-10-CM

## 2022-04-25 MED ORDER — AIMOVIG 140 MG/ML ~~LOC~~ SOAJ
1.0000 | SUBCUTANEOUS | 11 refills | Status: DC
Start: 2022-04-25 — End: 2022-06-24

## 2022-04-30 ENCOUNTER — Telehealth (INDEPENDENT_AMBULATORY_CARE_PROVIDER_SITE_OTHER): Payer: No Typology Code available for payment source | Admitting: Psychiatry

## 2022-04-30 ENCOUNTER — Encounter (HOSPITAL_COMMUNITY): Payer: Self-pay | Admitting: Psychiatry

## 2022-04-30 DIAGNOSIS — F445 Conversion disorder with seizures or convulsions: Secondary | ICD-10-CM

## 2022-04-30 DIAGNOSIS — F411 Generalized anxiety disorder: Secondary | ICD-10-CM | POA: Diagnosis not present

## 2022-04-30 DIAGNOSIS — F332 Major depressive disorder, recurrent severe without psychotic features: Secondary | ICD-10-CM | POA: Diagnosis not present

## 2022-04-30 DIAGNOSIS — G4709 Other insomnia: Secondary | ICD-10-CM | POA: Diagnosis not present

## 2022-04-30 DIAGNOSIS — Z79899 Other long term (current) drug therapy: Secondary | ICD-10-CM

## 2022-04-30 DIAGNOSIS — G2571 Drug induced akathisia: Secondary | ICD-10-CM

## 2022-04-30 DIAGNOSIS — G43009 Migraine without aura, not intractable, without status migrainosus: Secondary | ICD-10-CM

## 2022-04-30 DIAGNOSIS — F4310 Post-traumatic stress disorder, unspecified: Secondary | ICD-10-CM

## 2022-04-30 DIAGNOSIS — F410 Panic disorder [episodic paroxysmal anxiety] without agoraphobia: Secondary | ICD-10-CM

## 2022-04-30 MED ORDER — OLANZAPINE 10 MG PO TABS: 5.0000 mg | ORAL_TABLET | Freq: Every day | ORAL | 0 refills | Status: DC

## 2022-04-30 MED ORDER — FLUOXETINE HCL 20 MG PO CAPS: 60.0000 mg | ORAL_CAPSULE | Freq: Every morning | ORAL | 1 refills | Status: DC

## 2022-04-30 MED ORDER — PRAZOSIN HCL 1 MG PO CAPS: 3.0000 mg | ORAL_CAPSULE | Freq: Every day | ORAL | 0 refills | Status: DC

## 2022-04-30 NOTE — Patient Instructions (Signed)
We decreased your zyprexa to '5mg'$  (half a tablet) nightly. We also increased your prazosin to '3mg'$  (three, '1mg'$  tablets) nightly.
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
## 2022-05-13 ENCOUNTER — Encounter (HOSPITAL_COMMUNITY): Payer: Self-pay

## 2022-05-13 ENCOUNTER — Ambulatory Visit (HOSPITAL_COMMUNITY): Payer: No Typology Code available for payment source | Admitting: Clinical

## 2022-05-14 ENCOUNTER — Encounter (HOSPITAL_COMMUNITY): Payer: Self-pay | Admitting: Psychiatry

## 2022-05-14 ENCOUNTER — Telehealth (INDEPENDENT_AMBULATORY_CARE_PROVIDER_SITE_OTHER): Payer: No Typology Code available for payment source | Admitting: Psychiatry

## 2022-05-14 DIAGNOSIS — F332 Major depressive disorder, recurrent severe without psychotic features: Secondary | ICD-10-CM

## 2022-05-14 DIAGNOSIS — F445 Conversion disorder with seizures or convulsions: Secondary | ICD-10-CM | POA: Diagnosis not present

## 2022-05-14 DIAGNOSIS — F410 Panic disorder [episodic paroxysmal anxiety] without agoraphobia: Secondary | ICD-10-CM

## 2022-05-14 DIAGNOSIS — G43009 Migraine without aura, not intractable, without status migrainosus: Secondary | ICD-10-CM

## 2022-05-14 DIAGNOSIS — F4310 Post-traumatic stress disorder, unspecified: Secondary | ICD-10-CM

## 2022-05-14 DIAGNOSIS — G4709 Other insomnia: Secondary | ICD-10-CM

## 2022-05-14 DIAGNOSIS — F411 Generalized anxiety disorder: Secondary | ICD-10-CM | POA: Diagnosis not present

## 2022-05-14 MED ORDER — FLUOXETINE HCL 20 MG PO CAPS: 60.0000 mg | ORAL_CAPSULE | Freq: Every morning | ORAL | 1 refills | Status: DC

## 2022-05-14 MED ORDER — MIRTAZAPINE 15 MG PO TABS: 15.0000 mg | ORAL_TABLET | Freq: Every day | ORAL | 1 refills | Status: DC

## 2022-05-14 NOTE — Patient Instructions (Signed)
We added remeron '15mg'$  nightly to your regimen today which should help with sleep and begin to lift your depression and decrease your anxiety a bit more. Also we discontinued the benadryl since you should no longer need it with stopping the olanzapine.
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
## 2022-05-23 ENCOUNTER — Telehealth (INDEPENDENT_AMBULATORY_CARE_PROVIDER_SITE_OTHER): Payer: No Typology Code available for payment source | Admitting: Psychiatry

## 2022-05-23 ENCOUNTER — Encounter (HOSPITAL_COMMUNITY): Payer: Self-pay | Admitting: Psychiatry

## 2022-05-23 DIAGNOSIS — F411 Generalized anxiety disorder: Secondary | ICD-10-CM

## 2022-05-23 DIAGNOSIS — F4310 Post-traumatic stress disorder, unspecified: Secondary | ICD-10-CM

## 2022-05-23 DIAGNOSIS — F445 Conversion disorder with seizures or convulsions: Secondary | ICD-10-CM

## 2022-05-23 DIAGNOSIS — G4709 Other insomnia: Secondary | ICD-10-CM

## 2022-05-23 DIAGNOSIS — F332 Major depressive disorder, recurrent severe without psychotic features: Secondary | ICD-10-CM

## 2022-05-23 DIAGNOSIS — Z79899 Other long term (current) drug therapy: Secondary | ICD-10-CM

## 2022-05-23 DIAGNOSIS — F410 Panic disorder [episodic paroxysmal anxiety] without agoraphobia: Secondary | ICD-10-CM

## 2022-05-23 MED ORDER — PRAZOSIN HCL 1 MG PO CAPS: 3.0000 mg | ORAL_CAPSULE | Freq: Every day | ORAL | 1 refills | Status: DC

## 2022-05-23 NOTE — Patient Instructions (Signed)
We didn't make any medication changes today in order to give current dose of remeron more time to work.
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
## 2022-05-26 ENCOUNTER — Other Ambulatory Visit (HOSPITAL_COMMUNITY): Payer: Self-pay | Admitting: Psychiatry

## 2022-05-26 DIAGNOSIS — G4709 Other insomnia: Secondary | ICD-10-CM

## 2022-05-26 DIAGNOSIS — F4310 Post-traumatic stress disorder, unspecified: Secondary | ICD-10-CM

## 2022-05-27 ENCOUNTER — Other Ambulatory Visit (HOSPITAL_COMMUNITY): Payer: Self-pay | Admitting: Psychiatry

## 2022-05-27 DIAGNOSIS — G4709 Other insomnia: Secondary | ICD-10-CM

## 2022-05-27 DIAGNOSIS — F410 Panic disorder [episodic paroxysmal anxiety] without agoraphobia: Secondary | ICD-10-CM

## 2022-05-27 DIAGNOSIS — G43009 Migraine without aura, not intractable, without status migrainosus: Secondary | ICD-10-CM

## 2022-05-27 DIAGNOSIS — F4310 Post-traumatic stress disorder, unspecified: Secondary | ICD-10-CM

## 2022-05-27 MED ORDER — GABAPENTIN 300 MG PO CAPS: ORAL_CAPSULE | ORAL | 0 refills | Status: DC

## 2022-05-27 NOTE — Progress Notes (Signed)
Refill of gabapentin called in per refill request.

## 2022-05-28 ENCOUNTER — Encounter: Payer: No Typology Code available for payment source | Admitting: Neurology

## 2022-05-28 ENCOUNTER — Other Ambulatory Visit (HOSPITAL_COMMUNITY): Payer: Self-pay | Admitting: Psychiatry

## 2022-05-28 DIAGNOSIS — F4310 Post-traumatic stress disorder, unspecified: Secondary | ICD-10-CM

## 2022-05-28 DIAGNOSIS — G4709 Other insomnia: Secondary | ICD-10-CM

## 2022-06-03 ENCOUNTER — Encounter (HOSPITAL_COMMUNITY): Payer: Self-pay

## 2022-06-04 ENCOUNTER — Telehealth: Payer: Self-pay | Admitting: Psychiatry

## 2022-06-04 MED ORDER — QUETIAPINE FUMARATE 25 MG PO TABS
25.0000 mg | ORAL_TABLET | Freq: Every day | ORAL | 0 refills | Status: DC
Start: 2022-06-04 — End: 2022-06-11

## 2022-06-04 NOTE — Telephone Encounter (Addendum)
Called her in response to her message as below.  "Dr. Nehemiah Settle,    Help! My depression has gotten bad again..I'm in a horrible funk. I'm shaking from panic attacks and my meds are not working. I'm not having suicidal thoughts just mind racing 10000 minutes per minute. 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 I'm planning on going to [place] for Christmas but I really don't want to. I'm just really down. Help if possible...not sure if you can. Andrea [name]"  According to the chart review, she has been seen by Dr. Nehemiah Settle since Sept. Medication changes including tapering off olanzapine from 10 mg BID (concern of akathisia, masked face), uptitration of gabapentin, discontinuation of doxepin, and initiation of mirtazapine a few weeks ago. No known structural or functional heart disease.  She states that there has been worsening in down mood,  and she has panic attacks for the past few weeks.  She has racing thoughts.  She sleeps 5 to 6 hours.  She denies decreased need for sleep, euphonia or increased goal directed activity. She denies SI.  She has been doing well at work despite her mood symptoms.  She is not aware of any recent stressors/changes to feel this way. She thinks mirtazapine is helping for insomnia. She denies alcohol/drug use.   There is a concern of relapse in her mood symptoms in the setting of olanzapine taper. After having discussed treatment options, she is willing to try quetiapine to target depression, anxiety.  Quetiapine was chosen this time given its relatively low risk of akathisia, although it has unfavorable metabolic side effect.  Discussed potential risk of metabolic side effect, QTc prolongation, drowsiness.  Noted that she is seen by neurology for non-epileptic events, and she denies any episode of this even for the past several months.   Plan - Start quetiapine 25 mg at night.  (QTc 446 msec, HR 78 on 12/2021.  Repeated EKG is not obtained at  this time due to no known risk of QTc prolongation other than female. May consider rechecking if she were to continue to stay on this medication.) - She verbalized understanding that clinic/Dr. Nehemiah Settle may contact her if he has any other suggestions.  - She was advised to contact the office if any concerns/side effect from quetiapine.

## 2022-06-05 ENCOUNTER — Other Ambulatory Visit (HOSPITAL_COMMUNITY): Payer: Self-pay | Admitting: Psychiatry

## 2022-06-05 DIAGNOSIS — F332 Major depressive disorder, recurrent severe without psychotic features: Secondary | ICD-10-CM

## 2022-06-05 DIAGNOSIS — F445 Conversion disorder with seizures or convulsions: Secondary | ICD-10-CM

## 2022-06-05 DIAGNOSIS — F410 Panic disorder [episodic paroxysmal anxiety] without agoraphobia: Secondary | ICD-10-CM

## 2022-06-05 DIAGNOSIS — F4310 Post-traumatic stress disorder, unspecified: Secondary | ICD-10-CM

## 2022-06-11 ENCOUNTER — Encounter (HOSPITAL_COMMUNITY): Payer: Self-pay

## 2022-06-11 ENCOUNTER — Ambulatory Visit (HOSPITAL_COMMUNITY): Payer: No Typology Code available for payment source | Admitting: Clinical

## 2022-06-11 ENCOUNTER — Other Ambulatory Visit (HOSPITAL_COMMUNITY): Payer: Self-pay | Admitting: Psychiatry

## 2022-06-11 DIAGNOSIS — F4310 Post-traumatic stress disorder, unspecified: Secondary | ICD-10-CM

## 2022-06-11 DIAGNOSIS — F332 Major depressive disorder, recurrent severe without psychotic features: Secondary | ICD-10-CM

## 2022-06-11 DIAGNOSIS — F411 Generalized anxiety disorder: Secondary | ICD-10-CM

## 2022-06-11 DIAGNOSIS — F445 Conversion disorder with seizures or convulsions: Secondary | ICD-10-CM

## 2022-06-11 MED ORDER — MIRTAZAPINE 30 MG PO TABS: 30.0000 mg | ORAL_TABLET | Freq: Every day | ORAL | 0 refills | Status: DC

## 2022-06-11 NOTE — Progress Notes (Signed)
Patient with resumption of more depressed mood during the holidays. Prior benefit from addition of remeron previously, will titrate dose today to '30mg'$  nightly. Will also discontinue seroquel started after last mychart message from covering provider.

## 2022-06-19 ENCOUNTER — Other Ambulatory Visit (HOSPITAL_COMMUNITY): Payer: Self-pay | Admitting: Family Medicine

## 2022-06-19 ENCOUNTER — Other Ambulatory Visit: Payer: Self-pay | Admitting: Neurology

## 2022-06-19 ENCOUNTER — Ambulatory Visit (HOSPITAL_COMMUNITY)
Admission: RE | Admit: 2022-06-19 | Discharge: 2022-06-19 | Disposition: A | Discharge status: Home / Self Care | Payer: No Typology Code available for payment source | Source: Ambulatory Visit | Attending: Family Medicine | Admitting: Family Medicine

## 2022-06-19 DIAGNOSIS — J209 Acute bronchitis, unspecified: Secondary | ICD-10-CM

## 2022-06-19 DIAGNOSIS — R079 Chest pain, unspecified: Secondary | ICD-10-CM | POA: Insufficient documentation

## 2022-06-19 DIAGNOSIS — R062 Wheezing: Secondary | ICD-10-CM

## 2022-06-19 DIAGNOSIS — R051 Acute cough: Secondary | ICD-10-CM

## 2022-06-20 ENCOUNTER — Telehealth (HOSPITAL_COMMUNITY): Payer: No Typology Code available for payment source | Admitting: Psychiatry

## 2022-06-21 ENCOUNTER — Encounter: Payer: Self-pay | Admitting: Neurology

## 2022-06-24 ENCOUNTER — Other Ambulatory Visit (HOSPITAL_COMMUNITY): Payer: Self-pay

## 2022-06-24 ENCOUNTER — Telehealth: Payer: Self-pay | Admitting: Pharmacy Technician

## 2022-06-24 MED ORDER — EMGALITY 120 MG/ML ~~LOC~~ SOAJ: 1.0000 | SUBCUTANEOUS | 11 refills | Status: DC

## 2022-06-24 NOTE — Telephone Encounter (Signed)
PA has been submitted, and telephone encounter has been created. 

## 2022-06-24 NOTE — Telephone Encounter (Signed)
Patient Advocate Encounter  Received notification from [facility] that prior authorization for AIMOVIG '140MG'$  is required.   PA submitted on 1.8.24 Key [id number] Status is pending

## 2022-06-24 NOTE — Telephone Encounter (Signed)
PA has been submitted.

## 2022-06-25 ENCOUNTER — Telehealth (INDEPENDENT_AMBULATORY_CARE_PROVIDER_SITE_OTHER): Payer: No Typology Code available for payment source | Admitting: Psychiatry

## 2022-06-25 DIAGNOSIS — F411 Generalized anxiety disorder: Secondary | ICD-10-CM

## 2022-06-25 DIAGNOSIS — F410 Panic disorder [episodic paroxysmal anxiety] without agoraphobia: Secondary | ICD-10-CM

## 2022-06-25 DIAGNOSIS — F445 Conversion disorder with seizures or convulsions: Secondary | ICD-10-CM

## 2022-06-25 DIAGNOSIS — F332 Major depressive disorder, recurrent severe without psychotic features: Secondary | ICD-10-CM

## 2022-06-25 DIAGNOSIS — Z79899 Other long term (current) drug therapy: Secondary | ICD-10-CM

## 2022-06-25 DIAGNOSIS — F4310 Post-traumatic stress disorder, unspecified: Secondary | ICD-10-CM

## 2022-06-25 DIAGNOSIS — G43009 Migraine without aura, not intractable, without status migrainosus: Secondary | ICD-10-CM | POA: Diagnosis not present

## 2022-06-25 DIAGNOSIS — G4709 Other insomnia: Secondary | ICD-10-CM | POA: Diagnosis not present

## 2022-06-25 MED ORDER — GABAPENTIN 300 MG PO CAPS: ORAL_CAPSULE | ORAL | 0 refills | Status: DC

## 2022-06-25 NOTE — Patient Instructions (Signed)
Try taking your gabapentin second as needed dose with the rest of your bedtime dose to see if this helps with your nighttime panic attacks.  This week decrease your Prozac to 40 mg once daily.  Next week decreased to 20 mg once daily.  And in the next week he will be off of the Prozac for 1 week before we start the Cymbalta.
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
## 2022-06-27 ENCOUNTER — Encounter: Payer: No Typology Code available for payment source | Admitting: Neurology

## 2022-07-04 NOTE — Telephone Encounter (Signed)
Patient told me Aimovig is not covered, so I sent in a prescription for Emgality on 06/24/22. It is on formulary alternatives, do we know if this was approved?

## 2022-07-04 NOTE — Telephone Encounter (Signed)
Patient Advocate Encounter  Received a fax from [facility] regarding Prior Authorization for Aimovig '140MG'$ /ML [place] SOAJ.   Authorization has been DENIED due to .     Full determination letter attached to patient chart

## 2022-07-08 ENCOUNTER — Other Ambulatory Visit (HOSPITAL_COMMUNITY): Payer: Self-pay | Admitting: Psychiatry

## 2022-07-08 DIAGNOSIS — F332 Major depressive disorder, recurrent severe without psychotic features: Secondary | ICD-10-CM

## 2022-07-08 DIAGNOSIS — F411 Generalized anxiety disorder: Secondary | ICD-10-CM

## 2022-07-08 DIAGNOSIS — F4310 Post-traumatic stress disorder, unspecified: Secondary | ICD-10-CM

## 2022-07-08 DIAGNOSIS — F445 Conversion disorder with seizures or convulsions: Secondary | ICD-10-CM

## 2022-07-12 ENCOUNTER — Ambulatory Visit (HOSPITAL_COMMUNITY): Payer: No Typology Code available for payment source | Admitting: Clinical

## 2022-07-16 ENCOUNTER — Encounter (HOSPITAL_COMMUNITY): Payer: Self-pay | Admitting: Psychiatry

## 2022-07-16 ENCOUNTER — Telehealth (INDEPENDENT_AMBULATORY_CARE_PROVIDER_SITE_OTHER): Payer: No Typology Code available for payment source | Admitting: Psychiatry

## 2022-07-16 DIAGNOSIS — G894 Chronic pain syndrome: Secondary | ICD-10-CM

## 2022-07-16 DIAGNOSIS — F332 Major depressive disorder, recurrent severe without psychotic features: Secondary | ICD-10-CM | POA: Diagnosis not present

## 2022-07-16 DIAGNOSIS — F4310 Post-traumatic stress disorder, unspecified: Secondary | ICD-10-CM

## 2022-07-16 DIAGNOSIS — F410 Panic disorder [episodic paroxysmal anxiety] without agoraphobia: Secondary | ICD-10-CM

## 2022-07-16 DIAGNOSIS — Z79899 Other long term (current) drug therapy: Secondary | ICD-10-CM

## 2022-07-16 DIAGNOSIS — F411 Generalized anxiety disorder: Secondary | ICD-10-CM

## 2022-07-16 DIAGNOSIS — F445 Conversion disorder with seizures or convulsions: Secondary | ICD-10-CM | POA: Diagnosis not present

## 2022-07-16 DIAGNOSIS — G8929 Other chronic pain: Secondary | ICD-10-CM | POA: Insufficient documentation

## 2022-07-16 DIAGNOSIS — G4709 Other insomnia: Secondary | ICD-10-CM

## 2022-07-16 MED ORDER — DULOXETINE HCL 30 MG PO CPEP: ORAL_CAPSULE | ORAL | 2 refills | Status: DC

## 2022-07-16 NOTE — Patient Instructions (Addendum)
Imaging as to her medication today. Start Cymbalta 30 mg once daily for 1 week, then 60 mg (2 capsules) daily for 1 week, then 90 mg (3 capsules) daily thereafter.  Please let me know how things are going as we make these changes as we may need to pause depending on how you are responding to the medication at different doses.
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
## 2022-07-18 ENCOUNTER — Other Ambulatory Visit (HOSPITAL_COMMUNITY): Payer: Self-pay

## 2022-07-19 ENCOUNTER — Encounter: Payer: No Typology Code available for payment source | Admitting: Neurology

## 2022-07-29 ENCOUNTER — Other Ambulatory Visit (HOSPITAL_COMMUNITY): Payer: Self-pay | Admitting: Psychiatry

## 2022-07-29 DIAGNOSIS — F332 Major depressive disorder, recurrent severe without psychotic features: Secondary | ICD-10-CM

## 2022-07-29 DIAGNOSIS — G43009 Migraine without aura, not intractable, without status migrainosus: Secondary | ICD-10-CM

## 2022-07-29 DIAGNOSIS — G4709 Other insomnia: Secondary | ICD-10-CM

## 2022-07-29 DIAGNOSIS — G894 Chronic pain syndrome: Secondary | ICD-10-CM

## 2022-07-29 DIAGNOSIS — F410 Panic disorder [episodic paroxysmal anxiety] without agoraphobia: Secondary | ICD-10-CM

## 2022-07-29 DIAGNOSIS — F4310 Post-traumatic stress disorder, unspecified: Secondary | ICD-10-CM

## 2022-07-29 MED ORDER — GABAPENTIN 300 MG PO CAPS: ORAL_CAPSULE | ORAL | 1 refills | Status: DC

## 2022-07-29 NOTE — Telephone Encounter (Signed)
From: [name] To: Office of Jacquelynn Cree, MD Sent: 07/29/2022 9:37 AM EST Subject: Medication Renewal Request  Refills have been requested for the following medications:   gabapentin (NEURONTIN) 300 MG capsule [Brynn Reznik A Cameshia Cressman]  [id number] [place], [place]: [employer]

## 2022-07-30 ENCOUNTER — Ambulatory Visit (HOSPITAL_COMMUNITY): Payer: No Typology Code available for payment source | Admitting: Clinical

## 2022-07-30 ENCOUNTER — Encounter (HOSPITAL_COMMUNITY): Payer: Self-pay

## 2022-08-01 ENCOUNTER — Encounter (HOSPITAL_COMMUNITY): Payer: Self-pay

## 2022-08-02 ENCOUNTER — Telehealth (INDEPENDENT_AMBULATORY_CARE_PROVIDER_SITE_OTHER): Payer: No Typology Code available for payment source | Admitting: Psychiatry

## 2022-08-02 ENCOUNTER — Encounter (HOSPITAL_COMMUNITY): Payer: Self-pay | Admitting: Psychiatry

## 2022-08-02 ENCOUNTER — Other Ambulatory Visit (HOSPITAL_COMMUNITY): Payer: Self-pay | Admitting: Psychiatry

## 2022-08-02 DIAGNOSIS — G43009 Migraine without aura, not intractable, without status migrainosus: Secondary | ICD-10-CM

## 2022-08-02 DIAGNOSIS — F332 Major depressive disorder, recurrent severe without psychotic features: Secondary | ICD-10-CM

## 2022-08-02 DIAGNOSIS — F445 Conversion disorder with seizures or convulsions: Secondary | ICD-10-CM

## 2022-08-02 DIAGNOSIS — F410 Panic disorder [episodic paroxysmal anxiety] without agoraphobia: Secondary | ICD-10-CM

## 2022-08-02 DIAGNOSIS — F411 Generalized anxiety disorder: Secondary | ICD-10-CM

## 2022-08-02 DIAGNOSIS — Z79899 Other long term (current) drug therapy: Secondary | ICD-10-CM

## 2022-08-02 DIAGNOSIS — G4709 Other insomnia: Secondary | ICD-10-CM

## 2022-08-02 DIAGNOSIS — F4310 Post-traumatic stress disorder, unspecified: Secondary | ICD-10-CM

## 2022-08-02 MED ORDER — GABAPENTIN 300 MG PO CAPS: ORAL_CAPSULE | ORAL | 1 refills | Status: DC

## 2022-08-02 MED ORDER — MIRTAZAPINE 45 MG PO TABS: 45.0000 mg | ORAL_TABLET | Freq: Every day | ORAL | 0 refills | Status: DC

## 2022-08-02 NOTE — Patient Instructions (Addendum)
We increased the gabapentin as needed dose by 2 additional pills for a total of 600 mg which she can take if you wake up during the night.  We also increased the Remeron to 45 mg nightly.  Please coordinate with your PCP to get a thyroid panel (TSH, total T3, free T4), a workup for Cushing's disease with a dexamethasone suppression test or a 24-hour urine collection for free cortisol excretion, and urine metanephrines.  If your PCP can also placed a referral for a sleep medicine provider just to make sure I am not missing anything for your insomnia.  It is still possible that she has sleep apnea which could be accounting for some of the insomnia and I am still having to carefully weigh giving more sleep aids which could worsen that.  If they have any questions about these tests please feel free to have them give our office a call.
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
## 2022-08-05 ENCOUNTER — Other Ambulatory Visit: Payer: Self-pay

## 2022-08-05 ENCOUNTER — Encounter: Payer: Self-pay | Admitting: Neurology

## 2022-08-05 ENCOUNTER — Telehealth: Payer: Self-pay

## 2022-08-05 DIAGNOSIS — G43001 Migraine without aura, not intractable, with status migrainosus: Secondary | ICD-10-CM

## 2022-08-05 MED ORDER — PREDNISONE 10 MG (21) PO TBPK: ORAL_TABLET | ORAL | 0 refills | Status: DC

## 2022-08-05 NOTE — Telephone Encounter (Signed)
Called patient and told her of Dr. Karren Burly recommendation and I have sent in the prednisone taper

## 2022-08-05 NOTE — Telephone Encounter (Signed)
This is a my chart message I let patient know Dr. [name] is out of the office but I would sent her request to her colleagues     Dr. [name],     Over the last week, I have had a migraine 5 out of the 7 days. I would take the Imitrex and it would ease off and come right back.   Is there anything we can do or change? Thank you, Andrea [name]

## 2022-08-06 ENCOUNTER — Telehealth (HOSPITAL_COMMUNITY): Payer: No Typology Code available for payment source | Admitting: Psychiatry

## 2022-08-15 ENCOUNTER — Encounter: Payer: No Typology Code available for payment source | Admitting: Neurology

## 2022-08-16 ENCOUNTER — Telehealth (HOSPITAL_COMMUNITY): Payer: No Typology Code available for payment source | Admitting: Psychiatry

## 2022-08-19 ENCOUNTER — Telehealth (INDEPENDENT_AMBULATORY_CARE_PROVIDER_SITE_OTHER): Payer: No Typology Code available for payment source | Admitting: Psychiatry

## 2022-08-19 ENCOUNTER — Encounter (HOSPITAL_COMMUNITY): Payer: Self-pay | Admitting: Psychiatry

## 2022-08-19 DIAGNOSIS — F411 Generalized anxiety disorder: Secondary | ICD-10-CM

## 2022-08-19 DIAGNOSIS — F445 Conversion disorder with seizures or convulsions: Secondary | ICD-10-CM | POA: Diagnosis not present

## 2022-08-19 DIAGNOSIS — Z79899 Other long term (current) drug therapy: Secondary | ICD-10-CM

## 2022-08-19 DIAGNOSIS — F332 Major depressive disorder, recurrent severe without psychotic features: Secondary | ICD-10-CM | POA: Diagnosis not present

## 2022-08-19 DIAGNOSIS — F4310 Post-traumatic stress disorder, unspecified: Secondary | ICD-10-CM

## 2022-08-19 DIAGNOSIS — G894 Chronic pain syndrome: Secondary | ICD-10-CM

## 2022-08-19 DIAGNOSIS — G4709 Other insomnia: Secondary | ICD-10-CM

## 2022-08-19 DIAGNOSIS — F410 Panic disorder [episodic paroxysmal anxiety] without agoraphobia: Secondary | ICD-10-CM

## 2022-08-19 MED ORDER — DULOXETINE HCL 60 MG PO CPEP: 120.0000 mg | ORAL_CAPSULE | Freq: Every day | ORAL | 2 refills | Status: DC

## 2022-08-19 NOTE — Patient Instructions (Signed)
We increased the Cymbalta to 120 mg once daily today.  This should help with the depression, anxiety, panic attacks, suicidal ideation.  For the suicidal ideation, if you are finding that it continues to worsen please let our office know or call [phone] or report to the local emergency department for inpatient stabilization.  We need PCP next asked him about the lab studies that we discussed at her last visit and try to get a sleep physician referral.
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
## 2022-08-23 ENCOUNTER — Other Ambulatory Visit (HOSPITAL_COMMUNITY): Payer: Self-pay | Admitting: Psychiatry

## 2022-08-23 DIAGNOSIS — G4709 Other insomnia: Secondary | ICD-10-CM

## 2022-08-23 DIAGNOSIS — F4310 Post-traumatic stress disorder, unspecified: Secondary | ICD-10-CM

## 2022-08-26 ENCOUNTER — Other Ambulatory Visit (HOSPITAL_COMMUNITY): Payer: Self-pay

## 2022-09-02 ENCOUNTER — Ambulatory Visit (INDEPENDENT_AMBULATORY_CARE_PROVIDER_SITE_OTHER): Payer: No Typology Code available for payment source | Admitting: Clinical

## 2022-09-02 DIAGNOSIS — F332 Major depressive disorder, recurrent severe without psychotic features: Secondary | ICD-10-CM

## 2022-09-02 DIAGNOSIS — F411 Generalized anxiety disorder: Secondary | ICD-10-CM

## 2022-09-02 DIAGNOSIS — F410 Panic disorder [episodic paroxysmal anxiety] without agoraphobia: Secondary | ICD-10-CM

## 2022-09-02 DIAGNOSIS — F4310 Post-traumatic stress disorder, unspecified: Secondary | ICD-10-CM | POA: Diagnosis not present

## 2022-09-02 NOTE — Progress Notes (Signed)
Virtual Visit via Video Note   I connected with Andrea [name] on 09/02/22 at 10:00 AM EDT by a video enabled telemedicine application and verified that I am speaking with the correct person using two identifiers.   Location: Patient: Home Provider: Office   I discussed the limitations of evaluation and management by telemedicine and the availability of in person appointments. The patient expressed understanding and agreed to proceed.     THERAPIST PROGRESS NOTE   Session Time: 10:00 AM-10:30 AM   Participation Level: Active   Behavioral Response: CasualAlertDepressed   Type of Therapy: Individual Therapy   Treatment Goals addressed: Coping for Depression and  and PTSD   Interventions: CBT, Motivational Interviewing, Strength-based and Supportive   Summary: Andrea [name] is a 42 y.o. female who presents with Depression /Anxiety/ PTSD.The OPT therapist worked with the patient for her ongoing outpatient. OPT treatment. The OPT therapist utilized Motivational Interviewing to assist in creating therapeutic repore. The patient in the session was engaged and work in collaboration giving feedback about her triggers and symptoms over the past few weeks. 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 The patient spoke about her ongoing work to regulate her sleep cycle and will be doing a sleep study to determine if she has sleep apnea. The OPT therapist utilized Cognitive Behavioral Therapy through cognitive restructuring as well as worked with the patient on self awareness and implementing coping strategies to assist in management of current stressors.The patient spoke about her work with Dr. Nehemiah Settle in her med therapy program. The OPT therapist overviewed upcoming health appointments with the patient as listed in the patients MyChart including upcoming med management appointment with Dr. Nehemiah Settle  tomorrow.   Suicidal/Homicidal: Nowithout intent/plan   Therapist Response: The OPT therapist worked with the patient for the patients scheduled session. The patient was engaged in her session and gave feedback in relation to triggers, symptoms, and behavior responses over the past few weeks.The OPT therapist worked with the patient utilizing an in session Cognitive Behavioral Therapy exercise. 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   The patient continues to struggle with regulating her sleep cycle and will be over-viewing with her PCP in request for sleep study referral.The OPT therapist worked with the patient on self check-ins throughout her week, regulating her sleep cycle, and having open communication with partner. The patient spoke about planning to go to [facility] for vacation during the school semesters upcoming Spring Break at the beginning of April.    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 Patient/Guardian was advised if they have not already done so to contact the registration department to sign all necessary forms in order for [place] to release information regarding their care.    Consent: Patient/Guardian gives verbal consent for treatment  and assignment of benefits for services provided during this visit. Patient/Guardian expressed understanding and agreed to proceed.    I discussed the assessment and treatment plan with the patient. The patient was provided an opportunity to ask questions and all were answered. The patient agreed with  the plan and demonstrated an understanding of the instructions.   The patient was advised to call back or seek an in-person evaluation if the symptoms worsen or if the condition fails to improve as anticipated.   I provided 30 minutes of non-face-to-face time during this encounter.     Lennox Grumbles, LCSW   09/02/2022

## 2022-09-03 ENCOUNTER — Telehealth (INDEPENDENT_AMBULATORY_CARE_PROVIDER_SITE_OTHER): Payer: No Typology Code available for payment source | Admitting: Psychiatry

## 2022-09-03 ENCOUNTER — Encounter (HOSPITAL_COMMUNITY): Payer: Self-pay | Admitting: Psychiatry

## 2022-09-03 DIAGNOSIS — F411 Generalized anxiety disorder: Secondary | ICD-10-CM | POA: Diagnosis not present

## 2022-09-03 DIAGNOSIS — F410 Panic disorder [episodic paroxysmal anxiety] without agoraphobia: Secondary | ICD-10-CM

## 2022-09-03 DIAGNOSIS — G4709 Other insomnia: Secondary | ICD-10-CM

## 2022-09-03 DIAGNOSIS — F4310 Post-traumatic stress disorder, unspecified: Secondary | ICD-10-CM

## 2022-09-03 DIAGNOSIS — Z79899 Other long term (current) drug therapy: Secondary | ICD-10-CM

## 2022-09-03 DIAGNOSIS — F332 Major depressive disorder, recurrent severe without psychotic features: Secondary | ICD-10-CM
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
## 2022-09-03 NOTE — Patient Instructions (Signed)
We did not make any medication changes today.  I am glad that you are doing better.  When you see your PCP next they may want to consider getting the blood work that we have previously discussed.  Also, when it is financially feasible continue to consider getting a sleep study done.

## 2022-09-11 ENCOUNTER — Telehealth: Payer: No Typology Code available for payment source | Admitting: Physician Assistant

## 2022-09-11 ENCOUNTER — Other Ambulatory Visit (HOSPITAL_COMMUNITY): Payer: Self-pay | Admitting: Psychiatry

## 2022-09-11 DIAGNOSIS — H66001 Acute suppurative otitis media without spontaneous rupture of ear drum, right ear: Secondary | ICD-10-CM | POA: Diagnosis not present

## 2022-09-11 DIAGNOSIS — F4310 Post-traumatic stress disorder, unspecified: Secondary | ICD-10-CM

## 2022-09-11 DIAGNOSIS — F332 Major depressive disorder, recurrent severe without psychotic features: Secondary | ICD-10-CM

## 2022-09-11 DIAGNOSIS — G894 Chronic pain syndrome: Secondary | ICD-10-CM

## 2022-09-11 DIAGNOSIS — R058 Other specified cough: Secondary | ICD-10-CM

## 2022-09-11 DIAGNOSIS — F410 Panic disorder [episodic paroxysmal anxiety] without agoraphobia: Secondary | ICD-10-CM

## 2022-09-11 MED ORDER — AZITHROMYCIN 250 MG PO TABS: ORAL_TABLET | ORAL | 0 refills | Status: AC

## 2022-09-11 MED ORDER — BENZONATATE 100 MG PO CAPS: 100.0000 mg | ORAL_CAPSULE | Freq: Three times a day (TID) | ORAL | 0 refills | Status: DC | PRN

## 2022-09-11 NOTE — Patient Instructions (Signed)
Andrea [name], thank you for joining Andrea Rio, PA-C for today's virtual visit.  While this provider is not your primary care provider (PCP), if your PCP is located in our provider database this encounter information will be shared with them immediately following your visit.   Andrea [name] account gives you access to today's visit and all your visits, tests, and labs performed at [facility] " click here if you don't have a [facility] account or go to [email or web address]  Consent: (Patient) Andrea [name] provided verbal consent for this virtual visit at the beginning of the encounter.  Current Medications:  Current Outpatient Medications:    albuterol (VENTOLIN HFA) 108 (90 Base) MCG/ACT inhaler, Inhale 1-2 puffs into the lungs every 6 (six) hours as needed for wheezing or shortness of breath., Disp: 18 g, Rfl: 0   Azelastine HCl 137 MCG/SPRAY SOLN, Place 2 sprays into both nostrils 2 (two) times daily., Disp: , Rfl:    DULoxetine (CYMBALTA) 60 MG capsule, Take 2 capsules (120 mg total) by mouth daily., Disp: 60 capsule, Rfl: 2   empagliflozin (JARDIANCE) 25 MG TABS tablet, Take 25 mg by mouth every morning., Disp: , Rfl:    fluticasone (FLONASE) 50 MCG/ACT nasal spray, Place 1 spray into both nostrils daily., Disp: 16 g, Rfl: 0   gabapentin (NEURONTIN) 300 MG capsule, Take 1 pill each morning, one at noon, and 3 at night. As needed: 1 pill twice daily for panic, 2 pills nightly for insomnia., Disp: 270 capsule, Rfl: 1   Galcanezumab-gnlm (EMGALITY) 120 MG/ML SOAJ, Inject 1 Pen into the skin every 30 (thirty) days., Disp: 1.12 mL, Rfl: 11   hydrochlorothiazide (HYDRODIURIL) 25 MG tablet, Take 25 mg by mouth daily as needed for other., Disp: , Rfl:    HYDROcodone-acetaminophen (NORCO) 7.5-325 MG tablet, Take 1 tablet by mouth 3 (three) times daily as needed (pain). Take with benadryl, Disp: , Rfl:    insulin degludec (TRESIBA FLEXTOUCH) 100  UNIT/ML FlexTouch Pen, Inject 20 Units into the skin 2 (two) times daily with a meal., Disp: , Rfl:    levocetirizine (XYZAL) 5 MG tablet, Take 5 mg by mouth at bedtime., Disp: , Rfl:    mirtazapine (REMERON) 45 MG tablet, Take 1 tablet (45 mg total) by mouth at bedtime., Disp: 90 tablet, Rfl: 0   ondansetron (ZOFRAN-ODT) 4 MG disintegrating tablet, Take 1 tablet (4 mg total) by mouth every 8 (eight) hours as needed for nausea or vomiting., Disp: 20 tablet, Rfl: 11   oxybutynin (DITROPAN-XL) 5 MG 24 hr tablet, Take one tablet twice daily (Patient taking differently: Take 5 mg by mouth 2 (two) times daily.), Disp: 60 tablet, Rfl: 2   pantoprazole (PROTONIX) 40 MG tablet, Take 40 mg by mouth every morning., Disp: , Rfl:    prazosin (MINIPRESS) 1 MG capsule, Take 3 capsules (3 mg total) by mouth at bedtime., Disp: 90 capsule, Rfl: 2   Semaglutide (RYBELSUS) 14 MG TABS, Take 14 mg by mouth every morning., Disp: , Rfl:    SUMAtriptan (IMITREX) 100 MG tablet, Take 1 tablet at onset of migraine. May May repeat in 2 hours if headache persists or recurs. Do not take more than 3 a week, Disp: 10 tablet, Rfl: 11   SYMBICORT 160-4.5 MCG/ACT inhaler, Inhale 2 puffs into the lungs., Disp: , Rfl:    tiZANidine (ZANAFLEX) 4 MG tablet, Take 4 mg by mouth every 8 (eight) hours as needed for muscle spasms., Disp: , Rfl:  zonisamide (ZONEGRAN) 100 MG capsule, Take 4 capsules every night, Disp: 120 capsule, Rfl: 6   Medications ordered in this encounter:  No orders of the defined types were placed in this encounter.    *If you need refills on other medications prior to your next appointment, please contact your pharmacy*  Follow-Up: Call back or seek an in-person evaluation if the symptoms worsen or if the condition fails to improve as anticipated.  [facility] [phone]  Other Instructions Please keep hydrated and rest. Start saline nasal rinse. Restart use of your Flonase. You can  continue the Delsym for cough, but can take the Tessalon I have prescribed as well. Take the antibiotic as directed. If symptoms are not resolving, or you note any new or worsening symptoms despite treatment, please seek an in-person evaluation.    If you have been instructed to have an in-person evaluation today at a local Urgent Care facility, please use the link below. It will take you to a list of all of our available [facility], including address, phone number and hours of operation. Please do not delay care.  [facility]  If you or a family member do not have a primary care provider, use the link below to schedule a visit and establish care. When you choose a [facility] primary care physician or advanced practice provider, you gain a long-term partner in health. Find a Primary Care Provider  Learn more about [facility] in-office and virtual care options: [facility]

## 2022-09-11 NOTE — Progress Notes (Signed)
Virtual Visit Consent   [name], you are scheduled for a virtual visit with a [facility] provider today. Just as with appointments in the office, your consent must be obtained to participate. Your consent will be active for this visit and any virtual visit you may have with one of our providers in the next 365 days. If you have a MyChart account, a copy of this consent can be sent to you electronically.  As this is a virtual visit, video technology does not allow for your provider to perform a traditional examination. This may limit your provider's ability to fully assess your condition. If your provider identifies any concerns that need to be evaluated in person or the need to arrange testing (such as labs, EKG, etc.), we will make arrangements to do so. Although advances in technology are sophisticated, we cannot ensure that it will always work on either your end or our end. If the connection with a video visit is poor, the visit may have to be switched to a telephone visit. With either a video or telephone visit, we are not always able to ensure that we have a secure connection.  By engaging in this virtual visit, you consent to the provision of healthcare and authorize for your insurance to be billed (if applicable) for the services provided during this visit. Depending on your insurance coverage, you may receive a charge related to this service.  I need to obtain your verbal consent now. Are you willing to proceed with your visit today? Andrea [name] has provided verbal consent on 09/11/2022 for a virtual visit (video or telephone). [name], [place]  Date: 09/11/2022 12:52 PM  Virtual Visit via Video Note   I, [name], connected with  Andrea [name]  ([id number], Nov 12, 1980) on 09/11/22 at 12:45 PM EDT by a video-enabled telemedicine application and verified that I am speaking with the correct person using two identifiers.  Location: Patient: Virtual  Visit Location Patient: Home Provider: Virtual Visit Location Provider: Home Office   I discussed the limitations of evaluation and management by telemedicine and the availability of in person appointments. The patient expressed understanding and agreed to proceed.    History of Present Illness: Andrea [name] is a 42 y.o. who identifies as a female who was assigned female at birth, and is being seen today for around 2 weeks of lingering URI symptoms after treatment for a sinus infection 3-4 weeks ago that did not fully resolve. Notes continued nasal congestion, cough that is mainly dry. Notes scratchy throat and R ear pressure and pain. Some occasional drainage from the ear with slight muffling of hearing. Denies fever.  Completed an entire course of Augmentin for her sinusitis a few weeks ago.  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 03/13/2022   Other insomnia 03/13/2022   Polypharmacy 03/13/2022   Convulsion ([place]) 11/26/2021   Irregular periods 09/26/2020   Mild persistent asthma with acute exacerbation 06/18/2017   Uncontrolled type 2 diabetes mellitus with hyperglycemia, with long-term current use of insulin ([place]) 09/18/2016   HTN (hypertension) 09/18/2016   Pelvic pain in female 11/14/2015   Acute pyelonephritis  01/16/2013   Migraines 10/30/2012   Obesity, Class III, BMI 40-49.9 (morbid obesity) ([place]) 10/30/2012   Kidney stones     Allergies:  Allergies  Allergen Reactions   Bactrim [Sulfamethoxazole-Trimethoprim] Hives and Itching   Codeine Hives and Itching   Hydrocodone-Acetaminophen Itching    Can take with  benadryl   Medications:  Current Outpatient Medications:    azithromycin (ZITHROMAX) 250 MG tablet, Take 2 tablets on day 1, then 1 tablet daily on days 2 through 5, Disp: 6 tablet, Rfl: 0   benzonatate (TESSALON) 100 MG capsule, Take 1 capsule (100 mg total) by mouth 3 (three) times daily as needed for cough., Disp: 30 capsule, Rfl: 0   albuterol (VENTOLIN HFA) 108 (90 Base) MCG/ACT inhaler, Inhale 1-2 puffs into the lungs every 6 (six) hours as needed for wheezing or shortness of breath., Disp: 18 g, Rfl: 0   Azelastine HCl 137 MCG/SPRAY SOLN, Place 2 sprays into both nostrils 2 (two) times daily., Disp: , Rfl:    DULoxetine (CYMBALTA) 60 MG capsule, Take 2 capsules (120 mg total) by mouth daily., Disp: 60 capsule, Rfl: 2   empagliflozin (JARDIANCE) 25 MG TABS tablet, Take 25 mg by mouth every morning., Disp: , Rfl:    fluticasone (FLONASE) 50 MCG/ACT nasal spray, Place 1 spray into both nostrils daily., Disp: 16 g, Rfl: 0   gabapentin (NEURONTIN) 300 MG capsule, Take 1 pill each morning, one at noon, and 3 at night. As needed: 1 pill twice daily for panic, 2 pills nightly for insomnia., Disp: 270 capsule, Rfl: 1   Galcanezumab-gnlm (EMGALITY) 120 MG/ML SOAJ, Inject 1 Pen into the skin every 30 (thirty) days., Disp: 1.12 mL, Rfl: 11   hydrochlorothiazide (HYDRODIURIL) 25 MG tablet, Take 25 mg by mouth daily as needed for other., Disp: , Rfl:    HYDROcodone-acetaminophen (NORCO) 7.5-325 MG tablet, Take 1 tablet by mouth 3 (three) times daily as needed (pain). Take with benadryl, Disp: , Rfl:    insulin degludec (TRESIBA FLEXTOUCH) 100 UNIT/ML FlexTouch Pen, Inject 20 Units into the skin 2 (two) times daily with a meal., Disp: , Rfl:    levocetirizine (XYZAL) 5 MG tablet, Take 5 mg by mouth at bedtime., Disp: , Rfl:    mirtazapine (REMERON) 45 MG tablet, Take 1 tablet (45 mg total) by mouth at bedtime., Disp: 90 tablet, Rfl: 0   ondansetron (ZOFRAN-ODT) 4 MG disintegrating tablet, Take 1 tablet (4  mg total) by mouth every 8 (eight) hours as needed for nausea or vomiting., Disp: 20 tablet, Rfl: 11   oxybutynin (DITROPAN-XL) 5 MG 24 hr tablet, Take one tablet twice daily (Patient taking differently: Take 5 mg by mouth 2 (two) times daily.), Disp: 60 tablet, Rfl: 2   pantoprazole (PROTONIX) 40 MG tablet, Take 40 mg by mouth every morning., Disp: , Rfl:    prazosin (MINIPRESS) 1 MG capsule, Take 3 capsules (3 mg total) by mouth at bedtime., Disp: 90 capsule, Rfl: 2   Semaglutide (RYBELSUS) 14 MG TABS, Take 14 mg by mouth every morning., Disp: , Rfl:    SUMAtriptan (IMITREX) 100 MG tablet, Take 1 tablet at onset of migraine. May May repeat in 2 hours if headache persists or recurs. Do not take more than 3 a week, Disp: 10 tablet, Rfl: 11   SYMBICORT 160-4.5 MCG/ACT inhaler, Inhale 2 puffs into the lungs., Disp: , Rfl:    tiZANidine (ZANAFLEX) 4 MG tablet, Take 4 mg by mouth every 8 (eight) hours as needed for muscle  spasms., Disp: , Rfl:    zonisamide (ZONEGRAN) 100 MG capsule, Take 4 capsules every night, Disp: 120 capsule, Rfl: 6  Observations/Objective: Patient is well-developed, well-nourished in no acute distress.  Resting comfortably at home.  Head is normocephalic, atraumatic.  No labored breathing. Speech is clear and coherent with logical content.  Patient is alert and oriented at baseline.   Assessment and Plan: 1. Post-viral cough syndrome - benzonatate (TESSALON) 100 MG capsule; Take 1 capsule (100 mg total) by mouth 3 (three) times daily as needed for cough.  Dispense: 30 capsule; Refill: 0  2. Non-recurrent acute suppurative otitis media of right ear without spontaneous rupture of tympanic membrane - azithromycin (ZITHROMAX) 250 MG tablet; Take 2 tablets on day 1, then 1 tablet daily on days 2 through 5  Dispense: 6 tablet; Refill: 0  Supportive measures and OTC medications reviewed. Start Tessalon for cough. Will add-on Azithromycin to take as directed to cover for otitis  media (giving recent penicillin use).   Follow Up Instructions: I discussed the assessment and treatment plan with the patient. The patient was provided an opportunity to ask questions and all were answered. The patient agreed with the plan and demonstrated an understanding of the instructions.  A copy of instructions were sent to the patient via MyChart unless otherwise noted below.   The patient was advised to call back or seek an in-person evaluation if the symptoms worsen or if the condition fails to improve as anticipated.  Time:  I spent 10 minutes with the patient via telehealth technology discussing the above problems/concerns.    [name], PA-C

## 2022-09-19 ENCOUNTER — Ambulatory Visit (INDEPENDENT_AMBULATORY_CARE_PROVIDER_SITE_OTHER): Payer: No Typology Code available for payment source | Admitting: Neurology

## 2022-09-19 ENCOUNTER — Telehealth: Payer: Self-pay

## 2022-09-19 DIAGNOSIS — R29898 Other symptoms and signs involving the musculoskeletal system: Secondary | ICD-10-CM | POA: Diagnosis not present

## 2022-09-19 NOTE — Telephone Encounter (Signed)
Pt called informed nerve testing was normal.

## 2022-09-19 NOTE — Telephone Encounter (Signed)
-----   Message from Alda Berthold, DO sent at 09/19/2022  3:16 PM EDT ----- Please let pt know nerve testing was normal. Thanks.

## 2022-09-19 NOTE — Procedures (Signed)
[facility]  [place], [place]  [place],  [address] Tel: [phone] Fax: [phone] Test Date:  09/19/2022  Patient: Andrea [name] DOB: [birth year] Physician: Narda Amber, DO  Sex: Female Height: 5\' 11"  Ref Phys: Ellouise Newer, MD  ID#: [id number]   Technician:    History: This is a 42 year old female referred for evaluation of bilateral hand weakness.  NCV & EMG Findings: Extensive electrodiagnostic testing of the right upper extremity and additional studies of the left shows:  Bilateral median, ulnar, and mixed palmar sensory responses are within normal limits. Bilateral median and ulnar motor responses are within normal limits. There is no evidence of active or chronic motor axonal loss changes affecting any of the tested muscles.  Motor unit configuration and recruitment pattern is within normal limits.  Impression: This is a normal study of the upper extremities.  In particular, there is no evidence of carpal tunnel syndrome or a cervical radiculopathy.   ___________________________ Narda Amber, DO    Nerve Conduction Studies   Stim Site NR Peak (ms) Norm Peak (ms) O-P Amp (V) Norm O-P Amp  Left Median Anti Sensory (2nd Digit)  32 C  Wrist    3.0 <3.4 35.1 >20  Right Median Anti Sensory (2nd Digit)  32 C  Wrist    2.8 <3.4 34.4 >20  Left Ulnar Anti Sensory (5th Digit)  32 C  Wrist    2.6 <3.1 28.1 >12  Right Ulnar Anti Sensory (5th Digit)  32 C  Wrist    2.5 <3.1 29.7 >12     Stim Site NR Onset (ms) Norm Onset (ms) O-P Amp (mV) Norm O-P Amp Site1 Site2 Delta-0 (ms) Dist (cm) Vel (m/s) Norm Vel (m/s)  Left Median Motor (Abd Poll Brev)  32 C  Wrist    2.9 <3.9 8.2 >6 Elbow Wrist 5.1 30.0 59 >50  Elbow    8.0  7.4         Right Median Motor (Abd Poll Brev)  32 C  Wrist    2.3 <3.9 8.1 >6 Elbow Wrist 5.4 28.0 52 >50  Elbow    7.7  8.2         Left Ulnar Motor (Abd Dig Minimi)  32 C  Wrist    2.2 <3.1 8.1 >7 B Elbow Wrist 3.7  21.0 57 >50  B Elbow    5.9  7.7  A Elbow B Elbow 2.0 10.0 50 >50  A Elbow    7.9  7.6         Right Ulnar Motor (Abd Dig Minimi)  32 C  Wrist    2.0 <3.1 9.6 >7 B Elbow Wrist 3.9 22.0 56 >50  B Elbow    5.9  9.1  A Elbow B Elbow 1.8 10.0 56 >50  A Elbow    7.7  8.2            Stim Site NR Peak (ms) Norm Peak (ms) P-T Amp (V) Site1 Site2 Delta-P (ms) Norm Delta (ms)  Left Median/Ulnar Palm Comparison (Wrist - 8cm)  32 C  Median Palm    1.7 <2.2 80.0 Median Palm Ulnar Palm 0.3   Ulnar Palm    1.4 <2.2 17.5      Right Median/Ulnar Palm Comparison (Wrist - 8cm)  32 C  Median Palm    1.6 <2.2 70.0 Median Palm Ulnar Palm 0.2   Ulnar Palm    1.8 <2.2 13.4  Electromyography   Side Muscle Ins.Act Fibs Fasc Recrt Amp Dur Poly Activation Comment  Right 1stDorInt Nml Nml Nml Nml Nml Nml Nml Nml N/A  Right PronatorTeres Nml Nml Nml Nml Nml Nml Nml Nml N/A  Right Biceps Nml Nml Nml Nml Nml Nml Nml Nml N/A  Right Triceps Nml Nml Nml Nml Nml Nml Nml Nml N/A  Right Deltoid Nml Nml Nml Nml Nml Nml Nml Nml N/A  Left 1stDorInt Nml Nml Nml Nml Nml Nml Nml Nml N/A  Left PronatorTeres Nml Nml Nml Nml Nml Nml Nml Nml N/A  Left Biceps Nml Nml Nml Nml Nml Nml Nml Nml N/A  Left Triceps Nml Nml Nml Nml Nml Nml Nml Nml N/A  Left Deltoid Nml Nml Nml Nml Nml Nml Nml Nml N/A      Waveforms:

## 2022-09-23 ENCOUNTER — Encounter (HOSPITAL_COMMUNITY): Payer: Self-pay

## 2022-09-26 ENCOUNTER — Telehealth: Payer: No Typology Code available for payment source | Admitting: Physician Assistant

## 2022-09-26 DIAGNOSIS — J019 Acute sinusitis, unspecified: Secondary | ICD-10-CM | POA: Diagnosis not present

## 2022-09-26 DIAGNOSIS — B9689 Other specified bacterial agents as the cause of diseases classified elsewhere: Secondary | ICD-10-CM

## 2022-09-26 MED ORDER — DOXYCYCLINE HYCLATE 100 MG PO TABS: 100.0000 mg | ORAL_TABLET | Freq: Two times a day (BID) | ORAL | 0 refills | Status: DC

## 2022-09-26 NOTE — Patient Instructions (Signed)
Andrea RumpfJessica C [name], thank you for joining Andrea ClimesWilliam [name] Andrea Kemmerer, PA-C for today's virtual visit.  While this provider is not your primary care provider (PCP), if your PCP is located in our provider database this encounter information will be shared with them immediately following your visit.   A [facility] MyChart account gives you access to today's visit and all your visits, tests, and labs performed at [facility] " click here if you don't have a [facility] MyChart account or go to [email or web address]  Consent: (Patient) Andrea RumpfJessica C Andrea [name] provided verbal consent for this virtual visit at the beginning of the encounter.  Current Medications:  Current Outpatient Medications:    albuterol (VENTOLIN HFA) 108 (90 Base) MCG/ACT inhaler, Inhale 1-2 puffs into the lungs every 6 (six) hours as needed for wheezing or shortness of breath., Disp: 18 g, Rfl: 0   Azelastine HCl 137 MCG/SPRAY SOLN, Place 2 sprays into both nostrils 2 (two) times daily., Disp: , Rfl:    benzonatate (TESSALON) 100 MG capsule, Take 1 capsule (100 mg total) by mouth 3 (three) times daily as needed for cough., Disp: 30 capsule, Rfl: 0   DULoxetine (CYMBALTA) 60 MG capsule, Take 2 capsules (120 mg total) by mouth daily., Disp: 60 capsule, Rfl: 2   empagliflozin (JARDIANCE) 25 MG TABS tablet, Take 25 mg by mouth every morning., Disp: , Rfl:    fluticasone (FLONASE) 50 MCG/ACT nasal spray, Place 1 spray into both nostrils daily., Disp: 16 g, Rfl: 0   gabapentin (NEURONTIN) 300 MG capsule, Take 1 pill each morning, one at noon, and 3 at night. As needed: 1 pill twice daily for panic, 2 pills nightly for insomnia., Disp: 270 capsule, Rfl: 1   Galcanezumab-gnlm (EMGALITY) 120 MG/ML SOAJ, Inject 1 Pen into the skin every 30 (thirty) days., Disp: 1.12 mL, Rfl: 11   hydrochlorothiazide (HYDRODIURIL) 25 MG tablet, Take 25 mg by mouth daily as needed for other., Disp: , Rfl:    HYDROcodone-acetaminophen (NORCO) 7.5-325 MG  tablet, Take 1 tablet by mouth 3 (three) times daily as needed (pain). Take with benadryl, Disp: , Rfl:    insulin degludec (TRESIBA FLEXTOUCH) 100 UNIT/ML FlexTouch Pen, Inject 20 Units into the skin 2 (two) times daily with a meal., Disp: , Rfl:    levocetirizine (XYZAL) 5 MG tablet, Take 5 mg by mouth at bedtime., Disp: , Rfl:    mirtazapine (REMERON) 45 MG tablet, Take 1 tablet (45 mg total) by mouth at bedtime., Disp: 90 tablet, Rfl: 0   ondansetron (ZOFRAN-ODT) 4 MG disintegrating tablet, Take 1 tablet (4 mg total) by mouth every 8 (eight) hours as needed for nausea or vomiting., Disp: 20 tablet, Rfl: 11   oxybutynin (DITROPAN-XL) 5 MG 24 hr tablet, Take one tablet twice daily (Patient taking differently: Take 5 mg by mouth 2 (two) times daily.), Disp: 60 tablet, Rfl: 2   pantoprazole (PROTONIX) 40 MG tablet, Take 40 mg by mouth every morning., Disp: , Rfl:    prazosin (MINIPRESS) 1 MG capsule, Take 3 capsules (3 mg total) by mouth at bedtime., Disp: 90 capsule, Rfl: 2   Semaglutide (RYBELSUS) 14 MG TABS, Take 14 mg by mouth every morning., Disp: , Rfl:    SUMAtriptan (IMITREX) 100 MG tablet, Take 1 tablet at onset of migraine. May May repeat in 2 hours if headache persists or recurs. Do not take more than 3 a week, Disp: 10 tablet, Rfl: 11   SYMBICORT 160-4.5 MCG/ACT inhaler, Inhale 2 puffs into the lungs.,  Disp: , Rfl:    tiZANidine (ZANAFLEX) 4 MG tablet, Take 4 mg by mouth every 8 (eight) hours as needed for muscle spasms., Disp: , Rfl:    zonisamide (ZONEGRAN) 100 MG capsule, Take 4 capsules every night, Disp: 120 capsule, Rfl: 6   Medications ordered in this encounter:  No orders of the defined types were placed in this encounter.    *If you need refills on other medications prior to your next appointment, please contact your pharmacy*  Follow-Up: Call back or seek an in-person evaluation if the symptoms worsen or if the condition fails to improve as anticipated.  [facility]  Virtual Care [phone]  Other Instructions Please take antibiotic as directed.  Increase fluid intake.  Use Saline nasal spray.  Take a daily multivitamin. Restart Flonase and Tessalon. Ok to continue your Mucinex.  Place a humidifier in the bedroom.  Please call or return clinic if symptoms are not improving.  Sinusitis Sinusitis is redness, soreness, and swelling (inflammation) of the paranasal sinuses. Paranasal sinuses are air pockets within the bones of your face (beneath the eyes, the middle of the forehead, or above the eyes). In healthy paranasal sinuses, mucus is able to drain out, and air is able to circulate through them by way of your nose. However, when your paranasal sinuses are inflamed, mucus and air can become trapped. This can allow bacteria and other germs to grow and cause infection. Sinusitis can develop quickly and last only a short time (acute) or continue over a long period (chronic). Sinusitis that lasts for more than 12 weeks is considered chronic.  CAUSES  Causes of sinusitis include: Allergies. Structural abnormalities, such as displacement of the cartilage that separates your nostrils (deviated septum), which can decrease the air flow through your nose and sinuses and affect sinus drainage. Functional abnormalities, such as when the small hairs (cilia) that line your sinuses and help remove mucus do not work properly or are not present. SYMPTOMS  Symptoms of acute and chronic sinusitis are the same. The primary symptoms are pain and pressure around the affected sinuses. Other symptoms include: Upper toothache. Earache. Headache. Bad breath. Decreased sense of smell and taste. A cough, which worsens when you are lying flat. Fatigue. Fever. Thick drainage from your nose, which often is green and may contain pus (purulent). Swelling and warmth over the affected sinuses. DIAGNOSIS  Your caregiver will perform a physical exam. During the exam, your caregiver  may: Look in your nose for signs of abnormal growths in your nostrils (nasal polyps). Tap over the affected sinus to check for signs of infection. View the inside of your sinuses (endoscopy) with a special imaging device with a light attached (endoscope), which is inserted into your sinuses. If your caregiver suspects that you have chronic sinusitis, one or more of the following tests may be recommended: Allergy tests. Nasal culture A sample of mucus is taken from your nose and sent to a lab and screened for bacteria. Nasal cytology A sample of mucus is taken from your nose and examined by your caregiver to determine if your sinusitis is related to an allergy. TREATMENT  Most cases of acute sinusitis are related to a viral infection and will resolve on their own within 10 days. Sometimes medicines are prescribed to help relieve symptoms (pain medicine, decongestants, nasal steroid sprays, or saline sprays).  However, for sinusitis related to a bacterial infection, your caregiver will prescribe antibiotic medicines. These are medicines that will help kill the  bacteria causing the infection.  Rarely, sinusitis is caused by a fungal infection. In theses cases, your caregiver will prescribe antifungal medicine. For some cases of chronic sinusitis, surgery is needed. Generally, these are cases in which sinusitis recurs more than 3 times per year, despite other treatments. HOME CARE INSTRUCTIONS  Drink plenty of water. Water helps thin the mucus so your sinuses can drain more easily. Use a humidifier. Inhale steam 3 to 4 times a day (for example, sit in the bathroom with the shower running). Apply a warm, moist washcloth to your face 3 to 4 times a day, or as directed by your caregiver. Use saline nasal sprays to help moisten and clean your sinuses. Take over-the-counter or prescription medicines for pain, discomfort, or fever only as directed by your caregiver. SEEK IMMEDIATE MEDICAL CARE IF: You  have increasing pain or severe headaches. You have nausea, vomiting, or drowsiness. You have swelling around your face. You have vision problems. You have a stiff neck. You have difficulty breathing. MAKE SURE YOU:  Understand these instructions. Will watch your condition. Will get help right away if you are not doing well or get worse. Document Released: 06/03/2005 Document Revised: 08/26/2011 Document Reviewed: 06/18/2011 [facility] Patient Information 2014 [place], [place].    If you have been instructed to have an in-person evaluation today at a local Urgent Care facility, please use the link below. It will take you to a list of all of our available [facility], including address, phone number and hours of operation. Please do not delay care.  [facility]  If you or a family member do not have a primary care provider, use the link below to schedule a visit and establish care. When you choose a [facility] primary care physician or advanced practice provider, you gain a long-term partner in health. Find a Primary Care Provider  Learn more about [facility] in-office and virtual care options: [facility] - Get Care Now

## 2022-09-26 NOTE — Progress Notes (Signed)
Virtual Visit Consent   Andrea [name], you are scheduled for a virtual visit with a [facility] provider today. Just as with appointments in the office, your consent must be obtained to participate. Your consent will be active for this visit and any virtual visit you may have with one of our providers in the next 365 days. If you have a MyChart account, a copy of this consent can be sent to you electronically.  As this is a virtual visit, video technology does not allow for your provider to perform a traditional examination. This may limit your provider's ability to fully assess your condition. If your provider identifies any concerns that need to be evaluated in person or the need to arrange testing (such as labs, EKG, etc.), we will make arrangements to do so. Although advances in technology are sophisticated, we cannot ensure that it will always work on either your end or our end. If the connection with a video visit is poor, the visit may have to be switched to a telephone visit. With either a video or telephone visit, we are not always able to ensure that we have a secure connection.  By engaging in this virtual visit, you consent to the provision of healthcare and authorize for your insurance to be billed (if applicable) for the services provided during this visit. Depending on your insurance coverage, you may receive a charge related to this service.  I need to obtain your verbal consent now. Are you willing to proceed with your visit today? Andrea [name] has provided verbal consent on 09/26/2022 for a virtual visit (video or telephone). Andrea [name], [place]-C  Date: 09/26/2022 11:55 AM  Virtual Visit via Video Note   I, Andrea ClimesWilliam Cody Keylon Labelle, connected with  Andrea [name]  ([id number], [birth year]) on 09/26/22 at 11:45 AM EDT by a video-enabled telemedicine application and verified that I am speaking with the correct person using two identifiers.  Location: Patient: Virtual  Visit Location Patient: Home Provider: Virtual Visit Location Provider: Home Office   I discussed the limitations of evaluation and management by telemedicine and the availability of in person appointments. The patient expressed understanding and agreed to proceed.    History of Present Illness: Andrea [name] is a 42 y.o. who identifies as a female who was assigned female at birth, and is being seen today for possible sinusitis with sinus pressure, nasal congestion, head congestion, sinus pain. Low-grade fever today only. Some mild chest congestion. Symptoms have been present over the past week.   OTC -- Ibuprofen for pain, Mucinex for congestion.   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 03/13/2022   Other insomnia 03/13/2022   Polypharmacy 03/13/2022   Convulsion 11/26/2021   Irregular periods 09/26/2020   Mild persistent asthma with acute exacerbation 06/18/2017   Uncontrolled type 2 diabetes mellitus with hyperglycemia, with long-term current use of insulin 09/18/2016   HTN (hypertension) 09/18/2016   Pelvic pain in female 11/14/2015   Acute pyelonephritis 01/16/2013   Migraines 10/30/2012   Obesity, Class III, BMI 40-49.9 (morbid obesity) (HCC) 10/30/2012   Kidney stones     Allergies:  Allergies  Allergen Reactions   Bactrim [Sulfamethoxazole-Trimethoprim] Hives and  Itching   Codeine Hives and Itching   Hydrocodone-Acetaminophen Itching    Can take with benadryl   Medications:  Current Outpatient Medications:    doxycycline (VIBRA-TABS) 100 MG tablet, Take 1 tablet (100 mg total) by mouth 2 (two) times daily., Disp: 20 tablet, Rfl: 0    albuterol (VENTOLIN HFA) 108 (90 Base) MCG/ACT inhaler, Inhale 1-2 puffs into the lungs every 6 (six) hours as needed for wheezing or shortness of breath., Disp: 18 g, Rfl: 0   Azelastine HCl 137 MCG/SPRAY SOLN, Place 2 sprays into both nostrils 2 (two) times daily., Disp: , Rfl:    DULoxetine (CYMBALTA) 60 MG capsule, Take 2 capsules (120 mg total) by mouth daily., Disp: 60 capsule, Rfl: 2   empagliflozin (JARDIANCE) 25 MG TABS tablet, Take 25 mg by mouth every morning., Disp: , Rfl:    gabapentin (NEURONTIN) 300 MG capsule, Take 1 pill each morning, one at noon, and 3 at night. As needed: 1 pill twice daily for panic, 2 pills nightly for insomnia., Disp: 270 capsule, Rfl: 1   Galcanezumab-gnlm (EMGALITY) 120 MG/ML SOAJ, Inject 1 Pen into the skin every 30 (thirty) days., Disp: 1.12 mL, Rfl: 11   hydrochlorothiazide (HYDRODIURIL) 25 MG tablet, Take 25 mg by mouth daily as needed for other., Disp: , Rfl:    HYDROcodone-acetaminophen (NORCO) 7.5-325 MG tablet, Take 1 tablet by mouth 3 (three) times daily as needed (pain). Take with benadryl, Disp: , Rfl:    insulin degludec (TRESIBA FLEXTOUCH) 100 UNIT/ML FlexTouch Pen, Inject 20 Units into the skin 2 (two) times daily with a meal., Disp: , Rfl:    levocetirizine (XYZAL) 5 MG tablet, Take 5 mg by mouth at bedtime., Disp: , Rfl:    mirtazapine (REMERON) 45 MG tablet, Take 1 tablet (45 mg total) by mouth at bedtime., Disp: 90 tablet, Rfl: 0   ondansetron (ZOFRAN-ODT) 4 MG disintegrating tablet, Take 1 tablet (4 mg total) by mouth every 8 (eight) hours as needed for nausea or vomiting., Disp: 20 tablet, Rfl: 11   oxybutynin (DITROPAN-XL) 5 MG 24 hr tablet, Take one tablet twice daily (Patient taking differently: Take 5 mg by mouth 2 (two) times daily.), Disp: 60 tablet, Rfl: 2   pantoprazole (PROTONIX) 40 MG tablet, Take 40 mg by mouth every morning., Disp: , Rfl:    prazosin (MINIPRESS) 1 MG capsule, Take 3 capsules (3 mg total) by mouth at bedtime.,  Disp: 90 capsule, Rfl: 2   Semaglutide (RYBELSUS) 14 MG TABS, Take 14 mg by mouth every morning., Disp: , Rfl:    SUMAtriptan (IMITREX) 100 MG tablet, Take 1 tablet at onset of migraine. May May repeat in 2 hours if headache persists or recurs. Do not take more than 3 a week, Disp: 10 tablet, Rfl: 11   SYMBICORT 160-4.5 MCG/ACT inhaler, Inhale 2 puffs into the lungs., Disp: , Rfl:    tiZANidine (ZANAFLEX) 4 MG tablet, Take 4 mg by mouth every 8 (eight) hours as needed for muscle spasms., Disp: , Rfl:    zonisamide (ZONEGRAN) 100 MG capsule, Take 4 capsules every night, Disp: 120 capsule, Rfl: 6  Observations/Objective: Patient is well-developed, well-nourished in no acute distress.  Resting comfortably at home.  Head is normocephalic, atraumatic.  No labored breathing.  Speech is clear and coherent with logical content.  Patient is alert and oriented at baseline.   Assessment and Plan: 1. Acute bacterial sinusitis - doxycycline (VIBRA-TABS) 100 MG tablet; Take 1 tablet (100 mg total) by mouth 2 (  two) times daily.  Dispense: 20 tablet; Refill: 0  Rx Doxycycline.  Increase fluids.  Rest.  Saline nasal spray.  Probiotic.  Mucinex as directed.  Humidifier in bedroom. Can restart Tessalon and Flonase she has at home.  Call or return to clinic if symptoms are not improving.   Follow Up Instructions: I discussed the assessment and treatment plan with the patient. The patient was provided an opportunity to ask questions and all were answered. The patient agreed with the plan and demonstrated an understanding of the instructions.  A copy of instructions were sent to the patient via MyChart unless otherwise noted below.   The patient was advised to call back or seek an in-person evaluation if the symptoms worsen or if the condition fails to improve as anticipated.  Time:  I spent 10 minutes with the patient via telehealth technology discussing the above problems/concerns.    Andrea Climes,  PA-C

## 2022-10-02 ENCOUNTER — Telehealth (INDEPENDENT_AMBULATORY_CARE_PROVIDER_SITE_OTHER): Payer: No Typology Code available for payment source | Admitting: Psychiatry

## 2022-10-02 ENCOUNTER — Encounter (HOSPITAL_COMMUNITY): Payer: Self-pay | Admitting: Psychiatry

## 2022-10-02 DIAGNOSIS — F410 Panic disorder [episodic paroxysmal anxiety] without agoraphobia: Secondary | ICD-10-CM | POA: Diagnosis not present

## 2022-10-02 DIAGNOSIS — F411 Generalized anxiety disorder: Secondary | ICD-10-CM | POA: Diagnosis not present

## 2022-10-02 DIAGNOSIS — G4709 Other insomnia: Secondary | ICD-10-CM

## 2022-10-02 DIAGNOSIS — F445 Conversion disorder with seizures or convulsions: Secondary | ICD-10-CM

## 2022-10-02 DIAGNOSIS — F4310 Post-traumatic stress disorder, unspecified: Secondary | ICD-10-CM | POA: Diagnosis not present

## 2022-10-02 DIAGNOSIS — G43009 Migraine without aura, not intractable, without status migrainosus: Secondary | ICD-10-CM

## 2022-10-02 DIAGNOSIS — Z79899 Other long term (current) drug therapy: Secondary | ICD-10-CM

## 2022-10-02 DIAGNOSIS — F331 Major depressive disorder, recurrent, moderate: Secondary | ICD-10-CM

## 2022-10-02 MED ORDER — GABAPENTIN 300 MG PO CAPS: ORAL_CAPSULE | ORAL | 1 refills | Status: DC

## 2022-10-02 MED ORDER — MIRTAZAPINE 45 MG PO TABS: 45.0000 mg | ORAL_TABLET | Freq: Every day | ORAL | 0 refills | Status: DC

## 2022-10-02 MED ORDER — HYDROXYZINE HCL 25 MG PO TABS: 25.0000 mg | ORAL_TABLET | Freq: Three times a day (TID) | ORAL | 1 refills | Status: DC | PRN
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
## 2022-10-02 NOTE — Patient Instructions (Signed)
We decreased her gabapentin to no longer includes 300 mg twice daily as needed doses for panic during the day and will instead replace that with hydroxyzine 25 mg 3 times a day as needed for panic attacks.  If you can, reach out to your therapist to see if they can meet on a weekly basis to get more insight on what may be causing your panic and anxiety.

## 2022-10-03 DIAGNOSIS — Z794 Long term (current) use of insulin: Secondary | ICD-10-CM | POA: Insufficient documentation

## 2022-10-03 DIAGNOSIS — S92252A Displaced fracture of navicular [scaphoid] of left foot, initial encounter for closed fracture: Secondary | ICD-10-CM | POA: Diagnosis not present

## 2022-10-03 DIAGNOSIS — E876 Hypokalemia: Secondary | ICD-10-CM | POA: Insufficient documentation

## 2022-10-03 DIAGNOSIS — E119 Type 2 diabetes mellitus without complications: Secondary | ICD-10-CM | POA: Insufficient documentation

## 2022-10-03 DIAGNOSIS — I10 Essential (primary) hypertension: Secondary | ICD-10-CM | POA: Insufficient documentation

## 2022-10-03 DIAGNOSIS — R55 Syncope and collapse: Secondary | ICD-10-CM | POA: Insufficient documentation

## 2022-10-03 DIAGNOSIS — Y92009 Unspecified place in unspecified non-institutional (private) residence as the place of occurrence of the external cause: Secondary | ICD-10-CM | POA: Insufficient documentation

## 2022-10-03 DIAGNOSIS — W19XXXA Unspecified fall, initial encounter: Secondary | ICD-10-CM | POA: Diagnosis not present

## 2022-10-03 DIAGNOSIS — Z79899 Other long term (current) drug therapy: Secondary | ICD-10-CM | POA: Insufficient documentation

## 2022-10-03 DIAGNOSIS — S8992XA Unspecified injury of left lower leg, initial encounter: Secondary | ICD-10-CM | POA: Diagnosis present

## 2022-10-03 DIAGNOSIS — Z7951 Long term (current) use of inhaled steroids: Secondary | ICD-10-CM | POA: Insufficient documentation

## 2022-10-03 DIAGNOSIS — J45909 Unspecified asthma, uncomplicated: Secondary | ICD-10-CM | POA: Insufficient documentation

## 2022-10-03 DIAGNOSIS — S01511A Laceration without foreign body of lip, initial encounter: Secondary | ICD-10-CM | POA: Diagnosis not present

## 2022-10-03 DIAGNOSIS — Z7984 Long term (current) use of oral hypoglycemic drugs: Secondary | ICD-10-CM | POA: Insufficient documentation

## 2022-10-03 DIAGNOSIS — S72435A Nondisplaced fracture of medial condyle of left femur, initial encounter for closed fracture: Secondary | ICD-10-CM | POA: Diagnosis not present

## 2022-10-04 ENCOUNTER — Emergency Department (HOSPITAL_COMMUNITY): Payer: No Typology Code available for payment source

## 2022-10-04 ENCOUNTER — Emergency Department (HOSPITAL_COMMUNITY)
Admission: EM | Admit: 2022-10-04 | Discharge: 2022-10-04 | Disposition: A | Discharge status: Home / Self Care | Payer: No Typology Code available for payment source | Attending: Emergency Medicine | Admitting: Emergency Medicine

## 2022-10-04 ENCOUNTER — Encounter (HOSPITAL_COMMUNITY): Payer: Self-pay | Admitting: Emergency Medicine

## 2022-10-04 ENCOUNTER — Other Ambulatory Visit: Payer: Self-pay

## 2022-10-04 DIAGNOSIS — E876 Hypokalemia: Secondary | ICD-10-CM

## 2022-10-04 DIAGNOSIS — S72415A Nondisplaced unspecified condyle fracture of lower end of left femur, initial encounter for closed fracture: Secondary | ICD-10-CM

## 2022-10-04 DIAGNOSIS — S92252A Displaced fracture of navicular [scaphoid] of left foot, initial encounter for closed fracture: Secondary | ICD-10-CM

## 2022-10-04 LAB — COMPREHENSIVE METABOLIC PANEL
ALT: 38 U/L (ref 0–44)
AST: 38 U/L (ref 15–41)
Albumin: 4.4 g/dL (ref 3.5–5.0)
Alkaline Phosphatase: 102 U/L (ref 38–126)
Anion gap: 16 — ABNORMAL HIGH (ref 5–15)
BUN: 15 mg/dL (ref 6–20)
CO2: 26 mmol/L (ref 22–32)
Calcium: 9.4 mg/dL (ref 8.9–10.3)
Chloride: 92 mmol/L — ABNORMAL LOW (ref 98–111)
Creatinine, Ser: 1.08 mg/dL — ABNORMAL HIGH (ref 0.44–1.00)
GFR, Estimated: >60 mL/min (ref >60)
Glucose, Bld: 229 mg/dL — ABNORMAL HIGH (ref 70–99)
Potassium: 2.4 mmol/L — CL (ref 3.5–5.1)
Sodium: 134 mmol/L — ABNORMAL LOW (ref 135–145)
Total Bilirubin: 0.9 mg/dL (ref 0.3–1.2)
Total Protein: 7.5 g/dL (ref 6.5–8.1)

## 2022-10-04 LAB — CBC WITH DIFFERENTIAL/PLATELET
Abs Immature Granulocytes: 0.06 10*3/uL (ref 0.00–0.07)
Basophils Absolute: 0.1 10*3/uL (ref 0.0–0.1)
Basophils Relative: 1 %
Eosinophils Absolute: 0.1 10*3/uL (ref 0.0–0.5)
Eosinophils Relative: 2 %
HCT: 43.6 % (ref 36.0–46.0)
Hemoglobin: 14.5 g/dL (ref 12.0–15.0)
Immature Granulocytes: 1 %
Lymphocytes Relative: 29 %
Lymphs Abs: 2.8 10*3/uL (ref 0.7–4.0)
MCH: 30.5 pg (ref 26.0–34.0)
MCHC: 33.3 g/dL (ref 30.0–36.0)
MCV: 91.8 fL (ref 80.0–100.0)
Monocytes Absolute: 0.7 10*3/uL (ref 0.1–1.0)
Monocytes Relative: 7 %
Neutro Abs: 5.8 10*3/uL (ref 1.7–7.7)
Neutrophils Relative %: 60 %
Platelets: 232 10*3/uL (ref 150–400)
RBC: 4.75 MIL/uL (ref 3.87–5.11)
RDW: 13.1 % (ref 11.5–15.5)
WBC: 9.4 10*3/uL (ref 4.0–10.5)
nRBC: 0 % (ref 0.0–0.2)

## 2022-10-04 LAB — MAGNESIUM: Magnesium: 2.2 mg/dL (ref 1.7–2.4)

## 2022-10-04 LAB — POTASSIUM: Potassium: 3.1 mmol/L — ABNORMAL LOW (ref 3.5–5.1)

## 2022-10-04 LAB — HCG, QUANTITATIVE, PREGNANCY: hCG, Beta Chain, Quant, S: 3 m[IU]/mL (ref <5)

## 2022-10-04 MED ORDER — FENTANYL CITRATE (PF) 100 MCG/2ML IJ SOLN
100.0000 ug | Freq: Once | INTRAMUSCULAR | Status: AC
  Administered 2022-10-04: 100 ug via INTRAVENOUS
  Filled 2022-10-04: qty 2

## 2022-10-04 MED ORDER — POTASSIUM CHLORIDE 20 MEQ PO PACK
40.0000 meq | PACK | Freq: Once | ORAL | Status: AC
  Administered 2022-10-04: 40 meq via ORAL
  Filled 2022-10-04: qty 2

## 2022-10-04 MED ORDER — POTASSIUM CHLORIDE 10 MEQ/100ML IV SOLN
10.0000 meq | INTRAVENOUS | Status: AC
  Administered 2022-10-04 (×3): 10 meq via INTRAVENOUS
  Filled 2022-10-04 (×3): qty 100

## 2022-10-04 MED ORDER — OXYCODONE-ACETAMINOPHEN 5-325 MG PO TABS: 2.0000 | ORAL_TABLET | ORAL | 0 refills | Status: DC | PRN

## 2022-10-04 MED ORDER — POTASSIUM CHLORIDE CRYS ER 20 MEQ PO TBCR: 40.0000 meq | EXTENDED_RELEASE_TABLET | Freq: Two times a day (BID) | ORAL | 0 refills | Status: DC

## 2022-10-04 NOTE — ED Notes (Signed)
Cam boot and knee immobilizer applied and pt instructed on how to apply; pt verbalized correct usage  Crutches adjusted to correct height and pt demonstrated correct use and states she has used crutches in the past  Pt discharged via wheelchair

## 2022-10-04 NOTE — Discharge Instructions (Signed)
Please follow up with orthopedics about your two fractures. No weight bearing on that leg until that appointment. You don't have to use the braces if you don't want to but don't bear weight.  Please take potassium as directed and follow up for recheck of your potassium and discussion about continuing Hydrochlorothiazide or changing to something else with your primary doctor in 5-7 days.

## 2022-10-04 NOTE — ED Triage Notes (Signed)
Pt had unwitnessed fall tonight at home. Spouse says he went outside and hear a bang then pt screaming. Pt states she doesn't remember falling. Hx of PTSD seizures and syncope.   Pt c/o nose pain, blood note to lips, unsure if she bit her tongue. Also c/o bilateral ankle pain.

## 2022-10-04 NOTE — ED Provider Notes (Signed)
[facility] AT [facility] Provider Note   CSN: [id number] Arrival date & time: 10/03/22  2339     History  Chief Complaint  Patient presents with   Marletta Lor    Andrea [name] is a 42 y.o. female.  42 year old female with a history of nonepileptic seizures and syncopal episodes along with diabetes, hypertension and asthma who presents ER today after loss of consciousness.  Patient states that she stood up and walked in the kitchen to get supper she saw thing go black and fell down.  Her husband heard a thud and then within a few seconds heard her screaming for him to come help.  He went in there and she had blood in her mouth and there was blood on the ground but she was awake and alert.  He states that normally she has one of her nonepileptic seizures she will be a unresponsive or confused for few minutes afterwards.  Patient states she has been syncopized many times in the past without a obvious cause to be found.  She been eating and drinking okay no recent medication changes.  She states some pain to her left leg mostly around her knee and ankle and also her right knee.  Her right ankle initially hurt but feels better now no pain there.  Some cuts on the inner side of her lower lip but no other facial injuries.   Fall       Home Medications Prior to Admission medications   Medication Sig Start Date End Date Taking? Authorizing Provider  potassium chloride SA (KLOR-CON M) 20 MEQ tablet Take 2 tablets (40 mEq total) by mouth 2 (two) times daily for 7 days. 10/04/22 10/11/22 Yes Rohil Lesch, Barbara Cower, MD  albuterol (VENTOLIN HFA) 108 (90 Base) MCG/ACT inhaler Inhale 1-2 puffs into the lungs every 6 (six) hours as needed for wheezing or shortness of breath. 04/15/21   Wallis Bamberg, PA-C  Azelastine HCl 137 MCG/SPRAY SOLN Place 2 sprays into both nostrils 2 (two) times daily. 08/28/22   [provider]  doxycycline (VIBRA-TABS) 100 MG tablet Take 1 tablet  (100 mg total) by mouth 2 (two) times daily. 09/26/22   Waldon Merl, PA-C  DULoxetine (CYMBALTA) 60 MG capsule Take 2 capsules (120 mg total) by mouth daily. 08/19/22   Elsie Lincoln, MD  empagliflozin (JARDIANCE) 25 MG TABS tablet Take 25 mg by mouth every morning.    [provider]  gabapentin (NEURONTIN) 300 MG capsule Take 1 pill each morning, one at noon, and 3 at night. As needed: 2 pills nightly for insomnia. 10/02/22   Elsie Lincoln, MD  Galcanezumab-gnlm (EMGALITY) 120 MG/ML SOAJ Inject 1 Pen into the skin every 30 (thirty) days. 06/24/22   Van Clines, MD  hydrochlorothiazide (HYDRODIURIL) 25 MG tablet Take 25 mg by mouth daily as needed for other.    [provider]  HYDROcodone-acetaminophen (NORCO) 7.5-325 MG tablet Take 1 tablet by mouth 3 (three) times daily as needed (pain). Take with benadryl 09/20/21   [provider]  hydrOXYzine (ATARAX) 25 MG tablet Take 1 tablet (25 mg total) by mouth 3 (three) times daily as needed for anxiety. 10/02/22   Elsie Lincoln, MD  insulin degludec (TRESIBA FLEXTOUCH) 100 UNIT/ML FlexTouch Pen Inject 20 Units into the skin 2 (two) times daily with a meal.    [provider]  levocetirizine (XYZAL) 5 MG tablet Take 5 mg by mouth at bedtime.    [provider]  mirtazapine (REMERON) 45 MG tablet Take 1 tablet (45 mg total) by mouth at bedtime. 10/02/22 12/31/22  Elsie Lincoln, MD  ondansetron (ZOFRAN-ODT) 4 MG disintegrating tablet Take 1 tablet (4 mg total) by mouth every 8 (eight) hours as needed for nausea or vomiting. 04/23/22   Van Clines, MD  oxybutynin (DITROPAN-XL) 5 MG 24 hr tablet Take one tablet twice daily Patient taking differently: Take 5 mg by mouth 2 (two) times daily. 09/04/17   Babs Sciara, MD  oxyCODONE-acetaminophen (PERCOCET) 5-325 MG tablet Take 2 tablets by mouth every 4 (four) hours as needed. 10/04/22   Hasset Chaviano, Barbara Cower, MD  pantoprazole (PROTONIX) 40 MG tablet Take  40 mg by mouth every morning. 08/31/20   [provider]  prazosin (MINIPRESS) 1 MG capsule Take 3 capsules (3 mg total) by mouth at bedtime. 08/27/22   Elsie Lincoln, MD  Semaglutide (RYBELSUS) 14 MG TABS Take 14 mg by mouth every morning.    [provider]  SUMAtriptan (IMITREX) 100 MG tablet Take 1 tablet at onset of migraine. May May repeat in 2 hours if headache persists or recurs. Do not take more than 3 a week 04/23/22   Van Clines, MD  SYMBICORT 160-4.5 MCG/ACT inhaler Inhale 2 puffs into the lungs. 08/13/22   [provider]  tiZANidine (ZANAFLEX) 4 MG tablet Take 4 mg by mouth every 8 (eight) hours as needed for muscle spasms.    [provider]  zonisamide (ZONEGRAN) 100 MG capsule Take 4 capsules every night 04/23/22   Van Clines, MD      Allergies    Bactrim [sulfamethoxazole-trimethoprim], Codeine, and Hydrocodone-acetaminophen    Review of Systems   Review of Systems  Physical Exam Updated Vital Signs BP 122/77   Pulse 80   Temp 97.9 F (36.6 C) (Oral)   Resp 17   Ht 5\' 11"  (1.803 m)   Wt 132.5 kg   LMP  (LMP Unknown)   SpO2 99%   BMI 40.74 kg/m  Physical Exam Vitals and nursing note reviewed.  Constitutional:      Appearance: She is well-developed.  HENT:     Head: Normocephalic and atraumatic.     Comments: Couple small lacerations inside the left lower lip likely from hitting her tooth on it when she fell.  Tooth is intact no avulsions and is stable.  Bite is normal.  Lacerations are relatively superficial and less than half a centimeter in length.    Mouth/Throat:     Mouth: Mucous membranes are moist.     Pharynx: Oropharynx is clear.  Eyes:     Pupils: Pupils are equal, round, and reactive to light.  Cardiovascular:     Rate and Rhythm: Normal rate and regular rhythm.  Pulmonary:     Effort: No respiratory distress.     Breath sounds: No stridor.  Abdominal:     General: Abdomen is flat. There is no  distension.  Musculoskeletal:        General: No swelling. Normal range of motion.     Cervical back: Normal range of motion.  Skin:    General: Skin is warm and dry.  Neurological:     General: No focal deficit present.     Mental Status: She is alert.     ED Results / Procedures / Treatments   Labs (all labs ordered are listed, but only abnormal results are displayed) Labs Reviewed  COMPREHENSIVE METABOLIC PANEL - Abnormal;  Notable for the following components:      Result Value   Sodium 134 (*)    Potassium 2.4 (*)    Chloride 92 (*)    Glucose, Bld 229 (*)    Creatinine, Ser 1.08 (*)    Anion gap 16 (*)    All other components within normal limits  POTASSIUM - Abnormal; Notable for the following components:   Potassium 3.1 (*)    All other components within normal limits  CBC WITH DIFFERENTIAL/PLATELET  MAGNESIUM  HCG, QUANTITATIVE, PREGNANCY    EKG None  Radiology DG Tibia/Fibula Left  Result Date: 10/04/2022 CLINICAL DATA:  Fall, left lower leg abrasion EXAM: LEFT TIBIA AND FIBULA - 2 VIEW COMPARISON:  None Available. FINDINGS: Irregularity of the medial distal femoral condyle, worrisome for nondisplaced fracture. Correlate for point tenderness. Status post ORIF of the medial malleolus. The joint spaces are preserved. Visualized soft tissues are within normal limits. IMPRESSION: Irregularity of the medial distal femoral condyle, worrisome for nondisplaced fracture. Correlate for point tenderness. Electronically Signed   By: Charline Bills M.D.   On: 10/04/2022 03:54   DG Ankle Complete Left  Result Date: 10/04/2022 CLINICAL DATA:  eval for injury EXAM: LEFT ANKLE COMPLETE - 3+ VIEW COMPARISON:  X-ray left ankle 10/27/2020 FINDINGS: Medial malleolar screw fixation. Acute, intra-articular, displaced, comminuted avulsion fracture arising from the superior posterior aspect of the navicular. No dislocation. Associated ankle joint effusion. There is no evidence of  arthropathy or other focal bone abnormality. Overlying subcutaneus soft tissue edema. IMPRESSION: 1. Acute, intra-articular, displaced, comminuted avulsion fracture arising from the superior posterior aspect of the navicular. 2. Associated ankle joint effusion. Electronically Signed   By: Tish Frederickson M.D.   On: 10/04/2022 02:00   DG Knee Complete 4 Views Left  Result Date: 10/04/2022 CLINICAL DATA:  eval for injury EXAM: LEFT KNEE - COMPLETE 4+ VIEW COMPARISON:  None Available. FINDINGS: Vague vertical lucency along the medial posterior humeral condyle with no definite acute fracture. Otherwise no evidence of fracture, dislocation, or joint effusion. No evidence of arthropathy or other focal bone abnormality. Mild subcutaneus soft tissue edema. IMPRESSION: 1. Vague vertical lucency along the medial humeral condyle with no definite acute fracture. Correlate with point tenderness to palpation. 2. Otherwise no acute displaced fracture or dislocation. Electronically Signed   By: Tish Frederickson M.D.   On: 10/04/2022 01:58   DG Knee Complete 4 Views Right  Result Date: 10/04/2022 CLINICAL DATA:  eval for injury.  Fall EXAM: RIGHT KNEE - COMPLETE 4+ VIEW COMPARISON:  None Available. FINDINGS: No evidence of fracture, dislocation, or joint effusion. No evidence of arthropathy or other focal bone abnormality. Mild subcutaneus soft tissue edema. IMPRESSION: No acute displaced fracture or dislocation. Electronically Signed   By: Tish Frederickson M.D.   On: 10/04/2022 01:56   CT Maxillofacial Wo Contrast  Result Date: 10/04/2022 CLINICAL DATA:  Fall EXAM: CT HEAD WITHOUT CONTRAST CT MAXILLOFACIAL WITHOUT CONTRAST TECHNIQUE: Multidetector CT imaging of the head and maxillofacial structures were performed using the standard protocol without intravenous contrast. Multiplanar CT image reconstructions of the maxillofacial structures were also generated. RADIATION DOSE REDUCTION: This exam was performed according to  the departmental dose-optimization program which includes automated exposure control, adjustment of the mA and/or kV according to patient size and/or use of iterative reconstruction technique. COMPARISON:  None Available. FINDINGS: CT HEAD FINDINGS Brain: There is no mass, hemorrhage or extra-axial collection. The size and configuration of the ventricles and extra-axial CSF spaces  are normal. The brain parenchyma is normal, without evidence of acute or chronic infarction. Vascular: No hyperdense vessel or unexpected vascular calcification. Skull: The visualized skull base, calvarium and extracranial soft tissues are normal. CT MAXILLOFACIAL FINDINGS Osseous: No fracture Orbits: The globes and optic nerves are intact. Normal extraocular muscles and intraorbital fat. Sinuses: No acute finding. Soft tissues: Normal visualized extracranial soft tissues. IMPRESSION: 1. No acute intracranial abnormality. 2. No facial fracture. Electronically Signed   By: Deatra Robinson M.D.   On: 10/04/2022 01:47   CT Head Wo Contrast  Result Date: 10/04/2022 CLINICAL DATA:  Fall EXAM: CT HEAD WITHOUT CONTRAST CT MAXILLOFACIAL WITHOUT CONTRAST TECHNIQUE: Multidetector CT imaging of the head and maxillofacial structures were performed using the standard protocol without intravenous contrast. Multiplanar CT image reconstructions of the maxillofacial structures were also generated. RADIATION DOSE REDUCTION: This exam was performed according to the departmental dose-optimization program which includes automated exposure control, adjustment of the mA and/or kV according to patient size and/or use of iterative reconstruction technique. COMPARISON:  None Available. FINDINGS: CT HEAD FINDINGS Brain: There is no mass, hemorrhage or extra-axial collection. The size and configuration of the ventricles and extra-axial CSF spaces are normal. The brain parenchyma is normal, without evidence of acute or chronic infarction. Vascular: No hyperdense  vessel or unexpected vascular calcification. Skull: The visualized skull base, calvarium and extracranial soft tissues are normal. CT MAXILLOFACIAL FINDINGS Osseous: No fracture Orbits: The globes and optic nerves are intact. Normal extraocular muscles and intraorbital fat. Sinuses: No acute finding. Soft tissues: Normal visualized extracranial soft tissues. IMPRESSION: 1. No acute intracranial abnormality. 2. No facial fracture. Electronically Signed   By: Deatra Robinson M.D.   On: 10/04/2022 01:47    Procedures Procedures    Medications Ordered in ED Medications  potassium chloride (KLOR-CON) packet 40 mEq (40 mEq Oral Given 10/04/22 0256)  potassium chloride 10 mEq in 100 mL IVPB (0 mEq Intravenous Stopped 10/04/22 0635)  fentaNYL (SUBLIMAZE) injection 100 mcg (100 mcg Intravenous Given 10/04/22 7829)    ED Course/ Medical Decision Making/ A&P                             Medical Decision Making Amount and/or Complexity of Data Reviewed Labs: ordered. Radiology: ordered. ECG/medicine tests: ordered.  Risk Prescription drug management.   No indication for laceration repair.  CT scans of head and face without any obvious injuries.  Wounds cleaned and cared for by nursing.  Was found to have a medial condyle fracture of her left femur and a navicular avulsion fracture.  Cam boot and knee immobilizer provided with crutches and she will follow-up with her orthopedic surgeon.  Pain medication provided as well.  Also found to be hypokalemic.  Repleted and repeated here and had improved.  Likely related to hydrochlorothiazide use.  Final Clinical Impression(s) / ED Diagnoses Final diagnoses:  Hypokalemia  Closed displaced fracture of navicular bone of left foot, initial encounter  Closed nondisplaced fracture of condyle of left femur, initial encounter    Rx / DC Orders ED Discharge Orders          Ordered    potassium chloride SA (KLOR-CON M) 20 MEQ tablet  2 times daily         10/04/22 0612    oxyCODONE-acetaminophen (PERCOCET) 5-325 MG tablet  Every 4 hours PRN,   Status:  Discontinued        10/04/22 5621  oxyCODONE-acetaminophen (PERCOCET) 5-325 MG tablet  Every 4 hours PRN        10/04/22 1914              Marily Memos, MD 10/04/22 2330

## 2022-10-07 ENCOUNTER — Ambulatory Visit (INDEPENDENT_AMBULATORY_CARE_PROVIDER_SITE_OTHER): Payer: No Typology Code available for payment source | Admitting: Clinical

## 2022-10-07 DIAGNOSIS — F4310 Post-traumatic stress disorder, unspecified: Secondary | ICD-10-CM | POA: Diagnosis not present

## 2022-10-07 DIAGNOSIS — F411 Generalized anxiety disorder: Secondary | ICD-10-CM

## 2022-10-07 DIAGNOSIS — F332 Major depressive disorder, recurrent severe without psychotic features: Secondary | ICD-10-CM | POA: Diagnosis not present

## 2022-10-07 DIAGNOSIS — F410 Panic disorder [episodic paroxysmal anxiety] without agoraphobia: Secondary | ICD-10-CM | POA: Diagnosis not present

## 2022-10-07 NOTE — Progress Notes (Signed)
Virtual Visit via Video Note   I connected with Andrea [name] on 10/07/22 at 10:00 AM EDT by a video enabled telemedicine application and verified that I am speaking with the correct person using two identifiers.   Location: Patient: Home Provider: Office   I discussed the limitations of evaluation and management by telemedicine and the availability of in person appointments. The patient expressed understanding and agreed to proceed.     THERAPIST PROGRESS NOTE   Session Time: 10:00 AM-10:30 AM   Participation Level: Active   Behavioral Response: CasualAlertDepressed   Type of Therapy: Individual Therapy   Treatment Goals addressed: Coping for Depression and  and PTSD   Interventions: CBT, Motivational Interviewing, Strength-based and Supportive   Summary: Andrea [name] is a 42 y.o. female who presents with Depression /Anxiety/ PTSD.The OPT therapist worked with the patient for her ongoing outpatient. OPT treatment. The OPT therapist utilized Motivational Interviewing to assist in creating therapeutic repore. The patient in the session was engaged and work in collaboration giving feedback about her triggers and symptoms over the past few weeks. The patient spoke about having a blackout due to low potassium and broke her ankle and knee. The patient spoke about her ongoing work to regulate her sleep cycle and is currently taking prescription pain medicine to manage pain and to help her get sleep. The OPT therapist utilized Cognitive Behavioral Therapy through cognitive restructuring as well as worked with the patient on self awareness and implementing coping strategies to assist in management of current stressors.The patient spoke about her work with Dr. Adrian Blackwater in her med therapy program. The OPT therapist overviewed upcoming health appointments with the patient as listed in the patients MyChart including upcoming follow up post ED visit with her PCP tomorrow.    Suicidal/Homicidal: Nowithout intent/plan   Therapist Response: The OPT therapist worked with the patient for the patients scheduled session. The patient was engaged in her session and gave feedback in relation to triggers, symptoms, and behavior responses over the past few weeks.The OPT therapist worked with the patient utilizing an in session Cognitive Behavioral Therapy exercise. The patient was responsive in the session and verbalized, " I was in the kitchen getting ready to eat dinner and I blacked out and fell and when I fell I broke my knee and ankle".   The patient is currently at home recovering from her fall injuries and the medical team identified low potassium as a potential for the blackout/passing out that led to her injuries.The OPT therapist worked with the patient on self check-ins throughout her week, regulating her sleep cycle, and having open communication with partner and using her support system as she begins her recovery.   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 Patient/Guardian was advised if they have not already done so to contact the registration department to sign all necessary forms in order for [place] to release information regarding their care.    Consent: Patient/Guardian gives verbal consent for treatment and assignment of benefits for  services provided during this visit. Patient/Guardian expressed understanding and agreed to proceed.    I discussed the assessment and treatment plan with the patient. The patient was provided an opportunity to ask questions and all were answered. The patient agreed with the plan and demonstrated an understanding of the  instructions.   The patient was advised to call back or seek an in-person evaluation if the symptoms worsen or if the condition fails to improve as anticipated.   I provided 30 minutes of non-face-to-face time during this encounter.     Winfred Burn, LCSW   10/07/2022

## 2022-10-08 ENCOUNTER — Ambulatory Visit (INDEPENDENT_AMBULATORY_CARE_PROVIDER_SITE_OTHER): Payer: No Typology Code available for payment source | Admitting: Orthopedic Surgery

## 2022-10-08 ENCOUNTER — Encounter: Payer: Self-pay | Admitting: Orthopedic Surgery

## 2022-10-08 VITALS — BP 143/93 | HR 89 | Ht 71.0 in

## 2022-10-08 DIAGNOSIS — S92255A Nondisplaced fracture of navicular [scaphoid] of left foot, initial encounter for closed fracture: Secondary | ICD-10-CM

## 2022-10-08 DIAGNOSIS — S83412A Sprain of medial collateral ligament of left knee, initial encounter: Secondary | ICD-10-CM

## 2022-10-08 MED ORDER — OXYCODONE-ACETAMINOPHEN 5-325 MG PO TABS: 1.0000 | ORAL_TABLET | Freq: Four times a day (QID) | ORAL | 0 refills | Status: DC | PRN

## 2022-10-08 MED ORDER — IBUPROFEN 600 MG PO TABS: 600.0000 mg | ORAL_TABLET | Freq: Three times a day (TID) | ORAL | 0 refills | Status: DC | PRN

## 2022-10-08 NOTE — Progress Notes (Signed)
New Patient Visit  Assessment: Andrea [name] is a 42 y.o. female with the following: 1. Sprain of medial collateral ligament of left knee, initial encounter 2. Closed nondisplaced fracture of navicular bone of left foot, initial encounter   Plan: Andrea [name] states she blacked out, fell and sustained injuries to her left knee, as well as her left foot.  She has a small avulsion type fracture on the dorsal aspect of the left foot.  She is to remain nonweightbearing, using a boot.  Evaluation of the left knee demonstrates bruising at the medial aspect of the MCL, with tenderness along the extent of the ligament.  There is some laxity at 30 degrees with valgus stress.  Presentation is most consistent with an MCL sprain.  No brace is needed at this time.  She should work on range of motion of the left knee.  She is to remain nonweightbearing.  In 2 weeks, we can discuss the possibility of a hinged brace.  Provided a prescription for a walker.  Refilled her pain medications, as well as ibuprofen.  Okay for her to return to work.  Follow-up: Return in about 2 weeks (around 10/22/2022).  Subjective:  Chief Complaint  Patient presents with   Leg Pain    L Leg injury after a fall due to syncope DOI 10/03/22    History of Present Illness: Andrea [name] is a 42 y.o. female who presents for evaluation of left leg pain.  About a week ago, she blacked out due to hypokalemia, and fell.  She had pain in the left knee and the left ankle.  She has been in the knee immobilizer, as well as a walking boot.  Radiographs of the foot demonstrated a foot fracture.  No acute injuries noted on the knee x-rays.  She continues take pain medications.  She notes a throbbing sensation in her left ankle.  She has remained nonweightbearing.   Review of Systems: No fevers or chills No numbness or tingling No chest pain No shortness of breath No bowel or bladder dysfunction No GI distress No  headaches   Medical History:  Past Medical History:  Diagnosis Date   Anxiety    Bipolar 1 disorder    Bipolar 1 disorder, mixed, severe 12/20/2016   Bipolar I disorder, most recent episode depressed 12/20/2016   Complication of anesthesia    hard time waking up    Depression    Diabetes mellitus without complication    Drug induced akathisia 04/02/2022   GERD (gastroesophageal reflux disease)    no meds   Kidney stones    Long term current use of antipsychotic medication 03/13/2022   Low back pain    Migraines    PCOS (polycystic ovarian syndrome)    PONV (postoperative nausea and vomiting)    Schizophrenia    Seizure 10/03/2021   Seizures 06/04/2016   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Marilynne Drivers 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   Shortness of breath dyspnea    with bronchitis    Past Surgical History:  Procedure Laterality Date   ABDOMINAL HYSTERECTOMY N/A 11/14/2015   Procedure: HYSTERECTOMY ABDOMINAL;  Surgeon: Levi Aland, MD;  Location: [facility];  Service: Gynecology;  Laterality: N/A;   CHOLECYSTECTOMY     INTRAUTERINE DEVICE (IUD) INSERTION  06/14/2014   [facility]   LYMPH NODES REMOVED     ovarian cyst removed     SALPINGOOPHORECTOMY Bilateral 11/14/2015   Procedure: SALPINGO OOPHORECTOMY;  Surgeon: Levi Aland, MD;  Location: [facility];  Service: Gynecology;  Laterality: Bilateral;    Family History  Problem Relation Age of Onset   Healthy Mother    Healthy Father    Social History   Tobacco Use   Smoking status: Never   Smokeless tobacco: Never  Vaping Use   Vaping Use: Never used  Substance Use Topics   Alcohol use: Never   Drug use: Never    Allergies  Allergen Reactions   Bactrim [Sulfamethoxazole-Trimethoprim] Hives and Itching   Codeine Hives and Itching    Current Meds  Medication Sig   ibuprofen (ADVIL) 600 MG tablet Take 1 tablet (600 mg total) by mouth every 8 (eight) hours as needed.   oxyCODONE-acetaminophen (PERCOCET/ROXICET) 5-325 MG  tablet Take 1 tablet by mouth every 6 (six) hours as needed for severe pain.    Objective: BP (!) 143/93   Pulse 89   Ht  (1.803 m)   LMP  (LMP Unknown)   BMI 40.74 kg/m   Physical Exam:  General: Alert and oriented. and No acute distress. Gait: Unable to ambulate.  Evaluation left knee demonstrates a superficial laceration that has scabbed over the anterior knee.  There is some associated bruising in the anterior knee.  She also has bruising over the VMO.  Tenderness to palpation along the course of the MCL.  There is some laxity to valgus stress at 30 degrees of flexion.  Negative Lachman.  Left foot is swollen and bruised.  Tenderness to palpation of the dorsum of the foot.  Toes are warm and well-perfused.  IMAGING: I personally reviewed images previously obtained from the ED   There is a left knee over negative for acute injury.  Possible medial femoral condyle fracture.  Avulsion type fracture from the superior aspect of the navicular.   New Medications:  Meds ordered this encounter  Medications   oxyCODONE-acetaminophen (PERCOCET/ROXICET) 5-325 MG tablet    Sig: Take 1 tablet by mouth every 6 (six) hours as needed for severe pain.    Dispense:  20 tablet    Refill:  0   ibuprofen (ADVIL) 600 MG tablet    Sig: Take 1 tablet (600 mg total) by mouth every 8 (eight) hours as needed.    Dispense:  60 tablet    Refill:  0      Oliver Barre, MD  10/08/2022 2:40 PM

## 2022-10-08 NOTE — Patient Instructions (Signed)
Okay to return to work.  Please provide a letter.

## 2022-10-09 ENCOUNTER — Encounter: Payer: Self-pay | Admitting: Orthopedic Surgery

## 2022-10-14 ENCOUNTER — Other Ambulatory Visit (HOSPITAL_COMMUNITY): Payer: Self-pay | Admitting: Psychiatry

## 2022-10-14 DIAGNOSIS — F4310 Post-traumatic stress disorder, unspecified: Secondary | ICD-10-CM

## 2022-10-14 DIAGNOSIS — G894 Chronic pain syndrome: Secondary | ICD-10-CM

## 2022-10-14 DIAGNOSIS — F332 Major depressive disorder, recurrent severe without psychotic features: Secondary | ICD-10-CM

## 2022-10-14 DIAGNOSIS — F411 Generalized anxiety disorder: Secondary | ICD-10-CM

## 2022-10-14 DIAGNOSIS — F410 Panic disorder [episodic paroxysmal anxiety] without agoraphobia: Secondary | ICD-10-CM

## 2022-10-15 ENCOUNTER — Telehealth (HOSPITAL_COMMUNITY): Payer: Self-pay | Admitting: Psychiatry

## 2022-10-15 ENCOUNTER — Other Ambulatory Visit: Payer: Self-pay | Admitting: Family Medicine

## 2022-10-15 DIAGNOSIS — Z1231 Encounter for screening mammogram for malignant neoplasm of breast: Secondary | ICD-10-CM

## 2022-10-15 NOTE — Telephone Encounter (Signed)
As previously documented patient has had relatively recent suicidal ideation of overdosing and despite this insurer will not pay for more medically appropriate amount of medication on hand.  To avoid delays in patient care and likely worsening of depression which would worsen suicidal ideation will provide a 90-day supply of Cymbalta as requested by the insurer.

## 2022-10-16 ENCOUNTER — Encounter: Payer: Self-pay | Admitting: Orthopedic Surgery

## 2022-10-16 ENCOUNTER — Telehealth: Payer: Self-pay | Admitting: Orthopedic Surgery

## 2022-10-16 MED ORDER — TRAMADOL HCL 50 MG PO TABS: 50.0000 mg | ORAL_TABLET | Freq: Two times a day (BID) | ORAL | 0 refills | Status: DC | PRN

## 2022-10-16 NOTE — Telephone Encounter (Signed)
[employer] forms received. To [employer]. 

## 2022-10-22 ENCOUNTER — Encounter: Payer: Self-pay | Admitting: Orthopedic Surgery

## 2022-10-22 ENCOUNTER — Ambulatory Visit (INDEPENDENT_AMBULATORY_CARE_PROVIDER_SITE_OTHER): Payer: No Typology Code available for payment source

## 2022-10-22 ENCOUNTER — Ambulatory Visit (INDEPENDENT_AMBULATORY_CARE_PROVIDER_SITE_OTHER): Payer: No Typology Code available for payment source | Admitting: Orthopedic Surgery

## 2022-10-22 DIAGNOSIS — S92255D Nondisplaced fracture of navicular [scaphoid] of left foot, subsequent encounter for fracture with routine healing: Secondary | ICD-10-CM

## 2022-10-22 DIAGNOSIS — S83412D Sprain of medial collateral ligament of left knee, subsequent encounter: Secondary | ICD-10-CM | POA: Diagnosis not present

## 2022-10-22 DIAGNOSIS — S72415A Nondisplaced unspecified condyle fracture of lower end of left femur, initial encounter for closed fracture: Secondary | ICD-10-CM

## 2022-10-22 MED ORDER — HYDROCODONE-ACETAMINOPHEN 5-325 MG PO TABS: 1.0000 | ORAL_TABLET | Freq: Four times a day (QID) | ORAL | 0 refills | Status: DC | PRN

## 2022-10-22 NOTE — Progress Notes (Signed)
Return patient Visit  Assessment: Andrea [name] is a 42 y.o. female with the following: 1.  Fracture medial femoral condyle, at insertion of MCL 2. Closed nondisplaced fracture of navicular bone of left foot, subsequent encounter   Plan: Andrea [name] continues to have pain in the left knee, as well as the left foot.  Radiographs of the left foot are stable for an avulsion fracture of the dorsal aspect of the navicular bone.  There is also a minimally displaced fracture at the insertion of the MCL of the left medial femoral condyle.  She continues to have some tenderness in this area.  The knee appears to be more stable to valgus stress today, with minimal laxity.  We will continue with nonoperative management.  She should continue to wear the boot.  Okay to bear weight, work on range of motion of the left ankle, as well as the left knee.  Continue to use the walker as needed.  She is requesting something stronger for pain, so I provided her with a prescription for hydrocodone.  I would like to see her back in 2 weeks.  Follow-up: Return in about 2 weeks (around 11/05/2022).  Subjective:  Chief Complaint  Patient presents with   Fracture    L knee and ankle DOI 10/03/22    History of Present Illness: Andrea [name] is a 42 y.o. female who returns for evaluation of left leg pain.  Approximately 3 weeks ago, she fell and sustained an injury to her left knee, as well as the left foot.  Her pain is improving.  She continues to use her walking boot, as well as a walker.  No additional injuries.  She has been taking tramadol recently, and this is not providing enough relief of her pain.  Otherwise, she is taking Motrin.  Review of Systems: No fevers or chills No numbness or tingling No chest pain No shortness of breath No bowel or bladder dysfunction No GI distress No headaches   Objective: LMP  (LMP Unknown)   Physical Exam:  General: Alert and oriented. and No acute  distress. Gait: Unable to ambulate.  Healed superficial abrasion over the anterior aspect of the left knee.  No swelling left knee.  Mild bruising in the proximal thigh.  She has some tenderness to palpation over the medial femoral condyle.  There is some pain with valgus stress.  No obvious gapping in full extension, or at 30 degrees of flexion.  She has good range of motion of the left knee.  She has good range of motion of the left ankle.  IMAGING: I personally reviewed images previously obtained from the ED   X-rays of the left knee were obtained in clinic today.  There is a nondisplaced fracture of the medial femoral condyle, at the insertion of the MCL.  This is more easily visualized on x-ray today.  There has been no further displacement.  No callus formation.  Minimal degenerative changes in the left knee.  Impression: Nondisplaced fracture of the left medial femoral condyle   X-rays of the left ankle were obtained in clinic today.  There is a single screw in the medial malleolus.  This remains in stable position.  Mortise is congruent.  No syndesmotic disruption.  On the lateral projection, there is a small avulsion fracture of the superior aspect of the navicular.  This has not changed in overall alignment.  There has not been obvious callus formation.  Impression: Stable avulsion fracture of  the superior aspect of the navicular   New Medications:  No orders of the defined types were placed in this encounter.     Oliver Barre, MD  10/22/2022 9:15 AM

## 2022-10-22 NOTE — Patient Instructions (Signed)
Instructions  1.  You have sustained an ankle sprain, or similar exercises that can be treated as an ankle sprain.  **These exercises can also be used as part of recovery from an ankle fracture.  2.  I encourage you to stay on your feet and gradually remove your walking boot.   3.  Below are some exercises that you can complete on your own to improve your symptoms.  4.  As an alternative, you can search for ankle sprain exercises online, and can see some demonstrations on YouTube  5.  If you are having difficulty with these exercises, we can also prescribe formal physical therapy  Ankle Exercises Ask your health care provider which exercises are safe for you. Do exercises exactly as told by your health care provider and adjust them as directed. It is normal to feel mild stretching, pulling, tightness, or mild discomfort as you do these exercises. Stop right away if you feel sudden pain or your pain gets worse. Do not begin these exercises until told by your health care provider.  Stretching and range-of-motion exercises These exercises warm up your muscles and joints and improve the movement and flexibility of your ankle. These exercises may also help to relieve pain.  Dorsiflexion/plantar flexion  Sit with your L knee straight or bent. Do not rest your foot on anything. Flex your left ankle to tilt the top of your foot toward your shin. This is called dorsiflexion. Hold this position for 5 seconds. Point your toes downward to tilt the top of your foot away from your shin. This is called plantar flexion. Hold this position for 5 seconds. Repeat 10 times. Complete this exercise 2-3 times a day.  As tolerated  Ankle alphabet  Sit with your L foot supported at your lower leg. Do not rest your foot on anything. Make sure your foot has room to move freely. Think of your L foot as a paintbrush: Move your foot to trace each letter of the alphabet in the air. Keep your hip and knee still while  you trace the letters. Trace every letter from A to Z. Make the letters as large as you can without causing or increasing any discomfort.  Repeat 2-3 times. Complete this exercise 2-3 times a day.   Strengthening exercises These exercises build strength and endurance in your ankle. Endurance is the ability to use your muscles for a long time, even after they get tired. Dorsiflexors These are muscles that lift your foot up. Secure a rubber exercise band or tube to an object, such as a table leg, that will stay still when the band is pulled. Secure the other end around your L foot. Sit on the floor, facing the object with your L leg extended. The band or tube should be slightly tense when your foot is relaxed. Slowly flex your L ankle and toes to bring your foot toward your shin. Hold this position for 5 seconds. Slowly return your foot to the starting position, controlling the band as you do that. Repeat 10 times. Complete this exercise 2-3 times a day.  Plantar flexors These are muscles that push your foot down. Sit on the floor with your L leg extended. Loop a rubber exercise band or tube around the ball of your L foot. The ball of your foot is on the walking surface, right under your toes. The band or tube should be slightly tense when your foot is relaxed. Slowly point your toes downward, pushing them away from   you. Hold this position for 5 seconds. Slowly release the tension in the band or tube, controlling smoothly until your foot is back in the starting position. Repeat 10 times. Complete this exercise 2-3 times a day.  Towel curls  Sit in a chair on a non-carpeted surface, and put your feet on the floor. Place a towel in front of your feet. Keeping your heel on the floor, put your L foot on the towel. Pull the towel toward you by grabbing the towel with your toes and curling them under. Keep your heel on the floor. Let your toes relax. Grab the towel again. Keep pulling the  towel until it is completely underneath your foot. Repeat 10 times. Complete this exercise 2-3 times a day.  Standing plantar flexion This is an exercise in which you use your toes to lift your body's weight while standing. Stand with your feet shoulder-width apart. Keep your weight spread evenly over the width of your feet while you rise up on your toes. Use a wall or table to steady yourself if needed, but try not to use it for support. If this exercise is too easy, try these options: Shift your weight toward your L leg until you feel challenged. If told by your health care provider, lift your uninjured leg off the floor. Hold this position for 5 seconds. Repeat 10 times. Complete this exercise 2-3 times a day.  Tandem walking Stand with one foot directly in front of the other. Slowly raise your back foot up, lifting your heel before your toes, and place it directly in front of your other foot. Continue to walk in this heel-to-toe way. Have a countertop or wall nearby to use if needed to keep your balance, but try not to hold onto anything for support.  Repeat 10 times. Complete this exercise 2-3 times a day.  

## 2022-10-24 ENCOUNTER — Other Ambulatory Visit (HOSPITAL_COMMUNITY): Payer: Self-pay | Admitting: Psychiatry

## 2022-10-24 DIAGNOSIS — F410 Panic disorder [episodic paroxysmal anxiety] without agoraphobia: Secondary | ICD-10-CM

## 2022-10-29 ENCOUNTER — Ambulatory Visit
Admission: RE | Admit: 2022-10-29 | Discharge: 2022-10-29 | Disposition: A | Discharge status: Home / Self Care | Payer: No Typology Code available for payment source | Source: Ambulatory Visit | Attending: Family Medicine | Admitting: Family Medicine

## 2022-10-29 DIAGNOSIS — Z1231 Encounter for screening mammogram for malignant neoplasm of breast: Secondary | ICD-10-CM

## 2022-10-30 ENCOUNTER — Other Ambulatory Visit: Payer: Self-pay | Admitting: Orthopedic Surgery

## 2022-10-30 MED ORDER — IBUPROFEN 600 MG PO TABS: 600.0000 mg | ORAL_TABLET | Freq: Three times a day (TID) | ORAL | 0 refills | Status: DC | PRN

## 2022-11-04 ENCOUNTER — Ambulatory Visit (HOSPITAL_COMMUNITY): Payer: No Typology Code available for payment source | Admitting: Clinical

## 2022-11-04 ENCOUNTER — Telehealth (HOSPITAL_COMMUNITY): Payer: Self-pay | Admitting: Clinical

## 2022-11-04 ENCOUNTER — Telehealth (HOSPITAL_COMMUNITY): Payer: No Typology Code available for payment source | Admitting: Psychiatry

## 2022-11-04 ENCOUNTER — Encounter (HOSPITAL_COMMUNITY): Payer: Self-pay

## 2022-11-04 NOTE — Telephone Encounter (Signed)
Patient did not respond to contact attempts 

## 2022-11-05 ENCOUNTER — Ambulatory Visit: Payer: No Typology Code available for payment source | Admitting: Orthopedic Surgery

## 2022-11-05 ENCOUNTER — Other Ambulatory Visit: Payer: Self-pay | Admitting: Orthopedic Surgery

## 2022-11-05 ENCOUNTER — Encounter (HOSPITAL_COMMUNITY): Payer: Self-pay | Admitting: Psychiatry

## 2022-11-05 ENCOUNTER — Telehealth (INDEPENDENT_AMBULATORY_CARE_PROVIDER_SITE_OTHER): Payer: No Typology Code available for payment source | Admitting: Psychiatry

## 2022-11-05 ENCOUNTER — Encounter: Payer: Self-pay | Admitting: Orthopedic Surgery

## 2022-11-05 ENCOUNTER — Other Ambulatory Visit (INDEPENDENT_AMBULATORY_CARE_PROVIDER_SITE_OTHER): Payer: No Typology Code available for payment source

## 2022-11-05 ENCOUNTER — Telehealth: Payer: Self-pay | Admitting: Orthopedic Surgery

## 2022-11-05 DIAGNOSIS — S72415D Nondisplaced unspecified condyle fracture of lower end of left femur, subsequent encounter for closed fracture with routine healing: Secondary | ICD-10-CM

## 2022-11-05 DIAGNOSIS — F4310 Post-traumatic stress disorder, unspecified: Secondary | ICD-10-CM

## 2022-11-05 DIAGNOSIS — F445 Conversion disorder with seizures or convulsions: Secondary | ICD-10-CM

## 2022-11-05 DIAGNOSIS — G4709 Other insomnia: Secondary | ICD-10-CM

## 2022-11-05 DIAGNOSIS — G43009 Migraine without aura, not intractable, without status migrainosus: Secondary | ICD-10-CM

## 2022-11-05 DIAGNOSIS — F411 Generalized anxiety disorder: Secondary | ICD-10-CM | POA: Diagnosis not present

## 2022-11-05 DIAGNOSIS — S92255D Nondisplaced fracture of navicular [scaphoid] of left foot, subsequent encounter for fracture with routine healing: Secondary | ICD-10-CM

## 2022-11-05 DIAGNOSIS — F410 Panic disorder [episodic paroxysmal anxiety] without agoraphobia: Secondary | ICD-10-CM

## 2022-11-05 DIAGNOSIS — G894 Chronic pain syndrome: Secondary | ICD-10-CM

## 2022-11-05 DIAGNOSIS — F332 Major depressive disorder, recurrent severe without psychotic features: Secondary | ICD-10-CM

## 2022-11-05 DIAGNOSIS — Z79899 Other long term (current) drug therapy: Secondary | ICD-10-CM

## 2022-11-05 DIAGNOSIS — F331 Major depressive disorder, recurrent, moderate: Secondary | ICD-10-CM

## 2022-11-05 MED ORDER — PRAZOSIN HCL 1 MG PO CAPS: 3.0000 mg | ORAL_CAPSULE | Freq: Every day | ORAL | 2 refills | Status: DC

## 2022-11-05 MED ORDER — HYDROXYZINE HCL 25 MG PO TABS
25.0000 mg | ORAL_TABLET | Freq: Three times a day (TID) | ORAL | 1 refills | Status: DC | PRN
Start: 2022-11-05 — End: 2022-11-25

## 2022-11-05 MED ORDER — MIRTAZAPINE 45 MG PO TABS
45.0000 mg | ORAL_TABLET | Freq: Every day | ORAL | 0 refills | Status: DC
Start: 2022-11-05 — End: 2023-01-06

## 2022-11-05 MED ORDER — HYDROCODONE-ACETAMINOPHEN 5-325 MG PO TABS: 1.0000 | ORAL_TABLET | Freq: Four times a day (QID) | ORAL | 0 refills | Status: DC | PRN

## 2022-11-05 MED ORDER — GABAPENTIN 300 MG PO CAPS: ORAL_CAPSULE | ORAL | 1 refills | Status: DC

## 2022-11-05 MED ORDER — DULOXETINE HCL 60 MG PO CPEP: 120.0000 mg | ORAL_CAPSULE | Freq: Every day | ORAL | 0 refills | Status: DC

## 2022-11-05 NOTE — Patient Instructions (Signed)
We did not make any medication changes today.  Keep up the good work in therapy and if you can over the coming months we will try to see about reducing your overall medication burden and we can start with decreasing the as needed gabapentin for sleep.

## 2022-11-05 NOTE — Progress Notes (Signed)
Return patient Visit  Assessment: Andrea [name] is a 42 y.o. female with the following: 1.  Fracture medial femoral condyle, at insertion of MCL 2. Closed nondisplaced fracture of navicular bone of left foot, subsequent encounter   Plan: Andrea [name] has some residual pain in the left knee.  However, her pain and function continues to improve.  It is okay for her to start transitioning out of the walking boot.  She has excellent range of motion of her left knee.  Continue to walk without assistive device.  Medications as needed.  Use ice if it sore.  I provided her with exercises for her left ankle.  I will see her back in 1 month.  Follow-up: Return in about 4 weeks (around 12/03/2022).  Subjective:  Chief Complaint  Patient presents with   Fracture    L knee and L foot DOI 10/03/22    History of Present Illness: Andrea [name] is a 42 y.o. female who returns for evaluation of left leg pain.  Approximately 4-5 weeks ago, she fell and sustained an injury to her left knee, as well as the left foot.  Her pain and swelling continues to improve.  She feels ready to transition out of the walking boot.  She is not using an assistive device.  She still has pain in her knee.  However, she notes good range of motion. Review of Systems: No fevers or chills No numbness or tingling No chest pain No shortness of breath No bowel or bladder dysfunction No GI distress No headaches   Objective: LMP  (LMP Unknown)   Physical Exam:  General: Alert and oriented. and No acute distress. Gait: Left-sided antalgic gait, with a cam walker on her left ankle.  Healed superficial abrasion over the anterior aspect of the left knee.  Left knee without swelling.  She is able to achieve full extension.  She tolerates flexion beyond 110 degrees.  Minimal laxity to valgus stress at 0 and 30 degrees of flexion.  Negative Lachman.  Left foot is without swelling.  No bruising.  No tenderness to  palpation over the dorsal aspect of the foot.  Toes warm and well-perfused.   IMAGING: I personally reviewed images previously obtained from the ED   X-rays of the left knee were obtained in clinic today.  Nondisplaced fracture of the medial femoral condyle, at the insertion of the MCL remains in stable position.  There has been no interval displacement.  There is a mild amount of comminution.  Joint otherwise with good joint space.  Minimal osteophytes.  No additional injuries noted.  Impression: Stable left medial femoral condyle avulsion fracture   X-rays of the left foot were obtained in clinic today.  No acute injuries are noted.  Small avulsion fracture of the dorsal aspect of the navicular remains in stable position.  Minimal displacement.  No change in overall alignment.  No dislocation.  Impression: Stable left navicular avulsion fracture   New Medications:  No orders of the defined types were placed in this encounter.     Oliver Barre, MD  11/05/2022 10:46 AM

## 2022-11-05 NOTE — Telephone Encounter (Signed)
[employer] forms received. To [employer]. 
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
## 2022-11-05 NOTE — Patient Instructions (Signed)
Instructions  1.  You have sustained an ankle sprain, or similar exercises that can be treated as an ankle sprain.  **These exercises can also be used as part of recovery from an ankle fracture.  2.  I encourage you to stay on your feet and gradually remove your walking boot.   3.  Below are some exercises that you can complete on your own to improve your symptoms.  4.  As an alternative, you can search for ankle sprain exercises online, and can see some demonstrations on YouTube  5.  If you are having difficulty with these exercises, we can also prescribe formal physical therapy  Ankle Exercises Ask your health care provider which exercises are safe for you. Do exercises exactly as told by your health care provider and adjust them as directed. It is normal to feel mild stretching, pulling, tightness, or mild discomfort as you do these exercises. Stop right away if you feel sudden pain or your pain gets worse. Do not begin these exercises until told by your health care provider.  Stretching and range-of-motion exercises These exercises warm up your muscles and joints and improve the movement and flexibility of your ankle. These exercises may also help to relieve pain.  Dorsiflexion/plantar flexion  Sit with your L knee straight or bent. Do not rest your foot on anything. Flex your left ankle to tilt the top of your foot toward your shin. This is called dorsiflexion. Hold this position for 5 seconds. Point your toes downward to tilt the top of your foot away from your shin. This is called plantar flexion. Hold this position for 5 seconds. Repeat 10 times. Complete this exercise 2-3 times a day.  As tolerated  Ankle alphabet  Sit with your L foot supported at your lower leg. Do not rest your foot on anything. Make sure your foot has room to move freely. Think of your L foot as a paintbrush: Move your foot to trace each letter of the alphabet in the air. Keep your hip and knee still while  you trace the letters. Trace every letter from A to Z. Make the letters as large as you can without causing or increasing any discomfort.  Repeat 2-3 times. Complete this exercise 2-3 times a day.   Strengthening exercises These exercises build strength and endurance in your ankle. Endurance is the ability to use your muscles for a long time, even after they get tired. Dorsiflexors These are muscles that lift your foot up. Secure a rubber exercise band or tube to an object, such as a table leg, that will stay still when the band is pulled. Secure the other end around your L foot. Sit on the floor, facing the object with your L leg extended. The band or tube should be slightly tense when your foot is relaxed. Slowly flex your L ankle and toes to bring your foot toward your shin. Hold this position for 5 seconds. Slowly return your foot to the starting position, controlling the band as you do that. Repeat 10 times. Complete this exercise 2-3 times a day.  Plantar flexors These are muscles that push your foot down. Sit on the floor with your L leg extended. Loop a rubber exercise band or tube around the ball of your L foot. The ball of your foot is on the walking surface, right under your toes. The band or tube should be slightly tense when your foot is relaxed. Slowly point your toes downward, pushing them away from   you. Hold this position for 5 seconds. Slowly release the tension in the band or tube, controlling smoothly until your foot is back in the starting position. Repeat 10 times. Complete this exercise 2-3 times a day.  Towel curls  Sit in a chair on a non-carpeted surface, and put your feet on the floor. Place a towel in front of your feet. Keeping your heel on the floor, put your L foot on the towel. Pull the towel toward you by grabbing the towel with your toes and curling them under. Keep your heel on the floor. Let your toes relax. Grab the towel again. Keep pulling the  towel until it is completely underneath your foot. Repeat 10 times. Complete this exercise 2-3 times a day.  Standing plantar flexion This is an exercise in which you use your toes to lift your body's weight while standing. Stand with your feet shoulder-width apart. Keep your weight spread evenly over the width of your feet while you rise up on your toes. Use a wall or table to steady yourself if needed, but try not to use it for support. If this exercise is too easy, try these options: Shift your weight toward your L leg until you feel challenged. If told by your health care provider, lift your uninjured leg off the floor. Hold this position for 5 seconds. Repeat 10 times. Complete this exercise 2-3 times a day.  Tandem walking Stand with one foot directly in front of the other. Slowly raise your back foot up, lifting your heel before your toes, and place it directly in front of your other foot. Continue to walk in this heel-to-toe way. Have a countertop or wall nearby to use if needed to keep your balance, but try not to hold onto anything for support.  Repeat 10 times. Complete this exercise 2-3 times a day.  

## 2022-11-08 ENCOUNTER — Other Ambulatory Visit: Payer: Self-pay | Admitting: Neurology

## 2022-11-12 ENCOUNTER — Ambulatory Visit: Payer: No Typology Code available for payment source | Admitting: Neurology

## 2022-11-18 ENCOUNTER — Encounter: Payer: Self-pay | Admitting: Orthopedic Surgery

## 2022-11-24 ENCOUNTER — Other Ambulatory Visit (HOSPITAL_COMMUNITY): Payer: Self-pay | Admitting: Psychiatry

## 2022-11-24 DIAGNOSIS — F411 Generalized anxiety disorder: Secondary | ICD-10-CM

## 2022-11-29 ENCOUNTER — Encounter: Payer: No Typology Code available for payment source | Admitting: Orthopedic Surgery

## 2022-12-03 ENCOUNTER — Encounter: Payer: No Typology Code available for payment source | Admitting: Orthopedic Surgery

## 2022-12-06 ENCOUNTER — Encounter: Payer: Self-pay | Admitting: Neurology

## 2022-12-06 ENCOUNTER — Ambulatory Visit (INDEPENDENT_AMBULATORY_CARE_PROVIDER_SITE_OTHER): Payer: No Typology Code available for payment source | Admitting: Neurology

## 2022-12-06 VITALS — BP 148/94 | HR 80 | Ht 71.0 in | Wt 287.8 lb

## 2022-12-06 DIAGNOSIS — G43009 Migraine without aura, not intractable, without status migrainosus: Secondary | ICD-10-CM

## 2022-12-06 DIAGNOSIS — F445 Conversion disorder with seizures or convulsions: Secondary | ICD-10-CM | POA: Diagnosis not present

## 2022-12-06 MED ORDER — EMGALITY 120 MG/ML ~~LOC~~ SOAJ: 1.0000 | SUBCUTANEOUS | 11 refills | Status: DC

## 2022-12-06 MED ORDER — ZONISAMIDE 100 MG PO CAPS: ORAL_CAPSULE | ORAL | 11 refills | Status: DC

## 2022-12-06 MED ORDER — SUMATRIPTAN SUCCINATE 100 MG PO TABS: ORAL_TABLET | ORAL | 11 refills | Status: DC

## 2022-12-06 NOTE — Progress Notes (Signed)
NEUROLOGY FOLLOW UP OFFICE NOTE  Andrea [name] [id number] 1981/03/20  HISTORY OF PRESENT ILLNESS: I had the pleasure of seeing Andrea [name] in follow-up in the neurology clinic on 12/06/2022.  The patient was last seen 7 months ago for psychogenic non-epileptic events and migraines. She is alone in the office today. Records and images were personally reviewed where available. On her last visit, she reported bilateral hand weakness. She had an EMG/NCV of both upper extremities which was normal. For the migraines, she was started on Aimovig which helped, however insurance would no longer cover it so she was switched to [place] in January. She is also on Zonisamide 400mg  at bedtime and Gabapentin 300-300-900mg  for migraine prophylaxis. The Gabapentin is also used for anxiety, sometimes she takes an additional 2 if needed for insomnia but has not done this often lately. She is happy to report that she has not had any seizures since her last visit 7 months ago. She had a syncopal episode on 10/04/22 in the setting of hypokalemia, she reports all of a sudden everything went black and she fell. She has been doing well since medications were switched. The Emgality has been helpful, she was down to 3-4 a month, however the last couple of weeks she has had really bad migraines 3-4 times a week. Work has been more demanding, they have been working longer hours for the summer. She gets at least 6 hours of sleep. Mood is good.    History on Initial Assessment 08/08/2021: This is a pleasant 42 year old right-handed woman with a history of hypertension, DM, migraines, bipolar disorder, presenting for evaluation of new onset seizures. She is accompanied by her husband who helps supplement the history today. She was in her usual state of health until 07/31/21 while at work at [employer], she started feeling a little funny and sat down, then woke up in the hospital. Co-workers reported she got went, went "pale white," then  slid down to the floor and had a 4-minute episode of jerking with EMS noting post-ictal confusion. No tongue bite or incontinence. In the ER, her husband reports she had 2 more seizures, but ER notes indicate there was an episode of upper body jerking including head jerking lasting 15-20 seconds. Bloodwork showed creatinine 1.21, glucose 265. I personally reviewed MRI brain with and without contrast which was normal, hippocampi symmetric with no abnormal signal or enhancement seen. Her wake and drowsy EEG was normal. She had been on Gabapentin for anxiety, dose was increased to 300mg  TID. She feels her left side has been weaker since then, this has gotten better. Since hospital discharge, her husband reports more seizures. She had an unwitnessed one on 2/16 where she recalls going to the bathroom and coming out, then waking up on the ground, no injuries/tongue bite/incontinence. There was no prior warning. On 2/17, after eating a snack, she told her husband she felt funny and lay down, she then had another shaking episode lasting less than a minute. The last episode was on 2/18, they were dozing in bed, he felt her shaking and saw her lying on her left side. He thinks her eyes were closed, mouth open, it lasted less than 30 seconds, she woke up, then had another 30-second shaking episode. She had no memory of it and told her husband her head hurt and she felt very tired. Her husband denies any staring episodes. She denies any olfactory/gustatory hallucinations, deja vu, rising epigastric sensation, focal numbness/tingling. She has occasional jerks  in her legs. She used to have migraines that quieted down. She used to take Topamax but it interacted with one of her medications. Since the initial seizure, she has had frontal throbbing headaches that recur, different from past headaches. She is sensitive to lights/sounds, no nausea/vomiting. Tylenol helps sometimes but lately has not, she has been taking 2 650mg  tabs  three times a day for the past week. She denies any diplopia, dysarthria/dysphagia, neck/back pain, bowel/bladder dysfunction. Her husband tried to check her glucose level with the last seizure, it was 271. She denies any sleep deprivation, she gets 6-8 hours of sleep with Doxepin. No recent change in stress levels. She is on Zyprexa for mood, mood is good. She is not having a lot of anxiety anymore. She lives with her husband and works as a Insurance underwriter at [employer]. No alcohol use. She had a normal birth and early development.  There is no history of febrile convulsions, CNS infections such as meningitis/encephalitis, significant traumatic brain injury, neurosurgical procedures, or family history of seizures.  Update 01/16/2022: Since her last visit, she was admitted for EMU monitoring from June 12-16, 2023 where Gabapentin and Zonisamide were held. Baseline EEG was normal, typical events were not captured. Diagnosis of migraine with aura versus nonepileptic events were discussed with them. She did well event-free for 3 months until 7/29 when she had 2 seizures followed by chest pressure, so they went to the ER. BP was 95/56, EKG showed sinus rhythm, troponin negative. Since then, she has had 2 on 7/31, and another 1.5 hours ago. She feels really tired. No tongue bite or incontinence. Her husband shows 2 videos of her typical events. During one, she is lying on the bed with eyes closed, head bobbing up and down with irregular upper body movements, heavy breathing, unresponsive to husband afterwards. Another video shows her with her head bent down, her husband notes she started staring, then head slumped down and she started having side to side head movements as he lay her on the bed with upper body rocking, eyes closed.   Prior ASMs: Topiramate  PAST MEDICAL HISTORY: Past Medical History:  Diagnosis Date   Anxiety    Bipolar 1 disorder (HCC)    Bipolar 1 disorder, mixed, severe (HCC) 12/20/2016    Bipolar I disorder, most recent episode depressed (HCC) 12/20/2016   Complication of anesthesia    hard time waking up    Depression    Diabetes mellitus without complication (HCC)    Drug induced akathisia 04/02/2022   GERD (gastroesophageal reflux disease)    no meds   Kidney stones    Long term current use of antipsychotic medication 03/13/2022   Low back pain    Migraines    PCOS (polycystic ovarian syndrome)    PONV (postoperative nausea and vomiting)    Schizophrenia (HCC)    Seizure (HCC) 10/03/2021   Seizures (HCC) 06/04/2016   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[facility] 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 18 g 0   DULoxetine (CYMBALTA) 60 MG capsule Take 2 capsules (120 mg total) by mouth daily. 180 capsule 0   empagliflozin (JARDIANCE) 25 MG TABS tablet Take 25 mg by  mouth every morning.     gabapentin (NEURONTIN) 300 MG capsule Take 1 pill each morning, one at noon, and 3 at night. As needed: 2 pills nightly for insomnia. 200 capsule 1   Galcanezumab-gnlm (EMGALITY) 120 MG/ML SOAJ Inject 1 Pen into the skin every 30 (thirty) days. 1.12 mL 11   hydrOXYzine (ATARAX) 25 MG tablet TAKE 1 TABLET BY MOUTH 3 TIMES DAILY AS NEEDED FOR ANXIETY. 90 tablet 1   ibuprofen (ADVIL) 600 MG tablet Take 1 tablet (600 mg total) by mouth every 8 (eight) hours as needed. 60 tablet 0   insulin degludec (TRESIBA FLEXTOUCH) 100 UNIT/ML FlexTouch Pen Inject 20 Units into the skin 2 (two) times daily with a meal.     levocetirizine (XYZAL) 5 MG tablet Take 5 mg by mouth at bedtime.     mirtazapine (REMERON) 45 MG tablet Take 1 tablet (45 mg total) by mouth at bedtime. 90 tablet 0   ondansetron (ZOFRAN-ODT) 4 MG  disintegrating tablet Take 1 tablet (4 mg total) by mouth every 8 (eight) hours as needed for nausea or vomiting. 20 tablet 11   oxybutynin (DITROPAN-XL) 5 MG 24 hr tablet Take one tablet twice daily (Patient taking differently: Take 5 mg by mouth 2 (two) times daily.) 60 tablet 2   pantoprazole (PROTONIX) 40 MG tablet Take 40 mg by mouth every morning.     potassium chloride (MICRO-K) 10 MEQ CR capsule Take 10 mEq by mouth daily.     prazosin (MINIPRESS) 1 MG capsule Take 3 capsules (3 mg total) by mouth at bedtime. 90 capsule 2   Semaglutide (RYBELSUS) 14 MG TABS Take 14 mg by mouth every morning.     SINGULAIR 10 MG tablet Take 10 mg by mouth daily.     spironolactone (ALDACTONE) 25 MG tablet Take 25 mg by mouth daily.     SUMAtriptan (IMITREX) 100 MG tablet Take 1 tablet at onset of migraine. May May repeat in 2 hours if headache persists or recurs. Do not take more than 3 a week 10 tablet 11   SYMBICORT 160-4.5 MCG/ACT inhaler Inhale 2 puffs into the lungs.     tiZANidine (ZANAFLEX) 4 MG tablet Take 4 mg by mouth every 8 (eight) hours as needed for muscle spasms.     zonisamide (ZONEGRAN) 100 MG capsule TAKE 4 CAPSULES EVERY NIGHT 120 capsule 0   No current facility-administered medications on file prior to visit.    ALLERGIES: Allergies  Allergen Reactions   Bactrim [Sulfamethoxazole-Trimethoprim] Hives and Itching   Codeine Hives and Itching    FAMILY HISTORY: Family History  Problem Relation Age of Onset   Healthy Mother    Healthy Father     SOCIAL HISTORY: Social History   Socioeconomic History   Marital status: Divorced    Spouse name: Not on file   Number of children: 0   Years of education: 12   Highest education level: Not on file  Occupational History   Not on file  Tobacco Use   Smoking status: Never   Smokeless tobacco: Never  Vaping Use   Vaping Use: Never used  Substance and Sexual Activity   Alcohol use: Never   Drug use: Never   Sexual activity:  Never  Other Topics Concern   Not on file  Social History Narrative   Right handed   Drinks caffeine   One story home   Social Determinants of Health   Financial Resource Strain: Not on file  Food Insecurity: Not  on file  Transportation Needs: Not on file  Physical Activity: Not on file  Stress: Not on file  Social Connections: Not on file  Intimate Partner Violence: Not on file     PHYSICAL EXAM: Vitals:   12/06/22 1415  BP: (!) 148/94  Pulse: 80  SpO2: 98%   General: No acute distress Head:  Normocephalic/atraumatic Skin/Extremities: No rash, no edema Neurological Exam: alert and awake. No aphasia or dysarthria. Fund of knowledge is appropriate.  Attention and concentration are normal.   Cranial nerves: Pupils equal, round. Extraocular movements intact with no nystagmus. Visual fields full.  No facial asymmetry.  Motor: Bulk and tone normal, muscle strength 5/5 throughout with no pronator drift.   Finger to nose testing intact.  Gait narrow-based and steady, able to tandem walk adequately.  Romberg negative.   IMPRESSION: This is a pleasant 42yo RH woman with a history of hypertension, DM, migraines, bipolar disorder, with psychogenic non-epileptic events (PNES). Her 48-hour EEG and 5-day EMU admission showed normal baseline EEG, however typical events were not captured. 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 She has been working with [employer] and denies any spells in the past 7 months. Mood is good. Migraines had a good response to Emgality until recently in the setting of increased stress at work. Increase Zonisamide to 500mg  at bedtime for migraine prophylaxis. Continue Emgality and Gabapentin, she has prn sumatriptan for rescue. Continue follow-up with Behavioral Health. She is aware of [place] driving laws to stop driving after an episode of loss of awareness until 6 months event-free. Follow-up in 6 months, call for any changes.   Thank you for allowing me  to participate in her care.  Please do not hesitate to call for any questions or concerns.   Patrcia Dolly, M.D.   CC: Andrea Res, FNP

## 2022-12-06 NOTE — Patient Instructions (Signed)
Good to see you.  Increase Zonisamide 100mg : take 5 capsules every night  2. Continue Emgality and Gabapentin. Refills also sent for Sumatriptan  3. Continue follow-up with Behavioral Health  4. Follow-up in 6 months, call for any changes

## 2022-12-11 ENCOUNTER — Other Ambulatory Visit (HOSPITAL_COMMUNITY): Payer: Self-pay | Admitting: Psychiatry

## 2022-12-11 DIAGNOSIS — G4709 Other insomnia: Secondary | ICD-10-CM

## 2022-12-11 DIAGNOSIS — F4310 Post-traumatic stress disorder, unspecified: Secondary | ICD-10-CM

## 2022-12-19 ENCOUNTER — Telehealth: Payer: BC Managed Care – PPO | Admitting: Nurse Practitioner

## 2022-12-19 ENCOUNTER — Encounter: Payer: Self-pay | Admitting: Nurse Practitioner

## 2022-12-19 DIAGNOSIS — J020 Streptococcal pharyngitis: Secondary | ICD-10-CM | POA: Diagnosis not present

## 2022-12-19 MED ORDER — AMOXICILLIN 500 MG PO CAPS
500.0000 mg | ORAL_CAPSULE | Freq: Two times a day (BID) | ORAL | 0 refills | Status: AC
Start: 2022-12-19 — End: 2022-12-29

## 2022-12-19 NOTE — Progress Notes (Signed)
Virtual Visit Consent   Andrea [name], you are scheduled for a virtual visit with a [facility] provider today. Just as with appointments in the office, your consent must be obtained to participate. Your consent will be active for this visit and any virtual visit you may have with one of our providers in the next 365 days. If you have a MyChart account, a copy of this consent can be sent to you electronically.  As this is a virtual visit, video technology does not allow for your provider to perform a traditional examination. This may limit your provider's ability to fully assess your condition. If your provider identifies any concerns that need to be evaluated in person or the need to arrange testing (such as labs, EKG, etc.), we will make arrangements to do so. Although advances in technology are sophisticated, we cannot ensure that it will always work on either your end or our end. If the connection with a video visit is poor, the visit may have to be switched to a telephone visit. With either a video or telephone visit, we are not always able to ensure that we have a secure connection.  By engaging in this virtual visit, you consent to the provision of healthcare and authorize for your insurance to be billed (if applicable) for the services provided during this visit. Depending on your insurance coverage, you may receive a charge related to this service.  I need to obtain your verbal consent now. Are you willing to proceed with your visit today? Andrea [name] has provided verbal consent on 12/19/2022 for a virtual visit (video or telephone). [name], FNP  Date: 12/19/2022 9:31 AM  Virtual Visit via Video Note   I, [name], connected with  Andrea [name]  ([id number], June 29, 1980) on 12/19/22 at  9:30 AM EDT by a video-enabled telemedicine application and verified that I am speaking with the correct person using two identifiers.  Location: Patient: Virtual Visit Location  Patient: Home Provider: Virtual Visit Location Provider: Home Office   I discussed the limitations of evaluation and management by telemedicine and the availability of in person appointments. The patient expressed understanding and agreed to proceed.    History of Present Illness: Andrea [name] is a 42 y.o. who identifies as a female who was assigned female at birth, and is being seen today for sore throat, ear pain and cervical lymph node swelling  She has had a fever today as well  She has more pain with swallowing   Symptom onset was 3 days ago with worsening symptoms in the past day  Her nephew did have strep one week ago   She denies nasal congestion or sinus pressure   She has not taken a COVID test    Problems:  Patient Active Problem List   Diagnosis Date Noted   Chronic pain 07/16/2022   PTSD (post-traumatic stress disorder) 03/13/2022   Generalized anxiety disorder with panic attacks 03/13/2022   Moderate episode of recurrent major depressive disorder (HCC) 03/13/2022   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 03/13/2022   Other insomnia 03/13/2022   Polypharmacy 03/13/2022   Convulsion (HCC) 11/26/2021   Irregular periods 09/26/2020   Mild persistent asthma with acute exacerbation 06/18/2017   Uncontrolled type 2 diabetes mellitus with hyperglycemia, with long-term current use of insulin (HCC) 09/18/2016   HTN (hypertension) 09/18/2016   Pelvic pain in female 11/14/2015   Acute pyelonephritis 01/16/2013   Migraines 10/30/2012   Obesity, Class  III, BMI 40-49.9 (morbid obesity) (HCC) 10/30/2012   Kidney stones     Allergies:  Allergies  Allergen Reactions   Bactrim [Sulfamethoxazole-Trimethoprim] Hives and Itching   Codeine Hives and Itching   Medications:  Current Outpatient Medications:    albuterol (VENTOLIN HFA) 108 (90 Base) MCG/ACT inhaler, Inhale 1-2 puffs into the lungs every 6 (six) hours as needed for wheezing or  shortness of breath., Disp: 18 g, Rfl: 0   DULoxetine (CYMBALTA) 60 MG capsule, Take 2 capsules (120 mg total) by mouth daily., Disp: 180 capsule, Rfl: 0   empagliflozin (JARDIANCE) 25 MG TABS tablet, Take 25 mg by mouth every morning., Disp: , Rfl:    gabapentin (NEURONTIN) 300 MG capsule, Take 1 pill each morning, one at noon, and 3 at night. As needed: 2 pills nightly for insomnia., Disp: 200 capsule, Rfl: 1   Galcanezumab-gnlm (EMGALITY) 120 MG/ML SOAJ, Inject 1 Pen into the skin every 30 (thirty) days., Disp: 1.12 mL, Rfl: 11   hydrOXYzine (ATARAX) 25 MG tablet, TAKE 1 TABLET BY MOUTH 3 TIMES DAILY AS NEEDED FOR ANXIETY., Disp: 90 tablet, Rfl: 1   ibuprofen (ADVIL) 600 MG tablet, Take 1 tablet (600 mg total) by mouth every 8 (eight) hours as needed., Disp: 60 tablet, Rfl: 0   insulin degludec (TRESIBA FLEXTOUCH) 100 UNIT/ML FlexTouch Pen, Inject 20 Units into the skin 2 (two) times daily with a meal., Disp: , Rfl:    levocetirizine (XYZAL) 5 MG tablet, Take 5 mg by mouth at bedtime., Disp: , Rfl:    mirtazapine (REMERON) 45 MG tablet, Take 1 tablet (45 mg total) by mouth at bedtime., Disp: 90 tablet, Rfl: 0   ondansetron (ZOFRAN-ODT) 4 MG disintegrating tablet, Take 1 tablet (4 mg total) by mouth every 8 (eight) hours as needed for nausea or vomiting., Disp: 20 tablet, Rfl: 11   oxybutynin (DITROPAN-XL) 5 MG 24 hr tablet, Take one tablet twice daily (Patient taking differently: Take 5 mg by mouth 2 (two) times daily.), Disp: 60 tablet, Rfl: 2   pantoprazole (PROTONIX) 40 MG tablet, Take 40 mg by mouth every morning., Disp: , Rfl:    potassium chloride (MICRO-K) 10 MEQ CR capsule, Take 10 mEq by mouth daily., Disp: , Rfl:    prazosin (MINIPRESS) 1 MG capsule, Take 3 capsules (3 mg total) by mouth at bedtime., Disp: 90 capsule, Rfl: 2   Semaglutide (RYBELSUS) 14 MG TABS, Take 14 mg by mouth every morning., Disp: , Rfl:    SINGULAIR 10 MG tablet, Take 10 mg by mouth daily., Disp: , Rfl:     spironolactone (ALDACTONE) 25 MG tablet, Take 25 mg by mouth daily., Disp: , Rfl:    SUMAtriptan (IMITREX) 100 MG tablet, Take 1 tablet at onset of migraine. May May repeat in 2 hours if headache persists or recurs. Do not take more than 3 a week, Disp: 10 tablet, Rfl: 11   SYMBICORT 160-4.5 MCG/ACT inhaler, Inhale 2 puffs into the lungs., Disp: , Rfl:    tiZANidine (ZANAFLEX) 4 MG tablet, Take 4 mg by mouth every 8 (eight) hours as needed for muscle spasms., Disp: , Rfl:    zonisamide (ZONEGRAN) 100 MG capsule, Take 5 capsules every night, Disp: 150 capsule, Rfl: 11  Observations/Objective: Patient is well-developed, well-nourished in no acute distress.  Resting comfortably  at home.  Head is normocephalic, atraumatic.  No labored breathing.  Speech is clear and coherent with logical content.  Patient is alert and oriented at baseline.  Assessment and Plan: 1. Strep throat  Use ibuprofen or tylenol for pain relief   - amoxicillin (AMOXIL) 500 MG capsule; Take 1 capsule (500 mg total) by mouth 2 (two) times daily for 10 days.  Dispense: 20 capsule; Refill: 0     Follow Up Instructions: I discussed the assessment and treatment plan with the patient. The patient was provided an opportunity to ask questions and all were answered. The patient agreed with the plan and demonstrated an understanding of the instructions.  A copy of instructions were sent to the patient via MyChart unless otherwise noted below.    The patient was advised to call back or seek an in-person evaluation if the symptoms worsen or if the condition fails to improve as anticipated.  Time:  I spent 15 minutes with the patient via telehealth technology discussing the above problems/concerns.    [name], FNP

## 2022-12-20 ENCOUNTER — Other Ambulatory Visit (INDEPENDENT_AMBULATORY_CARE_PROVIDER_SITE_OTHER): Payer: No Typology Code available for payment source

## 2022-12-20 ENCOUNTER — Encounter: Payer: Self-pay | Admitting: Orthopedic Surgery

## 2022-12-20 ENCOUNTER — Ambulatory Visit (INDEPENDENT_AMBULATORY_CARE_PROVIDER_SITE_OTHER): Payer: No Typology Code available for payment source | Admitting: Orthopedic Surgery

## 2022-12-20 VITALS — BP 141/87 | HR 86 | Ht 71.0 in | Wt 282.0 lb

## 2022-12-20 DIAGNOSIS — S72415D Nondisplaced unspecified condyle fracture of lower end of left femur, subsequent encounter for closed fracture with routine healing: Secondary | ICD-10-CM

## 2022-12-20 DIAGNOSIS — S92255D Nondisplaced fracture of navicular [scaphoid] of left foot, subsequent encounter for fracture with routine healing: Secondary | ICD-10-CM | POA: Diagnosis not present

## 2022-12-20 NOTE — Progress Notes (Signed)
Return patient Visit  Assessment: Andrea [name] is a 42 y.o. female with the following: 1.  Fracture medial femoral condyle, at insertion of MCL 2. Closed nondisplaced fracture of navicular bone of left foot, subsequent encounter   Plan: Andrea [name] is getting better.  She continues to have some pain in the left knee.  There is some mild laxity to valgus stress at 30 degrees.  Radiographs of both the knee and the foot are stable.  I think she will continue to get better.  I offered her a brace, and will give her home exercises for the left knee.  If she continues to have issues, we can consider some physical therapy.  Regarding the left foot, she is in a regular shoe, wearing a sandal today.  This appears to be doing very well.  She may notice some residual swelling, but this is normal.  I will see her back in 6 weeks for repeat evaluation.  If she is doing well at that time, she can call to cancel.  Follow-up: Return in about 6 weeks (around 01/31/2023).  Subjective:  Chief Complaint  Patient presents with   left knee injury    Medial femoral condyle avulsion fracture, left foot avulsion fracture    History of Present Illness: Andrea [name] is a 42 y.o. female who returns for evaluation of left leg pain.  Injury was sustained approximately 3 months ago.  She has both left knee, left foot pain.  Her left foot is doing much better.  Function of the left knee is better, although she continues to have some pain.  She has good range of motion.  She states that she has issues with pain in the left knee at nighttime.    Review of Systems: No fevers or chills No numbness or tingling No chest pain No shortness of breath No bowel or bladder dysfunction No GI distress No headaches   Objective: BP (!) 141/87   Pulse 86   Ht 5\' 11"  (1.803 m)   Wt 282 lb (127.9 kg)   LMP  (LMP Unknown)   BMI 39.33 kg/m   Physical Exam:  General: Alert and oriented. and No acute  distress. Gait: Left-sided antalgic gait, wearing sandals today  Left knee without swelling.  Mild tenderness palpation over the medial femoral condyle.  She can achieve full extension.  She can flex beyond 110 degrees.  No increased laxity to valgus stress with full extension.  A little bit of laxity is appreciated with valgus stress at 30 degrees.  No swelling of the foot.  No tenderness palpation of the dorsal aspect of the foot.  Toes are warm and well-perfused.   IMAGING: I personally reviewed images previously obtained from the ED   X-rays left knee were obtained in clinic today.  There is a nondisplaced fracture of the medial femoral condyle at the insertion of the MCL.  There has been no change in appearance of the avulsion fragment.  No interval displacement.  Joint space is preserved.  Overall alignment remains unchanged.  Impression: Stable left medial femoral condyle avulsion fracture   X-rays left foot were obtained in clinic today.  No acute injuries noted.  There is a small avulsion fracture of the dorsal aspect of the navicular.  This remains in unchanged position.  No bony lesions.  No additional injuries.  Impression: Stable left navicular avulsion fracture   New Medications:  No orders of the defined types were placed in this encounter.  Oliver Barre, MD  12/20/2022 11:25 AM

## 2022-12-20 NOTE — Patient Instructions (Signed)
Knee Exercises  Ask your health care provider which exercises are safe for you. Do exercises exactly as told by your health care provider and adjust them as directed. It is normal to feel mild stretching, pulling, tightness, or discomfort as you do these exercises. Stop right away if you feel sudden pain or your pain gets worse. Do not begin these exercises until told by your health care provider.  Stretching and range-of-motion exercises These exercises warm up your muscles and joints and improve the movement and flexibility of your knee. These exercises also help to relieve pain and swelling.  Knee extension, prone Lie on your abdomen (prone position) on a bed. Place your left / right knee just beyond the edge of the surface so your knee is not on the bed. You can put a towel under your left / right thigh just above your kneecap for comfort. Relax your leg muscles and allow gravity to straighten your knee (extension). You should feel a stretch behind your left / right knee. Hold this position for 10 seconds. Scoot up so your knee is supported between repetitions. Repeat 10 times. Complete this exercise 3-4 times per week.     Knee flexion, active Lie on your back with both legs straight. If this causes back discomfort, bend your left / right knee so your foot is flat on the floor. Slowly slide your left / right heel back toward your buttocks. Stop when you feel a gentle stretch in the front of your knee or thigh (flexion). Hold this position for 10 seconds. Slowly slide your left / right heel back to the starting position. Repeat 10 times. Complete this exercise 3-4 times per week.      Quadriceps stretch, prone Lie on your abdomen on a firm surface, such as a bed or padded floor. Bend your left / right knee and hold your ankle. If you cannot reach your ankle or pant leg, loop a belt around your foot and grab the belt instead. Gently pull your heel toward your buttocks. Your knee should  not slide out to the side. You should feel a stretch in the front of your thigh and knee (quadriceps). Hold this position for 10 seconds. Repeat 10 times. Complete this exercise 3-4 times per week.      Hamstring, supine Lie on your back (supine position). Loop a belt or towel over the ball of your left / right foot. The ball of your foot is on the walking surface, right under your toes. Straighten your left / right knee and slowly pull on the belt to raise your leg until you feel a gentle stretch behind your knee (hamstring). Do not let your knee bend while you do this. Keep your other leg flat on the floor. Hold this position for 10 seconds. Repeat 10 times. Complete this exercise 3-4 times per week.   Strengthening exercises These exercises build strength and endurance in your knee. Endurance is the ability to use your muscles for a long time, even after they get tired.  Quadriceps, isometric This exercise stretches the muscles in front of your thigh (quadriceps) without moving your knee joint (isometric). Lie on your back with your left / right leg extended and your other knee bent. Put a rolled towel or small pillow under your knee if told by your health care provider. Slowly tense the muscles in the front of your left / right thigh. You should see your kneecap slide up toward your hip or see increased dimpling   just above the knee. This motion will push the back of the knee toward the floor. For 10 seconds, hold the muscle as tight as you can without increasing your pain. Relax the muscles slowly and completely. Repeat 10 times. Complete this exercise 3-4 times per week. .     Straight leg raises This exercise stretches the muscles in front of your thigh (quadriceps) and the muscles that move your hips (hip flexors). Lie on your back with your left / right leg extended and your other knee bent. Tense the muscles in the front of your left / right thigh. You should see your kneecap  slide up or see increased dimpling just above the knee. Your thigh may even shake a bit. Keep these muscles tight as you raise your leg 4-6 inches (10-15 cm) off the floor. Do not let your knee bend. Hold this position for 10 seconds. Keep these muscles tense as you lower your leg. Relax your muscles slowly and completely after each repetition. Repeat 10 times. Complete this exercise 3-4 times per week.  Hamstring, isometric Lie on your back on a firm surface. Bend your left / right knee about 30 degrees. Dig your left / right heel into the surface as if you are trying to pull it toward your buttocks. Tighten the muscles in the back of your thighs (hamstring) to "dig" as hard as you can without increasing any pain. Hold this position for 10 seconds. Release the tension gradually and allow your muscles to relax completely for __________ seconds after each repetition. Repeat 10 times. Complete this exercise 3-4 times per week.  Hamstring curls If told by your health care provider, do this exercise while wearing ankle weights. Begin with 5 lb weights. Then increase the weight by 1 lb (0.5 kg) increments. You can also use an exercise band Lie on your abdomen with your legs straight. Bend your left / right knee as far as you can without feeling pain. Keep your hips flat against the floor. Hold this position for 10 seconds. Slowly lower your leg to the starting position. Repeat 10 times. Complete this exercise 3-4 times per week.      Squats This exercise strengthens the muscles in front of your thigh and knee (quadriceps). Stand in front of a table, with your feet and knees pointing straight ahead. You may rest your hands on the table for balance but not for support. Slowly bend your knees and lower your hips like you are going to sit in a chair. Keep your weight over your heels, not over your toes. Keep your lower legs upright so they are parallel with the table legs. Do not let your hips  go lower than your knees. Do not bend lower than told by your health care provider. If your knee pain increases, do not bend as low. Hold the squat position for 10 seconds. Slowly push with your legs to return to standing. Do not use your hands to pull yourself to standing. Repeat 10 times. Complete this exercise 3-4 times per week .     Wall slides This exercise strengthens the muscles in front of your thigh and knee (quadriceps). Lean your back against a smooth wall or door, and walk your feet out 18-24 inches (46-61 cm) from it. Place your feet hip-width apart. Slowly slide down the wall or door until your knees bend 90 degrees. Keep your knees over your heels, not over your toes. Keep your knees in line with your hips. Hold   this position for 10 seconds. Repeat 10 times. Complete this exercise 3-4 times per week.      Straight leg raises This exercise strengthens the muscles that rotate the leg at the hip and move it away from your body (hip abductors). Lie on your side with your left / right leg in the top position. Lie so your head, shoulder, knee, and hip line up. You may bend your bottom knee to help you keep your balance. Roll your hips slightly forward so your hips are stacked directly over each other and your left / right knee is facing forward. Leading with your heel, lift your top leg 4-6 inches (10-15 cm). You should feel the muscles in your outer hip lifting. Do not let your foot drift forward. Do not let your knee roll toward the ceiling. Hold this position for 10 seconds. Slowly return your leg to the starting position. Let your muscles relax completely after each repetition. Repeat 10 times. Complete this exercise 3-4 times per week.      Straight leg raises This exercise stretches the muscles that move your hips away from the front of the pelvis (hip extensors). Lie on your abdomen on a firm surface. You can put a pillow under your hips if that is more  comfortable. Tense the muscles in your buttocks and lift your left / right leg about 4-6 inches (10-15 cm). Keep your knee straight as you lift your leg. Hold this position for 10 seconds. Slowly lower your leg to the starting position. Let your leg relax completely after each repetition. Repeat 10 times. Complete this exercise 3-4 times per week.  

## 2022-12-22 ENCOUNTER — Telehealth: Payer: BC Managed Care – PPO | Admitting: Family

## 2022-12-22 DIAGNOSIS — J020 Streptococcal pharyngitis: Secondary | ICD-10-CM | POA: Diagnosis not present

## 2022-12-22 DIAGNOSIS — Z20818 Contact with and (suspected) exposure to other bacterial communicable diseases: Secondary | ICD-10-CM | POA: Diagnosis not present

## 2022-12-22 MED ORDER — CLINDAMYCIN HCL 300 MG PO CAPS
300.00 mg | ORAL_CAPSULE | Freq: Three times a day (TID) | ORAL | 0 refills | Status: AC
Start: 2022-12-22 — End: 2023-01-01

## 2022-12-22 NOTE — Progress Notes (Signed)
E-Visit for Sore Throat - Strep Symptoms  We are sorry that you are not feeling well.  Here is how we plan to help!  Based on what you have shared with me it is likely that you have strep pharyngitis.  Strep pharyngitis is inflammation and infection in the back of the throat.  This is an infection cause by bacteria and is treated with antibiotics.  I have prescribed Clindamycin 300 mg three times a day for 10 days. For throat pain, we recommend over the counter oral pain relief medications such as acetaminophen or aspirin, or anti-inflammatory medications such as ibuprofen or naproxen sodium. Topical treatments such as oral throat lozenges or sprays may be used as needed. Strep infections are not as easily transmitted as other respiratory infections, however we still recommend that you avoid close contact with loved ones, especially the very young and elderly.  Remember to wash your hands thoroughly throughout the day as this is the number one way to prevent the spread of infection and wipe down door knobs and counters with disinfectant.   Home Care: Only take medications as instructed by your medical team. Complete the entire course of an antibiotic. Do not take these medications with alcohol. A steam or ultrasonic humidifier can help congestion.  You can place a towel over your head and breathe in the steam from hot water coming from a faucet. Avoid close contacts especially the very young and the elderly. Cover your mouth when you cough or sneeze. Always remember to wash your hands.  Get Help Right Away If: You develop worsening fever or sinus pain. You develop a severe head ache or visual changes. Your symptoms persist after you have completed your treatment plan.  Make sure you Understand these instructions. Will watch your condition. Will get help right away if you are not doing well or get worse.   Thank you for choosing an e-visit.  Your e-visit answers were reviewed by a board  certified advanced clinical practitioner to complete your personal care plan. Depending upon the condition, your plan could have included both over the counter or prescription medications.  Please review your pharmacy choice. Make sure the pharmacy is open so you can pick up prescription now. If there is a problem, you may contact your provider through MyChart messaging and have the prescription routed to another pharmacy.  Your safety is important to us. If you have drug allergies check your prescription carefully.   For the next 24 hours you can use MyChart to ask questions about today's visit, request a non-urgent call back, or ask for a work or school excuse. You will get an email in the next two days asking about your experience. I hope that your e-visit has been valuable and will speed your recovery.  Approximately 5 minutes was spent documenting and reviewing patient's chart.    

## 2023-01-04 ENCOUNTER — Other Ambulatory Visit (HOSPITAL_COMMUNITY): Payer: Self-pay

## 2023-01-04 ENCOUNTER — Telehealth: Payer: Self-pay | Admitting: Pharmacy Technician

## 2023-01-04 NOTE — Telephone Encounter (Signed)
Pharmacy Patient Advocate Encounter   Received notification from CoverMyMeds that prior authorization for [facility] ALPena 120MG  is required/requested.   Insurance verification completed.   The patient is insured through [employer] .   Per test claim: PA submitted to [facility] via CoverMyMeds Key/confirmation #/[id number] Status is pending

## 2023-01-06 ENCOUNTER — Encounter (HOSPITAL_COMMUNITY): Payer: Self-pay | Admitting: Psychiatry

## 2023-01-06 ENCOUNTER — Other Ambulatory Visit (HOSPITAL_COMMUNITY): Payer: Self-pay

## 2023-01-06 ENCOUNTER — Telehealth (INDEPENDENT_AMBULATORY_CARE_PROVIDER_SITE_OTHER): Payer: BC Managed Care – PPO | Admitting: Psychiatry

## 2023-01-06 DIAGNOSIS — G4709 Other insomnia: Secondary | ICD-10-CM | POA: Diagnosis not present

## 2023-01-06 DIAGNOSIS — F410 Panic disorder [episodic paroxysmal anxiety] without agoraphobia: Secondary | ICD-10-CM | POA: Diagnosis not present

## 2023-01-06 DIAGNOSIS — F4310 Post-traumatic stress disorder, unspecified: Secondary | ICD-10-CM | POA: Diagnosis not present

## 2023-01-06 DIAGNOSIS — F411 Generalized anxiety disorder: Secondary | ICD-10-CM

## 2023-01-06 DIAGNOSIS — G43009 Migraine without aura, not intractable, without status migrainosus: Secondary | ICD-10-CM

## 2023-01-06 DIAGNOSIS — G894 Chronic pain syndrome: Secondary | ICD-10-CM

## 2023-01-06 DIAGNOSIS — F331 Major depressive disorder, recurrent, moderate: Secondary | ICD-10-CM

## 2023-01-06 DIAGNOSIS — Z79899 Other long term (current) drug therapy: Secondary | ICD-10-CM

## 2023-01-06 DIAGNOSIS — F445 Conversion disorder with seizures or convulsions: Secondary | ICD-10-CM

## 2023-01-06 MED ORDER — HYDROXYZINE HCL 25 MG PO TABS
25.0000 mg | ORAL_TABLET | Freq: Three times a day (TID) | ORAL | 2 refills | Status: DC | PRN
Start: 2023-01-06 — End: 2023-03-31

## 2023-01-06 MED ORDER — MIRTAZAPINE 45 MG PO TABS
45.0000 mg | ORAL_TABLET | Freq: Every day | ORAL | 0 refills | Status: DC
Start: 2023-01-06 — End: 2023-01-09

## 2023-01-06 MED ORDER — PRAZOSIN HCL 1 MG PO CAPS
3.0000 mg | ORAL_CAPSULE | Freq: Every day | ORAL | 0 refills | Status: DC
Start: 2023-01-06 — End: 2023-03-24

## 2023-01-06 MED ORDER — GABAPENTIN 300 MG PO CAPS: ORAL_CAPSULE | ORAL | 1 refills | Status: DC

## 2023-01-06 MED ORDER — DULOXETINE HCL 60 MG PO CPEP: 120.0000 mg | ORAL_CAPSULE | Freq: Every day | ORAL | 0 refills | Status: DC
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
## 2023-01-06 NOTE — Patient Instructions (Signed)
We discontinued the as needed dosing of gabapentin today.  We otherwise kept your medication regimen the same with planned taper of Remeron (mirtazapine) in the future to avoid being on so many different serotonin based medications (Cymbalta, trazodone, Remeron).  And look out for something called serotonin syndrome which could include tremor, heart palpitations, sweating, flushing.  If this occurs let me or your PCP know immediately.

## 2023-01-06 NOTE — Telephone Encounter (Signed)
Pharmacy Patient Advocate Encounter  Received notification from [facility] that Prior Authorization for Emgality 120MG /ML auto-injectors (migraine) has been APPROVED from 01-05-2023 to 01-04-2024.Marland Kitchen  PA #/Case ID/Reference #: [id number]  Co-pay is $30.00 per 30 day supply.

## 2023-01-08 ENCOUNTER — Encounter (HOSPITAL_COMMUNITY): Payer: Self-pay

## 2023-01-09 ENCOUNTER — Other Ambulatory Visit (HOSPITAL_COMMUNITY): Payer: Self-pay | Admitting: Psychiatry

## 2023-01-09 DIAGNOSIS — F4310 Post-traumatic stress disorder, unspecified: Secondary | ICD-10-CM

## 2023-01-09 DIAGNOSIS — F410 Panic disorder [episodic paroxysmal anxiety] without agoraphobia: Secondary | ICD-10-CM

## 2023-01-09 DIAGNOSIS — F445 Conversion disorder with seizures or convulsions: Secondary | ICD-10-CM

## 2023-01-09 DIAGNOSIS — F331 Major depressive disorder, recurrent, moderate: Secondary | ICD-10-CM

## 2023-01-09 MED ORDER — MIRTAZAPINE 15 MG PO TABS
ORAL_TABLET | ORAL | 0 refills | Status: DC
Start: 2023-01-09 — End: 2023-02-06

## 2023-01-09 NOTE — Progress Notes (Signed)
Patient with tremulousness after PCP started trazodone in addition to remeron for insomnia. Provided patient with taper of remeron to avoid serotonin syndrome as well as discontinuation symptoms.

## 2023-01-13 MED ORDER — EMGALITY 120 MG/ML ~~LOC~~ SOAJ: 1.0000 | SUBCUTANEOUS | 11 refills | Status: DC

## 2023-01-13 NOTE — Addendum Note (Signed)
Addended by: Dimas Chyle on: 01/13/2023 09:27 AM   Modules accepted: Orders

## 2023-01-29 ENCOUNTER — Telehealth: Payer: BC Managed Care – PPO | Admitting: Physician Assistant

## 2023-01-29 DIAGNOSIS — H66002 Acute suppurative otitis media without spontaneous rupture of ear drum, left ear: Secondary | ICD-10-CM | POA: Diagnosis not present

## 2023-01-29 MED ORDER — CIPROFLOXACIN-DEXAMETHASONE 0.3-0.1 % OT SUSP
4.0000 [drp] | Freq: Two times a day (BID) | OTIC | 0 refills | Status: DC
Start: 2023-01-29 — End: 2023-02-14

## 2023-01-29 NOTE — Progress Notes (Signed)
E-Visit for Ear Pain - Acute Otitis Media   We are sorry that you are not feeling well. Here is how we plan to help!  Based on what you have shared with me it looks like you have Acute Otitis Media.  Acute Otitis Media is an infection of the middle or "inner" ear. This type of infection can cause redness, inflammation, and fluid buildup behind the tympanic membrane (ear drum).  The usual symptoms include: Earache/Pain Fever Upper respiratory symptoms Lack of energy/Fatigue/Malaise Slight hearing loss gradually worsening- if the inner ear fills with fluid What causes middle ear infections? Most middle ear infections occur when an infection such as a cold, leads to a build-up of mucus in the middle ear and causes the Eustachian tube (a thin tube that runs from the middle ear to the back of the nose) to become swollen or blocked.   This means mucus can't drain away properly, making it easier for an infection to spread into the middle ear.  How middle ear infections are treated: Most ear infections clear up within three to five days and don't need any specific treatment. If necessary, tylenol or ibuprofen should be used to relieve pain and a high temperature.  If you develop a fever higher than 102, or any significantly worsening symptoms, this could indicate a more serious infection moving to the middle/inner and needs face to face evaluation in an office by a provider.   Antibiotics aren't routinely used to treat middle ear infections, although they may occasionally be prescribed if symptoms persist or are particularly severe. Given your presentation:  I would recommend to continue Augmentin and Prednisone.  I will add: Ciprodex ear drops Place 3-4 drops into affected ear twice daily for 7 days   Your symptoms should improve over the next 3 days and should resolve in about 7 days. Be sure to complete ALL of the prescription(s) given.  HOME CARE: Wash your hands frequently. If you are  prescribed an ear drop, do not place the tip of the bottle on your ear or touch it with your fingers. You can take Acetaminophen 650 mg every 4-6 hours as needed for pain.  If pain is severe or moderate, you can apply a heating pad (set on low) or hot water bottle (wrapped in a towel) to outer ear for 20 minutes.  This will also increase drainage.  GET HELP RIGHT AWAY IF: Fever is over 102.2 degrees. You develop progressive ear pain or hearing loss. Ear symptoms persist longer than 3 days after treatment.  MAKE SURE YOU: Understand these instructions. Will watch your condition. Will get help right away if you are not doing well or get worse.  Thank you for choosing an e-visit.  Your e-visit answers were reviewed by a board certified advanced clinical practitioner to complete your personal care plan. Depending upon the condition, your plan could have included both over the counter or prescription medications.  Please review your pharmacy choice. Make sure the pharmacy is open so you can pick up the prescription now. If there is a problem, you may contact your provider through [employer] and have the prescription routed to another pharmacy.  Your safety is important to [place]. If you have drug allergies check your prescription carefully.   For the next 24 hours you can use MyChart to ask questions about today's visit, request a non-urgent call back, or ask for a work or school excuse. You will get an email with a survey after your  eVisit asking about your experience. We would appreciate your feedback. I hope that your e-visit has been valuable and will aid in your recovery.   I have spent 5 minutes in review of e-visit questionnaire, review and updating patient chart, medical decision making and response to patient.   Margaretann Loveless, PA-C

## 2023-01-31 ENCOUNTER — Ambulatory Visit: Payer: No Typology Code available for payment source | Admitting: Orthopedic Surgery

## 2023-02-03 ENCOUNTER — Telehealth (HOSPITAL_COMMUNITY): Payer: Self-pay | Admitting: *Deleted

## 2023-02-03 NOTE — Telephone Encounter (Signed)
Patient's  husband [name]) called requested a a call  back # [phone] concerning wife's different behavior. Stated wife has been talking & saying things not normal  of her & has suddenly moved out. Concerned wife may have stopped taking her medication

## 2023-02-06 ENCOUNTER — Telehealth (INDEPENDENT_AMBULATORY_CARE_PROVIDER_SITE_OTHER): Payer: BC Managed Care – PPO | Admitting: Psychiatry

## 2023-02-06 ENCOUNTER — Encounter (HOSPITAL_COMMUNITY): Payer: Self-pay | Admitting: Psychiatry

## 2023-02-06 DIAGNOSIS — F4310 Post-traumatic stress disorder, unspecified: Secondary | ICD-10-CM

## 2023-02-06 DIAGNOSIS — F445 Conversion disorder with seizures or convulsions: Secondary | ICD-10-CM

## 2023-02-06 DIAGNOSIS — F331 Major depressive disorder, recurrent, moderate: Secondary | ICD-10-CM

## 2023-02-06 DIAGNOSIS — F410 Panic disorder [episodic paroxysmal anxiety] without agoraphobia: Secondary | ICD-10-CM

## 2023-02-06 DIAGNOSIS — G4709 Other insomnia: Secondary | ICD-10-CM

## 2023-02-06 DIAGNOSIS — Z79899 Other long term (current) drug therapy: Secondary | ICD-10-CM

## 2023-02-06 DIAGNOSIS — F411 Generalized anxiety disorder: Secondary | ICD-10-CM | POA: Diagnosis not present

## 2023-02-06 DIAGNOSIS — G43009 Migraine without aura, not intractable, without status migrainosus: Secondary | ICD-10-CM

## 2023-02-06 DIAGNOSIS — G8929 Other chronic pain: Secondary | ICD-10-CM

## 2023-02-06 MED ORDER — GABAPENTIN 300 MG PO CAPS
ORAL_CAPSULE | ORAL | 1 refills | Status: DC
Start: 2023-02-06 — End: 2023-03-24
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
## 2023-02-06 NOTE — Patient Instructions (Signed)
We did not make any medication changes today.  If things continue to go well by the time we see each other again in October we will likely look to continue to decrease your overall medication burden.  Keep up the good work.

## 2023-02-12 ENCOUNTER — Other Ambulatory Visit (HOSPITAL_COMMUNITY): Payer: Self-pay | Admitting: Psychiatry

## 2023-02-12 DIAGNOSIS — F445 Conversion disorder with seizures or convulsions: Secondary | ICD-10-CM

## 2023-02-12 DIAGNOSIS — F410 Panic disorder [episodic paroxysmal anxiety] without agoraphobia: Secondary | ICD-10-CM

## 2023-02-12 DIAGNOSIS — F4310 Post-traumatic stress disorder, unspecified: Secondary | ICD-10-CM

## 2023-02-12 DIAGNOSIS — F331 Major depressive disorder, recurrent, moderate: Secondary | ICD-10-CM

## 2023-02-14 ENCOUNTER — Telehealth: Payer: BC Managed Care – PPO | Admitting: Urgent Care

## 2023-02-14 DIAGNOSIS — R300 Dysuria: Secondary | ICD-10-CM

## 2023-02-14 NOTE — Progress Notes (Signed)
Andrea [name],  We are sorry you are not feeling well. Because of your history of pyelonephritis and kidney stones in addition to your back pain and vomiting, I feel your condition warrants further evaluation and I recommend that you be seen in a face to face visit. Only simple UTIs without back pain, fevers, or nausea/vomiting can be safely treated through the telehealth department.   Please head to the closest urgent care at your earliest convenience.   NOTE: There will be NO CHARGE for this eVisit   If you are having a true medical emergency please call 911.      For an urgent face to face visit, [name] has eight urgent care centers for your convenience:   NEW!! [facility] Urgent Care Center at [facility] Get Driving Directions [phone] [address], [address] [place], [address]    [facility] Health Urgent Care Center at [facility] Get Driving Directions [phone] [address] [place], [place] [address]   [facility] Health Urgent Care Center [facility]) Get Driving Directions [phone] [address] [place], [place] [address]  [facility] Health Urgent Care Center [facility]) Get Driving Directions [phone] [address] [place],  [place]  [address]  [facility] Health Urgent Care Center [facility] - at [employer]  [phone] [address] [place],  [place] [address]   [facility] Health Urgent Care at [facility] Get Driving Directions [phone] [address] [place], [place] [address]   [facility] Health Urgent Care at [facility] Get Driving Directions  [phone] [address] [place], [place] [address]   [facility] Health Urgent Care at [facility] Directions [phone] [address], [address] [place], [place] [address]  Your MyChart E-visit questionnaire answers were reviewed by a board certified advanced clinical practitioner  to complete your personal care plan based on your specific symptoms.  Thank you for using e-Visits.

## 2023-03-07 ENCOUNTER — Ambulatory Visit: Payer: BC Managed Care – PPO | Admitting: Orthopedic Surgery

## 2023-03-11 ENCOUNTER — Telehealth (HOSPITAL_COMMUNITY): Payer: BC Managed Care – PPO | Admitting: Psychiatry

## 2023-03-17 ENCOUNTER — Encounter: Payer: Self-pay | Admitting: Neurology

## 2023-03-18 MED ORDER — QULIPTA 30 MG PO TABS: ORAL_TABLET | ORAL | 5 refills | Status: DC

## 2023-03-20 ENCOUNTER — Telehealth: Payer: Self-pay

## 2023-03-20 NOTE — Telephone Encounter (Signed)
Pt needs a PA for Qulipta 30 mg

## 2023-03-24 ENCOUNTER — Encounter (HOSPITAL_COMMUNITY): Payer: Self-pay | Admitting: Psychiatry

## 2023-03-24 ENCOUNTER — Telehealth (INDEPENDENT_AMBULATORY_CARE_PROVIDER_SITE_OTHER): Payer: BC Managed Care – PPO | Admitting: Psychiatry

## 2023-03-24 DIAGNOSIS — R569 Unspecified convulsions: Secondary | ICD-10-CM

## 2023-03-24 DIAGNOSIS — F411 Generalized anxiety disorder: Secondary | ICD-10-CM | POA: Diagnosis not present

## 2023-03-24 DIAGNOSIS — F445 Conversion disorder with seizures or convulsions: Secondary | ICD-10-CM

## 2023-03-24 DIAGNOSIS — G988 Other disorders of nervous system: Secondary | ICD-10-CM

## 2023-03-24 DIAGNOSIS — G43009 Migraine without aura, not intractable, without status migrainosus: Secondary | ICD-10-CM

## 2023-03-24 DIAGNOSIS — F4310 Post-traumatic stress disorder, unspecified: Secondary | ICD-10-CM | POA: Diagnosis not present

## 2023-03-24 DIAGNOSIS — F410 Panic disorder [episodic paroxysmal anxiety] without agoraphobia: Secondary | ICD-10-CM | POA: Diagnosis not present

## 2023-03-24 DIAGNOSIS — G4709 Other insomnia: Secondary | ICD-10-CM

## 2023-03-24 DIAGNOSIS — G894 Chronic pain syndrome: Secondary | ICD-10-CM

## 2023-03-24 DIAGNOSIS — G4700 Insomnia, unspecified: Secondary | ICD-10-CM

## 2023-03-24 DIAGNOSIS — G8929 Other chronic pain: Secondary | ICD-10-CM

## 2023-03-24 DIAGNOSIS — G43909 Migraine, unspecified, not intractable, without status migrainosus: Secondary | ICD-10-CM

## 2023-03-24 DIAGNOSIS — F331 Major depressive disorder, recurrent, moderate: Secondary | ICD-10-CM

## 2023-03-24 DIAGNOSIS — Z79899 Other long term (current) drug therapy: Secondary | ICD-10-CM

## 2023-03-24 MED ORDER — GABAPENTIN 300 MG PO CAPS
ORAL_CAPSULE | ORAL | 1 refills | Status: DC
Start: 2023-03-24 — End: 2023-04-21

## 2023-03-24 MED ORDER — PRAZOSIN HCL 2 MG PO CAPS
4.0000 mg | ORAL_CAPSULE | Freq: Every day | ORAL | 0 refills | Status: DC
Start: 2023-03-24 — End: 2023-06-20

## 2023-03-24 MED ORDER — DULOXETINE HCL 60 MG PO CPEP
120.0000 mg | ORAL_CAPSULE | Freq: Every day | ORAL | 0 refills | Status: DC
Start: 2023-03-24 — End: 2023-06-20
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
## 2023-03-24 NOTE — Patient Instructions (Signed)
We increased the prazosin to 4 mg nightly today.  This should help with the nightmares while we are waiting for getting an updated EKG when you see your PCP next.  The interval we are looking forward to the QTc.  If your PCP can fax [place] the results of this that would be appreciated.  If the QTc has normalized then we will plan on swapping the hydroxyzine for Seroquel (quetiapine) at 25 mg 3 times a day as needed.

## 2023-03-25 ENCOUNTER — Telehealth: Payer: BC Managed Care – PPO | Admitting: Physician Assistant

## 2023-03-25 DIAGNOSIS — R6889 Other general symptoms and signs: Secondary | ICD-10-CM | POA: Diagnosis not present

## 2023-03-25 MED ORDER — OSELTAMIVIR PHOSPHATE 75 MG PO CAPS
75.0000 mg | ORAL_CAPSULE | Freq: Two times a day (BID) | ORAL | 0 refills | Status: DC
Start: 2023-03-25 — End: 2023-04-02

## 2023-03-25 NOTE — Progress Notes (Signed)
I have spent 5 minutes in review of e-visit questionnaire, review and updating patient chart, medical decision making and response to patient.   Mia Milan Cody Jacklynn Dehaas, PA-C    

## 2023-03-25 NOTE — Progress Notes (Signed)
Message sent to patient requesting further input regarding current symptoms. Awaiting patient response.  

## 2023-03-25 NOTE — Progress Notes (Signed)
E visit for Flu like symptoms   We are sorry that you are not feeling well.  Here is how we plan to help! Based on what you have shared with me it looks like you may have a respiratory virus that may be influenza.  Influenza or "the flu" is   an infection caused by a respiratory virus. The flu virus is highly contagious and persons who did not receive their yearly flu vaccination may "catch" the flu from close contact.  We have anti-viral medications to treat the viruses that cause this infection. They are not a "cure" and only shorten the course of the infection. These prescriptions are most effective when they are given within the first 2 days of "flu" symptoms. Antiviral medication are indicated if you have a high risk of complications from the flu. You should  also consider an antiviral medication if you are in close contact with someone who is at risk. These medications can help patients avoid complications from the flu  but have side effects that you should know. Possible side effects from Tamiflu or oseltamivir include nausea, vomiting, diarrhea, dizziness, headaches, eye redness, sleep problems or other respiratory symptoms. You should not take Tamiflu if you have an allergy to oseltamivir or any to the ingredients in Tamiflu.  Based upon your symptoms and potential risk factors I have prescribed Oseltamivir (Tamiflu).  It has been sent to your designated pharmacy.  You will take one 75 mg capsule orally twice a day for the next 5 days. and I recommend that you follow the flu symptoms recommendation that I have listed below.  Please keep well-hydrated and try to get plenty of rest. If you have a humidifier, place it in the bedroom and run it at night. Start a saline nasal rinse for nasal congestion. You can consider use of a nasal steroid spray like Flonase or Nasacort OTC. You can alternate between Tylenol and Ibuprofen if needed for fever, body aches, headache and/or throat pain. Salt  water-gargles and chloraseptic spray can be very beneficial for sore throat. Mucinex-DM for congestion or cough. Please take all prescribed medications as directed.  Remain out of work until fever-free for 24 hours without a fever-reducing medication, and you are feeling better.  You should mask until symptoms are resolved.  If anything worsens despite treatment, you need to be evaluated in-person. Please do not delay care.   ANYONE WHO HAS FLU SYMPTOMS SHOULD: Stay home. The flu is highly contagious and going out or to work exposes others! Be sure to drink plenty of fluids. Water is fine as well as fruit juices, sodas and electrolyte beverages. You may want to stay away from caffeine or alcohol. If you are nauseated, try taking small sips of liquids. How do you know if you are getting enough fluid? Your urine should be a pale yellow or almost colorless. Get rest. Taking a steamy shower or using a humidifier may help nasal congestion and ease sore throat pain. Using a saline nasal spray works much the same way. Cough drops, hard candies and sore throat lozenges may ease your cough. Line up a caregiver. Have someone check on you regularly.   GET HELP RIGHT AWAY IF: You cannot keep down liquids or your medications. You become short of breath Your fell like you are going to pass out or loose consciousness. Your symptoms persist after you have completed your treatment plan MAKE SURE YOU  Understand these instructions. Will watch your condition. Will get help right   away if you are not doing well or get worse.  Your e-visit answers were reviewed by a board certified advanced clinical practitioner to complete your personal care plan.  Depending on the condition, your plan could have included both over the counter or prescription medications.  If there is a problem please reply  once you have received a response from your provider.  Your safety is important to us.  If you have drug allergies  check your prescription carefully.    You can use MyChart to ask questions about today's visit, request a non-urgent call back, or ask for a work or school excuse for 24 hours related to this e-Visit. If it has been greater than 24 hours you will need to follow up with your provider, or enter a new e-Visit to address those concerns.  You will get an e-mail in the next two days asking about your experience.  I hope that your e-visit has been valuable and will speed your recovery. Thank you for using e-visits.  

## 2023-03-27 NOTE — Telephone Encounter (Signed)
Following up on pt PA for [facility]

## 2023-03-30 ENCOUNTER — Emergency Department: Payer: BC Managed Care – PPO

## 2023-03-30 ENCOUNTER — Emergency Department
Admission: EM | Admit: 2023-03-30 | Discharge: 2023-03-30 | Disposition: A | Discharge status: Home / Self Care | Payer: BC Managed Care – PPO | Attending: Emergency Medicine | Admitting: Emergency Medicine

## 2023-03-30 ENCOUNTER — Other Ambulatory Visit: Payer: Self-pay

## 2023-03-30 DIAGNOSIS — J4541 Moderate persistent asthma with (acute) exacerbation: Secondary | ICD-10-CM | POA: Diagnosis not present

## 2023-03-30 DIAGNOSIS — I10 Essential (primary) hypertension: Secondary | ICD-10-CM | POA: Insufficient documentation

## 2023-03-30 DIAGNOSIS — Z20822 Contact with and (suspected) exposure to covid-19: Secondary | ICD-10-CM | POA: Insufficient documentation

## 2023-03-30 DIAGNOSIS — R0602 Shortness of breath: Secondary | ICD-10-CM | POA: Diagnosis present

## 2023-03-30 LAB — HEPATIC FUNCTION PANEL
ALT: 27 U/L (ref 0–44)
AST: 21 U/L (ref 15–41)
Albumin: 4.2 g/dL (ref 3.5–5.0)
Alkaline Phosphatase: 93 U/L (ref 38–126)
Bilirubin, Direct: <0.1 mg/dL (ref 0.0–0.2)
Total Bilirubin: 0.8 mg/dL (ref 0.3–1.2)
Total Protein: 7.1 g/dL (ref 6.5–8.1)

## 2023-03-30 LAB — BASIC METABOLIC PANEL
Anion gap: 12 (ref 5–15)
BUN: 25 mg/dL — ABNORMAL HIGH (ref 6–20)
CO2: 25 mmol/L (ref 22–32)
Calcium: 9.6 mg/dL (ref 8.9–10.3)
Chloride: 104 mmol/L (ref 98–111)
Creatinine, Ser: 1 mg/dL (ref 0.44–1.00)
GFR, Estimated: >60 mL/min (ref >60)
Glucose, Bld: 188 mg/dL — ABNORMAL HIGH (ref 70–99)
Potassium: 4.4 mmol/L (ref 3.5–5.1)
Sodium: 141 mmol/L (ref 135–145)

## 2023-03-30 LAB — CBC
HCT: 43.9 % (ref 36.0–46.0)
Hemoglobin: 13.6 g/dL (ref 12.0–15.0)
MCH: 28.8 pg (ref 26.0–34.0)
MCHC: 31 g/dL (ref 30.0–36.0)
MCV: 93 fL (ref 80.0–100.0)
Platelets: 236 10*3/uL (ref 150–400)
RBC: 4.72 MIL/uL (ref 3.87–5.11)
RDW: 12.5 % (ref 11.5–15.5)
WBC: 13.8 10*3/uL — ABNORMAL HIGH (ref 4.0–10.5)
nRBC: 0 % (ref 0.0–0.2)

## 2023-03-30 LAB — RESP PANEL BY RT-PCR (RSV, FLU A&B, COVID)  RVPGX2
Influenza A by PCR: NEGATIVE
Influenza B by PCR: NEGATIVE
Resp Syncytial Virus by PCR: NEGATIVE
SARS Coronavirus 2 by RT PCR: NEGATIVE

## 2023-03-30 LAB — TROPONIN I (HIGH SENSITIVITY)
Troponin I (High Sensitivity): 3 ng/L (ref <18)
Troponin I (High Sensitivity): 3 ng/L (ref <18)

## 2023-03-30 LAB — D-DIMER, QUANTITATIVE: D-Dimer, Quant: <0.27 ug{FEU}/mL (ref 0.00–0.50)

## 2023-03-30 LAB — LIPASE, BLOOD: Lipase: 57 U/L — ABNORMAL HIGH (ref 11–51)

## 2023-03-30 MED ORDER — ALBUTEROL SULFATE (2.5 MG/3ML) 0.083% IN NEBU
10.0000 mg | INHALATION_SOLUTION | Freq: Once | RESPIRATORY_TRACT | Status: AC
  Administered 2023-03-30: 10 mg via RESPIRATORY_TRACT
  Filled 2023-03-30: qty 12

## 2023-03-30 MED ORDER — ALBUTEROL SULFATE (2.5 MG/3ML) 0.083% IN NEBU
5.0000 mg | INHALATION_SOLUTION | Freq: Once | RESPIRATORY_TRACT | Status: AC
  Administered 2023-03-30: 5 mg via RESPIRATORY_TRACT
  Filled 2023-03-30: qty 6

## 2023-03-30 MED ORDER — PREDNISONE 20 MG PO TABS: 40.0000 mg | ORAL_TABLET | Freq: Every day | ORAL | 0 refills | Status: AC

## 2023-03-30 MED ORDER — IPRATROPIUM-ALBUTEROL 0.5-2.5 (3) MG/3ML IN SOLN
3.0000 mL | Freq: Once | RESPIRATORY_TRACT | Status: AC
  Administered 2023-03-30: 3 mL via RESPIRATORY_TRACT
  Filled 2023-03-30: qty 3

## 2023-03-30 MED ORDER — PREDNISONE 20 MG PO TABS
60.0000 mg | ORAL_TABLET | Freq: Once | ORAL | Status: AC
  Administered 2023-03-30: 60 mg via ORAL
  Filled 2023-03-30: qty 3

## 2023-03-30 NOTE — ED Notes (Signed)
EDP at bedside  

## 2023-03-30 NOTE — ED Provider Notes (Signed)
[facility] Provider Note    Event Date/Time   First MD Initiated Contact with Patient 03/30/23 1532     (approximate)   History   Chief Complaint: Shortness of Breath   HPI  Andrea [name] is a 42 y.o. female with a history of migraines, hypertension, schizophrenia who comes ED complaining of shortness of breath for the past 3 days.  No chest pain.  No leg swelling.  She does report a history of asthma.  She was started on prednisone and azithromycin a few days ago after symptom onset.  She does endorse chills.     Physical Exam   Triage Vital Signs: ED Triage Vitals  Encounter Vitals Group     BP 03/30/23 1308 101/80     Systolic BP Percentile --      Diastolic BP Percentile --      Pulse Rate 03/30/23 1308 86     Resp 03/30/23 1308 19     Temp 03/30/23 1308 98 F (36.7 C)     Temp Source 03/30/23 1308 Oral     SpO2 03/30/23 1308 96 %     Weight 03/30/23 1309 285 lb (129.3 kg)     Height 03/30/23 1309 5\' 11"  (1.803 m)     Head Circumference --      Peak Flow --      Pain Score 03/30/23 1309 8     Pain Loc --      Pain Education --      Exclude from Growth Chart --     Most recent vital signs: Vitals:   03/30/23 1630 03/30/23 1700  BP: 107/60 115/77  Pulse: 82 88  Resp: 14 20  Temp:    SpO2: 98% 100%    General: Awake, no distress.  CV:  Good peripheral perfusion.  Regular rate and rhythm Resp:  Normal effort.  Slight expiratory wheezing, worse on the right.  Somewhat diminished air movement on the right. Abd:  No distention.  Soft nontender Other:  No lower extremity edema   ED Results / Procedures / Treatments   Labs (all labs ordered are listed, but only abnormal results are displayed) Labs Reviewed  BASIC METABOLIC PANEL - Abnormal; Notable for the following components:      Result Value   Glucose, Bld 188 (*)    BUN 25 (*)    All other components within normal limits  CBC - Abnormal; Notable for the following  components:   WBC 13.8 (*)    All other components within normal limits  LIPASE, BLOOD - Abnormal; Notable for the following components:   Lipase 57 (*)    All other components within normal limits  RESP PANEL BY RT-PCR (RSV, FLU A&B, COVID)  RVPGX2  D-DIMER, QUANTITATIVE  HEPATIC FUNCTION PANEL  TROPONIN I (HIGH SENSITIVITY)  TROPONIN I (HIGH SENSITIVITY)     EKG Interpreted by me Sinus rhythm rate of 76.  Normal axis and intervals.  Poor R wave progression.  Normal ST segments and T waves.   RADIOLOGY Chest x-ray interpreted by me, no focal findings.  Radiology report reviewed noting mild interstitial opacities, possible viral illness   PROCEDURES:  Procedures   MEDICATIONS ORDERED IN ED: Medications  predniSONE (DELTASONE) tablet 60 mg (has no administration in time range)  albuterol (PROVENTIL) (2.5 MG/3ML) 0.083% nebulizer solution 10 mg (has no administration in time range)  ipratropium-albuterol (DUONEB) 0.5-2.5 (3) MG/3ML nebulizer solution 3 mL (3 mLs Nebulization Given 03/30/23 1615)  albuterol (PROVENTIL) (2.5 MG/3ML) 0.083% nebulizer solution 5 mg (5 mg Nebulization Given 03/30/23 1615)     IMPRESSION / MDM / ASSESSMENT AND PLAN / ED COURSE  I reviewed the triage vital signs and the nursing notes.  DDx: Asthma exacerbation, pneumonia, pneumothorax, viral illness, COVID, pulmonary embolism, non-STEMI  Patient's presentation is most consistent with acute presentation with potential threat to life or bodily function.  Patient presents with shortness of breath for the past 3 days.  Does have an abnormal lung exam.  Vital signs are normal.  Troponins, D-dimer, other labs are unremarkable.  She has a mild leukocytosis which I think is due to her prednisone use over the last few days.  COVID and flu are negative.  Patient given bronchodilators in the ED.   ----------------------------------------- 6:15 PM on  03/30/2023 ----------------------------------------- Workup reassuring.  Feeling better, lung exam improved.  Comfortable with discharge home.  She clarifies that the prednisone she has been taking last few days was only 5 mg daily and has run out.  She is also run out of her nebulizer solution at home but has a refill waiting for her at the pharmacy that she can pick up tomorrow.  Will start on higher dose prednisone burst over the next few days, give prednisone and additional albuterol nebs here prior to discharge.      FINAL CLINICAL IMPRESSION(S) / ED DIAGNOSES   Final diagnoses:  Moderate persistent asthma with exacerbation     Rx / DC Orders   ED Discharge Orders          Ordered    predniSONE (DELTASONE) 20 MG tablet  Daily with breakfast        03/30/23 1808             Note:  This document was prepared using Dragon voice recognition software and may include unintentional dictation errors.   Sharman Cheek, MD 03/30/23 1816

## 2023-03-30 NOTE — ED Triage Notes (Signed)
Pt sts that she has been SOB for the last three days.

## 2023-03-30 NOTE — ED Notes (Signed)
Pt to ED with husband, pt had URI earlier this week, was on abx and prednisone by PCP, then 2 days ago pt began with chest tightness and getting out of breath with exertion. States was going up stairs on Friday and had to stop. States also got SOB today while sitting.

## 2023-03-31 ENCOUNTER — Other Ambulatory Visit (HOSPITAL_COMMUNITY): Payer: Self-pay | Admitting: Psychiatry

## 2023-03-31 ENCOUNTER — Encounter (HOSPITAL_COMMUNITY): Payer: Self-pay

## 2023-03-31 DIAGNOSIS — G4709 Other insomnia: Secondary | ICD-10-CM

## 2023-03-31 DIAGNOSIS — F410 Panic disorder [episodic paroxysmal anxiety] without agoraphobia: Secondary | ICD-10-CM

## 2023-03-31 MED ORDER — QUETIAPINE FUMARATE 25 MG PO TABS
25.0000 mg | ORAL_TABLET | Freq: Three times a day (TID) | ORAL | 2 refills | Status: DC | PRN
Start: 2023-03-31 — End: 2023-04-21

## 2023-03-31 NOTE — Progress Notes (Signed)
Patient was able to get EKG which showed QTc of 393 ms.  We will proceed with swapping out hydroxyzine for Seroquel at 25 mg 3 times a day as needed for anxiety/insomnia.

## 2023-04-01 ENCOUNTER — Telehealth: Payer: Self-pay | Admitting: Pharmacy Technician

## 2023-04-01 ENCOUNTER — Other Ambulatory Visit (HOSPITAL_COMMUNITY): Payer: Self-pay

## 2023-04-01 NOTE — Telephone Encounter (Signed)
Pharmacy Patient Advocate Encounter  Received notification from [facility] that Prior Authorization for QULIPTA 30MG  has been APPROVED from 10.15.24 to 1.15.25. Ran test claim, Copay is $0. This test claim was processed through [facility]- copay amounts may vary at other pharmacies due to pharmacy/plan contracts, or as the patient moves through the different stages of their insurance plan.   PA #/Case ID/Reference #: [id number]

## 2023-04-01 NOTE — Telephone Encounter (Signed)
Pharmacy Patient Advocate Encounter   Received notification from Physician's Office that prior authorization for QULIPTA 30MG  is required/requested.   Insurance verification completed.   The patient is insured through [facility] .   Per test claim: PA required; PA submitted to [facility] via CoverMyMeds Key/confirmation #/EOC [facility] Status is pending

## 2023-04-02 ENCOUNTER — Encounter: Payer: Self-pay | Admitting: Student in an Organized Health Care Education/Training Program

## 2023-04-02 ENCOUNTER — Ambulatory Visit: Payer: BC Managed Care – PPO | Admitting: Student in an Organized Health Care Education/Training Program

## 2023-04-02 VITALS — BP 108/80 | HR 83 | Temp 97.8°F | Ht 71.0 in | Wt 274.6 lb

## 2023-04-02 DIAGNOSIS — J4531 Mild persistent asthma with (acute) exacerbation: Secondary | ICD-10-CM

## 2023-04-02 NOTE — Progress Notes (Signed)
Synopsis: Referred in for asthma and shortness of breat by Golden Pop, FNP  Assessment & Plan:   1. Mild persistent asthma with acute exacerbation  Presenting for the evaluation of significant shortness of breath in the setting of known history of asthma.  On physical exam today, patient's lungs are clear and she does not have any wheezes. We attempted to do FeNO in clinic today but the patient was unable to perform the necessary maneuver.  Reviewed inhaler technique with her and she appears to be using it correctly.  Overall, I suspect that she had an asthma exacerbation that is now better controlled. Patient is on multiple psychotropic medications but I don't suspect that she's developed a pneumonitis or an interstitial process given benign exam and normal oxygenation.  She continues to have shortness of breath which we will further evaluate with a pulmonary function test.  Her D-dimer and troponin levels were normal during her ED presentation recently which is reassuring.  The chest x-ray was reviewed by me and while there is some increased interstitial markings, I suspect this likely from bronchial wall thickening and possibly her viral illness.  I will consider obtaining a CT scan of the chest pending the findings from her pulmonary function test.  - Pulmonary Function Test [facility] Only; Future   Return in about 4 weeks (around 04/30/2023).  I spent 62 minutes caring for this patient today, including preparing to see the patient, obtaining a medical history , reviewing a separately obtained history, performing a medically appropriate examination and/or evaluation, counseling and educating the patient/family/caregiver, ordering medications, tests, or procedures, documenting clinical information in the electronic health record, and independently interpreting results (not separately reported/billed) and communicating results to the patient/family/caregiver  Raechel Chute, MD [place]  [facility]   End of visit medications:  No orders of the defined types were placed in this encounter.    Current Outpatient Medications:    albuterol (VENTOLIN HFA) 108 (90 Base) MCG/ACT inhaler, Inhale 1-2 puffs into the lungs every 6 (six) hours as needed for wheezing or shortness of breath., Disp: 18 g, Rfl: 0   Atogepant (QULIPTA) 30 MG TABS, Take 1 tablet daily, Disp: 30 tablet, Rfl: 5   DULoxetine (CYMBALTA) 60 MG capsule, Take 2 capsules (120 mg total) by mouth daily., Disp: 180 capsule, Rfl: 0   empagliflozin (JARDIANCE) 25 MG TABS tablet, Take 25 mg by mouth every morning., Disp: , Rfl:    gabapentin (NEURONTIN) 300 MG capsule, Take 1 pill each morning, one at noon, and 3 at night., Disp: 200 capsule, Rfl: 1   Galcanezumab-gnlm (EMGALITY) 120 MG/ML SOAJ, Inject 1 Pen into the skin every 30 (thirty) days., Disp: 1.12 mL, Rfl: 11   GEMTESA 75 MG TABS, Take 1 tablet by mouth daily., Disp: , Rfl:    levocetirizine (XYZAL) 5 MG tablet, Take 5 mg by mouth at bedtime., Disp: , Rfl:    nabumetone (RELAFEN) 750 MG tablet, Take 750 mg by mouth 2 (two) times daily., Disp: , Rfl:    ondansetron (ZOFRAN-ODT) 4 MG disintegrating tablet, Take 1 tablet (4 mg total) by mouth every 8 (eight) hours as needed for nausea or vomiting., Disp: 20 tablet, Rfl: 11   pantoprazole (PROTONIX) 40 MG tablet, Take 40 mg by mouth every morning., Disp: , Rfl:    potassium chloride (MICRO-K) 10 MEQ CR capsule, Take 10 mEq by mouth daily., Disp: , Rfl:    prazosin (MINIPRESS) 2 MG capsule, Take 2 capsules (4 mg  total) by mouth at bedtime., Disp: 180 capsule, Rfl: 0   predniSONE (DELTASONE) 20 MG tablet, Take 2 tablets (40 mg total) by mouth daily with breakfast for 4 days., Disp: 8 tablet, Rfl: 0   QUEtiapine (SEROQUEL) 25 MG tablet, Take 1 tablet (25 mg total) by mouth 3 (three) times daily as needed (Insomnia, anxiety)., Disp: 90 tablet, Rfl: 2   RYBELSUS 14 MG TABS, Take 1 tablet by mouth daily., Disp:  , Rfl:    SINGULAIR 10 MG tablet, Take 10 mg by mouth daily., Disp: , Rfl:    spironolactone (ALDACTONE) 25 MG tablet, Take 25 mg by mouth daily., Disp: , Rfl:    SUMAtriptan (IMITREX) 100 MG tablet, Take 1 tablet at onset of migraine. May May repeat in 2 hours if headache persists or recurs. Do not take more than 3 a week, Disp: 10 tablet, Rfl: 11   SYMBICORT 160-4.5 MCG/ACT inhaler, Inhale 2 puffs into the lungs., Disp: , Rfl:    tiZANidine (ZANAFLEX) 4 MG tablet, Take 4 mg by mouth every 8 (eight) hours as needed for muscle spasms., Disp: , Rfl:    traZODone (DESYREL) 150 MG tablet, Take 150 mg by mouth at bedtime., Disp: , Rfl:    zonisamide (ZONEGRAN) 100 MG capsule, Take 5 capsules every night, Disp: 150 capsule, Rfl: 11   Subjective:   PATIENT ID: Andrea [name] GENDER: female DOB: 11-Oct-1980, MRN: [id number]  Chief Complaint  Patient presents with   pulmonary consult    Recent ED visit-asthma. Cough, shortness of breath and wheezing.     HPI  Patient is a pleasant 42 year old female presenting to clinic today for evaluation of shortness of breath.  She carries a diagnosis of asthma for the last few years and is maintained on Symbicort that she uses "seasonally".  She reports that symptoms exacerbated and worsened around 2 weeks ago.  She was seen in the ED where an evaluation was performed and was reassuring (CXR, d-dimer, troponin, BNP, CBC), she was discharged on prednisone as well as antibiotics with improvement.  She was also seen by her primary care physician and continued to report symptoms prompting referral to pulmonology.  Patient is reporting exertional dyspnea that is sometimes felt at rest.  This is associated with a cough that is nonproductive and occasional wheezing.  The wheezing is most notable with exercise.  She does not have any fevers or chills at the moment but did have symptoms of an upper respiratory tract infection over a week ago which appears to have  improved.  Patient does have some chest tightness associated with shortness of breath.  She reports restarting her Symbicort around a week ago and is currently using it twice daily.  She was also given Airsupra by her primary care provider which she has been using as well.  She reports some improvement with the use of Airsupra but does not feel a difference with Symbicort.  Patient is a non-smoker and denies any occupational exposures.  Ancillary information including prior medications, full medical/surgical/family/social histories, and PFTs (when available) are listed below and have been reviewed.   Review of Systems  Constitutional:  Positive for malaise/fatigue. Negative for chills, diaphoresis, fever and weight loss.  Respiratory:  Positive for cough, sputum production, shortness of breath and wheezing. Negative for hemoptysis.   Cardiovascular:  Positive for chest pain. Negative for palpitations.  Skin:  Negative for rash.     Objective:   Vitals:   04/02/23 1058  BP: 108/80  Pulse: 83  Temp: 97.8 F (36.6 C)  TempSrc: Temporal  SpO2: 98%  Weight: 274 lb 9.6 oz (124.6 kg)  Height: 5\' 11"  (1.803 m)   98% on  RA BMI Readings from Last 3 Encounters:  04/02/23 38.30 kg/m  03/30/23 39.75 kg/m  12/20/22 39.33 kg/m   Wt Readings from Last 3 Encounters:  04/02/23 274 lb 9.6 oz (124.6 kg)  03/30/23 285 lb (129.3 kg)  12/20/22 282 lb (127.9 kg)    Physical Exam Constitutional:      General: She is not in acute distress.    Appearance: Normal appearance. She is ill-appearing.  Cardiovascular:     Rate and Rhythm: Normal rate and regular rhythm.     Pulses: Normal pulses.     Heart sounds: Normal heart sounds.  Pulmonary:     Effort: Pulmonary effort is normal. No respiratory distress.     Breath sounds: Normal breath sounds. No stridor. No wheezing, rhonchi or rales.  Chest:     Chest wall: No tenderness.  Abdominal:     Palpations: Abdomen is soft.   Musculoskeletal:     Right lower leg: No edema.     Left lower leg: No edema.  Neurological:     General: No focal deficit present.     Mental Status: She is alert and oriented to person, place, and time. Mental status is at baseline.       Ancillary Information    Past Medical History:  Diagnosis Date   Anxiety    Bipolar 1 disorder (HCC)    Bipolar 1 disorder, mixed, severe (HCC) 12/20/2016   Bipolar I disorder, most recent episode depressed (HCC) 12/20/2016   Complication of anesthesia    hard time waking up    Depression    Diabetes mellitus without complication (HCC)    Drug induced akathisia 04/02/2022   GERD (gastroesophageal reflux disease)    no meds   Kidney stones    Long term current use of antipsychotic medication 03/13/2022   Low back pain    Migraines    PCOS (polycystic ovarian syndrome)    PONV (postoperative nausea and vomiting)    Schizophrenia (HCC)    Seizure (HCC) 10/03/2021   Seizures (HCC) 06/04/2016   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Marilynne Drivers 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   Shortness of breath dyspnea    with bronchitis     Family History  Problem Relation Age of Onset   Healthy Mother    Healthy Father      Past Surgical History:  Procedure Laterality Date   ABDOMINAL HYSTERECTOMY N/A 11/14/2015   Procedure: HYSTERECTOMY ABDOMINAL;  Surgeon: Levi Aland, MD;  Location: [facility];  Service: Gynecology;  Laterality: N/A;   CHOLECYSTECTOMY     INTRAUTERINE DEVICE (IUD) INSERTION  06/14/2014   [facility]   LYMPH NODES REMOVED     ovarian cyst removed     SALPINGOOPHORECTOMY Bilateral 11/14/2015   Procedure: SALPINGO OOPHORECTOMY;  Surgeon: Levi Aland, MD;  Location: [facility];  Service: Gynecology;  Laterality: Bilateral;    Social History   Socioeconomic History   Marital status: Divorced    Spouse name: Not on file   Number of children: 0   Years of education: 12   Highest education level: Not on file  Occupational History   Not on  file  Tobacco Use   Smoking status: Never   Smokeless tobacco: Never  Vaping Use   Vaping status: Never Used  Substance and Sexual  Activity   Alcohol use: Never   Drug use: Never   Sexual activity: Never  Other Topics Concern   Not on file  Social History Narrative   Right handed   Drinks caffeine   One story home   Social Determinants of Health   Financial Resource Strain: Not on file  Food Insecurity: Not on file  Transportation Needs: Not on file  Physical Activity: Not on file  Stress: Not on file  Social Connections: Not on file  Intimate Partner Violence: Not on file     Allergies  Allergen Reactions   Bactrim [Sulfamethoxazole-Trimethoprim] Hives and Itching   Codeine Hives and Itching     CBC    Component Value Date/Time   WBC 13.8 (H) 03/30/2023 1311   RBC 4.72 03/30/2023 1311   HGB 13.6 03/30/2023 1311   HCT 43.9 03/30/2023 1311   PLT 236 03/30/2023 1311   MCV 93.0 03/30/2023 1311   MCH 28.8 03/30/2023 1311   MCHC 31.0 03/30/2023 1311   RDW 12.5 03/30/2023 1311   LYMPHSABS 2.8 10/04/2022 0132   MONOABS 0.7 10/04/2022 0132   EOSABS 0.1 10/04/2022 0132   BASOSABS 0.1 10/04/2022 0132    Pulmonary Functions Testing Results:     No data to display          Outpatient Medications Prior to Visit  Medication Sig Dispense Refill   albuterol (VENTOLIN HFA) 108 (90 Base) MCG/ACT inhaler Inhale 1-2 puffs into the lungs every 6 (six) hours as needed for wheezing or shortness of breath. 18 g 0   Atogepant (QULIPTA) 30 MG TABS Take 1 tablet daily 30 tablet 5   DULoxetine (CYMBALTA) 60 MG capsule Take 2 capsules (120 mg total) by mouth daily. 180 capsule 0   empagliflozin (JARDIANCE) 25 MG TABS tablet Take 25 mg by mouth every morning.     gabapentin (NEURONTIN) 300 MG capsule Take 1 pill each morning, one at noon, and 3 at night. 200 capsule 1   Galcanezumab-gnlm (EMGALITY) 120 MG/ML SOAJ Inject 1 Pen into the skin every 30 (thirty) days. 1.12 mL 11    GEMTESA 75 MG TABS Take 1 tablet by mouth daily.     levocetirizine (XYZAL) 5 MG tablet Take 5 mg by mouth at bedtime.     nabumetone (RELAFEN) 750 MG tablet Take 750 mg by mouth 2 (two) times daily.     ondansetron (ZOFRAN-ODT) 4 MG disintegrating tablet Take 1 tablet (4 mg total) by mouth every 8 (eight) hours as needed for nausea or vomiting. 20 tablet 11   pantoprazole (PROTONIX) 40 MG tablet Take 40 mg by mouth every morning.     potassium chloride (MICRO-K) 10 MEQ CR capsule Take 10 mEq by mouth daily.     prazosin (MINIPRESS) 2 MG capsule Take 2 capsules (4 mg total) by mouth at bedtime. 180 capsule 0   predniSONE (DELTASONE) 20 MG tablet Take 2 tablets (40 mg total) by mouth daily with breakfast for 4 days. 8 tablet 0   QUEtiapine (SEROQUEL) 25 MG tablet Take 1 tablet (25 mg total) by mouth 3 (three) times daily as needed (Insomnia, anxiety). 90 tablet 2   RYBELSUS 14 MG TABS Take 1 tablet by mouth daily.     SINGULAIR 10 MG tablet Take 10 mg by mouth daily.     spironolactone (ALDACTONE) 25 MG tablet Take 25 mg by mouth daily.     SUMAtriptan (IMITREX) 100 MG tablet Take 1 tablet at onset of migraine. May May  repeat in 2 hours if headache persists or recurs. Do not take more than 3 a week 10 tablet 11   SYMBICORT 160-4.5 MCG/ACT inhaler Inhale 2 puffs into the lungs.     tiZANidine (ZANAFLEX) 4 MG tablet Take 4 mg by mouth every 8 (eight) hours as needed for muscle spasms.     traZODone (DESYREL) 150 MG tablet Take 150 mg by mouth at bedtime.     zonisamide (ZONEGRAN) 100 MG capsule Take 5 capsules every night 150 capsule 11   oseltamivir (TAMIFLU) 75 MG capsule Take 1 capsule (75 mg total) by mouth 2 (two) times daily. 10 capsule 0   No facility-administered medications prior to visit.

## 2023-04-14 ENCOUNTER — Ambulatory Visit: Payer: BC Managed Care – PPO | Admitting: Gastroenterology

## 2023-04-21 ENCOUNTER — Telehealth (INDEPENDENT_AMBULATORY_CARE_PROVIDER_SITE_OTHER): Payer: BC Managed Care – PPO | Admitting: Psychiatry

## 2023-04-21 ENCOUNTER — Encounter (HOSPITAL_COMMUNITY): Payer: Self-pay | Admitting: Psychiatry

## 2023-04-21 ENCOUNTER — Telehealth: Payer: BC Managed Care – PPO | Admitting: Physician Assistant

## 2023-04-21 DIAGNOSIS — T3695XA Adverse effect of unspecified systemic antibiotic, initial encounter: Secondary | ICD-10-CM

## 2023-04-21 DIAGNOSIS — B379 Candidiasis, unspecified: Secondary | ICD-10-CM | POA: Diagnosis not present

## 2023-04-21 DIAGNOSIS — B9689 Other specified bacterial agents as the cause of diseases classified elsewhere: Secondary | ICD-10-CM

## 2023-04-21 DIAGNOSIS — F331 Major depressive disorder, recurrent, moderate: Secondary | ICD-10-CM

## 2023-04-21 DIAGNOSIS — G43009 Migraine without aura, not intractable, without status migrainosus: Secondary | ICD-10-CM | POA: Diagnosis not present

## 2023-04-21 DIAGNOSIS — G4709 Other insomnia: Secondary | ICD-10-CM | POA: Diagnosis not present

## 2023-04-21 DIAGNOSIS — J019 Acute sinusitis, unspecified: Secondary | ICD-10-CM | POA: Diagnosis not present

## 2023-04-21 DIAGNOSIS — Z79899 Other long term (current) drug therapy: Secondary | ICD-10-CM

## 2023-04-21 DIAGNOSIS — F410 Panic disorder [episodic paroxysmal anxiety] without agoraphobia: Secondary | ICD-10-CM

## 2023-04-21 DIAGNOSIS — F445 Conversion disorder with seizures or convulsions: Secondary | ICD-10-CM

## 2023-04-21 DIAGNOSIS — F411 Generalized anxiety disorder: Secondary | ICD-10-CM

## 2023-04-21 DIAGNOSIS — F4310 Post-traumatic stress disorder, unspecified: Secondary | ICD-10-CM | POA: Diagnosis not present

## 2023-04-21 MED ORDER — AMOXICILLIN-POT CLAVULANATE 875-125 MG PO TABS
1.0000 | ORAL_TABLET | Freq: Two times a day (BID) | ORAL | 0 refills | Status: DC
Start: 2023-04-21 — End: 2023-05-04

## 2023-04-21 MED ORDER — QUETIAPINE FUMARATE 50 MG PO TABS
ORAL_TABLET | ORAL | 1 refills | Status: DC
Start: 2023-04-21 — End: 2023-05-13

## 2023-04-21 MED ORDER — GABAPENTIN 300 MG PO CAPS
ORAL_CAPSULE | ORAL | 1 refills | Status: DC
Start: 2023-04-21 — End: 2023-07-21

## 2023-04-21 MED ORDER — FLUCONAZOLE 150 MG PO TABS
150.0000 mg | ORAL_TABLET | ORAL | 0 refills | Status: DC | PRN
Start: 2023-04-21 — End: 2023-05-04

## 2023-04-21 NOTE — Progress Notes (Signed)
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
## 2023-04-21 NOTE — Patient Instructions (Signed)
We increased the dose of Seroquel to 50 mg in the morning, at midday, and 100 mg (2 tablets) at night.  We also decreased the dose of the gabapentin to 300 mg in the morning, 300 mg at midday, and 600 mg (2 capsules) at night.  We will likely look at changing your gabapentin dose further with time and looking to come off of the trazodone.

## 2023-04-22 ENCOUNTER — Ambulatory Visit: Payer: BC Managed Care – PPO | Attending: Student in an Organized Health Care Education/Training Program

## 2023-04-22 DIAGNOSIS — R059 Cough, unspecified: Secondary | ICD-10-CM | POA: Insufficient documentation

## 2023-04-22 DIAGNOSIS — J4531 Mild persistent asthma with (acute) exacerbation: Secondary | ICD-10-CM | POA: Insufficient documentation

## 2023-04-22 DIAGNOSIS — R0609 Other forms of dyspnea: Secondary | ICD-10-CM | POA: Diagnosis not present

## 2023-04-22 LAB — PULMONARY FUNCTION TEST ARMC ONLY
DL/VA % pred: 135 %
DL/VA: 5.69 ml/min/mmHg/L
DLCO unc % pred: 113 %
DLCO unc: 30.77 ml/min/mmHg
FEF 25-75 Post: 4.99 L/s
FEF 25-75 Pre: 4.26 L/s
FEF2575-%Change-Post: 16 %
FEF2575-%Pred-Post: 143 %
FEF2575-%Pred-Pre: 122 %
FEV1-%Change-Post: 4 %
FEV1-%Pred-Post: 89 %
FEV1-%Pred-Pre: 85 %
FEV1-Post: 3.28 L
FEV1-Pre: 3.14 L
FEV1FVC-%Change-Post: 1 %
FEV1FVC-%Pred-Pre: 108 %
FEV6-%Change-Post: 3 %
FEV6-%Pred-Post: 81 %
FEV6-%Pred-Pre: 79 %
FEV6-Post: 3.65 L
FEV6-Pre: 3.54 L
FEV6FVC-%Pred-Post: 102 %
FEV6FVC-%Pred-Pre: 102 %
FVC-%Change-Post: 3 %
FVC-%Pred-Post: 79 %
FVC-%Pred-Pre: 77 %
FVC-Post: 3.65 L
FVC-Pre: 3.54 L
Post FEV1/FVC ratio: 90 %
Post FEV6/FVC ratio: 100 %
Pre FEV1/FVC ratio: 89 %
Pre FEV6/FVC Ratio: 100 %
RV % pred: 55 %
RV: 1.1 L
TLC % pred: 74 %
TLC: 4.55 L

## 2023-04-22 MED ORDER — ALBUTEROL SULFATE (2.5 MG/3ML) 0.083% IN NEBU
2.5000 mg | INHALATION_SOLUTION | Freq: Once | RESPIRATORY_TRACT | Status: AC
  Administered 2023-04-22: 2.5 mg via RESPIRATORY_TRACT
  Filled 2023-04-22: qty 3

## 2023-04-29 ENCOUNTER — Other Ambulatory Visit: Payer: Self-pay | Admitting: Orthopedic Surgery

## 2023-05-03 ENCOUNTER — Inpatient Hospital Stay: Payer: BC Managed Care – PPO

## 2023-05-03 ENCOUNTER — Inpatient Hospital Stay
Admission: EM | Admit: 2023-05-03 | Discharge: 2023-05-04 | DRG: 493 | Disposition: A | Discharge status: Home / Self Care | Payer: BC Managed Care – PPO | Attending: Osteopathic Medicine | Admitting: Osteopathic Medicine

## 2023-05-03 ENCOUNTER — Inpatient Hospital Stay: Payer: BC Managed Care – PPO | Admitting: Anesthesiology

## 2023-05-03 ENCOUNTER — Emergency Department: Payer: BC Managed Care – PPO

## 2023-05-03 ENCOUNTER — Other Ambulatory Visit: Payer: Self-pay

## 2023-05-03 ENCOUNTER — Encounter
Admission: EM | Disposition: A | Discharge status: Home / Self Care | Payer: Self-pay | Source: Home / Self Care | Attending: Osteopathic Medicine

## 2023-05-03 DIAGNOSIS — K219 Gastro-esophageal reflux disease without esophagitis: Secondary | ICD-10-CM | POA: Diagnosis present

## 2023-05-03 DIAGNOSIS — F250 Schizoaffective disorder, bipolar type: Secondary | ICD-10-CM | POA: Diagnosis present

## 2023-05-03 DIAGNOSIS — Z885 Allergy status to narcotic agent status: Secondary | ICD-10-CM | POA: Diagnosis not present

## 2023-05-03 DIAGNOSIS — G8929 Other chronic pain: Secondary | ICD-10-CM | POA: Diagnosis present

## 2023-05-03 DIAGNOSIS — E1142 Type 2 diabetes mellitus with diabetic polyneuropathy: Secondary | ICD-10-CM | POA: Diagnosis present

## 2023-05-03 DIAGNOSIS — Z90710 Acquired absence of both cervix and uterus: Secondary | ICD-10-CM | POA: Diagnosis not present

## 2023-05-03 DIAGNOSIS — F319 Bipolar disorder, unspecified: Secondary | ICD-10-CM | POA: Insufficient documentation

## 2023-05-03 DIAGNOSIS — Z9049 Acquired absence of other specified parts of digestive tract: Secondary | ICD-10-CM

## 2023-05-03 DIAGNOSIS — Z881 Allergy status to other antibiotic agents status: Secondary | ICD-10-CM | POA: Diagnosis not present

## 2023-05-03 DIAGNOSIS — Z87442 Personal history of urinary calculi: Secondary | ICD-10-CM

## 2023-05-03 DIAGNOSIS — I10 Essential (primary) hypertension: Secondary | ICD-10-CM | POA: Diagnosis present

## 2023-05-03 DIAGNOSIS — S82851A Displaced trimalleolar fracture of right lower leg, initial encounter for closed fracture: Principal | ICD-10-CM

## 2023-05-03 DIAGNOSIS — R55 Syncope and collapse: Secondary | ICD-10-CM | POA: Diagnosis present

## 2023-05-03 DIAGNOSIS — Z6841 Body Mass Index (BMI) 40.0 and over, adult: Secondary | ICD-10-CM | POA: Diagnosis not present

## 2023-05-03 DIAGNOSIS — W19XXXA Unspecified fall, initial encounter: Principal | ICD-10-CM | POA: Diagnosis present

## 2023-05-03 DIAGNOSIS — J4530 Mild persistent asthma, uncomplicated: Secondary | ICD-10-CM | POA: Diagnosis present

## 2023-05-03 DIAGNOSIS — E66813 Obesity, class 3: Secondary | ICD-10-CM | POA: Diagnosis present

## 2023-05-03 DIAGNOSIS — Z79899 Other long term (current) drug therapy: Secondary | ICD-10-CM

## 2023-05-03 DIAGNOSIS — G40909 Epilepsy, unspecified, not intractable, without status epilepticus: Secondary | ICD-10-CM | POA: Diagnosis present

## 2023-05-03 DIAGNOSIS — Z7984 Long term (current) use of oral hypoglycemic drugs: Secondary | ICD-10-CM | POA: Diagnosis not present

## 2023-05-03 DIAGNOSIS — S82891A Other fracture of right lower leg, initial encounter for closed fracture: Secondary | ICD-10-CM | POA: Diagnosis not present

## 2023-05-03 HISTORY — PX: ORIF ANKLE FRACTURE: SHX5408

## 2023-05-03 LAB — CBG MONITORING, ED
Glucose-Capillary: 131 mg/dL — ABNORMAL HIGH (ref 70–99)
Glucose-Capillary: 170 mg/dL — ABNORMAL HIGH (ref 70–99)
Glucose-Capillary: 165 mg/dL — ABNORMAL HIGH (ref 70–99)

## 2023-05-03 LAB — COMPREHENSIVE METABOLIC PANEL
ALT: 21 U/L (ref 0–44)
AST: 17 U/L (ref 15–41)
Albumin: 3.8 g/dL (ref 3.5–5.0)
Alkaline Phosphatase: 93 U/L (ref 38–126)
Anion gap: 9 (ref 5–15)
BUN: 21 mg/dL — ABNORMAL HIGH (ref 6–20)
CO2: 26 mmol/L (ref 22–32)
Calcium: 9.1 mg/dL (ref 8.9–10.3)
Chloride: 104 mmol/L (ref 98–111)
Creatinine, Ser: 0.89 mg/dL (ref 0.44–1.00)
GFR, Estimated: >60 mL/min (ref >60)
Glucose, Bld: 146 mg/dL — ABNORMAL HIGH (ref 70–99)
Potassium: 4 mmol/L (ref 3.5–5.1)
Sodium: 139 mmol/L (ref 135–145)
Total Bilirubin: 0.8 mg/dL (ref <1.2)
Total Protein: 6.6 g/dL (ref 6.5–8.1)

## 2023-05-03 LAB — TROPONIN I (HIGH SENSITIVITY): Troponin I (High Sensitivity): 3 ng/L (ref <18)

## 2023-05-03 LAB — CBC WITH DIFFERENTIAL/PLATELET
Abs Immature Granulocytes: 0.09 10*3/uL — ABNORMAL HIGH (ref 0.00–0.07)
Basophils Absolute: 0.1 10*3/uL (ref 0.0–0.1)
Basophils Relative: 1 %
Eosinophils Absolute: 0.2 10*3/uL (ref 0.0–0.5)
Eosinophils Relative: 2 %
HCT: 40 % (ref 36.0–46.0)
Hemoglobin: 12.5 g/dL (ref 12.0–15.0)
Immature Granulocytes: 1 %
Lymphocytes Relative: 36 %
Lymphs Abs: 3.2 10*3/uL (ref 0.7–4.0)
MCH: 29.6 pg (ref 26.0–34.0)
MCHC: 31.3 g/dL (ref 30.0–36.0)
MCV: 94.6 fL (ref 80.0–100.0)
Monocytes Absolute: 0.5 10*3/uL (ref 0.1–1.0)
Monocytes Relative: 6 %
Neutro Abs: 4.9 10*3/uL (ref 1.7–7.7)
Neutrophils Relative %: 54 %
Platelets: 235 10*3/uL (ref 150–400)
RBC: 4.23 MIL/uL (ref 3.87–5.11)
RDW: 13.2 % (ref 11.5–15.5)
WBC: 8.9 10*3/uL (ref 4.0–10.5)
nRBC: 0 % (ref 0.0–0.2)

## 2023-05-03 LAB — HEMOGLOBIN A1C
Hgb A1c MFr Bld: 6.4 % — ABNORMAL HIGH (ref 4.8–5.6)
Mean Plasma Glucose: 136.98 mg/dL

## 2023-05-03 LAB — GLUCOSE, CAPILLARY
Glucose-Capillary: 158 mg/dL — ABNORMAL HIGH (ref 70–99)
Glucose-Capillary: 167 mg/dL — ABNORMAL HIGH (ref 70–99)

## 2023-05-03 SURGERY — OPEN REDUCTION INTERNAL FIXATION (ORIF) ANKLE FRACTURE
Anesthesia: General | Site: Ankle | Laterality: Right

## 2023-05-03 MED ORDER — MORPHINE SULFATE (PF) 2 MG/ML IV SOLN
2.0000 mg | INTRAVENOUS | Status: DC | PRN
  Administered 2023-05-03 – 2023-05-04 (×4): 2 mg via INTRAVENOUS
  Filled 2023-05-03 (×4): qty 1

## 2023-05-03 MED ORDER — MONTELUKAST SODIUM 10 MG PO TABS
10.0000 mg | ORAL_TABLET | Freq: Every day | ORAL | Status: DC
  Administered 2023-05-04: 10 mg via ORAL
  Filled 2023-05-03: qty 1

## 2023-05-03 MED ORDER — INSULIN ASPART 100 UNIT/ML IJ SOLN
0.0000 [IU] | Freq: Three times a day (TID) | INTRAMUSCULAR | Status: DC
  Administered 2023-05-03: 2 [IU] via SUBCUTANEOUS
  Administered 2023-05-03 – 2023-05-04 (×2): 3 [IU] via SUBCUTANEOUS
  Administered 2023-05-04: 2 [IU] via SUBCUTANEOUS
  Filled 2023-05-03 (×4): qty 1

## 2023-05-03 MED ORDER — ACETAMINOPHEN 325 MG PO TABS
650.0000 mg | ORAL_TABLET | Freq: Four times a day (QID) | ORAL | Status: DC | PRN
  Administered 2023-05-04: 650 mg via ORAL
  Filled 2023-05-03: qty 2

## 2023-05-03 MED ORDER — LORATADINE 10 MG PO TABS
10.0000 mg | ORAL_TABLET | Freq: Every day | ORAL | Status: DC
  Administered 2023-05-04: 10 mg via ORAL
  Filled 2023-05-03: qty 1

## 2023-05-03 MED ORDER — BUPIVACAINE HCL (PF) 0.25 % IJ SOLN
INTRAMUSCULAR | Status: AC
  Filled 2023-05-03: qty 30

## 2023-05-03 MED ORDER — FENTANYL CITRATE PF 50 MCG/ML IJ SOSY
50.0000 ug | PREFILLED_SYRINGE | Freq: Once | INTRAMUSCULAR | Status: AC
  Administered 2023-05-03: 50 ug via INTRAVENOUS
  Filled 2023-05-03: qty 1

## 2023-05-03 MED ORDER — PRAZOSIN HCL 2 MG PO CAPS
4.0000 mg | ORAL_CAPSULE | Freq: Every day | ORAL | Status: DC
  Administered 2023-05-03: 4 mg via ORAL
  Filled 2023-05-03 (×2): qty 2

## 2023-05-03 MED ORDER — CEFAZOLIN SODIUM-DEXTROSE 1-4 GM/50ML-% IV SOLN
1.0000 g | Freq: Three times a day (TID) | INTRAVENOUS | Status: AC
  Administered 2023-05-03 – 2023-05-04 (×3): 1 g via INTRAVENOUS
  Filled 2023-05-03 (×3): qty 50

## 2023-05-03 MED ORDER — MIRABEGRON ER 25 MG PO TB24
25.0000 mg | ORAL_TABLET | Freq: Every day | ORAL | Status: DC
  Administered 2023-05-04: 25 mg via ORAL
  Filled 2023-05-03 (×2): qty 1

## 2023-05-03 MED ORDER — MIDAZOLAM HCL 2 MG/2ML IJ SOLN
INTRAMUSCULAR | Status: DC | PRN
  Administered 2023-05-03: 2 mg via INTRAVENOUS

## 2023-05-03 MED ORDER — INSULIN ASPART 100 UNIT/ML IJ SOLN: 0.0000 [IU] | Freq: Every day | INTRAMUSCULAR | Status: DC

## 2023-05-03 MED ORDER — CEFAZOLIN SODIUM-DEXTROSE 1-4 GM/50ML-% IV SOLN
INTRAVENOUS | Status: AC
  Filled 2023-05-03: qty 50

## 2023-05-03 MED ORDER — LEVOCETIRIZINE DIHYDROCHLORIDE 5 MG PO TABS: 5.0000 mg | ORAL_TABLET | Freq: Every day | ORAL | Status: DC

## 2023-05-03 MED ORDER — SUMATRIPTAN SUCCINATE 50 MG PO TABS: 100.0000 mg | ORAL_TABLET | ORAL | Status: DC | PRN

## 2023-05-03 MED ORDER — OXYCODONE HCL 5 MG/5ML PO SOLN: 5.0000 mg | Freq: Once | ORAL | Status: DC | PRN

## 2023-05-03 MED ORDER — TRAZODONE HCL 50 MG PO TABS: 25.0000 mg | ORAL_TABLET | Freq: Every evening | ORAL | Status: DC | PRN

## 2023-05-03 MED ORDER — ONDANSETRON HCL 4 MG PO TABS: 4.0000 mg | ORAL_TABLET | Freq: Four times a day (QID) | ORAL | Status: DC | PRN

## 2023-05-03 MED ORDER — PHENYLEPHRINE 80 MCG/ML (10ML) SYRINGE FOR IV PUSH (FOR BLOOD PRESSURE SUPPORT)
PREFILLED_SYRINGE | INTRAVENOUS | Status: DC | PRN
  Administered 2023-05-03 (×2): 80 ug via INTRAVENOUS
  Administered 2023-05-03: 160 ug via INTRAVENOUS

## 2023-05-03 MED ORDER — ACETAMINOPHEN 10 MG/ML IV SOLN
INTRAVENOUS | Status: AC
Start: 2023-05-03 — End: ?
  Filled 2023-05-03: qty 100

## 2023-05-03 MED ORDER — EMPAGLIFLOZIN 25 MG PO TABS
25.0000 mg | ORAL_TABLET | Freq: Every morning | ORAL | Status: DC
  Administered 2023-05-04: 25 mg via ORAL
  Filled 2023-05-03 (×2): qty 1

## 2023-05-03 MED ORDER — FENTANYL CITRATE (PF) 100 MCG/2ML IJ SOLN: 25.0000 ug | INTRAMUSCULAR | Status: DC | PRN

## 2023-05-03 MED ORDER — MIDAZOLAM HCL 2 MG/2ML IJ SOLN
INTRAMUSCULAR | Status: AC
  Filled 2023-05-03: qty 2

## 2023-05-03 MED ORDER — LIDOCAINE HCL (PF) 2 % IJ SOLN
INTRAMUSCULAR | Status: AC
  Filled 2023-05-03: qty 5

## 2023-05-03 MED ORDER — SEMAGLUTIDE 14 MG PO TABS: 1.0000 | ORAL_TABLET | Freq: Every day | ORAL | Status: DC

## 2023-05-03 MED ORDER — DULOXETINE HCL 30 MG PO CPEP
120.0000 mg | ORAL_CAPSULE | Freq: Every day | ORAL | Status: DC
  Administered 2023-05-04: 120 mg via ORAL
  Filled 2023-05-03: qty 4

## 2023-05-03 MED ORDER — PHENYLEPHRINE 80 MCG/ML (10ML) SYRINGE FOR IV PUSH (FOR BLOOD PRESSURE SUPPORT)
PREFILLED_SYRINGE | INTRAVENOUS | Status: AC
  Filled 2023-05-03: qty 10

## 2023-05-03 MED ORDER — ACETAMINOPHEN 650 MG RE SUPP: 650.0000 mg | Freq: Four times a day (QID) | RECTAL | Status: DC | PRN

## 2023-05-03 MED ORDER — LIDOCAINE HCL (PF) 1 % IJ SOLN
INTRAMUSCULAR | Status: AC
  Filled 2023-05-03: qty 30

## 2023-05-03 MED ORDER — ROCURONIUM BROMIDE 10 MG/ML (PF) SYRINGE
PREFILLED_SYRINGE | INTRAVENOUS | Status: AC
  Filled 2023-05-03: qty 10

## 2023-05-03 MED ORDER — DEXAMETHASONE SODIUM PHOSPHATE 10 MG/ML IJ SOLN
INTRAMUSCULAR | Status: DC | PRN
  Administered 2023-05-03: 10 mg via INTRAVENOUS

## 2023-05-03 MED ORDER — GABAPENTIN 300 MG PO CAPS: 300.0000 mg | ORAL_CAPSULE | ORAL | Status: DC

## 2023-05-03 MED ORDER — GABAPENTIN 300 MG PO CAPS
300.0000 mg | ORAL_CAPSULE | Freq: Two times a day (BID) | ORAL | Status: DC
  Administered 2023-05-04 (×2): 300 mg via ORAL
  Filled 2023-05-03 (×2): qty 1

## 2023-05-03 MED ORDER — ONDANSETRON HCL 4 MG/2ML IJ SOLN: 4.0000 mg | Freq: Four times a day (QID) | INTRAMUSCULAR | Status: DC | PRN

## 2023-05-03 MED ORDER — ACETAMINOPHEN 10 MG/ML IV SOLN
INTRAVENOUS | Status: DC | PRN
  Administered 2023-05-03: 1000 mg via INTRAVENOUS

## 2023-05-03 MED ORDER — MAGNESIUM HYDROXIDE 400 MG/5ML PO SUSP: 30.0000 mL | Freq: Every day | ORAL | Status: DC | PRN

## 2023-05-03 MED ORDER — LIDOCAINE HCL (PF) 1 % IJ SOLN
INTRAMUSCULAR | Status: DC | PRN
  Administered 2023-05-03: 20 mL

## 2023-05-03 MED ORDER — CEFAZOLIN IN SODIUM CHLORIDE 3-0.9 GM/100ML-% IV SOLN
3.0000 g | Freq: Once | INTRAVENOUS | Status: AC
  Administered 2023-05-03: 3 g via INTRAVENOUS
  Filled 2023-05-03: qty 100

## 2023-05-03 MED ORDER — HYDROGEN PEROXIDE 3 % EX SOLN
CUTANEOUS | Status: DC | PRN
  Administered 2023-05-03: 1

## 2023-05-03 MED ORDER — QUETIAPINE FUMARATE 25 MG PO TABS
25.0000 mg | ORAL_TABLET | Freq: Two times a day (BID) | ORAL | Status: DC
  Administered 2023-05-04 (×2): 25 mg via ORAL
  Filled 2023-05-03 (×2): qty 1

## 2023-05-03 MED ORDER — QUETIAPINE FUMARATE 25 MG PO TABS
50.0000 mg | ORAL_TABLET | Freq: Every day | ORAL | Status: DC
  Administered 2023-05-03: 50 mg via ORAL
  Filled 2023-05-03: qty 2

## 2023-05-03 MED ORDER — FENTANYL CITRATE (PF) 100 MCG/2ML IJ SOLN
INTRAMUSCULAR | Status: AC
  Filled 2023-05-03: qty 2

## 2023-05-03 MED ORDER — DEXMEDETOMIDINE HCL IN NACL 80 MCG/20ML IV SOLN
INTRAVENOUS | Status: DC | PRN
  Administered 2023-05-03 (×3): 4 ug via INTRAVENOUS

## 2023-05-03 MED ORDER — ONDANSETRON HCL 4 MG/2ML IJ SOLN
INTRAMUSCULAR | Status: DC | PRN
  Administered 2023-05-03: 4 mg via INTRAVENOUS

## 2023-05-03 MED ORDER — DEXAMETHASONE SODIUM PHOSPHATE 10 MG/ML IJ SOLN
INTRAMUSCULAR | Status: AC
  Filled 2023-05-03: qty 1

## 2023-05-03 MED ORDER — PROPOFOL 10 MG/ML IV BOLUS
INTRAVENOUS | Status: AC
  Filled 2023-05-03: qty 20

## 2023-05-03 MED ORDER — ZONISAMIDE 100 MG PO CAPS
500.0000 mg | ORAL_CAPSULE | Freq: Every day | ORAL | Status: DC
  Administered 2023-05-03: 500 mg via ORAL
  Filled 2023-05-03 (×2): qty 5

## 2023-05-03 MED ORDER — QUETIAPINE FUMARATE 25 MG PO TABS: 50.0000 mg | ORAL_TABLET | ORAL | Status: DC

## 2023-05-03 MED ORDER — LIDOCAINE HCL (CARDIAC) PF 100 MG/5ML IV SOSY
PREFILLED_SYRINGE | INTRAVENOUS | Status: DC | PRN
  Administered 2023-05-03: 100 mg via INTRAVENOUS

## 2023-05-03 MED ORDER — GABAPENTIN 300 MG PO CAPS
600.0000 mg | ORAL_CAPSULE | Freq: Every day | ORAL | Status: DC
  Administered 2023-05-03: 600 mg via ORAL
  Filled 2023-05-03: qty 2

## 2023-05-03 MED ORDER — OXYCODONE HCL 5 MG PO TABS
5.0000 mg | ORAL_TABLET | Freq: Once | ORAL | Status: DC | PRN
Start: 2023-05-03 — End: 2023-05-03

## 2023-05-03 MED ORDER — PROPOFOL 10 MG/ML IV BOLUS
INTRAVENOUS | Status: DC | PRN
  Administered 2023-05-03: 180 mg via INTRAVENOUS

## 2023-05-03 MED ORDER — 0.9 % SODIUM CHLORIDE (POUR BTL) OPTIME
TOPICAL | Status: DC | PRN
  Administered 2023-05-03: 1000 mL

## 2023-05-03 MED ORDER — FENTANYL CITRATE (PF) 100 MCG/2ML IJ SOLN
INTRAMUSCULAR | Status: DC | PRN
  Administered 2023-05-03 (×4): 50 ug via INTRAVENOUS

## 2023-05-03 MED ORDER — SPIRONOLACTONE 25 MG PO TABS
25.0000 mg | ORAL_TABLET | Freq: Every day | ORAL | Status: DC
  Administered 2023-05-04: 25 mg via ORAL
  Filled 2023-05-03: qty 1

## 2023-05-03 MED ORDER — KETOROLAC TROMETHAMINE 15 MG/ML IJ SOLN
15.0000 mg | Freq: Four times a day (QID) | INTRAMUSCULAR | Status: DC | PRN
  Administered 2023-05-03 – 2023-05-04 (×2): 15 mg via INTRAVENOUS
  Filled 2023-05-03 (×2): qty 1

## 2023-05-03 MED ORDER — SODIUM CHLORIDE 0.9 % IV BOLUS
1000.0000 mL | Freq: Once | INTRAVENOUS | Status: AC
  Administered 2023-05-03: 1000 mL via INTRAVENOUS

## 2023-05-03 MED ORDER — SUGAMMADEX SODIUM 200 MG/2ML IV SOLN
INTRAVENOUS | Status: DC | PRN
  Administered 2023-05-03: 267.4 mg via INTRAVENOUS

## 2023-05-03 MED ORDER — KETOROLAC TROMETHAMINE 15 MG/ML IJ SOLN
15.0000 mg | Freq: Once | INTRAMUSCULAR | Status: AC
  Administered 2023-05-03: 15 mg via INTRAVENOUS
  Filled 2023-05-03: qty 1

## 2023-05-03 MED ORDER — SODIUM CHLORIDE 0.9 % IV SOLN: INTRAVENOUS | Status: DC | PRN

## 2023-05-03 MED ORDER — ONDANSETRON HCL 4 MG/2ML IJ SOLN
INTRAMUSCULAR | Status: AC
  Filled 2023-05-03: qty 2

## 2023-05-03 MED ORDER — ROCURONIUM BROMIDE 100 MG/10ML IV SOLN
INTRAVENOUS | Status: DC | PRN
  Administered 2023-05-03: 50 mg via INTRAVENOUS
  Administered 2023-05-03 (×3): 30 mg via INTRAVENOUS
  Administered 2023-05-03: 20 mg via INTRAVENOUS

## 2023-05-03 MED ORDER — MOMETASONE FURO-FORMOTEROL FUM 200-5 MCG/ACT IN AERO
2.0000 | INHALATION_SPRAY | Freq: Two times a day (BID) | RESPIRATORY_TRACT | Status: DC
  Administered 2023-05-03 – 2023-05-04 (×2): 2 via RESPIRATORY_TRACT
  Filled 2023-05-03 (×2): qty 8.8

## 2023-05-03 MED ORDER — PANTOPRAZOLE SODIUM 40 MG PO TBEC
40.0000 mg | DELAYED_RELEASE_TABLET | Freq: Every morning | ORAL | Status: DC
  Administered 2023-05-04: 40 mg via ORAL
  Filled 2023-05-03: qty 1

## 2023-05-03 MED ORDER — SODIUM CHLORIDE 0.9 % IV SOLN: INTRAVENOUS | Status: DC

## 2023-05-03 SURGICAL SUPPLY — 78 items
BIT DRILL 2 CANN GRADUATED (BIT) IMPLANT
BIT DRILL 2.5 CANN LNG (BIT) IMPLANT
BIT DRILL 2.5 CANN STRL (BIT) IMPLANT
BIT DRILL 2.6 CANN (BIT) IMPLANT
BIT DRILL 2.7 (BIT) ×1
BIT DRILL 2.7X2.7/3XSCR ANKL (BIT) IMPLANT
BIT DRILL 3.5 CANN STRL (BIT) IMPLANT
BIT DRL 2.7X2.7/3XSCR ANKL (BIT) ×1
BLADE SURG 15 STRL LF DISP TIS (BLADE) IMPLANT
BLADE SURG 15 STRL SS (BLADE) ×2
BNDG ELASTIC 4INX 5YD STR LF (GAUZE/BANDAGES/DRESSINGS) ×1 IMPLANT
BNDG ELASTIC 4X5.8 VLCR NS LF (GAUZE/BANDAGES/DRESSINGS) ×1 IMPLANT
BNDG ELASTIC 6X5.8 VLCR NS LF (GAUZE/BANDAGES/DRESSINGS) ×1 IMPLANT
BNDG ESMARCH 4X12 STRL LF (GAUZE/BANDAGES/DRESSINGS) ×1 IMPLANT
CHLORAPREP W/TINT 26 (MISCELLANEOUS) ×1 IMPLANT
CUFF TOURN SGL QUICK 24 (TOURNIQUET CUFF) ×1
CUFF TRNQT CYL 24X4X16.5-23 (TOURNIQUET CUFF) IMPLANT
Cannulated screw IMPLANT
DRAPE C-ARM 42X70 (DRAPES) ×1 IMPLANT
DRAPE C-ARMOR (DRAPES) ×1 IMPLANT
ELECT REM PT RETURN 9FT ADLT (ELECTROSURGICAL) ×1
ELECTRODE REM PT RTRN 9FT ADLT (ELECTROSURGICAL) ×1 IMPLANT
GAUZE SPONGE 4X4 12PLY STRL (GAUZE/BANDAGES/DRESSINGS) IMPLANT
GAUZE XEROFORM 1X8 LF (GAUZE/BANDAGES/DRESSINGS) ×1 IMPLANT
GLOVE PI ORTHO PRO STRL SZ7 (GLOVE) ×1 IMPLANT
GOWN STRL REUS W/ TWL LRG LVL3 (GOWN DISPOSABLE) ×1 IMPLANT
GOWN STRL REUS W/TWL LRG LVL3 (GOWN DISPOSABLE) ×1
GUIDEWIRE 1.35MM (WIRE) IMPLANT
GUIDEWIRE 1.6 (WIRE) ×3
GUIDEWIRE ORTH 157X1.6XTROC (WIRE) IMPLANT
HANDLE YANKAUER SUCT BULB TIP (MISCELLANEOUS) IMPLANT
K-WIRE BB-TAK (WIRE) ×2
KIT TURNOVER KIT A (KITS) ×1 IMPLANT
KWIRE BB-TAK (WIRE) IMPLANT
LABEL OR SOLS (LABEL) ×1 IMPLANT
MANIFOLD NEPTUNE II (INSTRUMENTS) ×1 IMPLANT
NDL HYPO 22X1.5 SAFETY MO (MISCELLANEOUS) ×1 IMPLANT
NEEDLE HYPO 22X1.5 SAFETY MO (MISCELLANEOUS) ×1 IMPLANT
NS IRRIG 500ML POUR BTL (IV SOLUTION) ×1 IMPLANT
PACK EXTREMITY ARMC (MISCELLANEOUS) ×1 IMPLANT
PAD ABD DERMACEA PRESS 5X9 (GAUZE/BANDAGES/DRESSINGS) IMPLANT
PAD PREP OB/GYN DISP 24X41 (PERSONAL CARE ITEMS) ×1 IMPLANT
PADDING CAST BLEND 4X4 NS (MISCELLANEOUS) ×2 IMPLANT
PLATE DISTAL FIB RT 6H (Plate) IMPLANT
PLATE LOCK MED HOOK 3H (Plate) IMPLANT
SCREW CAN THREAD 3X18 (Screw) IMPLANT
SCREW CANCELLOUS 3MM 3X12MM (Screw) IMPLANT
SCREW CANCELLOUS 3X16MM (Screw) IMPLANT
SCREW CANN LP LT 4X50 (Screw) IMPLANT
SCREW COMP KREULOCK 2.7X14 (Screw) IMPLANT
SCREW COMP KREULOCK 2.7X16 (Screw) IMPLANT
SCREW COMP KREULOCK 2.7X18 (Screw) IMPLANT
SCREW COMP KREULOCK 3.5X14 (Screw) IMPLANT
SCREW CORT 2.7X46 (Screw) IMPLANT
SCREW CORT 2.7X50 (Screw) IMPLANT
SCREW CORT 3.5X40 LP ANKLE (Screw) IMPLANT
SCREW CORT FT 3.5X22 (Screw) IMPLANT
SCREW CORTEX 2.7X24MM LP SS (Screw) IMPLANT
SCREW LO PRF TMSS 3.5X55 CORT (Screw) IMPLANT
SCREW LOW PROFILE 3.5X14 (Screw) IMPLANT
SCREW LOW PROFILE 3.5X35 (Screw) IMPLANT
SCREW LP CANN 4.0X45MM (Screw) IMPLANT
SCREW NLOCK CORT 3.5X50 NS (Screw) IMPLANT
SPLINT CAST 1 STEP 4X30 (MISCELLANEOUS) ×2 IMPLANT
SPONGE T-LAP 18X18 ~~LOC~~+RFID (SPONGE) ×1 IMPLANT
STAPLER SKIN PROX 35W (STAPLE) IMPLANT
STOCKINETTE M/LG 89821 (MISCELLANEOUS) ×1 IMPLANT
STOCKINETTE ORTHO 4X25 (MISCELLANEOUS) IMPLANT
STOCKINETTE ORTHO 6X25 (MISCELLANEOUS) IMPLANT
STRAP SAFETY 5IN WIDE (MISCELLANEOUS) ×1 IMPLANT
SUT ETHILON 3-0 FS-10 30 BLK (SUTURE) ×1
SUT MNCRL AB 3-0 PS2 27 (SUTURE) ×1 IMPLANT
SUT VIC AB 3-0 SH 27 (SUTURE) ×3
SUT VIC AB 3-0 SH 27X BRD (SUTURE) IMPLANT
SUTURE EHLN 3-0 FS-10 30 BLK (SUTURE) ×1 IMPLANT
SYR 10ML LL (SYRINGE) ×1 IMPLANT
TRAP FLUID SMOKE EVACUATOR (MISCELLANEOUS) ×1 IMPLANT
WATER STERILE IRR 500ML POUR (IV SOLUTION) ×1 IMPLANT

## 2023-05-03 NOTE — Anesthesia Procedure Notes (Signed)
Procedure Name: Intubation Date/Time: 05/03/2023 10:17 AM  Performed by: Elmarie Mainland, CRNAPre-anesthesia Checklist: Patient identified, Emergency Drugs available, Suction available and Patient being monitored Patient Re-evaluated:Patient Re-evaluated prior to induction Oxygen Delivery Method: Circle system utilized Preoxygenation: Pre-oxygenation with 100% oxygen Induction Type: IV induction Ventilation: Mask ventilation without difficulty Laryngoscope Size: McGrath and 3 Grade View: Grade I Tube type: Oral Tube size: 7.0 mm Number of attempts: 1 Airway Equipment and Method: Stylet and Video-laryngoscopy Placement Confirmation: ETT inserted through vocal cords under direct vision, positive ETCO2 and breath sounds checked- equal and bilateral Secured at: 22 cm Tube secured with: Tape Dental Injury: Teeth and Oropharynx as per pre-operative assessment

## 2023-05-03 NOTE — ED Notes (Signed)
Sent message to Pharmacy about missing inhaler dose.

## 2023-05-03 NOTE — Assessment & Plan Note (Signed)
-   This is secondary to mechanical fall. - The patient will be admitted to a medical-surgical bed. - Pain management will be provided. - Podiatry consult will be obtained. - Dr. Lilian Kapur was notified and is aware about the patient. - Will keep her n.p.o. for now.

## 2023-05-03 NOTE — Transfer of Care (Signed)
Immediate Anesthesia Transfer of Care Note  Patient: Andrea [name]  Procedure(s) Performed: OPEN REDUCTION INTERNAL FIXATION (ORIF) ANKLE FRACTURE (Right: Ankle)  Patient Location: PACU  Anesthesia Type:General  Level of Consciousness: drowsy  Airway & Oxygen Therapy: Patient Spontanous Breathing and Patient connected to face mask oxygen  Post-op Assessment: Report given to RN and Post -op Vital signs reviewed and stable  Post vital signs: Reviewed and stable  Last Vitals:  Vitals Value Taken Time  BP 133/74 05/03/23 1430  Temp 36.6 C 05/03/23 1428  Pulse 103 05/03/23 1434  Resp 14 05/03/23 1434  SpO2 96 % 05/03/23 1434  Vitals shown include unfiled device data.  Last Pain:  Vitals:   05/03/23 0828  TempSrc: Oral  PainSc:          Complications: No notable events documented.

## 2023-05-03 NOTE — Consult Note (Addendum)
Reason for Consult: Right ankle fracture Referring Physician: Dr. Honor Junes is an 42 y.o. female.  HPI: Patient states that around 4 AM she got up out of bed and felt dizzy and fell and rolled her ankle and she felt immediate pain and injured her right ankle with deformity she was brought by EMS to [facility] initially had low blood pressures of 92/65 and 67/37.  X-ray showed a trimalleolar ankle fracture we are consulted for management.  She reports type 2 diabetes with a recent A1c of 6.9%.  Does not smoke.  Injured her left ankle last year and this is healed okay  Past Medical History:  Diagnosis Date   Anxiety    Bipolar 1 disorder (HCC)    Bipolar 1 disorder, mixed, severe (HCC) 12/20/2016   Bipolar I disorder, most recent episode depressed (HCC) 12/20/2016   Complication of anesthesia    hard time waking up    Depression    Diabetes mellitus without complication (HCC)    Drug induced akathisia 04/02/2022   GERD (gastroesophageal reflux disease)    no meds   Kidney stones    Long term current use of antipsychotic medication 03/13/2022   Low back pain    Migraines    PCOS (polycystic ovarian syndrome)    PONV (postoperative nausea and vomiting)    Schizophrenia (HCC)    Seizure (HCC) 10/03/2021   Seizures (HCC) 06/04/2016   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Marilynne Drivers 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   Shortness of breath dyspnea    with bronchitis    Past Surgical History:  Procedure Laterality Date   ABDOMINAL HYSTERECTOMY N/A 11/14/2015   Procedure: HYSTERECTOMY ABDOMINAL;  Surgeon: Levi Aland, MD;  Location: [facility];  Service: Gynecology;  Laterality: N/A;   CHOLECYSTECTOMY     INTRAUTERINE DEVICE (IUD) INSERTION  06/14/2014   [facility]   LYMPH NODES REMOVED     ovarian cyst removed     SALPINGOOPHORECTOMY Bilateral 11/14/2015   Procedure: SALPINGO OOPHORECTOMY;  Surgeon: Levi Aland, MD;  Location: [facility];  Service: Gynecology;  Laterality: Bilateral;     Family History  Problem Relation Age of Onset   Healthy Mother    Healthy Father     Social History:  reports that she has never smoked. She has never used smokeless tobacco. She reports that she does not drink alcohol and does not use drugs.  Allergies:  Allergies  Allergen Reactions   Bactrim [Sulfamethoxazole-Trimethoprim] Hives and Itching   Codeine Hives and Itching    Medications: I have reviewed the patient's current medications.  Results for orders placed or performed during the hospital encounter of 05/03/23 (from the past 48 hour(s))  Comprehensive metabolic panel     Status: Abnormal   Collection Time: 05/03/23  4:20 AM  Result Value Ref Range   Sodium 139 135 - 145 mmol/L   Potassium 4.0 3.5 - 5.1 mmol/L   Chloride 104 98 - 111 mmol/L   CO2 26 22 - 32 mmol/L   Glucose, Bld 146 (H) 70 - 99 mg/dL    Comment: Glucose reference range applies only to samples taken after fasting for at least 8 hours.   BUN 21 (H) 6 - 20 mg/dL   Creatinine, Ser 8.29 0.44 - 1.00 mg/dL   Calcium 9.1 8.9 - 56.2 mg/dL   Total Protein 6.6 6.5 - 8.1 g/dL   Albumin 3.8 3.5 - 5.0 g/dL   AST 17 15 - 41 U/L  ALT 21 0 - 44 U/L   Alkaline Phosphatase 93 38 - 126 U/L   Total Bilirubin 0.8 <1.2 mg/dL   GFR, Estimated >16 >10 mL/min    Comment: (NOTE) Calculated using the CKD-EPI Creatinine Equation (2021)    Anion gap 9 5 - 15    Comment: Performed at [facility], [address] [place], [place] [address]  CBC with Differential     Status: Abnormal   Collection Time: 05/03/23  4:20 AM  Result Value Ref Range   WBC 8.9 4.0 - 10.5 K/uL   RBC 4.23 3.87 - 5.11 MIL/uL   Hemoglobin 12.5 12.0 - 15.0 g/dL   HCT 40.9 81.1 - 91.4 %   MCV 94.6 80.0 - 100.0 fL   MCH 29.6 26.0 - 34.0 pg   MCHC 31.3 30.0 - 36.0 g/dL   RDW 78.2 95.6 - 21.3 %   Platelets 235 150 - 400 K/uL   nRBC 0.0 0.0 - 0.2 %   Neutrophils Relative % 54 %   Neutro Abs 4.9 1.7 - 7.7 K/uL   Lymphocytes Relative 36  %   Lymphs Abs 3.2 0.7 - 4.0 K/uL   Monocytes Relative 6 %   Monocytes Absolute 0.5 0.1 - 1.0 K/uL   Eosinophils Relative 2 %   Eosinophils Absolute 0.2 0.0 - 0.5 K/uL   Basophils Relative 1 %   Basophils Absolute 0.1 0.0 - 0.1 K/uL   Immature Granulocytes 1 %   Abs Immature Granulocytes 0.09 (H) 0.00 - 0.07 K/uL    Comment: Performed at [facility], [address] [place], [place] [address]  Troponin I (High Sensitivity)     Status: None   Collection Time: 05/03/23  4:20 AM  Result Value Ref Range   Troponin I (High Sensitivity) 3 <18 ng/L    Comment: (NOTE) Elevated high sensitivity troponin I (hsTnI) values and significant  changes across serial measurements may suggest ACS but many other  chronic and acute conditions are known to elevate hsTnI results.  Refer to the "Links" section for chest pain algorithms and additional  guidance. Performed at [facility], [address] [place], [place] [address]   CBG monitoring, ED     Status: Abnormal   Collection Time: 05/03/23  7:39 AM  Result Value Ref Range   Glucose-Capillary 131 (H) 70 - 99 mg/dL    Comment: Glucose reference range applies only to samples taken after fasting for at least 8 hours.    CT Ankle Right Wo Contrast  Result Date: 05/03/2023 CLINICAL DATA:  Trimalleolar fracture with posterior dislocation of the ankle. EXAM: CT OF THE RIGHT ANKLE WITHOUT CONTRAST TECHNIQUE: Multidetector CT imaging of the right ankle was performed according to the standard protocol. Multiplanar CT image reconstructions were also generated. RADIATION DOSE REDUCTION: This exam was performed according to the departmental dose-optimization program which includes automated exposure control, adjustment of the mA and/or kV according to patient size and/or use of iterative reconstruction technique. COMPARISON:  Contemporaneous plain films. FINDINGS: Bones/Joint/Cartilage There is a comminuted oblique distal fibular fracture  with the lower fracture margin at the level of the tibial plafond. The main distal fracture fragment is posteriorly displaced up to 1 shaft width, laterally displaced by nearly a shaft's width and with comminution fragments in between fracture margins as well as along the anterior margin of the proximal fragment. Transverse fracture medial malleolus is noted with small comminution in between the fracture margins, with the distal fragment medially displaced  with respect to the parent bone up to the width of the malleolus and displaced from the adjacent talus up to 7 mm. Comminuted posterior malleolar fracture is noted. The main distal fragment distracted posteriorly up to 1 cm with comminution between fracture margins and about 5 mm cephalad displacement. Posterolateral subluxation of the talus and foot together is seen, with preservation of the normal talar dome contour. The posteromedial aspects of the tibial plafond abut the mid talar dome. The distal tibia and fibula above the fracture sites remain together with no significant separation. There is tibiotalar hemarthrosis. Small plantar and dorsal calcaneal spurring is noted and is noninflammatory. There are no further fractures. No evidence of arthropathy or other focal bone abnormality. Ligaments Suboptimally assessed by CT. Muscles and Tendons Normal muscle bulk for age. No intramuscular hematoma. The tendons are not well studied with CT but are grossly intact as well as can be seen, including the peroneal and Achilles tendons. Soft tissues There is moderate edema at the ankle. No subcutaneous hematoma is seen. IMPRESSION: 1. Trimalleolar fracture dislocation of the ankle, with comminution and displacement of all 3 fracture fragments. 2. Posterolateral subluxation and mild apex lateral tilting of the talus and foot together. 3. Tibiotalar hemarthrosis. 4. Moderate soft tissue edema. 5. Calcaneal spurring. Electronically Signed   By: Almira Bar M.D.   On:  05/03/2023 06:41   DG Ankle Complete Right  Result Date: 05/03/2023 CLINICAL DATA:  Fall with ankle deformity EXAM: RIGHT ANKLE - COMPLETE 3 VIEW COMPARISON:  None ipsilateral FINDINGS: Oblique fracture through the distal fibular shaft. Horizontal fracture through the medial malleolus. Posterior dislocation of the ankle with displaced posterior malleolus fracture. No detected talus fracture or hindfoot subluxation. IMPRESSION: Trimalleolar fracture with posterior dislocation of the ankle. Electronically Signed   By: Tiburcio Pea M.D.   On: 05/03/2023 04:55    Review of Systems  Constitutional:  Negative for chills and fever.  Respiratory:  Negative for shortness of breath.   Cardiovascular:  Negative for chest pain, orthopnea and leg swelling.  Gastrointestinal:  Negative for diarrhea, nausea and vomiting.  Genitourinary:  Negative for dysuria.  Musculoskeletal:  Positive for falls and joint pain.  Neurological:  Positive for dizziness.  All other systems reviewed and are negative.  Blood pressure 110/71, pulse 70, temperature 97.9 F (36.6 C), temperature source Oral, resp. rate 13, height 5\' 11"  (1.803 m), weight 133.7 kg, SpO2 95%.  Vitals:   05/03/23 0828 05/03/23 0902  BP: 91/65 110/71  Pulse: 70   Resp: 13   Temp: 97.9 F (36.6 C)   SpO2: 95%     General AA&O x3. Normal mood and affect.  Vascular Dorsalis pedis and posterior tibial pulses  present 2+ right  Capillary refill normal to all digits. Pedal hair growth normal.  Neurologic Epicritic sensation grossly  present there is some distal neuropathy .  No major neurovascular defects  Dermatologic (Wound) No open lacerations or fracture blisters present  Orthopedic: Motor intact BLE.  Able to move toes.  No evidence of compartment syndrome    Assessment/Plan:  Right ankle trimalleolar fracture -Imaging: Studies independently reviewed including plain film radiographs and CT scan -I discussed the severity of the  fracture and need for operative treatment.  Close reduction was not possible in the emergency room due to concern over her blood pressures with conscious propofol sedation, patient preferred not to have ketamine sedation due to possible psychiatric effects.  Recommended urgent ORIF today in operating room.  There  are no fracture blisters or open lacerations and edema is present but not excessive or severe to require external fixation -Antibiotics: Ancef 3 g prophylaxis -WB Status: NWB RLE   Edwin Cap 05/03/2023, 9:41 AM   Best available via secure chat for questions or concerns.

## 2023-05-03 NOTE — ED Notes (Signed)
OR RN in room transporting patient to OR now!

## 2023-05-03 NOTE — Assessment & Plan Note (Signed)
-   The will be placed on supplemental coverage with NovoLog. - We will continue her Jardiance. - Will continue Neurontin.

## 2023-05-03 NOTE — Anesthesia Postprocedure Evaluation (Signed)
Anesthesia Post Note  Patient: Andrea [name]  Procedure(s) Performed: OPEN REDUCTION INTERNAL FIXATION (ORIF) ANKLE FRACTURE (Right: Ankle)  Patient location during evaluation: PACU Anesthesia Type: General Level of consciousness: awake and alert Pain management: pain level controlled Vital Signs Assessment: post-procedure vital signs reviewed and stable Respiratory status: spontaneous breathing, nonlabored ventilation, respiratory function stable and patient connected to nasal cannula oxygen Cardiovascular status: blood pressure returned to baseline and stable Postop Assessment: no apparent nausea or vomiting Anesthetic complications: no   No notable events documented.   Last Vitals:  Vitals:   05/03/23 1500 05/03/23 1545  BP: 131/69   Pulse: 97   Resp: 19   Temp:  36.7 C  SpO2: 94% 92%    Last Pain:  Vitals:   05/03/23 1545  TempSrc:   PainSc: 0-No pain                 Cleda Mccreedy Bionca Mckey

## 2023-05-03 NOTE — ED Notes (Addendum)
Patient clothes and valuables in pt belongings bag. Patient in a hospital gown. Patient is wearing glasses for necessity of seeing until OR transport. Patient has clothes, Glasses, and Cellular phone with them. Consent obtained.

## 2023-05-03 NOTE — ED Notes (Signed)
Waiting on OR team to transport patient to OR.

## 2023-05-03 NOTE — H&P (Signed)
[place]   PATIENT NAME: Andrea [name]    MR#:  [id number]  DATE OF BIRTH:  Jun 03, 1981  DATE OF ADMISSION:  05/03/2023  PRIMARY CARE PHYSICIAN: Golden Pop, FNP   Patient is coming from: Home  REQUESTING/REFERRING PHYSICIAN: Pilar Jarvis, MD  CHIEF COMPLAINT:   Chief Complaint  Patient presents with   Dizziness   Fall   Ankle Injury    HISTORY OF PRESENT ILLNESS:  Andrea [name] is a 42 y.o. female with medical history significant for bipolar 1 disorder, type 2 diabetes mellitus, GERD, migraine, schizophrenia and seizure disorder, who presented to the emergency room with acute onset of right ankle pain and deformity.  She had dizziness and fell with her weight on her right ankle.No presyncope or syncope.  No fever or chills.  No nausea or vomiting or abdominal pain.  No chest pain or palpitations.  No cough or wheezing or hemoptysis.  No dysuria, oliguria or hematuria or flank pain.  ED Course: Upon presentation to the emergency room, BP was 92/63 with otherwise normal vital signs.  Labs revealed a blood glucose of 146 with a BUN of 21 and otherwise unremarkable CMP.  CBC was within normal. EKG as reviewed by me : EKG showed normal sinus rhythm with rate of 63 with low voltage QRS. Imaging: Right ankle x-ray showed trimalleolar fracture with posterior dislocation of the ankle.  The patient was given 15 mg of IV Toradol, 1 L bolus of IV normal saline and 50 mcg of IV fentanyl twice.  Dr. Lilian Kapur was notified about the patient.  She will be admitted to a medical bed for further evaluation and management. PAST MEDICAL HISTORY:   Past Medical History:  Diagnosis Date   Anxiety    Bipolar 1 disorder (HCC)    Bipolar 1 disorder, mixed, severe (HCC) 12/20/2016   Bipolar I disorder, most recent episode depressed (HCC) 12/20/2016   Complication of anesthesia    hard time waking up    Depression    Diabetes mellitus without complication (HCC)    Drug induced  akathisia 04/02/2022   GERD (gastroesophageal reflux disease)    no meds   Kidney stones    Long term current use of antipsychotic medication 03/13/2022   Low back pain    Migraines    PCOS (polycystic ovarian syndrome)    PONV (postoperative nausea and vomiting)    Schizophrenia (HCC)    Seizure (HCC) 10/03/2021   Seizures (HCC) 06/04/2016   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Marilynne Drivers 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   Shortness of breath dyspnea    with bronchitis    PAST SURGICAL HISTORY:   Past Surgical History:  Procedure Laterality Date   ABDOMINAL HYSTERECTOMY N/A 11/14/2015   Procedure: HYSTERECTOMY ABDOMINAL;  Surgeon: Levi Aland, MD;  Location: [facility];  Service: Gynecology;  Laterality: N/A;   CHOLECYSTECTOMY     INTRAUTERINE DEVICE (IUD) INSERTION  06/14/2014   [facility]   LYMPH NODES REMOVED     ovarian cyst removed     SALPINGOOPHORECTOMY Bilateral 11/14/2015   Procedure: SALPINGO OOPHORECTOMY;  Surgeon: Levi Aland, MD;  Location: [facility];  Service: Gynecology;  Laterality: Bilateral;    SOCIAL HISTORY:   Social History   Tobacco Use   Smoking status: Never   Smokeless tobacco: Never  Substance Use Topics   Alcohol use: Never    FAMILY HISTORY:   Family History  Problem Relation Age of Onset   Healthy Mother  Healthy Father     DRUG ALLERGIES:   Allergies  Allergen Reactions   Bactrim [Sulfamethoxazole-Trimethoprim] Hives and Itching   Codeine Hives and Itching    REVIEW OF SYSTEMS:   ROS As per history of present illness. All pertinent systems were reviewed above. Constitutional, HEENT, cardiovascular, respiratory, GI, GU, musculoskeletal, neuro, psychiatric, endocrine, integumentary and hematologic systems were reviewed and are otherwise negative/unremarkable except for positive findings mentioned above in the HPI.   MEDICATIONS AT HOME:   Prior to Admission medications   Medication Sig Start Date End Date Taking? Authorizing Provider   Albuterol-Budesonide Paulene Floor) 90-80 MCG/ACT AERO  04/01/23   [provider]  amoxicillin-clavulanate (AUGMENTIN) 875-125 MG tablet Take 1 tablet by mouth 2 (two) times daily. 04/21/23   Margaretann Loveless, PA-C  Atogepant (QULIPTA) 30 MG TABS Take 1 tablet daily 03/18/23   Van Clines, MD  DULoxetine (CYMBALTA) 60 MG capsule Take 2 capsules (120 mg total) by mouth daily. 03/24/23   Elsie Lincoln, MD  empagliflozin (JARDIANCE) 25 MG TABS tablet Take 25 mg by mouth every morning.    [provider]  fluconazole (DIFLUCAN) 150 MG tablet Take 1 tablet (150 mg total) by mouth every 3 (three) days as needed. 04/21/23   Margaretann Loveless, PA-C  gabapentin (NEURONTIN) 300 MG capsule Take 1 pill each morning, one at noon, and 2 at night. 04/21/23   Elsie Lincoln, MD  Galcanezumab-gnlm (EMGALITY) 120 MG/ML SOAJ Inject 1 Pen into the skin every 30 (thirty) days. 01/13/23   Van Clines, MD  GEMTESA 75 MG TABS Take 1 tablet by mouth daily. 01/02/23   [provider]  levocetirizine (XYZAL) 5 MG tablet Take 5 mg by mouth at bedtime.    [provider]  nabumetone (RELAFEN) 750 MG tablet Take 750 mg by mouth 2 (two) times daily. 12/10/22   [provider]  ondansetron (ZOFRAN-ODT) 4 MG disintegrating tablet Take 1 tablet (4 mg total) by mouth every 8 (eight) hours as needed for nausea or vomiting. 04/23/22   Van Clines, MD  pantoprazole (PROTONIX) 40 MG tablet Take 40 mg by mouth every morning. 08/31/20   [provider]  potassium chloride (MICRO-K) 10 MEQ CR capsule Take 10 mEq by mouth daily. 10/09/22   [provider]  prazosin (MINIPRESS) 2 MG capsule Take 2 capsules (4 mg total) by mouth at bedtime. 03/24/23   Elsie Lincoln, MD  QUEtiapine (SEROQUEL) 50 MG tablet Take one tablet in the morning, one at midday, 2 at night. 04/21/23   Elsie Lincoln, MD  RYBELSUS 14 MG TABS Take 1 tablet by mouth daily. 02/27/23   [provider]  SINGULAIR 10 MG tablet Take 10 mg by mouth daily. 08/28/22   [provider]  spironolactone (ALDACTONE) 25 MG tablet Take 25 mg by mouth daily. 10/08/22   [provider]  SUMAtriptan (IMITREX) 100 MG tablet Take 1 tablet at onset of migraine. May May repeat in 2 hours if headache persists or recurs. Do not take more than 3 a week 12/06/22   Van Clines, MD  SYMBICORT 160-4.5 MCG/ACT inhaler Inhale 2 puffs into the lungs. 08/13/22   [provider]  tiZANidine (ZANAFLEX) 4 MG tablet Take 4 mg by mouth every 8 (eight) hours as needed for muscle spasms.    [provider]  traZODone (DESYREL) 150 MG tablet Take 150 mg by mouth at bedtime. 12/13/22   [provider]  zonisamide (ZONEGRAN) 100 MG capsule Take 5 capsules every night 12/06/22   Van Clines, MD      VITAL SIGNS:  Blood pressure 110/71, pulse 70, temperature 97.9 F (36.6 C), temperature source Oral, resp. rate 13, height 5\' 11"  (1.803 m), weight 133.7 kg, SpO2 95%.  PHYSICAL EXAMINATION:  Physical Exam  GENERAL:  42 y.o.-year-old patient lying in the bed with no acute distress.  EYES: Pupils equal, round, reactive to light and accommodation. No scleral icterus. Extraocular muscles intact.  HEENT: Head atraumatic, normocephalic. Oropharynx and nasopharynx clear.  NECK:  Supple, no jugular venous distention. No thyroid enlargement, no tenderness.  LUNGS: Normal breath sounds bilaterally, no wheezing, rales,rhonchi or crepitation. No use of accessory muscles of respiration.  CARDIOVASCULAR: Regular rate and rhythm, S1, S2 normal. No murmurs, rubs, or gallops.  ABDOMEN: Soft, nondistended, nontender. Bowel sounds present. No organomegaly or mass.  EXTREMITIES/musculoskeletal: Right ankle swelling and deformity.  No cyanosis, or clubbing.  NEUROLOGIC: Cranial nerves II through XII are intact. Muscle strength 5/5 in all extremities. Sensation intact. Gait not checked.   PSYCHIATRIC: The patient is alert and oriented x 3.  Normal affect and good eye contact. SKIN: No obvious rash, lesion, or ulcer.   LABORATORY PANEL:   CBC Recent Labs  Lab 05/03/23 0420  WBC 8.9  HGB 12.5  HCT 40.0  PLT 235   ------------------------------------------------------------------------------------------------------------------  Chemistries  Recent Labs  Lab 05/03/23 0420  NA 139  K 4.0  CL 104  CO2 26  GLUCOSE 146*  BUN 21*  CREATININE 0.89  CALCIUM 9.1  AST 17  ALT 21  ALKPHOS 93  BILITOT 0.8   ------------------------------------------------------------------------------------------------------------------  Cardiac Enzymes No results for input(s): "TROPONINI" in the last 168 hours. ------------------------------------------------------------------------------------------------------------------  RADIOLOGY:  CT Ankle Right Wo Contrast  Result Date: 05/03/2023 CLINICAL DATA:  Trimalleolar fracture with posterior dislocation of the ankle. EXAM: CT OF THE RIGHT ANKLE WITHOUT CONTRAST TECHNIQUE: Multidetector CT imaging of the right ankle was performed according to the standard protocol. Multiplanar CT image reconstructions were also generated. RADIATION DOSE REDUCTION: This exam was performed according to the departmental dose-optimization program which includes automated exposure control, adjustment of the mA and/or kV according to patient size and/or use of iterative reconstruction technique. COMPARISON:  Contemporaneous plain films. FINDINGS: Bones/Joint/Cartilage There is a comminuted oblique distal fibular fracture with the lower fracture margin at the level of the tibial plafond. The main distal fracture fragment is posteriorly displaced up to 1 shaft width, laterally displaced by nearly a shaft's width and with comminution fragments in between fracture margins as well as along the anterior margin of the proximal fragment. Transverse fracture medial  malleolus is noted with small comminution in between the fracture margins, with the distal fragment medially displaced with respect to the parent bone up to the width of the malleolus and displaced from the adjacent talus up to 7 mm. Comminuted posterior malleolar fracture is noted. The main distal fragment distracted posteriorly up to 1 cm with comminution between fracture margins and about 5 mm cephalad displacement. Posterolateral subluxation of the talus and foot together is seen, with preservation of the normal talar dome contour. The posteromedial aspects of the tibial plafond abut the mid talar dome. The distal tibia and fibula above the fracture sites remain together with no significant separation. There is tibiotalar hemarthrosis. Small plantar and dorsal calcaneal spurring is noted and is noninflammatory. There are no further fractures. No evidence of arthropathy or other focal bone abnormality. Ligaments Suboptimally  assessed by CT. Muscles and Tendons Normal muscle bulk for age. No intramuscular hematoma. The tendons are not well studied with CT but are grossly intact as well as can be seen, including the peroneal and Achilles tendons. Soft tissues There is moderate edema at the ankle. No subcutaneous hematoma is seen. IMPRESSION: 1. Trimalleolar fracture dislocation of the ankle, with comminution and displacement of all 3 fracture fragments. 2. Posterolateral subluxation and mild apex lateral tilting of the talus and foot together. 3. Tibiotalar hemarthrosis. 4. Moderate soft tissue edema. 5. Calcaneal spurring. Electronically Signed   By: Almira Bar M.D.   On: 05/03/2023 06:41   DG Ankle Complete Right  Result Date: 05/03/2023 CLINICAL DATA:  Fall with ankle deformity EXAM: RIGHT ANKLE - COMPLETE 3 VIEW COMPARISON:  None ipsilateral FINDINGS: Oblique fracture through the distal fibular shaft. Horizontal fracture through the medial malleolus. Posterior dislocation of the ankle with displaced  posterior malleolus fracture. No detected talus fracture or hindfoot subluxation. IMPRESSION: Trimalleolar fracture with posterior dislocation of the ankle. Electronically Signed   By: Tiburcio Pea M.D.   On: 05/03/2023 04:55      IMPRESSION AND PLAN:  Assessment and Plan: * Closed right ankle fracture - This is secondary to mechanical fall. - The patient will be admitted to a medical-surgical bed. - Pain management will be provided. - Podiatry consult will be obtained. - Dr. Lilian Kapur was notified and is aware about the patient. - Will keep her n.p.o. for now.  Type 2 diabetes mellitus with peripheral neuropathy (HCC) - The will be placed on supplemental coverage with NovoLog. - We will continue her Jardiance. - Will continue Neurontin.  Mild persistent asthma - We will continue her inhalers and Singulair.  Essential hypertension - We will continue antihypertensive therapy.  Bipolar 1 disorder (HCC) - This is associated with schizophrenia. - We will continue her mood stabilizers and psychotropic medications.       DVT prophylaxis: SCDs Advanced Care Planning:  Code Status: full code.  Family Communication:  The plan of care was discussed in details with the patient (and family). I answered all questions. The patient agreed to proceed with the above mentioned plan. Further management will depend upon hospital course. Disposition Plan: Back to previous home environment Consults called: Podiatry All the records are reviewed and case discussed with ED provider.  Status is: Inpatient    At the time of the admission, it appears that the appropriate admission status for this patient is inpatient.  This is judged to be reasonable and necessary in order to provide the required intensity of service to ensure the patient's safety given the presenting symptoms, physical exam findings and initial radiographic and laboratory data in the context of comorbid conditions.  The patient  requires inpatient status due to high intensity of service, high risk of further deterioration and high frequency of surveillance required.  I certify that at the time of admission, it is my clinical judgment that the patient will require inpatient hospital care extending more than 2 midnights.                            Dispo: The patient is from: Home              Anticipated d/c is to: Home              Patient currently is not medically stable to d/c.  Difficult to place patient: No  Hannah Beat M.D on 05/03/2023 at 9:31 AM  [facility]   From 7 PM-7 AM, contact night-coverage [email or web address]  CC: Primary care physician; Golden Pop, FNP

## 2023-05-03 NOTE — ED Notes (Signed)
Called [facility] and informed them of patient transporting to OR.

## 2023-05-03 NOTE — Assessment & Plan Note (Deleted)
-   The will be placed on supplemental coverage with NovoLog. - We will continue her Jardiance.

## 2023-05-03 NOTE — Op Note (Signed)
Patient Name: Andrea [name] DOB: 1980-08-05  MRN: [id number]   Date of Service: 05/03/2023  Surgeon: Dr. Sharl Ma, DPM Assistants: Dr. Gala Lewandowsky, D.P.M. Pre-operative Diagnosis:  Right ankle fracture Post-operative Diagnosis:  S/P reduction of right ankle fracture Procedures: Open reduction and internal fixation of right trimalleolar ankle fracture with fixation of posterior malleolar fracture Pathology/Specimens: * No specimens in log * Anesthesia: None Hemostasis:  Total Tourniquet Time Documented: Thigh (Right) - 107 minutes Thigh (Right) - 66 minutes Total: Thigh (Right) - 173 minutes  Estimated Blood Loss: 30 mL Materials:  Implant Name Type Inv. Item Serial No. Manufacturer Lot No. LRB No. Used Action  SCREW CORT FT 3.5X22 - [id number] Screw SCREW CORT FT 3.5X22  [employer]  Right 1 Implanted and Explanted  PLATE DISTAL FIB RT 6H - [id number] Plate PLATE DISTAL FIB RT 6H  [employer]  Right 1 Implanted  SCREW CANCELLOUS 3X12MM - [id number] Screw SCREW CANCELLOUS 3X12MM  [employer]  Right 1 Implanted  SCREW CANCELLOUS 3X16MM - [id number] Screw SCREW CANCELLOUS 3X16MM  [employer]  Right 1 Implanted  SCREW COMP KREULOCK 2.7X16 - [id number] Screw SCREW COMP KREULOCK 2.7X16  [employer]  Right 3 Implanted  SCREW COMP KREULOCK 2.7X18 - [id number] Screw SCREW COMP KREULOCK 2.7X18  [employer]  Right 1 Implanted and Explanted  SCREW CAN THREAD 3X18 - [id number] Screw SCREW CAN THREAD 3X18  [employer]  Right 1 Implanted  SCREW CORTEX 2.7X24MM LP SS - [id number] Screw SCREW CORTEX 2.7X24MM LP SS  [employer]  Right 1 Implanted  SCREW COMP KREULOCK 3.5X14 - [id number] Screw SCREW COMP KREULOCK 3.5X14  [employer]  Right 2 Implanted  SCREW LOW PROFILE 3.5X14 - [id number] Screw SCREW LOW PROFILE 3.5X14  [employer]  Right 1 Implanted  SCREW LO PRF TMSS 3.5X55 CORT - [id number] Screw SCREW LO PRF TMSS 3.5X55 CORT  [employer]  Right 1 Implanted  SCREW NLOCK CORT  3.5X50 NS - [id number] Screw SCREW NLOCK CORT 3.5X50 NS  [employer]  Right 1 Implanted  SCREW CORTEX 2.7X24MM LP SS - [id number] Screw SCREW CORTEX 2.7X24MM LP SS  [employer]  Right 1 Implanted and Explanted  SCREW CANN LP LT 4X50 - [id number] Screw SCREW CANN LP LT 4X50  [employer]  Right 1 Implanted and Explanted  Cannulated screw      Right 1 Implanted  SCREW COMP KREULOCK 2.7X14 - [id number] Screw SCREW COMP KREULOCK 2.7X14  [employer]  Right 1 Implanted  SCREW LOW PROFILE 3.5X35 - [id number] Screw SCREW LOW PROFILE 3.5X35  [employer]  Right 1 Implanted  SCREW CORT 2.7X50 - [id number] Screw SCREW CORT 2.7X50  [employer]  Right 1 Implanted   Medications: 10 cc each of 1% lidocaine and 0.25% Marcaine plain Complications: No complication noted  Indications for Procedure:  This is a 42 y.o. female with a history of controlled type 2 diabetes with polyneuropathy who sustained a mechanical fall after becoming dizzy and passing out at home this morning.  EMS brought patient to the emergency room and radiographs revealed a trimalleolar ankle fracture.  CT scan revealed severe comminution of each of the malleolar fragments.  ORIF was recommended.   Procedure in Detail: Patient was identified in pre-operative holding area. Formal consent was signed and the right lower extremity was marked. Patient was brought back to the operating room. Anesthesia was induced.  A common peroneal and proximal ankle block was performed with 10 cc each of 1% lidocaine  and 0.25% Marcaine plain in a 50-50 mixture.  The extremity was prepped and draped in the usual sterile fashion. Timeout was taken to confirm patient name, laterality, and procedure prior to incision.   Attention was then directed to the lateral fibula where a linear incision was made along the length of the fibula.  Dissection was carried deep to the deep fascia.  The incision of the deep fascia revealed a large hematoma which was evacuated.  There is no  active pulsatile bleeding.  The fracture plane was identified and debrided of intercalary fragment and hematoma.  Traction was placed on the ankle and the distal fibula was reduced.  The had a large comminuted Wagstaff fracture as well.  The main body of the lateral malleolus was reduced to the proximal fibula with a clamp.  Her bone quality was fairly soft I attempted to place an anterior superior to posterior inferior interfragmentary screw, this had poor purchase and caused a small stress riser into the area of the reduction clamp.  The screw was removed and reduction was maintained with a clamp and an anatomic plate was placed in the lateral fibula.  It was fixated distally with locking and nonlocking cancellous screws. Due to her neuropathy I elected to fixate the syndesmosis with 2 quadri-cortical screws for additional stability of the ankle joint, this aided in reduction of the distal fibula and translation of the talus medially under the tibia.  The proximal plate was then secured with locking and nonlocking screws.  I attempted to place a cortical screw to repair the W[place] fracture however due to the level of severe comminution this was not possible.  The fragments were manually placed into position and the ankle capsule and deep fascia was repaired and closed over the fragments.  This provided good stability and reduction on fluoroscopy of the fibular fracture.  I then direct my attention to the posterior malleolar fracture.  With reduction of the fibula the posterior malleolar fracture had largely reduced with ligamentotaxis.  A reduction clamp was placed onto the posterior malleolar fracture and to the anterior tibia.  A guidewire was placed for 4.0 mm partially-threaded cannulated screw.  The screw was measured drilled and inserted with good compression and stability and further reduction of the posterior malleolar fracture.  We then directed our attention medially.  The medial malleolus was  severely comminuted and exhibited a primary fracture through the anterior colliculus a comminuted posterior collicular fracture that extended posteromedial and had trapping of the PT tendon between the fracture and the tibia.  A third fracture fragment was identified in the anterior portion of the anterior colliculus superiorly.  We began by mobilizing the fracture fragments and freeing the PT tendon from its sheath in order to reduce the posterior medial fracture fragment.  Care was taken not to violate the deltoid ligament.  Once the fracture fragments were mobilized they were manually reduced back into position and temporally fixated with Kirschner wires.  Once this was completed a 2 pronged hook plate was used to secure the main body of the anterior collicular fracture, the hooks were tamped into the distal fragment and the plate was secured with a plate tack proximally.  The oblong compression slot was used with a nonlocking screw which provided compression of the plate to the tibia and compression of the distal fracture fragment proximally and medially to reduce the colliculus.  This was then secured with further locking and nonlocking screws.  Majority of the screws were placed  bicortical for additional stability and fixation in this neuropathic ankle fracture.  Reduction of the anterior colliculus aided in reduction of the posterior collicular fracture through ligamentotaxis.  The anterior superior fragment on the anterior colliculus was still displaced.  A cortical screw was used to directly reduce this utilizing standard AO technique for interfragmentary compression.  This reduced this fracture well and provided good stability.  Final radiographs were then taken.  All incisions were thoroughly irrigated.  Was closed in layers with 3-0 Vicryl, 3-0 Monocryl, 3-0 nylon and skin staples.  The foot was then dressed with Xeroform dry sterile dressings and 1 layer compression dressing and a posterior and  sugar-tong fiberglass splint.. Patient tolerated the procedure well.  Dr. Logan Bores served as my assistant throughout the entirety of the procedure and his presence was critical to a successful surgical outcome.   Disposition: Following a period of post-operative monitoring, patient will be transferred to the floor.  Physical therapy has been ordered.  She should remain nonweightbearing and elevate the extremity and place ice behind the knee to aid in reduction of edema.  She will follow-up me as an outpatient after discharge.

## 2023-05-03 NOTE — Assessment & Plan Note (Signed)
-   We will continue her inhalers and Singulair.

## 2023-05-03 NOTE — ED Provider Notes (Signed)
[facility] Provider Note    Event Date/Time   First MD Initiated Contact with Patient 05/03/23 0403     (approximate)   History   Dizziness, Fall, and Ankle Injury   HPI  Andrea [name] is a 42 y.o. female   Past medical history of prior hypertension no longer on antihypertensives due to normalization of blood pressure, bipolar and schizophrenia, who presents with a fall last night.  She awoke from bed after her husband got home from work around 3 AM stood up to get something from the closet felt lightheaded fell backwards onto her ankle and sustained a right ankle injury.  Deformity.  Denies head strike loss of consciousness.  Otherwise has been in regular state of health.  No longer dizzy.  Denies any chest pain, shortness of breath palpitations during this incident.  Independent Historian contributed to assessment above: Husband at bedside corroborates information given above    Physical Exam   Triage Vital Signs: ED Triage Vitals  Encounter Vitals Group     BP 05/03/23 0407 92/63     Systolic BP Percentile --      Diastolic BP Percentile --      Pulse Rate 05/03/23 0407 61     Resp 05/03/23 0407 16     Temp 05/03/23 0407 97.8 F (36.6 C)     Temp Source 05/03/23 0407 Oral     SpO2 05/03/23 0407 95 %     Weight 05/03/23 0409 294 lb 11.2 oz (133.7 kg)     Height 05/03/23 0409 5\' 11"  (1.803 m)     Head Circumference --      Peak Flow --      Pain Score 05/03/23 0408 10     Pain Loc --      Pain Education --      Exclude from Growth Chart --     Most recent vital signs: Vitals:   05/03/23 0600 05/03/23 0635  BP: 106/68 (!) 86/57  Pulse:  63  Resp: 14 13  Temp:    SpO2:  94%    General: Awake, no distress.  CV:  Good peripheral perfusion.  Resp:  Normal effort. Abd:  No distention.  Other:  Awake alert comfortable appearing with blood pressure low, as low as 70 systolic for EMS arrived 80s over 50s.  Oriented.  Deformity  obvious to the right ankle neurovascular intact.  Head to toe evaluation shows no other significant signs of trauma.   ED Results / Procedures / Treatments   Labs (all labs ordered are listed, but only abnormal results are displayed) Labs Reviewed  COMPREHENSIVE METABOLIC PANEL - Abnormal; Notable for the following components:      Result Value   Glucose, Bld 146 (*)    BUN 21 (*)    All other components within normal limits  CBC WITH DIFFERENTIAL/PLATELET - Abnormal; Notable for the following components:   Abs Immature Granulocytes 0.09 (*)    All other components within normal limits  HEMOGLOBIN A1C  HIV ANTIBODY (ROUTINE TESTING W REFLEX)  TROPONIN I (HIGH SENSITIVITY)     I ordered and reviewed the above labs they are notable for cell counts electrolytes largely unremarkable  EKG  ED ECG REPORT I, Pilar Jarvis, the attending physician, personally viewed and interpreted this ECG.   Date: 05/03/2023  EKG Time: 0409  Rate: 63  Rhythm: sinus  Axis: nl  Intervals:none  ST&T Change:  no stemi  RADIOLOGY I independently reviewed and interpreted ankle x-ray and see right ankle fracture I also reviewed radiologist's formal read.   PROCEDURES:  Critical Care performed: No  Procedures   MEDICATIONS ORDERED IN ED: Medications  SUMAtriptan (IMITREX) tablet 100 mg (has no administration in time range)  prazosin (MINIPRESS) capsule 4 mg (has no administration in time range)  spironolactone (ALDACTONE) tablet 25 mg (has no administration in time range)  DULoxetine (CYMBALTA) DR capsule 120 mg (has no administration in time range)  Semaglutide TABS 14 mg (has no administration in time range)  empagliflozin (JARDIANCE) tablet 25 mg (has no administration in time range)  pantoprazole (PROTONIX) EC tablet 40 mg (has no administration in time range)  mirabegron ER (MYRBETRIQ) tablet 25 mg (has no administration in time range)  zonisamide (ZONEGRAN) capsule 500 mg (has no  administration in time range)  montelukast (SINGULAIR) tablet 10 mg (has no administration in time range)  mometasone-formoterol (DULERA) 200-5 MCG/ACT inhaler 2 puff (has no administration in time range)  0.9 %  sodium chloride infusion ( Intravenous New Bag/Given 05/03/23 0636)  acetaminophen (TYLENOL) tablet 650 mg (has no administration in time range)    Or  acetaminophen (TYLENOL) suppository 650 mg (has no administration in time range)  ketorolac (TORADOL) 15 MG/ML injection 15 mg (has no administration in time range)  traZODone (DESYREL) tablet 25 mg (has no administration in time range)  magnesium hydroxide (MILK OF MAGNESIA) suspension 30 mL (has no administration in time range)  ondansetron (ZOFRAN) tablet 4 mg (has no administration in time range)    Or  ondansetron (ZOFRAN) injection 4 mg (has no administration in time range)  morphine (PF) 2 MG/ML injection 2 mg (has no administration in time range)  insulin aspart (novoLOG) injection 0-15 Units (has no administration in time range)  insulin aspart (novoLOG) injection 0-5 Units (has no administration in time range)  QUEtiapine (SEROQUEL) tablet 25 mg (has no administration in time range)  QUEtiapine (SEROQUEL) tablet 50 mg (has no administration in time range)  gabapentin (NEURONTIN) capsule 300 mg (has no administration in time range)  gabapentin (NEURONTIN) capsule 600 mg (has no administration in time range)  loratadine (CLARITIN) tablet 10 mg (has no administration in time range)  sodium chloride 0.9 % bolus 1,000 mL (0 mLs Intravenous Stopped 05/03/23 0636)  ketorolac (TORADOL) 15 MG/ML injection 15 mg (15 mg Intravenous Given 05/03/23 0419)  fentaNYL (SUBLIMAZE) injection 50 mcg (50 mcg Intravenous Given 05/03/23 0420)  fentaNYL (SUBLIMAZE) injection 50 mcg (50 mcg Intravenous Given 05/03/23 0517)    External physician / consultants:  I spoke with Dr. Lilian Kapur podiatry regarding care plan for this patient.    IMPRESSION / MDM / ASSESSMENT AND PLAN / ED COURSE  I reviewed the triage vital signs and the nursing notes.                                Patient's presentation is most consistent with acute presentation with potential threat to life or bodily function.  Differential diagnosis includes, but is not limited to, ankle fracture, dislocation, neurovascular injury, orthostatic hypotension, vasovagal reaction, dysrhythmia, considered but less likely infection   The patient is on the cardiac monitor to evaluate for evidence of arrhythmia and/or significant heart rate changes.  MDM:    The patient with a fall resulting in trimalleolar ankle fracture with dislocation but will forego reduction in the emergency department because I hesitate to sit  hit her with her low blood pressure.  I am unsure what her baseline blood pressure is and she is unsure but noted that her blood pressure had improved so much so that she was taken off of all antihypertensives.  She is otherwise been in her regular state of health and had a transient episode of dizziness when she stood up from sleeping, which has resolved now.  She has no infectious symptoms on review of systems so I doubt this is a low blood pressure due to sepsis or other medical reason.  Improved slightly with fluids.  Admission to hospitalist service with podiatry consulting for injury.        FINAL CLINICAL IMPRESSION(S) / ED DIAGNOSES   Final diagnoses:  Fall, initial encounter  Pre-syncope  Closed trimalleolar fracture of right ankle, initial encounter     Rx / DC Orders   ED Discharge Orders     None        Note:  This document was prepared using Dragon voice recognition software and may include unintentional dictation errors.    Pilar Jarvis, MD 05/03/23 [phone]

## 2023-05-03 NOTE — Hospital Course (Signed)
HPI: Andrea [name] is a 42 y.o. female  w/ PMH prior hypertension no longer on antihypertensives due to normalization of blood pressure, bipolar and schizophrenia, diabetes, who presents with a fall and ankle injury. She awoke from bed after her husband got home from work around 3 AM stood up to get something from the closet felt lightheaded fell backwards onto her ankle and sustained a right ankle injury.  Hospital course / significant events:  11/16: admitted to hospitalist w/ trimalleolar ankle fracture, podiatry consulted  Consultants:  Podiatry   Procedures/Surgeries: [05/03/23 PENDING ORIF R ankle - Dr Lilian Kapur w/ podiatry]      ASSESSMENT & PLAN:       *** based on BMI: Body mass index is 41.1 kg/m.  Underweight - under 18.5  normal weight - 18.5 to 24.9 overweight - 25 to 29.9 obese - 30 or more   DVT prophylaxis: *** IV fluids: *** continuous IV fluids  Nutrition: *** Central lines / invasive devices: ***  Code Status: *** ACP documentation reviewed: *** none on file in [facility]  TOC needs: *** Barriers to dispo / significant pending items: ***

## 2023-05-03 NOTE — Assessment & Plan Note (Signed)
-   This is associated with schizophrenia. - We will continue her mood stabilizers and psychotropic medications.

## 2023-05-03 NOTE — Progress Notes (Signed)
  Brief Progress Note (See full H&P from earlier today)   Subjective: Pt resting in PACU, drowsy but rousable, no concerns at this time        HPI: Andrea [name] is a 42 y.o. female  w/ PMH prior hypertension no longer on antihypertensives due to normalization of blood pressure, bipolar and schizophrenia, diabetes, who presents with a fall and ankle injury. She awoke from bed after her husband got home from work around 3 AM stood up to get something from the closet felt lightheaded fell backwards onto her ankle and sustained a right ankle injury.  Hospital course / significant events:  11/16: admitted to hospitalist w/ trimalleolar ankle fracture, podiatry consulted  Consultants:  Podiatry   Procedures/Surgeries: 05/03/23 ORIF R ankle - Dr Lilian Kapur w/ podiatry      ASSESSMENT & PLAN:   no significant change from H&P   Closed right ankle fracture secondary to mechanical fall. S/p repair today 05/03/23  Pain control PT/OT per podiatry Discharge readiness per podiatry    Type 2 diabetes mellitus with peripheral neuropathy (HCC) SSI Home Jardiance and Neurontin.   Mild persistent asthma Home inhalers and Singulair.   Essential hypertension Home prazosin, spironolactone    Bipolar 1 disorder (HCC) associated with schizophrenia. Home duloxetine, prazosin, quetiapine, trazodone, zonisamide

## 2023-05-03 NOTE — Anesthesia Preprocedure Evaluation (Addendum)
Anesthesia Evaluation  Patient identified by MRN, date of birth, ID band Patient awake    Reviewed: Allergy & Precautions, NPO status , Patient's Chart, lab work & pertinent test results  History of Anesthesia Complications (+) PONV and history of anesthetic complications  Airway Mallampati: III  TM Distance: <3 FB Neck ROM: full    Dental  (+) Chipped, Poor Dentition, Missing   Pulmonary neg shortness of breath, asthma    Pulmonary exam normal        Cardiovascular Exercise Tolerance: Good hypertension, (-) angina (-) Past MI and (-) DOE Normal cardiovascular exam     Neuro/Psych  Headaches, Seizures -,  PSYCHIATRIC DISORDERS       Neuromuscular disease    GI/Hepatic Neg liver ROS,GERD  Controlled,,  Endo/Other  diabetes    Renal/GU Renal disease     Musculoskeletal   Abdominal   Peds  Hematology negative hematology ROS (+)   Anesthesia Other Findings Hoarse quiet voice at baseline  Past Medical History: No date: Anxiety No date: Bipolar 1 disorder (HCC) 12/20/2016: Bipolar 1 disorder, mixed, severe (HCC) 12/20/2016: Bipolar I disorder, most recent episode depressed (HCC) No date: Complication of anesthesia     Comment:  hard time waking up  No date: Depression No date: Diabetes mellitus without complication (HCC) 04/02/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 No date: GERD (gastroesophageal reflux disease)     Comment:  no meds No date: Kidney stones 03/13/2022: Long term current use of antipsychotic medication No date: Low back pain No date: Migraines No date: PCOS (polycystic ovarian syndrome) No date: PONV (postoperative nausea and vomiting) No date: Schizophrenia (HCC) 10/03/2021: Seizure (HCC) 06/04/2016: Seizures (HCC)     Comment:  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Marilynne Drivers 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 No date: Shortness of breath dyspnea     Comment:  with bronchitis  Past Surgical History: 11/14/2015: ABDOMINAL  HYSTERECTOMY; N/A     Comment:  Procedure: HYSTERECTOMY ABDOMINAL;  Surgeon: Levi Aland, MD;  Location: [facility];  Service: Gynecology;                Laterality: N/A; No date: CHOLECYSTECTOMY 06/14/2014: INTRAUTERINE DEVICE (IUD) INSERTION     Comment:  [facility] No date: LYMPH NODES REMOVED No date: ovarian cyst removed 11/14/2015: SALPINGOOPHORECTOMY; Bilateral     Comment:  Procedure: SALPINGO OOPHORECTOMY;  Surgeon: Levi Aland, MD;  Location: [facility];  Service: Gynecology;                Laterality: Bilateral;  BMI    Body Mass Index: 41.10 kg/m      Reproductive/Obstetrics negative OB ROS                             Anesthesia Physical Anesthesia Plan  ASA: 3  Anesthesia Plan: General ETT   Post-op Pain Management: Regional block*   Induction: Intravenous  PONV Risk Score and Plan: Ondansetron, Dexamethasone, Midazolam and Treatment may vary due to age or medical condition  Airway Management Planned: Oral ETT and Video Laryngoscope Planned  Additional Equipment:   Intra-op Plan:   Post-operative Plan: Extubation in OR  Informed Consent: I have reviewed the patients History and Physical, chart, labs and discussed the procedure including the risks, benefits and alternatives for the proposed anesthesia with the patient  or authorized representative who has indicated his/her understanding and acceptance.     Dental Advisory Given  Plan Discussed with: Anesthesiologist, CRNA and Surgeon  Anesthesia Plan Comments: (Consented for post Op PNB PRN  Patient consented for risks of anesthesia including but not limited to:  - adverse reactions to medications - damage to eyes, teeth, lips or other oral mucosa - nerve damage due to positioning  - sore throat or hoarseness - Damage to heart, brain, nerves, lungs, other parts of body or loss of life  Patient voiced understanding and assent.)        Anesthesia Quick Evaluation

## 2023-05-03 NOTE — Assessment & Plan Note (Signed)
-   We will continue anti-hypertensive therapy. 

## 2023-05-03 NOTE — Brief Op Note (Signed)
05/03/2023  2:25 PM  PATIENT:  Andrea [name]  42 y.o. female  PRE-OPERATIVE DIAGNOSIS:  Right ankle fracture  POST-OPERATIVE DIAGNOSIS:  S/P reduction of right ankle fracture  PROCEDURE:  Procedure(s): OPEN REDUCTION INTERNAL FIXATION (ORIF) ANKLE FRACTURE (Right)Trimalleolar with posterior lip fixation  SURGEON:  Surgeons and Role:    * Aasir Daigler, Rachelle Hora, DPM - Primary    * Felecia Shelling, DPM - Assisting    ANESTHESIA:   general  EBL:  30 mL   BLOOD ADMINISTERED:none  DRAINS: none   LOCAL MEDICATIONS USED:  BUPIVICAINE , LIDOCAINE , and Amount: 10 ml each common peroneal and proximal ankle block   SPECIMEN:  No Specimen  DISPOSITION OF SPECIMEN:  N/A  COUNTS:  YES  TOURNIQUET:   Total Tourniquet Time Documented: Thigh (Right) - 107 minutes Thigh (Right) - 66 minutes Total: Thigh (Right) - 173 minutes   DICTATION: .Note written in EPIC  PLAN OF CARE: Admit to inpatient   PATIENT DISPOSITION:  PACU - hemodynamically stable.   Delay start of Pharmacological VTE agent (>24hrs) due to surgical blood loss or risk of bleeding: yes   -NWB RLE -Ice and elevate -PT ordered -Will need home DVT ppx, Xarelto or Eliquis preferable. OK to start tomorrow pending AM eval -Outpatient follow up will be arranged

## 2023-05-03 NOTE — ED Triage Notes (Signed)
Patient became dizzy at home, and fell.  She denies LOC or hitting her head, and denies thinners.  Patient rolled her right ankle in the process.  Obvious deformity to that ankle.  EMS reports initial BP 92/65 on scene, and then it went to 67/37.  CBG 148.  She was given 50 mcg fentanyl.

## 2023-05-04 DIAGNOSIS — S82891A Other fracture of right lower leg, initial encounter for closed fracture: Secondary | ICD-10-CM | POA: Diagnosis not present

## 2023-05-04 LAB — BASIC METABOLIC PANEL
Anion gap: 12 (ref 5–15)
BUN: 18 mg/dL (ref 6–20)
CO2: 24 mmol/L (ref 22–32)
Calcium: 9 mg/dL (ref 8.9–10.3)
Chloride: 103 mmol/L (ref 98–111)
Creatinine, Ser: 0.82 mg/dL (ref 0.44–1.00)
GFR, Estimated: >60 mL/min (ref >60)
Glucose, Bld: 146 mg/dL — ABNORMAL HIGH (ref 70–99)
Potassium: 3.8 mmol/L (ref 3.5–5.1)
Sodium: 139 mmol/L (ref 135–145)

## 2023-05-04 LAB — CBC
HCT: 37.9 % (ref 36.0–46.0)
Hemoglobin: 12.1 g/dL (ref 12.0–15.0)
MCH: 29.2 pg (ref 26.0–34.0)
MCHC: 31.9 g/dL (ref 30.0–36.0)
MCV: 91.5 fL (ref 80.0–100.0)
Platelets: 261 10*3/uL (ref 150–400)
RBC: 4.14 MIL/uL (ref 3.87–5.11)
RDW: 13 % (ref 11.5–15.5)
WBC: 13.1 10*3/uL — ABNORMAL HIGH (ref 4.0–10.5)
nRBC: 0 % (ref 0.0–0.2)

## 2023-05-04 LAB — GLUCOSE, CAPILLARY
Glucose-Capillary: 147 mg/dL — ABNORMAL HIGH (ref 70–99)
Glucose-Capillary: 152 mg/dL — ABNORMAL HIGH (ref 70–99)

## 2023-05-04 LAB — VITAMIN D 25 HYDROXY (VIT D DEFICIENCY, FRACTURES): Vit D, 25-Hydroxy: 26.57 ng/mL — ABNORMAL LOW (ref 30–100)

## 2023-05-04 LAB — HIV ANTIBODY (ROUTINE TESTING W REFLEX): HIV Screen 4th Generation wRfx: NONREACTIVE

## 2023-05-04 MED ORDER — APIXABAN 2.5 MG PO TABS: 2.5000 mg | ORAL_TABLET | Freq: Two times a day (BID) | ORAL | 0 refills | Status: DC

## 2023-05-04 MED ORDER — CEFAZOLIN SODIUM-DEXTROSE 1-4 GM/50ML-% IV SOLN
1.0000 g | Freq: Once | INTRAVENOUS | Status: AC
  Administered 2023-05-04: 1 g via INTRAVENOUS
  Filled 2023-05-04: qty 50

## 2023-05-04 MED ORDER — OXYCODONE-ACETAMINOPHEN 5-325 MG PO TABS: 1.0000 | ORAL_TABLET | Freq: Four times a day (QID) | ORAL | 0 refills | Status: AC | PRN

## 2023-05-04 NOTE — Progress Notes (Signed)
Reviewed discharge orders with patient. Patient acknowledged understanding. Patient received rolling walker to bring home. Patient discharged with personal belongings. Patient waiting for [facility] to transport her home.

## 2023-05-04 NOTE — Plan of Care (Signed)

## 2023-05-04 NOTE — TOC Transition Note (Signed)
Transition of Care [facility]) - CM/SW Discharge Note   Patient Details  Name: Andrea [name] MRN: [id number] Date of Birth: 27-Feb-1981  [facility] of Care [facility]) CM/SW Contact:  Hetty Ely, RN Phone Number: 05/04/2023      Final next level of care: Home/Self Care Barriers to Discharge: Barriers Resolved                 Patient to be transferred to facility by: [facility] Name of family member notified: PATIENT Patient and family notified of of transfer: 05/04/23  Discharge Plan and Services Additional resources added to the After Visit Summary for                  DME Arranged: Walker rolling DME Agency: [employer] Date DME Agency Contacted: 05/04/23   Representative spoke with at DME Agency: Weekend Call person [facility] Arranged: NA HH Agency: NA        Social Determinants of Health (SDOH) Interventions SDOH Screenings   Food Insecurity: No Food Insecurity (05/03/2023)  Housing: Low Risk  (05/03/2023)  Transportation Needs: No Transportation Needs (05/03/2023)  Utilities: Not At Risk (05/03/2023)  Alcohol Screen: Low Risk  (04/24/2017)  Depression (PHQ2-9): High Risk (03/25/2022)  Tobacco Use: Low Risk  (05/03/2023)     Readmission Risk Interventions     No data to display

## 2023-05-04 NOTE — Progress Notes (Addendum)
  Subjective:  Patient ID: Andrea [name], female    DOB: 18-Jan-1981,  MRN: [id number]  She is feeling well she had therapy this morning she is ready to go home  Negative for chest pain and shortness of breath Fever: no Night sweats: no Weight loss: no Review of all other systems is negative Objective:   Vitals:   05/04/23 0100 05/04/23 0820  BP: 117/78 138/83  Pulse: 95 81  Resp: 18 18  Temp: 98.5 F (36.9 C) 98.3 F (36.8 C)  SpO2: 93% 94%   General AA&O x3. Normal mood and affect.  Vascular Dorsalis pedis and posterior tibial pulses 2/4 bilat. Brisk capillary refill to all digits. Pedal hair present.  Neurologic Epicritic sensation grossly intact.  Dermatologic Splint clean dry and intact  Orthopedic: MMT 5/5 in dorsiflexion, plantarflexion, inversion, and eversion. Normal joint ROM without pain or crepitus.    Assessment & Plan:  Patient was evaluated and treated and all questions answered.  POD #1 ORIF trimalleolar right ankle fracture -Okay to DC from our standpoint when she has received her final dose of Ancef today.  -Vitamin D level is ordered -Nonweightbearing RLE -Advised to continue ice and elevation right lower extremity.  Ice behind knee at home -My office will arrange follow-up in 1 week -She should have DVT prophylaxis at discharge, would prefer Eliquis or Xarelto. -She intermittently takes 7.5 mg Norco for chronic back pain.  Okay to utilize this every 4-6 hours as needed for pain.  Expect pain should greatly improve over the next few days   Edwin Cap, DPM  Accessible via secure chat for questions or concerns.

## 2023-05-04 NOTE — Evaluation (Signed)
Physical Therapy Evaluation Patient Details Name: Andrea [name] MRN: [id number] DOB: [birth year] Today's Date: 05/04/2023  History of Present Illness  Patient is a 42 year old female felt dizzy when getting out of bed, fell and rolled her ankle with pain. Found to have right ankle fracture s/p ORIF. History of L ankle injury, diabetes, bipolar and, schizophrenia  Clinical Impression  Patient is agreeable to PT evaluation. She is independent at baseline, working, and lives in an apartment with her significant other. She reports having 3 flights of steps to get to her apartment without an elevator option.   Today, the reports she is eager to be discharged today if possible. Patient is Mod I with bed mobility. Transfer training initiated with rolling walker. Patient is able to stand from bed and perform stand step transfer from bed to bed side commode with CGA for safety. With cues for technique, patient is able to maintain NWB of RLE with functional activity. She is able to take a couple of steps with the rolling walker while maintaining NWB of RLE, however could benefit from a wheelchair for longer distance mobility for safety. Encouraged ice and elevation of RLE when not mobilizing. No increased pain is reported with mobility. The patient reports she plans to use the rescue squad to assist her with getting up the steps to get to her apartment. No immediate PT needs anticipated after this hospital stay. Consider outpatient PT for ankle at at later time if needed. PT will continue to follow if patient remains in the hospital.       If plan is discharge home, recommend the following: A little help with bathing/dressing/bathroom;Assist for transportation;Help with stairs or ramp for entrance;Assistance with cooking/housework   Can travel by private Programmer, multimedia (with elevating leg rests);Rolling walker (2 wheels); patient declined BSC  Recommendations for  Other Services       Functional Status Assessment Patient has had a recent decline in their functional status and demonstrates the ability to make significant improvements in function in a reasonable and predictable amount of time.     Precautions / Restrictions Precautions Precautions: Fall Restrictions Weight Bearing Restrictions: Yes RLE Weight Bearing: Non weight bearing      Mobility  Bed Mobility Overal bed mobility: Modified Independent                  Transfers Overall transfer level: Needs assistance Equipment used: Rolling walker (2 wheels) Transfers: Sit to/from Stand, Bed to chair/wheelchair/BSC Sit to Stand: Contact guard assist   Step pivot transfers: Contact guard assist       General transfer comment: verbal cues for technique to maintian NWB of RLE with functional transfers. patient is able to perform sit to stand from bed in lowest height and perform stand step transfer to and from bed side commode with rolling walker with CGA. No reported increased pain with mobility    Ambulation/Gait Ambulation/Gait assistance: Contact guard assist Gait Distance (Feet): 1 Feet Assistive device: Rolling walker (2 wheels)         General Gait Details: patient able to complete a hop step with rolling walker while maintaining NWB of RLE. recommend to use wheelchair for community mobility for safety  Stairs            Wheelchair Mobility     Tilt Bed    Modified Rankin (Stroke Patients Only)       Balance Overall balance assessment: Needs  assistance Sitting-balance support: Feet supported Sitting balance-Leahy Scale: Good Sitting balance - Comments: weight shifting and reaching outside base of support without loss of balance   Standing balance support: Single extremity supported Standing balance-Leahy Scale: Fair Standing balance comment: no external support from therapist required with standing for toileting tasks.                              Pertinent Vitals/Pain Pain Assessment Pain Assessment: 0-10 Pain Score: 9  Pain Location: R ankle Pain Descriptors / Indicators: Discomfort Pain Intervention(s): RN gave pain meds during session, Monitored during session    Home Living Family/patient expects to be discharged to:: Private residence Living Arrangements: Spouse/significant other;Children Available Help at Discharge: Family;Available PRN/intermittently (significant other works at night) Type of Home: Apartment Home Access: Stairs to enter Entrance Stairs-Rails: Right;Left;Can reach both Entrance Stairs-Number of Steps: 3 flights   Home Layout: One level Home Equipment: None      Prior Function Prior Level of Function : Independent/Modified Independent;Working/employed             Mobility Comments: patient works 2 jobs. she is typically independent with mobility ADLs Comments: independent     Extremity/Trunk Assessment   Upper Extremity Assessment Upper Extremity Assessment: Overall WFL for tasks assessed    Lower Extremity Assessment Lower Extremity Assessment: RLE deficits/detail RLE Deficits / Details: distal RLE with post-op  sugar-tong fiberglass splint. hip and knee ROM WFL. patient is able to SLR independently RLE Sensation: decreased light touch (patient reports toe numbness)       Communication   Communication Communication: No apparent difficulties  Cognition Arousal: Alert Behavior During Therapy: WFL for tasks assessed/performed Overall Cognitive Status: Within Functional Limits for tasks assessed                                          General Comments General comments (skin integrity, edema, etc.): encouraged ice and elevation of RLE when not mobilizing for edema and pain management    Exercises     Assessment/Plan    PT Assessment Patient needs continued PT services  PT Problem List Decreased range of motion;Decreased activity  tolerance;Decreased mobility;Decreased balance;Decreased knowledge of precautions;Decreased safety awareness;Pain       PT Treatment Interventions DME instruction;Gait training;Stair training;Functional mobility training;Therapeutic activities;Therapeutic exercise;Cognitive remediation;Patient/family education;Wheelchair mobility training    PT Goals (Current goals can be found in the Care Plan section)  Acute Rehab PT Goals Patient Stated Goal: to go home today PT Goal Formulation: With patient Time For Goal Achievement: 05/18/23 Potential to Achieve Goals: Good    Frequency Min 1X/week     Co-evaluation               AM-PAC PT "6 Clicks" Mobility  Outcome Measure Help needed turning from your back to your side while in a flat bed without using bedrails?: None Help needed moving from lying on your back to sitting on the side of a flat bed without using bedrails?: A Little Help needed moving to and from a bed to a chair (including a wheelchair)?: A Little Help needed standing up from a chair using your arms (e.g., wheelchair or bedside chair)?: A Little Help needed to walk in hospital room?: A Little Help needed climbing 3-5 steps with a railing? : A Lot 6 Click Score: 18  End of Session   Activity Tolerance: Patient tolerated treatment well Patient left: in bed;with call bell/phone within reach;with bed alarm set Nurse Communication: Mobility status PT Visit Diagnosis: Difficulty in walking, not elsewhere classified (R26.2);Other abnormalities of gait and mobility (R26.89)    Time: 8469-6295 PT Time Calculation (min) (ACUTE ONLY): 32 min   Charges:   PT Evaluation $PT Eval Low Complexity: 1 Low PT Treatments $Therapeutic Activity: 8-22 mins PT General Charges $$ ACUTE PT VISIT: 1 Visit         Donna Bernard, PT, MPT   Ina Homes 05/04/2023, 10:25 AM

## 2023-05-04 NOTE — Plan of Care (Signed)
  Problem: Education: Goal: Knowledge of General Education information will improve Description: Including pain rating scale, medication(s)/side effects and non-pharmacologic comfort measures Outcome: Progressing   Problem: Nutrition: Goal: Adequate nutrition will be maintained Outcome: Progressing   Problem: Elimination: Goal: Will not experience complications related to urinary retention Outcome: Progressing   Problem: Safety: Goal: Ability to remain free from injury will improve Outcome: Progressing   Problem: Skin Integrity: Goal: Risk for impaired skin integrity will decrease Outcome: Progressing

## 2023-05-04 NOTE — Discharge Summary (Signed)
Physician Discharge Summary   Patient: Andrea [name] MRN: [id number]  DOB: 10/13/1980   Admit:     Date of Admission: 05/03/2023 Admitted from: home   Discharge: Date of discharge: 05/04/23 Disposition: Home Condition at discharge: good  CODE STATUS: FULL CODE     Discharge Physician: Sunnie Nielsen, DO [facility]     PCP: Golden Pop, FNP  Recommendations for Outpatient Follow-up:  Follow up with PCP Golden Pop, FNP in 2-4 weeks Follow up with podiatry Dr Lilian Kapur - his office will call to schedule    Discharge Instructions     Diet - low sodium heart healthy   Complete by: As directed    Increase activity slowly   Complete by: As directed          Discharge Diagnoses: Principal Problem:   Closed right ankle fracture Active Problems:   Type 2 diabetes mellitus with peripheral neuropathy (HCC)   Bipolar 1 disorder (HCC)   Essential hypertension   Mild persistent asthma   Closed trimalleolar fracture of right ankle   Pre-syncope       Hospital Course: HPI: Andrea [name] is a 41 y.o. female  w/ PMH prior hypertension no longer on antihypertensives due to normalization of blood pressure, bipolar and schizophrenia, diabetes, who presents with a fall and ankle injury. She awoke from bed after her husband got home from work around 3 AM stood up to get something from the closet felt lightheaded fell backwards onto her ankle and sustained a right ankle injury.  Hospital course / significant events:  11/16: admitted to hospitalist w/ trimalleolar ankle fracture, podiatry consulted, underwent ORIF 11/17: stable for d/c home   Consultants:  Podiatry   Procedures/Surgeries: 05/03/23 ORIF R ankle - Dr Lilian Kapur w/ podiatry      ASSESSMENT & PLAN:    PD1 ORIF R trimalleolar ankle fracture  Nonweightbearing RLE continue ice and elevation right lower extremity.  Ice behind knee at home Dr Vara Guardian office will  arrange follow-up in 1 week DVT prophylaxis at discharge, would prefer Eliquis or Xarelto. Pain control   Type 2 diabetes mellitus with peripheral neuropathy  Home Jardiance and Neurontin.   Mild persistent asthma Home inhalers and Singulair.   Essential hypertension Home prazosin, spironolactone    Bipolar 1 disorder (HCC) associated with schizophrenia. Home duloxetine, prazosin, quetiapine, trazodone, zonisamide            Discharge Instructions  Allergies as of 05/04/2023       Reactions   Bactrim [sulfamethoxazole-trimethoprim] Hives, Itching   Codeine Hives, Itching        Medication List     STOP taking these medications    amoxicillin-clavulanate 875-125 MG tablet Commonly known as: AUGMENTIN   fluconazole 150 MG tablet Commonly known as: DIFLUCAN   HYDROcodone-acetaminophen 7.5-325 MG tablet Commonly known as: NORCO       TAKE these medications    Airsupra 90-80 MCG/ACT Aero Generic drug: Albuterol-Budesonide   albuterol (2.5 MG/3ML) 0.083% nebulizer solution Commonly known as: PROVENTIL Take 2.5 mg by nebulization every 4 (four) hours as needed.   DULoxetine 60 MG capsule Commonly known as: CYMBALTA Take 2 capsules (120 mg total) by mouth daily.   Emgality 120 MG/ML Soaj Generic drug: Galcanezumab-gnlm Inject 1 Pen into the skin every 30 (thirty) days.   famotidine 40 MG tablet Commonly known as: PEPCID Take 40 mg by mouth 2 (two) times daily.   gabapentin 300 MG capsule Commonly known  as: Neurontin Take 1 pill each morning, one at noon, and 2 at night.   Gemtesa 75 MG Tabs Generic drug: Vibegron Take 1 tablet by mouth daily.   ibuprofen 800 MG tablet Commonly known as: ADVIL Take 800 mg by mouth every 8 (eight) hours as needed.   Jardiance 25 MG Tabs tablet Generic drug: empagliflozin Take 25 mg by mouth every morning.   levocetirizine 5 MG tablet Commonly known as: XYZAL Take 5 mg by mouth at bedtime.    nabumetone 750 MG tablet Commonly known as: RELAFEN Take 750 mg by mouth 2 (two) times daily.   ondansetron 4 MG disintegrating tablet Commonly known as: ZOFRAN-ODT Take 1 tablet (4 mg total) by mouth every 8 (eight) hours as needed for nausea or vomiting.   oxyCODONE-acetaminophen 5-325 MG tablet Commonly known as: PERCOCET/ROXICET Take 1-2 tablets by mouth every 6 (six) hours as needed for up to 5 days for moderate pain (pain score 4-6) or severe pain (pain score 7-10).   pantoprazole 40 MG tablet Commonly known as: PROTONIX Take 40 mg by mouth every morning.   potassium chloride 10 MEQ CR capsule Commonly known as: MICRO-K Take 10 mEq by mouth daily.   prazosin 2 MG capsule Commonly known as: MINIPRESS Take 2 capsules (4 mg total) by mouth at bedtime.   QUEtiapine 50 MG tablet Commonly known as: SEROquel Take one tablet in the morning, one at midday, 2 at night.   Qulipta 30 MG Tabs Generic drug: Atogepant Take 1 tablet daily   Rybelsus 14 MG Tabs Generic drug: Semaglutide Take 1 tablet by mouth daily.   Singulair 10 MG tablet Generic drug: montelukast Take 10 mg by mouth daily.   spironolactone 25 MG tablet Commonly known as: ALDACTONE Take 25 mg by mouth daily.   SUMAtriptan 100 MG tablet Commonly known as: IMITREX Take 1 tablet at onset of migraine. May May repeat in 2 hours if headache persists or recurs. Do not take more than 3 a week   Symbicort 160-4.5 MCG/ACT inhaler Generic drug: budesonide-formoterol Inhale 2 puffs into the lungs.   tiZANidine 4 MG tablet Commonly known as: ZANAFLEX Take 4 mg by mouth every 8 (eight) hours as needed for muscle spasms.   traZODone 150 MG tablet Commonly known as: DESYREL Take 150 mg by mouth at bedtime.   zonisamide 100 MG capsule Commonly known as: ZONEGRAN Take 5 capsules every night               Durable Medical Equipment  (From admission, onward)           Start     Ordered   05/04/23  1134  For home use only DME Walker rolling  Once       Question Answer Comment  Walker: With 5 Inch Wheels   Patient needs a walker to treat with the following condition Ankle fracture      05/04/23 1133              Allergies  Allergen Reactions   Bactrim [Sulfamethoxazole-Trimethoprim] Hives and Itching   Codeine Hives and Itching     Subjective: pt reports feeling ok this morning, pain is controlled on current emds, tolerating diet, worked w/ PT, feels ready for dc home    Discharge Exam: BP 138/83 (BP Location: Right Arm)   Pulse 81   Temp 98.3 F (36.8 C)   Resp 18   Ht 5\' 11"  (1.803 m)   Wt 133.7 kg   LMP  (  LMP Unknown)   SpO2 94%   BMI 41.10 kg/m  General: Pt is alert, awake, not in acute distress Cardiovascular: RRR, S1/S2 +, no rubs, no gallops Respiratory: CTA bilaterally, no wheezing, no rhonchi Abdominal: Soft, NT, ND, bowel sounds Extremities: no edema, no cyanosis, NV intact lower extremities bilaterally     The results of significant diagnostics from this hospitalization (including imaging, microbiology, ancillary and laboratory) are listed below for reference.     Microbiology: No results found for this or any previous visit (from the past 240 hour(s)).   Labs: BNP (last 3 results) No results for input(s): "BNP" in the last 8760 hours. Basic Metabolic Panel: Recent Labs  Lab 05/03/23 0420 05/04/23 0551  NA 139 139  K 4.0 3.8  CL 104 103  CO2 26 24  GLUCOSE 146* 146*  BUN 21* 18  CREATININE 0.89 0.82  CALCIUM 9.1 9.0   Liver Function Tests: Recent Labs  Lab 05/03/23 0420  AST 17  ALT 21  ALKPHOS 93  BILITOT 0.8  PROT 6.6  ALBUMIN 3.8   No results for input(s): "LIPASE", "AMYLASE" in the last 168 hours. No results for input(s): "AMMONIA" in the last 168 hours. CBC: Recent Labs  Lab 05/03/23 0420 05/04/23 0551  WBC 8.9 13.1*  NEUTROABS 4.9  --   HGB 12.5 12.1  HCT 40.0 37.9  MCV 94.6 91.5  PLT 235 261   Cardiac  Enzymes: No results for input(s): "CKTOTAL", "CKMB", "CKMBINDEX", "TROPONINI" in the last 168 hours. BNP: Invalid input(s): "POCBNP" CBG: Recent Labs  Lab 05/03/23 1430 05/03/23 1709 05/03/23 2032 05/04/23 0746 05/04/23 1155  GLUCAP 165* 158* 167* 147* 152*   D-Dimer No results for input(s): "DDIMER" in the last 72 hours. Hgb A1c Recent Labs    05/03/23 0420  HGBA1C 6.4*   Lipid Profile No results for input(s): "CHOL", "HDL", "LDLCALC", "TRIG", "CHOLHDL", "LDLDIRECT" in the last 72 hours. Thyroid function studies No results for input(s): "TSH", "T4TOTAL", "T3FREE", "THYROIDAB" in the last 72 hours.  Invalid input(s): "FREET3" Anemia work up No results for input(s): "VITAMINB12", "FOLATE", "FERRITIN", "TIBC", "IRON", "RETICCTPCT" in the last 72 hours. Urinalysis    Component Value Date/Time   COLORURINE STRAW (A) 07/31/2021 1256   APPEARANCEUR CLEAR 07/31/2021 1256   LABSPEC 1.028 07/31/2021 1256   PHURINE 5.0 07/31/2021 1256   GLUCOSEU >=500 (A) 07/31/2021 1256   HGBUR NEGATIVE 07/31/2021 1256   BILIRUBINUR small (A) 02/04/2022 1918   BILIRUBINUR ++ 12/31/2016 1042   KETONESUR trace (5) (A) 02/04/2022 1918   KETONESUR NEGATIVE 07/31/2021 1256   PROTEINUR =30 (A) 02/04/2022 1918   PROTEINUR NEGATIVE 07/31/2021 1256   UROBILINOGEN 1.0 02/04/2022 1918   UROBILINOGEN 0.2 04/25/2014 1315   NITRITE Positive (A) 02/04/2022 1918   NITRITE NEGATIVE 07/31/2021 1256   LEUKOCYTESUR Negative 02/04/2022 1918   LEUKOCYTESUR NEGATIVE 07/31/2021 1256   Sepsis Labs Recent Labs  Lab 05/03/23 0420 05/04/23 0551  WBC 8.9 13.1*   Microbiology No results found for this or any previous visit (from the past 240 hour(s)). Imaging DG Ankle 2 Views Right  Result Date: 05/03/2023 CLINICAL DATA:  Right ankle ORIF EXAM: RIGHT ANKLE - 2 VIEW COMPARISON:  Same-day x-ray FINDINGS: Four C-arm fluoroscopic images were obtained intraoperatively and submitted for post operative  interpretation. Images obtained during right ankle ORIF with sideplate and screw fixation constructs of the distal tibia and fibula with transsyndesmotic screw fixation as well as a posterior to anterior partially threaded screw through the distal tibial plafond.  Improved fracture alignment. 5 minutes and 39 seconds of fluoroscopy time utilized. Radiation dose: 17.48 mGy. Please see the performing provider's procedural report for further detail. IMPRESSION: Intraoperative fluoroscopic images obtained during right ankle ORIF. Electronically Signed   By: Duanne Guess D.O.   On: 05/03/2023 15:15   DG C-Arm 1-60 Min-No Report  Result Date: 05/03/2023 Fluoroscopy was utilized by the requesting physician.  No radiographic interpretation.   DG C-Arm 1-60 Min-No Report  Result Date: 05/03/2023 Fluoroscopy was utilized by the requesting physician.  No radiographic interpretation.   DG C-Arm 1-60 Min-No Report  Result Date: 05/03/2023 Fluoroscopy was utilized by the requesting physician.  No radiographic interpretation.   DG C-Arm 1-60 Min-No Report  Result Date: 05/03/2023 Fluoroscopy was utilized by the requesting physician.  No radiographic interpretation.   CT Ankle Right Wo Contrast  Result Date: 05/03/2023 CLINICAL DATA:  Trimalleolar fracture with posterior dislocation of the ankle. EXAM: CT OF THE RIGHT ANKLE WITHOUT CONTRAST TECHNIQUE: Multidetector CT imaging of the right ankle was performed according to the standard protocol. Multiplanar CT image reconstructions were also generated. RADIATION DOSE REDUCTION: This exam was performed according to the departmental dose-optimization program which includes automated exposure control, adjustment of the mA and/or kV according to patient size and/or use of iterative reconstruction technique. COMPARISON:  Contemporaneous plain films. FINDINGS: Bones/Joint/Cartilage There is a comminuted oblique distal fibular fracture with the lower fracture  margin at the level of the tibial plafond. The main distal fracture fragment is posteriorly displaced up to 1 shaft width, laterally displaced by nearly a shaft's width and with comminution fragments in between fracture margins as well as along the anterior margin of the proximal fragment. Transverse fracture medial malleolus is noted with small comminution in between the fracture margins, with the distal fragment medially displaced with respect to the parent bone up to the width of the malleolus and displaced from the adjacent talus up to 7 mm. Comminuted posterior malleolar fracture is noted. The main distal fragment distracted posteriorly up to 1 cm with comminution between fracture margins and about 5 mm cephalad displacement. Posterolateral subluxation of the talus and foot together is seen, with preservation of the normal talar dome contour. The posteromedial aspects of the tibial plafond abut the mid talar dome. The distal tibia and fibula above the fracture sites remain together with no significant separation. There is tibiotalar hemarthrosis. Small plantar and dorsal calcaneal spurring is noted and is noninflammatory. There are no further fractures. No evidence of arthropathy or other focal bone abnormality. Ligaments Suboptimally assessed by CT. Muscles and Tendons Normal muscle bulk for age. No intramuscular hematoma. The tendons are not well studied with CT but are grossly intact as well as can be seen, including the peroneal and Achilles tendons. Soft tissues There is moderate edema at the ankle. No subcutaneous hematoma is seen. IMPRESSION: 1. Trimalleolar fracture dislocation of the ankle, with comminution and displacement of all 3 fracture fragments. 2. Posterolateral subluxation and mild apex lateral tilting of the talus and foot together. 3. Tibiotalar hemarthrosis. 4. Moderate soft tissue edema. 5. Calcaneal spurring. Electronically Signed   By: Almira Bar M.D.   On: 05/03/2023 06:41   DG  Ankle Complete Right  Result Date: 05/03/2023 CLINICAL DATA:  Fall with ankle deformity EXAM: RIGHT ANKLE - COMPLETE 3 VIEW COMPARISON:  None ipsilateral FINDINGS: Oblique fracture through the distal fibular shaft. Horizontal fracture through the medial malleolus. Posterior dislocation of the ankle with displaced posterior malleolus fracture. No detected talus  fracture or hindfoot subluxation. IMPRESSION: Trimalleolar fracture with posterior dislocation of the ankle. Electronically Signed   By: Tiburcio Pea M.D.   On: 05/03/2023 04:55      Time coordinating discharge over 30 minutes  SIGNED:  Sunnie Nielsen DO [facility]

## 2023-05-05 ENCOUNTER — Telehealth: Payer: Self-pay | Admitting: Podiatry

## 2023-05-05 LAB — GLUCOSE, CAPILLARY: Glucose-Capillary: 96 mg/dL (ref 70–99)

## 2023-05-05 MED ORDER — VITAMIN D (ERGOCALCIFEROL) 1.25 MG (50000 UNIT) PO CAPS: 50000.0000 [IU] | ORAL_CAPSULE | ORAL | 0 refills | Status: DC

## 2023-05-05 NOTE — Telephone Encounter (Signed)
Pt called and her vitamin d labs came back and appears a little low. Did you want her to start taking vitamin d

## 2023-05-05 NOTE — Telephone Encounter (Signed)
Notified pt vitamin d was sent to pharmacy. She said thank you

## 2023-05-06 ENCOUNTER — Ambulatory Visit: Payer: BC Managed Care – PPO | Admitting: Student in an Organized Health Care Education/Training Program

## 2023-05-06 ENCOUNTER — Encounter: Payer: Self-pay | Admitting: Podiatry

## 2023-05-12 ENCOUNTER — Encounter: Payer: Self-pay | Admitting: Podiatry

## 2023-05-12 ENCOUNTER — Emergency Department: Payer: BC Managed Care – PPO

## 2023-05-12 ENCOUNTER — Ambulatory Visit (INDEPENDENT_AMBULATORY_CARE_PROVIDER_SITE_OTHER): Payer: BC Managed Care – PPO

## 2023-05-12 ENCOUNTER — Encounter: Payer: Self-pay | Admitting: Medical Oncology

## 2023-05-12 ENCOUNTER — Ambulatory Visit (INDEPENDENT_AMBULATORY_CARE_PROVIDER_SITE_OTHER): Payer: No Typology Code available for payment source | Admitting: Podiatry

## 2023-05-12 ENCOUNTER — Inpatient Hospital Stay
Admission: EM | Admit: 2023-05-12 | Discharge: 2023-05-20 | DRG: 493 | Disposition: A | Discharge status: Skilled Nursing Facility | Payer: BC Managed Care – PPO | Attending: Internal Medicine | Admitting: Internal Medicine

## 2023-05-12 ENCOUNTER — Other Ambulatory Visit: Payer: Self-pay

## 2023-05-12 DIAGNOSIS — F319 Bipolar disorder, unspecified: Secondary | ICD-10-CM | POA: Diagnosis present

## 2023-05-12 DIAGNOSIS — Z90722 Acquired absence of ovaries, bilateral: Secondary | ICD-10-CM | POA: Diagnosis not present

## 2023-05-12 DIAGNOSIS — Z7984 Long term (current) use of oral hypoglycemic drugs: Secondary | ICD-10-CM | POA: Diagnosis not present

## 2023-05-12 DIAGNOSIS — F250 Schizoaffective disorder, bipolar type: Secondary | ICD-10-CM | POA: Diagnosis not present

## 2023-05-12 DIAGNOSIS — E1142 Type 2 diabetes mellitus with diabetic polyneuropathy: Secondary | ICD-10-CM | POA: Diagnosis present

## 2023-05-12 DIAGNOSIS — F410 Panic disorder [episodic paroxysmal anxiety] without agoraphobia: Secondary | ICD-10-CM | POA: Diagnosis not present

## 2023-05-12 DIAGNOSIS — Z8781 Personal history of (healed) traumatic fracture: Secondary | ICD-10-CM | POA: Diagnosis not present

## 2023-05-12 DIAGNOSIS — Z9889 Other specified postprocedural states: Secondary | ICD-10-CM

## 2023-05-12 DIAGNOSIS — S82851P Displaced trimalleolar fracture of right lower leg, subsequent encounter for closed fracture with malunion: Secondary | ICD-10-CM | POA: Diagnosis not present

## 2023-05-12 DIAGNOSIS — Z6838 Body mass index (BMI) 38.0-38.9, adult: Secondary | ICD-10-CM | POA: Diagnosis not present

## 2023-05-12 DIAGNOSIS — T84126A Displacement of internal fixation device of bone of right lower leg, initial encounter: Secondary | ICD-10-CM | POA: Diagnosis present

## 2023-05-12 DIAGNOSIS — G40909 Epilepsy, unspecified, not intractable, without status epilepticus: Secondary | ICD-10-CM | POA: Diagnosis present

## 2023-05-12 DIAGNOSIS — S82891A Other fracture of right lower leg, initial encounter for closed fracture: Secondary | ICD-10-CM | POA: Diagnosis present

## 2023-05-12 DIAGNOSIS — I2510 Atherosclerotic heart disease of native coronary artery without angina pectoris: Secondary | ICD-10-CM | POA: Diagnosis present

## 2023-05-12 DIAGNOSIS — E669 Obesity, unspecified: Secondary | ICD-10-CM | POA: Diagnosis not present

## 2023-05-12 DIAGNOSIS — F411 Generalized anxiety disorder: Secondary | ICD-10-CM | POA: Diagnosis not present

## 2023-05-12 DIAGNOSIS — J4520 Mild intermittent asthma, uncomplicated: Secondary | ICD-10-CM | POA: Diagnosis not present

## 2023-05-12 DIAGNOSIS — I10 Essential (primary) hypertension: Secondary | ICD-10-CM | POA: Diagnosis present

## 2023-05-12 DIAGNOSIS — K5900 Constipation, unspecified: Secondary | ICD-10-CM | POA: Diagnosis not present

## 2023-05-12 DIAGNOSIS — Z9049 Acquired absence of other specified parts of digestive tract: Secondary | ICD-10-CM

## 2023-05-12 DIAGNOSIS — S82891S Other fracture of right lower leg, sequela: Secondary | ICD-10-CM

## 2023-05-12 DIAGNOSIS — S82851A Displaced trimalleolar fracture of right lower leg, initial encounter for closed fracture: Secondary | ICD-10-CM | POA: Diagnosis not present

## 2023-05-12 DIAGNOSIS — M9689 Other intraoperative and postprocedural complications and disorders of the musculoskeletal system: Principal | ICD-10-CM

## 2023-05-12 DIAGNOSIS — Z79899 Other long term (current) drug therapy: Secondary | ICD-10-CM | POA: Diagnosis not present

## 2023-05-12 DIAGNOSIS — J4530 Mild persistent asthma, uncomplicated: Secondary | ICD-10-CM | POA: Diagnosis present

## 2023-05-12 DIAGNOSIS — W19XXXD Unspecified fall, subsequent encounter: Secondary | ICD-10-CM | POA: Diagnosis present

## 2023-05-12 DIAGNOSIS — M21171 Varus deformity, not elsewhere classified, right ankle: Secondary | ICD-10-CM

## 2023-05-12 DIAGNOSIS — E785 Hyperlipidemia, unspecified: Secondary | ICD-10-CM | POA: Diagnosis present

## 2023-05-12 DIAGNOSIS — Y838 Other surgical procedures as the cause of abnormal reaction of the patient, or of later complication, without mention of misadventure at the time of the procedure: Secondary | ICD-10-CM | POA: Diagnosis present

## 2023-05-12 DIAGNOSIS — Z7901 Long term (current) use of anticoagulants: Secondary | ICD-10-CM

## 2023-05-12 DIAGNOSIS — Z885 Allergy status to narcotic agent status: Secondary | ICD-10-CM | POA: Diagnosis not present

## 2023-05-12 DIAGNOSIS — Z7951 Long term (current) use of inhaled steroids: Secondary | ICD-10-CM

## 2023-05-12 DIAGNOSIS — S82851K Displaced trimalleolar fracture of right lower leg, subsequent encounter for closed fracture with nonunion: Secondary | ICD-10-CM | POA: Diagnosis not present

## 2023-05-12 DIAGNOSIS — Z87442 Personal history of urinary calculi: Secondary | ICD-10-CM

## 2023-05-12 DIAGNOSIS — K219 Gastro-esophageal reflux disease without esophagitis: Secondary | ICD-10-CM | POA: Diagnosis present

## 2023-05-12 DIAGNOSIS — Z9079 Acquired absence of other genital organ(s): Secondary | ICD-10-CM | POA: Diagnosis not present

## 2023-05-12 DIAGNOSIS — F209 Schizophrenia, unspecified: Secondary | ICD-10-CM | POA: Diagnosis present

## 2023-05-12 DIAGNOSIS — Z90710 Acquired absence of both cervix and uterus: Secondary | ICD-10-CM

## 2023-05-12 DIAGNOSIS — E282 Polycystic ovarian syndrome: Secondary | ICD-10-CM | POA: Diagnosis present

## 2023-05-12 LAB — CBC WITH DIFFERENTIAL/PLATELET
Abs Immature Granulocytes: 0.08 10*3/uL — ABNORMAL HIGH (ref 0.00–0.07)
Basophils Absolute: 0.1 10*3/uL (ref 0.0–0.1)
Basophils Relative: 1 %
Eosinophils Absolute: 0.2 10*3/uL (ref 0.0–0.5)
Eosinophils Relative: 3 %
HCT: 38.5 % (ref 36.0–46.0)
Hemoglobin: 12.3 g/dL (ref 12.0–15.0)
Immature Granulocytes: 1 %
Lymphocytes Relative: 28 %
Lymphs Abs: 2.7 10*3/uL (ref 0.7–4.0)
MCH: 29.9 pg (ref 26.0–34.0)
MCHC: 31.9 g/dL (ref 30.0–36.0)
MCV: 93.7 fL (ref 80.0–100.0)
Monocytes Absolute: 0.5 10*3/uL (ref 0.1–1.0)
Monocytes Relative: 5 %
Neutro Abs: 6.2 10*3/uL (ref 1.7–7.7)
Neutrophils Relative %: 62 %
Platelets: 251 10*3/uL (ref 150–400)
RBC: 4.11 MIL/uL (ref 3.87–5.11)
RDW: 13.2 % (ref 11.5–15.5)
WBC: 9.7 10*3/uL (ref 4.0–10.5)
nRBC: 0 % (ref 0.0–0.2)

## 2023-05-12 LAB — TYPE AND SCREEN
ABO/RH(D): O POS
Antibody Screen: NEGATIVE

## 2023-05-12 LAB — BASIC METABOLIC PANEL
Anion gap: 11 (ref 5–15)
BUN: 27 mg/dL — ABNORMAL HIGH (ref 6–20)
CO2: 23 mmol/L (ref 22–32)
Calcium: 9.3 mg/dL (ref 8.9–10.3)
Chloride: 102 mmol/L (ref 98–111)
Creatinine, Ser: 0.95 mg/dL (ref 0.44–1.00)
GFR, Estimated: >60 mL/min (ref >60)
Glucose, Bld: 138 mg/dL — ABNORMAL HIGH (ref 70–99)
Potassium: 3.6 mmol/L (ref 3.5–5.1)
Sodium: 136 mmol/L (ref 135–145)

## 2023-05-12 LAB — CBG MONITORING, ED: Glucose-Capillary: 139 mg/dL — ABNORMAL HIGH (ref 70–99)

## 2023-05-12 LAB — GLUCOSE, CAPILLARY: Glucose-Capillary: 105 mg/dL — ABNORMAL HIGH (ref 70–99)

## 2023-05-12 MED ORDER — MONTELUKAST SODIUM 10 MG PO TABS
10.0000 mg | ORAL_TABLET | Freq: Every day | ORAL | Status: DC
Start: 2023-05-13 — End: 2023-05-20
  Administered 2023-05-13 – 2023-05-20 (×8): 10 mg via ORAL
  Filled 2023-05-12 (×8): qty 1

## 2023-05-12 MED ORDER — ZONISAMIDE 100 MG PO CAPS: 100.0000 mg | ORAL_CAPSULE | Freq: Every day | ORAL | Status: DC

## 2023-05-12 MED ORDER — PRAZOSIN HCL 2 MG PO CAPS
4.0000 mg | ORAL_CAPSULE | Freq: Every day | ORAL | Status: DC
  Administered 2023-05-12 – 2023-05-19 (×8): 4 mg via ORAL
  Filled 2023-05-12 (×9): qty 2

## 2023-05-12 MED ORDER — HYDROMORPHONE HCL 1 MG/ML IJ SOLN
0.5000 mg | INTRAMUSCULAR | Status: DC | PRN
  Administered 2023-05-12 (×2): 0.5 mg via INTRAVENOUS
  Administered 2023-05-13 (×2): 1 mg via INTRAVENOUS
  Administered 2023-05-13 – 2023-05-14 (×5): 0.5 mg via INTRAVENOUS
  Filled 2023-05-12 (×7): qty 0.5

## 2023-05-12 MED ORDER — ALBUTEROL SULFATE (2.5 MG/3ML) 0.083% IN NEBU: 2.5000 mg | INHALATION_SOLUTION | RESPIRATORY_TRACT | Status: DC | PRN

## 2023-05-12 MED ORDER — ENOXAPARIN SODIUM 60 MG/0.6ML IJ SOSY: 0.5000 mg/kg | PREFILLED_SYRINGE | INTRAMUSCULAR | Status: DC

## 2023-05-12 MED ORDER — GABAPENTIN 300 MG PO CAPS: 300.0000 mg | ORAL_CAPSULE | Freq: Every day | ORAL | Status: DC

## 2023-05-12 MED ORDER — LEVOCETIRIZINE DIHYDROCHLORIDE 5 MG PO TABS: 5.0000 mg | ORAL_TABLET | Freq: Every day | ORAL | Status: DC

## 2023-05-12 MED ORDER — FAMOTIDINE 20 MG PO TABS
40.0000 mg | ORAL_TABLET | Freq: Two times a day (BID) | ORAL | Status: DC
  Administered 2023-05-12 – 2023-05-20 (×16): 40 mg via ORAL
  Filled 2023-05-12 (×16): qty 2

## 2023-05-12 MED ORDER — DULOXETINE HCL 30 MG PO CPEP
120.0000 mg | ORAL_CAPSULE | Freq: Every day | ORAL | Status: DC
  Administered 2023-05-13 – 2023-05-20 (×8): 120 mg via ORAL
  Filled 2023-05-12 (×8): qty 4

## 2023-05-12 MED ORDER — MOMETASONE FURO-FORMOTEROL FUM 200-5 MCG/ACT IN AERO
2.0000 | INHALATION_SPRAY | Freq: Two times a day (BID) | RESPIRATORY_TRACT | Status: DC
  Administered 2023-05-12 – 2023-05-15 (×6): 2 via RESPIRATORY_TRACT
  Filled 2023-05-12: qty 8.8

## 2023-05-12 MED ORDER — GABAPENTIN 300 MG PO CAPS
300.0000 mg | ORAL_CAPSULE | Freq: Two times a day (BID) | ORAL | Status: DC
  Administered 2023-05-13 – 2023-05-20 (×15): 300 mg via ORAL
  Filled 2023-05-12 (×15): qty 1

## 2023-05-12 MED ORDER — MIRABEGRON ER 25 MG PO TB24
25.0000 mg | ORAL_TABLET | Freq: Every day | ORAL | Status: DC
  Administered 2023-05-13 – 2023-05-20 (×6): 25 mg via ORAL
  Filled 2023-05-12 (×8): qty 1

## 2023-05-12 MED ORDER — OXYCODONE-ACETAMINOPHEN 5-325 MG PO TABS
1.0000 | ORAL_TABLET | Freq: Once | ORAL | Status: AC
  Administered 2023-05-12: 1 via ORAL
  Filled 2023-05-12: qty 1

## 2023-05-12 MED ORDER — INSULIN ASPART 100 UNIT/ML IJ SOLN
0.0000 [IU] | Freq: Every day | INTRAMUSCULAR | Status: DC
  Administered 2023-05-13: 2 [IU] via SUBCUTANEOUS
  Filled 2023-05-12: qty 1

## 2023-05-12 MED ORDER — ONDANSETRON HCL 4 MG/2ML IJ SOLN: 4.0000 mg | Freq: Four times a day (QID) | INTRAMUSCULAR | Status: DC | PRN

## 2023-05-12 MED ORDER — INSULIN ASPART 100 UNIT/ML IJ SOLN
0.0000 [IU] | Freq: Three times a day (TID) | INTRAMUSCULAR | Status: DC
  Administered 2023-05-12: 2 [IU] via SUBCUTANEOUS
  Administered 2023-05-14 (×2): 3 [IU] via SUBCUTANEOUS
  Administered 2023-05-14 – 2023-05-15 (×2): 2 [IU] via SUBCUTANEOUS
  Administered 2023-05-15: 3 [IU] via SUBCUTANEOUS
  Administered 2023-05-15: 2 [IU] via SUBCUTANEOUS
  Administered 2023-05-16 (×2): 3 [IU] via SUBCUTANEOUS
  Administered 2023-05-16: 2 [IU] via SUBCUTANEOUS
  Administered 2023-05-17 – 2023-05-18 (×4): 3 [IU] via SUBCUTANEOUS
  Administered 2023-05-18: 2 [IU] via SUBCUTANEOUS
  Administered 2023-05-18: 8 [IU] via SUBCUTANEOUS
  Administered 2023-05-19 – 2023-05-20 (×5): 3 [IU] via SUBCUTANEOUS
  Filled 2023-05-12 (×21): qty 1

## 2023-05-12 MED ORDER — ZONISAMIDE 100 MG PO CAPS
500.0000 mg | ORAL_CAPSULE | Freq: Every day | ORAL | Status: DC
  Administered 2023-05-12 – 2023-05-19 (×8): 500 mg via ORAL
  Filled 2023-05-12 (×9): qty 5

## 2023-05-12 MED ORDER — TRAZODONE HCL 50 MG PO TABS
150.0000 mg | ORAL_TABLET | Freq: Every day | ORAL | Status: DC
  Administered 2023-05-12 – 2023-05-19 (×8): 150 mg via ORAL
  Filled 2023-05-12 (×8): qty 1

## 2023-05-12 MED ORDER — OXYCODONE HCL 5 MG PO TABS
5.0000 mg | ORAL_TABLET | Freq: Four times a day (QID) | ORAL | Status: DC | PRN
  Administered 2023-05-12 – 2023-05-15 (×6): 5 mg via ORAL
  Filled 2023-05-12 (×6): qty 1

## 2023-05-12 MED ORDER — QUETIAPINE FUMARATE 25 MG PO TABS
50.0000 mg | ORAL_TABLET | Freq: Two times a day (BID) | ORAL | Status: DC
  Administered 2023-05-13 – 2023-05-20 (×14): 50 mg via ORAL
  Filled 2023-05-12 (×14): qty 2

## 2023-05-12 MED ORDER — PANTOPRAZOLE SODIUM 40 MG PO TBEC
40.0000 mg | DELAYED_RELEASE_TABLET | Freq: Every morning | ORAL | Status: DC
  Administered 2023-05-13 – 2023-05-20 (×8): 40 mg via ORAL
  Filled 2023-05-12 (×8): qty 1

## 2023-05-12 MED ORDER — LORATADINE 10 MG PO TABS
10.0000 mg | ORAL_TABLET | Freq: Every day | ORAL | Status: DC
  Administered 2023-05-12 – 2023-05-19 (×8): 10 mg via ORAL
  Filled 2023-05-12 (×8): qty 1

## 2023-05-12 MED ORDER — TIZANIDINE HCL 4 MG PO TABS
4.0000 mg | ORAL_TABLET | Freq: Three times a day (TID) | ORAL | Status: DC | PRN
  Administered 2023-05-18 – 2023-05-20 (×4): 4 mg via ORAL
  Filled 2023-05-12 (×4): qty 1

## 2023-05-12 MED ORDER — GABAPENTIN 300 MG PO CAPS
900.0000 mg | ORAL_CAPSULE | Freq: Every day | ORAL | Status: DC
  Administered 2023-05-12 – 2023-05-19 (×8): 900 mg via ORAL
  Filled 2023-05-12 (×8): qty 3

## 2023-05-12 MED ORDER — NON FORMULARY: 30.0000 mg | Freq: Every day | Status: DC

## 2023-05-12 MED ORDER — MECLIZINE HCL 25 MG PO TABS: 12.5000 mg | ORAL_TABLET | Freq: Three times a day (TID) | ORAL | Status: DC | PRN

## 2023-05-12 MED ORDER — ENOXAPARIN SODIUM 40 MG/0.4ML IJ SOSY: 40.0000 mg | PREFILLED_SYRINGE | INTRAMUSCULAR | Status: DC

## 2023-05-12 MED ORDER — GALCANEZUMAB-GNLM 120 MG/ML ~~LOC~~ SOAJ: 1.0000 | SUBCUTANEOUS | Status: DC

## 2023-05-12 MED ORDER — QUETIAPINE FUMARATE 25 MG PO TABS
100.0000 mg | ORAL_TABLET | Freq: Every day | ORAL | Status: DC
  Administered 2023-05-12 – 2023-05-19 (×8): 100 mg via ORAL
  Filled 2023-05-12 (×8): qty 4

## 2023-05-12 MED ORDER — QUETIAPINE FUMARATE 25 MG PO TABS: 50.0000 mg | ORAL_TABLET | Freq: Every day | ORAL | Status: DC

## 2023-05-12 NOTE — ED Notes (Signed)
Pt at CT

## 2023-05-12 NOTE — ED Provider Notes (Signed)
[facility] Provider Note    Event Date/Time   First MD Initiated Contact with Patient 05/12/23 1123     (approximate)   History   Ankle Problem   HPI Andrea [name] is a 42 y.o. female with recent ORIF for right trimalleolar ankle fracture presenting today for postop problem.  Patient recently had a fall on 05/03/2023 with previously stated fracture.  Underwent surgeries that same day.  Was discharged and had follow-up office visit today.  Repeat x-rays show concern for possible new fracture and hardware displacement.  Podiatry recommended patient come to the ED for CT and potential surgical revision.  Still having ongoing pain symptoms which she notes have worsened over the past 2 days.  Has had numbness to her toes which has been present since the initial injury.  Otherwise, no chest pain, shortness of breath.  Chart review: Reviewed recent admission and surgical notes as well as office visit today with podiatry.     Physical Exam   Triage Vital Signs: ED Triage Vitals  Encounter Vitals Group     BP 05/12/23 1100 105/76     Systolic BP Percentile --      Diastolic BP Percentile --      Pulse Rate 05/12/23 1100 73     Resp 05/12/23 1100 18     Temp 05/12/23 1100 98 F (36.7 C)     Temp Source 05/12/23 1100 Oral     SpO2 05/12/23 1100 96 %     Weight 05/12/23 1101 275 lb (124.7 kg)     Height 05/12/23 1101 5\' 11"  (1.803 m)     Head Circumference --      Peak Flow --      Pain Score 05/12/23 1101 9     Pain Loc --      Pain Education --      Exclude from Growth Chart --     Most recent vital signs: Vitals:   05/12/23 1100  BP: 105/76  Pulse: 73  Resp: 18  Temp: 98 F (36.7 C)  SpO2: 96%   I have reviewed the vital signs. General:  Awake, alert, no acute distress. Head:  Normocephalic, Atraumatic. EENT:  PERRL, EOMI, Oral mucosa pink and moist, Neck is supple. Cardiovascular: Regular rate, 2+ distal pulses. Respiratory:  Normal  respiratory effort, symmetrical expansion, no distress.   Extremities: Right ankle in splint that was just placed by podiatry after being completely taken down.  Numbness to toes on that side.  No discoloration of toes. Neuro:  Alert and oriented.  Interacting appropriately.   Skin:  Warm, dry, no rash.   Psych: Appropriate affect.    ED Results / Procedures / Treatments   Labs (all labs ordered are listed, but only abnormal results are displayed) Labs Reviewed  CBC WITH DIFFERENTIAL/PLATELET  BASIC METABOLIC PANEL  TYPE AND SCREEN     EKG    RADIOLOGY Independently interpreted CT and agree with radiology read   PROCEDURES:  Critical Care performed: No  Procedures   MEDICATIONS ORDERED IN ED: Medications  oxyCODONE-acetaminophen (PERCOCET/ROXICET) 5-325 MG per tablet 1 tablet (1 tablet Oral Given 05/12/23 1139)     IMPRESSION / MDM / ASSESSMENT AND PLAN / ED COURSE  I reviewed the triage vital signs and the nursing notes.                              Differential diagnosis  includes, but is not limited to, new ankle fracture, hardware displacement, hardware fracture  Patient's presentation is most consistent with acute complicated illness / injury requiring diagnostic workup.  Patient is a 42 year old female presenting today for CT of recently fractured right ankle status post ORIF.  X-rays show concern for new fracture versus hardware movement.  Patient having worsening pain symptoms as well.  Vital signs otherwise stable.  CT performed showing displacement of some of the fracture pieces and concern for hardware movement.  Discussed the case with podiatry on-call who does agree with admission for likely revision of surgery within the next few days.  Patient admitted to hospitalist for further care.  Clinical Course as of 05/12/23 1327  Mon May 12, 2023  1235 CT Ankle Right Wo Contrast Evidence of displacement of hardware.  Will consult podiatry. [DW]    Clinical  Course User Index [DW] Janith Lima, MD     FINAL CLINICAL IMPRESSION(S) / ED DIAGNOSES   Final diagnoses:  Postoperative surgical complication involving musculoskeletal system associated with musculoskeletal procedure, unspecified complication     Rx / DC Orders   ED Discharge Orders     None        Note:  This document was prepared using Dragon voice recognition software and may include unintentional dictation errors.   Janith Lima, MD 05/12/23 1328

## 2023-05-12 NOTE — Consult Note (Signed)
PODIATRY CONSULTATION  NAME Andrea [name] MRN [id number] DOB 05-Jan-1981 DOA 05/12/2023   Reason for consult:  Chief Complaint  Patient presents with   Ankle Problem    Attending/Consulting physician: Claudell Kyle MD  History of present illness: 42 y.o. female with Hx bipolar 1 disorder, type 2 diabetes mellitus, GERD, migraine, schizophrenia and seizure disorder who was sent in from clinic earlier today due to pain and deformity s/p ankle ORIF 04/23/23. Seen by Dr. Lilian Kapur who performed the surgery and saw her for follow up today. Pt comes into clinic using walker and weightbearing on the splint. She states trying to stay off it but can't. Has a knee scooter ordered but not arrived yet.   Past Medical History:  Diagnosis Date   Anxiety    Bipolar 1 disorder (HCC)    Bipolar 1 disorder, mixed, severe (HCC) 12/20/2016   Bipolar I disorder, most recent episode depressed (HCC) 12/20/2016   Complication of anesthesia    hard time waking up    Depression    Diabetes mellitus without complication (HCC)    Drug induced akathisia 04/02/2022   GERD (gastroesophageal reflux disease)    no meds   Kidney stones    Long term current use of antipsychotic medication 03/13/2022   Low back pain    Migraines    PCOS (polycystic ovarian syndrome)    PONV (postoperative nausea and vomiting)    Schizophrenia (HCC)    Seizure (HCC) 10/03/2021   Seizures (HCC) 06/04/2016   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Marilynne Drivers 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   Shortness of breath dyspnea    with bronchitis       Latest Ref Rng & Units 05/12/2023    1:29 PM 05/04/2023    5:51 AM 05/03/2023    4:20 AM  CBC  WBC 4.0 - 10.5 K/uL 9.7  13.1  8.9   Hemoglobin 12.0 - 15.0 g/dL 40.9  81.1  91.4   Hematocrit 36.0 - 46.0 % 38.5  37.9  40.0   Platelets 150 - 400 K/uL 251  261  235        Latest Ref Rng & Units 05/12/2023    1:29 PM 05/04/2023    5:51 AM 05/03/2023    4:20 AM  BMP  Glucose 70 - 99 mg/dL 782  956  213    BUN 6 - 20 mg/dL 27  18  21    Creatinine 0.44 - 1.00 mg/dL 0.86  5.78  4.69   Sodium 135 - 145 mmol/L 136  139  139   Potassium 3.5 - 5.1 mmol/L 3.6  3.8  4.0   Chloride 98 - 111 mmol/L 102  103  104   CO2 22 - 32 mmol/L 23  24  26    Calcium 8.9 - 10.3 mg/dL 9.3  9.0  9.1       Physical Exam: Lower Extremity Exam Vasc: R - PT palpable, DP palpable. Cap refill < 3 sec to digits  L - PT palpable, DP palpable. Cap refill <3 sec to digits  Derm: R - Dressing C/DI  L - Normal temp/texture/turgor with no open lesion or clinical signs of infection   MSK:  R -  Edema and pain R ankle  L -  No gross deformities. Compartments soft, non-tender, compressible  Neuro: R - Gross sensation diminished. Motor to toes intact  L - Gross sensation diminished. Gross motor function intact   CT R ankle 11/25: 1. Interval hook plate and screw fixation of  the previously seen displaced and angulated medial malleolar fracture. There is 7 mm of medial displacement of the superior aspect of the anterior distal medial malleolar fracture fragment. There is mild apparent backing out of the associated far anterior medial malleolar screw by approximately 6 mm. This also seen on today's radiographs. 2. Lateral plate and screw fixation of the distal fibula. Persistent lucency within oblique distal fibular diaphyseal metaphyseal fracture. 3. Oblique, mildly comminuted posterior malleolar fracture with mild improved alignment and up to 3 mm AP dimension diastasis. 4. There is a 2 mm bone density dorsal to the talar neck is unchanged from 05/03/2023, likely an age-indeterminate avulsion injury.    ASSESSMENT/PLAN OF CARE 42 y.o. female with PMHx significant for  bipolar 1 disorder, type 2 diabetes mellitus, GERD, migraine, schizophrenia and seizure disorder  with hardware failure of recent R ankle ORIF and resultant displacement of medial malleolus and varus deformity of the ankle mortise.  AF VSS WBC 9.7 11/16 A1c  6.4  - NPO after MN for OR tomorrow for right lower extremity tibiotalocalcaneal arthrodesis. This offers better definitive solution for this re fx of her ankle in setting of DM2 and neuropathy.  -strict elevation of the right lower extremity pre op.  - Will begin IV abx at time of surgery for 24-48 hrs  - Anticoagulation: Hold pending OR - Wound care: Leave dressing C/D/I - WB status: Nwb to RLE - Will continue to follow   Thank you for the consult.  Please contact me directly with any questions or concerns.           Corinna Gab, DPM [facility]    [address] [place], [place] [address]                Office [phone]  Fax [phone]

## 2023-05-12 NOTE — H&P (Signed)
History and Physical    Andrea [name] UEA:[id number] DOB: Nov 17, 1980 DOA: 05/12/2023  PCP: Golden Pop, FNP (Confirm with patient/family/NH records and if not entered, this has to be entered at [facility] point of entry) Patient coming from: Home  I have personally briefly reviewed patient's old medical records in [facility]  Chief Complaint: Right ankle pain and swelling  HPI: Andrea [name] is a 42 y.o. female with medical history significant of morbid obesity, HTN, bipolar and schizophrenia, IIDM recent right ankle fracture, sent from podiatry office for evaluation of nonhealing of right ankle fracture.  Patient sustained right ankle fracture 7 days ago underwent ORIF and sent home.  Patient however continued to experience swelling and pain of right ankle and went back to see podiatry today.  Outpatient x-ray showed suspicious signs of displaced fracture and podiatry sent patient to ED for further evaluation and in the ED, CT of right ankle showed 7 mm of medial displacement of superior aspect of the anterior distal medial and then malleolar fracture fragment.  Podiatry plan to take patient back to OR tomorrow to have a revision.  Review of Systems: As per HPI otherwise 14 point review of systems negative.    Past Medical History:  Diagnosis Date   Anxiety    Bipolar 1 disorder (HCC)    Bipolar 1 disorder, mixed, severe (HCC) 12/20/2016   Bipolar I disorder, most recent episode depressed (HCC) 12/20/2016   Complication of anesthesia    hard time waking up    Depression    Diabetes mellitus without complication (HCC)    Drug induced akathisia 04/02/2022   GERD (gastroesophageal reflux disease)    no meds   Kidney stones    Long term current use of antipsychotic medication 03/13/2022   Low back pain    Migraines    PCOS (polycystic ovarian syndrome)    PONV (postoperative nausea and vomiting)    Schizophrenia (HCC)    Seizure (HCC) 10/03/2021   Seizures (HCC)  06/04/2016   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Marilynne Drivers 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   Shortness of breath dyspnea    with bronchitis    Past Surgical History:  Procedure Laterality Date   ABDOMINAL HYSTERECTOMY N/A 11/14/2015   Procedure: HYSTERECTOMY ABDOMINAL;  Surgeon: Levi Aland, MD;  Location: [facility];  Service: Gynecology;  Laterality: N/A;   CHOLECYSTECTOMY     INTRAUTERINE DEVICE (IUD) INSERTION  06/14/2014   [facility]   LYMPH NODES REMOVED     ORIF ANKLE FRACTURE Right 05/03/2023   Procedure: OPEN REDUCTION INTERNAL FIXATION (ORIF) ANKLE FRACTURE;  Surgeon: Edwin Cap, DPM;  Location: [facility] OR[facility];  Service: Orthopedics/Podiatry;  Laterality: Right;   ovarian cyst removed     SALPINGOOPHORECTOMY Bilateral 11/14/2015   Procedure: SALPINGO OOPHORECTOMY;  Surgeon: Levi Aland, MD;  Location: [facility];  Service: Gynecology;  Laterality: Bilateral;     reports that she has never smoked. She has never used smokeless tobacco. She reports that she does not drink alcohol and does not use drugs.  Allergies  Allergen Reactions   Bactrim [Sulfamethoxazole-Trimethoprim] Hives and Itching   Codeine Hives and Itching    Family History  Problem Relation Age of Onset   Healthy Mother    Healthy Father      Prior to Admission medications   Medication Sig Start Date End Date Taking? Authorizing Provider  albuterol (PROVENTIL) (2.5 MG/3ML) 0.083% nebulizer solution Take 2.5 mg by nebulization every 4 (four) hours as needed.  03/30/23  Yes [provider]  Albuterol-Budesonide Paulene Floor) 90-80 MCG/ACT AERO  04/01/23  Yes [provider]  apixaban (ELIQUIS) 2.5 MG TABS tablet Take 1 tablet (2.5 mg total) by mouth 2 (two) times daily. 05/04/23  Yes Sunnie Nielsen, DO  Atogepant (QULIPTA) 30 MG TABS Take 1 tablet daily 03/18/23  Yes Van Clines, MD  calcium carbonate (OSCAL) 1500 (600 Ca) MG TABS tablet Take 1,500 mg by mouth 2 (two) times daily with a meal.   Yes  [provider]  DULoxetine (CYMBALTA) 60 MG capsule Take 2 capsules (120 mg total) by mouth daily. 03/24/23  Yes Elsie Lincoln, MD  empagliflozin (JARDIANCE) 25 MG TABS tablet Take 25 mg by mouth every morning.   Yes [provider]  famotidine (PEPCID) 40 MG tablet Take 40 mg by mouth 2 (two) times daily. 04/12/23  Yes [provider]  gabapentin (NEURONTIN) 300 MG capsule Take 1 pill each morning, one at noon, and 2 at night. 04/21/23  Yes Elsie Lincoln, MD  GEMTESA 75 MG TABS Take 1 tablet by mouth daily. 01/02/23  Yes [provider]  ibuprofen (ADVIL) 800 MG tablet Take 800 mg by mouth every 8 (eight) hours as needed. 02/06/23  Yes [provider]  levocetirizine (XYZAL) 5 MG tablet Take 5 mg by mouth at bedtime.   Yes [provider]  nabumetone (RELAFEN) 750 MG tablet Take 750 mg by mouth 2 (two) times daily. 12/10/22  Yes [provider]  naproxen sodium (ALEVE) 220 MG tablet Take 440 mg by mouth 2 (two) times daily as needed.   Yes [provider]  pantoprazole (PROTONIX) 40 MG tablet Take 40 mg by mouth every morning. 08/31/20  Yes [provider]  potassium chloride (MICRO-K) 10 MEQ CR capsule Take 10 mEq by mouth daily. 10/09/22  Yes [provider]  prazosin (MINIPRESS) 2 MG capsule Take 2 capsules (4 mg total) by mouth at bedtime. 03/24/23  Yes Elsie Lincoln, MD  QUEtiapine (SEROQUEL) 50 MG tablet Take one tablet in the morning, one at midday, 2 at night. 04/21/23  Yes Elsie Lincoln, MD  RYBELSUS 14 MG TABS Take 1 tablet by mouth daily. 02/27/23  Yes [provider]  SINGULAIR 10 MG tablet Take 10 mg by mouth daily. 08/28/22  Yes [provider]  SYMBICORT 160-4.5 MCG/ACT inhaler Inhale 2 puffs into the lungs. 08/13/22  Yes [provider]  tiZANidine (ZANAFLEX) 4 MG tablet Take 4 mg by mouth every 8 (eight) hours as needed for muscle spasms.   Yes [provider]  traZODone (DESYREL) 150 MG tablet Take 150 mg by mouth at bedtime. 12/13/22  Yes [provider]  zonisamide (ZONEGRAN) 100 MG capsule Take 5 capsules every night 12/06/22  Yes Van Clines, MD  Galcanezumab-gnlm [facility]) 120 MG/ML SOAJ Inject 1 Pen into the skin every 30 (thirty) days. 01/13/23   Van Clines, MD  meclizine (ANTIVERT) 12.5 MG tablet Take 12.5 mg by mouth 3 (three) times daily. 05/08/23   [provider]  ondansetron (ZOFRAN-ODT) 4 MG disintegrating tablet Take 1 tablet (4 mg total) by mouth every 8 (eight) hours as needed for nausea or vomiting. Patient not taking: Reported on 05/12/2023 04/23/22   Van Clines, MD  spironolactone (ALDACTONE) 25 MG tablet Take 25 mg by mouth daily. Patient not taking: Reported on 05/12/2023 10/08/22   [provider]  SUMAtriptan (IMITREX) 100 MG tablet Take 1 tablet at  onset of migraine. May May repeat in 2 hours if headache persists or recurs. Do not take more than 3 a week Patient not taking: Reported on 05/12/2023 12/06/22   Van Clines, MD  Vitamin D, Ergocalciferol, (DRISDOL) 1.25 MG (50000 UNIT) CAPS capsule Take 1 capsule (50,000 Units total) by mouth every 7 (seven) days. 05/05/23   Edwin Cap, DPM    Physical Exam: Vitals:   05/12/23 1100 05/12/23 1101 05/12/23 1530  BP: 105/76    Pulse: 73  78  Resp: 18    Temp: 98 F (36.7 C)    TempSrc: Oral    SpO2: 96%  94%  Weight:  124.7 kg   Height:  5\' 11"  (1.803 m)     Constitutional: NAD, calm, comfortable Vitals:   05/12/23 1100 05/12/23 1101 05/12/23 1530  BP: 105/76    Pulse: 73  78  Resp: 18    Temp: 98 F (36.7 C)    TempSrc: Oral    SpO2: 96%  94%  Weight:  124.7 kg   Height:  5\' 11"  (1.803 m)    Eyes: PERRL, lids and conjunctivae normal ENMT: Mucous membranes are moist. Posterior pharynx clear of any exudate or lesions.Normal dentition.  Neck: normal, supple, no masses, no thyromegaly Respiratory: clear  to auscultation bilaterally, no wheezing, no crackles. Normal respiratory effort. No accessory muscle use.  Cardiovascular: Regular rate and rhythm, no murmurs / rubs / gallops. No extremity edema. 2+ pedal pulses. No carotid bruits.  Abdomen: no tenderness, no masses palpated. No hepatosplenomegaly. Bowel sounds positive.  Musculoskeletal: Right foot in cam boot, swelling and tenderness right ankle Skin: no rashes, lesions, ulcers. No induration Neurologic: CN 2-12 grossly intact. Sensation intact, DTR normal. Strength 5/5 in all 4.  Psychiatric: Normal judgment and insight. Alert and oriented x 3. Normal mood.     Labs on Admission: I have personally reviewed following labs and imaging studies  CBC: Recent Labs  Lab 05/12/23 1329  WBC 9.7  NEUTROABS 6.2  HGB 12.3  HCT 38.5  MCV 93.7  PLT 251   Basic Metabolic Panel: Recent Labs  Lab 05/12/23 1329  NA 136  K 3.6  CL 102  CO2 23  GLUCOSE 138*  BUN 27*  CREATININE 0.95  CALCIUM 9.3   GFR: Estimated Creatinine Clearance: 112.5 mL/min (by C-G formula based on SCr of 0.95 mg/dL). Liver Function Tests: No results for input(s): "AST", "ALT", "ALKPHOS", "BILITOT", "PROT", "ALBUMIN" in the last 168 hours. No results for input(s): "LIPASE", "AMYLASE" in the last 168 hours. No results for input(s): "AMMONIA" in the last 168 hours. Coagulation Profile: No results for input(s): "INR", "PROTIME" in the last 168 hours. Cardiac Enzymes: No results for input(s): "CKTOTAL", "CKMB", "CKMBINDEX", "TROPONINI" in the last 168 hours. BNP (last 3 results) No results for input(s): "PROBNP" in the last 8760 hours. HbA1C: No results for input(s): "HGBA1C" in the last 72 hours. CBG: No results for input(s): "GLUCAP" in the last 168 hours. Lipid Profile: No results for input(s): "CHOL", "HDL", "LDLCALC", "TRIG", "CHOLHDL", "LDLDIRECT" in the last 72 hours. Thyroid Function Tests: No results for input(s): "TSH", "T4TOTAL", "FREET4",  "T3FREE", "THYROIDAB" in the last 72 hours. Anemia Panel: No results for input(s): "VITAMINB12", "FOLATE", "FERRITIN", "TIBC", "IRON", "RETICCTPCT" in the last 72 hours. Urine analysis:    Component Value Date/Time   COLORURINE STRAW (A) 07/31/2021 1256   APPEARANCEUR CLEAR 07/31/2021 1256   LABSPEC 1.028 07/31/2021 1256   PHURINE 5.0 07/31/2021 1256   GLUCOSEU >=  500 (A) 07/31/2021 1256   HGBUR NEGATIVE 07/31/2021 1256   BILIRUBINUR small (A) 02/04/2022 1918   BILIRUBINUR ++ 12/31/2016 1042   KETONESUR trace (5) (A) 02/04/2022 1918   KETONESUR NEGATIVE 07/31/2021 1256   PROTEINUR =30 (A) 02/04/2022 1918   PROTEINUR NEGATIVE 07/31/2021 1256   UROBILINOGEN 1.0 02/04/2022 1918   UROBILINOGEN 0.2 04/25/2014 1315   NITRITE Positive (A) 02/04/2022 1918   NITRITE NEGATIVE 07/31/2021 1256   LEUKOCYTESUR Negative 02/04/2022 1918   LEUKOCYTESUR NEGATIVE 07/31/2021 1256    Radiological Exams on Admission: CT Ankle Right Wo Contrast  Result Date: 05/12/2023 CLINICAL DATA:  Ankle trauma. Follow-up fracture. Status post ORIF 05/03/2023 orthopedic surgeon note describes today's x-rays show repeat fracture of the medial malleolus and pull out of hardware with ankle and 8-10 degrees of varus. Preoperative CT scan for revision versus removal and fusion right ankle pending results. EXAM: CT OF THE RIGHT ANKLE WITHOUT CONTRAST TECHNIQUE: Multidetector CT imaging of the right ankle was performed according to the standard protocol. Multiplanar CT image reconstructions were also generated. RADIATION DOSE REDUCTION: This exam was performed according to the departmental dose-optimization program which includes automated exposure control, adjustment of the mA and/or kV according to patient size and/or use of iterative reconstruction technique. COMPARISON:  Right ankle radiographs 05/12/2023, intraoperative fluoroscopy 05/03/2023, right ankle radiographs 05/03/2023; CT right ankle 05/03/2023 FINDINGS:  Bones/Joint/Cartilage Interval hook plate and screw fixation of the previously seen displaced and angulated medial malleolar fracture. There is 7 mm of medial displacement of the superior aspect of the anterior distal medial malleolar fracture fragment (coronal series 6, image 58, axial series 3, image 82). There is mild apparent backing out of the far anterior medial malleolar screw by approximately 6 mm (coronal series 6, image 59). This also seen on today's radiographs. There is improvement of the tibiotalar alignment compared to preoperative 05/03/2023 CT. There is mild widening of the anterior lateral aspect of the tibiotalar joint (coronal series 6, image 61). Lateral plate and screw fixation of the distal fibula. Persistent lucency within oblique distal fibular diaphyseal metaphyseal fracture. Oblique, mildly comminuted posterior malleolar fracture with mild improved alignment and up to 3 mm AP dimension diastasis (sagittal series 8, image 29). Mild-to-moderate posterior and mild plantar calcaneal heel spurs. 2 mm bone density dorsal to the talar neck (sagittal series 8, image 37) is unchanged from 05/03/2023, likely age-indeterminate avulsion injury. Ligaments Suboptimally assessed by CT. Muscles and Tendons Normal size and density of the regional musculature. No gross tendon tear is seen. Soft tissues Mild-to-moderate soft tissue swelling of the medial and lateral ankle and dorsal midfoot IMPRESSION: 1. Interval hook plate and screw fixation of the previously seen displaced and angulated medial malleolar fracture. There is 7 mm of medial displacement of the superior aspect of the anterior distal medial malleolar fracture fragment. There is mild apparent backing out of the associated far anterior medial malleolar screw by approximately 6 mm. This also seen on today's radiographs. 2. Lateral plate and screw fixation of the distal fibula. Persistent lucency within oblique distal fibular diaphyseal metaphyseal  fracture. 3. Oblique, mildly comminuted posterior malleolar fracture with mild improved alignment and up to 3 mm AP dimension diastasis. 4. There is a 2 mm bone density dorsal to the talar neck is unchanged from 05/03/2023, likely an age-indeterminate avulsion injury. Electronically Signed   By: Neita Garnet M.D.   On: 05/12/2023 12:32   DG Ankle Complete Right  Result Date: 05/12/2023 Please see detailed radiograph report in office  note.   EKG: INone  Assessment/Plan Principal Problem:   Closed right ankle fracture  (please populate well all problems here in Problem List. (For example, if patient is on BP meds at home and you resume or decide to hold them, it is a problem that needs to be her. Same for CAD, COPD, HLD and so on)  Nonclearing and displacement right ankle fracture -Despite initial ORIF on 11/16.  Podiatrist Dr. Teresita Madura plans to take patient back to OR for revision tomorrow.  N.p.o. after midnight  IIDM with peripheral neuropathy -SSI -Continue gabapentin  Mild intermittent asthma -No symptoms of flareup, continue as needed inhaler and singular  Bipolar disorder -Mentation at baseline, continue current psychiatry medication  Morbid obesity -BMI= 38. -Nonweightbearing now  DVT prophylaxis: Lovenox Code Status: Full code Family Communication: None at bedside Disposition Plan: Patient is sick with right ankle fracture failed outpatient management, requiring inpatient podiatry management, expect more than 2 midnight hospital stay Consults called: Podiatry Dr. Lilian Kapur Admission status: MedSurg admission  Emeline General MD [facility] Pager [phone]  05/12/2023, 3:55 PM

## 2023-05-12 NOTE — Progress Notes (Addendum)
  Subjective:  Patient ID: Andrea [name], female    DOB: 19-Aug-1980,  MRN: [id number]  Chief Complaint  Patient presents with   Routine Post Op    POV#1 DOS 05/03/2023 OPEN REDUCTION INTERNAL FIXATION (ORIF) ANKLE FRACTURE RT "Some days are good, some are bad.  Today, my pain level is about a 69."     42 y.o. female returns for post-op check.  She presents using her walker weightbearing on the splint.  Says she has been trying to stay off of it as best she can but has put some weight on it.  Denies any falls or injuries.  Has a knee scooter ordered but is not getting it until today.  Review of Systems: Negative except as noted in the HPI. Denies N/V/F/Ch.   Objective:  There were no vitals filed for this visit. There is no height or weight on file to calculate BMI. Constitutional Well developed. Well nourished.  Vascular Foot warm and well perfused. Capillary refill normal to all digits.  Calf is soft and supple, no posterior calf or knee pain, negative Homans' sign  Neurologic Normal speech. Oriented to person, place, and time. Epicritic sensation to light touch grossly present bilaterally.  Dermatologic Staples are intact and remain coapted there are no signs of infection.  There is ecchymosis throughout the lower extremity.  Dressings around splint show signs of weightbearing and wear  Orthopedic: Tenderness to palpation noted about the surgical site.  Moderate edema   Multiple view plain film radiographs: Postoperative films taken today show loss of reduction of the medial malleolus with hardware failure, ankle and mild varus Assessment:   1. Status post ORIF of fracture of ankle    Plan:  Patient was evaluated and treated and all questions answered.  S/p foot surgery right -Unfortunately she has been weightbearing on the posterior and saddle splint using her walker with full body weight.  This is led to displacement of the medial malleolus and loss of the correction and  ankle mortise alignment.  Discussed with her this likely will need revision versus arthrodesis, with her weight and neuropathy this has been difficult for her although she was cleared by therapy for discharge to home and when I discussed with her prior to her discharge she was confident she would be able to maintain this safely.  I recommended return to the ER today and she will need admission and we will plan for revision or fusion, case discussed with my partner Dr. Annamary Rummage our on-call provider and he will see her today if she is present there while he is still at the hospital.  Splint was reapplied, the fiberglass is intact and new sterile Xeroform and dry dressings were applied after application of Betadine to the incisions.  No follow-ups on file.

## 2023-05-12 NOTE — Progress Notes (Signed)
PHARMACIST - PHYSICIAN COMMUNICATION  CONCERNING:  Enoxaparin (Lovenox) for DVT Prophylaxis    RECOMMENDATION: Patient was prescribed enoxaprin 40mg  q24 hours for VTE prophylaxis.   Filed Weights   05/12/23 1101  Weight: 124.7 kg (275 lb)    Body mass index is 38.35 kg/m.  Estimated Creatinine Clearance: 112.5 mL/min (by C-G formula based on SCr of 0.95 mg/dL).   Based on [facility] policy patient is candidate for enoxaparin 0.5mg /kg TBW SQ every 24 hours based on BMI being >30.   DESCRIPTION: Pharmacy has adjusted enoxaparin dose per [facility] policy.  Patient is now receiving enoxaparin 62.5 mg every 24 hours    Merryl Hacker, PharmD Clinical Pharmacist  05/12/2023 3:20 PM

## 2023-05-12 NOTE — ED Triage Notes (Signed)
Pt had ankle surgery on 11/16 by Dr Lilian Kapur, states that she went for f/u xray today and was told to come to ED for CT of ankle, "possible crushed".

## 2023-05-13 ENCOUNTER — Other Ambulatory Visit: Payer: Self-pay

## 2023-05-13 ENCOUNTER — Other Ambulatory Visit (HOSPITAL_COMMUNITY): Payer: Self-pay | Admitting: Psychiatry

## 2023-05-13 ENCOUNTER — Encounter: Payer: Self-pay | Admitting: Internal Medicine

## 2023-05-13 ENCOUNTER — Inpatient Hospital Stay: Payer: BC Managed Care – PPO

## 2023-05-13 ENCOUNTER — Inpatient Hospital Stay: Payer: BC Managed Care – PPO | Admitting: Anesthesiology

## 2023-05-13 ENCOUNTER — Encounter
Admission: EM | Disposition: A | Discharge status: Skilled Nursing Facility | Payer: Self-pay | Source: Home / Self Care | Attending: Internal Medicine

## 2023-05-13 DIAGNOSIS — M21171 Varus deformity, not elsewhere classified, right ankle: Secondary | ICD-10-CM

## 2023-05-13 DIAGNOSIS — S82891S Other fracture of right lower leg, sequela: Secondary | ICD-10-CM | POA: Diagnosis not present

## 2023-05-13 DIAGNOSIS — F410 Panic disorder [episodic paroxysmal anxiety] without agoraphobia: Secondary | ICD-10-CM

## 2023-05-13 DIAGNOSIS — M9689 Other intraoperative and postprocedural complications and disorders of the musculoskeletal system: Secondary | ICD-10-CM | POA: Diagnosis not present

## 2023-05-13 DIAGNOSIS — G4709 Other insomnia: Secondary | ICD-10-CM

## 2023-05-13 HISTORY — PX: ANKLE FUSION: SHX881

## 2023-05-13 LAB — GLUCOSE, CAPILLARY
Glucose-Capillary: 137 mg/dL — ABNORMAL HIGH (ref 70–99)
Glucose-Capillary: 110 mg/dL — ABNORMAL HIGH (ref 70–99)
Glucose-Capillary: 207 mg/dL — ABNORMAL HIGH (ref 70–99)
Glucose-Capillary: 190 mg/dL — ABNORMAL HIGH (ref 70–99)
Glucose-Capillary: 232 mg/dL — ABNORMAL HIGH (ref 70–99)

## 2023-05-13 LAB — SURGICAL PCR SCREEN
MRSA, PCR: NEGATIVE
Staphylococcus aureus: NEGATIVE

## 2023-05-13 SURGERY — ARTHRODESIS ANKLE
Anesthesia: General | Site: Ankle | Laterality: Right

## 2023-05-13 MED ORDER — ONDANSETRON HCL 4 MG/2ML IJ SOLN
INTRAMUSCULAR | Status: AC
  Filled 2023-05-13: qty 2

## 2023-05-13 MED ORDER — PROPOFOL 10 MG/ML IV BOLUS
INTRAVENOUS | Status: DC | PRN
  Administered 2023-05-13: 200 mg via INTRAVENOUS

## 2023-05-13 MED ORDER — LIDOCAINE HCL (PF) 2 % IJ SOLN
INTRAMUSCULAR | Status: AC
  Filled 2023-05-13: qty 5

## 2023-05-13 MED ORDER — 0.9 % SODIUM CHLORIDE (POUR BTL) OPTIME
TOPICAL | Status: DC | PRN
  Administered 2023-05-13: 500 mL

## 2023-05-13 MED ORDER — LABETALOL HCL 5 MG/ML IV SOLN
10.0000 mg | Freq: Once | INTRAVENOUS | Status: AC
  Administered 2023-05-13: 10 mg via INTRAVENOUS

## 2023-05-13 MED ORDER — ACETAMINOPHEN 10 MG/ML IV SOLN: 1000.0000 mg | Freq: Once | INTRAVENOUS | Status: DC | PRN

## 2023-05-13 MED ORDER — BUPIVACAINE HCL (PF) 0.5 % IJ SOLN
INTRAMUSCULAR | Status: AC
  Filled 2023-05-13: qty 30

## 2023-05-13 MED ORDER — MUPIROCIN 2 % EX OINT
1.0000 | TOPICAL_OINTMENT | Freq: Two times a day (BID) | CUTANEOUS | Status: DC
  Filled 2023-05-13: qty 22

## 2023-05-13 MED ORDER — DEXAMETHASONE SODIUM PHOSPHATE 10 MG/ML IJ SOLN
INTRAMUSCULAR | Status: AC
  Filled 2023-05-13: qty 1

## 2023-05-13 MED ORDER — PROPOFOL 10 MG/ML IV BOLUS
INTRAVENOUS | Status: AC
  Filled 2023-05-13: qty 20

## 2023-05-13 MED ORDER — MIDAZOLAM HCL 2 MG/2ML IJ SOLN
INTRAMUSCULAR | Status: DC | PRN
  Administered 2023-05-13: 2 mg via INTRAVENOUS

## 2023-05-13 MED ORDER — MIDAZOLAM HCL 2 MG/2ML IJ SOLN
INTRAMUSCULAR | Status: AC
Start: 2023-05-13 — End: ?
  Filled 2023-05-13: qty 2

## 2023-05-13 MED ORDER — FENTANYL CITRATE (PF) 100 MCG/2ML IJ SOLN
INTRAMUSCULAR | Status: AC
  Filled 2023-05-13: qty 2

## 2023-05-13 MED ORDER — SUGAMMADEX SODIUM 200 MG/2ML IV SOLN
INTRAVENOUS | Status: DC | PRN
  Administered 2023-05-13: 400 mg via INTRAVENOUS

## 2023-05-13 MED ORDER — HYDROMORPHONE HCL 1 MG/ML IJ SOLN
INTRAMUSCULAR | Status: AC
  Filled 2023-05-13: qty 1

## 2023-05-13 MED ORDER — ACETAMINOPHEN 10 MG/ML IV SOLN
INTRAVENOUS | Status: AC
  Filled 2023-05-13: qty 100

## 2023-05-13 MED ORDER — ONDANSETRON HCL 4 MG/2ML IJ SOLN
INTRAMUSCULAR | Status: DC | PRN
  Administered 2023-05-13: 4 mg via INTRAVENOUS

## 2023-05-13 MED ORDER — DEXAMETHASONE SODIUM PHOSPHATE 10 MG/ML IJ SOLN
INTRAMUSCULAR | Status: DC | PRN
  Administered 2023-05-13: 10 mg via INTRAVENOUS

## 2023-05-13 MED ORDER — DEXTROSE 5 % IV SOLN
INTRAVENOUS | Status: DC | PRN
  Administered 2023-05-13: 3 g via INTRAVENOUS

## 2023-05-13 MED ORDER — ROCURONIUM BROMIDE 100 MG/10ML IV SOLN
INTRAVENOUS | Status: DC | PRN
  Administered 2023-05-13: 20 mg via INTRAVENOUS
  Administered 2023-05-13: 30 mg via INTRAVENOUS
  Administered 2023-05-13: 50 mg via INTRAVENOUS
  Administered 2023-05-13 (×2): 20 mg via INTRAVENOUS

## 2023-05-13 MED ORDER — DROPERIDOL 2.5 MG/ML IJ SOLN: 0.6250 mg | Freq: Once | INTRAMUSCULAR | Status: DC | PRN

## 2023-05-13 MED ORDER — ROCURONIUM BROMIDE 10 MG/ML (PF) SYRINGE
PREFILLED_SYRINGE | INTRAVENOUS | Status: AC
  Filled 2023-05-13: qty 10

## 2023-05-13 MED ORDER — LIDOCAINE HCL (CARDIAC) PF 100 MG/5ML IV SOSY
PREFILLED_SYRINGE | INTRAVENOUS | Status: DC | PRN
  Administered 2023-05-13: 100 mg via INTRAVENOUS

## 2023-05-13 MED ORDER — FENTANYL CITRATE (PF) 100 MCG/2ML IJ SOLN
INTRAMUSCULAR | Status: DC | PRN
  Administered 2023-05-13 (×2): 50 ug via INTRAVENOUS

## 2023-05-13 MED ORDER — SEVOFLURANE IN SOLN
RESPIRATORY_TRACT | Status: AC
  Filled 2023-05-13: qty 250

## 2023-05-13 MED ORDER — BUPIVACAINE LIPOSOME 1.3 % IJ SUSP
INTRAMUSCULAR | Status: AC
  Filled 2023-05-13: qty 10

## 2023-05-13 MED ORDER — HYDROMORPHONE HCL 1 MG/ML IJ SOLN: 0.2500 mg | INTRAMUSCULAR | Status: DC | PRN

## 2023-05-13 MED ORDER — CEFAZOLIN SODIUM-DEXTROSE 2-4 GM/100ML-% IV SOLN
2.0000 g | Freq: Three times a day (TID) | INTRAVENOUS | Status: DC
  Administered 2023-05-13 – 2023-05-17 (×11): 2 g via INTRAVENOUS
  Filled 2023-05-13 (×11): qty 100

## 2023-05-13 MED ORDER — LABETALOL HCL 5 MG/ML IV SOLN
INTRAVENOUS | Status: AC
  Filled 2023-05-13: qty 4

## 2023-05-13 MED ORDER — ACETAMINOPHEN 10 MG/ML IV SOLN
INTRAVENOUS | Status: DC | PRN
  Administered 2023-05-13: 1000 mg via INTRAVENOUS

## 2023-05-13 MED ORDER — SODIUM CHLORIDE 0.9 % IV SOLN: INTRAVENOUS | Status: DC | PRN

## 2023-05-13 MED ORDER — ACETAMINOPHEN 500 MG PO TABS: 1000.0000 mg | ORAL_TABLET | Freq: Once | ORAL | Status: DC

## 2023-05-13 SURGICAL SUPPLY — 72 items
3.0 x 370 guidewire IMPLANT
3.0 x 600  guidewire IMPLANT
4.2 drill bit IMPLANT
8.5 entry reamer IMPLANT
BIT DRILL 4.2 HINDFOOT (BIT) IMPLANT
BLADE MED AGGRESSIVE (BLADE) IMPLANT
BLADE SURG 10 STRL SS SAFETY (BLADE) ×1 IMPLANT
BNDG ELASTIC 4INX 5YD STR LF (GAUZE/BANDAGES/DRESSINGS) ×1 IMPLANT
BNDG ELASTIC 4X5.8 VLCR NS LF (GAUZE/BANDAGES/DRESSINGS) ×1 IMPLANT
BNDG ELASTIC 6X5.8 VLCR NS LF (GAUZE/BANDAGES/DRESSINGS) ×1 IMPLANT
BNDG ESMARCH 4X12 STRL LF (GAUZE/BANDAGES/DRESSINGS) ×1 IMPLANT
BUR 4X45 EGG (BURR) IMPLANT
CHLORAPREP W/TINT 26 (MISCELLANEOUS) ×1 IMPLANT
CUFF TRNQT CYL 30X4X21-28X (TOURNIQUET CUFF) IMPLANT
DRAPE C-ARM 42X70 (DRAPES) ×1 IMPLANT
DRAPE C-ARMOR (DRAPES) ×1 IMPLANT
DRILL BIT 4.2MM HINDFOOT (BIT) ×1
ELECT REM PT RETURN 9FT ADLT (ELECTROSURGICAL) ×1
ELECTRODE REM PT RTRN 9FT ADLT (ELECTROSURGICAL) ×1 IMPLANT
GAUZE SPONGE 4X4 12PLY STRL (GAUZE/BANDAGES/DRESSINGS) IMPLANT
GAUZE XEROFORM 1X8 LF (GAUZE/BANDAGES/DRESSINGS) ×1 IMPLANT
GLOVE BIOGEL M STRL SZ7.5 (GLOVE) ×1 IMPLANT
GLOVE BIOGEL PI IND STRL 7.5 (GLOVE) ×1 IMPLANT
GLOVE PI ORTHO PRO STRL 7.5 (GLOVE) ×1 IMPLANT
GLOVE SKINSENSE STRL SZ7.5 (GLOVE) ×1 IMPLANT
GOWN STRL REUS W/ TWL LRG LVL3 (GOWN DISPOSABLE) ×1 IMPLANT
GUIDEWIRE 2.4 HINDFOOT (WIRE) ×1
GUIDEWIRE 3.0X370 HINDFOOT (WIRE) IMPLANT
GUIDEWIRE 3.0X370MM HINDFOOT (WIRE) ×1
GUIDEWIRE 3.0X600 HINDFOOT (WIRE) IMPLANT
GUIDEWIRE 3.0X600MM HINDFOOT (WIRE) ×1
GUIDEWIRE W/TROCAR TIP .094X8 (WIRE) IMPLANT
KIT TURNOVER KIT A (KITS) ×1 IMPLANT
LABEL OR SOLS (LABEL) ×1 IMPLANT
MANIFOLD NEPTUNE II (INSTRUMENTS) ×1 IMPLANT
NAIL HINDFOOT 11.5X210 (Nail) IMPLANT
NAIL HINDFOOT 11.5X210MM (Nail) ×1 IMPLANT
NDL HYPO 22X1.5 SAFETY MO (MISCELLANEOUS) ×1 IMPLANT
NEEDLE HYPO 22X1.5 SAFETY MO (MISCELLANEOUS) ×1 IMPLANT
NS IRRIG 500ML POUR BTL (IV SOLUTION) ×1 IMPLANT
PACK EXTREMITY ARMC (MISCELLANEOUS) ×1 IMPLANT
PAD ABD DERMACEA PRESS 5X9 (GAUZE/BANDAGES/DRESSINGS) IMPLANT
PAD PREP OB/GYN DISP 24X41 (PERSONAL CARE ITEMS) ×1 IMPLANT
PADDING CAST BLEND 4X4 NS (MISCELLANEOUS) ×2 IMPLANT
REAMER ENTRY 8.5 HINDFOOT (INSTRUMENTS) IMPLANT
REAMER ENTRY 8.5MM HINDFOOT (INSTRUMENTS) ×1
REMOVER STAPLE SKIN (DISPOSABLE) IMPLANT
SCREW 5.0X26 HINDFOOT (Screw) IMPLANT
SCREW 5.0X26MM HINDFOOT (Screw) ×1 IMPLANT
SCREW 5.0X28 HINDFOOT (Screw) IMPLANT
SCREW 5.0X28MM HINDFOOT (Screw) ×1 IMPLANT
SCREW 5.0X32 HINDFOOT (Screw) IMPLANT
SCREW 5.0X32MM HINDFOOT (Screw) ×1 IMPLANT
SCREW 5.0X55 HEADLESS HDFOOT (Screw) IMPLANT
SCREW 5.0X55MM HEADLESS HDFOOT (Screw) ×1 IMPLANT
SCREW 5.0X70 HEADLESS HDFOOT (Screw) IMPLANT
SCREW 5.0X70MM HEADLESS HDFOOT (Screw) ×1 IMPLANT
SPLINT CAST 1 STEP 4X30 (MISCELLANEOUS) ×2 IMPLANT
SPONGE T-LAP 18X18 ~~LOC~~+RFID (SPONGE) ×1 IMPLANT
STAPLER SKIN PROX 35W (STAPLE) IMPLANT
STOCKINETTE M/LG 89821 (MISCELLANEOUS) ×1 IMPLANT
STOCKINETTE ORTHO 4X25 (MISCELLANEOUS) IMPLANT
STOCKINETTE ORTHO 6X25 (MISCELLANEOUS) IMPLANT
STRAP SAFETY 5IN WIDE (MISCELLANEOUS) ×1 IMPLANT
SUCTION TUBE FRAZIER 10FR DISP (SUCTIONS) IMPLANT
SUT ETHILON 3-0 FS-10 30 BLK (SUTURE) ×1
SUT MNCRL 3-0 UNDYED SH (SUTURE) IMPLANT
SUT MNCRL AB 3-0 PS2 27 (SUTURE) ×1 IMPLANT
SUT PROLENE 4 0 PS 2 18 (SUTURE) ×1 IMPLANT
SUTURE EHLN 3-0 FS-10 30 BLK (SUTURE) ×1 IMPLANT
SYR 10ML LL (SYRINGE) ×1 IMPLANT
TRAP FLUID SMOKE EVACUATOR (MISCELLANEOUS) ×1 IMPLANT

## 2023-05-13 NOTE — Op Note (Signed)
Full Operative Report  Date of Operation: 5:10 PM, 05/13/2023   Patient: Andrea [name] - 42 y.o. female  Surgeon: Pilar Plate, DPM   Assistant: None  Diagnosis: Displaced ankle fracture, neuropathy, right  Procedure:  1.  Removal of deep hardware right ankle 2.  Tibiotalarcalcaneal arthrodesis, right ankle    Anesthesia: General  No responsible provider has been recorded for the case.  Anesthesiologist: Foye Deer, MD CRNA: Bynum, [place], CRNA; Michelet, Judeth Cornfield, CRNA   Estimated Blood Loss: 100 mL  Hemostasis: 1) Anatomical dissection, mechanical compression, electrocautery 2) thigh tourniquet right thigh 300 mmHg approximately 2 hours 15 minutes  Implants: Implant Name Type Inv. Item Serial No. Manufacturer Lot No. LRB No. Used Action  Cannulated screw      Right 1 Explanted  PLATE DISTAL FIB RT 6H - [id number] Plate PLATE DISTAL FIB RT 6H  [employer]  Right 1 Explanted  PLATE LOCK MED HOOK 3H - [id number] Plate PLATE LOCK MED HOOK 3H  [employer]  Right 1 Explanted  SCREW CAN THREAD 3X18 - [id number] Screw SCREW CAN THREAD 3X18  [employer]  Right 1 Explanted  SCREW CANCELLOUS 3X12MM - [id number] Screw SCREW CANCELLOUS 3X12MM  [employer]  Right 1 Explanted  SCREW CANCELLOUS 3X16MM - [id number] Screw SCREW CANCELLOUS 3X16MM  [employer]  Right 1 Explanted  SCREW COMP KREULOCK 2.7X14 - [id number] Screw SCREW COMP KREULOCK 2.7X14  [employer]  Right 1 Explanted  SCREW COMP KREULOCK 2.7X16 - [id number] Screw SCREW COMP KREULOCK 2.7X16  [employer]  Right 3 Explanted  SCREW COMP KREULOCK 3.5X14 - [id number] Screw SCREW COMP KREULOCK 3.5X14  [employer]  Right 2 Explanted  SCREW CORT 2.7X46 - [id number] Screw SCREW CORT 2.7X46  [employer]  Right 1 Explanted  SCREW CORT 2.7X50 - [id number] Screw SCREW CORT 2.7X50  [employer]  Right 1 Explanted  SCREW CORT 3.5X40 LP ANKLE - [id number] Screw SCREW CORT 3.5X40 LP ANKLE  [employer]  Right 1  Explanted  SCREW CORTEX 2.7X24MM LP SS - [id number] Screw SCREW CORTEX 2.7X24MM LP SS  [employer]  Right 1 Explanted  SCREW LO PRF TMSS 3.5X55 CORT - [id number] Screw SCREW LO PRF TMSS 3.5X55 CORT  [employer]  Right 1 Explanted  SCREW LOW PROFILE 3.5X14 - [id number] Screw SCREW LOW PROFILE 3.5X14  [employer]  Right 1 Explanted  SCREW LOW PROFILE 3.5X35 - [id number] Screw SCREW LOW PROFILE 3.5X35  [employer]  Right 1 Explanted  SCREW LP CANN 4.0X45MM - [id number] Screw SCREW LP CANN 4.0X45MM  [employer]  Right 1 Explanted  SCREW NLOCK CORT 3.5X50 NS - [id number] Screw SCREW NLOCK CORT 3.5X50 NS  [employer]  Right 1 Explanted  Dualcompression Hindfoot Nail 11.5 x 210    [employer] [id number] Right 1 Implanted  5.0 x 70 screw    [employer]  Right 1 Implanted  5.0 x 32 screw    [employer]  Right 1 Implanted and Explanted  5.0 x 28 screw    [employer]  Right 1 Implanted  5.0 x 55 screw    [employer]  Right 1 Implanted  5.0 x 26 screw    [employer]  Right 1 Implanted    Materials: 3-0 Monocryl, skin staples  Injectables: 1) Pre-operatively: None 2) Post-operatively: None   Specimens: Pathology: None microbiology: None   Antibiotics: IV antibiotics given per schedule on the floor  Drains: None  Complications: Patient tolerated the procedure well  without complication.   Operative findings: As below in detailed report  Indications for Procedure: Andrea [name] presents to Pilar Plate, DPM with a chief complaint of hardware failure, repeat ankle fracture with displacement and resultant varus ankle deformity after walking on her right ankle after open reduction internal fixation by my partner Dr. Lilian Kapur on 05/03/2023.  The patient has failed conservative treatments of various modalities. At this time the patient has elected to proceed with surgical correction. All alternatives, risks, and complications of the procedures were thoroughly explained to the patient.  Patient exhibits appropriate understanding of all discussion points and informed consent was signed and obtained in the chart with no guarantees to surgical outcome given or implied.  Description of Procedure: Patient was brought to the operating room.  Patient was transferred to the operative table and secured with straps.  Proper positioning was obtained in the supine position all bony were well-padded hip bump was placed.  A surgical timeout was performed and all members of the operating room, the procedure, and the surgical site were identified. anesthesia occurred as per anesthesia record in this case general anesthesia was performed. The operative lower extremity as noted above was then prepped and draped in the usual sterile manner.  Next Esmarch was applied to the right lower ankle and then the tourniquet was inflated to 300 mmHg the following procedure then began.  Attention was directed to the right ankle where the previous surgical incision was noted to the medial and lateral ankle. Using the previous incision on both sides, the prior surgical staples and deep sutures were removed.  Hemostat was used to bluntly dissect and open the incisions. This was deepened through subcutaneous tissues with sharp and blunt dissection and care was taken to protect all vital structures.  At this time, the hardware was well visualized and was removed without complication. Fluoroscopy was used to verify complete removal. The area was then flushed with copious amounts of sterile saline.  Attention was directed to the lateral aspect of the patient's right ankle. To gain access to the tibiotalar joint, the fibula was resected slightly proximal to the fracture.  The distal portion of the fibula was then resected this was done at an angle superolateral to inferomedial to prevent prominence after healing. The tibiotalar and subtalar joints had adequate exposure.  The joints were prepped using a combination of power bur  and curette.  Next the area was irrigated thoroughly.  Then the bone graft harvested from the distal fibula as well as the medial malleolus was placed in bone mill and ground down to provide bone graft which was placed in the arthrodesis site.  Next, the joints were positioned for fusion so that the ankle was neutral in the sagittal plane, 5-10 of external rotation in the transverse plane in relation to the tibial crest and second ray, and about 5 of hindfoot valgus. Fluoroscopy was used to confirm the proposed fusion position as well as placement of the k-wire. An entry point on the plantar calcaneus for the wire was made with a stab incision. Intraoperative calcaneal axial, AP, and lateral ankle views were used to continually aid in optimal hardware placement. The k-wire was retrograded through plantar calcaneus, through the talar body, and into the tibia. Reaming was then performed in 0.8mm increments until cortical contact was made within the tibia. Next, the [employer] dual compression hindfoot 11.5 x 210 length nail was inserted. Correct depth and rotation was verified with the aid of fluoroscopy. Next,  with the use of the target arm, locking screws were guided into place per manufacturer protocol and guidelines.  The first distal calcaneal screw was placed with the targeting arm in the posterior position.  Then with the target arm in the lateral locking position tibio-talar compression was achieved by placing a shaft screw in the proximal hole in the tibia. The target arm was rotated into the medial locking position and the proximal tibial screws were placed. A compression screwdriver was used and active apposition of the talus into the tibia was employed under fluoroscopy. Care was taken to not overcompress. The target arm was rotated into the posterior locking position and the 2nd calcaneal screw was placed.  This resulted with 2 screws in the calcaneus and 2 screws proximally in the tibia the target arm was  removed.  Fluoroscopy was used throughout the procedure to verify hardware placement. The surgical areas was then flushed with copious amounts of sterile saline. Then using the suture materials previously described, the site was closed in anatomic layers and the skin was well approximated under minimal tension.  The surgical site was then dressed with Xeroform 4 x 4 ABD pad copious amounts of synthetic cast padding and 4 x 30 synthetic fiberglass posterior splint was applied. The patient tolerated both the procedure and anesthesia well with vital signs stable throughout. The patient was transferred in good condition and all vital signs stable  from the OR to recovery under the discretion of anesthesia.  Condition: Vital signs stable, neurovascular status unchanged from preoperative   Surgical plan:  No evidence of infection or other surgical complication or other than hardware failure.  Significant fragmentation of the previously fixed ankle fracture.  Medial malleolus distal fibula and posterior malleolus were resected during the procedure.  These were used as bone graft.  TTC nail inserted.  Patient to remain nonweightbearing for next 6 to 8 weeks.  Recommend SNF placement.  Continue IV Ancef 2 g every 8 hours for 6 doses postoperative and then okay to transition to p.o. ABX x 10 days.  Dressing is to be changed this weekend by our on-call doctor, Dr. Jamse Arn who will change the posterior splint and reapply to evaluate for incisional necrosis or any other wound complications.  The patient will be nonweightbearing in a posterior splint to the operative limb until further instructed. The dressing is to remain clean, dry, and intact. Will continue to follow unless noted elsewhere.   Carlena Hurl, DPM [facility]

## 2023-05-13 NOTE — Progress Notes (Signed)
Progress Note    Andrea [name]  WUJ:[id number] DOB: 03-27-1981  DOA: 05/12/2023 PCP: Golden Pop, FNP      Brief Narrative:    Medical records reviewed and are as summarized below:  Andrea [name] is a 42 y.o. female with medical history significant for obesity, hypertension, bipolar disorder, schizophrenia, type II DM, recent right ankle fracture, who was referred from the podiatrist office to the emergency department because of nonhealing right ankle fracture.  She had a right ankle fracture 05/03/2023 and underwent reduction of right ankle fracture on 05/03/2023.      Assessment/Plan:   Principal Problem:   Closed right ankle fracture Active Problems:   Postoperative surgical complication involving musculoskeletal system associated with musculoskeletal procedure    Body mass index is 38.35 kg/m.  (Obesity)   Nonhealing right ankle fracture: Plan for right ankle surgery today.  Follow-up with podiatrist.  Analgesics as needed for pain.   Mild intermittent asthma: Bronchodilators as needed   Bipolar disorder, schizophrenia: Continue antipsychotics  Diet Order             Diet NPO time specified  Diet effective midnight                            Consultants: Podiatrist  Procedures: Plan for right ankle surgery    Medications:    DULoxetine  120 mg Oral Daily   famotidine  40 mg Oral BID   gabapentin  300 mg Oral BID WC   gabapentin  900 mg Oral QHS   Galcanezumab-gnlm  1 Pen Subcutaneous Q30 days   insulin aspart  0-15 Units Subcutaneous TID WC   insulin aspart  0-5 Units Subcutaneous QHS   loratadine  10 mg Oral QHS   mirabegron ER  25 mg Oral Daily   mometasone-formoterol  2 puff Inhalation BID   montelukast  10 mg Oral Daily   NON FORMULARY 30 mg  30 mg Oral QHS   pantoprazole  40 mg Oral q morning   prazosin  4 mg Oral QHS   QUEtiapine  100 mg Oral QHS   QUEtiapine  50 mg Oral BID   traZODone  150 mg  Oral QHS   zonisamide  500 mg Oral QHS   Continuous Infusions:   Anti-infectives (From admission, onward)    None              Family Communication/Anticipated D/C date and plan/Code Status   DVT prophylaxis:      Code Status: Full Code  Family Communication: None Disposition Plan: Plan to discharge home   Status is: Inpatient Remains inpatient appropriate because: Needs right ankle surgery       Subjective:   Interval events noted.  Pain in the right ankle is better because she had just received "pain medicine".  No other complaints.  Objective:    Vitals:   05/12/23 1700 05/12/23 1800 05/12/23 2008 05/12/23 2333  BP: (!) 111/57 123/88 124/88 109/76  Pulse: 77 73 78 78  Resp: 18  20 20   Temp: 98.2 F (36.8 C)  98.2 F (36.8 C) 97.9 F (36.6 C)  TempSrc: Oral  Oral Oral  SpO2: 97% 97% 96% 94%  Weight:      Height:       No data found.  No intake or output data in the 24 hours ending 05/13/23 0920 Filed Weights   05/12/23 1101  Weight: 124.7  kg    Exam:  GEN: NAD SKIN: Warm and dry EYES: No pallor or icterus ENT: MMM CV: RRR PULM: CTA B ABD: soft, obese, NT, +BS CNS: AAO x 3, non focal EXT: Cam boot on right foot        Data Reviewed:   I have personally reviewed following labs and imaging studies:  Labs: Labs show the following:   Basic Metabolic Panel: Recent Labs  Lab 05/12/23 1329  NA 136  K 3.6  CL 102  CO2 23  GLUCOSE 138*  BUN 27*  CREATININE 0.95  CALCIUM 9.3   GFR Estimated Creatinine Clearance: 112.5 mL/min (by C-G formula based on SCr of 0.95 mg/dL). Liver Function Tests: No results for input(s): "AST", "ALT", "ALKPHOS", "BILITOT", "PROT", "ALBUMIN" in the last 168 hours. No results for input(s): "LIPASE", "AMYLASE" in the last 168 hours. No results for input(s): "AMMONIA" in the last 168 hours. Coagulation profile No results for input(s): "INR", "PROTIME" in the last 168 hours.  CBC: Recent  Labs  Lab 05/12/23 1329  WBC 9.7  NEUTROABS 6.2  HGB 12.3  HCT 38.5  MCV 93.7  PLT 251   Cardiac Enzymes: No results for input(s): "CKTOTAL", "CKMB", "CKMBINDEX", "TROPONINI" in the last 168 hours. BNP (last 3 results) No results for input(s): "PROBNP" in the last 8760 hours. CBG: Recent Labs  Lab 05/12/23 1636 05/12/23 2014 05/13/23 0741  GLUCAP 139* 105* 137*   D-Dimer: No results for input(s): "DDIMER" in the last 72 hours. Hgb A1c: No results for input(s): "HGBA1C" in the last 72 hours. Lipid Profile: No results for input(s): "CHOL", "HDL", "LDLCALC", "TRIG", "CHOLHDL", "LDLDIRECT" in the last 72 hours. Thyroid function studies: No results for input(s): "TSH", "T4TOTAL", "T3FREE", "THYROIDAB" in the last 72 hours.  Invalid input(s): "FREET3" Anemia work up: No results for input(s): "VITAMINB12", "FOLATE", "FERRITIN", "TIBC", "IRON", "RETICCTPCT" in the last 72 hours. Sepsis Labs: Recent Labs  Lab 05/12/23 1329  WBC 9.7    Microbiology Recent Results (from the past 240 hour(s))  Surgical PCR screen     Status: None   Collection Time: 05/13/23  7:07 AM   Specimen: Nasal Mucosa; Nasal Swab  Result Value Ref Range Status   MRSA, PCR NEGATIVE NEGATIVE Final   Staphylococcus aureus NEGATIVE NEGATIVE Final    Comment: (NOTE) The Xpert SA Assay (FDA approved for NASAL specimens in patients 27 years of age and older), is one component of a comprehensive surveillance program. It is not intended to diagnose infection nor to guide or monitor treatment. Performed at [facility], [address], [place], [place] [address]     Procedures and diagnostic studies:  CT Ankle Right Wo Contrast  Result Date: 05/12/2023 CLINICAL DATA:  Ankle trauma. Follow-up fracture. Status post ORIF 05/03/2023 orthopedic surgeon note describes today's x-rays show repeat fracture of the medial malleolus and pull out of hardware with ankle and 8-10 degrees of varus.  Preoperative CT scan for revision versus removal and fusion right ankle pending results. EXAM: CT OF THE RIGHT ANKLE WITHOUT CONTRAST TECHNIQUE: Multidetector CT imaging of the right ankle was performed according to the standard protocol. Multiplanar CT image reconstructions were also generated. RADIATION DOSE REDUCTION: This exam was performed according to the departmental dose-optimization program which includes automated exposure control, adjustment of the mA and/or kV according to patient size and/or use of iterative reconstruction technique. COMPARISON:  Right ankle radiographs 05/12/2023, intraoperative fluoroscopy 05/03/2023, right ankle radiographs 05/03/2023; CT right ankle 05/03/2023 FINDINGS: Bones/Joint/Cartilage Interval hook  plate and screw fixation of the previously seen displaced and angulated medial malleolar fracture. There is 7 mm of medial displacement of the superior aspect of the anterior distal medial malleolar fracture fragment (coronal series 6, image 58, axial series 3, image 82). There is mild apparent backing out of the far anterior medial malleolar screw by approximately 6 mm (coronal series 6, image 59). This also seen on today's radiographs. There is improvement of the tibiotalar alignment compared to preoperative 05/03/2023 CT. There is mild widening of the anterior lateral aspect of the tibiotalar joint (coronal series 6, image 61). Lateral plate and screw fixation of the distal fibula. Persistent lucency within oblique distal fibular diaphyseal metaphyseal fracture. Oblique, mildly comminuted posterior malleolar fracture with mild improved alignment and up to 3 mm AP dimension diastasis (sagittal series 8, image 29). Mild-to-moderate posterior and mild plantar calcaneal heel spurs. 2 mm bone density dorsal to the talar neck (sagittal series 8, image 37) is unchanged from 05/03/2023, likely age-indeterminate avulsion injury. Ligaments Suboptimally assessed by CT. Muscles and Tendons  Normal size and density of the regional musculature. No gross tendon tear is seen. Soft tissues Mild-to-moderate soft tissue swelling of the medial and lateral ankle and dorsal midfoot IMPRESSION: 1. Interval hook plate and screw fixation of the previously seen displaced and angulated medial malleolar fracture. There is 7 mm of medial displacement of the superior aspect of the anterior distal medial malleolar fracture fragment. There is mild apparent backing out of the associated far anterior medial malleolar screw by approximately 6 mm. This also seen on today's radiographs. 2. Lateral plate and screw fixation of the distal fibula. Persistent lucency within oblique distal fibular diaphyseal metaphyseal fracture. 3. Oblique, mildly comminuted posterior malleolar fracture with mild improved alignment and up to 3 mm AP dimension diastasis. 4. There is a 2 mm bone density dorsal to the talar neck is unchanged from 05/03/2023, likely an age-indeterminate avulsion injury. Electronically Signed   By: Neita Garnet M.D.   On: 05/12/2023 12:32   DG Ankle Complete Right  Result Date: 05/12/2023 Please see detailed radiograph report in office note.              LOS: 1 day   Cattie Tineo  [facility]   Pager on [email or web address]. If 7PM-7AM, please contact night-coverage at [email or web address]     05/13/2023, 9:20 AM

## 2023-05-13 NOTE — Progress Notes (Signed)
History and Physical Interval Note:  05/13/2023 1:04 PM  Andrea [name]  has presented today for surgery, with the diagnosis of re fracture of right ankle with varus deformity and hardware failure in patient with Dm2 with neuropathy.  The various methods of treatment have been discussed with the patient and family. After consideration of risks, benefits and other options for treatment, the patient has consented to   Procedure(s) with comments: ARTHRODESIS ANKLE (Right) - Hardware removal, right ankle tibiot talo calcaneal arthrodesis as a surgical intervention.  The patient's history has been reviewed, patient examined, no change in status, stable for surgery.  I have reviewed the patient's chart and labs.  Questions were answered to the patient's satisfaction.     Jenelle Mages Bernard Slayden

## 2023-05-13 NOTE — Anesthesia Procedure Notes (Signed)
Procedure Name: Intubation Date/Time: 05/13/2023 1:31 PM  Performed by: Jaspreet Bodner, [place], CRNAPre-anesthesia Checklist: Patient identified, Patient being monitored, Timeout performed, Emergency Drugs available and Suction available Patient Re-evaluated:Patient Re-evaluated prior to induction Oxygen Delivery Method: Circle system utilized Preoxygenation: Pre-oxygenation with 100% oxygen Induction Type: IV induction Ventilation: Mask ventilation without difficulty Laryngoscope Size: McGrath and 3 Grade View: Grade I Tube type: Oral Tube size: 7.0 mm Number of attempts: 1 Airway Equipment and Method: Stylet Placement Confirmation: ETT inserted through vocal cords under direct vision, positive ETCO2 and breath sounds checked- equal and bilateral Secured at: 21 cm Tube secured with: Tape Dental Injury: Teeth and Oropharynx as per pre-operative assessment

## 2023-05-13 NOTE — Brief Op Note (Signed)
05/13/2023  5:09 PM  PATIENT:  Andrea [name]  42 y.o. female  PRE-OPERATIVE DIAGNOSIS:  Displaced ankle fracture, neuropathy, right  POST-OPERATIVE DIAGNOSIS:  Displaced ankle fracture, neuropathy, right  PROCEDURE:  Procedure(s) with comments: ARTHRODESIS ANKLE (Right) - Hardware removal, right ankle tibiot talo calcaneal arthrodesis  SURGEON:  Surgeons and Role:    * Maitland Muhlbauer, Jenelle Mages, DPM - Primary  PHYSICIAN ASSISTANT:   ASSISTANTS: Surgical first assist   ANESTHESIA:   general  EBL:  100 mL   BLOOD ADMINISTERED:none  DRAINS: none   LOCAL MEDICATIONS USED:  NONE - Post op block per anesthesia  SPECIMEN:  No Specimen  DISPOSITION OF SPECIMEN:  N/A  COUNTS:  YES  TOURNIQUET:   Total Tourniquet Time Documented: Thigh (Right) - 135 minutes Total: Thigh (Right) - 135 minutes   DICTATION: .Note written in EPIC  PLAN OF CARE: Admit to inpatient   PATIENT DISPOSITION:  PACU - hemodynamically stable.   Delay start of Pharmacological VTE agent (>24hrs) due to surgical blood loss or risk of bleeding: no

## 2023-05-13 NOTE — Transfer of Care (Signed)
Immediate Anesthesia Transfer of Care Note  Patient: LYNEA STAATS  Procedure(s) Performed: ARTHRODESIS ANKLE (Right: Ankle)  Patient Location: PACU  Anesthesia Type:General  Level of Consciousness: sedated and drowsy  Airway & Oxygen Therapy: Patient Spontanous Breathing and Patient connected to face mask oxygen  Post-op Assessment: Report given to RN and Post -op Vital signs reviewed and stable  Post vital signs: Reviewed and stable  Last Vitals:  Vitals Value Taken Time  BP 165/84 05/13/23 1654  Temp    Pulse 105 05/13/23 1654  Resp 16 05/13/23 1654  SpO2 98 % 05/13/23 1654  Vitals shown include unfiled device data.  Last Pain:  Vitals:   05/13/23 1129  TempSrc: Temporal  PainSc: 5          Complications: No notable events documented.

## 2023-05-13 NOTE — Anesthesia Preprocedure Evaluation (Addendum)
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 Her ankle and foot are in a cast. A nerve block was discussed and can be provided in the pacu as needed for severe pain with the understanding that a nerve block on an injured nerve may further delay healing.   Past Medical History: No date: Anxiety No date: Bipolar 1 disorder (HCC) 12/20/2016: Bipolar 1 disorder, mixed, severe (HCC) 12/20/2016: Bipolar I disorder, most recent episode depressed (HCC) No date: Complication of anesthesia     Comment:  hard time waking up  No date: Depression No date: Diabetes mellitus without complication (HCC) 04/02/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 No date: GERD (gastroesophageal reflux disease)     Comment:  no meds No date: Kidney stones 03/13/2022: Long term current use of antipsychotic medication No date: Low back pain No date: Migraines No date: PCOS (polycystic ovarian syndrome) No date: PONV (postoperative nausea and vomiting) No date: Schizophrenia (HCC) 10/03/2021: Seizure  (HCC) 06/04/2016: Seizures (HCC)     Comment:  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[facility] 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 No date: Shortness of breath dyspnea     Comment:  with bronchitis  Past Surgical History: 11/14/2015: ABDOMINAL HYSTERECTOMY; N/A     Comment:  Procedure: HYSTERECTOMY ABDOMINAL;  Surgeon: Levi Aland, MD;  Location: [facility];  Service: Gynecology;                Laterality: N/A; No date: CHOLECYSTECTOMY 06/14/2014: INTRAUTERINE DEVICE (IUD) INSERTION     Comment:  [facility] No date: LYMPH NODES REMOVED 05/03/2023: ORIF ANKLE FRACTURE; Right     Comment:  Procedure: OPEN REDUCTION INTERNAL FIXATION (ORIF) ANKLE              FRACTURE;  Surgeon: Edwin Cap, DPM;  Location:               [facility];  Service: Orthopedics/Podiatry;  Laterality:               Right; No date: ovarian cyst removed 11/14/2015: SALPINGOOPHORECTOMY; Bilateral     Comment:  Procedure: SALPINGO OOPHORECTOMY;  Surgeon: Levi Aland, MD;  Location: [facility];  Service: Gynecology;                Laterality: Bilateral;  BMI    Body Mass Index: 38.35 kg/m      Reproductive/Obstetrics negative OB ROS  Anesthesia Physical Anesthesia Plan  ASA: 2  Anesthesia Plan: General   Post-op Pain Management: Gabapentin PO (pre-op)*, Tylenol PO (pre-op)* and Toradol IV (intra-op)*   Induction: Intravenous  PONV Risk Score and Plan: Dexamethasone, Ondansetron and Midazolam  Airway Management Planned: Oral ETT  Additional Equipment:   Intra-op Plan:   Post-operative Plan: Extubation in OR  Informed Consent: I have reviewed the patients History and Physical, chart, labs and discussed the procedure including the risks, benefits and alternatives for the proposed anesthesia with the patient or authorized representative who has indicated his/her understanding and acceptance.     Dental Advisory Given  Plan Discussed  with: Anesthesiologist, CRNA and Surgeon  Anesthesia Plan Comments: (Pt is unable to feel toes. Her ankle and foot are in a cast. A nerve block was discussed and can be provided in the pacu as needed for severe pain with the understanding that a nerve block on an injured nerve may further delay healing.)        Anesthesia Quick Evaluation

## 2023-05-14 ENCOUNTER — Encounter: Payer: Self-pay | Admitting: Podiatry

## 2023-05-14 ENCOUNTER — Ambulatory Visit: Payer: BC Managed Care – PPO | Admitting: Physician Assistant

## 2023-05-14 DIAGNOSIS — F319 Bipolar disorder, unspecified: Secondary | ICD-10-CM

## 2023-05-14 DIAGNOSIS — F410 Panic disorder [episodic paroxysmal anxiety] without agoraphobia: Secondary | ICD-10-CM

## 2023-05-14 DIAGNOSIS — S82891S Other fracture of right lower leg, sequela: Secondary | ICD-10-CM | POA: Diagnosis not present

## 2023-05-14 DIAGNOSIS — F250 Schizoaffective disorder, bipolar type: Secondary | ICD-10-CM

## 2023-05-14 DIAGNOSIS — S82851K Displaced trimalleolar fracture of right lower leg, subsequent encounter for closed fracture with nonunion: Secondary | ICD-10-CM

## 2023-05-14 DIAGNOSIS — E1142 Type 2 diabetes mellitus with diabetic polyneuropathy: Secondary | ICD-10-CM | POA: Diagnosis not present

## 2023-05-14 DIAGNOSIS — M21171 Varus deformity, not elsewhere classified, right ankle: Secondary | ICD-10-CM | POA: Diagnosis not present

## 2023-05-14 DIAGNOSIS — I10 Essential (primary) hypertension: Secondary | ICD-10-CM

## 2023-05-14 DIAGNOSIS — E669 Obesity, unspecified: Secondary | ICD-10-CM | POA: Insufficient documentation

## 2023-05-14 DIAGNOSIS — J4520 Mild intermittent asthma, uncomplicated: Secondary | ICD-10-CM

## 2023-05-14 DIAGNOSIS — F411 Generalized anxiety disorder: Secondary | ICD-10-CM

## 2023-05-14 LAB — GLUCOSE, CAPILLARY
Glucose-Capillary: 160 mg/dL — ABNORMAL HIGH (ref 70–99)
Glucose-Capillary: 156 mg/dL — ABNORMAL HIGH (ref 70–99)
Glucose-Capillary: 153 mg/dL — ABNORMAL HIGH (ref 70–99)
Glucose-Capillary: 162 mg/dL — ABNORMAL HIGH (ref 70–99)

## 2023-05-14 LAB — CBC WITH DIFFERENTIAL/PLATELET
Abs Immature Granulocytes: 0.08 10*3/uL — ABNORMAL HIGH (ref 0.00–0.07)
Basophils Absolute: 0 10*3/uL (ref 0.0–0.1)
Basophils Relative: 0 %
Eosinophils Absolute: 0 10*3/uL (ref 0.0–0.5)
Eosinophils Relative: 0 %
HCT: 33.9 % — ABNORMAL LOW (ref 36.0–46.0)
Hemoglobin: 11.1 g/dL — ABNORMAL LOW (ref 12.0–15.0)
Immature Granulocytes: 1 %
Lymphocytes Relative: 13 %
Lymphs Abs: 1.3 10*3/uL (ref 0.7–4.0)
MCH: 30 pg (ref 26.0–34.0)
MCHC: 32.7 g/dL (ref 30.0–36.0)
MCV: 91.6 fL (ref 80.0–100.0)
Monocytes Absolute: 0.6 10*3/uL (ref 0.1–1.0)
Monocytes Relative: 6 %
Neutro Abs: 8.6 10*3/uL — ABNORMAL HIGH (ref 1.7–7.7)
Neutrophils Relative %: 80 %
Platelets: 232 10*3/uL (ref 150–400)
RBC: 3.7 MIL/uL — ABNORMAL LOW (ref 3.87–5.11)
RDW: 13.2 % (ref 11.5–15.5)
WBC: 10.7 10*3/uL — ABNORMAL HIGH (ref 4.0–10.5)
nRBC: 0 % (ref 0.0–0.2)

## 2023-05-14 MED ORDER — DOCUSATE SODIUM 100 MG PO CAPS
100.0000 mg | ORAL_CAPSULE | Freq: Two times a day (BID) | ORAL | Status: DC
  Administered 2023-05-14 – 2023-05-19 (×11): 100 mg via ORAL
  Filled 2023-05-14 (×12): qty 1

## 2023-05-14 MED ORDER — POLYETHYLENE GLYCOL 3350 17 G PO PACK
17.0000 g | PACK | Freq: Every day | ORAL | Status: DC
  Administered 2023-05-16 – 2023-05-17 (×2): 17 g via ORAL
  Filled 2023-05-14 (×5): qty 1

## 2023-05-14 MED ORDER — HYDROMORPHONE HCL 1 MG/ML IJ SOLN
1.0000 mg | INTRAMUSCULAR | Status: DC | PRN
  Administered 2023-05-14 – 2023-05-16 (×6): 1 mg via INTRAVENOUS
  Filled 2023-05-14 (×7): qty 1

## 2023-05-14 MED ORDER — SENNA 8.6 MG PO TABS
2.0000 | ORAL_TABLET | Freq: Every day | ORAL | Status: DC
  Administered 2023-05-14 – 2023-05-19 (×6): 17.2 mg via ORAL
  Filled 2023-05-14 (×6): qty 2

## 2023-05-14 MED ORDER — GALCANEZUMAB-GNLM 120 MG/ML ~~LOC~~ SOAJ: 1.0000 | SUBCUTANEOUS | Status: DC

## 2023-05-14 NOTE — Progress Notes (Signed)
Progress Note   Patient: Andrea [name] MVH:[id number] DOB: April 02, 1981 DOA: 05/12/2023     2 DOS: the patient was seen and examined on 05/14/2023   Brief hospital course: Andrea [name] is a 42 y.o. female with medical history significant for obesity, hypertension, bipolar disorder, schizophrenia, type II DM, recent right ankle fracture, who was referred from the podiatrist office to the emergency department because of nonhealing right ankle fracture.   She had a right ankle fracture 05/03/2023 and underwent reduction of right ankle fracture on 05/03/2023.  Patient is seen by podiatry who took her to the OR yesterday for hardware removal, right ankle tibiotalar calcaneal arthrodesis.   Assessment and plan: Nonhealing right ankle fracture: Status post right ankle tibiotalar calcaneal arthrodesis, hardware removal. Continue pain control with opiates, muscle relaxants. Continue Ancef 2 g IV every 8 hourly. Podiatry follow-up appreciated, she will need skilled nursing facility as she is nonweightbearing right lower extremity.  Bipolar disorder, schizophrenia Patient will be continued on her home medications including Cymbalta, Seroquel, Zonegran.  Hypertension: Blood pressure stable.  Continue prazosin at night.  Mild intermittent asthma: No exacerbation noted.  Continue home inhalers, bronchodilator therapy.  Type 2 diabetes mellitus: Recent A1c 6.4 Continue Accu-Cheks, sliding scale insulin. Cardiac, carb consistent diet.  Obesity with BMI 38.35 Contributing to her current condition Diet, exercise and weight reduction advised.  Out of bed to chair. Incentive spirometry. Nursing supportive care. Fall, aspiration precautions. DVT prophylaxis   Code Status: Full Code  Subjective: Patient is seen and examined today morning.  Patient has severe right ankle pain, feels anxious.  Asks for pain medications.  Eating fair, has constipation.  Physical Exam: Vitals:    05/13/23 1900 05/13/23 2328 05/14/23 0441 05/14/23 0733  BP: (!) 144/93 128/76 (!) 138/91 (!) 141/85  Pulse: 81  93 87  Resp: 16 20 20 17   Temp: 98.1 F (36.7 C) 98 F (36.7 C) 98.6 F (37 C) 98.5 F (36.9 C)  TempSrc: Oral Oral    SpO2: 96% 97% 92% 92%  Weight:      Height:        General - Young  obese Caucasian female, in distress due to anxiety, pain HEENT - PERRLA, EOMI, atraumatic head, non tender sinuses. Lung - Clear, bibasal rales, no rhonchi, wheezes. Heart - S1, S2 heard, no murmurs, rubs, trace pedal edema. Abdomen - Soft, non tender, obese, bowel sounds good Neuro - Alert, awake and oriented x 3, non focal exam. Skin - Warm and dry.  Right leg/ankle dressing noted  Data Reviewed:      Latest Ref Rng & Units 05/14/2023    5:18 AM 05/12/2023    1:29 PM 05/04/2023    5:51 AM  CBC  WBC 4.0 - 10.5 K/uL 10.7  9.7  13.1   Hemoglobin 12.0 - 15.0 g/dL 84.1  32.4  40.1   Hematocrit 36.0 - 46.0 % 33.9  38.5  37.9   Platelets 150 - 400 K/uL 232  251  261       Latest Ref Rng & Units 05/12/2023    1:29 PM 05/04/2023    5:51 AM 05/03/2023    4:20 AM  BMP  Glucose 70 - 99 mg/dL 027  253  664   BUN 6 - 20 mg/dL 27  18  21    Creatinine 0.44 - 1.00 mg/dL 4.03  4.74  2.59   Sodium 135 - 145 mmol/L 136  139  139   Potassium 3.5 - 5.1  mmol/L 3.6  3.8  4.0   Chloride 98 - 111 mmol/L 102  103  104   CO2 22 - 32 mmol/L 23  24  26    Calcium 8.9 - 10.3 mg/dL 9.3  9.0  9.1    DG Tibia/Fibula Right  Result Date: 05/13/2023 CLINICAL DATA:  Postop. EXAM: RIGHT TIBIA AND FIBULA - 2 VIEW; RIGHT ANKLE - COMPLETE 3+ VIEW; RIGHT OS CALCIS - 2+ VIEW COMPARISON:  Preoperative imaging. FINDINGS: Interval distal fibular resection. Intramedullary nail with proximal and distal locking screw traversing the tibia, talus, and calcaneus. Interval medial tibial resection. Posterior splint material in place. Multifocal skin staples. IMPRESSION: Interval distal fibular resection and medial tibial  resection. Ankle arthrodesis with intramedullary nail with traversing the tibia, talus, and calcaneus. Electronically Signed   By: Narda Rutherford M.D.   On: 05/13/2023 17:41   DG Ankle Complete Right  Result Date: 05/13/2023 CLINICAL DATA:  Postop. EXAM: RIGHT TIBIA AND FIBULA - 2 VIEW; RIGHT ANKLE - COMPLETE 3+ VIEW; RIGHT OS CALCIS - 2+ VIEW COMPARISON:  Preoperative imaging. FINDINGS: Interval distal fibular resection. Intramedullary nail with proximal and distal locking screw traversing the tibia, talus, and calcaneus. Interval medial tibial resection. Posterior splint material in place. Multifocal skin staples. IMPRESSION: Interval distal fibular resection and medial tibial resection. Ankle arthrodesis with intramedullary nail with traversing the tibia, talus, and calcaneus. Electronically Signed   By: Narda Rutherford M.D.   On: 05/13/2023 17:41   DG Os Calcis Right  Result Date: 05/13/2023 CLINICAL DATA:  Postop. EXAM: RIGHT TIBIA AND FIBULA - 2 VIEW; RIGHT ANKLE - COMPLETE 3+ VIEW; RIGHT OS CALCIS - 2+ VIEW COMPARISON:  Preoperative imaging. FINDINGS: Interval distal fibular resection. Intramedullary nail with proximal and distal locking screw traversing the tibia, talus, and calcaneus. Interval medial tibial resection. Posterior splint material in place. Multifocal skin staples. IMPRESSION: Interval distal fibular resection and medial tibial resection. Ankle arthrodesis with intramedullary nail with traversing the tibia, talus, and calcaneus. Electronically Signed   By: Narda Rutherford M.D.   On: 05/13/2023 17:41   DG Ankle 2 Views Right  Result Date: 05/13/2023 CLINICAL DATA:  Ankle arthrodesis, elective surgery. EXAM: RIGHT ANKLE - 2 VIEW COMPARISON:  Preoperative imaging. FINDINGS: Six fluoroscopic spot views of the ankle obtained in the operating room. Intramedullary nail traverses the calcaneus, talus, and distal tibia with 2 calcaneal screws. Fluoroscopy time 4 minutes 35 seconds.  Dose 8.89 mGy. IMPRESSION: Intraoperative fluoroscopy during ankle surgery. Electronically Signed   By: Narda Rutherford M.D.   On: 05/13/2023 17:40   DG C-Arm 1-60 Min-No Report  Result Date: 05/13/2023 Fluoroscopy was utilized by the requesting physician.  No radiographic interpretation.   DG C-Arm 1-60 Min-No Report  Result Date: 05/13/2023 Fluoroscopy was utilized by the requesting physician.  No radiographic interpretation.   DG C-Arm 1-60 Min-No Report  Result Date: 05/13/2023 Fluoroscopy was utilized by the requesting physician.  No radiographic interpretation.     Family Communication: Discussed with patient, she understand and agree. All questions answereed.  Disposition: Status is: Inpatient Remains inpatient appropriate because: s/p tibiot talo calcaneal fusion with IM nail right ankle, need pain control, SNF placement.  Planned Discharge Destination: Skilled nursing facility     Time spent: 40 minutes  Author: Marcelino Duster, MD 05/14/2023 3:32 PM Secure chat 7am to 7pm For on call review [email or web address].

## 2023-05-14 NOTE — Progress Notes (Signed)
  Subjective:  Patient ID: Andrea [name], female    DOB: 1981/06/13,  MRN: [id number]  Patient seen at bedside POD #1 TTC fusion with IM nail right ankle  She is having some pain said last night was worse than when she broke her ankle  Negative for chest pain and shortness of breath Fever: no Night sweats: no Constitutional signs: no Review of all other systems is negative Objective:   Vitals:   05/14/23 0441 05/14/23 0733  BP: (!) 138/91 (!) 141/85  Pulse: 93 87  Resp: 20 17  Temp: 98.6 F (37 C) 98.5 F (36.9 C)  SpO2: 92% 92%   General AA&O x3. Normal mood and affect.  Vascular Dorsalis pedis and posterior tibial pulses 2/4 bilat. Brisk capillary refill to all digits. Pedal hair present.  Neurologic Epicritic sensation grossly intact.  Dermatologic Dressing clean dry and intact and free of weightbearing  Orthopedic: Lower extremity motor function intact    Assessment & Plan:  Patient was evaluated and treated and all questions answered.  POD #1 TTC fusion right ankle -Pain control today will defer to hospitalist on regimen -Can restart Eliquis for DVT prophylaxis -Ice and elevate right lower extremity place ice behind knee -She will need SNF or rehab placement.  This is medically necessary for her to have a safe outcome and she should not go home directly from the hospital until she is able to safely maintain nonweightbearing effectively -Splint will be changed Friday or Saturday if she is still present -Follow-up with Dr. Annamary Rummage in [place] next Thursday will be scheduled  Edwin Cap, DPM  Accessible via secure chat for questions or concerns.

## 2023-05-14 NOTE — Plan of Care (Signed)
  Problem: Education: Goal: Ability to describe self-care measures that may prevent or decrease complications (Diabetes Survival Skills Education) will improve Outcome: Progressing   Problem: Coping: Goal: Ability to adjust to condition or change in health will improve Outcome: Progressing   Problem: Fluid Volume: Goal: Ability to maintain a balanced intake and output will improve Outcome: Progressing   Problem: Metabolic: Goal: Ability to maintain appropriate glucose levels will improve Outcome: Progressing   Problem: Nutritional: Goal: Maintenance of adequate nutrition will improve Outcome: Progressing   Problem: Tissue Perfusion: Goal: Adequacy of tissue perfusion will improve Outcome: Progressing

## 2023-05-15 DIAGNOSIS — E1142 Type 2 diabetes mellitus with diabetic polyneuropathy: Secondary | ICD-10-CM | POA: Diagnosis not present

## 2023-05-15 DIAGNOSIS — F319 Bipolar disorder, unspecified: Secondary | ICD-10-CM | POA: Diagnosis not present

## 2023-05-15 DIAGNOSIS — M21171 Varus deformity, not elsewhere classified, right ankle: Secondary | ICD-10-CM | POA: Diagnosis not present

## 2023-05-15 DIAGNOSIS — S82891S Other fracture of right lower leg, sequela: Secondary | ICD-10-CM | POA: Diagnosis not present

## 2023-05-15 LAB — GLUCOSE, CAPILLARY
Glucose-Capillary: 135 mg/dL — ABNORMAL HIGH (ref 70–99)
Glucose-Capillary: 174 mg/dL — ABNORMAL HIGH (ref 70–99)
Glucose-Capillary: 139 mg/dL — ABNORMAL HIGH (ref 70–99)

## 2023-05-15 MED ORDER — OXYCODONE HCL 5 MG PO TABS
10.0000 mg | ORAL_TABLET | Freq: Four times a day (QID) | ORAL | Status: DC | PRN
  Administered 2023-05-15 – 2023-05-16 (×4): 10 mg via ORAL
  Filled 2023-05-15 (×4): qty 2

## 2023-05-15 NOTE — Progress Notes (Signed)
Progress Note   Patient: Andrea [name] BJY:[id number] DOB: 10/28/1980 DOA: 05/12/2023     3 DOS: the patient was seen and examined on 05/15/2023   Brief hospital course: BEIJA BASICH is a 42 y.o. female with medical history significant for obesity, hypertension, bipolar disorder, schizophrenia, type II DM, recent right ankle fracture, who was referred from the podiatrist office to the emergency department because of nonhealing right ankle fracture. She had a right ankle fracture 05/03/2023 and underwent reduction of right ankle fracture on 05/03/2023.   Patient is seen by podiatry who took her to the OR 05/13/23 for hardware removal, right ankle tibiotalar calcaneal arthrodesis.   Assessment and plan: Nonhealing right ankle fracture: Status post right ankle tibiotalar calcaneal arthrodesis, hardware removal. Increased dilaudid and oxycodone doses. Continue muscle relaxants. Continue Ancef 2 g IV every 8 hourly. Podiatry follow-up, she will need skilled nursing facility as she is nonweightbearing right lower extremity.  Bipolar disorder, schizophrenia Patient will be continued on her home medications including Cymbalta, Seroquel, Zonegran.  Hypertension: Blood pressure stable.  Continue prazosin at night.  Mild intermittent asthma: No exacerbation noted.  Continue home inhalers, bronchodilator therapy.  Type 2 diabetes mellitus: Recent A1c 6.4 Continue Accu-Cheks, sliding scale insulin. Cardiac, carb consistent diet.  Obesity with BMI 38.35 Contributing to her current condition Diet, exercise and weight reduction advised.  Out of bed to chair. Incentive spirometry. Nursing supportive care. Fall, aspiration precautions. DVT prophylaxis   Code Status: Full Code  Subjective: Patient is seen and examined today morning.  Patient complains of severe stabbing right ankle pain, feels shaking.  Asks for pain medications.  Physical Exam: Vitals:   05/14/23 1500 05/14/23  2331 05/15/23 0810 05/15/23 0817  BP: (!) 140/93 117/74 133/82 (!) 141/82  Pulse: 77 89 83 67  Resp: 15 20 18 18   Temp: 98.3 F (36.8 C) 97.9 F (36.6 C) 98.2 F (36.8 C) (!) 97.5 F (36.4 C)  TempSrc:  Oral Oral Oral  SpO2: 95% 93% 94% 98%  Weight:      Height:        General - Young  obese Caucasian female, in distress due to pain HEENT - PERRLA, EOMI, atraumatic head, non tender sinuses. Lung - Clear, bibasal rales, no rhonchi, wheezes. Heart - S1, S2 heard, no murmurs, rubs, trace pedal edema. Abdomen - Soft, non tender, obese, bowel sounds good Neuro - Alert, awake and oriented x 3, non focal exam. Skin - Warm and dry.  Right leg/ankle dressing noted  Data Reviewed:      Latest Ref Rng & Units 05/14/2023    5:18 AM 05/12/2023    1:29 PM 05/04/2023    5:51 AM  CBC  WBC 4.0 - 10.5 K/uL 10.7  9.7  13.1   Hemoglobin 12.0 - 15.0 g/dL 08.6  57.8  46.9   Hematocrit 36.0 - 46.0 % 33.9  38.5  37.9   Platelets 150 - 400 K/uL 232  251  261       Latest Ref Rng & Units 05/12/2023    1:29 PM 05/04/2023    5:51 AM 05/03/2023    4:20 AM  BMP  Glucose 70 - 99 mg/dL 629  528  413   BUN 6 - 20 mg/dL 27  18  21    Creatinine 0.44 - 1.00 mg/dL 2.44  0.10  2.72   Sodium 135 - 145 mmol/L 136  139  139   Potassium 3.5 - 5.1 mmol/L 3.6  3.8  4.0   Chloride 98 - 111 mmol/L 102  103  104   CO2 22 - 32 mmol/L 23  24  26    Calcium 8.9 - 10.3 mg/dL 9.3  9.0  9.1    DG Tibia/Fibula Right  Result Date: 05/13/2023 CLINICAL DATA:  Postop. EXAM: RIGHT TIBIA AND FIBULA - 2 VIEW; RIGHT ANKLE - COMPLETE 3+ VIEW; RIGHT OS CALCIS - 2+ VIEW COMPARISON:  Preoperative imaging. FINDINGS: Interval distal fibular resection. Intramedullary nail with proximal and distal locking screw traversing the tibia, talus, and calcaneus. Interval medial tibial resection. Posterior splint material in place. Multifocal skin staples. IMPRESSION: Interval distal fibular resection and medial tibial resection. Ankle  arthrodesis with intramedullary nail with traversing the tibia, talus, and calcaneus. Electronically Signed   By: Narda Rutherford M.D.   On: 05/13/2023 17:41   DG Ankle Complete Right  Result Date: 05/13/2023 CLINICAL DATA:  Postop. EXAM: RIGHT TIBIA AND FIBULA - 2 VIEW; RIGHT ANKLE - COMPLETE 3+ VIEW; RIGHT OS CALCIS - 2+ VIEW COMPARISON:  Preoperative imaging. FINDINGS: Interval distal fibular resection. Intramedullary nail with proximal and distal locking screw traversing the tibia, talus, and calcaneus. Interval medial tibial resection. Posterior splint material in place. Multifocal skin staples. IMPRESSION: Interval distal fibular resection and medial tibial resection. Ankle arthrodesis with intramedullary nail with traversing the tibia, talus, and calcaneus. Electronically Signed   By: Narda Rutherford M.D.   On: 05/13/2023 17:41   DG Os Calcis Right  Result Date: 05/13/2023 CLINICAL DATA:  Postop. EXAM: RIGHT TIBIA AND FIBULA - 2 VIEW; RIGHT ANKLE - COMPLETE 3+ VIEW; RIGHT OS CALCIS - 2+ VIEW COMPARISON:  Preoperative imaging. FINDINGS: Interval distal fibular resection. Intramedullary nail with proximal and distal locking screw traversing the tibia, talus, and calcaneus. Interval medial tibial resection. Posterior splint material in place. Multifocal skin staples. IMPRESSION: Interval distal fibular resection and medial tibial resection. Ankle arthrodesis with intramedullary nail with traversing the tibia, talus, and calcaneus. Electronically Signed   By: Narda Rutherford M.D.   On: 05/13/2023 17:41   DG Ankle 2 Views Right  Result Date: 05/13/2023 CLINICAL DATA:  Ankle arthrodesis, elective surgery. EXAM: RIGHT ANKLE - 2 VIEW COMPARISON:  Preoperative imaging. FINDINGS: Six fluoroscopic spot views of the ankle obtained in the operating room. Intramedullary nail traverses the calcaneus, talus, and distal tibia with 2 calcaneal screws. Fluoroscopy time 4 minutes 35 seconds. Dose 8.89 mGy.  IMPRESSION: Intraoperative fluoroscopy during ankle surgery. Electronically Signed   By: Narda Rutherford M.D.   On: 05/13/2023 17:40   DG C-Arm 1-60 Min-No Report  Result Date: 05/13/2023 Fluoroscopy was utilized by the requesting physician.  No radiographic interpretation.   DG C-Arm 1-60 Min-No Report  Result Date: 05/13/2023 Fluoroscopy was utilized by the requesting physician.  No radiographic interpretation.   DG C-Arm 1-60 Min-No Report  Result Date: 05/13/2023 Fluoroscopy was utilized by the requesting physician.  No radiographic interpretation.     Family Communication: Discussed with patient, she understand and agree. All questions answereed.  Disposition: Status is: Inpatient Remains inpatient appropriate because: s/p tibiot talo calcaneal fusion with IM nail right ankle, need pain control, SNF placement.  Planned Discharge Destination: Skilled nursing facility     Time spent: 38 minutes  Author: Marcelino Duster, MD 05/15/2023 2:25 PM Secure chat 7am to 7pm For on call review [email or web address].

## 2023-05-15 NOTE — Plan of Care (Signed)
  Problem: Education: Goal: Ability to describe self-care measures that may prevent or decrease complications (Diabetes Survival Skills Education) will improve Outcome: Progressing   Problem: Coping: Goal: Ability to adjust to condition or change in health will improve Outcome: Progressing   Problem: Fluid Volume: Goal: Ability to maintain a balanced intake and output will improve Outcome: Progressing   Problem: Nutritional: Goal: Maintenance of adequate nutrition will improve Outcome: Progressing Goal: Progress toward achieving an optimal weight will improve Outcome: Progressing   Problem: Tissue Perfusion: Goal: Adequacy of tissue perfusion will improve Outcome: Progressing

## 2023-05-15 NOTE — Evaluation (Addendum)
Physical Therapy Evaluation Patient Details Name: NISHELLE NEUKAM MRN: [id number] DOB: Apr 02, 1981 Today's Date: 05/15/2023  History of Present Illness  Patient is 42 y/o admitted 05/12/23 for closed right ankle fracture. Pt was last here 11/16 found to have right ankle fracture s/p ORIF. Procedure for most recent admission dated 11/26 for (1)removal of deep hardware in right ankle (2)Tibiotalarcalcalneal arthrodesis of right ankle. PmHx includes: Diabetes, HTN, bipolar, and schizophrenia.   Clinical Impression  Pt received in bed and agreed to PT session. Pt reported pain to be on a scale of 8.5/10. Pt performed bed mobility SUP, STS with the use of RW (2wheels) MinA from an elevated surface, and amb ~18ft with RW before pt's left knee buckled which required a necessary seated rest break. Pt able to recover with minA from author and use of BUE's on RW to return to standing. RN notified. While seated EOB, pt performed exercises x10 bilat including: knee extension, hip flexion, and hip abduction. Following the completion of exercises, pt performed step pivot transfer to recliner CGA. VC necessary throughout session for RW management. Pt tolerated Tx fair and will continue to benefit from skilled PT sessions to improve strength, functional mobility, and activity tolerance to maximize safety/return to PLOF following D/C. Pt reported that they would go up the stairs via scooting with their bottom which was not feasible with 3 flights of stairs upon last admission. In future sessions, stair negotiation would be an appropriate goal to achieve as pt was unable to perform stairs prior to admission as a result of surgery on 11/16.          If plan is discharge home, recommend the following: A little help with bathing/dressing/bathroom;Assist for transportation;Help with stairs or ramp for entrance;Assistance with cooking/housework   Can travel by private vehicle   No    Equipment Recommendations Other  (comment) (TBD at next facility)  Recommendations for Other Services       Functional Status Assessment Patient has had a recent decline in their functional status and demonstrates the ability to make significant improvements in function in a reasonable and predictable amount of time.     Precautions / Restrictions Precautions Precautions: Fall;Other (comment) (Foot/Ankle) Restrictions Weight Bearing Restrictions: Yes RLE Weight Bearing: Non weight bearing      Mobility  Bed Mobility                 Patient Response: Cooperative  Transfers                        Ambulation/Gait               General Gait Details: Pt amb with the use of RW (2wheels) ~10' before left knee buckled. Rest break necessary following knee buckling where pt performed seated exercises at EOB.  Stairs            Wheelchair Mobility     Tilt Bed Tilt Bed Patient Response: Cooperative  Modified Rankin (Stroke Patients Only)       Balance                                             Pertinent Vitals/Pain      Home Living Family/patient expects to be discharged to:: Private residence Living Arrangements: Spouse/significant other Available Help at Discharge: Family;Available PRN/intermittently (Significant other works at  night) Type of Home: Apartment Home Access: Stairs to enter Entrance Stairs-Rails: Right;Left;Can reach both Entrance Stairs-Number of Steps: 3 flights   Home Layout: One level Home Equipment: Agricultural consultant (2 wheels)      Prior Function Prior Level of Function : Independent/Modified Independent;Working/employed             Mobility Comments: patient works 2 jobs. she is typically independent with mobility ADLs Comments: Pt reports IND prior to admission.     Extremity/Trunk Assessment   Upper Extremity Assessment Upper Extremity Assessment: Overall WFL for tasks assessed    Lower Extremity Assessment Lower  Extremity Assessment: RLE deficits/detail RLE Deficits / Details: S/P right removal of hardware and tibiotalarcalcaneal arthrodesis       Communication   Communication Communication: No apparent difficulties Cueing Techniques: Verbal cues  Cognition                                                General Comments      Exercises     Assessment/Plan    PT Assessment Patient needs continued PT services  PT Problem List Decreased range of motion;Decreased activity tolerance;Decreased mobility;Decreased balance;Decreased knowledge of precautions;Decreased safety awareness;Pain       PT Treatment Interventions DME instruction;Gait training;Stair training;Functional mobility training;Therapeutic activities;Therapeutic exercise;Cognitive remediation;Patient/family education    PT Goals (Current goals can be found in the Care Plan section)  Acute Rehab PT Goals Patient Stated Goal: Go to SNF PT Goal Formulation: With patient Time For Goal Achievement: 05/29/23 Potential to Achieve Goals: Fair    Frequency Min 1X/week     Co-evaluation               AM-PAC PT "6 Clicks" Mobility  Outcome Measure Help needed turning from your back to your side while in a flat bed without using bedrails?: None Help needed moving from lying on your back to sitting on the side of a flat bed without using bedrails?: A Little Help needed moving to and from a bed to a chair (including a wheelchair)?: A Little Help needed standing up from a chair using your arms (e.g., wheelchair or bedside chair)?: A Little Help needed to walk in hospital room?: A Lot Help needed climbing 3-5 steps with a railing? : Total 6 Click Score: 16    End of Session Equipment Utilized During Treatment: Gait belt Activity Tolerance: Patient limited by pain Patient left: in chair;with call bell/phone within reach;with chair alarm set Nurse Communication: Mobility status PT Visit Diagnosis:  Difficulty in walking, not elsewhere classified (R26.2);Other abnormalities of gait and mobility (R26.89)    Time: 1610-9604 PT Time Calculation (min) (ACUTE ONLY): 20 min   Charges:   PT Evaluation $PT Eval Low Complexity: 1 Low   PT General Charges $$ ACUTE PT VISIT: 1 Visit         Nova Evett [employer] SPT, LAT, ATC  [place]. [place] IV, PT, DPT Physical Therapist- [place]  [facility]  05/15/2023, 10:18 AM

## 2023-05-16 ENCOUNTER — Inpatient Hospital Stay: Payer: BC Managed Care – PPO

## 2023-05-16 DIAGNOSIS — E1142 Type 2 diabetes mellitus with diabetic polyneuropathy: Secondary | ICD-10-CM | POA: Diagnosis not present

## 2023-05-16 DIAGNOSIS — F319 Bipolar disorder, unspecified: Secondary | ICD-10-CM | POA: Diagnosis not present

## 2023-05-16 DIAGNOSIS — M21171 Varus deformity, not elsewhere classified, right ankle: Secondary | ICD-10-CM | POA: Diagnosis not present

## 2023-05-16 DIAGNOSIS — S82891S Other fracture of right lower leg, sequela: Secondary | ICD-10-CM | POA: Diagnosis not present

## 2023-05-16 LAB — GLUCOSE, CAPILLARY
Glucose-Capillary: 137 mg/dL — ABNORMAL HIGH (ref 70–99)
Glucose-Capillary: 168 mg/dL — ABNORMAL HIGH (ref 70–99)
Glucose-Capillary: 183 mg/dL — ABNORMAL HIGH (ref 70–99)
Glucose-Capillary: 146 mg/dL — ABNORMAL HIGH (ref 70–99)

## 2023-05-16 MED ORDER — APIXABAN 2.5 MG PO TABS
2.5000 mg | ORAL_TABLET | Freq: Two times a day (BID) | ORAL | Status: DC
  Administered 2023-05-16 – 2023-05-20 (×8): 2.5 mg via ORAL
  Filled 2023-05-16 (×8): qty 1

## 2023-05-16 MED ORDER — OXYCODONE HCL 5 MG PO TABS
5.0000 mg | ORAL_TABLET | Freq: Four times a day (QID) | ORAL | Status: DC | PRN
  Administered 2023-05-16 – 2023-05-20 (×5): 5 mg via ORAL
  Filled 2023-05-16 (×7): qty 1

## 2023-05-16 MED ORDER — HYDROMORPHONE HCL 1 MG/ML IJ SOLN
0.5000 mg | INTRAMUSCULAR | Status: DC | PRN
  Administered 2023-05-16 – 2023-05-19 (×6): 0.5 mg via INTRAVENOUS
  Filled 2023-05-16 (×6): qty 0.5

## 2023-05-16 NOTE — Progress Notes (Signed)
  Subjective:  Patient ID: Andrea [name], female    DOB: [birth year],  MRN: [id number]  Patient seen at bedside POD #3 TTC fusion with IM nail right ankle 05/13/23  Patient seen resting comfortably bedside.  States her pain is well-controlled.  Splint to right lower extremity is clean dry and intact.  Negative for chest pain and shortness of breath Fever: no Night sweats: no Constitutional signs: no Review of all other systems is negative Objective:   Vitals:   05/16/23 0822 05/16/23 0859  BP: 119/73 103/66  Pulse: 89 (!) 103  Resp: 16 16  Temp: 98.3 F (36.8 C) (!) 97.4 F (36.3 C)  SpO2: 94% 94%   General AA&O x3. Normal mood and affect.  Vascular Dorsalis pedis and posterior tibial pulses 2/4 bilat. Brisk capillary refill to all digits. Pedal hair present. Edema present within postoperative limits. Tenderness with right calf squeeze noted.  Neurologic Epicritic sensation grossly intact.  Dermatologic Surgical incision sites are noted with skin staples intact.  Skin edges well-approximated, well coapted.  No evidence of dehiscence or gapping appreciated.  Scant sanguinous drainage noted. Ecchymosis is appreciated to anterior pretibial region, posterior medial calf, posterior heel.  Orthopedic: Lower extremity motor function intact    Anterior medial ankle and leg  Lateral ankle  Plantar heel  Posterior heel  Assessment & Plan:  Patient was evaluated and treated and all questions answered.  POD #3 TTC fusion right ankle -Pain control today will defer to hospitalist on regimen -Continue restart Eliquis for DVT prophylaxis - Complete course 10 day course of antibiotics from 06/12/23. Ok to transition to PO -Ice and elevate right lower extremity place ice behind knee - Ordering Venous US RLE to to rule out DVT due to posterior calf tenderness, patient otherwise denying fever, shortness of breath. -She will need SNF or rehab placement.  This is medically necessary for  her to have a safe outcome and she should not go home directly from the hospital until she is able to safely maintain nonweightbearing effectively -Xeroform applied to right lower extremity incision lines, dressed with 4 x 4 gauze, ABD, Kerlix.  Well-padded posterior splint applied -Care was taken to pad the posterior heel and leg today with ABDs, will check skin again over the weekend due to edema and ecchymosis present however this is within expected limits postoperatively -Follow-up with Dr. Annamary Rummage in [place] next Thursday will be scheduled  Barbaraann Share, DPM  Accessible via secure chat for questions or concerns.

## 2023-05-16 NOTE — [place] FL2 (Signed)
[place] MEDICAID FL2 LEVEL OF CARE FORM     IDENTIFICATION  Patient Name: Andrea [name] Birthdate: April 02, 1981 Sex: female Admission Date (Current Location): 05/12/2023  [facility] and [place] Number:  Chiropodist and Address:  [facility], [address] [place], [place] [address]      Provider Number: [id number]  Attending Physician Name and Address:  Marcelino Duster, MD  Relative Name and Phone Number:  Barbara Cower [phone]    Current Level of Care: Hospital Recommended Level of Care: Skilled Nursing Facility Prior Approval Number:    Date Approved/Denied:   PASRR Number: [id number]  Discharge Plan: SNF    Current Diagnoses: Patient Active Problem List   Diagnosis Date Noted   Obesity (BMI 30-39.9) 05/14/2023   Acquired varus deformity of right ankle 05/13/2023   Postoperative surgical complication involving musculoskeletal system associated with musculoskeletal procedure 05/12/2023   Closed right ankle fracture 05/03/2023   Bipolar 1 disorder (HCC) 05/03/2023   Type 2 diabetes mellitus with peripheral neuropathy (HCC) 05/03/2023   Essential hypertension 05/03/2023   Mild persistent asthma 05/03/2023   Closed trimalleolar fracture of right ankle 05/03/2023   Pre-syncope 05/03/2023   Chronic pain 07/16/2022   PTSD (post-traumatic stress disorder) 03/13/2022   Generalized anxiety disorder with panic attacks 03/13/2022   Moderate episode of recurrent major depressive disorder (HCC) 03/13/2022   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 03/13/2022   Other insomnia 03/13/2022   Long term current use of antipsychotic medication 03/13/2022   Polypharmacy 03/13/2022   Convulsion (HCC) 11/26/2021   Irregular periods 09/26/2020   Mild persistent asthma with acute exacerbation 06/18/2017   Uncontrolled type 2 diabetes mellitus with hyperglycemia, with long-term current use of insulin (HCC) 09/18/2016    HTN (hypertension) 09/18/2016   Pelvic pain in female 11/14/2015   Acute pyelonephritis 01/16/2013   Migraines 10/30/2012   Obesity, Class III, BMI 40-49.9 (morbid obesity) (HCC) 10/30/2012   Kidney stones     Orientation RESPIRATION BLADDER Height & Weight     Time, Self, Situation  Normal Continent Weight: 124.7 kg Height:  5\' 11"  (180.3 cm)  BEHAVIORAL SYMPTOMS/MOOD NEUROLOGICAL BOWEL NUTRITION STATUS     (None) Continent Diet  AMBULATORY STATUS COMMUNICATION OF NEEDS Skin   Limited Assist Verbally Normal, Surgical wounds                       Personal Care Assistance Level of Assistance  Bathing, Feeding, Dressing, Total care Bathing Assistance: Limited assistance Feeding assistance: Independent Dressing Assistance: Limited assistance Total Care Assistance: Limited assistance   Functional Limitations Info  Sight, Hearing, Speech Sight Info: Adequate Hearing Info: Adequate Speech Info: Adequate    SPECIAL CARE FACTORS FREQUENCY  PT (By licensed PT), OT (By licensed OT)     PT Frequency: 5 times per week OT Frequency: 5 times per week            Contractures Contractures Info: Not present    Additional Factors Info  Code Status, Allergies Code Status Info: full code Allergies Info: bactrim, codeine           Current Medications (05/16/2023):  This is the current hospital active medication list Current Facility-Administered Medications  Medication Dose Route Frequency Provider Last Rate Last Admin   albuterol (PROVENTIL) (2.5 MG/3ML) 0.083% nebulizer solution 2.5 mg  2.5 mg Nebulization Q4H PRN Standiford, Jenelle Mages, DPM       ceFAZolin (ANCEF) IVPB 2g/100 mL premix  2  g Intravenous Q8H StandifordJenelle Mages, DPM 200 mL/hr at 05/16/23 1332 2 g at 05/16/23 1332   docusate sodium (COLACE) capsule 100 mg  100 mg Oral BID Marcelino Duster, MD   100 mg at 05/16/23 0816   DULoxetine (CYMBALTA) DR capsule 120 mg  120 mg Oral Daily Pilar Plate, DPM   120 mg at 05/16/23 0817   famotidine (PEPCID) tablet 40 mg  40 mg Oral BID Pilar Plate, DPM   40 mg at 05/16/23 0816   gabapentin (NEURONTIN) capsule 300 mg  300 mg Oral BID WC Standiford, Jenelle Mages, DPM   300 mg at 05/16/23 1153   gabapentin (NEURONTIN) capsule 900 mg  900 mg Oral QHS Pilar Plate, DPM   900 mg at 05/15/23 2348   HYDROmorphone (DILAUDID) injection 0.5 mg  0.5 mg Intravenous Q4H PRN Marcelino Duster, MD       insulin aspart (novoLOG) injection 0-15 Units  0-15 Units Subcutaneous TID WC Pilar Plate, DPM   3 Units at 05/16/23 1206   insulin aspart (novoLOG) injection 0-5 Units  0-5 Units Subcutaneous QHS Pilar Plate, DPM   2 Units at 05/13/23 2250   loratadine (CLARITIN) tablet 10 mg  10 mg Oral QHS StandifordJenelle Mages, DPM   10 mg at 05/15/23 2347   meclizine (ANTIVERT) tablet 12.5 mg  12.5 mg Oral TID PRN Pilar Plate, DPM       mirabegron ER (MYRBETRIQ) tablet 25 mg  25 mg Oral Daily Pilar Plate, DPM   25 mg at 05/16/23 0817   mometasone-formoterol (DULERA) 200-5 MCG/ACT inhaler 2 puff  2 puff Inhalation BID Pilar Plate, DPM   2 puff at 05/15/23 2345   montelukast (SINGULAIR) tablet 10 mg  10 mg Oral Daily Standiford, Jenelle Mages, DPM   10 mg at 05/16/23 0816   ondansetron (ZOFRAN) injection 4 mg  4 mg Intravenous Q6H PRN Standiford, Jenelle Mages, DPM       oxyCODONE (Oxy IR/ROXICODONE) immediate release tablet 5 mg  5 mg Oral Q6H PRN Marcelino Duster, MD       pantoprazole (PROTONIX) EC tablet 40 mg  40 mg Oral q morning Standiford, Jenelle Mages, DPM   40 mg at 05/16/23 0816   polyethylene glycol (MIRALAX / GLYCOLAX) packet 17 g  17 g Oral Daily Marcelino Duster, MD   17 g at 05/16/23 0817   prazosin (MINIPRESS) capsule 4 mg  4 mg Oral QHS Standiford, Jenelle Mages, DPM   4 mg at 05/15/23 2346   QUEtiapine (SEROQUEL) tablet 100 mg  100 mg Oral QHS StandifordJenelle Mages, DPM   100 mg at 05/15/23 2347   QUEtiapine (SEROQUEL) tablet 50 mg  50 mg Oral BID Pilar Plate, DPM   50 mg at 05/16/23 0816   senna (SENOKOT) tablet 17.2 mg  2 tablet Oral QHS Marcelino Duster, MD   17.2 mg at 05/15/23 2346   tiZANidine (ZANAFLEX) tablet 4 mg  4 mg Oral Q8H PRN Standiford, Jenelle Mages, DPM       traZODone (DESYREL) tablet 150 mg  150 mg Oral QHS Pilar Plate, DPM   150 mg at 05/15/23 2347   zonisamide (ZONEGRAN) capsule 500 mg  500 mg Oral QHS StandifordJenelle Mages, DPM   500 mg at 05/15/23 2346     Discharge Medications: Please see discharge summary for a list of discharge medications.  Relevant Imaging Results:  Relevant Lab Results:   Additional  Information SS# [id number]  Marlowe Sax, RN

## 2023-05-16 NOTE — Progress Notes (Signed)
Physical Therapy Treatment Patient Details Name: Andrea [name] MRN: [id number] DOB: 06-Apr-1981 Today's Date: 05/16/2023   History of Present Illness Patient is 42 y/o admitted 05/12/23 for closed right ankle fracture. Pt was last here 11/16 found to have right ankle fracture s/p ORIF. Procedure for most recent admission dated 11/26 for (1)removal of deep hardware in right ankle (2)Tibiotalarcalcalneal arthrodesis of right ankle. PmHx includes: Diabetes, HTN, bipolar, and schizophrenia.    PT Comments  Author attempted to treat pt 2 x earlier this date however pt sleeping soundly. She agrees to session at this time and was cooperative throughout. She remains severely limited by her NWB restrictions. Pt was able to exit bed without physical assistance. Stood to RW from elevated bed height, and then "hop" (NWB) to [facility] then to recliner. Pt is limited by fatigue, NWB  and pain. She has 3 flights of stairs to enter her home and was unwilling to attempt this date. Will encourage next session. DC recs remain appropriate due to her NWB restrictions and inability to safely enter/exit home.    If plan is discharge home, recommend the following: A little help with bathing/dressing/bathroom;Assist for transportation;Help with stairs or ramp for entrance;Assistance with cooking/housework     Equipment Recommendations  Other (comment) (Defer to next level of care)       Precautions / Restrictions Precautions Precautions: Fall Required Braces or Orthoses: Splint/Cast Restrictions Weight Bearing Restrictions: Yes RLE Weight Bearing: Non weight bearing     Mobility  Bed Mobility Overal bed mobility: Needs Assistance Bed Mobility: Supine to Sit  Supine to sit: Supervision  General bed mobility comments: no physical assistance to safely exit L side of bed    Transfers Overall transfer level: Needs assistance Equipment used: Rolling walker (2 wheels) Transfers: Sit to/from Stand Sit to Stand:  Contact guard assist, From elevated surface Step pivot transfers: Contact guard assist  General transfer comment: CGA for safety. pt had bed height elevated and used momentum to achieve standing    Ambulation/Gait Ambulation/Gait assistance: Contact guard assist Gait Distance (Feet): 8 Feet Assistive device: Rolling walker (2 wheels) Gait Pattern/deviations: Step-to pattern, Antalgic (" Hop to.") Gait velocity: decreased  General Gait Details: Pt was able to "hop to"gait sequencing form EOB > BSC > recliner   Stairs Stairs:  (pt unwilling to attempt stairs this date.)     Balance Overall balance assessment: Needs assistance Sitting-balance support: Feet supported Sitting balance-Leahy Scale: Good     Standing balance support: Bilateral upper extremity supported, Reliant on assistive device for balance, During functional activity Standing balance-Leahy Scale: Fair Standing balance comment: pt did not have LOB with BUE support. remains high fall risk due to NWB restrictions       Cognition Arousal: Alert Behavior During Therapy: WFL for tasks assessed/performed Overall Cognitive Status: Within Functional Limits for tasks assessed    General Comments: Pt pleasant and willing to participate in PT session.           General Comments General comments (skin integrity, edema, etc.): Author issued supine ther ex to promote increased daily physical activity. Pt states understanding and demonstrates abilities to perform I'ly      Pertinent Vitals/Pain Pain Assessment Pain Assessment: 0-10 Pain Score: 7  Pain Location: R ankle Pain Descriptors / Indicators: Aching, Constant, Discomfort Pain Intervention(s): Limited activity within patient's tolerance, Monitored during session, Premedicated before session, Repositioned     PT Goals (current goals can now be found in the care plan section) Acute Rehab  PT Goals Patient Stated Goal: Go to rehab so I can do stairs to get into my  apartment. Progress towards PT goals: Progressing toward goals    Frequency    Min 1X/week       AM-PAC PT "6 Clicks" Mobility   Outcome Measure  Help needed turning from your back to your side while in a flat bed without using bedrails?: None Help needed moving from lying on your back to sitting on the side of a flat bed without using bedrails?: A Little Help needed moving to and from a bed to a chair (including a wheelchair)?: A Little Help needed standing up from a chair using your arms (e.g., wheelchair or bedside chair)?: A Little Help needed to walk in hospital room?: A Little Help needed climbing 3-5 steps with a railing? : Total 6 Click Score: 17    End of Session   Activity Tolerance: Patient tolerated treatment well Patient left: in chair;with call bell/phone within reach;with chair alarm set Nurse Communication: Mobility status PT Visit Diagnosis: Difficulty in walking, not elsewhere classified (R26.2);Other abnormalities of gait and mobility (R26.89)     Time: 1203-1220 PT Time Calculation (min) (ACUTE ONLY): 17 min  Charges:    $Therapeutic Activity: 8-22 mins PT General Charges $$ ACUTE PT VISIT: 1 Visit                     Jetta Lout PTA 05/16/23, 1:06 PM

## 2023-05-16 NOTE — Plan of Care (Signed)

## 2023-05-16 NOTE — [place] FL2 (Signed)
[place] MEDICAID FL2 LEVEL OF CARE FORM     IDENTIFICATION  Patient Name: Andrea [name] Birthdate: 10-28-1980 Sex: female Admission Date (Current Location): 05/12/2023  [facility] and [place] Number:  Chiropodist and Address:  [facility], [address] [place], [place] [address]      Provider Number: [id number]  Attending Physician Name and Address:  Marcelino Duster, MD  Relative Name and Phone Number:  Barbara Cower [phone]    Current Level of Care: Hospital Recommended Level of Care: Skilled Nursing Facility Prior Approval Number:    Date Approved/Denied:   PASRR Number: [id number]  Discharge Plan: SNF    Current Diagnoses: Patient Active Problem List   Diagnosis Date Noted   Obesity (BMI 30-39.9) 05/14/2023   Acquired varus deformity of right ankle 05/13/2023   Postoperative surgical complication involving musculoskeletal system associated with musculoskeletal procedure 05/12/2023   Closed right ankle fracture 05/03/2023   Bipolar 1 disorder (HCC) 05/03/2023   Type 2 diabetes mellitus with peripheral neuropathy (HCC) 05/03/2023   Essential hypertension 05/03/2023   Mild persistent asthma 05/03/2023   Closed trimalleolar fracture of right ankle 05/03/2023   Pre-syncope 05/03/2023   Chronic pain 07/16/2022   PTSD (post-traumatic stress disorder) 03/13/2022   Generalized anxiety disorder with panic attacks 03/13/2022   Moderate episode of recurrent major depressive disorder (HCC) 03/13/2022   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 03/13/2022   Other insomnia 03/13/2022   Long term current use of antipsychotic medication 03/13/2022   Polypharmacy 03/13/2022   Convulsion (HCC) 11/26/2021   Irregular periods 09/26/2020   Mild persistent asthma with acute exacerbation 06/18/2017   Uncontrolled type 2 diabetes mellitus with hyperglycemia, with long-term current use of insulin (HCC) 09/18/2016    HTN (hypertension) 09/18/2016   Pelvic pain in female 11/14/2015   Acute pyelonephritis 01/16/2013   Migraines 10/30/2012   Obesity, Class III, BMI 40-49.9 (morbid obesity) (HCC) 10/30/2012   Kidney stones     Orientation RESPIRATION BLADDER Height & Weight     Time, Self, Situation  Normal Continent Weight: 124.7 kg Height:  5\' 11"  (180.3 cm)  BEHAVIORAL SYMPTOMS/MOOD NEUROLOGICAL BOWEL NUTRITION STATUS     (None) Continent Diet  AMBULATORY STATUS COMMUNICATION OF NEEDS Skin   Limited Assist Verbally Normal, Surgical wounds                       Personal Care Assistance Level of Assistance  Bathing, Feeding, Dressing, Total care Bathing Assistance: Limited assistance Feeding assistance: Independent Dressing Assistance: Limited assistance Total Care Assistance: Limited assistance   Functional Limitations Info  Sight, Hearing, Speech Sight Info: Adequate Hearing Info: Adequate Speech Info: Adequate    SPECIAL CARE FACTORS FREQUENCY  PT (By licensed PT), OT (By licensed OT)     PT Frequency: 5 times per week OT Frequency: 5 times per week            Contractures Contractures Info: Not present    Additional Factors Info  Psychotropic (seroqueal, Trazadone, Cymbalta) Code Status Info: full code Allergies Info: bactrim, codeine           Current Medications (05/16/2023):  This is the current hospital active medication list Current Facility-Administered Medications  Medication Dose Route Frequency Provider Last Rate Last Admin   albuterol (PROVENTIL) (2.5 MG/3ML) 0.083% nebulizer solution 2.5 mg  2.5 mg Nebulization Q4H PRN Standiford, Jenelle Mages, DPM       ceFAZolin (ANCEF) IVPB 2g/100 mL premix  2 g Intravenous Q8H StandifordJenelle Mages, DPM 200 mL/hr at 05/16/23 1332 2 g at 05/16/23 1332   docusate sodium (COLACE) capsule 100 mg  100 mg Oral BID Marcelino Duster, MD   100 mg at 05/16/23 0816   DULoxetine (CYMBALTA) DR capsule 120 mg  120 mg Oral  Daily Pilar Plate, DPM   120 mg at 05/16/23 0817   famotidine (PEPCID) tablet 40 mg  40 mg Oral BID Pilar Plate, DPM   40 mg at 05/16/23 0816   gabapentin (NEURONTIN) capsule 300 mg  300 mg Oral BID WC Standiford, Jenelle Mages, DPM   300 mg at 05/16/23 1153   gabapentin (NEURONTIN) capsule 900 mg  900 mg Oral QHS Pilar Plate, DPM   900 mg at 05/15/23 2348   HYDROmorphone (DILAUDID) injection 0.5 mg  0.5 mg Intravenous Q4H PRN Marcelino Duster, MD       insulin aspart (novoLOG) injection 0-15 Units  0-15 Units Subcutaneous TID WC Pilar Plate, DPM   3 Units at 05/16/23 1206   insulin aspart (novoLOG) injection 0-5 Units  0-5 Units Subcutaneous QHS Pilar Plate, DPM   2 Units at 05/13/23 2250   loratadine (CLARITIN) tablet 10 mg  10 mg Oral QHS StandifordJenelle Mages, DPM   10 mg at 05/15/23 2347   meclizine (ANTIVERT) tablet 12.5 mg  12.5 mg Oral TID PRN Pilar Plate, DPM       mirabegron ER (MYRBETRIQ) tablet 25 mg  25 mg Oral Daily Pilar Plate, DPM   25 mg at 05/16/23 0817   mometasone-formoterol (DULERA) 200-5 MCG/ACT inhaler 2 puff  2 puff Inhalation BID Pilar Plate, DPM   2 puff at 05/15/23 2345   montelukast (SINGULAIR) tablet 10 mg  10 mg Oral Daily Standiford, Jenelle Mages, DPM   10 mg at 05/16/23 0816   ondansetron (ZOFRAN) injection 4 mg  4 mg Intravenous Q6H PRN Standiford, Jenelle Mages, DPM       oxyCODONE (Oxy IR/ROXICODONE) immediate release tablet 5 mg  5 mg Oral Q6H PRN Marcelino Duster, MD       pantoprazole (PROTONIX) EC tablet 40 mg  40 mg Oral q morning Standiford, Jenelle Mages, DPM   40 mg at 05/16/23 0816   polyethylene glycol (MIRALAX / GLYCOLAX) packet 17 g  17 g Oral Daily Marcelino Duster, MD   17 g at 05/16/23 0817   prazosin (MINIPRESS) capsule 4 mg  4 mg Oral QHS Standiford, Jenelle Mages, DPM   4 mg at 05/15/23 2346   QUEtiapine (SEROQUEL) tablet 100 mg  100 mg Oral QHS  StandifordJenelle Mages, DPM   100 mg at 05/15/23 2347   QUEtiapine (SEROQUEL) tablet 50 mg  50 mg Oral BID Pilar Plate, DPM   50 mg at 05/16/23 0816   senna (SENOKOT) tablet 17.2 mg  2 tablet Oral QHS Marcelino Duster, MD   17.2 mg at 05/15/23 2346   tiZANidine (ZANAFLEX) tablet 4 mg  4 mg Oral Q8H PRN Standiford, Jenelle Mages, DPM       traZODone (DESYREL) tablet 150 mg  150 mg Oral QHS Pilar Plate, DPM   150 mg at 05/15/23 2347   zonisamide (ZONEGRAN) capsule 500 mg  500 mg Oral QHS StandifordJenelle Mages, DPM   500 mg at 05/15/23 2346     Discharge Medications: Please see discharge summary for a list of discharge medications.  Relevant Imaging Results:  Relevant Lab Results:  Additional Information SS# [id number]  Marlowe Sax, RN

## 2023-05-16 NOTE — Anesthesia Postprocedure Evaluation (Signed)
Anesthesia Post Note  Patient: Andrea [name]  Procedure(s) Performed: ARTHRODESIS ANKLE (Right: Ankle)  Patient location during evaluation: PACU Anesthesia Type: General Level of consciousness: awake and alert Pain management: pain level controlled Vital Signs Assessment: post-procedure vital signs reviewed and stable Respiratory status: spontaneous breathing, nonlabored ventilation and respiratory function stable Cardiovascular status: blood pressure returned to baseline and stable Postop Assessment: no apparent nausea or vomiting Anesthetic complications: no   No notable events documented.   Last Vitals:  Vitals:   05/15/23 1621 05/15/23 2228  BP: 127/89 128/78  Pulse: 95 86  Resp: 18 18  Temp: 36.8 C 36.8 C  SpO2: 95% 95%    Last Pain:  Vitals:   05/16/23 0438  TempSrc:   PainSc: 5                  Foye Deer

## 2023-05-16 NOTE — Progress Notes (Signed)
Progress Note   Patient: Andrea [name] IEP:[id number] DOB: 1980/11/08 DOA: 05/12/2023     4 DOS: the patient was seen and examined on 05/16/2023   Brief hospital course: MARIBELL STENERSON is a 42 y.o. female with medical history significant for obesity, hypertension, bipolar disorder, schizophrenia, type II DM, recent right ankle fracture, who was referred from the podiatrist office to the emergency department because of nonhealing right ankle fracture. She had a right ankle fracture 05/03/2023 and underwent reduction of right ankle fracture on 05/03/2023.   Patient is seen by podiatry who took her to the OR 05/13/23 for hardware removal, right ankle tibiotalar calcaneal arthrodesis.   Assessment and plan: Nonhealing right ankle fracture: Status post right ankle tibiotalar calcaneal arthrodesis, hardware removal. Pain better today. Will go down on dilaudid and oxycodone doses. Continue muscle relaxants. Continue Ancef 2 g IV every 8 hourly. Will change to Augmentin from tomorrow to finish 10 days of abx. Podiatry follow-up, removed her dressing, strongly recommended skilled nursing facility as she is nonweightbearing right lower extremity. Uls r/o DVT ordered. Eliquis resumed for DVT prophylaxis.  Bipolar disorder, schizophrenia Continue Cymbalta, Seroquel, Zonegran.  Hypertension: Blood pressure stable.  Continue prazosin at night.  Mild intermittent asthma: No exacerbation noted.  Continue home inhalers, bronchodilator therapy.  Type 2 diabetes mellitus: Recent A1c 6.4 Continue Accu-Cheks, sliding scale insulin. Cardiac, carb consistent diet.  Obesity with BMI 38.35 Contributing to her current condition Diet, exercise and weight reduction advised.  Out of bed to chair. Incentive spirometry. Nursing supportive care. Fall, aspiration precautions. DVT prophylaxis   Code Status: Full Code  Subjective: Patient is seen and examined today morning.  Patient is sleeping  comfortably, states pain better, has constipation.  Eating fair.  Physical Exam: Vitals:   05/15/23 1621 05/15/23 2228 05/16/23 0822 05/16/23 0859  BP: 127/89 128/78 119/73 103/66  Pulse: 95 86 89 (!) 103  Resp: 18 18 16 16   Temp: 98.3 F (36.8 C) 98.2 F (36.8 C) 98.3 F (36.8 C) (!) 97.4 F (36.3 C)  TempSrc: Oral Oral Oral   SpO2: 95% 95% 94% 94%  Weight:      Height:        General - Young  obese Caucasian female, no acute distress HEENT - PERRLA, EOMI, atraumatic head, non tender sinuses. Lung - Clear, bibasal rales, no rhonchi, wheezes. Heart - S1, S2 heard, no murmurs, rubs, trace pedal edema. Abdomen - Soft, non tender, obese, bowel sounds good Neuro - Alert, awake and oriented x 3, non focal exam. Skin - Warm and dry.  Right leg/ankle dressing noted  Data Reviewed:      Latest Ref Rng & Units 05/14/2023    5:18 AM 05/12/2023    1:29 PM 05/04/2023    5:51 AM  CBC  WBC 4.0 - 10.5 K/uL 10.7  9.7  13.1   Hemoglobin 12.0 - 15.0 g/dL 66.0  63.0  16.0   Hematocrit 36.0 - 46.0 % 33.9  38.5  37.9   Platelets 150 - 400 K/uL 232  251  261       Latest Ref Rng & Units 05/12/2023    1:29 PM 05/04/2023    5:51 AM 05/03/2023    4:20 AM  BMP  Glucose 70 - 99 mg/dL 109  323  557   BUN 6 - 20 mg/dL 27  18  21    Creatinine 0.44 - 1.00 mg/dL 3.22  0.25  4.27   Sodium 135 - 145 mmol/L 136  139  139   Potassium 3.5 - 5.1 mmol/L 3.6  3.8  4.0   Chloride 98 - 111 mmol/L 102  103  104   CO2 22 - 32 mmol/L 23  24  26    Calcium 8.9 - 10.3 mg/dL 9.3  9.0  9.1    No results found.   Family Communication: Discussed with patient, she understand and agree. All questions answereed.  Disposition: Status is: Inpatient Remains inpatient appropriate because: s/p tibiot talo calcaneal fusion with IM nail right ankle, need pain control, SNF placement.  Planned Discharge Destination: Skilled nursing facility     Time spent: 36 minutes  Author: Marcelino Duster,  MD 05/16/2023 2:59 PM Secure chat 7am to 7pm For on call review [email or web address].

## 2023-05-17 DIAGNOSIS — F319 Bipolar disorder, unspecified: Secondary | ICD-10-CM | POA: Diagnosis not present

## 2023-05-17 DIAGNOSIS — M21171 Varus deformity, not elsewhere classified, right ankle: Secondary | ICD-10-CM | POA: Diagnosis not present

## 2023-05-17 DIAGNOSIS — E1142 Type 2 diabetes mellitus with diabetic polyneuropathy: Secondary | ICD-10-CM | POA: Diagnosis not present

## 2023-05-17 DIAGNOSIS — S82891S Other fracture of right lower leg, sequela: Secondary | ICD-10-CM | POA: Diagnosis not present

## 2023-05-17 LAB — GLUCOSE, CAPILLARY
Glucose-Capillary: 157 mg/dL — ABNORMAL HIGH (ref 70–99)
Glucose-Capillary: 160 mg/dL — ABNORMAL HIGH (ref 70–99)
Glucose-Capillary: 199 mg/dL — ABNORMAL HIGH (ref 70–99)
Glucose-Capillary: 122 mg/dL — ABNORMAL HIGH (ref 70–99)

## 2023-05-17 MED ORDER — AMOXICILLIN-POT CLAVULANATE 875-125 MG PO TABS
1.0000 | ORAL_TABLET | Freq: Two times a day (BID) | ORAL | Status: DC
  Administered 2023-05-17 – 2023-05-20 (×7): 1 via ORAL
  Filled 2023-05-17 (×7): qty 1

## 2023-05-17 NOTE — Plan of Care (Signed)

## 2023-05-17 NOTE — Plan of Care (Signed)
  Problem: Education: Goal: Knowledge of General Education information will improve Description Including pain rating scale, medication(s)/side effects and non-pharmacologic comfort measures Outcome: Progressing   Problem: Clinical Measurements: Goal: Ability to maintain clinical measurements within normal limits will improve Outcome: Progressing   Problem: Activity: Goal: Risk for activity intolerance will decrease Outcome: Progressing   

## 2023-05-17 NOTE — TOC Progression Note (Signed)
Transition of Care [facility]) - Progression Note    Patient Details  Name: Andrea [name] MRN: [id number] Date of Birth: 1981/05/14  Transition of [facility]) CM/SW Contact  Susa Simmonds, [place] Phone Number: 05/17/2023, 2:44 PM  Clinical Narrative:   Jill Side with [facility] can accept patient. Jill Side states auth cannot be started until Monday. Patient accepted bed offer.     Expected Discharge Plan: Skilled Nursing Facility Barriers to Discharge: Insurance Authorization, SNF Pending bed offer  Expected Discharge Plan and Services       Living arrangements for the past 2 months: Apartment                                       Social Determinants of Health (SDOH) Interventions SDOH Screenings   Food Insecurity: No Food Insecurity (05/12/2023)  Housing: Low Risk  (05/12/2023)  Transportation Needs: No Transportation Needs (05/12/2023)  Utilities: Not At Risk (05/12/2023)  Alcohol Screen: Low Risk  (04/24/2017)  Depression (PHQ2-9): High Risk (03/25/2022)  Tobacco Use: Low Risk  (05/13/2023)    Readmission Risk Interventions     No data to display

## 2023-05-17 NOTE — Progress Notes (Signed)
Progress Note   Patient: Andrea [name] [id number]:[id number] DOB: 1980-12-13 DOA: 05/12/2023     5 DOS: the patient was seen and examined on 05/17/2023   Brief hospital course: Andrea [name] is a 42 y.o. female with medical history significant for obesity, hypertension, bipolar disorder, schizophrenia, type II DM, recent right ankle fracture, who was referred from the podiatrist office to the emergency department because of nonhealing right ankle fracture. She had a right ankle fracture 05/03/2023 and underwent reduction of right ankle fracture on 05/03/2023.   Patient is seen by podiatry who took her to the OR 05/13/23 for hardware removal, right ankle tibiotalar calcaneal arthrodesis.   Assessment and plan: Nonhealing right ankle fracture: Status post right ankle tibiotalar calcaneal arthrodesis, hardware removal. Pain better on dilaudid and oxycodone. Continue muscle relaxants. Ancef changed to Augmentin from tomorrow to finish 10 days of abx. Podiatry follow-up, removed her dressing 05/16/23, advised skilled nursing facility as she is nonweightbearing right lower extremity. Right leg Uls negative for DVT. Eliquis resumed for DVT prophylaxis. TOC working on [employer] placement.  Bipolar disorder, schizophrenia Continue Cymbalta, Seroquel, Zonegran.  Hypertension: Blood pressure stable.  Continue prazosin at night.  Mild intermittent asthma: No exacerbation noted.  Continue home inhalers, bronchodilator therapy.  Type 2 diabetes mellitus: Recent A1c 6.4 Continue Accu-Cheks, sliding scale insulin. Cardiac, carb consistent diet.  Obesity with BMI 38.35 Contributing to her current condition Diet, exercise and weight reduction advised.  Out of bed to chair. Incentive spirometry. Nursing supportive care. Fall, aspiration precautions. DVT prophylaxis   Code Status: Full Code  Subjective: Patient is seen and examined today morning.  Patient is lying in bed comfortably, no pain.  Does have constipation.  Physical Exam: Vitals:   05/16/23 0859 05/16/23 1720 05/17/23 0036 05/17/23 0850  BP: 103/66 129/79 124/75 116/72  Pulse: (!) 103 (!) 105 91 96  Resp: 16 19 18 18   Temp: (!) 97.4 F (36.3 C) 97.6 F (36.4 C) 97.8 F (36.6 C) 98.3 F (36.8 C)  TempSrc:   Oral Oral  SpO2: 94% 95% 92% 93%  Weight:      Height:        General - Young  obese Caucasian female, no acute distress HEENT - PERRLA, EOMI, atraumatic head, non tender sinuses. Lung - Clear, bibasal rales, no rhonchi, wheezes. Heart - S1, S2 heard, no murmurs, rubs, trace pedal edema. Abdomen - Soft, non tender, obese, bowel sounds good Neuro - Alert, awake and oriented x 3, non focal exam. Skin - Warm and dry.  Right leg/ankle dressing noted  Data Reviewed:      Latest Ref Rng & Units 05/14/2023    5:18 AM 05/12/2023    1:29 PM 05/04/2023    5:51 AM  CBC  WBC 4.0 - 10.5 K/uL 10.7  9.7  13.1   Hemoglobin 12.0 - 15.0 g/dL 78.4  69.6  29.5   Hematocrit 36.0 - 46.0 % 33.9  38.5  37.9   Platelets 150 - 400 K/uL 232  251  261       Latest Ref Rng & Units 05/12/2023    1:29 PM 05/04/2023    5:51 AM 05/03/2023    4:20 AM  BMP  Glucose 70 - 99 mg/dL 284  132  440   BUN 6 - 20 mg/dL 27  18  21    Creatinine 0.44 - 1.00 mg/dL 1.02  7.25  3.66   Sodium 135 - 145 mmol/L 136  139  139  Potassium 3.5 - 5.1 mmol/L 3.6  3.8  4.0   Chloride 98 - 111 mmol/L 102  103  104   CO2 22 - 32 mmol/L 23  24  26    Calcium 8.9 - 10.3 mg/dL 9.3  9.0  9.1    US Venous Img Lower Unilateral Right (DVT)  Result Date: 05/16/2023 CLINICAL DATA:  Right lower extremity pain and edema. Ankle surgery 2 weeks ago. Evaluate for DVT. EXAM: RIGHT LOWER EXTREMITY VENOUS DOPPLER ULTRASOUND TECHNIQUE: Gray-scale sonography with graded compression, as well as color Doppler and duplex ultrasound were performed to evaluate the lower extremity deep venous systems from the level of the common femoral vein and including the common  femoral, femoral, profunda femoral, popliteal and calf veins including the posterior tibial, peroneal and gastrocnemius veins when visible. The superficial great saphenous vein was also interrogated. Spectral Doppler was utilized to evaluate flow at rest and with distal augmentation maneuvers in the common femoral, femoral and popliteal veins. COMPARISON:  None Available. FINDINGS: Contralateral Common Femoral Vein: Respiratory phasicity is normal and symmetric with the symptomatic side. No evidence of thrombus. Normal compressibility. Common Femoral Vein: No evidence of thrombus. Normal compressibility, respiratory phasicity and response to augmentation. Saphenofemoral Junction: No evidence of thrombus. Normal compressibility and flow on color Doppler imaging. Profunda Femoral Vein: No evidence of thrombus. Normal compressibility and flow on color Doppler imaging. Femoral Vein: No evidence of thrombus. Normal compressibility, respiratory phasicity and response to augmentation. Popliteal Vein: No evidence of thrombus. Normal compressibility, respiratory phasicity and response to augmentation. Calf Veins: No evidence of thrombus. Normal compressibility and flow on color Doppler imaging. Superficial Great Saphenous Vein: No evidence of thrombus. Normal compressibility. Other Findings:  None. IMPRESSION: No evidence of DVT within the right lower extremity. Electronically Signed   By: Simonne Come M.D.   On: 05/16/2023 15:58     Family Communication: Discussed with patient, she understand and agree. All questions answereed.  Disposition: Status is: Inpatient Remains inpatient appropriate because: s/p tibiot talo calcaneal fusion with IM nail right ankle, need SNF placement.  Planned Discharge Destination: Skilled nursing facility     Time spent: 36 minutes  Author: Marcelino Duster, MD 05/17/2023 12:50 PM Secure chat 7am to 7pm For on call review [email or web address].

## 2023-05-17 NOTE — TOC Progression Note (Signed)
Transition of Care [facility]) - Progression Note    Patient Details  Name: Andrea [name] MRN: [id number] Date of Birth: [birth year]  Transition of Care [facility]) CM/SW Contact  Susa Simmonds, [place] Phone Number: 05/17/2023, 9:28 AM  Clinical Narrative: CSW spoke with patient to let her know about the SNF referrals. CSW told patient the only offer at this time is [facility]. CSW told patient she would follow-up with admissions and confirm.    CSW sent a message to [facility] with admissions to confirm offer. CSW is awaiting a response back.       Expected Discharge Plan: Skilled Nursing Facility Barriers to Discharge: Insurance Authorization, SNF Pending bed offer  Expected Discharge Plan and Services       Living arrangements for the past 2 months: Apartment                                       Social Determinants of Health (SDOH) Interventions SDOH Screenings   Food Insecurity: No Food Insecurity (05/12/2023)  Housing: Low Risk  (05/12/2023)  Transportation Needs: No Transportation Needs (05/12/2023)  Utilities: Not At Risk (05/12/2023)  Alcohol Screen: Low Risk  (04/24/2017)  Depression (PHQ2-9): High Risk (03/25/2022)  Tobacco Use: Low Risk  (05/13/2023)    Readmission Risk Interventions     No data to display

## 2023-05-18 DIAGNOSIS — E1142 Type 2 diabetes mellitus with diabetic polyneuropathy: Secondary | ICD-10-CM | POA: Diagnosis not present

## 2023-05-18 DIAGNOSIS — M21171 Varus deformity, not elsewhere classified, right ankle: Secondary | ICD-10-CM | POA: Diagnosis not present

## 2023-05-18 DIAGNOSIS — S82891S Other fracture of right lower leg, sequela: Secondary | ICD-10-CM | POA: Diagnosis not present

## 2023-05-18 DIAGNOSIS — F319 Bipolar disorder, unspecified: Secondary | ICD-10-CM | POA: Diagnosis not present

## 2023-05-18 LAB — GLUCOSE, CAPILLARY
Glucose-Capillary: 179 mg/dL — ABNORMAL HIGH (ref 70–99)
Glucose-Capillary: 263 mg/dL — ABNORMAL HIGH (ref 70–99)
Glucose-Capillary: 122 mg/dL — ABNORMAL HIGH (ref 70–99)
Glucose-Capillary: 181 mg/dL — ABNORMAL HIGH (ref 70–99)

## 2023-05-18 MED ORDER — OXYCODONE HCL 5 MG PO TABS
10.0000 mg | ORAL_TABLET | Freq: Four times a day (QID) | ORAL | Status: DC | PRN
  Administered 2023-05-18 – 2023-05-20 (×5): 10 mg via ORAL
  Filled 2023-05-18 (×5): qty 2

## 2023-05-18 NOTE — Progress Notes (Signed)
1334 Pt states pain is 9/10 to right ankle. Per primary nurse pt is approved by MD to have 10mg  of oxycodone at this time.

## 2023-05-18 NOTE — Progress Notes (Signed)
Pt was sitting on the floor in the bathroom and being assisted by staff  when I entered the room. She stated that she had been trying to get someone to assist her but no one responded. The NT was assisting me with dressing a wound in a contact room when phone rang 2 times. I was unable to answer due to contact precautions.   3 staff member assisted patient to her feet and helped her onto the commode. She denies hitting her head or any injuries. She stated, "I landed on my butt but I'm ok."   Provider notified and pt was ordered to only get up to [facility] due to NWB status.

## 2023-05-18 NOTE — Progress Notes (Signed)
Progress Note   Patient: Andrea [name] YNW:[id number] DOB: [birth year] DOA: 05/12/2023     6 DOS: the patient was seen and examined on 05/18/2023   Brief hospital course: Andrea [name] is a 42 y.o. female with medical history significant for obesity, hypertension, bipolar disorder, schizophrenia, type II DM, recent right ankle fracture, who was referred from the podiatrist office to the emergency department because of nonhealing right ankle fracture. She had a right ankle fracture 05/03/2023 and underwent reduction of right ankle fracture on 05/03/2023.   Patient is seen by podiatry who took her to the OR 05/13/23 for hardware removal, right ankle tibiotalar calcaneal arthrodesis.   Assessment and plan: Nonhealing right ankle fracture: Status post right ankle tibiotalar calcaneal arthrodesis, hardware removal. Pain better on dilaudid and oxycodone. Continue muscle relaxants. Ancef changed to Augmentin from tomorrow to finish 10 days of abx. Podiatry follow-up, removed her dressing 05/16/23, advised skilled nursing facility as she is nonweightbearing right lower extremity. Right leg Uls negative for DVT. Eliquis resumed for DVT prophylaxis. Advised not to get out of bed with out help. Encouraged to use [facility] with support. TOC working on [employer] placement.  Bipolar disorder, schizophrenia Continue Cymbalta, Seroquel, Zonegran.  Hypertension: Blood pressure stable.  Continue prazosin at night.  Mild intermittent asthma: No exacerbation noted.  Continue home inhalers, bronchodilator therapy.  Type 2 diabetes mellitus: Recent A1c 6.4 Continue Accu-Cheks, sliding scale insulin. Cardiac, carb consistent diet.  Obesity with BMI 38.35 Contributing to her current condition Diet, exercise and weight reduction advised.  Out of bed to chair. Incentive spirometry. Nursing supportive care. Fall, aspiration precautions. DVT prophylaxis   Code Status: Full Code  Subjective: Patient is  seen and examined today morning.  Patient is lying in bed comfortably, no pain. Had bowel movements. RN notified that she had fallen this afternoon. No head trauma.  Physical Exam: Vitals:   05/17/23 2217 05/18/23 0751 05/18/23 1052 05/18/23 1123  BP: 128/67 (!) 143/83 105/74 92/61  Pulse: 96 91 (!) 108 88  Resp: 17 18  18   Temp: 98.3 F (36.8 C) 97.6 F (36.4 C)    TempSrc:  Oral    SpO2: 90% 94% 99% 95%  Weight:      Height:        General - Young  obese Caucasian female, no acute distress HEENT - PERRLA, EOMI, atraumatic head, non tender sinuses. Lung - Clear, bibasal rales, no rhonchi, wheezes. Heart - S1, S2 heard, no murmurs, rubs, trace pedal edema. Abdomen - Soft, non tender, obese, bowel sounds good Neuro - Alert, awake and oriented x 3, non focal exam. Skin - Warm and dry.  Right leg/ankle dressing noted  Data Reviewed:      Latest Ref Rng & Units 05/14/2023    5:18 AM 05/12/2023    1:29 PM 05/04/2023    5:51 AM  CBC  WBC 4.0 - 10.5 K/uL 10.7  9.7  13.1   Hemoglobin 12.0 - 15.0 g/dL 65.7  84.6  96.2   Hematocrit 36.0 - 46.0 % 33.9  38.5  37.9   Platelets 150 - 400 K/uL 232  251  261       Latest Ref Rng & Units 05/12/2023    1:29 PM 05/04/2023    5:51 AM 05/03/2023    4:20 AM  BMP  Glucose 70 - 99 mg/dL 952  841  324   BUN 6 - 20 mg/dL 27  18  21    Creatinine 0.44 - 1.00  mg/dL 0.10  2.72  5.36   Sodium 135 - 145 mmol/L 136  139  139   Potassium 3.5 - 5.1 mmol/L 3.6  3.8  4.0   Chloride 98 - 111 mmol/L 102  103  104   CO2 22 - 32 mmol/L 23  24  26    Calcium 8.9 - 10.3 mg/dL 9.3  9.0  9.1    No results found.   Family Communication: Discussed with patient, she understand and agree. All questions answereed.  Disposition: Status is: Inpatient Remains inpatient appropriate because: s/p tibiot talo calcaneal fusion with IM nail right ankle, need SNF placement.  Planned Discharge Destination: Skilled nursing facility     Time spent: 37  minutes  Author: Marcelino Duster, MD 05/18/2023 3:13 PM Secure chat 7am to 7pm For on call review [email or web address].

## 2023-05-18 NOTE — Progress Notes (Signed)
  Subjective:  Patient ID: Andrea [name], female    DOB: 01-21-1981,  MRN: [id number]  Patient seen at bedside POD #5 TTC fusion with IM nail right ankle 05/13/23  Patient seen resting comfortably bedside.  States pain to surgical site has been improving.  Splint to right lower extremity is clean dry and intact. She does report a fall going to the bathroom earlier this morning, denies any injuries. Did soil the ace wrap and splint at this time, however the underlying dressings appear clean.  Negative for chest pain and shortness of breath Fever: no Night sweats: no Constitutional signs: no Review of all other systems is negative Objective:   Vitals:   05/18/23 0751 05/18/23 1052  BP: (!) 143/83 105/74  Pulse: 91 (!) 108  Resp: 18   Temp: 97.6 F (36.4 C)   SpO2: 94% 99%   General AA&O x3. Normal mood and affect.  Vascular Dorsalis pedis and posterior tibial pulses 2/4 bilat. Brisk capillary refill to all digits. Pedal hair present. Edema improving  Neurologic Epicritic sensation grossly intact.  Dermatologic Surgical incision sites are noted with skin staples intact.  Skin edges well-approximated, well coapted.  No evidence of dehiscence or gapping appreciated.  Scant sanguinous drainage noted. Ecchymosis around incision sites, heel, appear improved.  Orthopedic: Lower extremity motor function intact        Assessment & Plan:  Patient was evaluated and treated and all questions answered.  POD #5 TTC fusion right ankle -Pain control today will defer to hospitalist on regimen -Continue restart Eliquis for DVT prophylaxis - Complete course 10 day course of antibiotics from 06/12/23. Transitioning to PO Augmentin -Venous ultrasound study negative for DVT -She will need SNF or rehab placement.  This is medically necessary for her to have a safe outcome and she should not go home directly from the hospital until she is able to safely maintain nonweightbearing  effectively -Reapplied Xeroform applied to right lower extremity incision lines, dressed with 4 x 4 gauze, ABD, Kerlix.  Posterior splint today was exchanged, padded well. -Care was taken to pad the posterior heel and leg today with ABDs -Follow-up with Dr. Annamary Rummage in [place] next Thursday will be scheduled  Barbaraann Share, DPM  Accessible via secure chat for questions or concerns.

## 2023-05-19 DIAGNOSIS — E669 Obesity, unspecified: Secondary | ICD-10-CM

## 2023-05-19 DIAGNOSIS — I10 Essential (primary) hypertension: Secondary | ICD-10-CM

## 2023-05-19 DIAGNOSIS — M21171 Varus deformity, not elsewhere classified, right ankle: Secondary | ICD-10-CM | POA: Diagnosis not present

## 2023-05-19 DIAGNOSIS — S82891S Other fracture of right lower leg, sequela: Secondary | ICD-10-CM | POA: Diagnosis not present

## 2023-05-19 DIAGNOSIS — F319 Bipolar disorder, unspecified: Secondary | ICD-10-CM

## 2023-05-19 DIAGNOSIS — E1142 Type 2 diabetes mellitus with diabetic polyneuropathy: Secondary | ICD-10-CM

## 2023-05-19 DIAGNOSIS — F411 Generalized anxiety disorder: Secondary | ICD-10-CM

## 2023-05-19 DIAGNOSIS — F410 Panic disorder [episodic paroxysmal anxiety] without agoraphobia: Secondary | ICD-10-CM

## 2023-05-19 LAB — GLUCOSE, CAPILLARY
Glucose-Capillary: 139 mg/dL — ABNORMAL HIGH (ref 70–99)
Glucose-Capillary: 170 mg/dL — ABNORMAL HIGH (ref 70–99)
Glucose-Capillary: 187 mg/dL — ABNORMAL HIGH (ref 70–99)
Glucose-Capillary: 163 mg/dL — ABNORMAL HIGH (ref 70–99)
Glucose-Capillary: 134 mg/dL — ABNORMAL HIGH (ref 70–99)

## 2023-05-19 NOTE — Progress Notes (Signed)
Progress Note   Patient: Andrea [name] EAV:[id number] DOB: [birth year] DOA: 05/12/2023     7 DOS: the patient was seen and examined on 05/19/2023   Brief hospital course: Andrea [name] is a 42 y.o. female with medical history significant for obesity, hypertension, bipolar disorder, schizophrenia, type II DM, recent right ankle fracture, who was referred from the podiatrist office to the emergency department because of nonhealing right ankle fracture. She had a right ankle fracture 05/03/2023 and underwent reduction of right ankle fracture on 05/03/2023.   Patient is seen by podiatry who took her to the OR 05/13/23 for hardware removal, right ankle tibiotalar calcaneal arthrodesis.   Assessment and plan: Nonhealing right ankle fracture: Status post right ankle tibiotalar calcaneal arthrodesis, hardware removal. Pain better on dilaudid and oxycodone. Continue muscle relaxants. Ancef changed to Augmentin to finish 10 days of abx. Podiatry follow-up, removed her dressing 05/16/23, advised skilled nursing facility as she is nonweightbearing right lower extremity. Right leg Uls negative for DVT. Eliquis resumed for DVT prophylaxis. Advised not to get out of bed with out help. Encouraged to use [facility] with support. TOC working on [employer] placement.  Bipolar disorder, schizophrenia Continue Cymbalta, Seroquel, Zonegran.  Hypertension: Blood pressure stable.  Continue prazosin at night.  Mild intermittent asthma: No exacerbation noted.  Continue home inhalers, bronchodilator therapy.  Type 2 diabetes mellitus: Recent A1c 6.4 Continue Accu-Cheks, sliding scale insulin. Cardiac, carb consistent diet.  Obesity with BMI 38.35 Contributing to her current condition Diet, exercise and weight reduction advised.  Out of bed to chair. Incentive spirometry. Nursing supportive care. Fall, aspiration precautions. DVT prophylaxis   Code Status: Full Code  Subjective: Patient is seen and  examined today morning.  Patient is lying in bed comfortably, no pain. Had bowel movements. Eating fair. Parents at bedside.  Physical Exam: Vitals:   05/18/23 1652 05/18/23 2145 05/19/23 0521 05/19/23 0806  BP: 105/76 113/75 107/73 98/60  Pulse: 87 88 73 74  Resp: 18 19 18 17   Temp: 97.6 F (36.4 C) 98.3 F (36.8 C) 97.9 F (36.6 C) 97.8 F (36.6 C)  TempSrc: Oral Oral Oral   SpO2: 94% 98% 97% 95%  Weight:      Height:        General - Young  obese Caucasian female, no acute distress HEENT - PERRLA, EOMI, atraumatic head, non tender sinuses. Lung - Clear, bibasal rales, no rhonchi, wheezes. Heart - S1, S2 heard, no murmurs, rubs, trace pedal edema. Abdomen - Soft, non tender, obese, bowel sounds good Neuro - Alert, awake and oriented x 3, non focal exam. Skin - Warm and dry.  Right leg/ankle dressing noted  Data Reviewed:      Latest Ref Rng & Units 05/14/2023    5:18 AM 05/12/2023    1:29 PM 05/04/2023    5:51 AM  CBC  WBC 4.0 - 10.5 K/uL 10.7  9.7  13.1   Hemoglobin 12.0 - 15.0 g/dL 78.2  95.6  21.3   Hematocrit 36.0 - 46.0 % 33.9  38.5  37.9   Platelets 150 - 400 K/uL 232  251  261       Latest Ref Rng & Units 05/12/2023    1:29 PM 05/04/2023    5:51 AM 05/03/2023    4:20 AM  BMP  Glucose 70 - 99 mg/dL 086  578  469   BUN 6 - 20 mg/dL 27  18  21    Creatinine 0.44 - 1.00 mg/dL 6.29  5.28  0.89   Sodium 135 - 145 mmol/L 136  139  139   Potassium 3.5 - 5.1 mmol/L 3.6  3.8  4.0   Chloride 98 - 111 mmol/L 102  103  104   CO2 22 - 32 mmol/L 23  24  26    Calcium 8.9 - 10.3 mg/dL 9.3  9.0  9.1    No results found.   Family Communication: Discussed with family at bedside, they understand and agree. All questions answereed.  Disposition: Status is: Inpatient Remains inpatient appropriate because: s/p tibiot talo calcaneal fusion with IM nail right ankle, need SNF placement.  Planned Discharge Destination: Skilled nursing facility     Time spent: 36  minutes  Author: Marcelino Duster, MD 05/19/2023 3:26 PM Secure chat 7am to 7pm For on call review [email or web address].

## 2023-05-19 NOTE — Progress Notes (Signed)
  Subjective:  Patient ID: Andrea [name], female    DOB: 01-May-1981,  MRN: [id number]  Patient seen at bedside POD #6 TTC fusion with IM nail right ankle 05/13/23  Patient seen resting comfortably bedside.  States pain improved today. Aware of plans for follow up. Confirms NWB status to RLE.   Negative for chest pain and shortness of breath Fever: no Night sweats: no Constitutional signs: no Review of all other systems is negative Objective:   Vitals:   05/19/23 0521 05/19/23 0806  BP: 107/73 98/60  Pulse: 73 74  Resp: 18 17  Temp: 97.9 F (36.6 C) 97.8 F (36.6 C)  SpO2: 97% 95%   General AA&O x3. Normal mood and affect.  Vascular Brisk capillary refill to all digits. Pedal hair present. Edema improving  Neurologic Epicritic sensation grossly intact.  Dermatologic Dressing C/D/I  Orthopedic: Lower extremity motor function intact      Assessment & Plan:  Patient was evaluated and treated and all questions answered.  POD #6 TTC fusion right ankle -Pain control improved today, denies pain at current moment - Eliquis for DVT prophylaxis for 8 weeks from OR - Complete course 10 day course of antibiotics from 06/12/23. PO Augmentin BID -Venous ultrasound study negative for DVT -She will need SNF or rehab placement.  This is medically necessary for her to have a safe outcome and she should not go home directly from the hospital until she is able to safely maintain nonweightbearing effectively -Dressing to remain intact until follow up later this week or early next  -Follow-up with myself in Office this Thursday 12/5 or next Tuesday 12/10  Pilar Plate, DPM  Accessible via secure chat for questions or concerns.

## 2023-05-19 NOTE — Progress Notes (Signed)
Physical Therapy Treatment Patient Details Name: Andrea [name] MRN: [id number] DOB: 01-08-1981 Today's Date: 05/19/2023   History of Present Illness Patient is 41 y/o admitted 05/12/23 for closed right ankle fracture. Pt was last here 11/16 found to have right ankle fracture s/p ORIF. Procedure for most recent admission dated 11/26 for (1)removal of deep hardware in right ankle (2)Tibiotalarcalcalneal arthrodesis of right ankle. PmHx includes: Diabetes, HTN, bipolar, and schizophrenia.    PT Comments  Pt was pleasant and motivated to participate during the session and put forth good effort throughout. Pt was able to perform bed mobility with Mod I for extra time. Pt performed transfers with CGA for safety, exhibiting use of momentum to complete. Pt amb in room with RW and CGA, using hop-to gait pattern but limited to very short distances, pt requesting to sit. Pt then performed seated exercises for RLE in chair, reporting some increase in discomfort but tolerating well. Pt's vitals were monitored during session and remained WNL, pt reporting no adverse symptoms other than pain throughout. Pt will benefit from continued PT services upon discharge to safely address deficits listed in patient problem list for decreased caregiver assistance and eventual return to PLOF.     If plan is discharge home, recommend the following: A little help with bathing/dressing/bathroom;Assist for transportation;Help with stairs or ramp for entrance;Assistance with cooking/housework   Can travel by private vehicle     No  Equipment Recommendations  Other (comment) (TBD at next venue of care)    Recommendations for Other Services       Precautions / Restrictions Precautions Precautions: Fall Required Braces or Orthoses: Splint/Cast Restrictions Weight Bearing Restrictions: Yes RLE Weight Bearing: Non weight bearing     Mobility  Bed Mobility Overal bed mobility: Modified Independent Bed Mobility:  Supine to Sit     Supine to sit: Modified independent (Device/Increase time), HOB elevated     General bed mobility comments: extra time for RLE management, no cuing needed    Transfers Overall transfer level: Needs assistance Equipment used: Rolling walker (2 wheels) Transfers: Sit to/from Stand Sit to Stand: Contact guard assist, From elevated surface           General transfer comment: slightly elevated bed height, good BUE use/placement without cuing, self-selected use of momentum to complete, minimal unsteadiness upon standing, cuing for upright posture upon standing    Ambulation/Gait Ambulation/Gait assistance: Contact guard assist Gait Distance (Feet): 8 Feet Assistive device: Rolling walker (2 wheels) Gait Pattern/deviations:  (hop-to pattern) Gait velocity: decreased     General Gait Details: greatly decreased "step" lengths, slow, inconsistent cadence with gait at EOB or with chair follow for safety, heavy BUE reliance on RW, exhibiting lack of confidence throughout, able to perform forward, sideways, and backward   Stairs             Wheelchair Mobility     Tilt Bed    Modified Rankin (Stroke Patients Only)       Balance Overall balance assessment: Needs assistance Sitting-balance support: Feet supported, No upper extremity supported Sitting balance-Leahy Scale: Good     Standing balance support: Bilateral upper extremity supported, Reliant on assistive device for balance, During functional activity Standing balance-Leahy Scale: Fair Standing balance comment: steady in static stand, mild-mod unsteadiness with dynamic activities with RW                            Cognition Arousal: Alert Behavior During Therapy:  WFL for tasks assessed/performed Overall Cognitive Status: Within Functional Limits for tasks assessed                                          Exercises Total Joint Exercises Long Arc Quad: AROM,  Right, 20 reps, Seated, Other (comment) (light resistance) Knee Flexion: AROM, Right, 20 reps, Seated (light resistance; pt reporting occasional twitching/spasm)    General Comments        Pertinent Vitals/Pain Pain Assessment Pain Assessment: 0-10 Pain Score: 7  Pain Location: R ankle Pain Descriptors / Indicators: Aching, Discomfort Pain Intervention(s): Monitored during session, Repositioned    Home Living                          Prior Function            PT Goals (current goals can now be found in the care plan section) Acute Rehab PT Goals Patient Stated Goal: Go to rehab so I can do stairs to get into my apartment. PT Goal Formulation: With patient Time For Goal Achievement: 05/29/23 Potential to Achieve Goals: Fair Progress towards PT goals: Progressing toward goals    Frequency    Min 1X/week      PT Plan      Co-evaluation              AM-PAC PT "6 Clicks" Mobility   Outcome Measure  Help needed turning from your back to your side while in a flat bed without using bedrails?: None Help needed moving from lying on your back to sitting on the side of a flat bed without using bedrails?: A Little Help needed moving to and from a bed to a chair (including a wheelchair)?: A Little Help needed standing up from a chair using your arms (e.g., wheelchair or bedside chair)?: A Little Help needed to walk in hospital room?: A Little Help needed climbing 3-5 steps with a railing? : Total 6 Click Score: 17    End of Session Equipment Utilized During Treatment: Gait belt Activity Tolerance: Patient tolerated treatment well Patient left: in chair;with call bell/phone within reach;with chair alarm set Nurse Communication: Mobility status PT Visit Diagnosis: Difficulty in walking, not elsewhere classified (R26.2);Other abnormalities of gait and mobility (R26.89)     Time: 8119-1478 PT Time Calculation (min) (ACUTE ONLY): 24 min  Charges:                            Rosiland Oz SPT 05/19/23, 1:59 PM

## 2023-05-19 NOTE — TOC Progression Note (Signed)
Transition of Care [facility]) - Progression Note    Patient Details  Name: Andrea [name] MRN: [id number] Date of Birth: Aug 08, 1980  [facility]) CM/SW Contact  Marlowe Sax, RN Phone Number: 05/19/2023, 1:59 PM  Clinical Narrative:     Reached out to Revonda Standard at [facility] to follow up on Insurance auth to see if it was approved, waiting on a response  Expected Discharge Plan: Skilled Nursing Facility Barriers to Discharge: English as a second language teacher, SNF Pending bed offer  Expected Discharge Plan and Services       Living arrangements for the past 2 months: Apartment                                       Social Determinants of Health (SDOH) Interventions SDOH Screenings   Food Insecurity: No Food Insecurity (05/12/2023)  Housing: Low Risk  (05/12/2023)  Transportation Needs: No Transportation Needs (05/12/2023)  Utilities: Not At Risk (05/12/2023)  Alcohol Screen: Low Risk  (04/24/2017)  Depression (PHQ2-9): High Risk (03/25/2022)  Tobacco Use: Low Risk  (05/13/2023)    Readmission Risk Interventions     No data to display

## 2023-05-20 DIAGNOSIS — M799 Soft tissue disorder, unspecified: Secondary | ICD-10-CM | POA: Insufficient documentation

## 2023-05-20 DIAGNOSIS — F411 Generalized anxiety disorder: Secondary | ICD-10-CM

## 2023-05-20 DIAGNOSIS — S82891S Other fracture of right lower leg, sequela: Secondary | ICD-10-CM | POA: Diagnosis not present

## 2023-05-20 DIAGNOSIS — M21171 Varus deformity, not elsewhere classified, right ankle: Secondary | ICD-10-CM | POA: Diagnosis not present

## 2023-05-20 DIAGNOSIS — F410 Panic disorder [episodic paroxysmal anxiety] without agoraphobia: Secondary | ICD-10-CM | POA: Insufficient documentation

## 2023-05-20 DIAGNOSIS — R269 Unspecified abnormalities of gait and mobility: Secondary | ICD-10-CM | POA: Insufficient documentation

## 2023-05-20 DIAGNOSIS — S82891G Other fracture of right lower leg, subsequent encounter for closed fracture with delayed healing: Secondary | ICD-10-CM | POA: Insufficient documentation

## 2023-05-20 DIAGNOSIS — F3164 Bipolar disorder, current episode mixed, severe, with psychotic features: Secondary | ICD-10-CM | POA: Insufficient documentation

## 2023-05-20 DIAGNOSIS — M6281 Muscle weakness (generalized): Secondary | ICD-10-CM | POA: Insufficient documentation

## 2023-05-20 DIAGNOSIS — S82851S Displaced trimalleolar fracture of right lower leg, sequela: Secondary | ICD-10-CM | POA: Insufficient documentation

## 2023-05-20 DIAGNOSIS — F319 Bipolar disorder, unspecified: Secondary | ICD-10-CM

## 2023-05-20 DIAGNOSIS — S82853A Displaced trimalleolar fracture of unspecified lower leg, initial encounter for closed fracture: Secondary | ICD-10-CM | POA: Insufficient documentation

## 2023-05-20 DIAGNOSIS — E1142 Type 2 diabetes mellitus with diabetic polyneuropathy: Secondary | ICD-10-CM | POA: Diagnosis not present

## 2023-05-20 DIAGNOSIS — F209 Schizophrenia, unspecified: Secondary | ICD-10-CM | POA: Insufficient documentation

## 2023-05-20 DIAGNOSIS — F331 Major depressive disorder, recurrent, moderate: Secondary | ICD-10-CM | POA: Insufficient documentation

## 2023-05-20 DIAGNOSIS — Z9889 Other specified postprocedural states: Secondary | ICD-10-CM | POA: Insufficient documentation

## 2023-05-20 DIAGNOSIS — T819XXA Unspecified complication of procedure, initial encounter: Secondary | ICD-10-CM | POA: Insufficient documentation

## 2023-05-20 DIAGNOSIS — M19079 Primary osteoarthritis, unspecified ankle and foot: Secondary | ICD-10-CM | POA: Insufficient documentation

## 2023-05-20 DIAGNOSIS — E1169 Type 2 diabetes mellitus with other specified complication: Secondary | ICD-10-CM | POA: Insufficient documentation

## 2023-05-20 LAB — BASIC METABOLIC PANEL
Anion gap: 9 (ref 5–15)
BUN: 16 mg/dL (ref 6–20)
CO2: 25 mmol/L (ref 22–32)
Calcium: 9.1 mg/dL (ref 8.9–10.3)
Chloride: 103 mmol/L (ref 98–111)
Creatinine, Ser: 0.78 mg/dL (ref 0.44–1.00)
GFR, Estimated: >60 mL/min (ref >60)
Glucose, Bld: 141 mg/dL — ABNORMAL HIGH (ref 70–99)
Potassium: 3.3 mmol/L — ABNORMAL LOW (ref 3.5–5.1)
Sodium: 137 mmol/L (ref 135–145)

## 2023-05-20 LAB — CBC
HCT: 32 % — ABNORMAL LOW (ref 36.0–46.0)
Hemoglobin: 10.3 g/dL — ABNORMAL LOW (ref 12.0–15.0)
MCH: 29.3 pg (ref 26.0–34.0)
MCHC: 32.2 g/dL (ref 30.0–36.0)
MCV: 90.9 fL (ref 80.0–100.0)
Platelets: 226 10*3/uL (ref 150–400)
RBC: 3.52 MIL/uL — ABNORMAL LOW (ref 3.87–5.11)
RDW: 12.9 % (ref 11.5–15.5)
WBC: 8.1 10*3/uL (ref 4.0–10.5)
nRBC: 0 % (ref 0.0–0.2)

## 2023-05-20 LAB — GLUCOSE, CAPILLARY
Glucose-Capillary: 161 mg/dL — ABNORMAL HIGH (ref 70–99)
Glucose-Capillary: 170 mg/dL — ABNORMAL HIGH (ref 70–99)

## 2023-05-20 MED ORDER — AMOXICILLIN-POT CLAVULANATE 875-125 MG PO TABS: 1.0000 | ORAL_TABLET | Freq: Two times a day (BID) | ORAL | 0 refills | Status: AC

## 2023-05-20 NOTE — TOC Progression Note (Signed)
Transition of Care [facility]) - Progression Note    Patient Details  Name: Andrea [name] MRN: [id number] Date of Birth: [birth year]  Transition of Care [facility]) CM/SW Contact  Marlowe Sax, RN Phone Number: 05/20/2023, 11:27 AM  Clinical Narrative:    Ins auth approved to go to [employer], they can accept her today   Expected Discharge Plan: Skilled Nursing Facility Barriers to Discharge: English as a second language teacher, SNF Pending bed offer  Expected Discharge Plan and Services       Living arrangements for the past 2 months: Apartment                                       Social Determinants of Health (SDOH) Interventions SDOH Screenings   Food Insecurity: No Food Insecurity (05/12/2023)  Housing: Low Risk  (05/12/2023)  Transportation Needs: No Transportation Needs (05/12/2023)  Utilities: Not At Risk (05/12/2023)  Alcohol Screen: Low Risk  (04/24/2017)  Depression (PHQ2-9): High Risk (03/25/2022)  Tobacco Use: Low Risk  (05/13/2023)    Readmission Risk Interventions     No data to display

## 2023-05-20 NOTE — Plan of Care (Signed)

## 2023-05-20 NOTE — Plan of Care (Signed)

## 2023-05-20 NOTE — TOC Progression Note (Signed)
Transition of Care [facility]) - Progression Note    Patient Details  Name: LONDEN KONKEL MRN: [id number] Date of Birth: [birth year]  Transition of Care [facility]) CM/SW Contact  Marlowe Sax, RN Phone Number: 05/20/2023, 12:57 PM  Clinical Narrative:     The patient is going to room [facility]B at [facility], she will notify her family Ems called to arrange transport   Expected Discharge Plan: Skilled Nursing Facility Barriers to Discharge: Insurance Authorization, SNF Pending bed offer  Expected Discharge Plan and Services       Living arrangements for the past 2 months: Apartment Expected Discharge Date: 05/20/23                                     Social Determinants of Health (SDOH) Interventions SDOH Screenings   Food Insecurity: No Food Insecurity (05/12/2023)  Housing: Low Risk  (05/12/2023)  Transportation Needs: No Transportation Needs (05/12/2023)  Utilities: Not At Risk (05/12/2023)  Alcohol Screen: Low Risk  (04/24/2017)  Depression (PHQ2-9): High Risk (03/25/2022)  Tobacco Use: Low Risk  (05/13/2023)    Readmission Risk Interventions     No data to display

## 2023-05-20 NOTE — Discharge Summary (Signed)
Physician Discharge Summary   Patient: Andrea [name] MRN: [id number] DOB: 06-19-1980  Admit date:     05/12/2023  Discharge date: 05/20/23  Discharge Physician: Marcelino Duster   PCP: Golden Pop, FNP   Recommendations at discharge:    PCP follow up in 1 week. Orthopedic surgery follow up as scheduled.  Discharge Diagnoses: Principal Problem:   Closed right ankle fracture Active Problems:   Type 2 diabetes mellitus with peripheral neuropathy (HCC)   HTN (hypertension)   Generalized anxiety disorder with panic attacks   Bipolar 1 disorder (HCC)   Mild persistent asthma   Postoperative surgical complication involving musculoskeletal system associated with musculoskeletal procedure   Acquired varus deformity of right ankle   Obesity (BMI 30-39.9)  Resolved Problems:   * No resolved hospital problems. *  Hospital Course: Andrea [name] is a 42 y.o. female with medical history significant for obesity, hypertension, bipolar disorder, schizophrenia, type II DM, recent right ankle fracture, who was referred from the podiatrist office to the emergency department because of nonhealing right ankle fracture. She had a right ankle fracture 05/03/2023 and underwent reduction of right ankle fracture on 05/03/2023.    Patient is seen by podiatry who took her to the OR 05/13/23 for hardware removal, right ankle tibiotalar calcaneal arthrodesis.   Assessment and Plan: Nonhealing right ankle fracture: Status post right ankle tibiotalar calcaneal arthrodesis, hardware removal. Pain better on dilaudid and oxycodone. Continue muscle relaxants. Ancef changed to Augmentin to finish 10 days of abx. Podiatry follow-up, removed her dressing 05/16/23, advised skilled nursing facility as she is nonweightbearing right lower extremity. Right leg Uls negative for DVT.  Eliquis resumed for DVT prophylaxis. Stable to be discharged to SNF placement.   Bipolar disorder,  schizophrenia Continue Cymbalta, Seroquel, Zonegran.   Hypertension: Blood pressure stable.  Continue prazosin at night.   Mild intermittent asthma: No exacerbation noted.  Continue home inhalers, bronchodilator therapy.   Type 2 diabetes mellitus: Recent A1c 6.4 Continue Accu-Cheks, sliding scale insulin. Home diabetic medications resumed. Cardiac, carb consistent diet.   Obesity with BMI 38.35 Contributing to her current condition Diet, exercise and weight reduction advised.        Consultants: Podiatry Procedures performed: TTC fusion right ankle   Disposition: Skilled nursing facility Diet recommendation:  Discharge Diet Orders (From admission, onward)     Start     Ordered   05/20/23 0000  Diet - low sodium heart healthy        05/20/23 1246   05/20/23 0000  Diet Carb Modified        05/20/23 1246           Cardiac and Carb modified diet DISCHARGE MEDICATION: Allergies as of 05/20/2023       Reactions   Bactrim [sulfamethoxazole-trimethoprim] Hives, Itching   Codeine Hives, Itching        Medication List     STOP taking these medications    ibuprofen 800 MG tablet Commonly known as: ADVIL   nabumetone 750 MG tablet Commonly known as: RELAFEN   ondansetron 4 MG disintegrating tablet Commonly known as: ZOFRAN-ODT   spironolactone 25 MG tablet Commonly known as: ALDACTONE   SUMAtriptan 100 MG tablet Commonly known as: IMITREX       TAKE these medications    Airsupra 90-80 MCG/ACT Aero Generic drug: Albuterol-Budesonide   albuterol (2.5 MG/3ML) 0.083% nebulizer solution Commonly known as: PROVENTIL Take 2.5 mg by nebulization every 4 (four) hours as needed.   amoxicillin-clavulanate  875-125 MG tablet Commonly known as: AUGMENTIN Take 1 tablet by mouth every 12 (twelve) hours for 2 days.   apixaban 2.5 MG Tabs tablet Commonly known as: Eliquis Take 1 tablet (2.5 mg total) by mouth 2 (two) times daily.   calcium carbonate 1500  (600 Ca) MG Tabs tablet Commonly known as: OSCAL Take 1,500 mg by mouth 2 (two) times daily with a meal.   DULoxetine 60 MG capsule Commonly known as: CYMBALTA Take 2 capsules (120 mg total) by mouth daily.   Emgality 120 MG/ML Soaj Generic drug: Galcanezumab-gnlm Inject 1 Pen into the skin every 30 (thirty) days.   famotidine 40 MG tablet Commonly known as: PEPCID Take 40 mg by mouth 2 (two) times daily.   gabapentin 300 MG capsule Commonly known as: Neurontin Take 1 pill each morning, one at noon, and 2 at night.   Gemtesa 75 MG Tabs Generic drug: Vibegron Take 1 tablet by mouth daily.   Jardiance 25 MG Tabs tablet Generic drug: empagliflozin Take 25 mg by mouth every morning.   levocetirizine 5 MG tablet Commonly known as: XYZAL Take 5 mg by mouth at bedtime.   meclizine 12.5 MG tablet Commonly known as: ANTIVERT Take 12.5 mg by mouth 3 (three) times daily.   naproxen sodium 220 MG tablet Commonly known as: ALEVE Take 440 mg by mouth 2 (two) times daily as needed.   pantoprazole 40 MG tablet Commonly known as: PROTONIX Take 40 mg by mouth every morning.   potassium chloride 10 MEQ CR capsule Commonly known as: MICRO-K Take 10 mEq by mouth daily.   prazosin 2 MG capsule Commonly known as: MINIPRESS Take 2 capsules (4 mg total) by mouth at bedtime.   QUEtiapine 50 MG tablet Commonly known as: SEROQUEL TAKE ONE TABLET IN THE MORNING, ONE AT MIDDAY, 2 AT NIGHT.   Qulipta 30 MG Tabs Generic drug: Atogepant Take 1 tablet daily   Rybelsus 14 MG Tabs Generic drug: Semaglutide Take 1 tablet by mouth daily.   Singulair 10 MG tablet Generic drug: montelukast Take 10 mg by mouth daily.   Symbicort 160-4.5 MCG/ACT inhaler Generic drug: budesonide-formoterol Inhale 2 puffs into the lungs.   tiZANidine 4 MG tablet Commonly known as: ZANAFLEX Take 4 mg by mouth every 8 (eight) hours as needed for muscle spasms.   traZODone 150 MG tablet Commonly known  as: DESYREL Take 150 mg by mouth at bedtime.   Vitamin D (Ergocalciferol) 1.25 MG (50000 UNIT) Caps capsule Commonly known as: DRISDOL Take 1 capsule (50,000 Units total) by mouth every 7 (seven) days.   zonisamide 100 MG capsule Commonly known as: ZONEGRAN Take 5 capsules every night        Follow-up Information     Tresa Res B, FNP Follow up in 1 week(s).   Specialty: Family Medicine Contact information: [address] [place] [address] [phone]         Pilar Plate, DPM Follow up in 1 week(s).   Specialty: Podiatry Contact information: [address] [place] [address] [phone]                Discharge Exam: Ceasar Mons Weights   05/12/23 1101 05/13/23 1134  Weight: 124.7 kg 124.7 kg   General - Young  obese Caucasian female, no acute distress HEENT - PERRLA, EOMI, atraumatic head, non tender sinuses. Lung - Clear, bibasal rales, no rhonchi, wheezes. Heart - S1, S2 heard, no murmurs, rubs, trace pedal edema. Abdomen - Soft,  non tender, obese, bowel sounds good Neuro - Alert, awake and oriented x 3, non focal exam. Skin - Warm and dry.  Right leg/ankle dressing noted  Condition at discharge: stable  The results of significant diagnostics from this hospitalization (including imaging, microbiology, ancillary and laboratory) are listed below for reference.   Imaging Studies: US Venous Img Lower Unilateral Right (DVT)  Result Date: 05/16/2023 CLINICAL DATA:  Right lower extremity pain and edema. Ankle surgery 2 weeks ago. Evaluate for DVT. EXAM: RIGHT LOWER EXTREMITY VENOUS DOPPLER ULTRASOUND TECHNIQUE: Gray-scale sonography with graded compression, as well as color Doppler and duplex ultrasound were performed to evaluate the lower extremity deep venous systems from the level of the common femoral vein and including the common femoral, femoral, profunda femoral, popliteal and calf veins including the posterior tibial,  peroneal and gastrocnemius veins when visible. The superficial great saphenous vein was also interrogated. Spectral Doppler was utilized to evaluate flow at rest and with distal augmentation maneuvers in the common femoral, femoral and popliteal veins. COMPARISON:  None Available. FINDINGS: Contralateral Common Femoral Vein: Respiratory phasicity is normal and symmetric with the symptomatic side. No evidence of thrombus. Normal compressibility. Common Femoral Vein: No evidence of thrombus. Normal compressibility, respiratory phasicity and response to augmentation. Saphenofemoral Junction: No evidence of thrombus. Normal compressibility and flow on color Doppler imaging. Profunda Femoral Vein: No evidence of thrombus. Normal compressibility and flow on color Doppler imaging. Femoral Vein: No evidence of thrombus. Normal compressibility, respiratory phasicity and response to augmentation. Popliteal Vein: No evidence of thrombus. Normal compressibility, respiratory phasicity and response to augmentation. Calf Veins: No evidence of thrombus. Normal compressibility and flow on color Doppler imaging. Superficial Great Saphenous Vein: No evidence of thrombus. Normal compressibility. Other Findings:  None. IMPRESSION: No evidence of DVT within the right lower extremity. Electronically Signed   By: Simonne Come M.D.   On: 05/16/2023 15:58   DG Tibia/Fibula Right  Result Date: 05/13/2023 CLINICAL DATA:  Postop. EXAM: RIGHT TIBIA AND FIBULA - 2 VIEW; RIGHT ANKLE - COMPLETE 3+ VIEW; RIGHT OS CALCIS - 2+ VIEW COMPARISON:  Preoperative imaging. FINDINGS: Interval distal fibular resection. Intramedullary nail with proximal and distal locking screw traversing the tibia, talus, and calcaneus. Interval medial tibial resection. Posterior splint material in place. Multifocal skin staples. IMPRESSION: Interval distal fibular resection and medial tibial resection. Ankle arthrodesis with intramedullary nail with traversing the tibia,  talus, and calcaneus. Electronically Signed   By: Narda Rutherford M.D.   On: 05/13/2023 17:41   DG Ankle Complete Right  Result Date: 05/13/2023 CLINICAL DATA:  Postop. EXAM: RIGHT TIBIA AND FIBULA - 2 VIEW; RIGHT ANKLE - COMPLETE 3+ VIEW; RIGHT OS CALCIS - 2+ VIEW COMPARISON:  Preoperative imaging. FINDINGS: Interval distal fibular resection. Intramedullary nail with proximal and distal locking screw traversing the tibia, talus, and calcaneus. Interval medial tibial resection. Posterior splint material in place. Multifocal skin staples. IMPRESSION: Interval distal fibular resection and medial tibial resection. Ankle arthrodesis with intramedullary nail with traversing the tibia, talus, and calcaneus. Electronically Signed   By: Narda Rutherford M.D.   On: 05/13/2023 17:41   DG Os Calcis Right  Result Date: 05/13/2023 CLINICAL DATA:  Postop. EXAM: RIGHT TIBIA AND FIBULA - 2 VIEW; RIGHT ANKLE - COMPLETE 3+ VIEW; RIGHT OS CALCIS - 2+ VIEW COMPARISON:  Preoperative imaging. FINDINGS: Interval distal fibular resection. Intramedullary nail with proximal and distal locking screw traversing the tibia, talus, and calcaneus. Interval medial tibial resection. Posterior splint material in place. Multifocal  skin staples. IMPRESSION: Interval distal fibular resection and medial tibial resection. Ankle arthrodesis with intramedullary nail with traversing the tibia, talus, and calcaneus. Electronically Signed   By: Narda Rutherford M.D.   On: 05/13/2023 17:41   DG Ankle 2 Views Right  Result Date: 05/13/2023 CLINICAL DATA:  Ankle arthrodesis, elective surgery. EXAM: RIGHT ANKLE - 2 VIEW COMPARISON:  Preoperative imaging. FINDINGS: Six fluoroscopic spot views of the ankle obtained in the operating room. Intramedullary nail traverses the calcaneus, talus, and distal tibia with 2 calcaneal screws. Fluoroscopy time 4 minutes 35 seconds. Dose 8.89 mGy. IMPRESSION: Intraoperative fluoroscopy during ankle surgery.  Electronically Signed   By: Narda Rutherford M.D.   On: 05/13/2023 17:40   DG C-Arm 1-60 Min-No Report  Result Date: 05/13/2023 Fluoroscopy was utilized by the requesting physician.  No radiographic interpretation.   DG C-Arm 1-60 Min-No Report  Result Date: 05/13/2023 Fluoroscopy was utilized by the requesting physician.  No radiographic interpretation.   DG C-Arm 1-60 Min-No Report  Result Date: 05/13/2023 Fluoroscopy was utilized by the requesting physician.  No radiographic interpretation.   CT Ankle Right Wo Contrast  Result Date: 05/12/2023 CLINICAL DATA:  Ankle trauma. Follow-up fracture. Status post ORIF 05/03/2023 orthopedic surgeon note describes today's x-rays show repeat fracture of the medial malleolus and pull out of hardware with ankle and 8-10 degrees of varus. Preoperative CT scan for revision versus removal and fusion right ankle pending results. EXAM: CT OF THE RIGHT ANKLE WITHOUT CONTRAST TECHNIQUE: Multidetector CT imaging of the right ankle was performed according to the standard protocol. Multiplanar CT image reconstructions were also generated. RADIATION DOSE REDUCTION: This exam was performed according to the departmental dose-optimization program which includes automated exposure control, adjustment of the mA and/or kV according to patient size and/or use of iterative reconstruction technique. COMPARISON:  Right ankle radiographs 05/12/2023, intraoperative fluoroscopy 05/03/2023, right ankle radiographs 05/03/2023; CT right ankle 05/03/2023 FINDINGS: Bones/Joint/Cartilage Interval hook plate and screw fixation of the previously seen displaced and angulated medial malleolar fracture. There is 7 mm of medial displacement of the superior aspect of the anterior distal medial malleolar fracture fragment (coronal series 6, image 58, axial series 3, image 82). There is mild apparent backing out of the far anterior medial malleolar screw by approximately 6 mm (coronal series 6,  image 59). This also seen on today's radiographs. There is improvement of the tibiotalar alignment compared to preoperative 05/03/2023 CT. There is mild widening of the anterior lateral aspect of the tibiotalar joint (coronal series 6, image 61). Lateral plate and screw fixation of the distal fibula. Persistent lucency within oblique distal fibular diaphyseal metaphyseal fracture. Oblique, mildly comminuted posterior malleolar fracture with mild improved alignment and up to 3 mm AP dimension diastasis (sagittal series 8, image 29). Mild-to-moderate posterior and mild plantar calcaneal heel spurs. 2 mm bone density dorsal to the talar neck (sagittal series 8, image 37) is unchanged from 05/03/2023, likely age-indeterminate avulsion injury. Ligaments Suboptimally assessed by CT. Muscles and Tendons Normal size and density of the regional musculature. No gross tendon tear is seen. Soft tissues Mild-to-moderate soft tissue swelling of the medial and lateral ankle and dorsal midfoot IMPRESSION: 1. Interval hook plate and screw fixation of the previously seen displaced and angulated medial malleolar fracture. There is 7 mm of medial displacement of the superior aspect of the anterior distal medial malleolar fracture fragment. There is mild apparent backing out of the associated far anterior medial malleolar screw by approximately 6 mm. This also seen  on today's radiographs. 2. Lateral plate and screw fixation of the distal fibula. Persistent lucency within oblique distal fibular diaphyseal metaphyseal fracture. 3. Oblique, mildly comminuted posterior malleolar fracture with mild improved alignment and up to 3 mm AP dimension diastasis. 4. There is a 2 mm bone density dorsal to the talar neck is unchanged from 05/03/2023, likely an age-indeterminate avulsion injury. Electronically Signed   By: Neita Garnet M.D.   On: 05/12/2023 12:32   DG Ankle Complete Right  Result Date: 05/12/2023 Please see detailed radiograph  report in office note.  DG Ankle 2 Views Right  Result Date: 05/03/2023 CLINICAL DATA:  Right ankle ORIF EXAM: RIGHT ANKLE - 2 VIEW COMPARISON:  Same-day x-ray FINDINGS: Four C-arm fluoroscopic images were obtained intraoperatively and submitted for post operative interpretation. Images obtained during right ankle ORIF with sideplate and screw fixation constructs of the distal tibia and fibula with transsyndesmotic screw fixation as well as a posterior to anterior partially threaded screw through the distal tibial plafond. Improved fracture alignment. 5 minutes and 39 seconds of fluoroscopy time utilized. Radiation dose: 17.48 mGy. Please see the performing provider's procedural report for further detail. IMPRESSION: Intraoperative fluoroscopic images obtained during right ankle ORIF. Electronically Signed   By: Duanne Guess D.O.   On: 05/03/2023 15:15   DG C-Arm 1-60 Min-No Report  Result Date: 05/03/2023 Fluoroscopy was utilized by the requesting physician.  No radiographic interpretation.   DG C-Arm 1-60 Min-No Report  Result Date: 05/03/2023 Fluoroscopy was utilized by the requesting physician.  No radiographic interpretation.   DG C-Arm 1-60 Min-No Report  Result Date: 05/03/2023 Fluoroscopy was utilized by the requesting physician.  No radiographic interpretation.   DG C-Arm 1-60 Min-No Report  Result Date: 05/03/2023 Fluoroscopy was utilized by the requesting physician.  No radiographic interpretation.   CT Ankle Right Wo Contrast  Result Date: 05/03/2023 CLINICAL DATA:  Trimalleolar fracture with posterior dislocation of the ankle. EXAM: CT OF THE RIGHT ANKLE WITHOUT CONTRAST TECHNIQUE: Multidetector CT imaging of the right ankle was performed according to the standard protocol. Multiplanar CT image reconstructions were also generated. RADIATION DOSE REDUCTION: This exam was performed according to the departmental dose-optimization program which includes automated exposure  control, adjustment of the mA and/or kV according to patient size and/or use of iterative reconstruction technique. COMPARISON:  Contemporaneous plain films. FINDINGS: Bones/Joint/Cartilage There is a comminuted oblique distal fibular fracture with the lower fracture margin at the level of the tibial plafond. The main distal fracture fragment is posteriorly displaced up to 1 shaft width, laterally displaced by nearly a shaft's width and with comminution fragments in between fracture margins as well as along the anterior margin of the proximal fragment. Transverse fracture medial malleolus is noted with small comminution in between the fracture margins, with the distal fragment medially displaced with respect to the parent bone up to the width of the malleolus and displaced from the adjacent talus up to 7 mm. Comminuted posterior malleolar fracture is noted. The main distal fragment distracted posteriorly up to 1 cm with comminution between fracture margins and about 5 mm cephalad displacement. Posterolateral subluxation of the talus and foot together is seen, with preservation of the normal talar dome contour. The posteromedial aspects of the tibial plafond abut the mid talar dome. The distal tibia and fibula above the fracture sites remain together with no significant separation. There is tibiotalar hemarthrosis. Small plantar and dorsal calcaneal spurring is noted and is noninflammatory. There are no further fractures. No evidence  of arthropathy or other focal bone abnormality. Ligaments Suboptimally assessed by CT. Muscles and Tendons Normal muscle bulk for age. No intramuscular hematoma. The tendons are not well studied with CT but are grossly intact as well as can be seen, including the peroneal and Achilles tendons. Soft tissues There is moderate edema at the ankle. No subcutaneous hematoma is seen. IMPRESSION: 1. Trimalleolar fracture dislocation of the ankle, with comminution and displacement of all 3  fracture fragments. 2. Posterolateral subluxation and mild apex lateral tilting of the talus and foot together. 3. Tibiotalar hemarthrosis. 4. Moderate soft tissue edema. 5. Calcaneal spurring. Electronically Signed   By: Almira Bar M.D.   On: 05/03/2023 06:41   DG Ankle Complete Right  Result Date: 05/03/2023 CLINICAL DATA:  Fall with ankle deformity EXAM: RIGHT ANKLE - COMPLETE 3 VIEW COMPARISON:  None ipsilateral FINDINGS: Oblique fracture through the distal fibular shaft. Horizontal fracture through the medial malleolus. Posterior dislocation of the ankle with displaced posterior malleolus fracture. No detected talus fracture or hindfoot subluxation. IMPRESSION: Trimalleolar fracture with posterior dislocation of the ankle. Electronically Signed   By: Tiburcio Pea M.D.   On: 05/03/2023 04:55    Microbiology: Results for orders placed or performed during the hospital encounter of 05/12/23  Surgical PCR screen     Status: None   Collection Time: 05/13/23  7:07 AM   Specimen: Nasal Mucosa; Nasal Swab  Result Value Ref Range Status   MRSA, PCR NEGATIVE NEGATIVE Final   Staphylococcus aureus NEGATIVE NEGATIVE Final    Comment: (NOTE) The Xpert SA Assay (FDA approved for NASAL specimens in patients 29 years of age and older), is one component of a comprehensive surveillance program. It is not intended to diagnose infection nor to guide or monitor treatment. Performed at [facility], [address] [place], [place] [address]     Labs: CBC: Recent Labs  Lab 05/14/23 0518 05/20/23 0410  WBC 10.7* 8.1  NEUTROABS 8.6*  --   HGB 11.1* 10.3*  HCT 33.9* 32.0*  MCV 91.6 90.9  PLT 232 226   Basic Metabolic Panel: Recent Labs  Lab 05/20/23 0410  NA 137  K 3.3*  CL 103  CO2 25  GLUCOSE 141*  BUN 16  CREATININE 0.78  CALCIUM 9.1   Liver Function Tests: No results for input(s): "AST", "ALT", "ALKPHOS", "BILITOT", "PROT", "ALBUMIN" in the last 168  hours. CBG: Recent Labs  Lab 05/19/23 1205 05/19/23 1703 05/19/23 2133 05/20/23 0739 05/20/23 1130  GLUCAP 187* 163* 134* 161* 170*    Discharge time spent: 38 minutes.  Signed: Marcelino Duster, MD [facility] 05/20/2023

## 2023-05-21 ENCOUNTER — Telehealth: Payer: Self-pay | Admitting: Podiatry

## 2023-05-21 ENCOUNTER — Encounter: Payer: Self-pay | Admitting: Podiatry

## 2023-05-21 NOTE — Telephone Encounter (Signed)
Completed and faxed FMLA docs for the patient to [facility] [phone])  ....   Called Ms. Finneran and advised same .Marland Kitchen...   J. Abbott -- 05/21/2023

## 2023-05-21 NOTE — Telephone Encounter (Signed)
Patient got a speeding ticket and was supposed to be in court today, but was not due to her foot.  She asked for a note from the doctor for the clerk of court as poof why she was not in court today.  Compiled a letter and faxed to the [facility] of Court this afternoon [phone]) ...  The patient supplied their fax# .Marland Kitchen...     J. Abbott -- 05/21/2023

## 2023-05-22 ENCOUNTER — Ambulatory Visit (INDEPENDENT_AMBULATORY_CARE_PROVIDER_SITE_OTHER): Payer: BC Managed Care – PPO | Admitting: Podiatry

## 2023-05-22 ENCOUNTER — Ambulatory Visit (INDEPENDENT_AMBULATORY_CARE_PROVIDER_SITE_OTHER): Payer: BC Managed Care – PPO

## 2023-05-22 DIAGNOSIS — Z8781 Personal history of (healed) traumatic fracture: Secondary | ICD-10-CM

## 2023-05-22 DIAGNOSIS — Z9889 Other specified postprocedural states: Secondary | ICD-10-CM

## 2023-05-22 DIAGNOSIS — S82851A Displaced trimalleolar fracture of right lower leg, initial encounter for closed fracture: Secondary | ICD-10-CM | POA: Diagnosis not present

## 2023-05-22 NOTE — Progress Notes (Signed)
  Subjective:  Patient ID: Andrea [name], female    DOB: Aug 05, 1980,  MRN: [id number]  DOS: 05/13/2023 Procedure: Right ankle hardware removal and tibiotalar calcaneal arthrodesis  42 y.o. female returns for post-op check.  Patient returns for first postop check after discharge from the hospital following above procedures on 05/13/2023 approximately 1 week postop.  Has been at a rehab facility.  Has maintain nonweightbearing status using a wheelchair at this time.  Dressings have remained intact since discharge from the hospital.   Review of Systems: Negative except as noted in the HPI. Denies N/V/F/Ch.   Objective:  There were no vitals filed for this visit. There is no height or weight on file to calculate BMI. Constitutional Well developed. Well nourished.  Vascular Foot warm and well perfused. Capillary refill normal to all digits.  Calf is soft and supple, no posterior calf or knee pain, negative Homans' sign  Neurologic Normal speech. Oriented to person, place, and time. Epicritic sensation to light touch grossly present bilaterally.  Dermatologic Skin healing well without signs of infection. Skin edges well coapted without signs of infection.  Orthopedic: Tenderness to palpation noted about the surgical site.   Multiple view plain film radiographs: 05/22/2023 XR 3 views AP lateral oblique right ankle nonweightbearing.  Findings: Attention directed to the right ankle there is tibiotalar calcaneal nail present in the right ankle without evidence of failure or displacement.  Ankle remains in rectus alignment.  Good compression of the tibiotalar and subtalar joints. Assessment:   1. Status post ORIF of fracture of ankle    Plan:  Patient was evaluated and treated and all questions answered.  S/p ankle surgery right to include right ankle hardware removal with tibiotalar Arthrodesis with Nail. -Progressing as expected post-operatively. -XR: As above no acute postop complication  noted no change from postop x-rays in the hospital -WB Status: Nonweightbearing in cam boot -Sutures: To remain intact at this time. -Medications: Abx course post op prophylaxis completed -Foot redressed.  Leave dressing clean dry and intact until next appointment in 1 week         Corinna Gab, DPM [facility] / [facility]

## 2023-05-23 ENCOUNTER — Other Ambulatory Visit: Payer: Self-pay | Admitting: Podiatry

## 2023-05-23 ENCOUNTER — Telehealth (HOSPITAL_COMMUNITY): Payer: BC Managed Care – PPO | Admitting: Psychiatry

## 2023-05-23 ENCOUNTER — Encounter: Payer: Self-pay | Admitting: Podiatry

## 2023-05-23 MED ORDER — OXYCODONE-ACETAMINOPHEN 10-325 MG PO TABS: 1.0000 | ORAL_TABLET | ORAL | 0 refills | Status: DC | PRN

## 2023-05-23 NOTE — Progress Notes (Signed)
Percocet rx sent due to incomplete pain control on lesser dose.

## 2023-05-26 ENCOUNTER — Encounter: Payer: Self-pay | Admitting: Podiatry

## 2023-05-26 ENCOUNTER — Telehealth (HOSPITAL_COMMUNITY): Payer: BC Managed Care – PPO | Admitting: Psychiatry

## 2023-05-26 ENCOUNTER — Encounter (HOSPITAL_COMMUNITY): Payer: Self-pay

## 2023-05-27 ENCOUNTER — Emergency Department
Admission: EM | Admit: 2023-05-27 | Discharge: 2023-05-27 | Disposition: A | Discharge status: Home / Self Care | Payer: BC Managed Care – PPO | Attending: Emergency Medicine | Admitting: Emergency Medicine

## 2023-05-27 ENCOUNTER — Telehealth (HOSPITAL_COMMUNITY): Payer: BC Managed Care – PPO | Admitting: Psychiatry

## 2023-05-27 ENCOUNTER — Encounter (HOSPITAL_COMMUNITY): Payer: Self-pay

## 2023-05-27 ENCOUNTER — Other Ambulatory Visit: Payer: Self-pay

## 2023-05-27 ENCOUNTER — Emergency Department: Payer: BC Managed Care – PPO

## 2023-05-27 ENCOUNTER — Encounter: Payer: Self-pay | Admitting: *Deleted

## 2023-05-27 DIAGNOSIS — R42 Dizziness and giddiness: Secondary | ICD-10-CM | POA: Diagnosis not present

## 2023-05-27 DIAGNOSIS — I959 Hypotension, unspecified: Secondary | ICD-10-CM | POA: Insufficient documentation

## 2023-05-27 DIAGNOSIS — Z79899 Other long term (current) drug therapy: Secondary | ICD-10-CM

## 2023-05-27 LAB — CBC WITH DIFFERENTIAL/PLATELET
Abs Immature Granulocytes: 0.05 10*3/uL (ref 0.00–0.07)
Basophils Absolute: 0.1 10*3/uL (ref 0.0–0.1)
Basophils Relative: 1 %
Eosinophils Absolute: 0.4 10*3/uL (ref 0.0–0.5)
Eosinophils Relative: 5 %
HCT: 33.2 % — ABNORMAL LOW (ref 36.0–46.0)
Hemoglobin: 10.1 g/dL — ABNORMAL LOW (ref 12.0–15.0)
Immature Granulocytes: 1 %
Lymphocytes Relative: 47 %
Lymphs Abs: 3.1 10*3/uL (ref 0.7–4.0)
MCH: 29 pg (ref 26.0–34.0)
MCHC: 30.4 g/dL (ref 30.0–36.0)
MCV: 95.4 fL (ref 80.0–100.0)
Monocytes Absolute: 0.4 10*3/uL (ref 0.1–1.0)
Monocytes Relative: 6 %
Neutro Abs: 2.6 10*3/uL (ref 1.7–7.7)
Neutrophils Relative %: 40 %
Platelets: 265 10*3/uL (ref 150–400)
RBC: 3.48 MIL/uL — ABNORMAL LOW (ref 3.87–5.11)
RDW: 12.9 % (ref 11.5–15.5)
Smear Review: NORMAL
WBC: 6.6 10*3/uL (ref 4.0–10.5)
nRBC: 0 % (ref 0.0–0.2)

## 2023-05-27 LAB — COMPREHENSIVE METABOLIC PANEL
ALT: 16 U/L (ref 0–44)
AST: 14 U/L — ABNORMAL LOW (ref 15–41)
Albumin: 3.2 g/dL — ABNORMAL LOW (ref 3.5–5.0)
Alkaline Phosphatase: 88 U/L (ref 38–126)
Anion gap: 8 (ref 5–15)
BUN: 17 mg/dL (ref 6–20)
CO2: 26 mmol/L (ref 22–32)
Calcium: 8.7 mg/dL — ABNORMAL LOW (ref 8.9–10.3)
Chloride: 105 mmol/L (ref 98–111)
Creatinine, Ser: 1.03 mg/dL — ABNORMAL HIGH (ref 0.44–1.00)
GFR, Estimated: >60 mL/min (ref >60)
Glucose, Bld: 134 mg/dL — ABNORMAL HIGH (ref 70–99)
Potassium: 3.5 mmol/L (ref 3.5–5.1)
Sodium: 139 mmol/L (ref 135–145)
Total Bilirubin: 0.9 mg/dL (ref <1.2)
Total Protein: 5.7 g/dL — ABNORMAL LOW (ref 6.5–8.1)

## 2023-05-27 LAB — URINALYSIS, W/ REFLEX TO CULTURE (INFECTION SUSPECTED)
Bacteria, UA: NONE SEEN
Bilirubin Urine: NEGATIVE
Glucose, UA: >=500 mg/dL — AB
Hgb urine dipstick: NEGATIVE
Ketones, ur: NEGATIVE mg/dL
Leukocytes,Ua: NEGATIVE
Nitrite: NEGATIVE
Protein, ur: NEGATIVE mg/dL
Specific Gravity, Urine: 1.043 — ABNORMAL HIGH (ref 1.005–1.030)
pH: 5 (ref 5.0–8.0)

## 2023-05-27 LAB — URINE DRUG SCREEN, QUALITATIVE (ARMC ONLY)
Amphetamines, Ur Screen: NOT DETECTED
Barbiturates, Ur Screen: NOT DETECTED
Benzodiazepine, Ur Scrn: NOT DETECTED
Cannabinoid 50 Ng, Ur ~~LOC~~: NOT DETECTED
Cocaine Metabolite,Ur ~~LOC~~: NOT DETECTED
MDMA (Ecstasy)Ur Screen: NOT DETECTED
Methadone Scn, Ur: NOT DETECTED
Opiate, Ur Screen: POSITIVE — AB
Phencyclidine (PCP) Ur S: NOT DETECTED
Tricyclic, Ur Screen: POSITIVE — AB

## 2023-05-27 LAB — PROCALCITONIN: Procalcitonin: <0.1 ng/mL

## 2023-05-27 LAB — LACTIC ACID, PLASMA: Lactic Acid, Venous: 1.6 mmol/L (ref 0.5–1.9)

## 2023-05-27 MED ORDER — HYDROCORTISONE SOD SUC (PF) 100 MG IJ SOLR
100.0000 mg | Freq: Once | INTRAMUSCULAR | Status: AC
  Administered 2023-05-27: 100 mg via INTRAVENOUS
  Filled 2023-05-27: qty 2

## 2023-05-27 MED ORDER — SODIUM CHLORIDE 0.9 % IV BOLUS (SEPSIS)
1000.0000 mL | Freq: Once | INTRAVENOUS | Status: AC
Start: 2023-05-27 — End: 2023-05-27
  Administered 2023-05-27: 1000 mL via INTRAVENOUS

## 2023-05-27 MED ORDER — SODIUM CHLORIDE 0.9 % IV BOLUS
1000.0000 mL | Freq: Once | INTRAVENOUS | Status: AC
  Administered 2023-05-27: 1000 mL via INTRAVENOUS

## 2023-05-27 NOTE — ED Provider Notes (Signed)
[facility] Provider Note    Event Date/Time   First MD Initiated Contact with Patient 05/27/23 0210     (approximate)   History   Hypotension   HPI Andrea [name] is a 42 y.o. female who presents for evaluation of low blood pressure and lightheadedness.  She has had this happen before, most notably when she fell and broke her ankle about 3 weeks ago and was admitted to the hospital.  She has required multiple surgeries by podiatry.  She just was discharged home from rehab.  EMS was called out because she was hypotensive and feeling lightheaded, similar to her prior presentation.  She is awake and alert at this time.  She states she feels lightheaded but otherwise feels fine.  Her ankle always hurts but not acutely or different than usual.  She has been going to her follow-up appointments with podiatry.  She denies fever, chest pain, shortness of breath, nausea, vomiting, and abdominal pain.  She says she is taking her medications as prescribed and took a hydrocodone a few hours ago but has not been taking more than was ordered.     Physical Exam   Triage Vital Signs: ED Triage Vitals  Encounter Vitals Group     BP 05/27/23 0218 (!) 82/54     Systolic BP Percentile --      Diastolic BP Percentile --      Pulse Rate 05/27/23 0218 60     Resp 05/27/23 0218 18     Temp 05/27/23 0218 98.7 F (37.1 C)     Temp Source 05/27/23 0218 Oral     SpO2 05/27/23 0218 96 %     Weight 05/27/23 0215 118.4 kg (261 lb)     Height 05/27/23 0215 1.803 m (5\' 11" )     Head Circumference --      Peak Flow --      Pain Score 05/27/23 0214 3     Pain Loc --      Pain Education --      Exclude from Growth Chart --     Most recent vital signs: Vitals:   05/27/23 0645 05/27/23 0700  BP: 108/67 103/69  Pulse: 62 64  Resp:  20  Temp:    SpO2: 91% 93%    General: Awake, no distress.  CV:  Good peripheral perfusion.  Regular rate and rhythm. Resp:  Normal effort.  Speaking easily and comfortably, no accessory muscle usage nor intercostal retractions.  Lungs clear to auscultation. Abd:  Morbid obesity.  No tenderness to palpation throughout the abdomen. Other:  Remove the patient's wound dressings and podiatric boot from the right foot.  The wounds are well-appearing and seem to be healing well and appropriately.  No evidence of acute infectious process.   ED Results / Procedures / Treatments   Labs (all labs ordered are listed, but only abnormal results are displayed) Labs Reviewed  COMPREHENSIVE METABOLIC PANEL - Abnormal; Notable for the following components:      Result Value   Glucose, Bld 134 (*)    Creatinine, Ser 1.03 (*)    Calcium 8.7 (*)    Total Protein 5.7 (*)    Albumin 3.2 (*)    AST 14 (*)    All other components within normal limits  CBC WITH DIFFERENTIAL/PLATELET - Abnormal; Notable for the following components:   RBC 3.48 (*)    Hemoglobin 10.1 (*)    HCT 33.2 (*)    All  other components within normal limits  URINALYSIS, W/ REFLEX TO CULTURE (INFECTION SUSPECTED) - Abnormal; Notable for the following components:   Color, Urine YELLOW (*)    APPearance CLEAR (*)    Specific Gravity, Urine 1.043 (*)    Glucose, UA >=500 (*)    All other components within normal limits  URINE DRUG SCREEN, QUALITATIVE ([facility] ONLY) - Abnormal; Notable for the following components:   Tricyclic, Ur Screen POSITIVE (*)    Opiate, Ur Screen POSITIVE (*)    All other components within normal limits  CULTURE, BLOOD (SINGLE)  LACTIC ACID, PLASMA  PROCALCITONIN     EKG  ED ECG REPORT I, Loleta Rose, the attending physician, personally viewed and interpreted this ECG.  Date: 05/27/2023 EKG Time: 2:12 AM Rate: 60 Rhythm: normal sinus rhythm QRS Axis: normal Intervals: normal ST/T Wave abnormalities: normal Narrative Interpretation: no evidence of acute ischemia    RADIOLOGY I viewed and interpreted the patient's 1 view chest x-ray  and I see no evidence of pneumonia.  I also read the radiologist's report, which confirmed no acute findings.   PROCEDURES:  Critical Care performed: No  .1-3 Lead EKG Interpretation  Performed by: Loleta Rose, MD Authorized by: Loleta Rose, MD     Interpretation: normal     ECG rate:  60   ECG rate assessment: normal     Rhythm: sinus rhythm     Ectopy: none     Conduction: normal       IMPRESSION / MDM / ASSESSMENT AND PLAN / ED COURSE  I reviewed the triage vital signs and the nursing notes.                              Differential diagnosis includes, but is not limited to, polypharmacy, sepsis/septic shock, volume depletion, adrenal crisis.  Patient's presentation is most consistent with acute presentation with potential threat to life or bodily function.  Labs/studies ordered: 1 view chest x-ray, and EKG, CBC with differential, urinalysis, urine drug screen, procalcitonin, lactic acid, CMP, single blood culture  Interventions/Medications given:  Medications  sodium chloride 0.9 % bolus 1,000 mL (0 mLs Intravenous Stopped 05/27/23 0302)  sodium chloride 0.9 % bolus 1,000 mL (0 mLs Intravenous Stopped 05/27/23 0358)  hydrocortisone sodium succinate (SOLU-CORTEF) 100 MG injection 100 mg (100 mg Intravenous Given 05/27/23 0355)    (Note:  hospital course my include additional interventions and/or labs/studies not listed above.)   Patient is awake and alert and relatively well appearing despite the patient's hypotension.  I reviewed prior notes including the patient's ED note from 05/03/2023 and the subsequent hospital H&P and discharge summary.  There is no clear explanation for her initial hypotension but it is thought to be polypharmacy.  She claims that she is not taking any extra medications but is possible that the large number of medications and the combination of them is leading to hypotension.  I will evaluate for "possible sepsis" and provide 2 L of IV  fluid for volume repletion and reassess.  The patient is on the cardiac monitor to evaluate for evidence of arrhythmia and/or significant heart rate changes.   Clinical Course as of 05/27/23 0713  Tue May 27, 2023  1610 Procalcitonin: <0.10 Reassuring procalcitonin [CF]  0324 No leukocytosis, normal lactic acid.  Reassuring workup thus far. [CF]  0340 Particularly given a similar presentation in the past with no identifiable cause other than polypharmacy, I suspect that  is the reason for the patient's hypotension tonight.  I am ordering hydrocortisone 100 mg IV in case she is having an adrenal crisis leading to the unexplained hypotension. [CF]  [phone] Urinalysis, w/ Reflex to Culture (Infection Suspected) -Urine, Clean Catch(!) Negative UA [CF]  1191 Urine Drug Screen, Qualitative ([facility] only)(!) Patient has appropriate prescriptions to explain these positive findings, though of course the quantity being taken is unclear [CF]  0712 Patient has been stable throughout the night.  I reassessed her and her blood pressure has improved to 103/69.  She feels well and asymptomatic.  We talked about polypharmacy and no indication of an acute or emergent condition at this time.  I considered hospitalization but at this point I see no indication that she would benefit from it.  She is comfortable with the plan to stick with her prescribed medications as written and drink plenty of fluids and try to eat normally.  I gave my usual and customary follow-up recommendations and return precautions. [CF]    Clinical Course User Index [CF] Loleta Rose, MD     FINAL CLINICAL IMPRESSION(S) / ED DIAGNOSES   Final diagnoses:  Hypotension, unspecified hypotension type  Polypharmacy     Rx / DC Orders   ED Discharge Orders     None        Note:  This document was prepared using Dragon voice recognition software and may include unintentional dictation errors.   Loleta Rose, MD 05/27/23 [phone]

## 2023-05-27 NOTE — ED Notes (Signed)
Pt alert and talking on arrival to treatment room.  Iv in place.  Pt reports feeling weak and low blood pressure today.   Pt had surgery on right ankle recently.  Md at bedside.  Md removed bandage from right ankle.  Pt tolerated well.  2nd iv started, labs sent.

## 2023-05-27 NOTE — ED Notes (Signed)
EDP was at bedside. Plan is for pt to discharge.

## 2023-05-27 NOTE — ED Notes (Signed)
Iv fluids infusing.  Pt sleeping  easily aroused.  Family with pt.

## 2023-05-27 NOTE — Discharge Instructions (Signed)
Your workup in the Emergency Department today was reassuring.  We did not find any specific abnormalities.  As we discussed, we think it is likely that the combination of all the medications you are taking is resulting in your intermittent low blood pressure.  We recommend you drink plenty of fluids, take your regular medications and/or any new ones prescribed today, and follow up with the doctor(s) listed in these documents as recommended.  Return to the Emergency Department if you develop new or worsening symptoms that concern you.

## 2023-05-27 NOTE — ED Triage Notes (Signed)
Pt brought in via ems from home with weakness and hypotension.  Pt had surgery on right ankle 05/03/23.  Pt was released from [place] [facility] yesterday.  Pt has a boot and bandage in place.  Pt has iv in place on arrival.  Md at bedside.  Pt alert speech clear.

## 2023-05-27 NOTE — ED Notes (Signed)
Pt reports taking only 1 percocet at 2030 tonight.

## 2023-05-29 ENCOUNTER — Encounter: Payer: BC Managed Care – PPO | Admitting: Podiatry

## 2023-05-30 ENCOUNTER — Ambulatory Visit (INDEPENDENT_AMBULATORY_CARE_PROVIDER_SITE_OTHER): Payer: BC Managed Care – PPO | Admitting: Podiatry

## 2023-05-30 ENCOUNTER — Encounter: Payer: Self-pay | Admitting: Podiatry

## 2023-05-30 DIAGNOSIS — Z8781 Personal history of (healed) traumatic fracture: Secondary | ICD-10-CM

## 2023-05-30 DIAGNOSIS — Z9889 Other specified postprocedural states: Secondary | ICD-10-CM

## 2023-05-30 MED ORDER — OXYCODONE-ACETAMINOPHEN 10-325 MG PO TABS: 1.0000 | ORAL_TABLET | Freq: Four times a day (QID) | ORAL | 0 refills | Status: DC | PRN

## 2023-06-01 ENCOUNTER — Encounter: Payer: Self-pay | Admitting: Podiatry

## 2023-06-01 LAB — CULTURE, BLOOD (SINGLE)
Culture: NO GROWTH
Special Requests: ADEQUATE

## 2023-06-01 NOTE — Progress Notes (Signed)
  Subjective:  Patient ID: Andrea [name], female    DOB: [birth year],  MRN: [id number]  DOS: 05/13/2023 Procedure: Right ankle hardware removal and tibiotalar calcaneal arthrodesis Surgeon: Dr. Annamary Rummage  42 y.o. female returns for post-op check.  Patient returns for second postop check. Has been at a rehab facility.  Has maintain nonweightbearing status using a wheelchair at this time.  Dressings have remained intact since discharge from the hospital.   Review of Systems: Negative except as noted in the HPI. Denies N/V/F/Ch.   Objective:  There were no vitals filed for this visit. There is no height or weight on file to calculate BMI. Constitutional Well developed. Well nourished.  Vascular Foot warm and well perfused. Capillary refill normal to all digits.  Calf is soft and supple, no posterior calf or knee pain, negative Homans' sign  Neurologic Normal speech. Oriented to person, place, and time. Epicritic sensation to light touch grossly present bilaterally.  Dermatologic Skin healing well without signs of infection. Skin edges well coapted without signs of infection.  Orthopedic: Tenderness to palpation noted about the surgical site.    Multiple view plain film radiographs: 05/22/2023 XR 3 views AP lateral oblique right ankle nonweightbearing.  Findings: Attention directed to the right ankle there is tibiotalar calcaneal nail present in the right ankle without evidence of failure or displacement.  Ankle remains in rectus alignment.  Good compression of the tibiotalar and subtalar joints. Assessment:   1. Status post ORIF of fracture of ankle    Plan:  Patient was evaluated and treated and all questions answered.  S/p ankle surgery right to include right ankle hardware removal with tibiotalar Arthrodesis with Nail. -Progressing as expected post-operatively. -WB Status: Nonweightbearing in cam boot -Skin staples removed without incident today -Medications: Abx course post  op prophylaxis completed -Prescription for Percocet written for patient for postoperative pain -Foot redressed, advised patient to keep dressing applied over the next 2 to 3 days to protect incisions from excessive friction. She may begin to get these areas wet in the shower after this point, should gently dab these areas dry.  She should avoid submerging these areas for another 1 to 2 weeks. -Follow-up in 2 weeks with Dr. Annamary Rummage for postoperative check         Bronwen Betters, DPM [facility] / [facility]

## 2023-06-02 ENCOUNTER — Other Ambulatory Visit: Payer: Self-pay | Admitting: Podiatry

## 2023-06-02 DIAGNOSIS — Z8781 Personal history of (healed) traumatic fracture: Secondary | ICD-10-CM

## 2023-06-02 NOTE — Progress Notes (Signed)
PT order palced

## 2023-06-04 ENCOUNTER — Encounter: Payer: Self-pay | Admitting: Podiatry

## 2023-06-04 ENCOUNTER — Telehealth: Payer: Self-pay

## 2023-06-04 NOTE — Telephone Encounter (Signed)
Could you please call this patient and get her worked in with Dr. Annamary Rummage tomorrow? She is post op and her incision has opened. I cannot get anyone in [place] to Schedule her -  thank you so much!

## 2023-06-05 ENCOUNTER — Ambulatory Visit: Payer: BC Managed Care – PPO | Admitting: Podiatry

## 2023-06-05 ENCOUNTER — Encounter: Payer: Self-pay | Admitting: Podiatry

## 2023-06-05 DIAGNOSIS — Z9889 Other specified postprocedural states: Secondary | ICD-10-CM

## 2023-06-05 DIAGNOSIS — L03116 Cellulitis of left lower limb: Secondary | ICD-10-CM

## 2023-06-05 DIAGNOSIS — Z8781 Personal history of (healed) traumatic fracture: Secondary | ICD-10-CM

## 2023-06-05 MED ORDER — CIPROFLOXACIN HCL 500 MG PO TABS: 500.0000 mg | ORAL_TABLET | Freq: Two times a day (BID) | ORAL | 0 refills | Status: AC

## 2023-06-05 MED ORDER — DOXYCYCLINE HYCLATE 100 MG PO TABS: 100.0000 mg | ORAL_TABLET | Freq: Two times a day (BID) | ORAL | 0 refills | Status: AC

## 2023-06-05 NOTE — Progress Notes (Signed)
  Subjective:  Patient ID: Andrea [name], female    DOB: [birth year],  MRN: [id number]  DOS: 05/13/2023 Procedure: Right ankle hardware removal and tibiotalar calcaneal arthrodesis Surgeon: Dr. Annamary Rummage  42 y.o. female returns for post-op check.  She had some concern of some oozing from medial and lateral incision and reports seeing greenish drainage. Also requesting prescription for rolling knee scooter as she is home from rehab and needs to maintain nonweightbearing. Does report intermittent shooting pain up medial ankle.  Review of Systems: Negative except as noted in the HPI. Denies N/V/F/Ch.   Objective:  There were no vitals filed for this visit. There is no height or weight on file to calculate BMI. Constitutional Well developed. Well nourished.  Vascular Foot warm and well perfused. Capillary refill normal to all digits.  Calf is soft and supple, no posterior calf or knee pain, negative Homans' sign. Edema present within postoperative limits.  Neurologic Normal speech. Oriented to person, place, and time. Epicritic sensation to light touch grossly present bilaterally.  Dermatologic Surgical incisions appreciated.  Appear to be healing well.  No obvious signs of dehiscence.  Some stable scab present medially and laterally.  No drainage currently.  Very mild minor localized erythema over medial incision.  Orthopedic: Tenderness to palpation noted about the surgical site.    Radiographs: Deferred today. Assessment:   1. Status post ORIF of fracture of ankle   2. Cellulitis of left foot    Plan:  Patient was evaluated and treated and all questions answered.  S/p ankle surgery right to include right ankle hardware removal with tibiotalar Arthrodesis with Nail. -Does appear to be doing well -X-ray from incisions appreciated today.  Very mild erythema.  Starting antibiotics out of abundance of caution due to reported green drainage. Sending course of oral ciprofloxacin and  doxycycline -Prescription dispensed for knee scooter, this is necessary for the patient to maintain safe nonweightbearing of the surgical extremity. -To wear compression wrap applied to the right lower extremity with Telfa overlying the surgical incisions to assist with edema control. Advised patient to leave this intact for 2-3 days. Advised close montioring of the sites and use of compressive Ace wrap. -Patient currently taking 300 mg gabapentin the morning and afternoon and 600 mg at night managed by psychiatrist.  Advised patient to try doubling her morning dose for her symptoms of shooting sensations at this time.  -Follow-up with Dr. Annamary Rummage in 2 weeks for postop check, likely radiographs this time.         Bronwen Betters, DPM [facility] / [facility]

## 2023-06-08 ENCOUNTER — Encounter: Payer: Self-pay | Admitting: Podiatry

## 2023-06-09 ENCOUNTER — Other Ambulatory Visit (INDEPENDENT_AMBULATORY_CARE_PROVIDER_SITE_OTHER): Payer: BC Managed Care – PPO | Admitting: Podiatry

## 2023-06-09 ENCOUNTER — Encounter: Payer: Self-pay | Admitting: Podiatry

## 2023-06-09 MED ORDER — IBUPROFEN 800 MG PO TABS: 800.0000 mg | ORAL_TABLET | Freq: Three times a day (TID) | ORAL | 1 refills | Status: DC | PRN

## 2023-06-09 MED ORDER — APIXABAN 2.5 MG PO TABS: 2.5000 mg | ORAL_TABLET | Freq: Two times a day (BID) | ORAL | 0 refills | Status: DC

## 2023-06-09 NOTE — Progress Notes (Signed)
Rx for Barrett sent

## 2023-06-12 ENCOUNTER — Telehealth (HOSPITAL_COMMUNITY): Payer: Self-pay | Admitting: Psychiatry

## 2023-06-12 ENCOUNTER — Telehealth (HOSPITAL_COMMUNITY): Payer: BC Managed Care – PPO | Admitting: Psychiatry

## 2023-06-12 ENCOUNTER — Encounter (HOSPITAL_COMMUNITY): Payer: Self-pay

## 2023-06-12 NOTE — Telephone Encounter (Signed)
Patient calling from [place] where I do not have a license so will need to reschedule appointment to 06/20/23 at 830a. Reviewed no show charge due to calling from out of the state.

## 2023-06-16 ENCOUNTER — Encounter: Payer: Self-pay | Admitting: Podiatry

## 2023-06-17 ENCOUNTER — Ambulatory Visit: Payer: BC Managed Care – PPO | Attending: Podiatry

## 2023-06-17 ENCOUNTER — Encounter: Payer: BC Managed Care – PPO | Admitting: Podiatry

## 2023-06-17 DIAGNOSIS — M25571 Pain in right ankle and joints of right foot: Secondary | ICD-10-CM | POA: Insufficient documentation

## 2023-06-17 DIAGNOSIS — Z8781 Personal history of (healed) traumatic fracture: Secondary | ICD-10-CM | POA: Insufficient documentation

## 2023-06-17 DIAGNOSIS — R262 Difficulty in walking, not elsewhere classified: Secondary | ICD-10-CM | POA: Diagnosis present

## 2023-06-17 DIAGNOSIS — Z9889 Other specified postprocedural states: Secondary | ICD-10-CM | POA: Diagnosis not present

## 2023-06-17 NOTE — Therapy (Signed)
 OUTPATIENT PHYSICAL THERAPY LOWER EXTREMITY EVALUATION   Patient Name: Andrea [name] MRN: [id number] DOB:1980-06-23, 42 y.o., female Today's Date: 06/17/2023  END OF SESSION:  PT End of Session - 06/17/23 1121     Visit Number 1    Number of Visits 25    Date for PT Re-Evaluation 09/12/23    PT Start Time 1122    PT Stop Time 1202    PT Time Calculation (min) 40 min    Activity Tolerance Patient tolerated treatment well    Behavior During Therapy [facility] for tasks assessed/performed             Past Medical History:  Diagnosis Date   Anxiety    Bipolar 1 disorder (HCC)    Bipolar 1 disorder, mixed, severe (HCC) 12/20/2016   Bipolar I disorder, most recent episode depressed (HCC) 12/20/2016   Complication of anesthesia    hard time waking up    Depression    Diabetes mellitus without complication (HCC)    Drug induced akathisia 04/02/2022   GERD (gastroesophageal reflux disease)    no meds   Kidney stones    Long term current use of antipsychotic medication 03/13/2022   Low back pain    Migraines    PCOS (polycystic ovarian syndrome)    PONV (postoperative nausea and vomiting)    Schizophrenia (HCC)    Seizure (HCC) 10/03/2021   Seizures (HCC) 06/04/2016   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   Shortness of breath dyspnea    with bronchitis   Past Surgical History:  Procedure Laterality Date   ABDOMINAL HYSTERECTOMY N/A 11/14/2015   Procedure: HYSTERECTOMY ABDOMINAL;  Surgeon: Oneil FORBES Piety, MD;  Location: [facility];  Service: Gynecology;  Laterality: N/A;   ANKLE FUSION Right 05/13/2023   Procedure: ARTHRODESIS ANKLE;  Surgeon: Malvin Marsa FALCON, DPM;  Location: [facility];  Service: Orthopedics/Podiatry;  Laterality: Right;  Hardware removal, right ankle tibiot talo calcaneal arthrodesis   CHOLECYSTECTOMY     INTRAUTERINE DEVICE (IUD) INSERTION  06/14/2014   [facility]   LYMPH NODES REMOVED     ORIF ANKLE FRACTURE Right  05/03/2023   Procedure: OPEN REDUCTION INTERNAL FIXATION (ORIF) ANKLE FRACTURE;  Surgeon: Silva Juliene SAUNDERS, DPM;  Location: [facility];  Service: Orthopedics/Podiatry;  Laterality: Right;   ovarian cyst removed     SALPINGOOPHORECTOMY Bilateral 11/14/2015   Procedure: SALPINGO OOPHORECTOMY;  Surgeon: Oneil FORBES Piety, MD;  Location: [facility];  Service: Gynecology;  Laterality: Bilateral;   Patient Active Problem List   Diagnosis Date Noted   Obesity (BMI 30-39.9) 05/14/2023   Acquired varus deformity of right ankle 05/13/2023   Postoperative surgical complication involving musculoskeletal system associated with musculoskeletal procedure 05/12/2023   Closed right ankle fracture 05/03/2023   Bipolar 1 disorder (HCC) 05/03/2023   Type 2 diabetes mellitus with peripheral neuropathy (HCC) 05/03/2023   Essential hypertension 05/03/2023   Mild persistent asthma 05/03/2023   Closed trimalleolar fracture of right ankle 05/03/2023   Pre-syncope 05/03/2023   Chronic pain 07/16/2022   PTSD (post-traumatic stress disorder) 03/13/2022   Generalized anxiety disorder with panic attacks 03/13/2022   Moderate episode of recurrent major depressive disorder (HCC) 03/13/2022   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 03/13/2022   Other insomnia 03/13/2022   Long term current use of antipsychotic medication 03/13/2022   Polypharmacy 03/13/2022   Convulsion (HCC) 11/26/2021   Irregular periods 09/26/2020   Mild persistent asthma with acute exacerbation 06/18/2017   Uncontrolled  type 2 diabetes mellitus with hyperglycemia, with long-term current use of insulin  (HCC) 09/18/2016   HTN (hypertension) 09/18/2016   Pelvic pain in female 11/14/2015   Acute pyelonephritis 01/16/2013   Migraines 10/30/2012   Obesity, Class III, BMI 40-49.9 (morbid obesity) (HCC) 10/30/2012   Kidney stones     PCP: Leavy Waddell NOVAK, FNP   REFERRING PROVIDER: Malvin Marsa FALCON, DPM   REFERRING  DIAG: [phone] (ICD-10-CM) - Status post ORIF of fracture of ankle  THERAPY DIAG:  Pain in right ankle and joints of right foot - Plan: PT plan of care cert/re-cert  Difficulty in walking, not elsewhere classified - Plan: PT plan of care cert/re-cert  Rationale for Evaluation and Treatment: Rehabilitation  ONSET DATE: S/P 05/13/2023  SUBJECTIVE:   SUBJECTIVE STATEMENT: R ankle pain: 5/10 currently, 10/10 at most for the past 4 weeks.   PERTINENT HISTORY: S/P R ankle Tibiotalarcalcaneal arthrodesis on 05/13/2023. Pt is currently 5 weeks post op. Has not yet had PT for her procedure. This is a revision to her 05/03/2023 surgery. Pt went to get a box, got dizzy and fell, and broke her ankle. R ankle was crushing even more during her follow up appointment for her initial surgery leading to her current procedure. Pt currently non-weight bearing for 6-8 weeks.    Husband present  No latex allergies Blood pressure drops really low.  Last seizure was November 2023. Signs of seizure include feeling like she is going to black out.    PAIN:  Are you having pain? Yes: NPRS scale: 5/10 Pain location: R ankle Pain description: throbbing Aggravating factors: movement such as scooting around with the knee scooter Relieving factors: ice and heat.   PRECAUTIONS: Blood pressure drops really low.   RED FLAGS: Bowel or bladder incontinence: No and Cauda equina syndrome: No   WEIGHT BEARING RESTRICTIONS: Yes NWB for 6-8 weeks following surgery.   FALLS:  Has patient fallen in last 6 months? Yes. Number of falls 2  Low potassium caused the first fall, the low blood pressure caused to second fall  LIVING ENVIRONMENT: Lives with: lives with their spouse Lives in: House/apartment Stairs: Yes: External: 38 steps; on right going up, on left going up, and can reach both Has following equipment at home: Vannie - 2 wheeled and Crutches and a knee scooter  OCCUPATION: recruitment consultant, desk work. 45 minutes of sitting prior to standing break.   PLOF: Independent  PATIENT GOALS: Just want to be able to get on her feet again.   NEXT MD VISIT: June 24, 2023  OBJECTIVE:  Note: Objective measures were completed at Evaluation unless otherwise noted.  DIAGNOSTIC FINDINGS:   DG Ankle Complete Right 05/13/2023  Narrative & Impression  CLINICAL DATA:  Postop.   EXAM: RIGHT TIBIA AND FIBULA - 2 VIEW; RIGHT ANKLE - COMPLETE 3+ VIEW; RIGHT OS CALCIS - 2+ VIEW   COMPARISON:  Preoperative imaging.   FINDINGS: Interval distal fibular resection. Intramedullary nail with proximal and distal locking screw traversing the tibia, talus, and calcaneus. Interval medial tibial resection. Posterior splint material in place. Multifocal skin staples.   IMPRESSION: Interval distal fibular resection and medial tibial resection. Ankle arthrodesis with intramedullary nail with traversing the tibia, talus, and calcaneus.     Electronically Signed   By: Andrea Gasman M.D.   On: 05/13/2023 17:41   PATIENT SURVEYS:  FOTO R ankle FOTO 11 (06/17/2023)  COGNITION: Overall cognitive status: Within functional limits for tasks assessed  SENSATION: Not tested  EDEMA:  Pt R ankle wrapped for swelling, unable to assess surgical incisions. Pt pt and husband, pt has an infection at her R lateral ankle incision and was given antibiotics about 1-2 weeks ago.      LOWER EXTREMITY MMT:  MMT Right eval Left eval  Hip flexion 4 4  Hip extension 3+ 4-  Hip abduction 4 4  Hip adduction    Hip internal rotation    Hip external rotation    Knee flexion 5 4+  Knee extension 5 5  Ankle dorsiflexion    Ankle plantarflexion    Ankle inversion    Ankle eversion     (Blank rows = not tested)  LOWER EXTREMITY SPECIAL TESTS:    FUNCTIONAL TESTS:    GAIT: Distance walked: n/a Assistive device utilized:  Knee scooter for R LE Level of assistance: Modified  independence Comments: NWB R LE with CAM boot                                                                                                                                TREATMENT DATE: 06/17/2023    Blood pressure L arm sitting, normal cuff, mechanically taken: 140/88, HR 84  Therapeutic Exercise Supine toe flexion and extension 10x3  Supine SLR hip flexion R 10x  S/L hip abduction   R 5x3  L 5x3  Cues to maintain NWB when performing sit to stand and stand pivot transfers secondary to pt observed to place weight onto R LE with CAM boot.   Improved exercise technique, movement at target joints, use of target muscles after mod verbal, visual, tactile cues.        PATIENT EDUCATION:  Education details: there-ex, HEP, POC Person educated: Patient and Spouse Education method: Explanation, Demonstration, Tactile cues, Verbal cues, and Handouts Education comprehension: verbalized understanding and returned demonstration  HOME EXERCISE PROGRAM: Access Code: [email or web address] URL: [email or web address] Date: 06/17/2023 Prepared by: Emil Glassman  Exercises - Sidelying Hip Abduction  - 1 x daily - 7 x weekly - 3 sets - 5 reps  ASSESSMENT:  CLINICAL IMPRESSION: Patient is a 42 y.o. female who was seen today for physical therapy evaluation and treatment for S/P R ankle tibiotalarcalcaneal arthrodesis on 05/13/2023. She is currently non-weight bearing with CAM boot. She also presents with difficulty walking, needing a knee scooter for mobility secondary to NWB at R ankle, as well as bilateral hip weakness, and difficulty performing weight bearing tasks and transfers. Pt will benefit from skilled physical therapy services to address the aforementioned deficits.     OBJECTIVE IMPAIRMENTS: Abnormal gait, decreased balance, decreased endurance, decreased mobility, difficulty walking, decreased ROM, decreased strength, improper body mechanics, postural dysfunction, and pain.    ACTIVITY LIMITATIONS: carrying, lifting, standing, squatting, stairs, transfers, and locomotion level  PARTICIPATION LIMITATIONS:   PERSONAL FACTORS: Fitness and 3+ comorbidities: anxiety, bipolar disorder, DM, LBP, schizophrenia, seizures  are also affecting patient's functional outcome.  REHAB POTENTIAL: Fair    CLINICAL DECISION MAKING: Stable/uncomplicated  EVALUATION COMPLEXITY: Low   GOALS: Goals reviewed with patient? Yes  SHORT TERM GOALS: Target date: 06/26/2022 Pt will be independent with her initial HEP to improve strength, function, and ability to ambulate with less difficulty.  Baseline: Pt has started her initial HEP (06/17/2023) Goal status: INITIAL    LONG TERM GOALS: Target date: 09/12/2023  Pt will improve her R ankle FOTO score by at least 10 points as a demonstration of improved function.  Baseline: R ankle FOTO 11 (06/17/2023) Goal status: INITIAL  2.  Pt will be able to ambulate at least 500 ft without CAM boot and without AD and no LOB to promote mobility.  Baseline: Pt currently wearing CAM boot and NWB R ankle, uses a knee scooter (06/17/2023) Goal status: INITIAL  3.  Pt will improve B hip abduction and extension strength by at least 1 MMT grade to promote ability to ambulate and perform standing tasks with less difficulty.  Baseline:  MMT Right eval Left eval  Hip extension 3+ 4-  Hip abduction 4 4   (06/17/2023)  Goal status: INITIAL    PLAN:  PT FREQUENCY: 2x/week  PT DURATION: 12 weeks  PLANNED INTERVENTIONS: 97110-Therapeutic exercises, 97530- Therapeutic activity, [id number]- Neuromuscular re-education, 97140- Manual therapy, [id number]- Gait training, [id number]- Electrical stimulation (unattended), [phone]- Ionotophoresis 4mg /ml Dexamethasone , Patient/Family education, Balance training, and Stair training  PLAN FOR NEXT SESSION: hip and trunk strengthening, maintaining NWB until otherwise noted by MD, manual techniques, modalities  PRN  Raegyn Renda PT, DPT  06/17/2023, 12:46 PM

## 2023-06-19 ENCOUNTER — Other Ambulatory Visit (HOSPITAL_COMMUNITY): Payer: Self-pay | Admitting: Psychiatry

## 2023-06-19 ENCOUNTER — Encounter: Payer: BC Managed Care – PPO | Admitting: Podiatry

## 2023-06-19 ENCOUNTER — Other Ambulatory Visit: Payer: Self-pay | Admitting: Podiatry

## 2023-06-19 ENCOUNTER — Telehealth: Payer: Self-pay

## 2023-06-19 ENCOUNTER — Ambulatory Visit: Payer: 59

## 2023-06-19 DIAGNOSIS — Z8781 Personal history of (healed) traumatic fracture: Secondary | ICD-10-CM

## 2023-06-19 DIAGNOSIS — G4709 Other insomnia: Secondary | ICD-10-CM

## 2023-06-19 DIAGNOSIS — F4310 Post-traumatic stress disorder, unspecified: Secondary | ICD-10-CM

## 2023-06-19 NOTE — Telephone Encounter (Signed)
 Patient called and left a message. She has been taking Motrin as suggested, but it is not helping with the sharp pain - she is asking for something stronger

## 2023-06-20 ENCOUNTER — Telehealth (INDEPENDENT_AMBULATORY_CARE_PROVIDER_SITE_OTHER): Payer: 59 | Admitting: Psychiatry

## 2023-06-20 ENCOUNTER — Other Ambulatory Visit: Payer: Self-pay | Admitting: Podiatry

## 2023-06-20 ENCOUNTER — Encounter (HOSPITAL_COMMUNITY): Payer: Self-pay | Admitting: Psychiatry

## 2023-06-20 DIAGNOSIS — F331 Major depressive disorder, recurrent, moderate: Secondary | ICD-10-CM | POA: Diagnosis not present

## 2023-06-20 DIAGNOSIS — G4709 Other insomnia: Secondary | ICD-10-CM

## 2023-06-20 DIAGNOSIS — F445 Conversion disorder with seizures or convulsions: Secondary | ICD-10-CM

## 2023-06-20 DIAGNOSIS — F4310 Post-traumatic stress disorder, unspecified: Secondary | ICD-10-CM | POA: Diagnosis not present

## 2023-06-20 DIAGNOSIS — Z79899 Other long term (current) drug therapy: Secondary | ICD-10-CM

## 2023-06-20 DIAGNOSIS — Z8781 Personal history of (healed) traumatic fracture: Secondary | ICD-10-CM

## 2023-06-20 DIAGNOSIS — F411 Generalized anxiety disorder: Secondary | ICD-10-CM | POA: Diagnosis not present

## 2023-06-20 DIAGNOSIS — G894 Chronic pain syndrome: Secondary | ICD-10-CM

## 2023-06-20 DIAGNOSIS — I951 Orthostatic hypotension: Secondary | ICD-10-CM

## 2023-06-20 DIAGNOSIS — F410 Panic disorder [episodic paroxysmal anxiety] without agoraphobia: Secondary | ICD-10-CM

## 2023-06-20 MED ORDER — PRAZOSIN HCL 1 MG PO CAPS: ORAL_CAPSULE | ORAL | 0 refills | Status: DC

## 2023-06-20 MED ORDER — DULOXETINE HCL 60 MG PO CPEP: 120.0000 mg | ORAL_CAPSULE | Freq: Every day | ORAL | 0 refills | Status: DC

## 2023-06-20 MED ORDER — OXYCODONE-ACETAMINOPHEN 10-325 MG PO TABS: 1.0000 | ORAL_TABLET | Freq: Four times a day (QID) | ORAL | 0 refills | Status: DC | PRN
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
## 2023-06-20 NOTE — Patient Instructions (Signed)
 We decreased the prazosin  to 3 mg nightly for the next week and will then plan on decreasing to 2 mg nightly for the next week after that and then we will decrease to 1 mg nightly for the week after that.  Then discontinue.  This should give you about a week off of the prazosin  before you see me next and will give us  a better idea if this was contributing to your low blood pressure.  Please contact your primary care provider about the continued fainting spells as this is something they would likely want to monitor more closely.

## 2023-06-23 ENCOUNTER — Ambulatory Visit: Payer: 59

## 2023-06-24 ENCOUNTER — Ambulatory Visit (INDEPENDENT_AMBULATORY_CARE_PROVIDER_SITE_OTHER): Payer: Self-pay

## 2023-06-24 ENCOUNTER — Ambulatory Visit (INDEPENDENT_AMBULATORY_CARE_PROVIDER_SITE_OTHER): Payer: Self-pay | Admitting: Podiatry

## 2023-06-24 DIAGNOSIS — Z8781 Personal history of (healed) traumatic fracture: Secondary | ICD-10-CM

## 2023-06-24 DIAGNOSIS — Z9889 Other specified postprocedural states: Secondary | ICD-10-CM

## 2023-06-24 NOTE — Progress Notes (Signed)
  Subjective:  Patient ID: Andrea [name], female    DOB: 02-08-1981,  MRN: [id number]  DOS: 05/13/2023 Procedure: Right ankle hardware removal and tibiotalar calcaneal arthrodesis  43 y.o. female returns for post-op check.   Patient is now approximately 1 and half months out from right ankle TTC arthrodesis.  She reports she is doing better pain is decreased.  Nonweightbearing in a cam boot using a knee scooter to get around.  Back at home.  Taking some Percocet as needed for pain  Review of Systems: Negative except as noted in the HPI. Denies N/V/F/Ch.   Objective:  There were no vitals filed for this visit. There is no height or weight on file to calculate BMI. Constitutional Well developed. Well nourished.  Vascular Foot warm and well perfused. Capillary refill normal to all digits.  Calf is soft and supple, no posterior calf or knee pain, negative Homans' sign. Edema present within postoperative limits.  Neurologic Normal speech. Oriented to person, place, and time. Epicritic sensation to light touch grossly present bilaterally.  Dermatologic Surgical incisions healed well no evidence of dehiscence erythema drainage or other signs of infection  Orthopedic: Decreased edema and decreased tenderness to palpation noted about the surgical site.    Radiographs: 06/24/2023 XR 2 views ankle and 3 views foot of the right with maintained alignment of the tibiotalar and subtalar joints.  Hindfoot nail intact without evidence of backing out or loss of compression at the ankle and subtalar joint.  Osseous healing noted about the tibiotalar joint. Assessment:   1. Status post ORIF of fracture of ankle    Plan:  Patient was evaluated and treated and all questions answered.  S/p ankle surgery right to include right ankle hardware removal with tibiotalar Arthrodesis with Nail. -Progressing as expected postop.  X-rays without evidence of hardware failure or breakage or malposition - Improving  postop with decreased edema and pain - Patient has knee scooter using it for mobility purposes to maintain nonweightbearing status - Maintain nonweightbearing status on the right lower extremity for another month - Continue gabapentin  as well as Percocet as needed for pain - Follow-up in 1 month for recheck with additional x-rays until then continue nonweightbearing and compression therapy of the right lower extremity        Marolyn JULIANNA Honour, DPM [facility] / [facility]                   06/24/2023

## 2023-06-26 ENCOUNTER — Ambulatory Visit: Payer: 59

## 2023-06-26 ENCOUNTER — Encounter: Payer: Self-pay | Admitting: Nurse Practitioner

## 2023-06-26 ENCOUNTER — Ambulatory Visit (INDEPENDENT_AMBULATORY_CARE_PROVIDER_SITE_OTHER): Payer: 59 | Admitting: Nurse Practitioner

## 2023-06-26 VITALS — BP 114/64 | HR 71 | Temp 98.1°F | Ht 71.0 in | Wt 281.4 lb

## 2023-06-26 DIAGNOSIS — K5900 Constipation, unspecified: Secondary | ICD-10-CM

## 2023-06-26 DIAGNOSIS — F331 Major depressive disorder, recurrent, moderate: Secondary | ICD-10-CM | POA: Diagnosis not present

## 2023-06-26 DIAGNOSIS — Z7984 Long term (current) use of oral hypoglycemic drugs: Secondary | ICD-10-CM | POA: Diagnosis not present

## 2023-06-26 DIAGNOSIS — J4530 Mild persistent asthma, uncomplicated: Secondary | ICD-10-CM | POA: Diagnosis not present

## 2023-06-26 DIAGNOSIS — I959 Hypotension, unspecified: Secondary | ICD-10-CM

## 2023-06-26 DIAGNOSIS — G43009 Migraine without aura, not intractable, without status migrainosus: Secondary | ICD-10-CM

## 2023-06-26 DIAGNOSIS — E559 Vitamin D deficiency, unspecified: Secondary | ICD-10-CM | POA: Diagnosis not present

## 2023-06-26 DIAGNOSIS — E119 Type 2 diabetes mellitus without complications: Secondary | ICD-10-CM

## 2023-06-26 DIAGNOSIS — I10 Essential (primary) hypertension: Secondary | ICD-10-CM

## 2023-06-26 DIAGNOSIS — R251 Tremor, unspecified: Secondary | ICD-10-CM

## 2023-06-26 LAB — CBC WITH DIFFERENTIAL/PLATELET
Basophils Absolute: 0.1 10*3/uL (ref 0.0–0.1)
Basophils Relative: 0.8 % (ref 0.0–3.0)
Eosinophils Absolute: 0.4 10*3/uL (ref 0.0–0.7)
Eosinophils Relative: 4.8 % (ref 0.0–5.0)
HCT: 37.7 % (ref 36.0–46.0)
Hemoglobin: 11.9 g/dL — ABNORMAL LOW (ref 12.0–15.0)
Lymphocytes Relative: 28.8 % (ref 12.0–46.0)
Lymphs Abs: 2.2 10*3/uL (ref 0.7–4.0)
MCHC: 31.7 g/dL (ref 30.0–36.0)
MCV: 89.7 fL (ref 78.0–100.0)
Monocytes Absolute: 0.4 10*3/uL (ref 0.1–1.0)
Monocytes Relative: 5.3 % (ref 3.0–12.0)
Neutro Abs: 4.5 10*3/uL (ref 1.4–7.7)
Neutrophils Relative %: 60.3 % (ref 43.0–77.0)
Platelets: 273 10*3/uL (ref 150.0–400.0)
RBC: 4.2 Mil/uL (ref 3.87–5.11)
RDW: 14.5 % (ref 11.5–15.5)
WBC: 7.5 10*3/uL (ref 4.0–10.5)

## 2023-06-26 LAB — LIPID PANEL
Cholesterol: 238 mg/dL — ABNORMAL HIGH (ref 0–200)
HDL: 46.7 mg/dL (ref >39.00)
LDL Cholesterol: 149 mg/dL — ABNORMAL HIGH (ref 0–99)
NonHDL: 191.17
Total CHOL/HDL Ratio: 5
Triglycerides: 209 mg/dL — ABNORMAL HIGH (ref 0.0–149.0)
VLDL: 41.8 mg/dL — ABNORMAL HIGH (ref 0.0–40.0)

## 2023-06-26 LAB — MICROALBUMIN / CREATININE URINE RATIO
Creatinine,U: 83.3 mg/dL
Microalb Creat Ratio: 0.8 mg/g (ref 0.0–30.0)
Microalb, Ur: <0.7 mg/dL (ref 0.0–1.9)

## 2023-06-26 LAB — COMPREHENSIVE METABOLIC PANEL
ALT: 12 U/L (ref 0–35)
AST: 12 U/L (ref 0–37)
Albumin: 4.3 g/dL (ref 3.5–5.2)
Alkaline Phosphatase: 110 U/L (ref 39–117)
BUN: 23 mg/dL (ref 6–23)
CO2: 28 meq/L (ref 19–32)
Calcium: 9.6 mg/dL (ref 8.4–10.5)
Chloride: 104 meq/L (ref 96–112)
Creatinine, Ser: 0.81 mg/dL (ref 0.40–1.20)
GFR: 89.28 mL/min (ref >60.00)
Glucose, Bld: 140 mg/dL — ABNORMAL HIGH (ref 70–99)
Potassium: 4.2 meq/L (ref 3.5–5.1)
Sodium: 141 meq/L (ref 135–145)
Total Bilirubin: 0.3 mg/dL (ref 0.2–1.2)
Total Protein: 6.9 g/dL (ref 6.0–8.3)

## 2023-06-26 LAB — B12 AND FOLATE PANEL
Folate: 8.8 ng/mL (ref >5.9)
Vitamin B-12: 212 pg/mL (ref 211–911)

## 2023-06-26 NOTE — Patient Instructions (Signed)
 Please monitor your blood pressure 2-3 times a week at the same time and send via MyChart. Please monitor blood pressure daily twice a day and send [place] the readings via MyChart.  Please go to the lab for blood work

## 2023-06-26 NOTE — Progress Notes (Signed)
 New Patient Office Visit  Subjective   Patient ID: Andrea [name], female    DOB: 06/07/1981  Age: 43 y.o. MRN: [id number]  CC:  Chief Complaint  Patient presents with   Establish Care    Low BP, Protein, Iron & Potassium  Right ankle surgeries 05/03/23 & 05/12/23   Discussed the use of a AI scribe  software for clinical note transcription with the patient, who gave verbal consent to proceed.  HPI Andrea [name] presents to establish care accompanied with her boyfriend. Her previous PCP was Ms. Waddell, FNP.  She has h/o migarine, depression, diabetes, hypertension,insomnia, psychogenic nonelectrical seizures and  asthma.   She presents with a new onset of hand tremors, particularly in the mornings. These tremors, described as 'jerky,' persist for a couple of hours and significantly impair the patient's ability to hold objects, such as a cup. The onset of these symptoms was noted after recent ankle surgery in November.  She is currently on Eliquis , prescribed by her podiatrist, to prevent blood clots in her legs due to non-weight bearing status post ankle surgery.   Migarine: She takes Qulipta  for migraines. The migraine have been particularly severe in the past few days, possibly due to stress from being out of work and financial strain. She is followed by neurology Dr. Georjean.  Anxiety/depression: are managed with Cymbalta  and Seroquel . She also takes trazodone , and was tapering off Minipress  due to concerns of low blood pressure.  She is followed by psychiatry  Dr. Jayson  Diabetes: She is on Rybelsus . Lab Results  Component Value Date   HGBA1C 6.4 (H) 05/03/2023   Hypotension:She also has a history of hypertension managed with lisinopril  and spironolactone , the latter was discontinued due to concerns of low blood pressure  She reports episodes of low blood pressure, particularly at night, with readings as low as 85/40 and 60/40, leading to ER visits for fluid  administration.  Asthma: Stable on inhaler and  montelukast   The patient has been experiencing constipation, which has worsened since her ankle surgery. She has been using a stool softener to manage this.    Health Maintenance  Topic Date Due   COVID-19 Vaccine (1) Never done   Pneumococcal Vaccine 26-33 Years old (1 of 2 - PCV) Never done   FOOT EXAM  Never done   OPHTHALMOLOGY EXAM  Never done   Hepatitis C Screening  Never done   DTaP/Tdap/Td (2 - Tdap) 02/16/2015   Cervical Cancer Screening (HPV/Pap Cotest)  03/27/2015   HEMOGLOBIN A1C  10/31/2023   Diabetic kidney evaluation - eGFR measurement  06/25/2024   Diabetic kidney evaluation - Urine ACR  06/25/2024   INFLUENZA VACCINE  Completed   HIV Screening  Completed   HPV VACCINES  Aged Out    There are no preventive care reminders to display for this patient.  Outpatient Encounter Medications as of 06/26/2023  Medication Sig   albuterol  (PROVENTIL ) (2.5 MG/3ML) 0.083% nebulizer solution Take 2.5 mg by nebulization every 4 (four) hours as needed.   Albuterol -Budesonide  (AIRSUPRA) 90-80 MCG/ACT AERO    Atogepant  (QULIPTA ) 30 MG TABS Take 1 tablet daily   calcium carbonate (OSCAL) 1500 (600 Ca) MG TABS tablet Take 1,500 mg by mouth 2 (two) times daily with a meal.   DULoxetine  (CYMBALTA ) 60 MG capsule Take 2 capsules (120 mg total) by mouth daily.   famotidine  (PEPCID ) 40 MG tablet Take 40 mg by mouth 2 (two) times daily.   gabapentin  (NEURONTIN ) 300 MG  capsule Take 1 pill each morning, one at noon, and 2 at night.   Galcanezumab -gnlm (EMGALITY ) 120 MG/ML SOAJ Inject 1 Pen into the skin every 30 (thirty) days.   GEMTESA 75 MG TABS Take 1 tablet by mouth daily.   ibuprofen  (ADVIL ) 800 MG tablet Take 1 tablet (800 mg total) by mouth every 8 (eight) hours as needed.   levocetirizine (XYZAL ) 5 MG tablet Take 5 mg by mouth at bedtime.   pantoprazole  (PROTONIX ) 40 MG tablet Take 40 mg by mouth every morning.   potassium chloride   (MICRO-K ) 10 MEQ CR capsule Take 10 mEq by mouth daily.   prazosin  (MINIPRESS ) 1 MG capsule Take 3 pills nightly for 1 week.  Then take 2 pills nightly for 1 week.  Then take 1 pill nightly for 1 week and discontinue.   QUEtiapine  (SEROQUEL ) 50 MG tablet TAKE ONE TABLET IN THE MORNING, ONE AT MIDDAY, 2 AT NIGHT.   RYBELSUS  14 MG TABS Take 1 tablet by mouth daily.   SINGULAIR  10 MG tablet Take 10 mg by mouth daily.   SYMBICORT 160-4.5 MCG/ACT inhaler Inhale 2 puffs into the lungs.   traZODone  (DESYREL ) 150 MG tablet Take 150 mg by mouth at bedtime.   Vitamin D , Ergocalciferol , (DRISDOL ) 1.25 MG (50000 UNIT) CAPS capsule Take 1 capsule (50,000 Units total) by mouth every 7 (seven) days.   zonisamide  (ZONEGRAN ) 100 MG capsule Take 5 capsules every night   [DISCONTINUED] apixaban  (ELIQUIS ) 2.5 MG TABS tablet Take 1 tablet (2.5 mg total) by mouth 2 (two) times daily.   No facility-administered encounter medications on file as of 06/26/2023.    Past Medical History:  Diagnosis Date   Anxiety    Asthma    Bipolar 1 disorder (HCC)    Bipolar 1 disorder, mixed, severe (HCC) 12/20/2016   Bipolar I disorder, most recent episode depressed (HCC) 12/20/2016   Complication of anesthesia    hard time waking up    Depression    Diabetes mellitus without complication (HCC)    Drug induced akathisia 04/02/2022   GERD (gastroesophageal reflux disease)    no meds   Hypertension    Kidney stones    Long term current use of antipsychotic medication 03/13/2022   Low back pain    Migraines    PCOS (polycystic ovarian syndrome)    PONV (postoperative nausea and vomiting)    Schizophrenia (HCC)    Seizure (HCC) 10/03/2021   Seizures (HCC) 06/04/2016   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   Shortness of breath dyspnea    with bronchitis    Past Surgical History:  Procedure Laterality Date   ABDOMINAL HYSTERECTOMY N/A 11/14/2015   Procedure: HYSTERECTOMY ABDOMINAL;  Surgeon: Oneil FORBES Piety, MD;  Location: [facility];  Service: Gynecology;  Laterality: N/A;   ANKLE FUSION Right 05/13/2023   Procedure: ARTHRODESIS ANKLE;  Surgeon: Malvin Marsa FALCON, DPM;  Location: [facility];  Service: Orthopedics/Podiatry;  Laterality: Right;  Hardware removal, right ankle tibiot talo calcaneal arthrodesis   CHOLECYSTECTOMY     INTRAUTERINE DEVICE (IUD) INSERTION  06/14/2014   [facility]   LYMPH NODES REMOVED     ORIF ANKLE FRACTURE Right 05/03/2023   Procedure: OPEN REDUCTION INTERNAL FIXATION (ORIF) ANKLE FRACTURE;  Surgeon: Silva Juliene SAUNDERS, DPM;  Location: [facility];  Service: Orthopedics/Podiatry;  Laterality: Right;   ovarian cyst removed     SALPINGOOPHORECTOMY Bilateral 11/14/2015   Procedure: SALPINGO OOPHORECTOMY;  Surgeon: Oneil FORBES Piety, MD;  Location:  [facility];  Service: Gynecology;  Laterality: Bilateral;    Family History  Problem Relation Age of Onset   Miscarriages / Stillbirths Mother    Hypertension Mother    Heart disease Mother    Hypertension Father    Healthy Father    Healthy Sister    Healthy Brother     Social History   Socioeconomic History   Marital status: Divorced    Spouse name: Not on file   Number of children: 0   Years of education: 12   Highest education level: Some college, no degree  Occupational History   Not on file  Tobacco Use   Smoking status: Never   Smokeless tobacco: Never  Vaping Use   Vaping status: Never Used  Substance and Sexual Activity   Alcohol  use: Never   Drug use: Never   Sexual activity: Yes    Partners: Male  Other Topics Concern   Not on file  Social History Narrative   Right handed   Drinks caffeine   One story home   Social Drivers of Health   Financial Resource Strain: Medium Risk (06/23/2023)   Overall Financial Resource Strain (CARDIA)    Difficulty of Paying Living Expenses: Somewhat hard  Food Insecurity: Food Insecurity Present (06/23/2023)   Hunger Vital Sign    Worried About  Running Out of Food in the Last Year: Sometimes true    Ran Out of Food in the Last Year: Sometimes true  Transportation Needs: No Transportation Needs (06/23/2023)   PRAPARE - Administrator, Civil Service (Medical): No    Lack of Transportation (Non-Medical): No  Physical Activity: Unknown (06/23/2023)   Exercise Vital Sign    Days of Exercise per Week: 0 days    Minutes of Exercise per Session: Not on file  Stress: Stress Concern Present (06/23/2023)   [employer] of Occupational Health - Occupational Stress Questionnaire    Feeling of Stress : Very much  Social Connections: Socially Integrated (06/23/2023)   Social Connection and Isolation Panel [NHANES]    Frequency of Communication with Friends and Family: More than three times a week    Frequency of Social Gatherings with Friends and Family: Once a week    Attends Religious Services: More than 4 times per year    Active Member of [employer] or Organizations: Yes    Attends Banker Meetings: More than 4 times per year    Marital Status: Living with partner  Intimate Partner Violence: Not At Risk (05/12/2023)   Humiliation, Afraid, Rape, and Kick questionnaire    Fear of Current or Ex-Partner: No    Emotionally Abused: No    Physically Abused: No    Sexually Abused: No    ROS Negative unless indicated in HPI.      Objective    BP 114/64   Pulse 71   Temp 98.1 F (36.7 C)   Ht 5' 11 (1.803 m)   Wt 281 lb 6.4 oz (127.6 kg)   LMP  (LMP Unknown)   SpO2 96%   BMI 39.25 kg/m   Physical Exam Constitutional:      Appearance: Normal appearance.  HENT:     Right Ear: Tympanic membrane normal.     Left Ear: Tympanic membrane normal.     Mouth/Throat:     Mouth: Mucous membranes are moist.  Eyes:     Conjunctiva/sclera: Conjunctivae normal.     Pupils: Pupils are equal, round, and reactive to light.  Cardiovascular:     Rate and Rhythm: Normal rate and regular rhythm.     Pulses: Normal pulses.      Heart sounds: Normal heart sounds.  Pulmonary:     Effort: Pulmonary effort is normal.     Breath sounds: Normal breath sounds.  Abdominal:     General: Bowel sounds are normal.     Palpations: Abdomen is soft.  Musculoskeletal:     Cervical back: Normal range of motion. No tenderness.     Comments: R Ankle surgery  Skin:    General: Skin is warm.     Findings: No bruising.  Neurological:     General: No focal deficit present.     Mental Status: She is alert and oriented to person, place, and time. Mental status is at baseline.  Psychiatric:        Mood and Affect: Mood normal.        Behavior: Behavior normal.        Thought Content: Thought content normal.        Judgment: Judgment normal.         Assessment & Plan:  Migraine without aura and without status migrainosus, not intractable Assessment & Plan: Recent increase in frequency, possibly related to stress. Currently managed with Qulipta .  Followed by neurology -Continue current migraine management.    Moderate episode of recurrent major depressive disorder [facility]) Assessment & Plan: Continue current medication regimen.  Followed by psychiatry.   Primary hypertension Assessment & Plan: Check blood pressure consistently for two weeks, twice daily. Report blood pressure readings via MyChart in two weeks.  Orders: -     Lipid panel -     CBC with Differential/Platelet -     Comprehensive metabolic panel  Mild persistent asthma, unspecified whether complicated  Vitamin D  deficiency -     B12 and Folate Panel  Diabetes mellitus treated with oral medication [facility]) Assessment & Plan: Lab Results  Component Value Date   HGBA1C 6.4 (H) 05/03/2023  Continue Rybelsus .   Orders: -     Microalbumin / creatinine urine ratio  Hypotension, unspecified hypotension type Assessment & Plan: Recent episodes of low blood pressure with symptoms of dizziness and lightheadedness. No clear triggers identified.  Minipress  was recently discontinued due to concerns of contributing to hypotension. -Check blood pressure consistently for two weeks -Report blood pressure readings via MyChart in two weeks. -Consider referral to nephrology if hypotension persists.   Occasional tremors Assessment & Plan: New onset of morning tremors since recent ankle surgery, resolving after a couple of hours. No recent neurology follow-up since surgery. -Will check vitamin B12 and folate   Constipation, unspecified constipation type Assessment & Plan: Ongoing issue, worsened since recent ankle surgery likely due to decreased mobility. -Continue stool softener. -Consider adding Miralax  if no improvement. -Increase dietary fiber and water intake.      Return if symptoms worsen or fail to improve.   Vivi Piccirilli, NP

## 2023-06-27 ENCOUNTER — Other Ambulatory Visit (HOSPITAL_COMMUNITY): Payer: Self-pay | Admitting: Psychiatry

## 2023-06-27 DIAGNOSIS — G4709 Other insomnia: Secondary | ICD-10-CM

## 2023-06-27 DIAGNOSIS — F410 Panic disorder [episodic paroxysmal anxiety] without agoraphobia: Secondary | ICD-10-CM

## 2023-06-29 ENCOUNTER — Other Ambulatory Visit: Payer: Self-pay | Admitting: Podiatry

## 2023-06-29 ENCOUNTER — Other Ambulatory Visit (HOSPITAL_COMMUNITY): Payer: Self-pay | Admitting: Psychiatry

## 2023-06-29 DIAGNOSIS — G4709 Other insomnia: Secondary | ICD-10-CM

## 2023-06-29 DIAGNOSIS — F4310 Post-traumatic stress disorder, unspecified: Secondary | ICD-10-CM

## 2023-06-30 ENCOUNTER — Encounter: Payer: Self-pay | Admitting: Nurse Practitioner

## 2023-07-01 ENCOUNTER — Ambulatory Visit: Payer: 59 | Attending: Podiatry

## 2023-07-01 ENCOUNTER — Other Ambulatory Visit: Payer: Self-pay | Admitting: Podiatry

## 2023-07-01 ENCOUNTER — Ambulatory Visit: Payer: No Typology Code available for payment source | Admitting: Neurology

## 2023-07-01 ENCOUNTER — Encounter: Payer: Self-pay | Admitting: Podiatry

## 2023-07-01 ENCOUNTER — Other Ambulatory Visit: Payer: Self-pay | Admitting: Nurse Practitioner

## 2023-07-01 DIAGNOSIS — R262 Difficulty in walking, not elsewhere classified: Secondary | ICD-10-CM | POA: Insufficient documentation

## 2023-07-01 DIAGNOSIS — M25571 Pain in right ankle and joints of right foot: Secondary | ICD-10-CM | POA: Diagnosis present

## 2023-07-01 NOTE — Therapy (Signed)
 OUTPATIENT PHYSICAL THERAPY TREATMENT   Patient Name: Andrea [name] MRN: [id number] DOB:08-03-1980, 43 y.o., female Today's Date: 07/01/2023  END OF SESSION:  PT End of Session - 07/01/23 0734     Visit Number 2    Number of Visits 25    Date for PT Re-Evaluation 09/12/23    PT Start Time 0735    PT Stop Time 0812    PT Time Calculation (min) 37 min    Activity Tolerance Patient tolerated treatment well    Behavior During Therapy [facility] for tasks assessed/performed              Past Medical History:  Diagnosis Date   Anxiety    Asthma    Bipolar 1 disorder (HCC)    Bipolar 1 disorder, mixed, severe (HCC) 12/20/2016   Bipolar I disorder, most recent episode depressed (HCC) 12/20/2016   Complication of anesthesia    hard time waking up    Depression    Diabetes mellitus without complication (HCC)    Drug induced akathisia 04/02/2022   GERD (gastroesophageal reflux disease)    no meds   Hypertension    Kidney stones    Long term current use of antipsychotic medication 03/13/2022   Low back pain    Migraines    PCOS (polycystic ovarian syndrome)    PONV (postoperative nausea and vomiting)    Schizophrenia (HCC)    Seizure (HCC) 10/03/2021   Seizures (HCC) 06/04/2016   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   Shortness of breath dyspnea    with bronchitis   Past Surgical History:  Procedure Laterality Date   ABDOMINAL HYSTERECTOMY N/A 11/14/2015   Procedure: HYSTERECTOMY ABDOMINAL;  Surgeon: Oneil FORBES Piety, MD;  Location: [facility];  Service: Gynecology;  Laterality: N/A;   ANKLE FUSION Right 05/13/2023   Procedure: ARTHRODESIS ANKLE;  Surgeon: Malvin Marsa FALCON, DPM;  Location: [facility];  Service: Orthopedics/Podiatry;  Laterality: Right;  Hardware removal, right ankle tibiot talo calcaneal arthrodesis   CHOLECYSTECTOMY     INTRAUTERINE DEVICE (IUD) INSERTION  06/14/2014   [facility]   LYMPH NODES REMOVED     ORIF ANKLE FRACTURE  Right 05/03/2023   Procedure: OPEN REDUCTION INTERNAL FIXATION (ORIF) ANKLE FRACTURE;  Surgeon: Silva Juliene SAUNDERS, DPM;  Location: [facility];  Service: Orthopedics/Podiatry;  Laterality: Right;   ovarian cyst removed     SALPINGOOPHORECTOMY Bilateral 11/14/2015   Procedure: SALPINGO OOPHORECTOMY;  Surgeon: Oneil FORBES Piety, MD;  Location: [facility];  Service: Gynecology;  Laterality: Bilateral;   Patient Active Problem List   Diagnosis Date Noted   Syncope due to orthostatic hypotension 06/20/2023   Obesity (BMI 30-39.9) 05/14/2023   Acquired varus deformity of right ankle 05/13/2023   Postoperative surgical complication involving musculoskeletal system associated with musculoskeletal procedure 05/12/2023   Closed right ankle fracture 05/03/2023   Bipolar 1 disorder (HCC) 05/03/2023   Type 2 diabetes mellitus with peripheral neuropathy (HCC) 05/03/2023   Essential hypertension 05/03/2023   Mild persistent asthma 05/03/2023   Closed trimalleolar fracture of right ankle 05/03/2023   Pre-syncope 05/03/2023   Chronic pain 07/16/2022   PTSD (post-traumatic stress disorder) 03/13/2022   Generalized anxiety disorder with panic attacks 03/13/2022   Moderate episode of recurrent major depressive disorder (HCC) 03/13/2022   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 03/13/2022   Other insomnia 03/13/2022   Long term current use of antipsychotic medication 03/13/2022   Polypharmacy 03/13/2022   Convulsion (HCC) 11/26/2021  Irregular periods 09/26/2020   Mild persistent asthma with acute exacerbation 06/18/2017   Uncontrolled type 2 diabetes mellitus with hyperglycemia, with long-term current use of insulin  (HCC) 09/18/2016   HTN (hypertension) 09/18/2016   Pelvic pain in female 11/14/2015   Acute pyelonephritis 01/16/2013   Migraines 10/30/2012   Obesity, Class III, BMI 40-49.9 (morbid obesity) (HCC) 10/30/2012   Kidney stones     PCP: Leavy Waddell NOVAK,  FNP   REFERRING PROVIDER: Malvin Marsa FALCON, DPM   REFERRING DIAG: [phone] (ICD-10-CM) - Status post ORIF of fracture of ankle  THERAPY DIAG:  Pain in right ankle and joints of right foot  Difficulty in walking, not elsewhere classified  Rationale for Evaluation and Treatment: Rehabilitation  ONSET DATE: S/P 05/13/2023  SUBJECTIVE:   SUBJECTIVE STATEMENT: R ankle is doing better. A little sore this morning. The MD said that everything is healing nicely. NWB for 4 more weeks, next MD appointment is 07/29/2023.  PERTINENT HISTORY: S/P R ankle Tibiotalarcalcaneal arthrodesis on 05/13/2023. Pt is currently 5 weeks post op. Has not yet had PT for her procedure. This is a revision to her 05/03/2023 surgery. Pt went to get a box, got dizzy and fell, and broke her ankle. R ankle was crushing even more during her follow up appointment for her initial surgery leading to her current procedure. Pt currently non-weight bearing for 6-8 weeks.    Husband present  No latex allergies Blood pressure drops really low.  Last seizure was November 2023. Signs of seizure include feeling like she is going to black out.    PAIN:  Are you having pain? Yes: NPRS scale: 5/10 Pain location: R ankle Pain description: throbbing Aggravating factors: movement such as scooting around with the knee scooter Relieving factors: ice and heat.   PRECAUTIONS: Blood pressure drops really low.   RED FLAGS: Bowel or bladder incontinence: No and Cauda equina syndrome: No   WEIGHT BEARING RESTRICTIONS: Yes NWB for 6-8 weeks following surgery.   FALLS:  Has patient fallen in last 6 months? Yes. Number of falls 2  Low potassium caused the first fall, the low blood pressure caused to second fall  LIVING ENVIRONMENT: Lives with: lives with their spouse Lives in: House/apartment Stairs: Yes: External: 38 steps; on right going up, on left going up, and can reach both Has following equipment at home:  Vannie - 2 wheeled and Crutches and a knee scooter  OCCUPATION: environmental health practitioner, desk work. 45 minutes of sitting prior to standing break.   PLOF: Independent  PATIENT GOALS: Just want to be able to get on her feet again.   NEXT MD VISIT: February11, 2025  OBJECTIVE:  Note: Objective measures were completed at Evaluation unless otherwise noted.  DIAGNOSTIC FINDINGS:   DG Ankle Complete Right 05/13/2023  Narrative & Impression  CLINICAL DATA:  Postop.   EXAM: RIGHT TIBIA AND FIBULA - 2 VIEW; RIGHT ANKLE - COMPLETE 3+ VIEW; RIGHT OS CALCIS - 2+ VIEW   COMPARISON:  Preoperative imaging.   FINDINGS: Interval distal fibular resection. Intramedullary nail with proximal and distal locking screw traversing the tibia, talus, and calcaneus. Interval medial tibial resection. Posterior splint material in place. Multifocal skin staples.   IMPRESSION: Interval distal fibular resection and medial tibial resection. Ankle arthrodesis with intramedullary nail with traversing the tibia, talus, and calcaneus.     Electronically Signed   By: Andrea Gasman M.D.   On: 05/13/2023 17:41   PATIENT SURVEYS:  FOTO R ankle FOTO 11 (06/17/2023)  COGNITION: Overall cognitive status: Within functional limits for tasks assessed     SENSATION: Not tested  EDEMA:  Pt R ankle wrapped for swelling, unable to assess surgical incisions. Pt pt and husband, pt has an infection at her R lateral ankle incision and was given antibiotics about 1-2 weeks ago.      LOWER EXTREMITY MMT:  MMT Right eval Left eval  Hip flexion 4 4  Hip extension 3+ 4-  Hip abduction 4 4  Hip adduction    Hip internal rotation    Hip external rotation    Knee flexion 5 4+  Knee extension 5 5  Ankle dorsiflexion    Ankle plantarflexion    Ankle inversion    Ankle eversion     (Blank rows = not tested)  LOWER EXTREMITY SPECIAL TESTS:    FUNCTIONAL TESTS:    GAIT: Distance walked:  n/a Assistive device utilized:  Knee scooter for R LE Level of assistance: Modified independence Comments: NWB R LE with CAM boot                                                                                                                                TREATMENT DATE: 07/01/2023   Therapeutic Exercise  With CAM boot on  Seated LAQ R 10x5 seconds   Then with 2 lbs ankle weight 10x5 seconds for 3 sets  Seated R knee flexion green band 10x3  Seated hip adduction isometrics small ball squeeze 10x5 seconds for 2 sets  S/L hip abduction   R 5x3  L 5x3  Without CAM boot Supine toe flexion and extension 10x3     Cues to maintain NWB when performing sit to stand and stand pivot transfers secondary to pt observed to place weight onto R LE with CAM boot.   Improved exercise technique, movement at target joints, use of target muscles after mod verbal, visual, tactile cues.        PATIENT EDUCATION:  Education details: there-ex, HEP, POC Person educated: Patient and Spouse Education method: Explanation, Demonstration, Tactile cues, Verbal cues, and Handouts Education comprehension: verbalized understanding and returned demonstration  HOME EXERCISE PROGRAM: Access Code: [email or web address] URL: [email or web address] Date: 06/17/2023 Prepared by: Emil Glassman  Exercises - Sidelying Hip Abduction  - 1 x daily - 7 x weekly - 3 sets - 5 reps - Seated Hamstring Curl with Anchored Resistance  - 1 x daily - 7 x weekly - 3 sets - 10 reps - Seated Hip Adduction Isometrics with Ball  - 1 x daily - 7 x weekly - 3 sets - 10 reps - 5 seconds hold    ASSESSMENT:  CLINICAL IMPRESSION: Worked on R hip, knee and intrinsic foot muscle strength in NWB to promote ability to ambulate when appropriate. Good muscle use observed during exercises. Pt tolerated session well without aggravation of symptoms. Pt will benefit from continued skilled physical therapy services to improve strength  and function.  OBJECTIVE IMPAIRMENTS: Abnormal gait, decreased balance, decreased endurance, decreased mobility, difficulty walking, decreased ROM, decreased strength, improper body mechanics, postural dysfunction, and pain.   ACTIVITY LIMITATIONS: carrying, lifting, standing, squatting, stairs, transfers, and locomotion level  PARTICIPATION LIMITATIONS:   PERSONAL FACTORS: Fitness and 3+ comorbidities: anxiety, bipolar disorder, DM, LBP, schizophrenia, seizures  are also affecting patient's functional outcome.   REHAB POTENTIAL: Fair    CLINICAL DECISION MAKING: Stable/uncomplicated  EVALUATION COMPLEXITY: Low   GOALS: Goals reviewed with patient? Yes  SHORT TERM GOALS: Target date: 06/26/2022 Pt will be independent with her initial HEP to improve strength, function, and ability to ambulate with less difficulty.  Baseline: Pt has started her initial HEP (06/17/2023) Goal status: INITIAL    LONG TERM GOALS: Target date: 09/12/2023  Pt will improve her R ankle FOTO score by at least 10 points as a demonstration of improved function.  Baseline: R ankle FOTO 11 (06/17/2023) Goal status: INITIAL  2.  Pt will be able to ambulate at least 500 ft without CAM boot and without AD and no LOB to promote mobility.  Baseline: Pt currently wearing CAM boot and NWB R ankle, uses a knee scooter (06/17/2023) Goal status: INITIAL  3.  Pt will improve B hip abduction and extension strength by at least 1 MMT grade to promote ability to ambulate and perform standing tasks with less difficulty.  Baseline:  MMT Right eval Left eval  Hip extension 3+ 4-  Hip abduction 4 4   (06/17/2023)  Goal status: INITIAL    PLAN:  PT FREQUENCY: 2x/week  PT DURATION: 12 weeks  PLANNED INTERVENTIONS: 97110-Therapeutic exercises, 97530- Therapeutic activity, [id number]- Neuromuscular re-education, 97140- Manual therapy, [id number]- Gait training, [id number]- Electrical stimulation (unattended), [phone]-  Ionotophoresis 4mg /ml Dexamethasone , Patient/Family education, Balance training, and Stair training  PLAN FOR NEXT SESSION: hip and trunk strengthening, maintaining NWB until otherwise noted by MD, manual techniques, modalities PRN  Merrik Puebla PT, DPT  07/01/2023, 7:18 PM

## 2023-07-02 ENCOUNTER — Encounter: Payer: Self-pay | Admitting: Nurse Practitioner

## 2023-07-02 ENCOUNTER — Other Ambulatory Visit: Payer: Self-pay | Admitting: Nurse Practitioner

## 2023-07-02 ENCOUNTER — Ambulatory Visit: Payer: 59

## 2023-07-02 ENCOUNTER — Other Ambulatory Visit: Payer: Self-pay | Admitting: Podiatry

## 2023-07-02 ENCOUNTER — Telehealth: Payer: Self-pay

## 2023-07-02 DIAGNOSIS — K5900 Constipation, unspecified: Secondary | ICD-10-CM | POA: Insufficient documentation

## 2023-07-02 DIAGNOSIS — E119 Type 2 diabetes mellitus without complications: Secondary | ICD-10-CM | POA: Insufficient documentation

## 2023-07-02 DIAGNOSIS — Z8781 Personal history of (healed) traumatic fracture: Secondary | ICD-10-CM

## 2023-07-02 DIAGNOSIS — R251 Tremor, unspecified: Secondary | ICD-10-CM | POA: Insufficient documentation

## 2023-07-02 DIAGNOSIS — I959 Hypotension, unspecified: Secondary | ICD-10-CM | POA: Insufficient documentation

## 2023-07-02 MED ORDER — GABAPENTIN 600 MG PO TABS: 600.0000 mg | ORAL_TABLET | Freq: Three times a day (TID) | ORAL | 0 refills | Status: DC

## 2023-07-02 MED ORDER — FAMOTIDINE 40 MG PO TABS: 40.0000 mg | ORAL_TABLET | Freq: Two times a day (BID) | ORAL | 1 refills | Status: DC

## 2023-07-02 MED ORDER — VITAMIN D (ERGOCALCIFEROL) 1.25 MG (50000 UNIT) PO CAPS: 50000.0000 [IU] | ORAL_CAPSULE | ORAL | 0 refills | Status: DC

## 2023-07-02 MED ORDER — PANTOPRAZOLE SODIUM 40 MG PO TBEC: 40.0000 mg | DELAYED_RELEASE_TABLET | Freq: Every morning | ORAL | 1 refills | Status: DC

## 2023-07-02 MED ORDER — TRAZODONE HCL 150 MG PO TABS: 150.0000 mg | ORAL_TABLET | Freq: Every day | ORAL | 0 refills | Status: DC

## 2023-07-02 MED ORDER — SINGULAIR 10 MG PO TABS: 10.0000 mg | ORAL_TABLET | Freq: Every day | ORAL | 1 refills | Status: DC

## 2023-07-02 NOTE — Progress Notes (Signed)
New rx for gabapentin sent

## 2023-07-02 NOTE — Assessment & Plan Note (Signed)
 Check blood pressure consistently for two weeks, twice daily. Report blood pressure readings via MyChart in two weeks.

## 2023-07-02 NOTE — Assessment & Plan Note (Signed)
 New onset of morning tremors since recent ankle surgery, resolving after a couple of hours. No recent neurology follow-up since surgery. -Will check vitamin B12 and folate

## 2023-07-02 NOTE — Assessment & Plan Note (Signed)
 Continue current medication regimen.  Followed by psychiatry.

## 2023-07-02 NOTE — Assessment & Plan Note (Signed)
 Recent increase in frequency, possibly related to stress. Currently managed with Qulipta .  Followed by neurology -Continue current migraine management.

## 2023-07-02 NOTE — Assessment & Plan Note (Addendum)
 Recent episodes of low blood pressure with symptoms of dizziness and lightheadedness. No clear triggers identified. Minipress  was recently discontinued due to concerns of contributing to hypotension. -Check blood pressure consistently for two weeks -Report blood pressure readings via MyChart in two weeks. -Consider referral to nephrology if hypotension persists.

## 2023-07-02 NOTE — Telephone Encounter (Signed)
 Patient is scheduled for a B12 injection on  07/07/23 at 11:15.

## 2023-07-02 NOTE — Assessment & Plan Note (Signed)
 Ongoing issue, worsened since recent ankle surgery likely due to decreased mobility. -Continue stool softener. -Consider adding Miralax  if no improvement. -Increase dietary fiber and water intake.

## 2023-07-02 NOTE — Telephone Encounter (Signed)
 LMTCB and schedule B12 injection.

## 2023-07-02 NOTE — Assessment & Plan Note (Signed)
 Lab Results  Component Value Date   HGBA1C 6.4 (H) 05/03/2023  Continue Rybelsus .

## 2023-07-02 NOTE — Telephone Encounter (Signed)
 Left message to call the office back to schedule B12 injection. Okay to schedule a B12 injection.

## 2023-07-02 NOTE — Telephone Encounter (Signed)
 Please schedule appointment for vitamin B12  injection.

## 2023-07-04 ENCOUNTER — Telehealth: Payer: 59 | Admitting: Nurse Practitioner

## 2023-07-04 ENCOUNTER — Other Ambulatory Visit: Payer: Self-pay | Admitting: Podiatry

## 2023-07-04 DIAGNOSIS — J029 Acute pharyngitis, unspecified: Secondary | ICD-10-CM

## 2023-07-04 DIAGNOSIS — J40 Bronchitis, not specified as acute or chronic: Secondary | ICD-10-CM

## 2023-07-04 MED ORDER — DOXYCYCLINE HYCLATE 100 MG PO TABS: 100.0000 mg | ORAL_TABLET | Freq: Two times a day (BID) | ORAL | 0 refills | Status: AC

## 2023-07-04 MED ORDER — FLUTICASONE PROPIONATE 50 MCG/ACT NA SUSP: 2.0000 | Freq: Every day | NASAL | 6 refills | Status: DC

## 2023-07-04 MED ORDER — BENZONATATE 100 MG PO CAPS: 100.0000 mg | ORAL_CAPSULE | Freq: Three times a day (TID) | ORAL | 0 refills | Status: DC | PRN

## 2023-07-04 NOTE — Progress Notes (Signed)
E-Visit for Cough   We are sorry that you are not feeling well.  Here is how we plan to help!  Based on your presentation I believe you most likely have A cough due to bacteria.  When patients have a fever and a productive cough with a change in color or increased sputum production, we are concerned about bacterial bronchitis.  If left untreated it can progress to pneumonia.  If your symptoms do not improve with your treatment plan it is important that you contact your provider.   I have prescribed doxycycline 100mg  twice daily for 7 days    In addition you may use A prescription cough medication called Tessalon Perles 100mg . You may take 1-2 capsules every 8 hours as needed for your cough.    From your responses in the eVisit questionnaire you describe inflammation in the upper respiratory tract which is causing a significant cough.  This is commonly called Bronchitis and has four common causes:   Allergies Viral Infections Acid Reflux Bacterial Infection Allergies, viruses and acid reflux are treated by controlling symptoms or eliminating the cause. An example might be a cough caused by taking certain blood pressure medications. You stop the cough by changing the medication. Another example might be a cough caused by acid reflux. Controlling the reflux helps control the cough.  USE OF BRONCHODILATOR ("RESCUE") INHALERS: There is a risk from using your bronchodilator too frequently.  The risk is that over-reliance on a medication which only relaxes the muscles surrounding the breathing tubes can reduce the effectiveness of medications prescribed to reduce swelling and congestion of the tubes themselves.  Although you feel brief relief from the bronchodilator inhaler, your asthma may actually be worsening with the tubes becoming more swollen and filled with mucus.  This can delay other crucial treatments, such as oral steroid medications. If you need to use a bronchodilator inhaler daily, several  times per day, you should discuss this with your provider.  There are probably better treatments that could be used to keep your asthma under control.     HOME CARE Only take medications as instructed by your medical team. Complete the entire course of an antibiotic. Drink plenty of fluids and get plenty of rest. Avoid close contacts especially the very young and the elderly Cover your mouth if you cough or cough into your sleeve. Always remember to wash your hands A steam or ultrasonic humidifier can help congestion.   GET HELP RIGHT AWAY IF: You develop worsening fever. You become short of breath You cough up blood. Your symptoms persist after you have completed your treatment plan MAKE SURE YOU  Understand these instructions. Will watch your condition. Will get help right away if you are not doing well or get worse.    Thank you for choosing an e-visit.  Your e-visit answers were reviewed by a board certified advanced clinical practitioner to complete your personal care plan. Depending upon the condition, your plan could have included both over the counter or prescription medications.  Please review your pharmacy choice. Make sure the pharmacy is open so you can pick up prescription now. If there is a problem, you may contact your provider through [employer] and have the prescription routed to another pharmacy.  Your safety is important to [place]. If you have drug allergies check your prescription carefully.   For the next 24 hours you can use MyChart to ask questions about today's visit, request a non-urgent call back, or ask for a  work or school excuse. You will get an email in the next two days asking about your experience. I hope that your e-visit has been valuable and will speed your recovery. Meds ordered this encounter  Medications   benzonatate (TESSALON) 100 MG capsule    Sig: Take 1 capsule (100 mg total) by mouth 3 (three) times daily as needed.    Dispense:  30  capsule    Refill:  0   fluticasone (FLONASE) 50 MCG/ACT nasal spray    Sig: Place 2 sprays into both nostrils daily.    Dispense:  16 g    Refill:  6   doxycycline (VIBRA-TABS) 100 MG tablet    Sig: Take 1 tablet (100 mg total) by mouth 2 (two) times daily for 7 days.    Dispense:  14 tablet    Refill:  0    I spent approximately 5 minutes reviewing the patient's history, current symptoms and coordinating their care today.

## 2023-07-06 ENCOUNTER — Encounter: Payer: Self-pay | Admitting: Nurse Practitioner

## 2023-07-07 ENCOUNTER — Ambulatory Visit: Payer: 59

## 2023-07-07 ENCOUNTER — Other Ambulatory Visit: Payer: Self-pay | Admitting: Podiatry

## 2023-07-07 ENCOUNTER — Encounter: Payer: Self-pay | Admitting: Podiatry

## 2023-07-07 DIAGNOSIS — E538 Deficiency of other specified B group vitamins: Secondary | ICD-10-CM | POA: Diagnosis not present

## 2023-07-07 MED ORDER — CYANOCOBALAMIN 1000 MCG/ML IJ SOLN
1000.0000 ug | Freq: Once | INTRAMUSCULAR | Status: AC
  Administered 2023-07-07: 1000 ug via INTRAMUSCULAR

## 2023-07-07 MED ORDER — OXYCODONE-ACETAMINOPHEN 5-325 MG PO TABS: 1.0000 | ORAL_TABLET | ORAL | 0 refills | Status: AC | PRN

## 2023-07-07 NOTE — Progress Notes (Signed)
Refill of oxy sent

## 2023-07-07 NOTE — Progress Notes (Signed)
Pt presented for their vitamin B12 injection. Pt was identified through two identifiers. Pt tolerated shot well in their left  deltoid.  

## 2023-07-08 ENCOUNTER — Encounter: Payer: Self-pay | Admitting: Nurse Practitioner

## 2023-07-08 ENCOUNTER — Ambulatory Visit: Payer: 59 | Admitting: Student in an Organized Health Care Education/Training Program

## 2023-07-08 ENCOUNTER — Encounter: Payer: Self-pay | Admitting: Student in an Organized Health Care Education/Training Program

## 2023-07-08 VITALS — BP 134/96 | HR 77 | Temp 97.9°F | Ht 71.0 in | Wt 275.0 lb

## 2023-07-08 DIAGNOSIS — J4520 Mild intermittent asthma, uncomplicated: Secondary | ICD-10-CM | POA: Diagnosis not present

## 2023-07-08 NOTE — Progress Notes (Signed)
Assessment & Plan:   1. Mild intermittent asthma without complication (Primary)  Presents for evaluation of shortness of breath and episodic cough and wheeze that is suggestive of asthma. She was started on ICS/LABA with Symbicort with good response and overall improvement in symptoms. She's had her PFT's which show normal spirometry and normal DLCO, with mild reduction in TLC. The single breath for calculation of DLCO was normal. This suggests either effort related error in TLC or a component of restriction secondary to obesity. Most recent chest xray was normal.  Discussed this with the patient, and explained that with spirometry being normal her asthma is well controlled. I would continue with Symbicort two puffs twice daily as we are doing, and counseled her on the importance of weight loss.  -continue Symbicort two puffs twice daily -weight loss encouraged   Return in about 1 year (around 07/07/2024).  I spent 30 minutes caring for this patient today, including preparing to see the patient, obtaining a medical history , reviewing a separately obtained history, performing a medically appropriate examination and/or evaluation, counseling and educating the patient/family/caregiver, and documenting clinical information in the electronic health record  Raechel Chute, MD [facility] 07/08/2023 12:52 PM    End of visit medications:  No orders of the defined types were placed in this encounter.    Current Outpatient Medications:    albuterol (PROVENTIL) (2.5 MG/3ML) 0.083% nebulizer solution, Take 2.5 mg by nebulization every 4 (four) hours as needed., Disp: , Rfl:    Albuterol-Budesonide (AIRSUPRA) 90-80 MCG/ACT AERO, , Disp: , Rfl:    Atogepant (QULIPTA) 30 MG TABS, Take 1 tablet daily, Disp: 30 tablet, Rfl: 5   benzonatate (TESSALON) 100 MG capsule, Take 1 capsule (100 mg total) by mouth 3 (three) times daily as needed., Disp: 30 capsule, Rfl: 0   calcium  carbonate (OSCAL) 1500 (600 Ca) MG TABS tablet, Take 1,500 mg by mouth 2 (two) times daily with a meal., Disp: , Rfl:    doxycycline (VIBRA-TABS) 100 MG tablet, Take 1 tablet (100 mg total) by mouth 2 (two) times daily for 7 days., Disp: 14 tablet, Rfl: 0   DULoxetine (CYMBALTA) 60 MG capsule, Take 2 capsules (120 mg total) by mouth daily., Disp: 180 capsule, Rfl: 0   ELIQUIS 2.5 MG TABS tablet, TAKE 1 TABLET BY MOUTH TWICE A DAY, Disp: 60 tablet, Rfl: 0   famotidine (PEPCID) 40 MG tablet, Take 1 tablet (40 mg total) by mouth 2 (two) times daily., Disp: 60 tablet, Rfl: 1   gabapentin (NEURONTIN) 600 MG tablet, Take 1 tablet (600 mg total) by mouth 3 (three) times daily., Disp: 90 tablet, Rfl: 0   Galcanezumab-gnlm (EMGALITY) 120 MG/ML SOAJ, Inject 1 Pen into the skin every 30 (thirty) days., Disp: 1.12 mL, Rfl: 11   GEMTESA 75 MG TABS, Take 1 tablet by mouth daily., Disp: , Rfl:    ibuprofen (ADVIL) 800 MG tablet, TAKE 1 TABLET BY MOUTH EVERY 8 HOURS AS NEEDED, Disp: 30 tablet, Rfl: 1   levocetirizine (XYZAL) 5 MG tablet, Take 5 mg by mouth at bedtime., Disp: , Rfl:    montelukast (SINGULAIR) 10 MG tablet, Take 1 tablet (10 mg total) by mouth at bedtime., Disp: 90 tablet, Rfl: 0   oxyCODONE-acetaminophen (PERCOCET/ROXICET) 5-325 MG tablet, Take 1 tablet by mouth every 4 (four) hours as needed for up to 5 days for severe pain (pain score 7-10)., Disp: 15 tablet, Rfl: 0   pantoprazole (PROTONIX) 40 MG tablet, Take  1 tablet (40 mg total) by mouth every morning., Disp: 90 tablet, Rfl: 1   potassium chloride (MICRO-K) 10 MEQ CR capsule, Take 10 mEq by mouth daily., Disp: , Rfl:    prazosin (MINIPRESS) 1 MG capsule, Take 3 pills nightly for 1 week.  Then take 2 pills nightly for 1 week.  Then take 1 pill nightly for 1 week and discontinue., Disp: 60 capsule, Rfl: 0   QUEtiapine (SEROQUEL) 50 MG tablet, TAKE ONE TABLET IN THE MORNING, ONE AT MIDDAY, 2 AT NIGHT., Disp: 120 tablet, Rfl: 2   RYBELSUS 14 MG  TABS, Take 1 tablet by mouth daily., Disp: , Rfl:    SYMBICORT 160-4.5 MCG/ACT inhaler, Inhale 2 puffs into the lungs., Disp: , Rfl:    traZODone (DESYREL) 150 MG tablet, Take 1 tablet (150 mg total) by mouth at bedtime., Disp: 30 tablet, Rfl: 0   Vitamin D, Ergocalciferol, (DRISDOL) 1.25 MG (50000 UNIT) CAPS capsule, Take 1 capsule (50,000 Units total) by mouth every 7 (seven) days., Disp: 8 capsule, Rfl: 0   zonisamide (ZONEGRAN) 100 MG capsule, Take 5 capsules every night, Disp: 150 capsule, Rfl: 11   gabapentin (NEURONTIN) 300 MG capsule, Take 1 pill each morning, one at noon, and 2 at night. (Patient not taking: Reported on 07/08/2023), Disp: 120 capsule, Rfl: 1   Subjective:   PATIENT ID: Andrea [name] GENDER: female DOB: [birth year], MRN: [id number]  Chief Complaint  Patient presents with   Follow-up    Dry cough. No shortness of breath or wheezing.     HPI  Patient is a pleasant 43 year old female presenting for follow up of asthma.  Interval history shows symptoms to be somewhat improved. She's not had any exacerbations and her shortness of breath is somewhat better. She did have a fall and broke her ankle, requiring ORIF. She is now non-weight barring on the right foot. She is compliant with Symbicort. Denies any wheeze or cough, and feels somewhat less short of breath. She's had her PFT's and is here to discuss results. Was able to loose some weight in the past few months. She was started on Eliquis for DVT prophylaxis given the ankle fracture.  She carries a diagnosis of asthma for the last few years and is maintained on Symbicort that she uses "seasonally". She was seen in the ED where an evaluation was performed and was reassuring (CXR, d-dimer, troponin, BNP, CBC), she was discharged on prednisone as well as antibiotics with improvement.   Patient initially reported exertional dyspnea at rest as well as with exertion. This was associated with a cough that was nonproductive  and occasional wheezing. These symptoms are now improved.    Patient is a non-smoker and denies any occupational exposures.  Ancillary information including prior medications, full medical/surgical/family/social histories, and PFTs (when available) are listed below and have been reviewed.   Review of Systems  Constitutional:  Negative for chills, diaphoresis, fever, malaise/fatigue and weight loss.  Respiratory:  Positive for shortness of breath. Negative for cough, hemoptysis, sputum production and wheezing.   Cardiovascular:  Negative for chest pain and palpitations.  Skin:  Negative for rash.     Objective:   Vitals:   07/08/23 0856  BP: (!) 134/96  Pulse: 77  Temp: 97.9 F (36.6 C)  TempSrc: Oral  SpO2: 97%  Weight: 275 lb (124.7 kg)  Height: 5\' 11"  (1.803 m)   97% on RA  BMI Readings from Last 3 Encounters:  07/08/23 38.35 kg/m  06/26/23 39.25 kg/m  05/27/23 36.40 kg/m   Wt Readings from Last 3 Encounters:  07/08/23 275 lb (124.7 kg)  06/26/23 281 lb 6.4 oz (127.6 kg)  05/27/23 261 lb (118.4 kg)    Physical Exam Constitutional:      General: She is not in acute distress.    Appearance: Normal appearance. She is not ill-appearing.  Cardiovascular:     Rate and Rhythm: Normal rate and regular rhythm.     Pulses: Normal pulses.     Heart sounds: Normal heart sounds.  Pulmonary:     Effort: Pulmonary effort is normal. No respiratory distress.     Breath sounds: Normal breath sounds. No stridor. No wheezing, rhonchi or rales.  Chest:     Chest wall: No tenderness.  Abdominal:     Palpations: Abdomen is soft.  Musculoskeletal:     Right lower leg: No edema.     Left lower leg: No edema.  Neurological:     General: No focal deficit present.     Mental Status: She is alert and oriented to person, place, and time. Mental status is at baseline.       Ancillary Information    Past Medical History:  Diagnosis Date   Anxiety    Asthma    Bipolar 1  disorder (HCC)    Bipolar 1 disorder, mixed, severe (HCC) 12/20/2016   Bipolar I disorder, most recent episode depressed (HCC) 12/20/2016   Complication of anesthesia    hard time waking up    Depression    Diabetes mellitus without complication (HCC)    Drug induced akathisia 04/02/2022   GERD (gastroesophageal reflux disease)    no meds   Hypertension    Kidney stones    Long term current use of antipsychotic medication 03/13/2022   Low back pain    Migraines    PCOS (polycystic ovarian syndrome)    PONV (postoperative nausea and vomiting)    Schizophrenia (HCC)    Seizure (HCC) 10/03/2021   Seizures (HCC) 06/04/2016   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Marilynne Drivers 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   Shortness of breath dyspnea    with bronchitis     Family History  Problem Relation Age of Onset   Miscarriages / Stillbirths Mother    Hypertension Mother    Heart disease Mother    Hypertension Father    Healthy Father    Healthy Sister    Healthy Brother      Past Surgical History:  Procedure Laterality Date   ABDOMINAL HYSTERECTOMY N/A 11/14/2015   Procedure: HYSTERECTOMY ABDOMINAL;  Surgeon: Levi Aland, MD;  Location: [facility];  Service: Gynecology;  Laterality: N/A;   ANKLE FUSION Right 05/13/2023   Procedure: ARTHRODESIS ANKLE;  Surgeon: Pilar Plate, DPM;  Location: [facility];  Service: Orthopedics/Podiatry;  Laterality: Right;  Hardware removal, right ankle tibiot talo calcaneal arthrodesis   CHOLECYSTECTOMY     INTRAUTERINE DEVICE (IUD) INSERTION  06/14/2014   [facility]   LYMPH NODES REMOVED     ORIF ANKLE FRACTURE Right 05/03/2023   Procedure: OPEN REDUCTION INTERNAL FIXATION (ORIF) ANKLE FRACTURE;  Surgeon: Edwin Cap, DPM;  Location: [facility];  Service: Orthopedics/Podiatry;  Laterality: Right;   ovarian cyst removed     SALPINGOOPHORECTOMY Bilateral 11/14/2015   Procedure: SALPINGO OOPHORECTOMY;  Surgeon: Levi Aland, MD;  Location: [facility];  Service:  Gynecology;  Laterality: Bilateral;    Social History   Socioeconomic History   Marital status: Divorced  Spouse name: Not on file   Number of children: 0   Years of education: 63   Highest education level: Some college, no degree  Occupational History   Not on file  Tobacco Use   Smoking status: Never   Smokeless tobacco: Never  Vaping Use   Vaping status: Never Used  Substance and Sexual Activity   Alcohol use: Never   Drug use: Never   Sexual activity: Yes    Partners: Male  Other Topics Concern   Not on file  Social History Narrative   Right handed   Drinks caffeine   One story home   Social Drivers of Health   Financial Resource Strain: Medium Risk (06/23/2023)   Overall Financial Resource Strain (CARDIA)    Difficulty of Paying Living Expenses: Somewhat hard  Food Insecurity: Food Insecurity Present (06/23/2023)   Hunger Vital Sign    Worried About Running Out of Food in the Last Year: Sometimes true    Ran Out of Food in the Last Year: Sometimes true  Transportation Needs: No Transportation Needs (06/23/2023)   PRAPARE - Administrator, Civil Service (Medical): No    Lack of Transportation (Non-Medical): No  Physical Activity: Unknown (06/23/2023)   Exercise Vital Sign    Days of Exercise per Week: 0 days    Minutes of Exercise per Session: Not on file  Stress: Stress Concern Present (06/23/2023)   [employer] of Occupational Health - Occupational Stress Questionnaire    Feeling of Stress : Very much  Social Connections: Socially Integrated (06/23/2023)   Social Connection and Isolation Panel [NHANES]    Frequency of Communication with Friends and Family: More than three times a week    Frequency of Social Gatherings with Friends and Family: Once a week    Attends Religious Services: More than 4 times per year    Active Member of Clubs or Organizations: Yes    Attends Banker Meetings: More than 4 times per year    Marital Status:  Living with partner  Intimate Partner Violence: Not At Risk (05/12/2023)   Humiliation, Afraid, Rape, and Kick questionnaire    Fear of Current or Ex-Partner: No    Emotionally Abused: No    Physically Abused: No    Sexually Abused: No     Allergies  Allergen Reactions   Bactrim [Sulfamethoxazole-Trimethoprim] Hives and Itching   Codeine Hives and Itching     CBC    Component Value Date/Time   WBC 7.5 06/26/2023 1030   RBC 4.20 06/26/2023 1030   HGB 11.9 (L) 06/26/2023 1030   HCT 37.7 06/26/2023 1030   PLT 273.0 06/26/2023 1030   MCV 89.7 06/26/2023 1030   MCH 29.0 05/27/2023 0218   MCHC 31.7 06/26/2023 1030   RDW 14.5 06/26/2023 1030   LYMPHSABS 2.2 06/26/2023 1030   MONOABS 0.4 06/26/2023 1030   EOSABS 0.4 06/26/2023 1030   BASOSABS 0.1 06/26/2023 1030    Pulmonary Functions Testing Results:    Latest Ref Rng & Units 04/22/2023    4:15 PM  PFT Results  FVC-Pre L 3.54   FVC-Predicted Pre % 77   FVC-Post L 3.65   FVC-Predicted Post % 79   Pre FEV1/FVC % % 89   Post FEV1/FCV % % 90   FEV1-Pre L 3.14   FEV1-Predicted Pre % 85   FEV1-Post L 3.28   DLCO uncorrected ml/min/mmHg 30.77   DLCO UNC% % 113   DLVA Predicted % 135  TLC L 4.55   TLC % Predicted % 74   RV % Predicted % 55     Outpatient Medications Prior to Visit  Medication Sig Dispense Refill   albuterol (PROVENTIL) (2.5 MG/3ML) 0.083% nebulizer solution Take 2.5 mg by nebulization every 4 (four) hours as needed.     Albuterol-Budesonide (AIRSUPRA) 90-80 MCG/ACT AERO      Atogepant (QULIPTA) 30 MG TABS Take 1 tablet daily 30 tablet 5   benzonatate (TESSALON) 100 MG capsule Take 1 capsule (100 mg total) by mouth 3 (three) times daily as needed. 30 capsule 0   calcium carbonate (OSCAL) 1500 (600 Ca) MG TABS tablet Take 1,500 mg by mouth 2 (two) times daily with a meal.     doxycycline (VIBRA-TABS) 100 MG tablet Take 1 tablet (100 mg total) by mouth 2 (two) times daily for 7 days. 14 tablet 0    DULoxetine (CYMBALTA) 60 MG capsule Take 2 capsules (120 mg total) by mouth daily. 180 capsule 0   ELIQUIS 2.5 MG TABS tablet TAKE 1 TABLET BY MOUTH TWICE A DAY 60 tablet 0   famotidine (PEPCID) 40 MG tablet Take 1 tablet (40 mg total) by mouth 2 (two) times daily. 60 tablet 1   gabapentin (NEURONTIN) 600 MG tablet Take 1 tablet (600 mg total) by mouth 3 (three) times daily. 90 tablet 0   Galcanezumab-gnlm (EMGALITY) 120 MG/ML SOAJ Inject 1 Pen into the skin every 30 (thirty) days. 1.12 mL 11   GEMTESA 75 MG TABS Take 1 tablet by mouth daily.     ibuprofen (ADVIL) 800 MG tablet TAKE 1 TABLET BY MOUTH EVERY 8 HOURS AS NEEDED 30 tablet 1   levocetirizine (XYZAL) 5 MG tablet Take 5 mg by mouth at bedtime.     montelukast (SINGULAIR) 10 MG tablet Take 1 tablet (10 mg total) by mouth at bedtime. 90 tablet 0   oxyCODONE-acetaminophen (PERCOCET/ROXICET) 5-325 MG tablet Take 1 tablet by mouth every 4 (four) hours as needed for up to 5 days for severe pain (pain score 7-10). 15 tablet 0   pantoprazole (PROTONIX) 40 MG tablet Take 1 tablet (40 mg total) by mouth every morning. 90 tablet 1   potassium chloride (MICRO-K) 10 MEQ CR capsule Take 10 mEq by mouth daily.     prazosin (MINIPRESS) 1 MG capsule Take 3 pills nightly for 1 week.  Then take 2 pills nightly for 1 week.  Then take 1 pill nightly for 1 week and discontinue. 60 capsule 0   QUEtiapine (SEROQUEL) 50 MG tablet TAKE ONE TABLET IN THE MORNING, ONE AT MIDDAY, 2 AT NIGHT. 120 tablet 2   RYBELSUS 14 MG TABS Take 1 tablet by mouth daily.     SYMBICORT 160-4.5 MCG/ACT inhaler Inhale 2 puffs into the lungs.     traZODone (DESYREL) 150 MG tablet Take 1 tablet (150 mg total) by mouth at bedtime. 30 tablet 0   Vitamin D, Ergocalciferol, (DRISDOL) 1.25 MG (50000 UNIT) CAPS capsule Take 1 capsule (50,000 Units total) by mouth every 7 (seven) days. 8 capsule 0   zonisamide (ZONEGRAN) 100 MG capsule Take 5 capsules every night 150 capsule 11   gabapentin  (NEURONTIN) 300 MG capsule Take 1 pill each morning, one at noon, and 2 at night. (Patient not taking: Reported on 07/08/2023) 120 capsule 1   fluticasone (FLONASE) 50 MCG/ACT nasal spray Place 2 sprays into both nostrils daily. 16 g 6   No facility-administered medications prior to visit.

## 2023-07-09 ENCOUNTER — Ambulatory Visit: Payer: 59

## 2023-07-11 ENCOUNTER — Encounter: Payer: Self-pay | Admitting: Nurse Practitioner

## 2023-07-11 ENCOUNTER — Ambulatory Visit: Payer: 59 | Admitting: Nurse Practitioner

## 2023-07-11 VITALS — BP 112/64 | HR 81 | Temp 97.8°F | Ht 71.0 in | Wt 290.0 lb

## 2023-07-11 DIAGNOSIS — I10 Essential (primary) hypertension: Secondary | ICD-10-CM

## 2023-07-11 DIAGNOSIS — Z7984 Long term (current) use of oral hypoglycemic drugs: Secondary | ICD-10-CM | POA: Diagnosis not present

## 2023-07-11 DIAGNOSIS — E1142 Type 2 diabetes mellitus with diabetic polyneuropathy: Secondary | ICD-10-CM | POA: Diagnosis not present

## 2023-07-11 DIAGNOSIS — I998 Other disorder of circulatory system: Secondary | ICD-10-CM | POA: Diagnosis not present

## 2023-07-11 NOTE — Patient Instructions (Signed)
VISIT SUMMARY:  During today's visit, we discussed your recent health issues, including unstable blood pressure, high blood sugar levels, sleep disturbances, and tremors. We also talked about the importance of monitoring your blood pressure and blood sugar levels closely.  YOUR PLAN:  -UNSTABLE BLOOD PRESSURE: Unstable blood pressure means your blood pressure readings are fluctuating significantly, sometimes dropping to dangerously low levels. We recommend you consistently monitor and document your blood pressure readings, especially noting the time of day. We have made an urgent referral to a kidney specialist for further evaluation. Additionally, consider using an alarm bracelet for safety and have a snack before bedtime to help maintain your blood pressure levels.  -HYPERGLYCEMIA: Hyperglycemia means high blood sugar levels. Despite your current treatment with Rybelsus, your blood sugar levels remain elevated. Continue to monitor and document your blood glucose readings. We will review these readings in two weeks to determine if any adjustments to your medication are needed.  -INSOMNIA: Insomnia is difficulty in maintaining sleep. You are currently taking Trazodone 150mg  at bedtime. We advise you to take Trazodone 30 minutes before your desired sleep time. Additionally, consider adding a Magnesium supplement, which may help with sleep and migraines.  -TREMORS: Tremors are involuntary shaking movements. Your tremors may be related to low blood sugar levels. Continue to monitor your blood sugar levels and document any correlation with your tremor episodes.  INSTRUCTIONS:  Please follow up in one month with logs of your blood pressure and blood sugar readings. We will discuss these findings and adjust your treatment plan as necessary. Additionally, we will review your blood glucose readings in two weeks to see if any medication adjustments are needed.

## 2023-07-11 NOTE — Progress Notes (Signed)
Established Patient Office Visit  Subjective:  Patient ID: Andrea [name], female    DOB: September 16, 1980  Age: 43 y.o. MRN: [id number]  CC:  Chief Complaint  Patient presents with   Medical Management of Chronic Issues   Discussed the use of a AI scribe software for clinical note transcription with the patient, who gave verbal consent to proceed.   HPI  [name] with a complex medical history, including hypertension, diabetes, and tremors. Recently, she has been experiencing fluctuating blood pressure readings, with values ranging from high to dangerously low. The patient reported an episode of syncope associated with a low blood pressure reading. The patient's blood pressure tends to drop significantly at night, and she has experienced episodes of feeling unwell.  The patient's tremors have been variable, with some episodes being severe. The patient and her caregiver have noticed a correlation between the severity of the tremors and the patient's blood sugar levels. The tremors seem to be worse when the patient's blood sugar is low and improve as the blood sugar levels normalize.  The patient has also been experiencing sleep disturbances, with an inability to maintain sleep throughout the night. She typically falls asleep without difficulty but wakes up multiple times during the night. The patient is currently on Trazodone 150mg , taken at night, for sleep management.   HPI   Past Medical History:  Diagnosis Date   Anxiety    Asthma    Bipolar 1 disorder (HCC)    Bipolar 1 disorder, mixed, severe (HCC) 12/20/2016   Bipolar I disorder, most recent episode depressed (HCC) 12/20/2016   Complication of anesthesia    hard time waking up    Depression    Diabetes mellitus without complication (HCC)    Drug induced akathisia 04/02/2022   GERD (gastroesophageal reflux disease)    no meds   Hypertension    Kidney stones    Long term current use of antipsychotic medication  03/13/2022   Low back pain    Migraines    PCOS (polycystic ovarian syndrome)    PONV (postoperative nausea and vomiting)    Schizophrenia (HCC)    Seizure (HCC) 10/03/2021   Seizures (HCC) 06/04/2016   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Marilynne Drivers 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   Shortness of breath dyspnea    with bronchitis    Past Surgical History:  Procedure Laterality Date   ABDOMINAL HYSTERECTOMY N/A 11/14/2015   Procedure: HYSTERECTOMY ABDOMINAL;  Surgeon: Levi Aland, MD;  Location: [facility];  Service: Gynecology;  Laterality: N/A;   ANKLE FUSION Right 05/13/2023   Procedure: ARTHRODESIS ANKLE;  Surgeon: Pilar Plate, DPM;  Location: [facility];  Service: Orthopedics/Podiatry;  Laterality: Right;  Hardware removal, right ankle tibiot talo calcaneal arthrodesis   CHOLECYSTECTOMY     INTRAUTERINE DEVICE (IUD) INSERTION  06/14/2014   [facility]   LYMPH NODES REMOVED     ORIF ANKLE FRACTURE Right 05/03/2023   Procedure: OPEN REDUCTION INTERNAL FIXATION (ORIF) ANKLE FRACTURE;  Surgeon: Edwin Cap, DPM;  Location: [facility];  Service: Orthopedics/Podiatry;  Laterality: Right;   ovarian cyst removed     SALPINGOOPHORECTOMY Bilateral 11/14/2015   Procedure: SALPINGO OOPHORECTOMY;  Surgeon: Levi Aland, MD;  Location: [facility];  Service: Gynecology;  Laterality: Bilateral;    Family History  Problem Relation Age of Onset   Miscarriages / Stillbirths Mother    Hypertension Mother    Heart disease Mother    Hypertension Father    Healthy Father  Healthy Sister    Healthy Brother     Social History   Socioeconomic History   Marital status: Divorced    Spouse name: Not on file   Number of children: 0   Years of education: 12   Highest education level: Some college, no degree  Occupational History   Not on file  Tobacco Use   Smoking status: Never   Smokeless tobacco: Never  Vaping Use   Vaping status: Never Used  Substance and Sexual Activity   Alcohol use:  Never   Drug use: Never   Sexual activity: Yes    Partners: Male  Other Topics Concern   Not on file  Social History Narrative   Right handed   Drinks caffeine   One story home   Social Drivers of Health   Financial Resource Strain: Medium Risk (06/23/2023)   Overall Financial Resource Strain (CARDIA)    Difficulty of Paying Living Expenses: Somewhat hard  Food Insecurity: Food Insecurity Present (06/23/2023)   Hunger Vital Sign    Worried About Running Out of Food in the Last Year: Sometimes true    Ran Out of Food in the Last Year: Sometimes true  Transportation Needs: No Transportation Needs (06/23/2023)   PRAPARE - Administrator, Civil Service (Medical): No    Lack of Transportation (Non-Medical): No  Physical Activity: Unknown (06/23/2023)   Exercise Vital Sign    Days of Exercise per Week: 0 days    Minutes of Exercise per Session: Not on file  Stress: Stress Concern Present (06/23/2023)   [employer] of Occupational Health - Occupational Stress Questionnaire    Feeling of Stress : Very much  Social Connections: Socially Integrated (06/23/2023)   Social Connection and Isolation Panel [NHANES]    Frequency of Communication with Friends and Family: More than three times a week    Frequency of Social Gatherings with Friends and Family: Once a week    Attends Religious Services: More than 4 times per year    Active Member of [employer] or Organizations: Yes    Attends Banker Meetings: More than 4 times per year    Marital Status: Living with partner  Intimate Partner Violence: Not At Risk (05/12/2023)   Humiliation, Afraid, Rape, and Kick questionnaire    Fear of Current or Ex-Partner: No    Emotionally Abused: No    Physically Abused: No    Sexually Abused: No     Outpatient Medications Prior to Visit  Medication Sig Dispense Refill   albuterol (PROVENTIL) (2.5 MG/3ML) 0.083% nebulizer solution Take 2.5 mg by nebulization every 4 (four) hours as  needed.     Albuterol-Budesonide (AIRSUPRA) 90-80 MCG/ACT AERO      Atogepant (QULIPTA) 30 MG TABS Take 1 tablet daily 30 tablet 5   calcium carbonate (OSCAL) 1500 (600 Ca) MG TABS tablet Take 1,500 mg by mouth 2 (two) times daily with a meal.     doxycycline (VIBRA-TABS) 100 MG tablet Take 1 tablet (100 mg total) by mouth 2 (two) times daily for 7 days. 14 tablet 0   DULoxetine (CYMBALTA) 60 MG capsule Take 2 capsules (120 mg total) by mouth daily. 180 capsule 0   ELIQUIS 2.5 MG TABS tablet TAKE 1 TABLET BY MOUTH TWICE A DAY 60 tablet 0   famotidine (PEPCID) 40 MG tablet Take 1 tablet (40 mg total) by mouth 2 (two) times daily. 60 tablet 1   gabapentin (NEURONTIN) 300 MG capsule Take 1 pill  each morning, one at noon, and 2 at night. 120 capsule 1   gabapentin (NEURONTIN) 600 MG tablet Take 1 tablet (600 mg total) by mouth 3 (three) times daily. 90 tablet 0   Galcanezumab-gnlm (EMGALITY) 120 MG/ML SOAJ Inject 1 Pen into the skin every 30 (thirty) days. 1.12 mL 11   GEMTESA 75 MG TABS Take 1 tablet by mouth daily.     ibuprofen (ADVIL) 800 MG tablet TAKE 1 TABLET BY MOUTH EVERY 8 HOURS AS NEEDED 30 tablet 1   levocetirizine (XYZAL) 5 MG tablet Take 5 mg by mouth at bedtime.     montelukast (SINGULAIR) 10 MG tablet Take 1 tablet (10 mg total) by mouth at bedtime. 90 tablet 0   oxyCODONE-acetaminophen (PERCOCET/ROXICET) 5-325 MG tablet Take 1 tablet by mouth every 4 (four) hours as needed for up to 5 days for severe pain (pain score 7-10). 15 tablet 0   pantoprazole (PROTONIX) 40 MG tablet Take 1 tablet (40 mg total) by mouth every morning. 90 tablet 1   potassium chloride (MICRO-K) 10 MEQ CR capsule Take 10 mEq by mouth daily.     prazosin (MINIPRESS) 1 MG capsule Take 3 pills nightly for 1 week.  Then take 2 pills nightly for 1 week.  Then take 1 pill nightly for 1 week and discontinue. 60 capsule 0   QUEtiapine (SEROQUEL) 50 MG tablet TAKE ONE TABLET IN THE MORNING, ONE AT MIDDAY, 2 AT NIGHT. 120  tablet 2   RYBELSUS 14 MG TABS Take 1 tablet by mouth daily.     SYMBICORT 160-4.5 MCG/ACT inhaler Inhale 2 puffs into the lungs.     traZODone (DESYREL) 150 MG tablet Take 1 tablet (150 mg total) by mouth at bedtime. 30 tablet 0   Vitamin D, Ergocalciferol, (DRISDOL) 1.25 MG (50000 UNIT) CAPS capsule Take 1 capsule (50,000 Units total) by mouth every 7 (seven) days. 8 capsule 0   zonisamide (ZONEGRAN) 100 MG capsule Take 5 capsules every night 150 capsule 11   benzonatate (TESSALON) 100 MG capsule Take 1 capsule (100 mg total) by mouth 3 (three) times daily as needed. 30 capsule 0   No facility-administered medications prior to visit.    Allergies  Allergen Reactions   Bactrim [Sulfamethoxazole-Trimethoprim] Hives and Itching   Codeine Hives and Itching    ROS Review of Systems Negative unless indicated in HPI.    Objective:    Physical Exam  BP 112/64   Pulse 81   Temp 97.8 F (36.6 C)   Ht 5\' 11"  (1.803 m)   Wt 290 lb (131.5 kg)   LMP  (LMP Unknown)   SpO2 96%   BMI 40.45 kg/m  Wt Readings from Last 3 Encounters:  07/18/23 290 lb (131.5 kg)  07/11/23 290 lb (131.5 kg)  07/08/23 275 lb (124.7 kg)     Health Maintenance  Topic Date Due   COVID-19 Vaccine (1) Never done   Pneumococcal Vaccine 4-37 Years old (1 of 2 - PCV) Never done   FOOT EXAM  Never done   OPHTHALMOLOGY EXAM  Never done   Hepatitis C Screening  Never done   DTaP/Tdap/Td (2 - Tdap) 02/16/2015   Cervical Cancer Screening (HPV/Pap Cotest)  03/27/2015   HEMOGLOBIN A1C  10/31/2023   Diabetic kidney evaluation - eGFR measurement  06/25/2024   Diabetic kidney evaluation - Urine ACR  06/25/2024   INFLUENZA VACCINE  Completed   HIV Screening  Completed   HPV VACCINES  Aged Out  There are no preventive care reminders to display for this patient.  Lab Results  Component Value Date   TSH 2.132 07/31/2021   Lab Results  Component Value Date   WBC 7.5 06/26/2023   HGB 11.9 (L) 06/26/2023    HCT 37.7 06/26/2023   MCV 89.7 06/26/2023   PLT 273.0 06/26/2023   Lab Results  Component Value Date   NA 141 06/26/2023   K 4.2 06/26/2023   CO2 28 06/26/2023   GLUCOSE 140 (H) 06/26/2023   BUN 23 06/26/2023   CREATININE 0.81 06/26/2023   BILITOT 0.3 06/26/2023   ALKPHOS 110 06/26/2023   AST 12 06/26/2023   ALT 12 06/26/2023   PROT 6.9 06/26/2023   ALBUMIN 4.3 06/26/2023   CALCIUM 9.6 06/26/2023   ANIONGAP 8 05/27/2023   GFR 89.28 06/26/2023   Lab Results  Component Value Date   CHOL 238 (H) 06/26/2023   Lab Results  Component Value Date   HDL 46.70 06/26/2023   Lab Results  Component Value Date   LDLCALC 149 (H) 06/26/2023   Lab Results  Component Value Date   TRIG 209.0 (H) 06/26/2023   Lab Results  Component Value Date   CHOLHDL 5 06/26/2023   Lab Results  Component Value Date   HGBA1C 6.4 (H) 05/03/2023      Assessment & Plan:  Fluctuating blood pressure Assessment & Plan: Fluctuating blood pressure readings with episodes of hypotension leading to falls. Discussed the importance of consistent monitoring and documentation of blood pressure readings including time of day. -Referral to Nephrology for further evaluation. -Consider use of an alarm bracelet for safety.  Orders: -     Ambulatory referral to Nephrology  Primary hypertension Assessment & Plan: Fluctuating blood pressure readings with episodes of hypotension leading to falls. Discussed the importance of consistent monitoring and documentation of blood pressure readings including time of day. -Referral to Nephrology for further evaluation. -Consider use of an alarm bracelet for safety.    Type 2 diabetes mellitus with peripheral neuropathy (HCC) Assessment & Plan: Elevated blood glucose readings at home despite current treatment with Rybelsus. -Continue monitoring blood glucose levels and document readings. -Review blood glucose readings in two weeks to adjust medication if  necessary.      Follow-up: Return in about 1 month (around 08/11/2023) for hypertension, diabetes.   Kara Dies, NP

## 2023-07-14 ENCOUNTER — Telehealth: Payer: Self-pay

## 2023-07-14 ENCOUNTER — Ambulatory Visit: Payer: 59

## 2023-07-14 ENCOUNTER — Ambulatory Visit (INDEPENDENT_AMBULATORY_CARE_PROVIDER_SITE_OTHER): Payer: 59

## 2023-07-14 ENCOUNTER — Encounter: Payer: Self-pay | Admitting: Podiatry

## 2023-07-14 ENCOUNTER — Encounter: Payer: Self-pay | Admitting: Nurse Practitioner

## 2023-07-14 DIAGNOSIS — E538 Deficiency of other specified B group vitamins: Secondary | ICD-10-CM | POA: Diagnosis not present

## 2023-07-14 MED ORDER — CYANOCOBALAMIN 1000 MCG/ML IJ SOLN
1000.0000 ug | Freq: Once | INTRAMUSCULAR | Status: AC
  Administered 2023-07-14: 1000 ug via INTRAMUSCULAR

## 2023-07-14 NOTE — Progress Notes (Signed)
Pt presented for their vitamin B12 injection. Pt was identified through two identifiers. Pt tolerated shot well in their right deltoid.

## 2023-07-14 NOTE — Telephone Encounter (Signed)
No show. Called patient and left a message pertaining to appointment and a reminder for the next follow up session. Return phone call requested. Phone number [phone]) provided.

## 2023-07-16 ENCOUNTER — Ambulatory Visit: Payer: 59

## 2023-07-16 ENCOUNTER — Telehealth: Payer: 59 | Admitting: Physician Assistant

## 2023-07-16 DIAGNOSIS — R3989 Other symptoms and signs involving the genitourinary system: Secondary | ICD-10-CM

## 2023-07-16 MED ORDER — CEPHALEXIN 500 MG PO CAPS: 500.0000 mg | ORAL_CAPSULE | Freq: Two times a day (BID) | ORAL | 0 refills | Status: DC

## 2023-07-16 NOTE — Progress Notes (Signed)
I have spent 5 minutes in review of e-visit questionnaire, review and updating patient chart, medical decision making and response to patient.   Piedad Climes, PA-C

## 2023-07-16 NOTE — Progress Notes (Signed)

## 2023-07-17 ENCOUNTER — Encounter: Payer: Self-pay | Admitting: Nurse Practitioner

## 2023-07-17 NOTE — Telephone Encounter (Signed)
Please call pt and check how she is doing after starting the antibiotic. If here symptoms not improving she needs to be evaluated in person.

## 2023-07-17 NOTE — Telephone Encounter (Signed)
Called Patient and she states she is having back  pain below her waistline wrapping around to her right side. Patient states the back pain is 8-9 / 10 and she is still having urine frequency. Scheduled appointment for 07/18/23 at 11:20.

## 2023-07-17 NOTE — Telephone Encounter (Signed)
Noted

## 2023-07-18 ENCOUNTER — Encounter: Payer: Self-pay | Admitting: Nurse Practitioner

## 2023-07-18 ENCOUNTER — Ambulatory Visit (INDEPENDENT_AMBULATORY_CARE_PROVIDER_SITE_OTHER): Payer: 59 | Admitting: Nurse Practitioner

## 2023-07-18 ENCOUNTER — Other Ambulatory Visit: Payer: Self-pay | Admitting: Nurse Practitioner

## 2023-07-18 ENCOUNTER — Ambulatory Visit
Admission: RE | Admit: 2023-07-18 | Discharge: 2023-07-18 | Disposition: A | Discharge status: Home / Self Care | Payer: 59 | Source: Ambulatory Visit | Attending: Nurse Practitioner

## 2023-07-18 ENCOUNTER — Telehealth: Payer: Self-pay | Admitting: Nurse Practitioner

## 2023-07-18 VITALS — BP 124/72 | HR 81 | Temp 97.6°F | Ht 71.0 in | Wt 290.0 lb

## 2023-07-18 DIAGNOSIS — I998 Other disorder of circulatory system: Secondary | ICD-10-CM | POA: Insufficient documentation

## 2023-07-18 DIAGNOSIS — R109 Unspecified abdominal pain: Secondary | ICD-10-CM | POA: Insufficient documentation

## 2023-07-18 DIAGNOSIS — Z7984 Long term (current) use of oral hypoglycemic drugs: Secondary | ICD-10-CM

## 2023-07-18 DIAGNOSIS — E119 Type 2 diabetes mellitus without complications: Secondary | ICD-10-CM

## 2023-07-18 DIAGNOSIS — R10A1 Flank pain, right side: Secondary | ICD-10-CM | POA: Insufficient documentation

## 2023-07-18 DIAGNOSIS — R350 Frequency of micturition: Secondary | ICD-10-CM | POA: Diagnosis not present

## 2023-07-18 DIAGNOSIS — M545 Low back pain, unspecified: Secondary | ICD-10-CM | POA: Insufficient documentation

## 2023-07-18 LAB — POC URINALSYSI DIPSTICK (AUTOMATED)
Bilirubin, UA: NEGATIVE
Blood, UA: NEGATIVE
Glucose, UA: NEGATIVE
Ketones, UA: NEGATIVE
Nitrite, UA: NEGATIVE
Protein, UA: NEGATIVE
Spec Grav, UA: 1.02 (ref 1.010–1.025)
Urobilinogen, UA: 0.2 U/dL
pH, UA: 7 (ref 5.0–8.0)

## 2023-07-18 MED ORDER — OXYCODONE-ACETAMINOPHEN 5-325 MG PO TABS: 1.0000 | ORAL_TABLET | Freq: Three times a day (TID) | ORAL | 0 refills | Status: AC | PRN

## 2023-07-18 NOTE — Assessment & Plan Note (Signed)
Fluctuating blood pressure readings with episodes of hypotension leading to falls. Discussed the importance of consistent monitoring and documentation of blood pressure readings including time of day. -Referral to Nephrology for further evaluation. -Consider use of an alarm bracelet for safety.

## 2023-07-18 NOTE — Telephone Encounter (Signed)
Patient would like to know if Evelene Croon can call her in something for the pain she is having. Patient was seen by Evelene Croon today at [facility]

## 2023-07-18 NOTE — Assessment & Plan Note (Addendum)
Elevated blood glucose readings at home despite current treatment with Rybelsus. -Continue monitoring blood glucose levels and document readings. -Review blood glucose readings in two weeks to adjust medication if necessary.

## 2023-07-18 NOTE — Progress Notes (Addendum)
Established Patient Office Visit  Subjective:  Patient ID: Andrea [name], female    DOB: 03-Aug-1980  Age: 43 y.o. MRN: [id number]  CC:  Chief Complaint  Patient presents with   Back Pain    Back pain 8-9 /10 & urine frequency   Discussed the use of a AI scribe software for clinical note transcription with the patient, who gave verbal consent to proceed.  HPI  Andrea [name] presents with low back pain that started She has history of kidney stones, presents with severe intermittent back pain and urinary frequency.  She experiences severe intermittent back pain, which eases off at times but returns with increased intensity. The pain is localized to the back and does not radiate to the front. It lasts for at least 45 minutes and is somewhat alleviated by movement and laying down. She has a history of kidney stones, although it has been several years since the last occurrence, and she does not recall if the stone was resolved. She started an antibiotic yesterday, which may have affected her urinalysis results. For pain management, she is taking ibuprofen 800 mg and Tylenol, but these are not providing significant relief.  She denies any burning sensation or difficulty during urination but reports frequent urination. She has been on Gemtesa for urinary frequency, which started after a bladder injury during a hysterectomy. Initially, she was on oxybutynin before switching to [place].  Her blood sugar levels are elevated, with fasting readings of 234, 247, and 253. She takes Rybelsus every morning and previously used Jardiance, which was discontinued due to concerns about low blood pressure. While on Jardiance, her blood sugar levels were well-controlled. She has not tried glipizide.  She also reports episodes of dizziness when her blood pressure is low, which she manages by consuming Gatorade and salty snacks like pretzels. She is not currently on any blood pressure medication.   Back  Pain     Past Medical History:  Diagnosis Date   Anxiety    Asthma    Bipolar 1 disorder (HCC)    Bipolar 1 disorder, mixed, severe (HCC) 12/20/2016   Bipolar I disorder, most recent episode depressed (HCC) 12/20/2016   Complication of anesthesia    hard time waking up    Depression    Diabetes mellitus without complication (HCC)    Drug induced akathisia 04/02/2022   GERD (gastroesophageal reflux disease)    no meds   Hypertension    Kidney stones    Long term current use of antipsychotic medication 03/13/2022   Low back pain    Migraines    PCOS (polycystic ovarian syndrome)    PONV (postoperative nausea and vomiting)    Schizophrenia (HCC)    Seizure (HCC) 10/03/2021   Seizures (HCC) 06/04/2016   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Marilynne Drivers 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   Shortness of breath dyspnea    with bronchitis    Past Surgical History:  Procedure Laterality Date   ABDOMINAL HYSTERECTOMY N/A 11/14/2015   Procedure: HYSTERECTOMY ABDOMINAL;  Surgeon: Levi Aland, MD;  Location: [facility];  Service: Gynecology;  Laterality: N/A;   ANKLE FUSION Right 05/13/2023   Procedure: ARTHRODESIS ANKLE;  Surgeon: Pilar Plate, DPM;  Location: [facility];  Service: Orthopedics/Podiatry;  Laterality: Right;  Hardware removal, right ankle tibiot talo calcaneal arthrodesis   CHOLECYSTECTOMY     INTRAUTERINE DEVICE (IUD) INSERTION  06/14/2014   [facility]   LYMPH NODES REMOVED     ORIF ANKLE FRACTURE Right 05/03/2023  Procedure: OPEN REDUCTION INTERNAL FIXATION (ORIF) ANKLE FRACTURE;  Surgeon: Edwin Cap, DPM;  Location: [facility];  Service: Orthopedics/Podiatry;  Laterality: Right;   ovarian cyst removed     SALPINGOOPHORECTOMY Bilateral 11/14/2015   Procedure: SALPINGO OOPHORECTOMY;  Surgeon: Levi Aland, MD;  Location: [facility];  Service: Gynecology;  Laterality: Bilateral;    Family History  Problem Relation Age of Onset   Miscarriages / Stillbirths Mother     Hypertension Mother    Heart disease Mother    Hypertension Father    Healthy Father    Healthy Sister    Healthy Brother     Social History   Socioeconomic History   Marital status: Divorced    Spouse name: Not on file   Number of children: 0   Years of education: 12   Highest education level: Some college, no degree  Occupational History   Not on file  Tobacco Use   Smoking status: Never   Smokeless tobacco: Never  Vaping Use   Vaping status: Never Used  Substance and Sexual Activity   Alcohol use: Never   Drug use: Never   Sexual activity: Yes    Partners: Male  Other Topics Concern   Not on file  Social History Narrative   Right handed   Drinks caffeine   One story home   Social Drivers of Health   Financial Resource Strain: Medium Risk (06/23/2023)   Overall Financial Resource Strain (CARDIA)    Difficulty of Paying Living Expenses: Somewhat hard  Food Insecurity: Food Insecurity Present (06/23/2023)   Hunger Vital Sign    Worried About Running Out of Food in the Last Year: Sometimes true    Ran Out of Food in the Last Year: Sometimes true  Transportation Needs: No Transportation Needs (06/23/2023)   PRAPARE - Administrator, Civil Service (Medical): No    Lack of Transportation (Non-Medical): No  Physical Activity: Unknown (06/23/2023)   Exercise Vital Sign    Days of Exercise per Week: 0 days    Minutes of Exercise per Session: Not on file  Stress: Stress Concern Present (06/23/2023)   [employer] of Occupational Health - Occupational Stress Questionnaire    Feeling of Stress : Very much  Social Connections: Socially Integrated (06/23/2023)   Social Connection and Isolation Panel [NHANES]    Frequency of Communication with Friends and Family: More than three times a week    Frequency of Social Gatherings with Friends and Family: Once a week    Attends Religious Services: More than 4 times per year    Active Member of [employer] or Organizations: Yes     Attends Banker Meetings: More than 4 times per year    Marital Status: Living with partner  Intimate Partner Violence: Not At Risk (05/12/2023)   Humiliation, Afraid, Rape, and Kick questionnaire    Fear of Current or Ex-Partner: No    Emotionally Abused: No    Physically Abused: No    Sexually Abused: No     Outpatient Medications Prior to Visit  Medication Sig Dispense Refill   albuterol (PROVENTIL) (2.5 MG/3ML) 0.083% nebulizer solution Take 2.5 mg by nebulization every 4 (four) hours as needed.     Albuterol-Budesonide (AIRSUPRA) 90-80 MCG/ACT AERO      Atogepant (QULIPTA) 30 MG TABS Take 1 tablet daily 30 tablet 5   calcium carbonate (OSCAL) 1500 (600 Ca) MG TABS tablet Take 1,500 mg by mouth 2 (two) times daily with  a meal.     cephALEXin (KEFLEX) 500 MG capsule Take 1 capsule (500 mg total) by mouth 2 (two) times daily for 7 days. 14 capsule 0   DULoxetine (CYMBALTA) 60 MG capsule Take 2 capsules (120 mg total) by mouth daily. 180 capsule 0   ELIQUIS 2.5 MG TABS tablet TAKE 1 TABLET BY MOUTH TWICE A DAY 60 tablet 0   famotidine (PEPCID) 40 MG tablet Take 1 tablet (40 mg total) by mouth 2 (two) times daily. 60 tablet 1   gabapentin (NEURONTIN) 300 MG capsule Take 1 pill each morning, one at noon, and 2 at night. 120 capsule 1   gabapentin (NEURONTIN) 600 MG tablet Take 1 tablet (600 mg total) by mouth 3 (three) times daily. 90 tablet 0   Galcanezumab-gnlm (EMGALITY) 120 MG/ML SOAJ Inject 1 Pen into the skin every 30 (thirty) days. 1.12 mL 11   GEMTESA 75 MG TABS Take 1 tablet by mouth daily.     ibuprofen (ADVIL) 800 MG tablet TAKE 1 TABLET BY MOUTH EVERY 8 HOURS AS NEEDED 30 tablet 1   levocetirizine (XYZAL) 5 MG tablet Take 5 mg by mouth at bedtime.     montelukast (SINGULAIR) 10 MG tablet Take 1 tablet (10 mg total) by mouth at bedtime. 90 tablet 0   pantoprazole (PROTONIX) 40 MG tablet Take 1 tablet (40 mg total) by mouth every morning. 90 tablet 1   potassium  chloride (MICRO-K) 10 MEQ CR capsule Take 10 mEq by mouth daily.     prazosin (MINIPRESS) 1 MG capsule Take 3 pills nightly for 1 week.  Then take 2 pills nightly for 1 week.  Then take 1 pill nightly for 1 week and discontinue. 60 capsule 0   QUEtiapine (SEROQUEL) 50 MG tablet TAKE ONE TABLET IN THE MORNING, ONE AT MIDDAY, 2 AT NIGHT. 120 tablet 2   RYBELSUS 14 MG TABS Take 1 tablet by mouth daily.     SYMBICORT 160-4.5 MCG/ACT inhaler Inhale 2 puffs into the lungs.     traZODone (DESYREL) 150 MG tablet Take 1 tablet (150 mg total) by mouth at bedtime. 30 tablet 0   Vitamin D, Ergocalciferol, (DRISDOL) 1.25 MG (50000 UNIT) CAPS capsule Take 1 capsule (50,000 Units total) by mouth every 7 (seven) days. 8 capsule 0   zonisamide (ZONEGRAN) 100 MG capsule Take 5 capsules every night 150 capsule 11   No facility-administered medications prior to visit.    Allergies  Allergen Reactions   Bactrim [Sulfamethoxazole-Trimethoprim] Hives and Itching   Codeine Hives and Itching    ROS Review of Systems  Musculoskeletal:  Positive for back pain.   Negative unless indicated in HPI.    Objective:    Physical Exam Constitutional:      Appearance: Normal appearance.  Cardiovascular:     Rate and Rhythm: Normal rate and regular rhythm.     Pulses: Normal pulses.     Heart sounds: Normal heart sounds.  Abdominal:     General: Bowel sounds are normal.     Palpations: Abdomen is soft.     Tenderness: There is no abdominal tenderness. There is right CVA tenderness. There is no left CVA tenderness.  Musculoskeletal:     Cervical back: Normal range of motion.  Neurological:     General: No focal deficit present.     Mental Status: She is alert. Mental status is at baseline.  Psychiatric:        Mood and Affect: Mood normal.  Behavior: Behavior normal.        Thought Content: Thought content normal.        Judgment: Judgment normal.     BP 124/72   Pulse 81   Temp 97.6 F (36.4 C)    Ht 5\' 11"  (1.803 m)   Wt 290 lb (131.5 kg)   LMP  (LMP Unknown)   SpO2 95%   BMI 40.45 kg/m  Wt Readings from Last 3 Encounters:  07/18/23 290 lb (131.5 kg)  07/11/23 290 lb (131.5 kg)  07/08/23 275 lb (124.7 kg)     Health Maintenance  Topic Date Due   COVID-19 Vaccine (1) Never done   Pneumococcal Vaccine 47-71 Years old (1 of 2 - PCV) Never done   FOOT EXAM  Never done   OPHTHALMOLOGY EXAM  Never done   Hepatitis C Screening  Never done   DTaP/Tdap/Td (2 - Tdap) 02/16/2015   Cervical Cancer Screening (HPV/Pap Cotest)  03/27/2015   HEMOGLOBIN A1C  10/31/2023   Diabetic kidney evaluation - eGFR measurement  06/25/2024   Diabetic kidney evaluation - Urine ACR  06/25/2024   INFLUENZA VACCINE  Completed   HIV Screening  Completed   HPV VACCINES  Aged Out    There are no preventive care reminders to display for this patient.  Lab Results  Component Value Date   TSH 2.132 07/31/2021   Lab Results  Component Value Date   WBC 7.5 06/26/2023   HGB 11.9 (L) 06/26/2023   HCT 37.7 06/26/2023   MCV 89.7 06/26/2023   PLT 273.0 06/26/2023   Lab Results  Component Value Date   NA 141 06/26/2023   K 4.2 06/26/2023   CO2 28 06/26/2023   GLUCOSE 140 (H) 06/26/2023   BUN 23 06/26/2023   CREATININE 0.81 06/26/2023   BILITOT 0.3 06/26/2023   ALKPHOS 110 06/26/2023   AST 12 06/26/2023   ALT 12 06/26/2023   PROT 6.9 06/26/2023   ALBUMIN 4.3 06/26/2023   CALCIUM 9.6 06/26/2023   ANIONGAP 8 05/27/2023   GFR 89.28 06/26/2023   Lab Results  Component Value Date   CHOL 238 (H) 06/26/2023   Lab Results  Component Value Date   HDL 46.70 06/26/2023   Lab Results  Component Value Date   LDLCALC 149 (H) 06/26/2023   Lab Results  Component Value Date   TRIG 209.0 (H) 06/26/2023   Lab Results  Component Value Date   CHOLHDL 5 06/26/2023   Lab Results  Component Value Date   HGBA1C 6.4 (H) 05/03/2023      Assessment & Plan:  Urine frequency -     POCT  Urinalysis Dipstick (Automated) -     Urine Culture  Acute right flank pain Assessment & Plan: Severe, intermittent flank pain with a history of kidney stones. Urinalysis is normal, possibly due to recent initiation of antibiotics for suspected UTI. No dysuria. Have urinary frequency. -Order CT scan to evaluate for kidney stones. -Continue current pain management with Ibuprofen 800mg  and Tylenol as needed.  Orders: -     POCT Urinalysis Dipstick (Automated) -     Urine Culture -     CT RENAL STONE STUDY; Future  Diabetes mellitus treated with oral medication (HCC) Assessment & Plan: Fasting blood glucose readings are consistently in the 200s despite taking Rybelsus daily. Patient has a history of good control on Jardiance, but it was discontinued due to concerns about hypotension. -Check A1C      Follow-up: No follow-ups  on file.   Kara Dies, NP

## 2023-07-18 NOTE — Assessment & Plan Note (Signed)
Fasting blood glucose readings are consistently in the 200s despite taking Rybelsus daily. Patient has a history of good control on Jardiance, but it was discontinued due to concerns about hypotension. -Check A1C

## 2023-07-18 NOTE — Assessment & Plan Note (Addendum)
Severe, intermittent flank pain with a history of kidney stones. Urinalysis is normal, possibly due to recent initiation of antibiotics for suspected UTI. No dysuria. Have urinary frequency. -Order CT scan to evaluate for kidney stones. -Continue current pain management with Ibuprofen 800mg  and Tylenol as needed.

## 2023-07-18 NOTE — Progress Notes (Signed)
Pt had bilateral punctate nonobstructing nephrolithiasis on CT renal .   Symptoms include flank pain, difficulty finding relief with standard analgesics.  Patient has been experiencing 9/10 flank pain. Will treat with oxycodone-acetaminophen 5-325 mg -5 tablets . Encourage hydration.  I would prefer not to start the patient on Flomax at this time due to the history of intermittent hypotension.  Flomax can potentially lower the blood pressure.

## 2023-07-19 ENCOUNTER — Emergency Department: Payer: 59

## 2023-07-19 ENCOUNTER — Emergency Department
Admission: EM | Admit: 2023-07-19 | Discharge: 2023-07-19 | Disposition: A | Discharge status: Home / Self Care | Payer: 59 | Attending: Emergency Medicine | Admitting: Emergency Medicine

## 2023-07-19 DIAGNOSIS — R0789 Other chest pain: Secondary | ICD-10-CM | POA: Insufficient documentation

## 2023-07-19 DIAGNOSIS — D649 Anemia, unspecified: Secondary | ICD-10-CM | POA: Insufficient documentation

## 2023-07-19 DIAGNOSIS — Z7901 Long term (current) use of anticoagulants: Secondary | ICD-10-CM | POA: Insufficient documentation

## 2023-07-19 DIAGNOSIS — R519 Headache, unspecified: Secondary | ICD-10-CM | POA: Insufficient documentation

## 2023-07-19 DIAGNOSIS — E119 Type 2 diabetes mellitus without complications: Secondary | ICD-10-CM | POA: Insufficient documentation

## 2023-07-19 DIAGNOSIS — I10 Essential (primary) hypertension: Secondary | ICD-10-CM | POA: Diagnosis not present

## 2023-07-19 DIAGNOSIS — R079 Chest pain, unspecified: Secondary | ICD-10-CM

## 2023-07-19 LAB — URINE CULTURE
MICRO NUMBER:: 16025873
Result:: NO GROWTH
SPECIMEN QUALITY:: ADEQUATE

## 2023-07-19 LAB — BASIC METABOLIC PANEL
Anion gap: 14 (ref 5–15)
BUN: 22 mg/dL — ABNORMAL HIGH (ref 6–20)
CO2: 23 mmol/L (ref 22–32)
Calcium: 9.1 mg/dL (ref 8.9–10.3)
Chloride: 101 mmol/L (ref 98–111)
Creatinine, Ser: 0.88 mg/dL (ref 0.44–1.00)
GFR, Estimated: >60 mL/min (ref >60)
Glucose, Bld: 241 mg/dL — ABNORMAL HIGH (ref 70–99)
Potassium: 3.7 mmol/L (ref 3.5–5.1)
Sodium: 138 mmol/L (ref 135–145)

## 2023-07-19 LAB — CBC
HCT: 35.6 % — ABNORMAL LOW (ref 36.0–46.0)
Hemoglobin: 11.2 g/dL — ABNORMAL LOW (ref 12.0–15.0)
MCH: 28.6 pg (ref 26.0–34.0)
MCHC: 31.5 g/dL (ref 30.0–36.0)
MCV: 90.8 fL (ref 80.0–100.0)
Platelets: 228 10*3/uL (ref 150–400)
RBC: 3.92 MIL/uL (ref 3.87–5.11)
RDW: 13.1 % (ref 11.5–15.5)
WBC: 9.3 10*3/uL (ref 4.0–10.5)
nRBC: 0 % (ref 0.0–0.2)

## 2023-07-19 LAB — HEPATIC FUNCTION PANEL
ALT: 13 U/L (ref 0–44)
AST: 13 U/L — ABNORMAL LOW (ref 15–41)
Albumin: 3.5 g/dL (ref 3.5–5.0)
Alkaline Phosphatase: 90 U/L (ref 38–126)
Bilirubin, Direct: <0.1 mg/dL (ref 0.0–0.2)
Total Bilirubin: 0.3 mg/dL (ref 0.0–1.2)
Total Protein: 6.1 g/dL — ABNORMAL LOW (ref 6.5–8.1)

## 2023-07-19 LAB — TROPONIN I (HIGH SENSITIVITY)
Troponin I (High Sensitivity): 4 ng/L (ref <18)
Troponin I (High Sensitivity): 3 ng/L (ref <18)

## 2023-07-19 LAB — D-DIMER, QUANTITATIVE: D-Dimer, Quant: <0.27 ug{FEU}/mL (ref 0.00–0.50)

## 2023-07-19 MED ORDER — SODIUM CHLORIDE 0.9 % IV BOLUS
1000.0000 mL | Freq: Once | INTRAVENOUS | Status: AC
  Administered 2023-07-19: 1000 mL via INTRAVENOUS

## 2023-07-19 MED ORDER — ACETAMINOPHEN 500 MG PO TABS
1000.0000 mg | ORAL_TABLET | Freq: Once | ORAL | Status: AC
  Administered 2023-07-19: 1000 mg via ORAL
  Filled 2023-07-19: qty 2

## 2023-07-19 NOTE — ED Provider Notes (Signed)
[facility] Provider Note    Event Date/Time   First MD Initiated Contact with Patient 07/19/23 [phone]     (approximate)   History   Chest Pain   HPI  Andrea [name] is a 43 year old female with history of migraines, HTN, T2DM presenting to the emergency department for evaluation of chest pain.  Patient reports that she felt okay when she went to bed on Friday night, but around 130 she woke up and noticed some chest pain with associated shortness of breath.  She additionally reports a headache at that time.  Has history of migraines, but reports that this was different in character.  She called her husband over and did have a witnessed a couple episode.  Mild ongoing headache, but symptoms overall improved at the time of my evaluation.  History of similar episodes in the past.  Reports she is on Eliquis for prophylaxis from a right ankle surgery in November.    Physical Exam   Triage Vital Signs: ED Triage Vitals  Encounter Vitals Group     BP 07/19/23 0211 116/72     Systolic BP Percentile --      Diastolic BP Percentile --      Pulse Rate 07/19/23 0211 79     Resp 07/19/23 0211 18     Temp 07/19/23 0216 98.1 F (36.7 C)     Temp Source 07/19/23 0216 Oral     SpO2 07/19/23 0211 95 %     Weight 07/19/23 0216 290 lb (131.5 kg)     Height 07/19/23 0216 5\' 11"  (1.803 m)     Head Circumference --      Peak Flow --      Pain Score 07/19/23 0445 9     Pain Loc --      Pain Education --      Exclude from Growth Chart --     Most recent vital signs: Vitals:   07/19/23 0700 07/19/23 0701  BP: 107/65   Pulse: 66   Resp: 11   Temp:  (!) 96.3 F (35.7 C)  SpO2: 97%      General: Awake, interactive  CV:  Regular rate, good peripheral perfusion.  Resp:  Unlabored respirations, lungs clear to auscultation Abd:  Nondistended, soft, nontender to palpation Neuro:  Symmetric facial movement, fluid speech, 5-5 strength in bilateral upper and lower  extremities   ED Results / Procedures / Treatments   Labs (all labs ordered are listed, but only abnormal results are displayed) Labs Reviewed  BASIC METABOLIC PANEL - Abnormal; Notable for the following components:      Result Value   Glucose, Bld 241 (*)    BUN 22 (*)    All other components within normal limits  CBC - Abnormal; Notable for the following components:   Hemoglobin 11.2 (*)    HCT 35.6 (*)    All other components within normal limits  HEPATIC FUNCTION PANEL - Abnormal; Notable for the following components:   Total Protein 6.1 (*)    AST 13 (*)    All other components within normal limits  D-DIMER, QUANTITATIVE  TROPONIN I (HIGH SENSITIVITY)  TROPONIN I (HIGH SENSITIVITY)     EKG EKG independently reviewed interpreted by myself (ER attending) demonstrates:  EKG demonstrates normal sinus rhythm rate of 82, PR 174, QRS 82, QTc 441, no acute ST changes  RADIOLOGY Imaging independently reviewed and interpreted by myself demonstrates:  CXR without focal consolidation CT head without  acute bleed or other acute finding  PROCEDURES:  Critical Care performed: No  Procedures   MEDICATIONS ORDERED IN ED: Medications  sodium chloride 0.9 % bolus 1,000 mL (0 mLs Intravenous Stopped 07/19/23 0701)  acetaminophen (TYLENOL) tablet 1,000 mg (1,000 mg Oral Given 07/19/23 0453)     IMPRESSION / MDM / ASSESSMENT AND PLAN / ED COURSE  I reviewed the triage vital signs and the nursing notes.  Differential diagnosis includes, but is not limited to, ACS, pneumonia, pneumothorax, PE, SAH, benign headache  Patient's presentation is most consistent with acute presentation with potential threat to life or bodily function.  43 year old female presenting with chest pain and headache.  Stable vitals.  Labs with stable anemia, otherwise reassuring.  Negative D-dimer.  Troponin x 2 negative.  EKG without acute ischemic findings.  Chest x-Kabella Cassidy without acute abnormality and head CT  reassuring.  Patient given IV fluids and Tylenol.  Does report some improvement on reevaluation.  Overall low suspicion for significant acute pathology.  Patient is comfortable with discharge.  Strict return precautions provided.  Patient discharged stable condition.      FINAL CLINICAL IMPRESSION(S) / ED DIAGNOSES   Final diagnoses:  Nonspecific chest pain  Acute nonintractable headache, unspecified headache type     Rx / DC Orders   ED Discharge Orders     None        Note:  This document was prepared using Dragon voice recognition software and may include unintentional dictation errors.   [name], MD 07/19/23 [phone]

## 2023-07-19 NOTE — ED Notes (Signed)
Pt arrives w/ c/o CP starting this morning. Reproducible, non radiating, located underneath breast. Also c/o HA. Denies N/V.

## 2023-07-19 NOTE — ED Triage Notes (Signed)
Patient C/O pain under bilateral breasts that began around 2130 yesterday. Patient states she went to bed around 2330 and woke up about an hour later with mid-sternal chest pain and SOB. Patient was given 324 of aspirin by EMS. Patient is on eliquis for prophylaxis from a surgery on her right ankle she had on November 25th. Denies any cardiac history.

## 2023-07-19 NOTE — Discharge Instructions (Signed)
You were seen in the ER today for evaluation of your headache and chest pain.  Your testing was fortunately reassuring against an emergency cause for this.  Please follow-up with your primary care doctor for further evaluation.  Return to the ER for new or worsening symptoms.

## 2023-07-20 ENCOUNTER — Telehealth: Payer: 59 | Admitting: Family Medicine

## 2023-07-20 ENCOUNTER — Encounter: Payer: Self-pay | Admitting: Neurology

## 2023-07-20 ENCOUNTER — Encounter: Payer: Self-pay | Admitting: Nurse Practitioner

## 2023-07-20 DIAGNOSIS — H6690 Otitis media, unspecified, unspecified ear: Secondary | ICD-10-CM

## 2023-07-20 MED ORDER — AMOXICILLIN 875 MG PO TABS: 875.0000 mg | ORAL_TABLET | Freq: Two times a day (BID) | ORAL | 0 refills | Status: DC

## 2023-07-20 NOTE — Progress Notes (Signed)
 E-Visit for Ear Pain - Acute Otitis Media   We are sorry that you are not feeling well. Here is how we plan to help!  Based on what you have shared with me it looks like you have Acute Otitis Media.  Acute Otitis Media is an infection of the middle or "inner" ear. This type of infection can cause redness, inflammation, and fluid buildup behind the tympanic membrane (ear drum).  The usual symptoms include: Earache/Pain Fever Upper respiratory symptoms Lack of energy/Fatigue/Malaise Slight hearing loss gradually worsening- if the inner ear fills with fluid What causes middle ear infections? Most middle ear infections occur when an infection such as a cold, leads to a build-up of mucus in the middle ear and causes the Eustachian tube (a thin tube that runs from the middle ear to the back of the nose) to become swollen or blocked.   This means mucus can't drain away properly, making it easier for an infection to spread into the middle ear.  How middle ear infections are treated: Most ear infections clear up within three to five days and don't need any specific treatment. If necessary, tylenol or ibuprofen should be used to relieve pain and a high temperature.  If you develop a fever higher than 102, or any significantly worsening symptoms, this could indicate a more serious infection moving to the middle/inner and needs face to face evaluation in an office by a provider.   Antibiotics aren't routinely used to treat middle ear infections, although they may occasionally be prescribed if symptoms persist or are particularly severe. Given your presentation,   I have prescribed Amoxicillin 875 mg one tablet twice daily for 10 days   Your symptoms should improve over the next 3 days and should resolve in about 7 days. Be sure to complete ALL of the prescription(s) given.  HOME CARE: Wash your hands frequently. If you are prescribed an ear drop, do not place the tip of the bottle on your ear or  touch it with your fingers. You can take Acetaminophen 650 mg every 4-6 hours as needed for pain.  If pain is severe or moderate, you can apply a heating pad (set on low) or hot water bottle (wrapped in a towel) to outer ear for 20 minutes.  This will also increase drainage.  GET HELP RIGHT AWAY IF: Fever is over 102.2 degrees. You develop progressive ear pain or hearing loss. Ear symptoms persist longer than 3 days after treatment.  MAKE SURE YOU: Understand these instructions. Will watch your condition. Will get help right away if you are not doing well or get worse.  Thank you for choosing an e-visit.  Your e-visit answers were reviewed by a board certified advanced clinical practitioner to complete your personal care plan. Depending upon the condition, your plan could have included both over the counter or prescription medications.  Please review your pharmacy choice. Make sure the pharmacy is open so you can pick up the prescription now. If there is a problem, you may contact your provider through [employer] and have the prescription routed to another pharmacy.  Your safety is important to [place]. If you have drug allergies check your prescription carefully.   For the next 24 hours you can use MyChart to ask questions about today's visit, request a non-urgent call back, or ask for a work or school excuse. You will get an email with a survey after your eVisit asking about your experience. We would appreciate your feedback. I hope  that your e-visit has been valuable and will aid in your recovery.

## 2023-07-21 ENCOUNTER — Telehealth (HOSPITAL_COMMUNITY): Payer: Self-pay | Admitting: Professional

## 2023-07-21 ENCOUNTER — Ambulatory Visit (INDEPENDENT_AMBULATORY_CARE_PROVIDER_SITE_OTHER): Payer: 59

## 2023-07-21 ENCOUNTER — Telehealth: Payer: Self-pay

## 2023-07-21 ENCOUNTER — Other Ambulatory Visit (HOSPITAL_COMMUNITY): Payer: Self-pay

## 2023-07-21 ENCOUNTER — Telehealth (INDEPENDENT_AMBULATORY_CARE_PROVIDER_SITE_OTHER): Payer: 59 | Admitting: Psychiatry

## 2023-07-21 ENCOUNTER — Encounter (HOSPITAL_COMMUNITY): Payer: Self-pay | Admitting: Psychiatry

## 2023-07-21 ENCOUNTER — Ambulatory Visit: Payer: 59 | Attending: Podiatry

## 2023-07-21 DIAGNOSIS — G4709 Other insomnia: Secondary | ICD-10-CM

## 2023-07-21 DIAGNOSIS — M25571 Pain in right ankle and joints of right foot: Secondary | ICD-10-CM | POA: Insufficient documentation

## 2023-07-21 DIAGNOSIS — F332 Major depressive disorder, recurrent severe without psychotic features: Secondary | ICD-10-CM

## 2023-07-21 DIAGNOSIS — F445 Conversion disorder with seizures or convulsions: Secondary | ICD-10-CM

## 2023-07-21 DIAGNOSIS — R262 Difficulty in walking, not elsewhere classified: Secondary | ICD-10-CM | POA: Diagnosis present

## 2023-07-21 DIAGNOSIS — E538 Deficiency of other specified B group vitamins: Secondary | ICD-10-CM | POA: Diagnosis not present

## 2023-07-21 DIAGNOSIS — Z79899 Other long term (current) drug therapy: Secondary | ICD-10-CM

## 2023-07-21 DIAGNOSIS — F411 Generalized anxiety disorder: Secondary | ICD-10-CM

## 2023-07-21 DIAGNOSIS — F410 Panic disorder [episodic paroxysmal anxiety] without agoraphobia: Secondary | ICD-10-CM

## 2023-07-21 DIAGNOSIS — I951 Orthostatic hypotension: Secondary | ICD-10-CM

## 2023-07-21 DIAGNOSIS — F4310 Post-traumatic stress disorder, unspecified: Secondary | ICD-10-CM | POA: Diagnosis not present

## 2023-07-21 MED ORDER — CYANOCOBALAMIN 1000 MCG/ML IJ SOLN
1000.0000 ug | Freq: Once | INTRAMUSCULAR | Status: AC
  Administered 2023-07-21: 1000 ug via INTRAMUSCULAR

## 2023-07-21 NOTE — Telephone Encounter (Signed)
Pharmacy Patient Advocate Encounter  Received notification from [facility] that Prior Authorization for QULIPTA 30MG  has been APPROVED from 2.3.25 to 2.2.26. Ran test claim, Copay is $0. This test claim was processed through [facility]- copay amounts may vary at other pharmacies due to pharmacy/plan contracts, or as the patient moves through the different stages of their insurance plan.   PA #/Case ID/Reference #:  [id number]

## 2023-07-21 NOTE — Progress Notes (Signed)
 Pt presented for their vitamin B12 injection. Pt was identified through two identifiers. Pt tolerated shot well in their right deltoid.

## 2023-07-21 NOTE — Telephone Encounter (Signed)
PA request has been Submitted. New Encounter created for follow up. For additional info see Pharmacy Prior Auth telephone encounter from 02/03.

## 2023-07-21 NOTE — Progress Notes (Signed)
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 application and verified that I am speaking with the correct person using two identifiers.  Location: Patient: at home in [facility] Provider: home office   I discussed the limitations of evaluation and management by telemedicine and the availability of in person appointments. The patient expressed understanding and agreed to  proceed.  I discussed the assessment and treatment plan with the patient. The patient was provided an opportunity to ask questions and all were answered. The patient agreed with the plan and demonstrated an understanding of the instructions.   The patient was advised to call back or seek an in-person evaluation if the symptoms worsen or if the condition fails to improve as anticipated.  I provided 30 minutes of virtual face-to-face time during this encounter including referral for intensive therapy.  Elsie Lincoln, MD 07/21/2023, 11:33 AM

## 2023-07-21 NOTE — Patient Instructions (Signed)
We did not make any changes today.  I placed a referral for the intensive outpatient program so be on the lookout for a phone call from them.  I would also encourage you to touch base with your primary care provider and ask if hospitalization would be warranted to more closely monitor your blood pressure and rule out other causes for the syncopal episodes.  She

## 2023-07-21 NOTE — Telephone Encounter (Signed)
*  [facility]  Pharmacy Patient Advocate Encounter   Received notification from Patient Advice Request messages that prior authorization for Qulipta 30MG  tablets  is required/requested.   Insurance verification completed.   The patient is insured through [employer] [facility] .   Per test claim: PA required; PA submitted to above mentioned insurance via CoverMyMeds Key/confirmation #/EOC [id number] Status is pending

## 2023-07-21 NOTE — Therapy (Signed)
OUTPATIENT PHYSICAL THERAPY TREATMENT   Patient Name: Andrea [name] MRN: [id number] DOB:Jan 20, 1981, 43 y.o., female Today's Date: 07/21/2023  END OF SESSION:  PT End of Session - 07/21/23 0906     Visit Number 3    Number of Visits 25    Date for PT Re-Evaluation 09/12/23    PT Start Time 0906    PT Stop Time 0945    PT Time Calculation (min) 39 min    Activity Tolerance Patient tolerated treatment well    Behavior During Therapy [facility] for tasks assessed/performed               Past Medical History:  Diagnosis Date   Anxiety    Asthma    Bipolar 1 disorder (HCC)    Bipolar 1 disorder, mixed, severe (HCC) 12/20/2016   Bipolar I disorder, most recent episode depressed (HCC) 12/20/2016   Complication of anesthesia    hard time waking up    Depression    Diabetes mellitus without complication (HCC)    Drug induced akathisia 04/02/2022   GERD (gastroesophageal reflux disease)    no meds   Hypertension    Kidney stones    Long term current use of antipsychotic medication 03/13/2022   Low back pain    Migraines    PCOS (polycystic ovarian syndrome)    PONV (postoperative nausea and vomiting)    Schizophrenia (HCC)    Seizure (HCC) 10/03/2021   Seizures (HCC) 06/04/2016   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Marilynne Drivers 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   Shortness of breath dyspnea    with bronchitis   Past Surgical History:  Procedure Laterality Date   ABDOMINAL HYSTERECTOMY N/A 11/14/2015   Procedure: HYSTERECTOMY ABDOMINAL;  Surgeon: Levi Aland, MD;  Location: [facility];  Service: Gynecology;  Laterality: N/A;   ANKLE FUSION Right 05/13/2023   Procedure: ARTHRODESIS ANKLE;  Surgeon: Pilar Plate, DPM;  Location: [facility];  Service: Orthopedics/Podiatry;  Laterality: Right;  Hardware removal, right ankle tibiot talo calcaneal arthrodesis   CHOLECYSTECTOMY     INTRAUTERINE DEVICE (IUD) INSERTION  06/14/2014   [facility]   LYMPH NODES REMOVED     ORIF ANKLE FRACTURE  Right 05/03/2023   Procedure: OPEN REDUCTION INTERNAL FIXATION (ORIF) ANKLE FRACTURE;  Surgeon: Edwin Cap, DPM;  Location: [facility];  Service: Orthopedics/Podiatry;  Laterality: Right;   ovarian cyst removed     SALPINGOOPHORECTOMY Bilateral 11/14/2015   Procedure: SALPINGO OOPHORECTOMY;  Surgeon: Levi Aland, MD;  Location: [facility];  Service: Gynecology;  Laterality: Bilateral;   Patient Active Problem List   Diagnosis Date Noted   Acute right-sided low back pain without sciatica 07/18/2023   Acute right flank pain 07/18/2023   Fluctuating blood pressure 07/18/2023   Hypotension 07/02/2023   Occasional tremors 07/02/2023   Constipation 07/02/2023   Diabetes mellitus treated with oral medication (HCC) 07/02/2023   Syncope due to orthostatic hypotension 06/20/2023   Obesity (BMI 30-39.9) 05/14/2023   Acquired varus deformity of right ankle 05/13/2023   Postoperative surgical complication involving musculoskeletal system associated with musculoskeletal procedure 05/12/2023   Closed right ankle fracture 05/03/2023   Bipolar 1 disorder (HCC) 05/03/2023   Type 2 diabetes mellitus with peripheral neuropathy (HCC) 05/03/2023   Essential hypertension 05/03/2023   Mild persistent asthma 05/03/2023   Closed trimalleolar fracture of right ankle 05/03/2023   Pre-syncope 05/03/2023   Chronic pain 07/16/2022   PTSD (post-traumatic stress disorder) 03/13/2022   Generalized anxiety disorder with panic attacks 03/13/2022  Major depressive disorder, 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 03/13/2022   Other insomnia 03/13/2022   Long term current use of antipsychotic medication 03/13/2022   Polypharmacy 03/13/2022   Convulsion (HCC) 11/26/2021   Irregular periods 09/26/2020   Mild persistent asthma with acute exacerbation 06/18/2017   Uncontrolled type 2 diabetes mellitus with hyperglycemia, with  long-term current use of insulin (HCC) 09/18/2016   HTN (hypertension) 09/18/2016   Pelvic pain in female 11/14/2015   Acute pyelonephritis 01/16/2013   Migraines 10/30/2012   Obesity, Class III, BMI 40-49.9 (morbid obesity) (HCC) 10/30/2012   Kidney stones     PCP: Golden Pop, FNP   REFERRING PROVIDER: Pilar Plate, DPM   REFERRING DIAG: [phone] (ICD-10-CM) - Status post ORIF of fracture of ankle  THERAPY DIAG:  Pain in right ankle and joints of right foot  Difficulty in walking, not elsewhere classified  Rationale for Evaluation and Treatment: Rehabilitation  ONSET DATE: S/P 05/13/2023  SUBJECTIVE:   SUBJECTIVE STATEMENT: R ankle is causing a bit of pain and its swelling. Sees Dr. Annamary Rummage tomorrow. 6/10 R ankle pain currently, I'm ok.     PERTINENT HISTORY: S/P R ankle Tibiotalarcalcaneal arthrodesis on 05/13/2023. Pt is currently 5 weeks post op. Has not yet had PT for her procedure. This is a revision to her 05/03/2023 surgery. Pt went to get a box, got dizzy and fell, and broke her ankle. R ankle was crushing even more during her follow up appointment for her initial surgery leading to her current procedure. Pt currently non-weight bearing for 6-8 weeks.    Husband present  No latex allergies Blood pressure drops really low.  Last seizure was November 2023. Signs of seizure include feeling like she is going to black out.    PAIN:  Are you having pain? Yes: NPRS scale: 5/10 Pain location: R ankle Pain description: throbbing Aggravating factors: movement such as scooting around with the knee scooter Relieving factors: ice and heat.   PRECAUTIONS: Blood pressure drops really low.   RED FLAGS: Bowel or bladder incontinence: No and Cauda equina syndrome: No   WEIGHT BEARING RESTRICTIONS: Yes NWB for 6-8 weeks following surgery.   FALLS:  Has patient fallen in last 6 months? Yes. Number of falls 2  Low potassium caused the first  fall, the low blood pressure caused to second fall  LIVING ENVIRONMENT: Lives with: lives with their spouse Lives in: House/apartment Stairs: Yes: External: 38 steps; on right going up, on left going up, and can reach both Has following equipment at home: Dan Humphreys - 2 wheeled and Crutches and a knee scooter  OCCUPATION: Environmental health practitioner, desk work. 45 minutes of sitting prior to standing break.   PLOF: Independent  PATIENT GOALS: Just want to be able to get on her feet again.   NEXT MD VISIT: February11, 2025  OBJECTIVE:  Note: Objective measures were completed at Evaluation unless otherwise noted.  DIAGNOSTIC FINDINGS:   DG Ankle Complete Right 05/13/2023  Narrative & Impression  CLINICAL DATA:  Postop.   EXAM: RIGHT TIBIA AND FIBULA - 2 VIEW; RIGHT ANKLE - COMPLETE 3+ VIEW; RIGHT OS CALCIS - 2+ VIEW   COMPARISON:  Preoperative imaging.   FINDINGS: Interval distal fibular resection. Intramedullary nail with proximal and distal locking screw traversing the tibia, talus, and calcaneus. Interval medial tibial resection. Posterior splint material in place. Multifocal skin staples.   IMPRESSION: Interval distal fibular resection and medial  tibial resection. Ankle arthrodesis with intramedullary nail with traversing the tibia, talus, and calcaneus.     Electronically Signed   By: Narda Rutherford M.D.   On: 05/13/2023 17:41   PATIENT SURVEYS:  FOTO R ankle FOTO 11 (06/17/2023)  COGNITION: Overall cognitive status: Within functional limits for tasks assessed     SENSATION: Not tested  EDEMA:  Pt R ankle wrapped for swelling, unable to assess surgical incisions. Pt pt and husband, pt has an infection at her R lateral ankle incision and was given antibiotics about 1-2 weeks ago.      LOWER EXTREMITY MMT:  MMT Right eval Left eval  Hip flexion 4 4  Hip extension 3+ 4-  Hip abduction 4 4  Hip adduction    Hip internal rotation    Hip external  rotation    Knee flexion 5 4+  Knee extension 5 5  Ankle dorsiflexion    Ankle plantarflexion    Ankle inversion    Ankle eversion     (Blank rows = not tested)  LOWER EXTREMITY SPECIAL TESTS:    FUNCTIONAL TESTS:    GAIT: Distance walked: n/a Assistive device utilized:  Knee scooter for R LE Level of assistance: Modified independence Comments: NWB R LE with CAM boot                                                                                                                                TREATMENT DATE: 07/21/2023   Therapeutic Exercise  With CAM boot on  Seated LAQ R 10x5 seconds   Then with 2 lbs ankle weight 10x5 seconds for 3 sets  Seated hip ER with 2 lbs ankle weight 5x5 seconds for 2 sets  Supine hip extension isometrics 10x5 seconds for 3 sets  Supine SLR hip flexion  R 5x3  Without CAM boot Supine toe flexion and extension 10x3    Toe spreads and adduction 10x3  S/L hip abduction                R 10x, then 5x               L 10x   Cues to maintain NWB when performing sit to stand and stand pivot transfers secondary to pt observed to place weight onto R LE with CAM boot.     Improved exercise technique, movement at target joints, use of target muscles after mod verbal, visual, tactile cues.        PATIENT EDUCATION:  Education details: there-ex, HEP, POC Person educated: Patient and Spouse Education method: Explanation, Demonstration, Tactile cues, Verbal cues, and Handouts Education comprehension: verbalized understanding and returned demonstration  HOME EXERCISE PROGRAM: Access Code: [email or web address] URL: [email or web address] Date: 06/17/2023 Prepared by: Loralyn Freshwater  Exercises - Sidelying Hip Abduction  - 1 x daily - 7 x weekly - 3 sets - 5 reps - Seated Hamstring Curl with Anchored Resistance  - 1 x daily -  7 x weekly - 3 sets - 10 reps - Seated Hip Adduction Isometrics with Ball  - 1 x daily - 7 x weekly - 3 sets - 10  reps - 5 seconds hold    ASSESSMENT:  CLINICAL IMPRESSION:   Continued working on R hip flexion, abduction, extension, R quadriceps extension strengthening as well as activating intrinsic foot muscles in NWB to promote ability to ambulate when appropriate. Good muscle use observed during exercises. Pt tolerated session well without aggravation of symptoms. Pt will benefit from continued skilled physical therapy services to improve strength and function.       OBJECTIVE IMPAIRMENTS: Abnormal gait, decreased balance, decreased endurance, decreased mobility, difficulty walking, decreased ROM, decreased strength, improper body mechanics, postural dysfunction, and pain.   ACTIVITY LIMITATIONS: carrying, lifting, standing, squatting, stairs, transfers, and locomotion level  PARTICIPATION LIMITATIONS:   PERSONAL FACTORS: Fitness and 3+ comorbidities: anxiety, bipolar disorder, DM, LBP, schizophrenia, seizures  are also affecting patient's functional outcome.   REHAB POTENTIAL: Fair    CLINICAL DECISION MAKING: Stable/uncomplicated  EVALUATION COMPLEXITY: Low   GOALS: Goals reviewed with patient? Yes  SHORT TERM GOALS: Target date: 06/26/2022 Pt will be independent with her initial HEP to improve strength, function, and ability to ambulate with less difficulty.  Baseline: Pt has started her initial HEP (06/17/2023); no questions with HEP (07/21/2023) Goal status: Met    LONG TERM GOALS: Target date: 09/12/2023  Pt will improve her R ankle FOTO score by at least 10 points as a demonstration of improved function.  Baseline: R ankle FOTO 11 (06/17/2023) Goal status: INITIAL  2.  Pt will be able to ambulate at least 500 ft without CAM boot and without AD and no LOB to promote mobility.  Baseline: Pt currently wearing CAM boot and NWB R ankle, uses a knee scooter (06/17/2023) Goal status: INITIAL  3.  Pt will improve B hip abduction and extension strength by at least 1 MMT grade to  promote ability to ambulate and perform standing tasks with less difficulty.  Baseline:  MMT Right eval Left eval  Hip extension 3+ 4-  Hip abduction 4 4   (06/17/2023)  Goal status: INITIAL    PLAN:  PT FREQUENCY: 2x/week  PT DURATION: 12 weeks  PLANNED INTERVENTIONS: 97110-Therapeutic exercises, 97530- Therapeutic activity, [id number]- Neuromuscular re-education, 97140- Manual therapy, [id number]- Gait training, 40981- Electrical stimulation (unattended), [phone]- Ionotophoresis 4mg /ml Dexamethasone, Patient/Family education, Balance training, and Stair training  PLAN FOR NEXT SESSION: hip and trunk strengthening, maintaining NWB until otherwise noted by MD, manual techniques, modalities PRN  Loralyn Freshwater PT, DPT  07/21/2023, 3:03 PM

## 2023-07-22 ENCOUNTER — Encounter: Payer: Self-pay | Admitting: Neurology

## 2023-07-22 ENCOUNTER — Ambulatory Visit (INDEPENDENT_AMBULATORY_CARE_PROVIDER_SITE_OTHER): Payer: 59

## 2023-07-22 ENCOUNTER — Ambulatory Visit (INDEPENDENT_AMBULATORY_CARE_PROVIDER_SITE_OTHER): Payer: Self-pay | Admitting: Podiatry

## 2023-07-22 DIAGNOSIS — Z8781 Personal history of (healed) traumatic fracture: Secondary | ICD-10-CM

## 2023-07-22 DIAGNOSIS — Z9889 Other specified postprocedural states: Secondary | ICD-10-CM

## 2023-07-22 DIAGNOSIS — M778 Other enthesopathies, not elsewhere classified: Secondary | ICD-10-CM | POA: Diagnosis not present

## 2023-07-22 NOTE — Progress Notes (Signed)
  Subjective:  Patient ID: Andrea [name], female    DOB: 11/27/1980,  MRN: [id number]  DOS: 05/13/2023 Procedure: Right ankle hardware removal and tibiotalar calcaneal arthrodesis  43 y.o. female returns for post-op check.   Patient is now approximately 2.5 months out from right ankle TTC arthrodesis.  Still reporting pain and swelling in the right ankle especially laterally which is bothering her. She is taking gabapentin 600 mg 3x daily.   Review of Systems: Negative except as noted in the HPI. Denies N/V/F/Ch.   Objective:  There were no vitals filed for this visit. There is no height or weight on file to calculate BMI. Constitutional Well developed. Well nourished.  Vascular Foot warm and well perfused. Capillary refill normal to all digits.  Calf is soft and supple, no posterior calf or knee pain, negative Homans' sign. Edema present within postoperative limits.  Neurologic Normal speech. Oriented to person, place, and time. Epicritic sensation to light touch grossly present bilaterally.  Dermatologic Surgical incisions healed well no evidence of dehiscence erythema drainage or other signs of infection  Orthopedic: Decreased edema though still with tenderness palpation especially laterally and inferiorly near the subtalar joint.    Radiographs: 07/22/2023 XR 3 views ankle right weightbearing with maintained alignment of the tibiotalar and subtalar joints.  Hindfoot nail intact without evidence of backing out or loss of compression at the ankle and subtalar joint.  Osseous healing noted about the tibiotalar joint.  Minimal to no osseous bridging seen across the subtalar joint at this time Assessment:   1. Post-operative state   2. Status post ORIF of fracture of ankle    Plan:  Patient was evaluated and treated and all questions answered.  S/p ankle surgery right to include right ankle hardware removal with tibiotalar Arthrodesis with Nail. -Progressing with some slow healing  especially at the subtalar joint though nail in good alignment without hardware complication - Continue transition to weightbearing in cam boot -Will prescribe patient Journavx for pain - Continue gabapentin 600 mg 3 times a day - Follow-up in 1 month for recheck with additional x-rays  -If no improvement in pain at that time we will consider CT scan versus attempting to obtain bone stimulator        Corinna Gab, DPM [facility] / [facility]                   07/22/2023

## 2023-07-23 ENCOUNTER — Other Ambulatory Visit (HOSPITAL_COMMUNITY): Payer: 59

## 2023-07-23 ENCOUNTER — Encounter: Payer: Self-pay | Admitting: Podiatry

## 2023-07-23 ENCOUNTER — Telehealth: Payer: Self-pay

## 2023-07-23 ENCOUNTER — Other Ambulatory Visit: Payer: Self-pay | Admitting: Podiatry

## 2023-07-23 DIAGNOSIS — E1121 Type 2 diabetes mellitus with diabetic nephropathy: Secondary | ICD-10-CM | POA: Insufficient documentation

## 2023-07-23 DIAGNOSIS — M25571 Pain in right ankle and joints of right foot: Secondary | ICD-10-CM | POA: Diagnosis not present

## 2023-07-23 MED ORDER — ELETRIPTAN HYDROBROMIDE 40 MG PO TABS: ORAL_TABLET | ORAL | 5 refills | Status: DC

## 2023-07-23 NOTE — Telephone Encounter (Signed)
Patient called and left a message. Andrea [name] is not available yet. She is asking for a substitute

## 2023-07-23 NOTE — Psych (Signed)
 Comprehensive Clinical Assessment (CCA) Note  07/23/2023 Andrea [name] [id number]  Chief Complaint:  Chief Complaint  Patient presents with   Depression   Anxiety   Follow-up    Referral by Dr. Barbra   Other    pSI   Visit Diagnosis: MDD; Anxiety; PTSD    CCA Screening, Triage and Referral (STR)  Patient Reported Information How did you hear about us ? Other (Comment)  Referral name: psychiatrist Dr. Barbra  Referral phone number: No data recorded  Whom do you see for routine medical problems? Primary Care  Practice/Facility Name: Dr. Vincente Doffing in [facility]  Practice/Facility Phone Number: No data recorded Name of Contact: No data recorded Contact Number: No data recorded Contact Fax Number: No data recorded Prescriber Name: No data recorded Prescriber Address (if known): No data recorded  What Is the Reason for Your Visit/Call Today? depression, anxiety, PTSD  How Long Has This Been Causing You Problems? 1-6 months  What Do You Feel Would Help You the Most Today? Treatment for Depression or other mood problem   Have You Recently Been in Any Inpatient Treatment (Hospital/Detox/Crisis Center/28-Day Program)? No  Name/Location of Program/Hospital:No data recorded How Long Were You There? No data recorded When Were You Discharged? No data recorded  Have You Ever Received Services From [facility] Before? Yes  Who Do You See at [facility]? No data recorded  Have You Recently Had Any Thoughts About Hurting Yourself? Yes (Not wanting to be here [place] anymore. Denies plan/intent)  Are You Planning to Commit Suicide/Harm Yourself At This time? No   Have you Recently Had Thoughts About Hurting Someone Andrea [name]? No  Explanation: No data recorded  Have You Used Any Alcohol  or Drugs in the Past 24 Hours? No  How Long Ago Did You Use Drugs or Alcohol ? No data recorded What Did You Use and How Much? No data recorded  Do You Currently Have a  Therapist/Psychiatrist? Yes  Name of Therapist/Psychiatrist: Dr. Barbra for psychiatry for 4-5 years; no individual therapy at this time   Have You Been Recently Discharged From Any Office Practice or Programs? No  Explanation of Discharge From Practice/Program: No data recorded    CCA Screening Triage Referral Assessment Type of Contact: Tele-Assessment  Is this Initial or Reassessment? Initial Assessment  Date Telepsych consult ordered in [facility]:  No data recorded Time Telepsych consult ordered in [facility]:  No data recorded  Patient Reported Information Reviewed? No data recorded Patient Left Without Being Seen? No data recorded Reason for Not Completing Assessment: No data recorded  Collateral Involvement: chart reivew   Does Patient Have a Court Appointed Legal Guardian? No data recorded Name and Contact of Legal Guardian: No data recorded If Minor and Not Living with Parent(s), Who has Custody? No data recorded Is CPS involved or ever been involved? Never  Is APS involved or ever been involved? Never   Patient Determined To Be At Risk for Harm To Self or Others Based on Review of Patient Reported Information or Presenting Complaint? No data recorded Method: No data recorded Availability of Means: No data recorded Intent: No data recorded Notification Required: No data recorded Additional Information for Danger to Others Potential: No data recorded Additional Comments for Danger to Others Potential: No data recorded Are There Guns or Other Weapons in Your Home? No  Types of Guns/Weapons: No data recorded Are These Weapons Safely Secured?  No data recorded Who Could Verify You Are Able To Have These Secured: No data recorded Do You Have any Outstanding Charges, Pending Court Dates, Parole/Probation? No data recorded Contacted To Inform of Risk of Harm To Self or Others: No data recorded  Location of Assessment: Other (comment)   Does Patient  Present under Involuntary Commitment? No  IVC Papers Initial File Date: No data recorded  [place] of Residence: [place]   Patient Currently Receiving the Following Services: Medication Management   Determination of Need: Urgent (48 hours)   Options For Referral: Partial Hospitalization     CCA Biopsychosocial Intake/Chief Complaint:  Andrea [name] referred by psychiatrist, Dr. Barbra. Stressors: 1) Health issues: Low blood pressure "where I black out"; "something is going on with my Kidneys and I need to see a specialist today at 1p."; broken right ankle that will not heal- broken in November 2024- has had 2 surgeries- non-weight barring. 2) MH: increased depression and anxiety 3) Financial: pt is currently out of work due to ankle. Treatment hx includes therapy on/off since before 2018, Dr. Barbra for 4-5 years since return to [facility] from [place]- PCP covered MH meds for 5 years while in [place]. [facility] stay in 2018 due to OD, reports 1 other [facility] stay for plan to OD but unsure of when. Chart review unhelpful to locate date. Reports suicide attempt via OD the 1x. Dx hx includes PTSD, Depression, Anxiety. Endorses pSI, denies intent/plan; denies HI/AVH/weapons/NSSIB. PF: family, "wonderful boyfriend" Andrea [name], faith. Family hx: mother's sister with schizophrenia; another mother's sister completed suicide. Supports include Andrea [name], Mom and Dad (physically distant due to living in [place]), good friends, cat "Summer." She lives with Andrea [name], her bf.  Current Symptoms/Problems: pSI; decreased ADLs due to MH and broken ankle; increased depression; increased anxiety; very overwhelmed; anxiety related to physical health; feelings of hopelessness; feelings of being a burden/stressor to everyone; appetite variable- weight gain of 20lbs after intentional weight loss of 50lbs; sleep is decreased- dealing with insomnia 1-2 hrs/night; fatigue; anhedonia; irritability; increased tearfulness; panic attacks- last night, lasting 30-45 minutes-  2-3x a day;   Patient Reported Schizophrenia/Schizoaffective Diagnosis in Past: No   Strengths: Hardworker, Good Friend to Others,  Preferences: to learn coping skills  Abilities: can attend and participate in treatment   Type of Services Patient Feels are Needed: PHP   Initial Clinical Notes/Concerns: No data recorded  Mental Health Symptoms Depression:  Change in energy/activity; Difficulty Concentrating; Fatigue; Weight gain/loss; Increase/decrease in appetite; Sleep (too much or little); Irritability; Hopelessness; Tearfulness   Duration of Depressive symptoms: Greater than two weeks   Mania:  None   Anxiety:   Tension; Worrying; Sleep; Restlessness; Irritability; Fatigue; Difficulty concentrating   Psychosis:  None   Duration of Psychotic symptoms: No data recorded  Trauma:  Avoids reminders of event; Re-experience of traumatic event; Irritability/anger; Hypervigilance; Difficulty staying/falling asleep; Guilt/shame   Obsessions:  None   Compulsions:  None   Inattention:  N/A   Hyperactivity/Impulsivity:  None   Oppositional/Defiant Behaviors:  None   Emotional Irregularity:  None   Other Mood/Personality Symptoms:  NA    Mental Status Exam Appearance and self-care  Stature:  Tall   Weight:  Obese   Clothing:  Casual   Grooming:  Normal   Cosmetic use:  None   Posture/gait:  Normal   Motor activity:  Not Remarkable   Sensorium  Attention:  Normal   Concentration:  Anxiety interferes   Orientation:  X5   Recall/memory:  Normal  Affect and Mood  Affect:  Depressed; Anxious   Mood:  Anxious; Depressed   Relating  Eye contact:  Normal   Facial expression:  Responsive; Anxious; Depressed   Attitude toward examiner:  Cooperative   Thought and Language  Speech flow: Normal   Thought content:  Appropriate to Mood and Circumstances   Preoccupation:  None   Hallucinations:  None   Organization:  No data recorded  [place] of Knowledge:  Average   Intelligence:  Average   Abstraction:  Normal   Judgement:  Good   Reality Testing:  Realistic   Insight:  Fair   Decision Making:  Normal   Social Functioning  Social Maturity:  Responsible   Social Judgement:  Normal   Stress  Stressors:  Grief/losses; Illness; Financial; Transitions   Coping Ability:  Normal   Skill Deficits:  Activities of daily living; Self-care   Supports:  Friends/Service system; Family     Religion: Religion/Spirituality Are You A Religious Person?: Yes What is Your Religious Affiliation?: Environmental Consultant: Leisure / Recreation Do You Have Hobbies?: Yes Leisure and Hobbies: watch TV; shopping before financial issues; reading; playing games on phone; likes to volunteer  Exercise/Diet: Exercise/Diet Do You Exercise?: No Have You Gained or Lost A Significant Amount of Weight in the Past Six Months?: Yes-Gained Number of Pounds Gained: 20 Do You Follow a Special Diet?: No Do You Have Any Trouble Sleeping?: Yes Explanation of Sleeping Difficulties: insomnia- 1/2 hours of sleep- gap- then 1/2 more hours of sleep   CCA Employment/Education Employment/Work Situation: Employment / Work Situation Employment Situation: Unemployed Patient's Job has Been Impacted by Current Illness: Yes Describe how Patient's Job has Been Impacted: she is not working due to ankle What is the Longest Time Patient has Held a Job?: 12 years 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 Has Patient ever Been in Equities Trader?: No  Education: Education Is Patient Currently Attending School?: No Last Grade Completed: 12 Name of High School: [employer] Did Garment/textile Technologist From [place]?: Yes Did Theme Park Manager?: Yes Did You Attend Graduate School?: No Did You Have Any Special Interests In School?: NA Did You Have An Individualized Education Program (IIEP):  No Did You Have Any Difficulty At School?: No Patient's Education Has Been Impacted by Current Illness: No   CCA Family/Childhood History Family and Relationship History: Family history Marital status: Long term relationship Long term relationship, how long?: 8 months What types of issues is patient dealing with in the relationship?: he is supportive Are you sexually active?: Yes What is your sexual orientation?: Heterosexual Has your sexual activity been affected by drugs, alcohol , medication, or emotional stress?: NA Does patient have children?: No  Childhood History:  Childhood History By whom was/is the patient raised?: Both parents Additional childhood history information: Grew up in a two parent home. My mom stayed at home with us  3 kids until my younger brother went to school. They supported us  in whatever we wanted to do. I wanted to be in the band so they went to every football game there was. When college came, they supported us  there. I went for a while and then I met my ex-husband and moved to [facility] with him. Description of patient's relationship with caregiver when they were a child: Great with both Patient's description of current relationship with people who raised him/her: Great with both How were you disciplined when you got  in trouble as a child/adolescent?: Parents just talked to her Does patient have siblings?: Yes Number of Siblings: 2 Description of patient's current relationship with siblings: older sister- good; little brother- good Did patient suffer any verbal/emotional/physical/sexual abuse as a child?: No Did patient suffer from severe childhood neglect?: No Has patient ever been sexually abused/assaulted/raped as an adolescent or adult?: No Was the patient ever a victim of a crime or a disaster?: No Witnessed domestic violence?: No Has patient been affected by domestic violence as an adult?: No  Child/Adolescent Assessment:     CCA Substance  Use Alcohol /Drug Use: Alcohol  / Drug Use Pain Medications: see MAR Prescriptions: MH: Duloxetine  60mg  qd; Seroquel  50mg  qd, 50mg  mid day, 100mg  qhs; Trazadone 150mg  qhs; NON MH: Airsupra rescue inhaler for asthma; Eliquis  2.5mg  bid for blood clots; Emgality  injection 1x every 30 days for migraines; Pepcid  40mg  bid for acid reflux; Gabapentin  600mg  tid for ankle neuropathy; Gemtesa 75mg  qd for bladder issues; Xyzal  5mg  qhs for allergies; Singular 10mg  qhs for asthma; Ibuprofen  800mg  tid for pain; Protonix  40mg  qd for acid reflux; Potassium Chloride  10mg  qd for low potassium; Qulipta  30mg  qd for migraine; Rybelsus  14mg  qd for diabetes; Symbicort inhaler 2 puffs bid for asthma; Vitamin 1.25mg  1 weekly; Zonegran  500mg  qhs for migraines Over the Counter: None History of alcohol  / drug use?: No history of alcohol  / drug abuse Longest period of sobriety (when/how long): NA                         ASAM's:  Six Dimensions of Multidimensional Assessment  Dimension 1:  Acute Intoxication and/or Withdrawal Potential:      Dimension 2:  Biomedical Conditions and Complications:      Dimension 3:  Emotional, Behavioral, or Cognitive Conditions and Complications:     Dimension 4:  Readiness to Change:     Dimension 5:  Relapse, Continued use, or Continued Problem Potential:     Dimension 6:  Recovery/Living Environment:     ASAM Severity Score:    ASAM Recommended Level of Treatment:     Substance use Disorder (SUD)    Recommendations for Services/Supports/Treatments: Recommendations for Services/Supports/Treatments Recommendations For Services/Supports/Treatments: Partial Hospitalization  DSM5 Diagnoses: Patient Active Problem List   Diagnosis Date Noted   Acute right-sided low back pain without sciatica 07/18/2023   Acute right flank pain 07/18/2023   Fluctuating blood pressure 07/18/2023   Hypotension 07/02/2023   Occasional tremors 07/02/2023   Constipation 07/02/2023   Diabetes  mellitus treated with oral medication (HCC) 07/02/2023   Syncope due to orthostatic hypotension 06/20/2023   Obesity (BMI 30-39.9) 05/14/2023   Acquired varus deformity of right ankle 05/13/2023   Postoperative surgical complication involving musculoskeletal system associated with musculoskeletal procedure 05/12/2023   Closed right ankle fracture 05/03/2023   Bipolar 1 disorder (HCC) 05/03/2023   Type 2 diabetes mellitus with peripheral neuropathy (HCC) 05/03/2023   Essential hypertension 05/03/2023   Mild persistent asthma 05/03/2023   Closed trimalleolar fracture of right ankle 05/03/2023   Pre-syncope 05/03/2023   Chronic pain 07/16/2022   PTSD (post-traumatic stress disorder) 03/13/2022   Generalized anxiety disorder with panic attacks 03/13/2022   Major depressive disorder, 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 03/13/2022   Other insomnia 03/13/2022   Long term current use of antipsychotic medication 03/13/2022   Polypharmacy 03/13/2022   Convulsion (HCC) 11/26/2021   Irregular periods 09/26/2020  Mild persistent asthma with acute exacerbation 06/18/2017   Uncontrolled type 2 diabetes mellitus with hyperglycemia, with long-term current use of insulin  (HCC) 09/18/2016   HTN (hypertension) 09/18/2016   Pelvic pain in female 11/14/2015   Acute pyelonephritis 01/16/2013   Migraines 10/30/2012   Obesity, Class III, BMI 40-49.9 (morbid obesity) (HCC) 10/30/2012   Kidney stones     Patient Centered Plan: Patient is on the following Treatment Plan(s):  Depression   Referrals to Alternative Service(s): Referred to Alternative Service(s):   Place:   Date:   Time:    Referred to Alternative Service(s):   Place:   Date:   Time:    Referred to Alternative Service(s):   Place:   Date:   Time:    Referred to Alternative Service(s):   Place:   Date:   Time:      Collaboration of Care:  Psychiatrist AEB referral from Dr. Barbra  Patient/Guardian was advised Release of Information must be obtained prior to any record release in order to collaborate their care with an outside provider. Patient/Guardian was advised if they have not already done so to contact the registration department to sign all necessary forms in order for us  to release information regarding their care.   Consent: Patient/Guardian gives verbal consent for treatment and assignment of benefits for services provided during this visit. Patient/Guardian expressed understanding and agreed to proceed.   Benton JINNY Devoid, [facility]

## 2023-07-24 ENCOUNTER — Ambulatory Visit: Payer: 59

## 2023-07-24 ENCOUNTER — Telehealth: Payer: Self-pay | Admitting: Podiatry

## 2023-07-24 ENCOUNTER — Other Ambulatory Visit: Payer: Self-pay | Admitting: Nurse Practitioner

## 2023-07-24 DIAGNOSIS — E1142 Type 2 diabetes mellitus with diabetic polyneuropathy: Secondary | ICD-10-CM

## 2023-07-24 DIAGNOSIS — E538 Deficiency of other specified B group vitamins: Secondary | ICD-10-CM

## 2023-07-24 NOTE — Telephone Encounter (Signed)
 Pt called and has been trying to get in touch with Dr Malvin thru my chart. I did apologize the provider is out of the office.  I need relief....the new pain medication you prescribed is not hit the market yet. I was so bummed.       I went and saw the Kidney Specialist today and he said no more Nsaids due to it effecting my kidneys.       I need something for pain to take the edge off. I'm willing to try anything!!

## 2023-07-24 NOTE — Telephone Encounter (Signed)
 Can you call this pt?

## 2023-07-25 NOTE — Telephone Encounter (Addendum)
 Pt called stating that the medication prescribed on 2/4 is not on the market yet and would like a different medication prescribed. Cannot be on ibuprofen  as it was affecting her kidneys previously.

## 2023-07-27 ENCOUNTER — Other Ambulatory Visit: Payer: Self-pay | Admitting: Nurse Practitioner

## 2023-07-28 ENCOUNTER — Telehealth (HOSPITAL_COMMUNITY): Payer: Self-pay | Admitting: Professional

## 2023-07-28 ENCOUNTER — Other Ambulatory Visit: Payer: Self-pay | Admitting: Podiatry

## 2023-07-28 ENCOUNTER — Other Ambulatory Visit: Payer: Self-pay

## 2023-07-28 ENCOUNTER — Telehealth: Payer: 59 | Admitting: Physician Assistant

## 2023-07-28 ENCOUNTER — Ambulatory Visit (INDEPENDENT_AMBULATORY_CARE_PROVIDER_SITE_OTHER): Payer: 59

## 2023-07-28 ENCOUNTER — Ambulatory Visit: Payer: 59

## 2023-07-28 DIAGNOSIS — M5442 Lumbago with sciatica, left side: Secondary | ICD-10-CM | POA: Diagnosis not present

## 2023-07-28 DIAGNOSIS — E538 Deficiency of other specified B group vitamins: Secondary | ICD-10-CM

## 2023-07-28 DIAGNOSIS — M5441 Lumbago with sciatica, right side: Secondary | ICD-10-CM | POA: Diagnosis not present

## 2023-07-28 MED ORDER — CYANOCOBALAMIN 1000 MCG/ML IJ SOLN
1000.0000 ug | Freq: Once | INTRAMUSCULAR | Status: AC
  Administered 2023-07-28: 1000 ug via INTRAMUSCULAR

## 2023-07-28 MED ORDER — FAMOTIDINE 40 MG PO TABS: 40.0000 mg | ORAL_TABLET | Freq: Two times a day (BID) | ORAL | 0 refills | Status: AC

## 2023-07-28 MED ORDER — CYCLOBENZAPRINE HCL 10 MG PO TABS: 5.0000 mg | ORAL_TABLET | Freq: Three times a day (TID) | ORAL | 0 refills | Status: DC | PRN

## 2023-07-28 MED ORDER — TRAMADOL HCL 50 MG PO TABS: 50.0000 mg | ORAL_TABLET | Freq: Three times a day (TID) | ORAL | 0 refills | Status: DC | PRN

## 2023-07-28 NOTE — Progress Notes (Signed)

## 2023-07-28 NOTE — Progress Notes (Signed)
Tramadol sent. 

## 2023-07-28 NOTE — Progress Notes (Signed)
After obtaining consent, and per orders of Dr. Walsh, injection of B12 given IM in left deltoid by Kela Baccari Lynn. Patient tolerated injection well.  

## 2023-07-29 ENCOUNTER — Encounter (HOSPITAL_COMMUNITY): Payer: Self-pay

## 2023-07-29 ENCOUNTER — Encounter: Payer: Self-pay | Admitting: Podiatry

## 2023-07-30 ENCOUNTER — Other Ambulatory Visit: Payer: Self-pay | Admitting: Nurse Practitioner

## 2023-07-30 ENCOUNTER — Other Ambulatory Visit: Payer: Self-pay | Admitting: Podiatry

## 2023-07-30 NOTE — Telephone Encounter (Signed)
Dr John Giovanni pt

## 2023-07-31 ENCOUNTER — Other Ambulatory Visit: Payer: Self-pay | Admitting: Podiatry

## 2023-07-31 ENCOUNTER — Ambulatory Visit: Payer: 59

## 2023-08-01 ENCOUNTER — Other Ambulatory Visit: Payer: Self-pay | Admitting: Podiatry

## 2023-08-01 MED ORDER — TRAMADOL HCL 50 MG PO TABS: 50.0000 mg | ORAL_TABLET | Freq: Three times a day (TID) | ORAL | 0 refills | Status: AC | PRN

## 2023-08-01 NOTE — Telephone Encounter (Signed)
Dr John Giovanni pt

## 2023-08-04 ENCOUNTER — Telehealth: Payer: Self-pay

## 2023-08-04 ENCOUNTER — Other Ambulatory Visit: Payer: Self-pay

## 2023-08-04 ENCOUNTER — Encounter: Payer: Self-pay | Admitting: Orthopedic Surgery

## 2023-08-04 ENCOUNTER — Telehealth (HOSPITAL_COMMUNITY): Payer: Self-pay | Admitting: Licensed Clinical Social Worker

## 2023-08-04 ENCOUNTER — Encounter: Payer: Self-pay | Admitting: Podiatry

## 2023-08-04 ENCOUNTER — Ambulatory Visit (INDEPENDENT_AMBULATORY_CARE_PROVIDER_SITE_OTHER): Payer: 59 | Admitting: Orthopedic Surgery

## 2023-08-04 VITALS — BP 122/80 | HR 96 | Ht 71.0 in | Wt 290.0 lb

## 2023-08-04 DIAGNOSIS — M1711 Unilateral primary osteoarthritis, right knee: Secondary | ICD-10-CM

## 2023-08-04 DIAGNOSIS — M25561 Pain in right knee: Secondary | ICD-10-CM | POA: Diagnosis not present

## 2023-08-04 DIAGNOSIS — M170 Bilateral primary osteoarthritis of knee: Secondary | ICD-10-CM

## 2023-08-04 DIAGNOSIS — E119 Type 2 diabetes mellitus without complications: Secondary | ICD-10-CM

## 2023-08-04 DIAGNOSIS — M25562 Pain in left knee: Secondary | ICD-10-CM | POA: Diagnosis not present

## 2023-08-04 DIAGNOSIS — M1712 Unilateral primary osteoarthritis, left knee: Secondary | ICD-10-CM

## 2023-08-04 MED ORDER — TRAZODONE HCL 150 MG PO TABS: 150.0000 mg | ORAL_TABLET | Freq: Every day | ORAL | 0 refills | Status: DC

## 2023-08-04 MED ORDER — GABAPENTIN 600 MG PO TABS: 600.0000 mg | ORAL_TABLET | Freq: Three times a day (TID) | ORAL | 0 refills | Status: DC

## 2023-08-04 MED ORDER — METHYLPREDNISOLONE ACETATE 40 MG/ML IJ SUSP
40.0000 mg | Freq: Once | INTRAMUSCULAR | Status: AC
  Administered 2023-08-04: 40 mg via INTRA_ARTICULAR

## 2023-08-04 NOTE — Telephone Encounter (Signed)
Copied from CRM [id number]. Topic: Appointments - Appointment Scheduling >> Aug 04, 2023 10:00 AM [place] A wrote: Patient called to schedule labs/ patient said her back is not any better

## 2023-08-04 NOTE — Telephone Encounter (Signed)
Please call patient to schedule appointment for lab to check hemoglobin A1c and order the lab. Please also check how is she doing with her back pain now?

## 2023-08-04 NOTE — Telephone Encounter (Signed)
Ordered A1c lab per [employer]. Left message to call back and schedule and let us know how her back is and sent a my chart message with same information in it.

## 2023-08-04 NOTE — Patient Instructions (Signed)
You have received an injection of steroids into the joint. 15% of patients will have increased pain within the 24 hours postinjection.   This is transient and will go away.   We recommend that you use ice packs on the injection site for 20 minutes every 2 hours and extra strength Tylenol 2 tablets every 8 as needed until the pain resolves.  If you continue to have pain after taking the Tylenol and using the ice please call the office for further instructions.   CONTINUE TYENOL 1000MG  AS NEEDED

## 2023-08-04 NOTE — Progress Notes (Signed)
Patient: Andrea [name]           Date of Birth: [birth year]           MRN: [id number] Visit Date: 08/04/2023 Requested by: Kara Dies, NP [address] [place],  [place] [address] PCP: Kara Dies, NP   Chief Complaint  Patient presents with   Knee Pain    Bilateral    Encounter Diagnoses  Name Primary?   Acute pain of both knees Yes   Primary osteoarthritis of right knee    Primary osteoarthritis of left knee     Plan:  43 year old female status post ORIF of a trimalleolar fracture comes in for bilateral knee pain evaluation.  The fracture fixation fell apart patient required IM nail and is in a cam walker.  Pain started after the IM nailing  Patient is on Eliquis.  Complains of right greater than left medial knee pain currently on Tylenol 1000 mg twice a day  Recommend continue Tylenol and try injections.  I would expect some of this to resolve after the patient is walking in normal shoes  Procedure note for bilateral knee injections  Procedure note left knee injection verbal consent was obtained to inject left knee joint  Timeout was completed to confirm the site of injection  The medications used were 40 mg depomedrol and 3 cc of 1% lidocaine  Anesthesia was provided by ethyl chloride and the skin was prepped with alcohol.  After cleaning the skin with alcohol a 20-gauge needle was used to inject the left knee joint. There were no complications. A sterile bandage was applied.   Procedure note right knee injection verbal consent was obtained to inject right knee joint  Timeout was completed to confirm the site of injection  The medications used were 40 mg depomedrol and 3 cc of 1% lidocaine  Anesthesia was provided by ethyl chloride and the skin was prepped with alcohol.  After cleaning the skin with alcohol a 20-gauge needle was used to inject the right knee joint. There were no complications. A sterile bandage was applied.   Chief Complaint  Patient  presents with   Knee Pain    Bilateral     43 year old female status post ORIF of a trimalleolar fracture comes in for bilateral knee pain evaluation.  The fracture fixation fell apart patient required IM nail and is in a cam walker.  Pain started after the IM nailing  Patient is on Eliquis.  Complains of right greater than left medial knee pain currently on Tylenol 1000 mg twice a day  No history of trauma     Body mass index is 40.45 kg/m.   Problem list, medical hx, medications and allergies reviewed   Review of Systems  Musculoskeletal:  Positive for joint pain.     Allergies  Allergen Reactions   Bactrim [Sulfamethoxazole-Trimethoprim] Hives and Itching   Codeine Hives and Itching    BP 122/80   Pulse 96   Ht 5\' 11"  (1.803 m)   Wt 290 lb (131.5 kg)   LMP  (LMP Unknown)   BMI 40.45 kg/m    Physical exam: Physical Exam Vitals and nursing note reviewed.  Constitutional:      Appearance: Normal appearance.  HENT:     Head: Normocephalic and atraumatic.  Eyes:     General: No scleral icterus.       Right eye: No discharge.        Left eye: No discharge.     Extraocular Movements:  Extraocular movements intact.     Conjunctiva/sclera: Conjunctivae normal.     Pupils: Pupils are equal, round, and reactive to light.  Cardiovascular:     Rate and Rhythm: Normal rate.     Pulses: Normal pulses.  Musculoskeletal:     Right knee: No effusion.     Left knee: No effusion.  Skin:    General: Skin is warm and dry.     Capillary Refill: Capillary refill takes less than 2 seconds.  Neurological:     General: No focal deficit present.     Mental Status: She is alert and oriented to person, place, and time.  Psychiatric:        Mood and Affect: Mood normal.        Behavior: Behavior normal.        Thought Content: Thought content normal.        Judgment: Judgment normal.     Right Knee Exam   Tenderness  The patient is experiencing tenderness in the medial  joint line.  Range of Motion  The patient has normal right knee ROM.  Tests  Drawer:  Anterior - negative    Posterior - negative  Other  Erythema: absent Scars: absent Swelling: none Effusion: no effusion present   Left Knee Exam   Tenderness  The patient is experiencing tenderness in the medial joint line.  Range of Motion  The patient has normal left knee ROM.  Tests  Drawer:  Anterior - negative     Posterior - negative  Other  Erythema: absent Scars: absent Swelling: none Effusion: no effusion present      Data reviewed:   Image(s) reviewed with personal interpretation: Prior imaging revealed mild arthritis of the knee  No results found.  FINDINGS: Vague vertical lucency along the medial posterior humeral condyle with no definite acute fracture. Otherwise no evidence of fracture, dislocation, or joint effusion. No evidence of arthropathy or other focal bone abnormality. Mild subcutaneus soft tissue edema.   IMPRESSION: 1. Vague vertical lucency along the medial humeral condyle with no definite acute fracture. Correlate with point tenderness to palpation. 2. Otherwise no acute displaced fracture or dislocation.     Electronically Signed   By: Tish Frederickson M.D.   On: 10/04/2022 01:58  EXAM: RIGHT KNEE - COMPLETE 4+ VIEW   COMPARISON:  None Available.   FINDINGS: No evidence of fracture, dislocation, or joint effusion. No evidence of arthropathy or other focal bone abnormality. Mild subcutaneus soft tissue edema.   IMPRESSION: No acute displaced fracture or dislocation.     Electronically Signed   By: Tish Frederickson M.D.   On: 10/04/2022 01:56 Assessment and plan:  Encounter Diagnoses  Name Primary?   Acute pain of both knees Yes   Primary osteoarthritis of right knee    Primary osteoarthritis of left knee       Meds ordered this encounter  Medications   methylPREDNISolone acetate (DEPO-MEDROL) injection 40 mg    methylPREDNISolone acetate (DEPO-MEDROL) injection 40 mg    Procedures:   Inject both knees  Procedure note for bilateral knee injections  Procedure note left knee injection verbal consent was obtained to inject left knee joint  Timeout was completed to confirm the site of injection  The medications used were 40 mg depomedrol and 3 cc of 1% lidocaine  Anesthesia was provided by ethyl chloride and the skin was prepped with alcohol.  After cleaning the skin with alcohol a 20-gauge needle was used to inject  the left knee joint. There were no complications. A sterile bandage was applied.   Procedure note right knee injection verbal consent was obtained to inject right knee joint  Timeout was completed to confirm the site of injection  The medications used were 40 mg depomedrol and 3 cc of 1% lidocaine  Anesthesia was provided by ethyl chloride and the skin was prepped with alcohol.  After cleaning the skin with alcohol a 20-gauge needle was used to inject the right knee joint. There were no complications. A sterile bandage was applied.

## 2023-08-04 NOTE — Progress Notes (Signed)
  Intake history:  BP 122/80   Pulse 96   Ht 5\' 11"  (1.803 m)   Wt 290 lb (131.5 kg)   LMP  (LMP Unknown)   BMI 40.45 kg/m  Body mass index is 40.45 kg/m.    WHAT ARE WE SEEING YOU FOR TODAY?   bilateral knee(s)  How long has this bothered you? (DOI?DOS?WS?)  For a few weeks   Anticoag.  Yes/eliquis  Diabetes Yes   Heart disease No  Hypertension No   SMOKING HX No  Kidney disease No  Any ALLERGIES ______________________________________________   Treatment:  Have you taken:  Tylenol Yes  Advil No  Had PT Yes PT for foot and hips  not for knees   Had injection No  Other  ___________voltaren gel Advil gel Lidocaine topical ______________

## 2023-08-05 ENCOUNTER — Encounter: Payer: Self-pay | Admitting: Nurse Practitioner

## 2023-08-05 ENCOUNTER — Telehealth (HOSPITAL_COMMUNITY): Payer: Self-pay | Admitting: Professional

## 2023-08-05 ENCOUNTER — Other Ambulatory Visit (INDEPENDENT_AMBULATORY_CARE_PROVIDER_SITE_OTHER): Payer: 59

## 2023-08-05 DIAGNOSIS — E119 Type 2 diabetes mellitus without complications: Secondary | ICD-10-CM

## 2023-08-05 DIAGNOSIS — Z7984 Long term (current) use of oral hypoglycemic drugs: Secondary | ICD-10-CM

## 2023-08-05 DIAGNOSIS — E538 Deficiency of other specified B group vitamins: Secondary | ICD-10-CM

## 2023-08-05 LAB — HEMOGLOBIN A1C: Hgb A1c MFr Bld: 7.1 % — ABNORMAL HIGH (ref 4.6–6.5)

## 2023-08-07 ENCOUNTER — Ambulatory Visit (INDEPENDENT_AMBULATORY_CARE_PROVIDER_SITE_OTHER): Payer: 59

## 2023-08-07 ENCOUNTER — Ambulatory Visit (INDEPENDENT_AMBULATORY_CARE_PROVIDER_SITE_OTHER): Payer: 59 | Admitting: Podiatry

## 2023-08-07 ENCOUNTER — Encounter: Payer: Self-pay | Admitting: Podiatry

## 2023-08-07 ENCOUNTER — Encounter: Payer: Self-pay | Admitting: Nurse Practitioner

## 2023-08-07 VITALS — Ht 71.0 in | Wt 290.0 lb

## 2023-08-07 DIAGNOSIS — M79671 Pain in right foot: Secondary | ICD-10-CM

## 2023-08-07 DIAGNOSIS — Z8781 Personal history of (healed) traumatic fracture: Secondary | ICD-10-CM

## 2023-08-07 DIAGNOSIS — Z9889 Other specified postprocedural states: Secondary | ICD-10-CM

## 2023-08-07 LAB — INTRINSIC FACTOR ANTIBODIES: Intrinsic Factor: NEGATIVE

## 2023-08-07 LAB — METHYLMALONIC ACID, SERUM: Methylmalonic Acid, Quant: 169 nmol/L (ref 55–335)

## 2023-08-07 MED ORDER — OXYCODONE-ACETAMINOPHEN 5-325 MG PO TABS: 1.0000 | ORAL_TABLET | ORAL | 0 refills | Status: DC | PRN

## 2023-08-07 NOTE — Progress Notes (Signed)
  Subjective:  Patient ID: Andrea [name], female    DOB: [birth year],  MRN: [id number]  DOS: 05/13/2023 Procedure: Right ankle hardware removal and tibiotalar calcaneal arthrodesis  43 y.o. female returns for post-op check.   Patient is now approximately 3 months out from right ankle TTC arthrodesis.  Still reporting pain and swelling in the right ankle which has gotten to an uncontrolled point for her.  She is requesting Percocet for pain.  She is walking currently in a cam boot and has pain with every step she says.  Review of Systems: Negative except as noted in the HPI. Denies N/V/F/Ch.   Objective:  There were no vitals filed for this visit. Body mass index is 40.45 kg/m. Constitutional Well developed. Well nourished.  Vascular Foot warm and well perfused. Capillary refill normal to all digits.  Calf is soft and supple, no posterior calf or knee pain, negative Homans' sign. Edema present within postoperative limits.  Neurologic Normal speech. Oriented to person, place, and time. Epicritic sensation to light touch grossly present bilaterally.  Dermatologic Surgical incisions healed well no evidence of dehiscence erythema drainage or other signs of infection  Orthopedic: Still significantly tender to palpation medial and lateral aspect of the subtalar joint.  Edema noted though may be slightly improved from prior    Radiographs: 08/07/2023 XR 3 views ankle right weightbearing with maintained alignment of the tibiotalar and subtalar joints.  Hindfoot nail intact without evidence of backing out or loss of compression at the ankle and subtalar joint.  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  S/p ankle surgery right to include right ankle hardware  removal with tibiotalar Arthrodesis with Nail. -Still with significant pain though alignment is maintained at this time.  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 -Will order CT scan of the right ankle to further evaluate - Maintain weightbearing as tolerated in cam boot at this time -E Rx for Percocet 5-3 25 sent for pain - Continue gabapentin 600 mg 3 times a day - Follow-up after completion of CT scan.  I also ordered a vitamin D level checked.  Of note patient is currently being supplemented with high-dose 50,000 IU vitamin D 3 once weekly but has not had her level checked recently.        Corinna Gab, DPM [facility] / [facility]                   08/07/2023

## 2023-08-11 ENCOUNTER — Other Ambulatory Visit: Payer: Self-pay | Admitting: Podiatry

## 2023-08-12 ENCOUNTER — Ambulatory Visit: Payer: 59 | Admitting: Nurse Practitioner

## 2023-08-12 MED ORDER — OXYCODONE-ACETAMINOPHEN 5-325 MG PO TABS: 1.0000 | ORAL_TABLET | ORAL | 0 refills | Status: AC | PRN

## 2023-08-14 ENCOUNTER — Ambulatory Visit
Admission: RE | Admit: 2023-08-14 | Discharge: 2023-08-14 | Disposition: A | Discharge status: Home / Self Care | Payer: 59 | Source: Ambulatory Visit | Attending: Podiatry | Admitting: Podiatry

## 2023-08-14 DIAGNOSIS — Z9889 Other specified postprocedural states: Secondary | ICD-10-CM

## 2023-08-15 LAB — VITAMIN D 25 HYDROXY (VIT D DEFICIENCY, FRACTURES): Vit D, 25-Hydroxy: 43.2 ng/mL (ref 30.0–100.0)

## 2023-08-18 ENCOUNTER — Encounter (HOSPITAL_COMMUNITY): Payer: Self-pay | Admitting: Psychiatry

## 2023-08-18 ENCOUNTER — Telehealth (HOSPITAL_COMMUNITY): Payer: 59 | Admitting: Psychiatry

## 2023-08-18 ENCOUNTER — Telehealth: Admitting: Physician Assistant

## 2023-08-18 DIAGNOSIS — F410 Panic disorder [episodic paroxysmal anxiety] without agoraphobia: Secondary | ICD-10-CM

## 2023-08-18 DIAGNOSIS — J019 Acute sinusitis, unspecified: Secondary | ICD-10-CM | POA: Diagnosis not present

## 2023-08-18 DIAGNOSIS — F4310 Post-traumatic stress disorder, unspecified: Secondary | ICD-10-CM | POA: Diagnosis not present

## 2023-08-18 DIAGNOSIS — F411 Generalized anxiety disorder: Secondary | ICD-10-CM | POA: Diagnosis not present

## 2023-08-18 DIAGNOSIS — G4709 Other insomnia: Secondary | ICD-10-CM

## 2023-08-18 DIAGNOSIS — G894 Chronic pain syndrome: Secondary | ICD-10-CM

## 2023-08-18 DIAGNOSIS — Z79899 Other long term (current) drug therapy: Secondary | ICD-10-CM

## 2023-08-18 DIAGNOSIS — B9689 Other specified bacterial agents as the cause of diseases classified elsewhere: Secondary | ICD-10-CM

## 2023-08-18 DIAGNOSIS — F445 Conversion disorder with seizures or convulsions: Secondary | ICD-10-CM

## 2023-08-18 DIAGNOSIS — H66001 Acute suppurative otitis media without spontaneous rupture of ear drum, right ear: Secondary | ICD-10-CM | POA: Diagnosis not present

## 2023-08-18 MED ORDER — QUETIAPINE FUMARATE 50 MG PO TABS: 50.0000 mg | ORAL_TABLET | Freq: Three times a day (TID) | ORAL | 2 refills | Status: DC

## 2023-08-18 MED ORDER — DULOXETINE HCL 60 MG PO CPEP: 120.0000 mg | ORAL_CAPSULE | Freq: Every day | ORAL | 0 refills | Status: DC

## 2023-08-18 MED ORDER — AMOXICILLIN-POT CLAVULANATE 875-125 MG PO TABS: 1.0000 | ORAL_TABLET | Freq: Two times a day (BID) | ORAL | 0 refills | Status: DC

## 2023-08-18 MED ORDER — FLUTICASONE PROPIONATE 50 MCG/ACT NA SUSP: 2.0000 | Freq: Every day | NASAL | 0 refills | Status: DC

## 2023-08-18 NOTE — Patient Instructions (Signed)
 Andrea [name], thank you for joining Margaretann Loveless, PA-C for today's virtual visit.  While this provider is not your primary care provider (PCP), if your PCP is located in our provider database this encounter information will be shared with them immediately following your visit.   A [facility] MyChart account gives you access to today's visit and all your visits, tests, and labs performed at [facility] " click here if you don't have a [facility] MyChart account or go to [email or web address]  Consent: (Patient) Andrea [name] provided verbal consent for this virtual visit at the beginning of the encounter.  Current Medications:  Current Outpatient Medications:    amoxicillin-clavulanate (AUGMENTIN) 875-125 MG tablet, Take 1 tablet by mouth 2 (two) times daily., Disp: 20 tablet, Rfl: 0   fluticasone (FLONASE) 50 MCG/ACT nasal spray, Place 2 sprays into both nostrils daily., Disp: 16 g, Rfl: 0   albuterol (PROVENTIL) (2.5 MG/3ML) 0.083% nebulizer solution, Take 2.5 mg by nebulization every 4 (four) hours as needed., Disp: , Rfl:    Albuterol-Budesonide (AIRSUPRA) 90-80 MCG/ACT AERO, , Disp: , Rfl:    Atogepant (QULIPTA) 30 MG TABS, Take 1 tablet daily, Disp: 30 tablet, Rfl: 5   cyclobenzaprine (FLEXERIL) 10 MG tablet, Take 0.5-1 tablets (5-10 mg total) by mouth 3 (three) times daily as needed., Disp: 30 tablet, Rfl: 0   DULoxetine (CYMBALTA) 60 MG capsule, Take 2 capsules (120 mg total) by mouth daily., Disp: 180 capsule, Rfl: 0   eletriptan (RELPAX) 40 MG tablet, Take 1 tablet at onset of migraine. May repeat in 2 hours if headache persists or recurs. Do not take more than 3 a week, Disp: 10 tablet, Rfl: 5   ELIQUIS 2.5 MG TABS tablet, TAKE 1 TABLET BY MOUTH TWICE A DAY, Disp: 60 tablet, Rfl: 0   famotidine (PEPCID) 40 MG tablet, Take 1 tablet (40 mg total) by mouth 2 (two) times daily., Disp: 180 tablet, Rfl: 0   gabapentin (NEURONTIN) 600 MG tablet, Take 1  tablet (600 mg total) by mouth 3 (three) times daily., Disp: 90 tablet, Rfl: 0   gabapentin (NEURONTIN) 600 MG tablet, TAKE 1 TABLET BY MOUTH THREE TIMES A DAY, Disp: 90 tablet, Rfl: 0   Galcanezumab-gnlm (EMGALITY) 120 MG/ML SOAJ, Inject 1 Pen into the skin every 30 (thirty) days., Disp: 1.12 mL, Rfl: 11   GEMTESA 75 MG TABS, Take 1 tablet by mouth daily., Disp: , Rfl:    JARDIANCE 25 MG TABS tablet, Take 25 mg by mouth every morning., Disp: , Rfl:    levocetirizine (XYZAL) 5 MG tablet, Take 5 mg by mouth at bedtime., Disp: , Rfl:    montelukast (SINGULAIR) 10 MG tablet, Take 1 tablet (10 mg total) by mouth at bedtime., Disp: 90 tablet, Rfl: 0   pantoprazole (PROTONIX) 40 MG tablet, Take 1 tablet (40 mg total) by mouth every morning., Disp: 90 tablet, Rfl: 1   potassium chloride (MICRO-K) 10 MEQ CR capsule, Take 10 mEq by mouth daily., Disp: , Rfl:    QUEtiapine (SEROQUEL) 50 MG tablet, Take 1 tablet (50 mg total) by mouth 3 (three) times daily., Disp: 90 tablet, Rfl: 2   RYBELSUS 14 MG TABS, Take 1 tablet by mouth daily., Disp: , Rfl:    SYMBICORT 160-4.5 MCG/ACT inhaler, Inhale 2 puffs into the lungs., Disp: , Rfl:    tiZANidine (ZANAFLEX) 4 MG tablet, Take 4 mg by mouth every 8 (eight) hours as needed., Disp: , Rfl:    traZODone (DESYREL)  150 MG tablet, Take 1 tablet (150 mg total) by mouth at bedtime., Disp: 30 tablet, Rfl: 0   Vitamin D, Ergocalciferol, (DRISDOL) 1.25 MG (50000 UNIT) CAPS capsule, TAKE 1 CAPSULE (50,000 UNITS TOTAL) BY MOUTH EVERY 7 (SEVEN) DAYS, Disp: 8 capsule, Rfl: 0   zonisamide (ZONEGRAN) 100 MG capsule, Take 5 capsules every night, Disp: 150 capsule, Rfl: 11   Medications ordered in this encounter:  Meds ordered this encounter  Medications   amoxicillin-clavulanate (AUGMENTIN) 875-125 MG tablet    Sig: Take 1 tablet by mouth 2 (two) times daily.    Dispense:  20 tablet    Refill:  0    Supervising Provider:   Merrilee Jansky [[id number]]   fluticasone (FLONASE) 50  MCG/ACT nasal spray    Sig: Place 2 sprays into both nostrils daily.    Dispense:  16 g    Refill:  0    Supervising Provider:   Merrilee Jansky [[id number]]     *If you need refills on other medications prior to your next appointment, please contact your pharmacy*  Follow-Up: Call back or seek an in-person evaluation if the symptoms worsen or if the condition fails to improve as anticipated.  [facility] [phone]  Other Instructions Sinus Infection, Adult A sinus infection, also called sinusitis, is inflammation of your sinuses. Sinuses are hollow spaces in the bones around your face. Your sinuses are located: Around your eyes. In the middle of your forehead. Behind your nose. In your cheekbones. Mucus normally drains out of your sinuses. When your nasal tissues become inflamed or swollen, mucus can become trapped or blocked. This allows bacteria, viruses, and fungi to grow, which leads to infection. Most infections of the sinuses are caused by a virus. A sinus infection can develop quickly. It can last for up to 4 weeks (acute) or for more than 12 weeks (chronic). A sinus infection often develops after a cold. What are the causes? This condition is caused by anything that creates swelling in the sinuses or stops mucus from draining. This includes: Allergies. Asthma. Infection from bacteria or viruses. Deformities or blockages in your nose or sinuses. Abnormal growths in the nose (nasal polyps). Pollutants, such as chemicals or irritants in the air. Infection from fungi. This is rare. What increases the risk? You are more likely to develop this condition if you: Have a weak body defense system (immune system). Do a lot of swimming or diving. Overuse nasal sprays. Smoke. What are the signs or symptoms? The main symptoms of this condition are pain and a feeling of pressure around the affected sinuses. Other symptoms include: Stuffy nose or congestion that  makes it difficult to breathe through your nose. Thick yellow or greenish drainage from your nose. Tenderness, swelling, and warmth over the affected sinuses. A cough that may get worse at night. Decreased sense of smell and taste. Extra mucus that collects in the throat or the back of the nose (postnasal drip) causing a sore throat or bad breath. Tiredness (fatigue). Fever. How is this diagnosed? This condition is diagnosed based on: Your symptoms. Your medical history. A physical exam. Tests to find out if your condition is acute or chronic. This may include: Checking your nose for nasal polyps. Viewing your sinuses using a device that has a light (endoscope). Testing for allergies or bacteria. Imaging tests, such as an MRI or CT scan. In rare cases, a bone biopsy may be done to rule out more serious types  of fungal sinus disease. How is this treated? Treatment for a sinus infection depends on the cause and whether your condition is chronic or acute. If caused by a virus, your symptoms should go away on their own within 10 days. You may be given medicines to relieve symptoms. They include: Medicines that shrink swollen nasal passages (decongestants). A spray that eases inflammation of the nostrils (topical intranasal corticosteroids). Rinses that help get rid of thick mucus in your nose (nasal saline washes). Medicines that treat allergies (antihistamines). Over-the-counter pain relievers. If caused by bacteria, your health care provider may recommend waiting to see if your symptoms improve. Most bacterial infections will get better without antibiotic medicine. You may be given antibiotics if you have: A severe infection. A weak immune system. If caused by narrow nasal passages or nasal polyps, surgery may be needed. Follow these instructions at home: Medicines Take, use, or apply over-the-counter and prescription medicines only as told by your health care provider. These may  include nasal sprays. If you were prescribed an antibiotic medicine, take it as told by your health care provider. Do not stop taking the antibiotic even if you start to feel better. Hydrate and humidify  Drink enough fluid to keep your urine pale yellow. Staying hydrated will help to thin your mucus. Use a cool mist humidifier to keep the humidity level in your home above 50%. Inhale steam for 10-15 minutes, 3-4 times a day, or as told by your health care provider. You can do this in the bathroom while a hot shower is running. Limit your exposure to cool or dry air. Rest Rest as much as possible. Sleep with your head raised (elevated). Make sure you get enough sleep each night. General instructions  Apply a warm, moist washcloth to your face 3-4 times a day or as told by your health care provider. This will help with discomfort. Use nasal saline washes as often as told by your health care provider. Wash your hands often with soap and water to reduce your exposure to germs. If soap and water are not available, use hand sanitizer. Do not smoke. Avoid being around people who are smoking (secondhand smoke). Keep all follow-up visits. This is important. Contact a health care provider if: You have a fever. Your symptoms get worse. Your symptoms do not improve within 10 days. Get help right away if: You have a severe headache. You have persistent vomiting. You have severe pain or swelling around your face or eyes. You have vision problems. You develop confusion. Your neck is stiff. You have trouble breathing. These symptoms may be an emergency. Get help right away. Call 911. Do not wait to see if the symptoms will go away. Do not drive yourself to the hospital. Summary A sinus infection is soreness and inflammation of your sinuses. Sinuses are hollow spaces in the bones around your face. This condition is caused by nasal tissues that become inflamed or swollen. The swelling traps or  blocks the flow of mucus. This allows bacteria, viruses, and fungi to grow, which leads to infection. If you were prescribed an antibiotic medicine, take it as told by your health care provider. Do not stop taking the antibiotic even if you start to feel better. Keep all follow-up visits. This is important. This information is not intended to replace advice given to you by your health care provider. Make sure you discuss any questions you have with your health care provider. Document Revised: 05/08/2021 Document Reviewed: 05/08/2021 Elsevier Patient  Education  2024 [employer]Otitis Media, Adult  Otitis media occurs when there is inflammation and fluid in the middle ear with signs and symptoms of an acute infection. The middle ear is a part of the ear that contains bones for hearing as well as air that helps send sounds to the brain. When infected fluid builds up in this space, it causes pressure and can lead to an ear infection. The eustachian tube connects the middle ear to the back of the nose (nasopharynx) and normally allows air into the middle ear. If the eustachian tube becomes blocked, fluid can build up and become infected. What are the causes? This condition is caused by a blockage in the eustachian tube. This can be caused by mucus or by swelling of the tube. Problems that can cause a blockage include: A cold or other upper respiratory infection. Allergies. An irritant, such as tobacco smoke. Enlarged adenoids. The adenoids are areas of soft tissue located high in the back of the throat, behind the nose and the roof of the mouth. They are part of the body's defense system (immune system). A mass in the nasopharynx. Damage to the ear caused by pressure changes (barotrauma). What increases the risk? You are more likely to develop this condition if you: Smoke or are exposed to tobacco smoke. Have an opening in the roof of your mouth (cleft palate). Have gastroesophageal reflux. Have  an immune system disorder. What are the signs or symptoms? Symptoms of this condition include: Ear pain. Fever. Decreased hearing. Tiredness (lethargy). Fluid leaking from the ear, if the eardrum is ruptured or has burst. Ringing in the ear. How is this diagnosed?  This condition is diagnosed with a physical exam. During the exam, your health care provider will use an instrument called an otoscope to look in your ear and check for redness, swelling, and fluid. He or she will also ask about your symptoms. Your health care provider may also order tests, such as: A pneumatic otoscopy. This is a test to check the movement of the eardrum. It is done by squeezing a small amount of air into the ear. A tympanogram. This is a test that shows how well the eardrum moves in response to air pressure in the ear canal. It provides a graph for your health care provider to review. How is this treated? This condition can go away on its own within 3-5 days. But if the condition is caused by a bacterial infection and does not go away on its own, or if it keeps coming back, your health care provider may: Prescribe antibiotic medicine to treat the infection. Prescribe or recommend medicines to control pain. Follow these instructions at home: Take over-the-counter and prescription medicines only as told by your health care provider. If you were prescribed an antibiotic medicine, take it as told by your health care provider. Do not stop taking the antibiotic even if you start to feel better. Keep all follow-up visits. This is important. Contact a health care provider if: You have bleeding from your nose. There is a lump on your neck. You are not feeling better in 5 days. You feel worse instead of better. Get help right away if: You have severe pain that is not controlled with medicine. You have swelling, redness, or pain around your ear. You have stiffness in your neck. A part of your face is not moving  (paralyzed). The bone behind your ear (mastoid bone) is tender when you touch it. You develop a  severe headache. Summary Otitis media is redness, soreness, and swelling of the middle ear, usually resulting in pain and decreased hearing. This condition can go away on its own within 3-5 days. If the problem does not go away in 3-5 days, your health care provider may give you medicines to treat the infection. If you were prescribed an antibiotic medicine, take it as told by your health care provider. Follow all instructions that were given to you by your health care provider. This information is not intended to replace advice given to you by your health care provider. Make sure you discuss any questions you have with your health care provider. Document Revised: 09/11/2020 Document Reviewed: 09/11/2020 Elsevier Patient Education  2024 [employer]   If you have been instructed to have an in-person evaluation today at a local Urgent Care facility, please use the link below. It will take you to a list of all of our available [facility], including address, phone number and hours of operation. Please do not delay care.  [facility]  If you or a family member do not have a primary care provider, use the link below to schedule a visit and establish care. When you choose a [facility] primary care physician or advanced practice provider, you gain a long-term partner in health. Find a Primary Care Provider  Learn more about [facility]'s in-office and virtual care options:  - Get Care Now

## 2023-08-18 NOTE — Patient Instructions (Signed)
 We decreased the Seroquel to 50 mg 3 times daily today.  Please continue to be careful with a combination of gabapentin, trazodone, Seroquel, oxycodone as this can be extra sedating as we addressed an appointment today.  Please touch base with your physical medicine prescribers to discuss the cream you are using for ankle pain.  If surgery is not imminent for your ankle please call the partial hospitalization program back to get started with intensive therapy.

## 2023-08-18 NOTE — Progress Notes (Signed)
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 patient was advised to call back or seek an in-person evaluation if the symptoms worsen or if the condition fails to improve as anticipated.  I provided 30 minutes of virtual face-to-face time during this encounter including referral for intensive therapy.  Elsie Lincoln, MD 08/18/2023, 2:51 PM

## 2023-08-18 NOTE — Progress Notes (Signed)
 Virtual Visit Consent   [name], you are scheduled for a virtual visit with a [facility] provider today. Just as with appointments in the office, your consent must be obtained to participate. Your consent will be active for this visit and any virtual visit you may have with one of our providers in the next 365 days. If you have a MyChart account, a copy of this consent can be sent to you electronically.  As this is a virtual visit, video technology does not allow for your provider to perform a traditional examination. This may limit your provider's ability to fully assess your condition. If your provider identifies any concerns that need to be evaluated in person or the need to arrange testing (such as labs, EKG, etc.), we will make arrangements to do so. Although advances in technology are sophisticated, we cannot ensure that it will always work on either your end or our end. If the connection with a video visit is poor, the visit may have to be switched to a telephone visit. With either a video or telephone visit, we are not always able to ensure that we have a secure connection.  By engaging in this virtual visit, you consent to the provision of healthcare and authorize for your insurance to be billed (if applicable) for the services provided during this visit. Depending on your insurance coverage, you may receive a charge related to this service.  I need to obtain your verbal consent now. Are you willing to proceed with your visit today? Andrea [name] has provided verbal consent on 08/18/2023 for a virtual visit (video or telephone). Margaretann Loveless, PA-C  Date: 08/18/2023 6:54 PM   Virtual Visit via Video Note   I, Margaretann Loveless, connected with  Andrea [name]  ([id number], 1981/04/01) on 08/18/23 at  6:45 PM EST by a video-enabled telemedicine application and verified that I am speaking with the correct person using two identifiers.  Location: Patient: Virtual Visit  Location Patient: Home Provider: Virtual Visit Location Provider: Home Office   I discussed the limitations of evaluation and management by telemedicine and the availability of in person appointments. The patient expressed understanding and agreed to proceed.    History of Present Illness: Andrea [name] is a 43 y.o. who identifies as a female who was assigned female at birth, and is being seen today for right sinus pain, right ear pain, and fevers.  HPI: URI  This is a new problem. The current episode started today. The problem has been gradually worsening. The maximum temperature recorded prior to her arrival was 101 - 101.9 F. The fever has been present for Less than 1 day. Associated symptoms include congestion, ear pain (right), headaches, a plugged ear sensation (right), rhinorrhea, sinus pain (right is tender and swollen), a sore throat and swollen glands. Pertinent negatives include no chest pain, coughing, diarrhea, nausea or wheezing. Associated symptoms comments: Crusting with eyes this morning, fatigue. She has tried acetaminophen for the symptoms. The treatment provided no relief.      Problems:  Patient Active Problem List   Diagnosis Date Noted   Acute right-sided low back pain without sciatica 07/18/2023   Acute right flank pain 07/18/2023   Fluctuating blood pressure 07/18/2023   Hypotension 07/02/2023   Occasional tremors 07/02/2023   Constipation 07/02/2023   Diabetes mellitus treated with oral medication (HCC) 07/02/2023   Syncope due to orthostatic hypotension 06/20/2023   Obesity (BMI 30-39.9) 05/14/2023   Acquired varus deformity  of right ankle 05/13/2023   Postoperative surgical complication involving musculoskeletal system associated with musculoskeletal procedure 05/12/2023   Closed right ankle fracture 05/03/2023   Bipolar 1 disorder (HCC) 05/03/2023   Type 2 diabetes mellitus with peripheral neuropathy (HCC) 05/03/2023   Essential hypertension 05/03/2023    Mild persistent asthma 05/03/2023   Closed trimalleolar fracture of right ankle 05/03/2023   Pre-syncope 05/03/2023   Chronic pain 07/16/2022   PTSD (post-traumatic stress disorder) 03/13/2022   Generalized anxiety disorder with panic attacks 03/13/2022   Major depressive disorder, 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 03/13/2022   Other insomnia 03/13/2022   Long term current use of antipsychotic medication 03/13/2022   Polypharmacy 03/13/2022   Convulsion (HCC) 11/26/2021   Irregular periods 09/26/2020   Mild persistent asthma with acute exacerbation 06/18/2017   Uncontrolled type 2 diabetes mellitus with hyperglycemia, with long-term current use of insulin (HCC) 09/18/2016   HTN (hypertension) 09/18/2016   Pelvic pain in female 11/14/2015   Acute pyelonephritis 01/16/2013   Migraines 10/30/2012   Obesity, Class III, BMI 40-49.9 (morbid obesity) (HCC) 10/30/2012   Kidney stones     Allergies:  Allergies  Allergen Reactions   Bactrim [Sulfamethoxazole-Trimethoprim] Hives and Itching   Codeine Hives and Itching   Medications:  Current Outpatient Medications:    amoxicillin-clavulanate (AUGMENTIN) 875-125 MG tablet, Take 1 tablet by mouth 2 (two) times daily., Disp: 20 tablet, Rfl: 0   fluticasone (FLONASE) 50 MCG/ACT nasal spray, Place 2 sprays into both nostrils daily., Disp: 16 g, Rfl: 0   albuterol (PROVENTIL) (2.5 MG/3ML) 0.083% nebulizer solution, Take 2.5 mg by nebulization every 4 (four) hours as needed., Disp: , Rfl:    Albuterol-Budesonide (AIRSUPRA) 90-80 MCG/ACT AERO, , Disp: , Rfl:    Atogepant (QULIPTA) 30 MG TABS, Take 1 tablet daily, Disp: 30 tablet, Rfl: 5   cyclobenzaprine (FLEXERIL) 10 MG tablet, Take 0.5-1 tablets (5-10 mg total) by mouth 3 (three) times daily as needed., Disp: 30 tablet, Rfl: 0   DULoxetine (CYMBALTA) 60 MG capsule, Take 2 capsules (120 mg total) by  mouth daily., Disp: 180 capsule, Rfl: 0   eletriptan (RELPAX) 40 MG tablet, Take 1 tablet at onset of migraine. May repeat in 2 hours if headache persists or recurs. Do not take more than 3 a week, Disp: 10 tablet, Rfl: 5   ELIQUIS 2.5 MG TABS tablet, TAKE 1 TABLET BY MOUTH TWICE A DAY, Disp: 60 tablet, Rfl: 0   famotidine (PEPCID) 40 MG tablet, Take 1 tablet (40 mg total) by mouth 2 (two) times daily., Disp: 180 tablet, Rfl: 0   gabapentin (NEURONTIN) 600 MG tablet, Take 1 tablet (600 mg total) by mouth 3 (three) times daily., Disp: 90 tablet, Rfl: 0   gabapentin (NEURONTIN) 600 MG tablet, TAKE 1 TABLET BY MOUTH THREE TIMES A DAY, Disp: 90 tablet, Rfl: 0   Galcanezumab-gnlm (EMGALITY) 120 MG/ML SOAJ, Inject 1 Pen into the skin every 30 (thirty) days., Disp: 1.12 mL, Rfl: 11   GEMTESA 75 MG TABS, Take 1 tablet by mouth daily., Disp: , Rfl:    JARDIANCE 25 MG TABS tablet, Take 25 mg by mouth every morning., Disp: , Rfl:    levocetirizine (XYZAL) 5 MG tablet, Take 5 mg by mouth at bedtime., Disp: , Rfl:    montelukast (SINGULAIR) 10 MG tablet, Take 1 tablet (10 mg total) by mouth at bedtime., Disp: 90 tablet, Rfl: 0   pantoprazole (PROTONIX)  40 MG tablet, Take 1 tablet (40 mg total) by mouth every morning., Disp: 90 tablet, Rfl: 1   potassium chloride (MICRO-K) 10 MEQ CR capsule, Take 10 mEq by mouth daily., Disp: , Rfl:    QUEtiapine (SEROQUEL) 50 MG tablet, Take 1 tablet (50 mg total) by mouth 3 (three) times daily., Disp: 90 tablet, Rfl: 2   RYBELSUS 14 MG TABS, Take 1 tablet by mouth daily., Disp: , Rfl:    SYMBICORT 160-4.5 MCG/ACT inhaler, Inhale 2 puffs into the lungs., Disp: , Rfl:    tiZANidine (ZANAFLEX) 4 MG tablet, Take 4 mg by mouth every 8 (eight) hours as needed., Disp: , Rfl:    traZODone (DESYREL) 150 MG tablet, Take 1 tablet (150 mg total) by mouth at bedtime., Disp: 30 tablet, Rfl: 0   Vitamin D, Ergocalciferol, (DRISDOL) 1.25 MG (50000 UNIT) CAPS capsule, TAKE 1 CAPSULE (50,000  UNITS TOTAL) BY MOUTH EVERY 7 (SEVEN) DAYS, Disp: 8 capsule, Rfl: 0   zonisamide (ZONEGRAN) 100 MG capsule, Take 5 capsules every night, Disp: 150 capsule, Rfl: 11  Observations/Objective: Patient is well-developed, well-nourished in no acute distress.  Resting comfortably at home.  Head is normocephalic, atraumatic.  No labored breathing.  Speech is clear and coherent with logical content.  Patient is alert and oriented at baseline.  Having swelling on right side of face with tenderness to palpation of the right maxillary sinus Reports swelling and tenderness of a gland or lymph node under the right jaw-line anterior to the ear  Assessment and Plan: 1. Acute bacterial sinusitis (Primary) - amoxicillin-clavulanate (AUGMENTIN) 875-125 MG tablet; Take 1 tablet by mouth 2 (two) times daily.  Dispense: 20 tablet; Refill: 0 - fluticasone (FLONASE) 50 MCG/ACT nasal spray; Place 2 sprays into both nostrils daily.  Dispense: 16 g; Refill: 0  2. Non-recurrent acute suppurative otitis media of right ear without spontaneous rupture of tympanic membrane - amoxicillin-clavulanate (AUGMENTIN) 875-125 MG tablet; Take 1 tablet by mouth 2 (two) times daily.  Dispense: 20 tablet; Refill: 0  - Worsening symptoms that have not responded to OTC medications.  - Will give Augmentin - Continue saline nasal rinses - Add Flonase (Fluticasone) nasal spray for possible eustachian tube dysfunction - Steam and humidifier can help - Warm compress to ear and swollen lymph node - Stay well hydrated and get plenty of rest.  - Seek in person evaluation if no symptom improvement or if symptoms worsen   Follow Up Instructions: I discussed the assessment and treatment plan with the patient. The patient was provided an opportunity to ask questions and all were answered. The patient agreed with the plan and demonstrated an understanding of the instructions.  A copy of instructions were sent to the patient via MyChart unless  otherwise noted below.    The patient was advised to call back or seek an in-person evaluation if the symptoms worsen or if the condition fails to improve as anticipated.    Margaretann Loveless, PA-C

## 2023-08-21 ENCOUNTER — Ambulatory Visit (INDEPENDENT_AMBULATORY_CARE_PROVIDER_SITE_OTHER): Payer: 59 | Admitting: Podiatry

## 2023-08-21 DIAGNOSIS — M9689 Other intraoperative and postprocedural complications and disorders of the musculoskeletal system: Secondary | ICD-10-CM

## 2023-08-21 DIAGNOSIS — Z9889 Other specified postprocedural states: Secondary | ICD-10-CM

## 2023-08-21 MED ORDER — OXYCODONE-ACETAMINOPHEN 5-325 MG PO TABS: 1.0000 | ORAL_TABLET | ORAL | 0 refills | Status: DC | PRN

## 2023-08-21 NOTE — Progress Notes (Signed)
  Subjective:  Patient ID: Andrea [name], female    DOB: 1980-07-09,  MRN: [id number]  DOS: 05/13/2023 Procedure: Right ankle hardware removal and tibiotalar calcaneal arthrodesis  43 y.o. female returns for post-op check.    Following up on CT scan R ankle. Vitamin D level was 43.2, on supplementation currently. Still pain ambulating R ankle.   Review of Systems: Negative except as noted in the HPI. Denies N/V/F/Ch.   Objective:  There were no vitals filed for this visit. There is no height or weight on file to calculate BMI. Constitutional Well developed. Well nourished.  Vascular Foot warm and well perfused. Capillary refill normal to all digits.  Calf is soft and supple, no posterior calf or knee pain, negative Homans' sign. Edema present within postoperative limits.  Neurologic Normal speech. Oriented to person, place, and time. Epicritic sensation to light touch grossly present bilaterally.  Dermatologic Surgical incisions healed well no evidence of dehiscence erythema drainage or other signs of infection  Orthopedic: Still significantly tender to palpation medial and lateral aspect of the subtalar joint.  Edema noted though may be slightly improved from prior    Radiographs:  Deferred today CT R ankle 08/14/23:  1. 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 Malalignment is similar to immediate postoperative x-rays from May 13, 2023. 2. Minimal bridging bone along the lateral aspect of the posterior subtalar joint. The majority of the joint remains unfused. Assessment:   Status post right ankle TTC arthrodesis 3 months postop  Plan:  Patient was evaluated and treated and all questions answered.  S/p ankle surgery right to include right ankle hardware removal with tibiotalar Arthrodesis with Nail. -Given  above CT findings recommend we proceed with bone stimulator at this time.  Discussed with patient she is in agreement. -No evidence of infection clinically or radiographically.  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  Will attempt bone stimulator therapy for 4 to 8 weeks if no improvement will need to consider revision ORIF with removal of nail reprepped with joints and alternative internal fixation - Maintain weightbearing as tolerated for short distances and transfers only in cam boot at this time -E Rx for Percocet 5-3 25 sent for pain - Continue gabapentin 600 mg 3 times a day         Corinna Gab, DPM [facility] / [facility]                   08/21/2023

## 2023-08-23 ENCOUNTER — Telehealth: Admitting: Nurse Practitioner

## 2023-08-23 DIAGNOSIS — J208 Acute bronchitis due to other specified organisms: Secondary | ICD-10-CM | POA: Diagnosis not present

## 2023-08-23 MED ORDER — PREDNISONE 20 MG PO TABS: 20.0000 mg | ORAL_TABLET | Freq: Every day | ORAL | 0 refills | Status: DC

## 2023-08-23 NOTE — Progress Notes (Signed)
 E-Visit for Cough   We are sorry that you are not feeling well.  Here is how we plan to help!  Based on your presentation I believe you most likely have A cough due to a virus.  This is called viral bronchitis and is best treated by rest, plenty of fluids and control of the cough.  You may use Ibuprofen or Tylenol as directed to help your symptoms.     In addition I have prescribed:  Prednisone 20mg  daily for 5 days  From your responses in the eVisit questionnaire you describe inflammation in the upper respiratory tract which is causing a significant cough.  This is commonly called Bronchitis and has four common causes:   Allergies Viral Infections Acid Reflux Bacterial Infection Allergies, viruses and acid reflux are treated by controlling symptoms or eliminating the cause. An example might be a cough caused by taking certain blood pressure medications. You stop the cough by changing the medication. Another example might be a cough caused by acid reflux. Controlling the reflux helps control the cough.  USE OF BRONCHODILATOR ("RESCUE") INHALERS: There is a risk from using your bronchodilator too frequently.  The risk is that over-reliance on a medication which only relaxes the muscles surrounding the breathing tubes can reduce the effectiveness of medications prescribed to reduce swelling and congestion of the tubes themselves.  Although you feel brief relief from the bronchodilator inhaler, your asthma may actually be worsening with the tubes becoming more swollen and filled with mucus.  This can delay other crucial treatments, such as oral steroid medications. If you need to use a bronchodilator inhaler daily, several times per day, you should discuss this with your provider.  There are probably better treatments that could be used to keep your asthma under control.     HOME CARE Only take medications as instructed by your medical team. Complete the entire course of an antibiotic. Drink  plenty of fluids and get plenty of rest. Avoid close contacts especially the very young and the elderly Cover your mouth if you cough or cough into your sleeve. Always remember to wash your hands A steam or ultrasonic humidifier can help congestion.   GET HELP RIGHT AWAY IF: You develop worsening fever. You become short of breath You cough up blood. Your symptoms persist after you have completed your treatment plan MAKE SURE YOU  Understand these instructions. Will watch your condition. Will get help right away if you are not doing well or get worse.    Thank you for choosing an e-visit.  Your e-visit answers were reviewed by a board certified advanced clinical practitioner to complete your personal care plan. Depending upon the condition, your plan could have included both over the counter or prescription medications.  Please review your pharmacy choice. Make sure the pharmacy is open so you can pick up prescription now. If there is a problem, you may contact your provider through [employer] and have the prescription routed to another pharmacy.  Your safety is important to [place]. If you have drug allergies check your prescription carefully.   For the next 24 hours you can use MyChart to ask questions about today's visit, request a non-urgent call back, or ask for a work or school excuse. You will get an email in the next two days asking about your experience. I hope that your e-visit has been valuable and will speed your recovery.

## 2023-08-23 NOTE — Progress Notes (Signed)
 I have spent 5 minutes in review of e-visit questionnaire, review and updating patient chart, medical decision making and response to patient.   Claiborne Rigg, NP

## 2023-08-24 ENCOUNTER — Telehealth: Payer: Self-pay | Admitting: *Deleted

## 2023-08-24 NOTE — Telephone Encounter (Signed)
 error

## 2023-08-25 ENCOUNTER — Ambulatory Visit: Payer: 59

## 2023-08-25 ENCOUNTER — Encounter (HOSPITAL_COMMUNITY): Payer: Self-pay

## 2023-08-25 ENCOUNTER — Other Ambulatory Visit: Payer: Self-pay | Admitting: Podiatry

## 2023-08-25 DIAGNOSIS — E538 Deficiency of other specified B group vitamins: Secondary | ICD-10-CM

## 2023-08-26 MED ORDER — OXYCODONE-ACETAMINOPHEN 5-325 MG PO TABS: 1.0000 | ORAL_TABLET | ORAL | 0 refills | Status: AC | PRN

## 2023-08-27 ENCOUNTER — Encounter: Payer: Self-pay | Admitting: Podiatry

## 2023-08-29 ENCOUNTER — Telehealth: Payer: Self-pay | Admitting: Podiatry

## 2023-08-29 ENCOUNTER — Other Ambulatory Visit (HOSPITAL_COMMUNITY): Payer: Self-pay | Admitting: Psychiatry

## 2023-08-29 ENCOUNTER — Telehealth: Payer: Self-pay

## 2023-08-29 ENCOUNTER — Encounter: Payer: Self-pay | Admitting: Podiatry

## 2023-08-29 DIAGNOSIS — F410 Panic disorder [episodic paroxysmal anxiety] without agoraphobia: Secondary | ICD-10-CM

## 2023-08-29 DIAGNOSIS — G4709 Other insomnia: Secondary | ICD-10-CM

## 2023-08-29 NOTE — Telephone Encounter (Signed)
 I called patient to reschedule her B12 per E2C2 call.  Patient states she is taking OTC B12, 1,000 mg and would like to verify that she needs to have the B12 injection in our office as well.  I did not reschedule appointment as she may not need it.

## 2023-08-29 NOTE — Telephone Encounter (Signed)
 Patient stated Dr. Annamary Rummage was supposed to order a Bone Stimulator and she would like to know the status of the order?

## 2023-08-29 NOTE — Telephone Encounter (Signed)
 I sent message via MyChart for the pt to check her email for the letter she requested from Dr. Annamary Rummage. See prev notes

## 2023-08-29 NOTE — Telephone Encounter (Signed)
 Error

## 2023-09-01 ENCOUNTER — Telehealth: Admitting: Physician Assistant

## 2023-09-01 ENCOUNTER — Encounter: Payer: Self-pay | Admitting: Nurse Practitioner

## 2023-09-01 ENCOUNTER — Other Ambulatory Visit: Payer: Self-pay | Admitting: Nurse Practitioner

## 2023-09-01 DIAGNOSIS — J0100 Acute maxillary sinusitis, unspecified: Secondary | ICD-10-CM | POA: Diagnosis not present

## 2023-09-01 MED ORDER — DOXYCYCLINE HYCLATE 100 MG PO TABS: 100.0000 mg | ORAL_TABLET | Freq: Two times a day (BID) | ORAL | 0 refills | Status: AC

## 2023-09-01 NOTE — Progress Notes (Signed)

## 2023-09-01 NOTE — Telephone Encounter (Signed)
 Okay for pt to continue vit B12 oral supplement.

## 2023-09-01 NOTE — Telephone Encounter (Signed)
 Called Patient to let her know it is okay to continue the Vitamin B12 oral supplement per Kara Dies. Patient does not need B12 injections in our office at this time.

## 2023-09-02 ENCOUNTER — Other Ambulatory Visit: Payer: Self-pay | Admitting: Podiatry

## 2023-09-02 MED ORDER — OXYCODONE-ACETAMINOPHEN 5-325 MG PO TABS: 1.0000 | ORAL_TABLET | ORAL | 0 refills | Status: AC | PRN

## 2023-09-02 NOTE — Progress Notes (Signed)
 Refill of pain medications sent

## 2023-09-04 MED ORDER — TRAZODONE HCL 150 MG PO TABS
150.0000 mg | ORAL_TABLET | Freq: Every day | ORAL | 1 refills | Status: DC
Start: 2023-09-04 — End: 2024-04-27

## 2023-09-06 ENCOUNTER — Ambulatory Visit
Admission: EM | Admit: 2023-09-06 | Discharge: 2023-09-06 | Disposition: A | Discharge status: Home / Self Care | Attending: Emergency Medicine | Admitting: Emergency Medicine

## 2023-09-06 DIAGNOSIS — H9201 Otalgia, right ear: Secondary | ICD-10-CM

## 2023-09-06 DIAGNOSIS — R112 Nausea with vomiting, unspecified: Secondary | ICD-10-CM | POA: Diagnosis not present

## 2023-09-06 DIAGNOSIS — R1084 Generalized abdominal pain: Secondary | ICD-10-CM | POA: Diagnosis not present

## 2023-09-06 MED ORDER — ONDANSETRON 4 MG PO TBDP: 8.0000 mg | ORAL_TABLET | Freq: Three times a day (TID) | ORAL | 0 refills | Status: DC | PRN

## 2023-09-06 NOTE — ED Provider Notes (Signed)
 Andrea [name]    CSN: [id number] Arrival date & time: 09/06/23  6213      History   Chief Complaint Chief Complaint  Patient presents with   Emesis   Fever   Otalgia    HPI Andrea [name] is a 43 y.o. female.  Patient presents with 6-day history of fever, chills, headache, ear pain, vomiting, abdominal pain.  She is currently on doxycycline.  No cough, shortness of breath, diarrhea.  She has taken Zofran and Tylenol.  She took 4 mg of Zofran; she is running low on this prescription.  Patient had an e-visit on 09/01/2023; diagnosed with sinusitis; treated with doxycycline.  She had an e-visit on 08/23/2023; diagnosed with viral bronchitis; treated with prednisone.  She had a video visit on 08/18/2023; diagnosed with sinusitis and otitis media; treated with Augmentin and Flonase nasal spray.  The history is provided by the patient and medical records.    Past Medical History:  Diagnosis Date   Anxiety    Asthma    Bipolar 1 disorder (HCC)    Bipolar 1 disorder, mixed, severe (HCC) 12/20/2016   Bipolar I disorder, most recent episode depressed (HCC) 12/20/2016   Complication of anesthesia    hard time waking up    Depression    Diabetes mellitus without complication (HCC)    Drug induced akathisia 04/02/2022   GERD (gastroesophageal reflux disease)    no meds   Hypertension    Kidney stones    Long term current use of antipsychotic medication 03/13/2022   Low back pain    Migraines    PCOS (polycystic ovarian syndrome)    PONV (postoperative nausea and vomiting)    Schizophrenia (HCC)    Seizure (HCC) 10/03/2021   Seizures (HCC) 06/04/2016   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[facility] 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   Shortness of breath dyspnea    with bronchitis    Patient Active Problem List   Diagnosis Date Noted   Acute right-sided low back pain without sciatica 07/18/2023   Acute right flank pain 07/18/2023   Fluctuating blood pressure 07/18/2023   Hypotension 07/02/2023    Occasional tremors 07/02/2023   Constipation 07/02/2023   Diabetes mellitus treated with oral medication (HCC) 07/02/2023   Syncope due to orthostatic hypotension 06/20/2023   Obesity (BMI 30-39.9) 05/14/2023   Acquired varus deformity of right ankle 05/13/2023   Postoperative surgical complication involving musculoskeletal system associated with musculoskeletal procedure 05/12/2023   Closed right ankle fracture 05/03/2023   Bipolar 1 disorder (HCC) 05/03/2023   Type 2 diabetes mellitus with peripheral neuropathy (HCC) 05/03/2023   Essential hypertension 05/03/2023   Mild persistent asthma 05/03/2023   Closed trimalleolar fracture of right ankle 05/03/2023   Pre-syncope 05/03/2023   Chronic pain 07/16/2022   PTSD (post-traumatic stress disorder) 03/13/2022   Generalized anxiety disorder with panic attacks 03/13/2022   Major depressive disorder, 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 03/13/2022   Other insomnia 03/13/2022   Long term current use of antipsychotic medication 03/13/2022   Polypharmacy 03/13/2022   Convulsion (HCC) 11/26/2021   Irregular periods 09/26/2020   Mild persistent asthma with acute exacerbation 06/18/2017   Uncontrolled type 2 diabetes mellitus with hyperglycemia, with long-term current use of insulin (HCC) 09/18/2016   HTN (hypertension) 09/18/2016   Pelvic pain in female 11/14/2015   Acute pyelonephritis 01/16/2013   Migraines 10/30/2012   Obesity, Class III, BMI 40-49.9 (morbid obesity) (HCC) 10/30/2012  Kidney stones     Past Surgical History:  Procedure Laterality Date   ABDOMINAL HYSTERECTOMY N/A 11/14/2015   Procedure: HYSTERECTOMY ABDOMINAL;  Surgeon: Levi Aland, MD;  Location: [facility];  Service: Gynecology;  Laterality: N/A;   ANKLE FUSION Right 05/13/2023   Procedure: ARTHRODESIS ANKLE;  Surgeon: Pilar Plate, DPM;  Location: [facility];   Service: Orthopedics/Podiatry;  Laterality: Right;  Hardware removal, right ankle tibiot talo calcaneal arthrodesis   CHOLECYSTECTOMY     INTRAUTERINE DEVICE (IUD) INSERTION  06/14/2014   [facility]   LYMPH NODES REMOVED     ORIF ANKLE FRACTURE Right 05/03/2023   Procedure: OPEN REDUCTION INTERNAL FIXATION (ORIF) ANKLE FRACTURE;  Surgeon: Edwin Cap, DPM;  Location: [facility];  Service: Orthopedics/Podiatry;  Laterality: Right;   ovarian cyst removed     SALPINGOOPHORECTOMY Bilateral 11/14/2015   Procedure: SALPINGO OOPHORECTOMY;  Surgeon: Levi Aland, MD;  Location: [facility];  Service: Gynecology;  Laterality: Bilateral;    OB History   No obstetric history on file.      Home Medications    Prior to Admission medications   Medication Sig Start Date End Date Taking? Authorizing Provider  ondansetron (ZOFRAN-ODT) 4 MG disintegrating tablet Take 2 tablets (8 mg total) by mouth every 8 (eight) hours as needed for nausea or vomiting. 09/06/23  Yes Mickie Bail, NP  oxyCODONE-acetaminophen (PERCOCET/ROXICET) 5-325 MG tablet Take 1 tablet by mouth every 4 (four) hours as needed for up to 5 days. 09/02/23 09/07/23  Standiford, Jenelle Mages, DPM  albuterol (PROVENTIL) (2.5 MG/3ML) 0.083% nebulizer solution Take 2.5 mg by nebulization every 4 (four) hours as needed. 03/30/23   [provider]  Albuterol-Budesonide Paulene Floor) 90-80 MCG/ACT AERO  04/01/23   [provider]  Atogepant Bennie Pierini) 30 MG TABS Take 1 tablet daily 03/18/23   Van Clines, MD  doxycycline (VIBRA-TABS) 100 MG tablet Take 1 tablet (100 mg total) by mouth 2 (two) times daily for 10 days. 09/01/23 09/11/23  Gilberto Better, PA-C  DULoxetine (CYMBALTA) 60 MG capsule Take 2 capsules (120 mg total) by mouth daily. 08/18/23   Elsie Lincoln, MD  eletriptan (RELPAX) 40 MG tablet Take 1 tablet at onset of migraine. May repeat in 2 hours if headache persists or recurs. Do not take more than 3 a week  07/23/23   Van Clines, MD  ELIQUIS 2.5 MG TABS tablet TAKE 1 TABLET BY MOUTH TWICE A DAY 07/24/23   Standiford, Jenelle Mages, DPM  famotidine (PEPCID) 40 MG tablet Take 1 tablet (40 mg total) by mouth 2 (two) times daily. 07/28/23   Kara Dies, NP  fluticasone (FLONASE) 50 MCG/ACT nasal spray Place 2 sprays into both nostrils daily. 08/18/23   Margaretann Loveless, PA-C  gabapentin (NEURONTIN) 600 MG tablet Take 1 tablet (600 mg total) by mouth 3 (three) times daily. 08/04/23   Standiford, Jenelle Mages, DPM  gabapentin (NEURONTIN) 600 MG tablet TAKE 1 TABLET BY MOUTH THREE TIMES A DAY 08/04/23   Standiford, Jenelle Mages, DPM  Galcanezumab-gnlm (EMGALITY) 120 MG/ML SOAJ Inject 1 Pen into the skin every 30 (thirty) days. 01/13/23   Van Clines, MD  GEMTESA 75 MG TABS Take 1 tablet by mouth daily. 01/02/23   [provider]  JARDIANCE 25 MG TABS tablet Take 25 mg by mouth every morning.    [provider]  levocetirizine (XYZAL) 5 MG tablet Take 5 mg by mouth at bedtime.    [provider]  montelukast (SINGULAIR) 10 MG tablet Take 1 tablet (10 mg total) by mouth at bedtime. 07/02/23   Kara Dies, NP  pantoprazole (PROTONIX) 40 MG tablet Take 1 tablet (40 mg total) by mouth every morning. 07/02/23   Kara Dies, NP  potassium chloride (MICRO-K) 10 MEQ CR capsule Take 10 mEq by mouth daily. 10/09/22   [provider]  predniSONE (DELTASONE) 20 MG tablet Take 1 tablet (20 mg total) by mouth daily with breakfast. 08/23/23   Claiborne Rigg, NP  QUEtiapine (SEROQUEL) 50 MG tablet Take 1 tablet (50 mg total) by mouth 3 (three) times daily. 08/18/23   Elsie Lincoln, MD  RYBELSUS 14 MG TABS Take 1 tablet by mouth daily. 02/27/23   [provider]  SYMBICORT 160-4.5 MCG/ACT inhaler Inhale 2 puffs into the lungs. 08/13/22   [provider]  tiZANidine (ZANAFLEX) 4 MG tablet Take 4 mg by mouth every 8 (eight) hours as needed. 07/28/23   [provider]  traZODone (DESYREL) 150 MG tablet Take 1 tablet (150 mg total) by mouth at bedtime. 09/04/23   Kara Dies, NP  Vitamin D, Ergocalciferol, (DRISDOL) 1.25 MG (50000 UNIT) CAPS capsule TAKE 1 CAPSULE (50,000 UNITS TOTAL) BY MOUTH EVERY 7 (SEVEN) DAYS 07/28/23   Edwin Cap, DPM  zonisamide (ZONEGRAN) 100 MG capsule Take 5 capsules every night 12/06/22   Van Clines, MD    Family History Family History  Problem Relation Age of Onset   Miscarriages / Stillbirths Mother    Hypertension Mother    Heart disease Mother    Hypertension Father    Healthy Father    Healthy Sister    Healthy Brother     Social History Social History   Tobacco Use   Smoking status: Never   Smokeless tobacco: Never  Vaping Use   Vaping status: Never Used  Substance Use Topics   Alcohol use: Never   Drug use: Never     Allergies   Bactrim [sulfamethoxazole-trimethoprim] and Codeine   Review of Systems Review of Systems  Constitutional:  Positive for chills and fever.  HENT:  Positive for ear pain. Negative for ear discharge and sore throat.   Respiratory:  Negative for cough and shortness of breath.   Gastrointestinal:  Positive for abdominal pain, nausea and vomiting. Negative for diarrhea.  Neurological:  Positive for headaches.     Physical Exam Triage Vital Signs ED Triage Vitals  Encounter Vitals Group     BP 09/06/23 0855 115/86     Systolic BP Percentile --      Diastolic BP Percentile --      Pulse Rate 09/06/23 0855 72     Resp 09/06/23 0855 18     Temp 09/06/23 0855 98.1 F (36.7 C)     Temp src --      SpO2 09/06/23 0855 95 %     Weight --      Height --      Head Circumference --      Peak Flow --      Pain Score 09/06/23 0844 5     Pain Loc --      Pain Education --      Exclude from Growth Chart --    No data found.  Updated Vital Signs BP 115/86   Pulse 72   Temp 98.1 F (36.7 C)   Resp 18   LMP  (LMP Unknown)   SpO2 95%    Visual Acuity  Right Eye Distance:   Left Eye Distance:   Bilateral Distance:    Right Eye Near:   Left Eye Near:    Bilateral Near:     Physical Exam Constitutional:      General: She is not in acute distress.    Appearance: She is obese.  HENT:     Right Ear: Tympanic membrane normal.     Left Ear: Tympanic membrane normal.     Nose: Nose normal.     Mouth/Throat:     Mouth: Mucous membranes are moist.     Pharynx: Oropharynx is clear.  Cardiovascular:     Rate and Rhythm: Normal rate and regular rhythm.     Heart sounds: Normal heart sounds.  Pulmonary:     Effort: Pulmonary effort is normal. No respiratory distress.     Breath sounds: Normal breath sounds.  Abdominal:     General: Bowel sounds are normal.     Palpations: Abdomen is soft.     Tenderness: There is no abdominal tenderness. There is no guarding or rebound.  Neurological:     Mental Status: She is alert.      UC Treatments / Results  Labs (all labs ordered are listed, but only abnormal results are displayed) Labs Reviewed - No data to display  EKG   Radiology No results found.  Procedures Procedures (including critical care time)  Medications Ordered in UC Medications - No data to display  Initial Impression / Assessment and Plan / UC Course  I have reviewed the triage vital signs and the nursing notes.  Pertinent labs & imaging results that were available during my care of the patient were reviewed by me and considered in my medical decision making (see chart for details).    Nausea and vomiting, abdominal pain, ear pain.  Afebrile, vital signs are stable.  Abdomen is soft and nontender with good bowel sounds.  Patient is currently on doxycycline; discussed that this may be causing her stomach upset.  She has been taking 4 mg of Zofran for the nausea and vomiting.  Increasing that to 8 mg every 8 hours as needed; prescription sent to pharmacy.  Discussed clear liquid diet and advance as  tolerated.  Discussed with patient that she should be monitoring her blood sugars closely.  ED precautions discussed.  Instructed her to follow-up with her PCP on Monday.  Education provided on nausea and vomiting, abdominal pain, earache.  She agrees to plan of care.  Final Clinical Impressions(s) / UC Diagnoses   Final diagnoses:  Nausea and vomiting, unspecified vomiting type  Generalized abdominal pain  Right ear pain     Discharge Instructions      Take the antinausea medication as directed.    Keep yourself hydrated with clear liquids, such as water.  Advance your diet as tolerated.   Go to the emergency department if you have worsening symptoms.    Follow up with your primary care provider on Monday.        ED Prescriptions     Medication Sig Dispense Auth. Provider   ondansetron (ZOFRAN-ODT) 4 MG disintegrating tablet Take 2 tablets (8 mg total) by mouth every 8 (eight) hours as needed for nausea or vomiting. 30 tablet Mickie Bail, NP      PDMP not reviewed this encounter.   Mickie Bail, NP 09/06/23 [phone]

## 2023-09-06 NOTE — ED Triage Notes (Addendum)
 Patient to Urgent Care with complaints of fevers (102) / emesis/ right sided ear pain (drainage)/ chills/ sweats/ stomach cramps/ right sided headache.   Reports symptoms started Monday. Started vomiting two days ago. Able to tolerate liquids.   Meds: tylenol/ 4mg  ODT zofran (states provides little relief). Last dose of zofran 6am.

## 2023-09-06 NOTE — Discharge Instructions (Addendum)
 Take the antinausea medication as directed.    Keep yourself hydrated with clear liquids, such as water.  Advance your diet as tolerated.   Go to the emergency department if you have worsening symptoms.    Follow up with your primary care provider on Monday.

## 2023-09-09 ENCOUNTER — Other Ambulatory Visit: Payer: Self-pay | Admitting: Podiatry

## 2023-09-09 ENCOUNTER — Encounter: Payer: Self-pay | Admitting: Podiatry

## 2023-09-09 MED ORDER — OXYCODONE-ACETAMINOPHEN 5-325 MG PO TABS: 1.0000 | ORAL_TABLET | ORAL | 0 refills | Status: AC | PRN

## 2023-09-09 NOTE — Progress Notes (Signed)
Refill of percocet sent.

## 2023-09-10 ENCOUNTER — Other Ambulatory Visit: Payer: Self-pay | Admitting: Podiatry

## 2023-09-15 ENCOUNTER — Encounter (HOSPITAL_COMMUNITY): Payer: Self-pay | Admitting: Psychiatry

## 2023-09-15 ENCOUNTER — Telehealth (INDEPENDENT_AMBULATORY_CARE_PROVIDER_SITE_OTHER): Admitting: Psychiatry

## 2023-09-15 DIAGNOSIS — F411 Generalized anxiety disorder: Secondary | ICD-10-CM | POA: Diagnosis not present

## 2023-09-15 DIAGNOSIS — F445 Conversion disorder with seizures or convulsions: Secondary | ICD-10-CM

## 2023-09-15 DIAGNOSIS — F332 Major depressive disorder, recurrent severe without psychotic features: Secondary | ICD-10-CM | POA: Diagnosis not present

## 2023-09-15 DIAGNOSIS — F410 Panic disorder [episodic paroxysmal anxiety] without agoraphobia: Secondary | ICD-10-CM

## 2023-09-15 DIAGNOSIS — Z79899 Other long term (current) drug therapy: Secondary | ICD-10-CM

## 2023-09-15 DIAGNOSIS — F4310 Post-traumatic stress disorder, unspecified: Secondary | ICD-10-CM | POA: Diagnosis not present

## 2023-09-15 DIAGNOSIS — G4709 Other insomnia: Secondary | ICD-10-CM

## 2023-09-15 NOTE — Patient Instructions (Signed)
 Trying moving the afternoon dose of Seroquel tonight to have a total dose of 100 mg at night to see if this helps with sleep.  We still cannot increase the dose with the concurrent oxycodone as they can have an additive effect as we are trying to keep your fall risk as low as we can.  Once you know the status of the surgery for your ankle, I do still think it would be beneficial to reach out to the partial hospitalization team to do the intensive therapy.

## 2023-09-15 NOTE — Progress Notes (Signed)
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  Elsie Lincoln, MD 09/15/2023, 2:42 PM

## 2023-09-17 ENCOUNTER — Telehealth (HOSPITAL_COMMUNITY): Payer: Self-pay

## 2023-09-17 NOTE — Telephone Encounter (Signed)
 Prior authorization for pt's Quetiapine Tab 500MG  approved from 09/17/23 to 09/16/24. PA # [id number]

## 2023-09-18 ENCOUNTER — Other Ambulatory Visit: Payer: Self-pay

## 2023-09-18 ENCOUNTER — Ambulatory Visit (INDEPENDENT_AMBULATORY_CARE_PROVIDER_SITE_OTHER): Payer: MEDICAID | Admitting: Podiatry

## 2023-09-18 ENCOUNTER — Telehealth: Payer: Self-pay

## 2023-09-18 ENCOUNTER — Ambulatory Visit (INDEPENDENT_AMBULATORY_CARE_PROVIDER_SITE_OTHER): Payer: MEDICAID

## 2023-09-18 DIAGNOSIS — M216X1 Other acquired deformities of right foot: Secondary | ICD-10-CM | POA: Diagnosis not present

## 2023-09-18 DIAGNOSIS — Z9889 Other specified postprocedural states: Secondary | ICD-10-CM

## 2023-09-18 DIAGNOSIS — Z8781 Personal history of (healed) traumatic fracture: Secondary | ICD-10-CM

## 2023-09-18 DIAGNOSIS — M9689 Other intraoperative and postprocedural complications and disorders of the musculoskeletal system: Secondary | ICD-10-CM | POA: Diagnosis not present

## 2023-09-18 MED ORDER — OXYCODONE-ACETAMINOPHEN 5-325 MG PO TABS: 1.0000 | ORAL_TABLET | ORAL | 0 refills | Status: DC | PRN

## 2023-09-18 MED ORDER — QULIPTA 30 MG PO TABS: ORAL_TABLET | ORAL | 5 refills | Status: DC

## 2023-09-18 NOTE — Telephone Encounter (Signed)
 Pharmacy sent in paperwork stated that pt needs PA for emgality

## 2023-09-18 NOTE — Progress Notes (Signed)
  Subjective:  Patient ID: Andrea [name], female    DOB: 1981/03/14,  MRN: [id number]  DOS: 05/13/2023 Procedure: Right ankle hardware removal and tibiotalar calcaneal arthrodesis  43 y.o. female returns for post-op check.    She is now approximately 4 months after surgery.  Has had chronic pain and issues postoperatively.  Walking in a cam boot but not improved from prior.  She still has severe pain when ambulating with the cam boot.  Here to discuss further options.  She was not able to get a bone stimulator.  Review of Systems: Negative except as noted in the HPI. Denies N/V/F/Ch.   Objective:  There were no vitals filed for this visit. There is no height or weight on file to calculate BMI. Constitutional Well developed. Well nourished.  Vascular Foot warm and well perfused. Capillary refill normal to all digits.  Calf is soft and supple, no posterior calf or knee pain, negative Homans' sign. Edema present within postoperative limits.  Neurologic Normal speech. Oriented to person, place, and time. Epicritic sensation to light touch grossly present bilaterally.  Dermatologic Surgical incisions healed well no evidence of dehiscence erythema drainage or other signs of infection  Orthopedic: Still significantly tender to palpation medial and lateral aspect of the subtalar joint.       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  Appears to be slight increased osseous bridging across the subtalar joint however still with incomplete healing.  No evidence of broken screws at this time. Assessment:   Status post right ankle TTC arthrodesis 4 months postop  Plan:  Patient was evaluated and treated and all questions answered.  S/p ankle surgery right to include right ankle hardware removal with tibiotalar Arthrodesis with Nail. -Discussed with  patient options at this point in time would include revision surgery with removal of the current hardware and placement of new internal fixation that would likely include plate and screws.  Discussed high risk of residual nonunion and risk of amputation if she proceeds with this as well as prolonged nonweightbearing -Alternatively option would be for below the knee amputation electively which may allow her to get back to walking with a prosthetic sooner than with revision surgery. -Discussed the risk and benefits of above options with the patient and she states she would like additional time to think about the options and I agree with this she will contact me with her decision for either revision versus amputation. - Maintain weightbearing as tolerated for short distances and transfers only in cam boot at this time -E Rx for Percocet 5-3 25 sent for pain - Continue gabapentin 600 mg 3 times a day         Corinna Gab, DPM [facility] / [facility]                   09/18/2023

## 2023-09-19 ENCOUNTER — Telehealth: Admitting: Family Medicine

## 2023-09-19 ENCOUNTER — Encounter: Payer: Self-pay | Admitting: Podiatry

## 2023-09-19 DIAGNOSIS — F410 Panic disorder [episodic paroxysmal anxiety] without agoraphobia: Secondary | ICD-10-CM

## 2023-09-19 NOTE — Progress Notes (Signed)
 Pt did not show for visit [facility]

## 2023-09-22 ENCOUNTER — Other Ambulatory Visit: Payer: Self-pay | Admitting: Podiatry

## 2023-09-22 ENCOUNTER — Telehealth: Payer: Self-pay | Admitting: Pharmacy Technician

## 2023-09-22 ENCOUNTER — Encounter: Payer: Self-pay | Admitting: Neurology

## 2023-09-22 ENCOUNTER — Telehealth: Payer: Self-pay

## 2023-09-22 ENCOUNTER — Encounter: Payer: Self-pay | Admitting: Podiatry

## 2023-09-22 ENCOUNTER — Other Ambulatory Visit (HOSPITAL_COMMUNITY): Payer: Self-pay

## 2023-09-22 ENCOUNTER — Telehealth: Payer: MEDICAID | Admitting: Physician Assistant

## 2023-09-22 DIAGNOSIS — F4323 Adjustment disorder with mixed anxiety and depressed mood: Secondary | ICD-10-CM

## 2023-09-22 DIAGNOSIS — Z8781 Personal history of (healed) traumatic fracture: Secondary | ICD-10-CM

## 2023-09-22 MED ORDER — HYDROXYZINE PAMOATE 25 MG PO CAPS: 25.0000 mg | ORAL_CAPSULE | Freq: Three times a day (TID) | ORAL | 0 refills | Status: DC | PRN

## 2023-09-22 NOTE — Telephone Encounter (Signed)
 Hi Demere Dotzler, I placed a referral for Abrie to see Lucita Ferrara MD with [facility] today. Can you please facilitate that the referral makes it to his office? thanks

## 2023-09-22 NOTE — Telephone Encounter (Signed)
 PA has been submitted, and telephone encounter has been created. Please see telephone encounter dated 4.7.25, for all three medications.

## 2023-09-22 NOTE — Patient Instructions (Signed)
 Andrea [name], thank you for joining Margaretann Loveless, PA-C for today's virtual visit.  While this provider is not your primary care provider (PCP), if your PCP is located in our provider database this encounter information will be shared with them immediately following your visit.   A [facility] MyChart account gives you access to today's visit and all your visits, tests, and labs performed at [facility] " click here if you don't have a [facility] MyChart account or go to [email or web address]  Consent: (Patient) Andrea [name] provided verbal consent for this virtual visit at the beginning of the encounter.  Current Medications:  Current Outpatient Medications:    hydrOXYzine (VISTARIL) 25 MG capsule, Take 1 capsule (25 mg total) by mouth every 8 (eight) hours as needed., Disp: 30 capsule, Rfl: 0   albuterol (PROVENTIL) (2.5 MG/3ML) 0.083% nebulizer solution, Take 2.5 mg by nebulization every 4 (four) hours as needed., Disp: , Rfl:    Albuterol-Budesonide (AIRSUPRA) 90-80 MCG/ACT AERO, , Disp: , Rfl:    Atogepant (QULIPTA) 30 MG TABS, Take 1 tablet daily, Disp: 30 tablet, Rfl: 5   DULoxetine (CYMBALTA) 60 MG capsule, Take 2 capsules (120 mg total) by mouth daily., Disp: 180 capsule, Rfl: 0   eletriptan (RELPAX) 40 MG tablet, Take 1 tablet at onset of migraine. May repeat in 2 hours if headache persists or recurs. Do not take more than 3 a week, Disp: 10 tablet, Rfl: 5   ELIQUIS 2.5 MG TABS tablet, TAKE 1 TABLET BY MOUTH TWICE A DAY, Disp: 60 tablet, Rfl: 0   famotidine (PEPCID) 40 MG tablet, Take 1 tablet (40 mg total) by mouth 2 (two) times daily., Disp: 180 tablet, Rfl: 0   gabapentin (NEURONTIN) 600 MG tablet, TAKE 1 TABLET BY MOUTH THREE TIMES A DAY, Disp: 90 tablet, Rfl: 0   Galcanezumab-gnlm (EMGALITY) 120 MG/ML SOAJ, Inject 1 Pen into the skin every 30 (thirty) days., Disp: 1.12 mL, Rfl: 11   GEMTESA 75 MG TABS, Take 1 tablet by mouth daily., Disp: , Rfl:     JARDIANCE 25 MG TABS tablet, Take 25 mg by mouth every morning., Disp: , Rfl:    montelukast (SINGULAIR) 10 MG tablet, Take 1 tablet (10 mg total) by mouth at bedtime., Disp: 90 tablet, Rfl: 0   ondansetron (ZOFRAN-ODT) 4 MG disintegrating tablet, Take 2 tablets (8 mg total) by mouth every 8 (eight) hours as needed for nausea or vomiting., Disp: 30 tablet, Rfl: 0   oxyCODONE-acetaminophen (PERCOCET/ROXICET) 5-325 MG tablet, Take 1 tablet by mouth every 4 (four) hours as needed for up to 5 days for severe pain (pain score 7-10)., Disp: 30 tablet, Rfl: 0   pantoprazole (PROTONIX) 40 MG tablet, Take 1 tablet (40 mg total) by mouth every morning., Disp: 90 tablet, Rfl: 1   potassium chloride (MICRO-K) 10 MEQ CR capsule, Take 10 mEq by mouth daily., Disp: , Rfl:    QUEtiapine (SEROQUEL) 50 MG tablet, Take 1 tablet (50 mg total) by mouth 3 (three) times daily., Disp: 90 tablet, Rfl: 2   RYBELSUS 14 MG TABS, Take 1 tablet by mouth daily., Disp: , Rfl:    SYMBICORT 160-4.5 MCG/ACT inhaler, Inhale 2 puffs into the lungs., Disp: , Rfl:    tiZANidine (ZANAFLEX) 4 MG tablet, Take 4 mg by mouth every 8 (eight) hours as needed., Disp: , Rfl:    traZODone (DESYREL) 150 MG tablet, Take 1 tablet (150 mg total) by mouth at bedtime., Disp: 90 tablet, Rfl: 1  Vitamin D, Ergocalciferol, (DRISDOL) 1.25 MG (50000 UNIT) CAPS capsule, TAKE 1 CAPSULE (50,000 UNITS TOTAL) BY MOUTH EVERY 7 (SEVEN) DAYS, Disp: 8 capsule, Rfl: 0   zonisamide (ZONEGRAN) 100 MG capsule, Take 5 capsules every night, Disp: 150 capsule, Rfl: 11   Medications ordered in this encounter:  Meds ordered this encounter  Medications   hydrOXYzine (VISTARIL) 25 MG capsule    Sig: Take 1 capsule (25 mg total) by mouth every 8 (eight) hours as needed.    Dispense:  30 capsule    Refill:  0    Supervising Provider:   Merrilee Jansky [[id number]]     *If you need refills on other medications prior to your next appointment, please contact your  pharmacy*  Follow-Up: Call back or seek an in-person evaluation if the symptoms worsen or if the condition fails to improve as anticipated.  [facility] [phone]  Other Instructions  Hydroxyzine Capsules or Tablets What is this medication? HYDROXYZINE (hye DROX i zeen) treats the symptoms of allergies and allergic reactions. It may also be used to treat anxiety or cause drowsiness before a procedure. It works by blocking histamine, a substance released by the body during an allergic reaction. It belongs to a group of medications called antihistamines. This medicine may be used for other purposes; ask your health care provider or pharmacist if you have questions. COMMON BRAND NAME(S): ANX, Atarax, Rezine, Vistaril What should I tell my care team before I take this medication? They need to know if you have any of these conditions: Glaucoma Heart disease Irregular heartbeat or rhythm Kidney disease Liver disease Lung or breathing disease, such as asthma Stomach or intestine problems Thyroid disease Trouble passing urine An unusual or allergic reaction to hydroxyzine, other medications, foods, dyes or preservatives Pregnant or trying to get pregnant Breastfeeding How should I use this medication? Take this medication by mouth with a full glass of water. Take it as directed on the prescription label at the same time every day. You can take it with or without food. If it upsets your stomach, take it with food. Talk to your care team about the use of this medication in children. While it may be prescribed for children as young as 6 years for selected conditions, precautions do apply. People 65 years and older may have a stronger reaction and need a smaller dose. Overdosage: If you think you have taken too much of this medicine contact a poison control center or emergency room at once. NOTE: This medicine is only for you. Do not share this medicine with others. What if I  miss a dose? If you miss a dose, take it as soon as you can. If it is almost time for your next dose, take only that dose. Do not take double or extra doses. What may interact with this medication? Do not take this medication with any of the following: Cisapride Dronedarone Pimozide Thioridazine This medication may also interact with the following: Alcohol Antihistamines for allergy, cough, and cold Atropine Barbiturate medications for sleep or seizures, such as phenobarbital Certain antibiotics, such as erythromycin or clarithromycin Certain medications for anxiety or sleep Certain medications for bladder problems, such as oxybutynin or tolterodine Certain medications for irregular heartbeat Certain medications for mental health conditions Certain medications for Parkinson disease, such as benztropine, trihexyphenidyl Certain medications for seizures, such as phenobarbital or primidone Certain medications for stomach problems, such as dicyclomine or hyoscyamine Certain medications for travel sickness, such as scopolamine  Ipratropium Opioid medications for pain Other medications that cause heart rhythm changes, such as dofetilide This list may not describe all possible interactions. Give your health care provider a list of all the medicines, herbs, non-prescription drugs, or dietary supplements you use. Also tell them if you smoke, drink alcohol, or use illegal drugs. Some items may interact with your medicine. What should I watch for while using this medication? Visit your care team for regular checks on your progress. Tell your care team if your symptoms do not start to get better or if they get worse. This medication may affect your coordination, reaction time, or judgment. Do not drive or operate machinery until you know how this medication affects you. Sit up or stand slowly to reduce the risk of dizzy or fainting spells. Drinking alcohol with this medication can increase the risk of  these side effects. Your mouth may get dry. Chewing sugarless gum or sucking hard candy and drinking plenty of water may help. Contact your care team if the problem does not go away or is severe. This medication may cause dry eyes and blurred vision. If you wear contact lenses, you may feel some discomfort. Lubricating eye drops may help. See your care team if the problem does not go away or is severe. If you are receiving skin tests for allergies, tell your care team you are taking this medication. What side effects may I notice from receiving this medication? Side effects that you should report to your care team as soon as possible: Allergic reactions--skin rash, itching, hives, swelling of the face, lips, tongue, or throat Heart rhythm changes--fast or irregular heartbeat, dizziness, feeling faint or lightheaded, chest pain, trouble breathing Side effects that usually do not require medical attention (report to your care team if they continue or are bothersome): Confusion Drowsiness Dry mouth Hallucinations Headache This list may not describe all possible side effects. Call your doctor for medical advice about side effects. You may report side effects to FDA at 1-800-FDA-1088. Where should I keep my medication? Keep out of the reach of children and pets. Store at room temperature between 15 and 30 degrees C (59 and 86 degrees F). Keep container tightly closed. Throw away any unused medication after the expiration date. NOTE: This sheet is a summary. It may not cover all possible information. If you have questions about this medicine, talk to your doctor, pharmacist, or health care provider.  2024 Elsevier/Gold Standard (2022-01-11 00:00:00)   If you have been instructed to have an in-person evaluation today at a local Urgent Care facility, please use the link below. It will take you to a list of all of our available [facility], including address, phone number and hours of  operation. Please do not delay care.  [facility]  If you or a family member do not have a primary care provider, use the link below to schedule a visit and establish care. When you choose a [facility] primary care physician or advanced practice provider, you gain a long-term partner in health. Find a Primary Care Provider  Learn more about [facility] in-office and virtual care options: [facility] Get Care Now

## 2023-09-22 NOTE — Telephone Encounter (Signed)
 Pharmacy Patient Advocate Encounter   Received notification from Patient Advice Request messages that prior authorization for [facility] 120MG  is required/requested.   Insurance verification completed.   The patient is insured through [employer] .   Per test claim: PA required; PA submitted to above mentioned insurance via CoverMyMeds Key/confirmation #/[id number] Status is pending

## 2023-09-22 NOTE — Telephone Encounter (Signed)
 Pharmacy Patient Advocate Encounter   Received notification from Physician's Office that prior authorization for QULIPTA 30MG  is required/requested.   Insurance verification completed.   The patient is insured through [employer] .   Per test claim: PA required; PA submitted to above mentioned insurance via CoverMyMeds Key/confirmation #/[id number] Status is pending

## 2023-09-22 NOTE — Progress Notes (Signed)
 Virtual Visit Consent   Andrea [name], you are scheduled for a virtual visit with a  provider today. Just as with appointments in the office, your consent must be obtained to participate. Your consent will be active for this visit and any virtual visit you may have with one of our providers in the next 365 days. If you have a MyChart account, a copy of this consent can be sent to you electronically.  As this is a virtual visit, video technology does not allow for your provider to perform a traditional examination. This may limit your provider's ability to fully assess your condition. If your provider identifies any concerns that need to be evaluated in person or the need to arrange testing (such as labs, EKG, etc.), we will make arrangements to do so. Although advances in technology are sophisticated, we cannot ensure that it will always work on either your end or our end. If the connection with a video visit is poor, the visit may have to be switched to a telephone visit. With either a video or telephone visit, we are not always able to ensure that we have a secure connection.  By engaging in this virtual visit, you consent to the provision of healthcare and authorize for your insurance to be billed (if applicable) for the services provided during this visit. Depending on your insurance coverage, you may receive a charge related to this service.  I need to obtain your verbal consent now. Are you willing to proceed with your visit today? Andrea [name] has provided verbal consent on 09/22/2023 for a virtual visit (video or telephone). Margaretann Loveless, PA-C  Date: 09/22/2023 12:41 PM   Virtual Visit via Video Note   I, Margaretann Loveless, connected with  Andrea [name]  ([id number], 1981-01-13) on 09/22/23 at 12:30 PM EDT by a video-enabled telemedicine application and verified that I am speaking with the correct person using two identifiers.  Location: Patient: Virtual  Visit Location Patient: Home Provider: Virtual Visit Location Provider: Home Office   I discussed the limitations of evaluation and management by telemedicine and the availability of in person appointments. The patient expressed understanding and agreed to proceed.    History of Present Illness: Andrea [name] is a 43 y.o. who identifies as a female who was assigned female at birth, and is being seen today for acute exacerbation of anxiety and depression due to other health issue. PMH for MDD, GAD, and PTSD already on Duloxetine 120mg  daily, Seroquel 150mg  daily (50mg  TID) and Trazodone 150mg  at bedtime. Also on Gabapentin 600mg  three times daily and Percocet as needed for severe pain for a chronic ankle injury/pain. Had a severe trimalleolar fracture in Nov 2024. Has been having trouble since surgery with chronic pain. Saw Podiatry in follow up last week and was told it had not healed correctly. Has been told she is either going to require multiple surgeries or potentially even have to have a BKA. This news has really triggered her anxiety and depression. She has had trouble getting out of bed all weekend, crying, and shaking. She has called her psychiatrist, but her Psychiatrist is out of town this week.   Problems:  Patient Active Problem List   Diagnosis Date Noted   Acute right-sided low back pain without sciatica 07/18/2023   Acute right flank pain 07/18/2023   Fluctuating blood pressure 07/18/2023   Hypotension 07/02/2023   Occasional tremors 07/02/2023   Constipation 07/02/2023   Diabetes mellitus treated  with oral medication (HCC) 07/02/2023   Syncope due to orthostatic hypotension 06/20/2023   Obesity (BMI 30-39.9) 05/14/2023   Acquired varus deformity of right ankle 05/13/2023   Postoperative surgical complication involving musculoskeletal system associated with musculoskeletal procedure 05/12/2023   Closed right ankle fracture 05/03/2023   Bipolar 1 disorder (HCC) 05/03/2023    Type 2 diabetes mellitus with peripheral neuropathy (HCC) 05/03/2023   Essential hypertension 05/03/2023   Mild persistent asthma 05/03/2023   Closed trimalleolar fracture of right ankle 05/03/2023   Pre-syncope 05/03/2023   Chronic pain 07/16/2022   PTSD (post-traumatic stress disorder) 03/13/2022   Generalized anxiety disorder with panic attacks 03/13/2022   Major depressive disorder, 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 03/13/2022   Other insomnia 03/13/2022   Long term current use of antipsychotic medication 03/13/2022   Polypharmacy 03/13/2022   Convulsion (HCC) 11/26/2021   Irregular periods 09/26/2020   Mild persistent asthma with acute exacerbation 06/18/2017   Uncontrolled type 2 diabetes mellitus with hyperglycemia, with long-term current use of insulin (HCC) 09/18/2016   HTN (hypertension) 09/18/2016   Pelvic pain in female 11/14/2015   Acute pyelonephritis 01/16/2013   Migraines 10/30/2012   Obesity, Class III, BMI 40-49.9 (morbid obesity) (HCC) 10/30/2012   Kidney stones     Allergies:  Allergies  Allergen Reactions   Bactrim [Sulfamethoxazole-Trimethoprim] Hives and Itching   Codeine Hives and Itching   Medications:  Current Outpatient Medications:    hydrOXYzine (VISTARIL) 25 MG capsule, Take 1 capsule (25 mg total) by mouth every 8 (eight) hours as needed., Disp: 30 capsule, Rfl: 0   albuterol (PROVENTIL) (2.5 MG/3ML) 0.083% nebulizer solution, Take 2.5 mg by nebulization every 4 (four) hours as needed., Disp: , Rfl:    Albuterol-Budesonide (AIRSUPRA) 90-80 MCG/ACT AERO, , Disp: , Rfl:    Atogepant (QULIPTA) 30 MG TABS, Take 1 tablet daily, Disp: 30 tablet, Rfl: 5   DULoxetine (CYMBALTA) 60 MG capsule, Take 2 capsules (120 mg total) by mouth daily., Disp: 180 capsule, Rfl: 0   eletriptan (RELPAX) 40 MG tablet, Take 1 tablet at onset of migraine. May repeat in 2 hours if  headache persists or recurs. Do not take more than 3 a week, Disp: 10 tablet, Rfl: 5   ELIQUIS 2.5 MG TABS tablet, TAKE 1 TABLET BY MOUTH TWICE A DAY, Disp: 60 tablet, Rfl: 0   famotidine (PEPCID) 40 MG tablet, Take 1 tablet (40 mg total) by mouth 2 (two) times daily., Disp: 180 tablet, Rfl: 0   gabapentin (NEURONTIN) 600 MG tablet, TAKE 1 TABLET BY MOUTH THREE TIMES A DAY, Disp: 90 tablet, Rfl: 0   Galcanezumab-gnlm (EMGALITY) 120 MG/ML SOAJ, Inject 1 Pen into the skin every 30 (thirty) days., Disp: 1.12 mL, Rfl: 11   GEMTESA 75 MG TABS, Take 1 tablet by mouth daily., Disp: , Rfl:    JARDIANCE 25 MG TABS tablet, Take 25 mg by mouth every morning., Disp: , Rfl:    montelukast (SINGULAIR) 10 MG tablet, Take 1 tablet (10 mg total) by mouth at bedtime., Disp: 90 tablet, Rfl: 0   ondansetron (ZOFRAN-ODT) 4 MG disintegrating tablet, Take 2 tablets (8 mg total) by mouth every 8 (eight) hours as needed for nausea or vomiting., Disp: 30 tablet, Rfl: 0   oxyCODONE-acetaminophen (PERCOCET/ROXICET) 5-325 MG tablet, Take 1 tablet by mouth every 4 (four) hours as needed for up to 5 days for severe pain (pain score 7-10)., Disp: 30 tablet,  Rfl: 0   pantoprazole (PROTONIX) 40 MG tablet, Take 1 tablet (40 mg total) by mouth every morning., Disp: 90 tablet, Rfl: 1   potassium chloride (MICRO-K) 10 MEQ CR capsule, Take 10 mEq by mouth daily., Disp: , Rfl:    QUEtiapine (SEROQUEL) 50 MG tablet, Take 1 tablet (50 mg total) by mouth 3 (three) times daily., Disp: 90 tablet, Rfl: 2   RYBELSUS 14 MG TABS, Take 1 tablet by mouth daily., Disp: , Rfl:    SYMBICORT 160-4.5 MCG/ACT inhaler, Inhale 2 puffs into the lungs., Disp: , Rfl:    tiZANidine (ZANAFLEX) 4 MG tablet, Take 4 mg by mouth every 8 (eight) hours as needed., Disp: , Rfl:    traZODone (DESYREL) 150 MG tablet, Take 1 tablet (150 mg total) by mouth at bedtime., Disp: 90 tablet, Rfl: 1   Vitamin D, Ergocalciferol, (DRISDOL) 1.25 MG (50000 UNIT) CAPS capsule, TAKE 1  CAPSULE (50,000 UNITS TOTAL) BY MOUTH EVERY 7 (SEVEN) DAYS, Disp: 8 capsule, Rfl: 0   zonisamide (ZONEGRAN) 100 MG capsule, Take 5 capsules every night, Disp: 150 capsule, Rfl: 11  Observations/Objective: Patient is well-developed, well-nourished in no acute distress.  Resting comfortably at home.  Head is normocephalic, atraumatic.  No labored breathing.  Speech is clear and coherent with logical content.  Patient is alert and oriented at baseline.    Assessment and Plan: 1. Situational mixed anxiety and depressive disorder (Primary) - hydrOXYzine (VISTARIL) 25 MG capsule; Take 1 capsule (25 mg total) by mouth every 8 (eight) hours as needed.  Dispense: 30 capsule; Refill: 0  - Acute exacerbation on chronic GAD, MDD, PTSD  - Add Hydroxyzine for as needed use every 8 hours - Continue other medications prescribed by Psychiatry - Advised to follow up with Psychiatry or PCP if Hydroxyzine does not help or if not tolerating  Follow Up Instructions: I discussed the assessment and treatment plan with the patient. The patient was provided an opportunity to ask questions and all were answered. The patient agreed with the plan and demonstrated an understanding of the instructions.  A copy of instructions were sent to the patient via MyChart unless otherwise noted below.    The patient was advised to call back or seek an in-person evaluation if the symptoms worsen or if the condition fails to improve as anticipated.    Margaretann Loveless, PA-C

## 2023-09-22 NOTE — Telephone Encounter (Signed)
 Please provide the names of the medications needed. Is patient on Qulipta, Emgality, and Relpax?

## 2023-09-22 NOTE — Progress Notes (Unsigned)
 Referral to Trilby Leaver MD [facility] for nonunion of TTC arthrodesis placed.

## 2023-09-22 NOTE — Telephone Encounter (Signed)
 Pharmacy Patient Advocate Encounter   Received notification from Physician's Office that prior authorization for RELPAX 40MG  is required/requested.   Insurance verification completed.   The patient is insured through [employer] .   Per test claim: PA required; PA submitted to above mentioned insurance via CoverMyMeds Key/confirmation #/EOC [id number] Status is pending

## 2023-09-23 ENCOUNTER — Other Ambulatory Visit: Payer: Self-pay | Admitting: Podiatry

## 2023-09-23 ENCOUNTER — Encounter: Payer: Self-pay | Admitting: Podiatry

## 2023-09-23 ENCOUNTER — Other Ambulatory Visit (HOSPITAL_COMMUNITY): Payer: Self-pay

## 2023-09-23 DIAGNOSIS — M9689 Other intraoperative and postprocedural complications and disorders of the musculoskeletal system: Secondary | ICD-10-CM

## 2023-09-23 MED ORDER — OXYCODONE-ACETAMINOPHEN 5-325 MG PO TABS: 1.0000 | ORAL_TABLET | ORAL | 0 refills | Status: AC | PRN

## 2023-09-23 NOTE — Telephone Encounter (Signed)
 Pharmacy Patient Advocate Encounter  Received notification from [facility] that Prior Authorization for QULIPTA 30MG  has been APPROVED from 4.8.25 to 7.8.25. Ran test claim, Copay is $4. This test claim was processed through [facility]- copay amounts may vary at other pharmacies due to pharmacy/plan contracts, or as the patient moves through the different stages of their insurance plan.   PA #/Case ID/Reference #: [id number]

## 2023-09-23 NOTE — Progress Notes (Signed)
 ABI pvr test order placed

## 2023-09-23 NOTE — Telephone Encounter (Signed)
 Pharmacy Patient Advocate Encounter  Received notification from [facility] that Prior Authorization for RELPAX 40MG  has been APPROVED from 4.8.25 to 4.8.26. Ran test claim, Copay is $4. This test claim was processed through [facility]- copay amounts may vary at other pharmacies due to pharmacy/plan contracts, or as the patient moves through the different stages of their insurance plan.   PA #/Case ID/Reference #: [id number]

## 2023-09-23 NOTE — Telephone Encounter (Signed)
 Pharmacy Patient Advocate Encounter  Received notification from [facility] that Prior Authorization for [facility] 120MG  has been APPROVED from 4.8.25 to 7.8.25. Ran test claim, Copay is $4. This test claim was processed through [facility]- copay amounts may vary at other pharmacies due to pharmacy/plan contracts, or as the patient moves through the different stages of their insurance plan.   PA #/Case ID/Reference #: [id number]

## 2023-09-24 ENCOUNTER — Other Ambulatory Visit: Payer: Self-pay

## 2023-09-24 MED ORDER — QULIPTA 30 MG PO TABS: ORAL_TABLET | ORAL | 5 refills | Status: DC

## 2023-09-26 ENCOUNTER — Encounter (HOSPITAL_COMMUNITY): Payer: Self-pay

## 2023-09-27 ENCOUNTER — Other Ambulatory Visit: Payer: Self-pay | Admitting: Nurse Practitioner

## 2023-09-29 ENCOUNTER — Encounter: Payer: Self-pay | Admitting: Podiatry

## 2023-09-29 ENCOUNTER — Telehealth: Payer: MEDICAID | Admitting: Physician Assistant

## 2023-09-29 DIAGNOSIS — H6501 Acute serous otitis media, right ear: Secondary | ICD-10-CM | POA: Diagnosis not present

## 2023-09-29 MED ORDER — AMOXICILLIN-POT CLAVULANATE 875-125 MG PO TABS: 1.0000 | ORAL_TABLET | Freq: Two times a day (BID) | ORAL | 0 refills | Status: DC

## 2023-09-29 NOTE — Progress Notes (Signed)

## 2023-10-01 ENCOUNTER — Other Ambulatory Visit (HOSPITAL_COMMUNITY): Payer: Self-pay

## 2023-10-02 ENCOUNTER — Other Ambulatory Visit: Payer: Self-pay | Admitting: Podiatry

## 2023-10-02 ENCOUNTER — Other Ambulatory Visit (INDEPENDENT_AMBULATORY_CARE_PROVIDER_SITE_OTHER): Payer: MEDICAID | Admitting: Podiatry

## 2023-10-02 MED ORDER — OXYCODONE-ACETAMINOPHEN 5-325 MG PO TABS: 1.0000 | ORAL_TABLET | ORAL | 0 refills | Status: AC | PRN

## 2023-10-02 NOTE — Progress Notes (Signed)
 Refill of pain meds sent

## 2023-10-07 ENCOUNTER — Other Ambulatory Visit (HOSPITAL_COMMUNITY): Payer: Self-pay

## 2023-10-07 ENCOUNTER — Telehealth: Payer: Self-pay | Admitting: Pharmacy Technician

## 2023-10-07 ENCOUNTER — Other Ambulatory Visit: Payer: Self-pay

## 2023-10-07 MED ORDER — RYBELSUS 14 MG PO TABS: 1.0000 | ORAL_TABLET | Freq: Every day | ORAL | 0 refills | Status: DC

## 2023-10-07 NOTE — Telephone Encounter (Signed)
 Pharmacy Patient Advocate Encounter   Received notification from CoverMyMeds that prior authorization for Rybelsus  14MG  tablets is required/requested.   Insurance verification completed.   The patient is insured through [employer] [place] .   Per test claim:  SEE BELOW is preferred by the insurance.  If suggested medication is appropriate, Please send in a new RX and discontinue this one. If not, please advise as to why it's not appropriate so that we may request a Prior Authorization. Please note, some preferred medications may still require a PA.  If the suggested medications have not been trialed and there are no contraindications to their use, the PA will not be submitted, as it will not be approved.    CMM Key: [id number]

## 2023-10-08 ENCOUNTER — Other Ambulatory Visit: Payer: Self-pay | Admitting: Podiatry

## 2023-10-08 ENCOUNTER — Encounter: Payer: Self-pay | Admitting: Podiatry

## 2023-10-08 ENCOUNTER — Ambulatory Visit: Payer: Self-pay

## 2023-10-08 MED ORDER — OXYCODONE-ACETAMINOPHEN 5-325 MG PO TABS: 1.0000 | ORAL_TABLET | ORAL | 0 refills | Status: DC | PRN

## 2023-10-08 NOTE — Telephone Encounter (Signed)
  Chief Complaint: blood sugars trending high in the AM Symptoms: AM blood sugars 300+ Frequency: about a week and a half Pertinent Negatives: Patient denies rapid breathing, SOB,  Disposition: [x] ED /[] Urgent Care (no appt availability in office) / [] Appointment(In office/virtual)/ []  [facility] [facility]/ [] Home Care/ [] Refused Recommended Disposition /[] [place] [facility]/ []  Follow-up with PCP Additional Notes: Pt states for about a week and a half her AM sugars have been 300+, pt states that she has had increased urination and increased thirst. Pt states that normally her sugars would be 170s in the morning. Pt also has an ear infection at this time. Pt denies SOB also denies rapid breathing.  Pt sounds like her mouth is very dry during the phone call. Pt states that in the past when this has happened she has had to have insulin  shots to stabilize her sugar. Pt advised to go to the ED at this time, pt agreeable. Pt states she took her last Jardiance  this morning and Rybelsus  yesterday. Pt states that insurance needs PA.  Copied from CRM [id number]. Topic: Clinical - Red Word Triage >> Oct 08, 2023  4:23 PM Andrea [name] wrote: Red Word that prompted transfer to Nurse Triage: Patient called reporting that her blood sugar has been over 300 this morning and has been consistently high for the past week. Reason for Disposition  Patient sounds very sick or weak to the triager  Answer Assessment - Initial Assessment Questions 1. BLOOD GLUCOSE: "What is your blood glucose level?"      300s in the morning 2. ONSET: "When did you check the blood glucose?"     About a week and a half 3. USUAL RANGE: "What is your glucose level usually?" (e.g., usual fasting morning value, usual evening value)     170s at that time of day 4. KETONES: "Do you check for ketones (urine or blood test strips)?" If Yes, ask: "What does the test show now?"      Denies having to do this 5. TYPE 1 or 2:  "Do you know what  type of diabetes you have?"  (e.g., Type 1, Type 2, Gestational; doesn't know)      2 6. INSULIN : "Do you take insulin ?" "What type of insulin (s) do you use? What is the mode of delivery? (syringe, pen; injection or pump)?"      denies 7. DIABETES PILLS: "Do you take any pills for your diabetes?" If Yes, ask: "Have you missed taking any pills recently?"     Takes shots and is out 8. OTHER SYMPTOMS: "Do you have any symptoms?" (e.g., fever, frequent urination, difficulty breathing, dizziness, weakness, vomiting)     Frequent urination, weak - but also has an ear infection.  9. PREGNANCY: "Is there any chance you are pregnant?" "When was your last menstrual period?"     denies  Protocols used: Diabetes - High Blood Sugar-A-AH

## 2023-10-08 NOTE — Progress Notes (Signed)
 Refill of pain meds sent

## 2023-10-09 ENCOUNTER — Telehealth: Payer: Self-pay

## 2023-10-09 ENCOUNTER — Other Ambulatory Visit (HOSPITAL_COMMUNITY): Payer: Self-pay | Admitting: Psychiatry

## 2023-10-09 ENCOUNTER — Encounter: Payer: Self-pay | Admitting: Nurse Practitioner

## 2023-10-09 ENCOUNTER — Ambulatory Visit: Payer: MEDICAID | Admitting: Nurse Practitioner

## 2023-10-09 ENCOUNTER — Other Ambulatory Visit (HOSPITAL_COMMUNITY): Payer: Self-pay

## 2023-10-09 VITALS — BP 124/84 | HR 72 | Temp 97.5°F | Ht 71.0 in | Wt 295.2 lb

## 2023-10-09 DIAGNOSIS — F411 Generalized anxiety disorder: Secondary | ICD-10-CM

## 2023-10-09 DIAGNOSIS — F410 Panic disorder [episodic paroxysmal anxiety] without agoraphobia: Secondary | ICD-10-CM

## 2023-10-09 DIAGNOSIS — Z7984 Long term (current) use of oral hypoglycemic drugs: Secondary | ICD-10-CM

## 2023-10-09 DIAGNOSIS — E1142 Type 2 diabetes mellitus with diabetic polyneuropathy: Secondary | ICD-10-CM

## 2023-10-09 DIAGNOSIS — G4709 Other insomnia: Secondary | ICD-10-CM

## 2023-10-09 MED ORDER — SEMAGLUTIDE(0.25 OR 0.5MG/DOS) 2 MG/3ML ~~LOC~~ SOPN: 0.2500 mg | PEN_INJECTOR | SUBCUTANEOUS | Status: DC

## 2023-10-09 MED ORDER — SEMAGLUTIDE(0.25 OR 0.5MG/DOS) 2 MG/3ML ~~LOC~~ SOPN
0.2500 mg | PEN_INJECTOR | SUBCUTANEOUS | 2 refills | Status: DC
Start: 2023-10-09 — End: 2024-01-12

## 2023-10-09 NOTE — Telephone Encounter (Signed)
 They want you to switch her medication to one below on that list.

## 2023-10-09 NOTE — Telephone Encounter (Signed)
 PA request has been Started. New Encounter has been or will be created for follow up. For additional info see Pharmacy Prior Auth telephone encounter from 10/09/2023.

## 2023-10-09 NOTE — Telephone Encounter (Signed)
 Noted.

## 2023-10-09 NOTE — Telephone Encounter (Signed)
 Patient needs a PA on Rybelsus

## 2023-10-09 NOTE — Progress Notes (Signed)
 Medication Samples have been provided to the patient.  Drug name: Ozempic        Strength: 2mg /73mL        Qty: 1 box  LOT: [id number]  Exp.Date: 03/16/2026  Dosing instructions: Inject 0.25 mg into the skin once a week.  The patient has been instructed regarding the correct time, dose, and frequency of taking this medication, including desired effects and most common side effects.   Shellee Devonshire 2:13 PM 10/09/2023

## 2023-10-09 NOTE — Progress Notes (Signed)
 Medication Samples have been provided to the patient.  Drug name: Jardiance        Strength: 25 mg        Qty: 2 boxes  LOT: [id number]  Exp.Date: 07/18/2025  Dosing instructions: Take 1 tablet per day.  The patient has been instructed regarding the correct time, dose, and frequency of taking this medication, including desired effects and most common side effects.   Shellee Devonshire 2:10 PM 10/09/2023

## 2023-10-09 NOTE — Telephone Encounter (Signed)
 Pharmacy Patient Advocate Encounter   Received notification from CoverMyMeds that prior authorization for Ozempic  (0.25 or 0.5 MG/DOSE) 2MG /3ML pen-injectors is required/requested.   Insurance verification completed.   The patient is insured through [employer] .   Per test claim: PA required; PA submitted to above mentioned insurance via CoverMyMeds Key/confirmation #/EOC [id number] Status is pending

## 2023-10-09 NOTE — Telephone Encounter (Signed)
 PA request has been Submitted. New Encounter has been or will be created for follow up. For additional info see Pharmacy Prior Auth telephone encounter from 10/09/2023.

## 2023-10-09 NOTE — Progress Notes (Signed)
 Established Patient Office Visit  Subjective:  Patient ID: Andrea [name], female    DOB: July 26, 1980  Age: 43 y.o. MRN: [id number]  CC:  Chief Complaint  Patient presents with   Medical Management of Chronic Issues    Blood sugar running High  AM 300's  PM 200's   Discussed the use of a AI scribe software for clinical note transcription with the patient, who gave verbal consent to proceed.  HPI Andrea [name] is a 43 year old female with diabetes who presents with elevated blood sugar levels.  She has been experiencing elevated blood sugar levels, with morning readings in the 300s and afternoon readings in the 200s. Her current medications include Rybelsus  and Jardiance .  A recent change in her insurance to Medicaid has led to issues with prior authorization for her medications.  Her blood sugar this morning was 323, and it was 158 at the time of the visit. She took her last Jardiance  this morning and has not eaten yet, only consuming water and Gatorade Zero. She feels really thirsty and urinates frequently. Her three-day glucose summary from her Dexcom shows an average of 195.  She experiences significant anxiety related to her health issues, particularly concerning the possibility of another surgery or amputation. She describes feeling shaking and crying and is seeking support through breathing exercises, meditation, and prayer. She is interested in counseling or talking therapy to help manage her anxiety.  She recently had to resign from her job due to an unresolved health issue and is now on [place]. No family history of thyroid or pancreatic cancer. No episodes of low blood pressure.  Lab Results  Component Value Date   HGBA1C 7.1 (H) 08/05/2023    HPI   Past Medical History:  Diagnosis Date   Anxiety    Asthma    Bipolar 1 disorder (HCC)    Bipolar 1 disorder, mixed, severe (HCC) 12/20/2016   Bipolar I disorder, most recent episode depressed (HCC) 12/20/2016    Complication of anesthesia    hard time waking up    Depression    Diabetes mellitus without complication (HCC)    Drug induced akathisia 04/02/2022   GERD (gastroesophageal reflux disease)    no meds   Hypertension    Kidney stones    Long term current use of antipsychotic medication 03/13/2022   Low back pain    Migraines    PCOS (polycystic ovarian syndrome)    PONV (postoperative nausea and vomiting)    Schizophrenia (HCC)    Seizure (HCC) 10/03/2021   Seizures (HCC) 06/04/2016   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Gwen Lek 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   Shortness of breath dyspnea    with bronchitis    Past Surgical History:  Procedure Laterality Date   ABDOMINAL HYSTERECTOMY N/A 11/14/2015   Procedure: HYSTERECTOMY ABDOMINAL;  Surgeon: Hamp Levine, MD;  Location: [facility];  Service: Gynecology;  Laterality: N/A;   ANKLE FUSION Right 05/13/2023   Procedure: ARTHRODESIS ANKLE;  Surgeon: Evertt Hoe, DPM;  Location: [facility];  Service: Orthopedics/Podiatry;  Laterality: Right;  Hardware removal, right ankle tibiot talo calcaneal arthrodesis   CHOLECYSTECTOMY     INTRAUTERINE DEVICE (IUD) INSERTION  06/14/2014   [facility]   LYMPH NODES REMOVED     ORIF ANKLE FRACTURE Right 05/03/2023   Procedure: OPEN REDUCTION INTERNAL FIXATION (ORIF) ANKLE FRACTURE;  Surgeon: Floyce Hutching, DPM;  Location: [facility];  Service: Orthopedics/Podiatry;  Laterality: Right;   ovarian cyst removed  SALPINGOOPHORECTOMY Bilateral 11/14/2015   Procedure: SALPINGO OOPHORECTOMY;  Surgeon: Hamp Levine, MD;  Location: [facility];  Service: Gynecology;  Laterality: Bilateral;    Family History  Problem Relation Age of Onset   Miscarriages / Stillbirths Mother    Hypertension Mother    Heart disease Mother    Hypertension Father    Healthy Father    Healthy Sister    Healthy Brother     Social History   Socioeconomic History   Marital status: Divorced    Spouse name: Not on file    Number of children: 0   Years of education: 12   Highest education level: Some college, no degree  Occupational History   Not on file  Tobacco Use   Smoking status: Never   Smokeless tobacco: Never  Vaping Use   Vaping status: Never Used  Substance and Sexual Activity   Alcohol  use: Never   Drug use: Never   Sexual activity: Yes    Partners: Male  Other Topics Concern   Not on file  Social History Narrative   Right handed   Drinks caffeine   One story home   Social Drivers of Health   Financial Resource Strain: Medium Risk (06/23/2023)   Overall Financial Resource Strain (CARDIA)    Difficulty of Paying Living Expenses: Somewhat hard  Food Insecurity: Food Insecurity Present (06/23/2023)   Hunger Vital Sign    Worried About Running Out of Food in the Last Year: Sometimes true    Ran Out of Food in the Last Year: Sometimes true  Transportation Needs: No Transportation Needs (06/23/2023)   PRAPARE - Administrator, Civil Service (Medical): No    Lack of Transportation (Non-Medical): No  Physical Activity: Unknown (06/23/2023)   Exercise Vital Sign    Days of Exercise per Week: 0 days    Minutes of Exercise per Session: Not on file  Stress: Stress Concern Present (06/23/2023)   [employer] of Occupational Health - Occupational Stress Questionnaire    Feeling of Stress : Very much  Social Connections: Socially Integrated (06/23/2023)   Social Connection and Isolation Panel [NHANES]    Frequency of Communication with Friends and Family: More than three times a week    Frequency of Social Gatherings with Friends and Family: Once a week    Attends Religious Services: More than 4 times per year    Active Member of [employer] or Organizations: Yes    Attends Banker Meetings: More than 4 times per year    Marital Status: Living with partner  Intimate Partner Violence: Not At Risk (05/12/2023)   Humiliation, Afraid, Rape, and Kick questionnaire    Fear of  Current or Ex-Partner: No    Emotionally Abused: No    Physically Abused: No    Sexually Abused: No     Outpatient Medications Prior to Visit  Medication Sig Dispense Refill   albuterol  (PROVENTIL ) (2.5 MG/3ML) 0.083% nebulizer solution Take 2.5 mg by nebulization every 4 (four) hours as needed.     Albuterol -Budesonide  (AIRSUPRA) 90-80 MCG/ACT AERO      amoxicillin -clavulanate (AUGMENTIN ) 875-125 MG tablet Take 1 tablet by mouth 2 (two) times daily. 20 tablet 0   Atogepant  (QULIPTA ) 30 MG TABS Take 1 tablet daily 30 tablet 5   eletriptan  (RELPAX ) 40 MG tablet Take 1 tablet at onset of migraine. May repeat in 2 hours if headache persists or recurs. Do not take more than 3 a week 10 tablet 5  famotidine  (PEPCID ) 40 MG tablet Take 1 tablet (40 mg total) by mouth 2 (two) times daily. 180 tablet 0   gabapentin  (NEURONTIN ) 600 MG tablet TAKE 1 TABLET BY MOUTH THREE TIMES A DAY 90 tablet 0   Galcanezumab -gnlm (EMGALITY ) 120 MG/ML SOAJ Inject 1 Pen into the skin every 30 (thirty) days. 1.12 mL 11   GEMTESA 75 MG TABS Take 1 tablet by mouth daily.     hydrOXYzine  (VISTARIL ) 25 MG capsule Take 1 capsule (25 mg total) by mouth every 8 (eight) hours as needed. 30 capsule 0   ibuprofen  (ADVIL ) 800 MG tablet TAKE 1 TABLET BY MOUTH EVERY 8 HOURS AS NEEDED 30 tablet 1   montelukast  (SINGULAIR ) 10 MG tablet TAKE 1 TABLET BY MOUTH EVERYDAY AT BEDTIME 90 tablet 0   ondansetron  (ZOFRAN -ODT) 4 MG disintegrating tablet Take 2 tablets (8 mg total) by mouth every 8 (eight) hours as needed for nausea or vomiting. 30 tablet 0   pantoprazole  (PROTONIX ) 40 MG tablet Take 1 tablet (40 mg total) by mouth every morning. 90 tablet 1   potassium chloride  (MICRO-K ) 10 MEQ CR capsule Take 10 mEq by mouth daily.     SYMBICORT 160-4.5 MCG/ACT inhaler Inhale 2 puffs into the lungs.     tiZANidine  (ZANAFLEX ) 4 MG tablet Take 4 mg by mouth every 8 (eight) hours as needed.     traZODone  (DESYREL ) 150 MG tablet Take 1 tablet (150  mg total) by mouth at bedtime. 90 tablet 1   Vitamin D , Ergocalciferol , (DRISDOL ) 1.25 MG (50000 UNIT) CAPS capsule TAKE 1 CAPSULE (50,000 UNITS TOTAL) BY MOUTH EVERY 7 (SEVEN) DAYS 8 capsule 0   zonisamide  (ZONEGRAN ) 100 MG capsule Take 5 capsules every night 150 capsule 11   DULoxetine  (CYMBALTA ) 60 MG capsule Take 2 capsules (120 mg total) by mouth daily. 180 capsule 0   ELIQUIS  2.5 MG TABS tablet TAKE 1 TABLET BY MOUTH TWICE A DAY 60 tablet 0   JARDIANCE  25 MG TABS tablet Take 25 mg by mouth every morning.     oxyCODONE -acetaminophen  (PERCOCET/ROXICET) 5-325 MG tablet Take 1 tablet by mouth every 4 (four) hours as needed for up to 5 days for severe pain (pain score 7-10). 20 tablet 0   QUEtiapine  (SEROQUEL ) 50 MG tablet Take 1 tablet (50 mg total) by mouth 3 (three) times daily. 90 tablet 2   RYBELSUS  14 MG TABS Take 1 tablet (14 mg total) by mouth daily. 90 tablet 0   No facility-administered medications prior to visit.    Allergies  Allergen Reactions   Bactrim [Sulfamethoxazole -Trimethoprim] Hives and Itching   Codeine Hives and Itching    ROS Review of Systems Negative unless indicated in HPI.    Objective:    Physical Exam  BP 124/84   Pulse 72   Temp (!) 97.5 F (36.4 C)   Ht 5\' 11"  (1.803 m)   Wt 295 lb 3.2 oz (133.9 kg)   LMP  (LMP Unknown)   SpO2 95%   BMI 41.17 kg/m  Wt Readings from Last 3 Encounters:  10/09/23 295 lb 3.2 oz (133.9 kg)  08/07/23 290 lb (131.5 kg)  08/04/23 290 lb (131.5 kg)     Health Maintenance  Topic Date Due   COVID-19 Vaccine (1) Never done   FOOT EXAM  Never done   OPHTHALMOLOGY EXAM  Never done   Hepatitis C Screening  Never done   Pneumococcal Vaccine 37-14 Years old (1 of 2 - PCV) Never done  DTaP/Tdap/Td (2 - Tdap) 02/16/2015   Cervical Cancer Screening (HPV/Pap Cotest)  03/27/2015   INFLUENZA VACCINE  01/16/2024   HEMOGLOBIN A1C  02/02/2024   Diabetic kidney evaluation - Urine ACR  06/25/2024   Diabetic kidney  evaluation - eGFR measurement  07/18/2024   HIV Screening  Completed   HPV VACCINES  Aged Out   Meningococcal B Vaccine  Aged Out    There are no preventive care reminders to display for this patient.  Lab Results  Component Value Date   TSH 2.132 07/31/2021   Lab Results  Component Value Date   WBC 9.3 07/19/2023   HGB 11.2 (L) 07/19/2023   HCT 35.6 (L) 07/19/2023   MCV 90.8 07/19/2023   PLT 228 07/19/2023   Lab Results  Component Value Date   NA 138 07/19/2023   K 3.7 07/19/2023   CO2 23 07/19/2023   GLUCOSE 241 (H) 07/19/2023   BUN 22 (H) 07/19/2023   CREATININE 0.88 07/19/2023   BILITOT 0.3 07/19/2023   ALKPHOS 90 07/19/2023   AST 13 (L) 07/19/2023   ALT 13 07/19/2023   PROT 6.1 (L) 07/19/2023   ALBUMIN 3.5 07/19/2023   CALCIUM 9.1 07/19/2023   ANIONGAP 14 07/19/2023   GFR 89.28 06/26/2023   Lab Results  Component Value Date   CHOL 238 (H) 06/26/2023   Lab Results  Component Value Date   HDL 46.70 06/26/2023   Lab Results  Component Value Date   LDLCALC 149 (H) 06/26/2023   Lab Results  Component Value Date   TRIG 209.0 (H) 06/26/2023   Lab Results  Component Value Date   CHOLHDL 5 06/26/2023   Lab Results  Component Value Date   HGBA1C 7.1 (H) 08/05/2023      Assessment & Plan:  Type 2 diabetes mellitus with peripheral neuropathy (HCC) Assessment & Plan: Morning glucose levels in 300s, afternoon in 200s. Current medications: Rybelsus , Jardiance . Insurance change to Medicaid requires prior authorization. Symptoms: polydipsia, polyuria. Dexcom 3-day average: 195 mg/dL. Discussed transition to injectable GLP-1 receptor agonists. No contraindications for GLP-1 therapy. - Provide Ozempic  samples for one month and Jardiance  for two weeks. - Instruct on Ozempic  administration, weekly injection, and guide on selecting an administration day. - Advise to send a MyChart message if prior authorization is not received within two weeks for Jardiance . -  Check for additional Jardiance  samples if needed. - Monitor blood glucose levels using Dexcom.   Orders: -     Semaglutide (0.25 or 0.5MG /DOS); Inject 0.25 mg into the skin once a week.  Dispense: 3 mL; Refill: 2  Generalized anxiety disorder with panic attacks Assessment & Plan: Exacerbated by stress of potential surgical interventions. Significant anxiety symptoms despite coping strategies. Interested in counseling or talking therapy. - Advise to contact the therapist's office to arrange an appointment for therapy.   Other orders -     Semaglutide (0.25 or 0.5MG /DOS); Inject 0.25 mg into the skin once a week.    Follow-up: No follow-ups on file.   Aaliya Maultsby, NP

## 2023-10-09 NOTE — Telephone Encounter (Signed)
 Prescription of ozempic  has been sent. Does she needs PA on Jardiance  as well?

## 2023-10-09 NOTE — Telephone Encounter (Signed)
 Patient needs a PA on Jardiance  sorry disregard the Rybelsus  due to it has already been done.

## 2023-10-09 NOTE — Telephone Encounter (Signed)
 Pharmacy Patient Advocate Encounter   Received notification from Patient Advice Request messages that prior authorization for Jardiance  25MG  tablets is required/requested.   Insurance verification completed.   The patient is insured through [employer] [place] .   Per test claim: PA required; PA submitted to above mentioned insurance via CoverMyMeds Key/confirmation #/EOC [id number] Status is pending

## 2023-10-09 NOTE — Assessment & Plan Note (Addendum)
 Morning glucose levels in 300s, afternoon in 200s. Current medications: Rybelsus , Jardiance . Insurance change to Medicaid requires prior authorization. Symptoms: polydipsia, polyuria. Dexcom 3-day average: 195 mg/dL. Discussed transition to injectable GLP-1 receptor agonists. No contraindications for GLP-1 therapy. - Provide Ozempic  samples for one month and Jardiance  for two weeks. - Instruct on Ozempic  administration, weekly injection, and guide on selecting an administration day. - Advise to send a MyChart message if prior authorization is not received within two weeks for Jardiance . - Check for additional Jardiance  samples if needed. - Monitor blood glucose levels using Dexcom.

## 2023-10-10 ENCOUNTER — Other Ambulatory Visit (HOSPITAL_COMMUNITY): Payer: Self-pay

## 2023-10-10 ENCOUNTER — Telehealth: Payer: MEDICAID | Admitting: Family Medicine

## 2023-10-10 DIAGNOSIS — R21 Rash and other nonspecific skin eruption: Secondary | ICD-10-CM

## 2023-10-10 MED ORDER — PREDNISONE 20 MG PO TABS: 20.0000 mg | ORAL_TABLET | Freq: Two times a day (BID) | ORAL | 0 refills | Status: AC

## 2023-10-10 NOTE — Telephone Encounter (Signed)
 Pharmacy Patient Advocate Encounter  Received notification from [facility] that Prior Authorization for Ozempic  (0.25 or 0.5 MG/DOSE) 2MG /3ML pen-injectors has been APPROVED from 10/10/23 to 10/09/24. Ran test claim, Copay is $4. This test claim was processed through [facility]- copay amounts may vary at other pharmacies due to pharmacy/plan contracts, or as the patient moves through the different stages of their insurance plan.   PA #/Case ID/Reference #: [id number]

## 2023-10-10 NOTE — Progress Notes (Signed)
 E Visit for Rash  We are sorry that you are not feeling well. Here is how we plan to help!  I will send prednisone . DWB  Continue use of hydrocortisone .    HOME CARE:  Take cool showers and avoid direct sunlight. Apply cool compress or wet dressings. Take a bath in an oatmeal bath.  Sprinkle content of one Aveeno packet under running faucet with comfortably warm water.  Bathe for 15-20 minutes, 1-2 times daily.  Pat dry with a towel. Do not rub the rash. Use hydrocortisone  cream. Take an antihistamine like Benadryl  for widespread rashes that itch.  The adult dose of Benadryl  is 25-50 mg by mouth 4 times daily. Caution:  This type of medication may cause sleepiness.  Do not drink alcohol , drive, or operate dangerous machinery while taking antihistamines.  Do not take these medications if you have prostate enlargement.  Read package instructions thoroughly on all medications that you take.  GET HELP RIGHT AWAY IF:  Symptoms don't go away after treatment. Severe itching that persists. If you rash spreads or swells. If you rash begins to smell. If it blisters and opens or develops a yellow-brown crust. You develop a fever. You have a sore throat. You become short of breath.  MAKE SURE YOU:  Understand these instructions. Will watch your condition. Will get help right away if you are not doing well or get worse.  Thank you for choosing an e-visit.  Your e-visit answers were reviewed by a board certified advanced clinical practitioner to complete your personal care plan. Depending upon the condition, your plan could have included both over the counter or prescription medications.  Please review your pharmacy choice. Make sure the pharmacy is open so you can pick up prescription now. If there is a problem, you may contact your provider through [employer] and have the prescription routed to another pharmacy.  Your safety is important to us . If you have drug allergies check your  prescription carefully.   For the next 24 hours you can use MyChart to ask questions about today's visit, request a non-urgent call back, or ask for a work or school excuse. You will get an email in the next two days asking about your experience. I hope that your e-visit has been valuable and will speed your recovery.    have provided 5 minutes of non face to face time during this encounter for chart review and documentation.

## 2023-10-13 ENCOUNTER — Other Ambulatory Visit: Payer: Self-pay | Admitting: Podiatry

## 2023-10-13 ENCOUNTER — Encounter: Payer: Self-pay | Admitting: Nurse Practitioner

## 2023-10-13 ENCOUNTER — Encounter (HOSPITAL_COMMUNITY): Payer: Self-pay

## 2023-10-13 ENCOUNTER — Telehealth (INDEPENDENT_AMBULATORY_CARE_PROVIDER_SITE_OTHER): Payer: MEDICAID | Admitting: Psychiatry

## 2023-10-13 DIAGNOSIS — Z91199 Patient's noncompliance with other medical treatment and regimen due to unspecified reason: Secondary | ICD-10-CM

## 2023-10-13 DIAGNOSIS — F411 Generalized anxiety disorder: Secondary | ICD-10-CM

## 2023-10-13 DIAGNOSIS — F410 Panic disorder [episodic paroxysmal anxiety] without agoraphobia: Secondary | ICD-10-CM

## 2023-10-13 DIAGNOSIS — G894 Chronic pain syndrome: Secondary | ICD-10-CM

## 2023-10-13 DIAGNOSIS — F4310 Post-traumatic stress disorder, unspecified: Secondary | ICD-10-CM

## 2023-10-13 MED ORDER — JARDIANCE 25 MG PO TABS: 25.0000 mg | ORAL_TABLET | Freq: Every morning | ORAL | 3 refills | Status: DC

## 2023-10-13 MED ORDER — DULOXETINE HCL 60 MG PO CPEP
120.0000 mg | ORAL_CAPSULE | Freq: Every day | ORAL | 0 refills | Status: AC
Start: 2023-10-13 — End: ?

## 2023-10-13 MED ORDER — OXYCODONE-ACETAMINOPHEN 5-325 MG PO TABS: 1.0000 | ORAL_TABLET | ORAL | 0 refills | Status: DC | PRN

## 2023-10-13 NOTE — Telephone Encounter (Signed)
 Pt requesting refill. Previously filled by  Darrin Emerald, MD. Medication has been pended

## 2023-10-13 NOTE — Telephone Encounter (Signed)
 Pharmacy Patient Advocate Encounter  Received notification from [employer] that Prior Authorization for Jardiance  has been APPROVED from 10/10/2023 to 10/09/2024   PA #/Case ID/Reference #: [id number]

## 2023-10-13 NOTE — Progress Notes (Signed)
 Patient no show.  Link sent to number provided but patient never arrived in virtual space.  Due to this being the third no-show this exceeds clinic policy and dismissal letter was sent.

## 2023-10-17 ENCOUNTER — Ambulatory Visit (HOSPITAL_COMMUNITY)
Admission: RE | Admit: 2023-10-17 | Discharge: 2023-10-17 | Disposition: A | Discharge status: Home / Self Care | Payer: MEDICAID | Source: Ambulatory Visit | Attending: Podiatry

## 2023-10-17 ENCOUNTER — Other Ambulatory Visit: Payer: Self-pay | Admitting: Podiatry

## 2023-10-17 DIAGNOSIS — M9689 Other intraoperative and postprocedural complications and disorders of the musculoskeletal system: Secondary | ICD-10-CM | POA: Diagnosis present

## 2023-10-17 LAB — VAS US ABI WITH/WO TBI
Left ABI: 1.08
Right ABI: 1.13

## 2023-10-18 ENCOUNTER — Other Ambulatory Visit: Payer: Self-pay | Admitting: Podiatry

## 2023-10-20 MED ORDER — OXYCODONE-ACETAMINOPHEN 5-325 MG PO TABS: 1.0000 | ORAL_TABLET | ORAL | 0 refills | Status: DC | PRN

## 2023-10-22 NOTE — Assessment & Plan Note (Signed)
 Exacerbated by stress of potential surgical interventions. Significant anxiety symptoms despite coping strategies. Interested in counseling or talking therapy. - Advise to contact the therapist's office to arrange an appointment for therapy.

## 2023-10-23 ENCOUNTER — Encounter: Payer: Self-pay | Admitting: Podiatry

## 2023-10-23 ENCOUNTER — Other Ambulatory Visit: Payer: Self-pay | Admitting: Podiatry

## 2023-10-23 DIAGNOSIS — S82852A Displaced trimalleolar fracture of left lower leg, initial encounter for closed fracture: Secondary | ICD-10-CM | POA: Insufficient documentation

## 2023-10-23 MED ORDER — OXYCODONE-ACETAMINOPHEN 5-325 MG PO TABS: 1.0000 | ORAL_TABLET | ORAL | 0 refills | Status: DC | PRN

## 2023-10-26 ENCOUNTER — Encounter: Payer: Self-pay | Admitting: Podiatry

## 2023-10-27 ENCOUNTER — Other Ambulatory Visit: Payer: Self-pay | Admitting: Podiatry

## 2023-10-27 MED ORDER — OXYCODONE-ACETAMINOPHEN 5-325 MG PO TABS
1.0000 | ORAL_TABLET | ORAL | 0 refills | Status: DC | PRN
Start: 2023-10-27 — End: 2023-10-27

## 2023-10-27 MED ORDER — OXYCODONE-ACETAMINOPHEN 5-325 MG PO TABS: 1.0000 | ORAL_TABLET | ORAL | 0 refills | Status: AC | PRN

## 2023-10-29 ENCOUNTER — Telehealth: Payer: MEDICAID | Admitting: Physician Assistant

## 2023-10-29 DIAGNOSIS — B372 Candidiasis of skin and nail: Secondary | ICD-10-CM | POA: Diagnosis not present

## 2023-10-29 MED ORDER — NYSTATIN 100000 UNIT/GM EX CREA: 1.0000 | TOPICAL_CREAM | Freq: Two times a day (BID) | CUTANEOUS | 0 refills | Status: DC

## 2023-10-29 NOTE — Progress Notes (Signed)
 I have spent 5 minutes in review of e-visit questionnaire, review and updating patient chart, medical decision making and response to patient.   Piedad Climes, PA-C

## 2023-10-29 NOTE — Progress Notes (Signed)
 E Visit for Rash  We are sorry that you are not feeling well. Here is how we plan to help!  Based upon your presentation it appears you have a fungal/yeast infection of the skin as it is common in the areas of concern you mentioned.  I have prescribed: and Nystatin  cream apply to the affected area twice daily   HOME CARE:  Take cool showers and avoid direct sunlight. Apply cool compress or wet dressings. Take a bath in an oatmeal bath.  Sprinkle content of one Aveeno packet under running faucet with comfortably warm water.  Bathe for 15-20 minutes, 1-2 times daily.  Pat dry with a towel. Do not rub the rash. Use hydrocortisone  cream. Take an antihistamine like Benadryl  for widespread rashes that itch.  The adult dose of Benadryl  is 25-50 mg by mouth 4 times daily. Caution:  This type of medication may cause sleepiness.  Do not drink alcohol , drive, or operate dangerous machinery while taking antihistamines.  Do not take these medications if you have prostate enlargement.  Read package instructions thoroughly on all medications that you take.  GET HELP RIGHT AWAY IF:  Symptoms don't go away after treatment. Severe itching that persists. If you rash spreads or swells. If you rash begins to smell. If it blisters and opens or develops a yellow-brown crust. You develop a fever. You have a sore throat. You become short of breath.  MAKE SURE YOU:  Understand these instructions. Will watch your condition. Will get help right away if you are not doing well or get worse.  Thank you for choosing an e-visit.  Your e-visit answers were reviewed by a board certified advanced clinical practitioner to complete your personal care plan. Depending upon the condition, your plan could have included both over the counter or prescription medications.  Please review your pharmacy choice. Make sure the pharmacy is open so you can pick up prescription now. If there is a problem, you may contact your  provider through [employer] and have the prescription routed to another pharmacy.  Your safety is important to us . If you have drug allergies check your prescription carefully.   For the next 24 hours you can use MyChart to ask questions about today's visit, request a non-urgent call back, or ask for a work or school excuse. You will get an email in the next two days asking about your experience. I hope that your e-visit has been valuable and will speed your recovery.

## 2023-10-31 ENCOUNTER — Other Ambulatory Visit: Payer: Self-pay | Admitting: Podiatry

## 2023-10-31 ENCOUNTER — Encounter: Payer: Self-pay | Admitting: Podiatry

## 2023-10-31 ENCOUNTER — Ambulatory Visit: Payer: MEDICAID | Admitting: Podiatry

## 2023-10-31 ENCOUNTER — Ambulatory Visit (INDEPENDENT_AMBULATORY_CARE_PROVIDER_SITE_OTHER): Payer: MEDICAID

## 2023-10-31 DIAGNOSIS — M960 Pseudarthrosis after fusion or arthrodesis: Secondary | ICD-10-CM | POA: Diagnosis not present

## 2023-10-31 DIAGNOSIS — M216X1 Other acquired deformities of right foot: Secondary | ICD-10-CM | POA: Diagnosis not present

## 2023-10-31 DIAGNOSIS — M9689 Other intraoperative and postprocedural complications and disorders of the musculoskeletal system: Secondary | ICD-10-CM

## 2023-10-31 NOTE — Progress Notes (Signed)
 Chief Complaint  Patient presents with   Post-op Problem    "It's not good."    HPI: 43 y.o. female presenting today for follow-up evaluation of pain and tenderness associated to the right lower extremity.  History of trimalleolar ankle fracture with ORIF on 05/03/2023.  Subsequent removal of hardware with TTC arthrodesis on 05/13/2023.  Presenting for second opinion.  Past Medical History:  Diagnosis Date   Anxiety    Asthma    Bipolar 1 disorder (HCC)    Bipolar 1 disorder, mixed, severe (HCC) 12/20/2016   Bipolar I disorder, most recent episode depressed (HCC) 12/20/2016   Complication of anesthesia    hard time waking up    Depression    Diabetes mellitus without complication (HCC)    Drug induced akathisia 04/02/2022   GERD (gastroesophageal reflux disease)    no meds   Hypertension    Kidney stones    Long term current use of antipsychotic medication 03/13/2022   Low back pain    Migraines    PCOS (polycystic ovarian syndrome)    PONV (postoperative nausea and vomiting)    Schizophrenia (HCC)    Seizure (HCC) 10/03/2021   Seizures (HCC) 06/04/2016   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Gwen Lek 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   Shortness of breath dyspnea    with bronchitis    Past Surgical History:  Procedure Laterality Date   ABDOMINAL HYSTERECTOMY N/A 11/14/2015   Procedure: HYSTERECTOMY ABDOMINAL;  Surgeon: Hamp Levine, MD;  Location: [facility];  Service: Gynecology;  Laterality: N/A;   ANKLE FUSION Right 05/13/2023   Procedure: ARTHRODESIS ANKLE;  Surgeon: Evertt Hoe, DPM;  Location: [facility];  Service: Orthopedics/Podiatry;  Laterality: Right;  Hardware removal, right ankle tibiot talo calcaneal arthrodesis   CHOLECYSTECTOMY     INTRAUTERINE DEVICE (IUD) INSERTION  06/14/2014   [facility]   LYMPH NODES REMOVED     ORIF ANKLE FRACTURE Right 05/03/2023   Procedure: OPEN REDUCTION INTERNAL FIXATION (ORIF) ANKLE FRACTURE;  Surgeon: Floyce Hutching, DPM;   Location: [facility];  Service: Orthopedics/Podiatry;  Laterality: Right;   ovarian cyst removed     SALPINGOOPHORECTOMY Bilateral 11/14/2015   Procedure: SALPINGO OOPHORECTOMY;  Surgeon: Hamp Levine, MD;  Location: [facility];  Service: Gynecology;  Laterality: Bilateral;    Allergies  Allergen Reactions   Bactrim [Sulfamethoxazole -Trimethoprim] Hives and Itching   Codeine Hives and Itching     Physical Exam: General: The patient is alert and oriented x3 in no acute distress.  Dermatology: Skin is warm, dry and supple bilateral lower extremities.   Vascular: Palpable pedal pulses bilaterally. Capillary refill within normal limits.  No appreciable edema.  No erythema.  Neurological: Grossly intact via light touch  Musculoskeletal Exam: Clinically the foot is in rectus alignment.  No crepitus or motion to the ankle or subtalar joint.  Diffuse pain throughout the rearfoot and ankle  Radiographic Exam RT ankle 10/31/2023:  TTC rod noted with screw fixation.  It appears to be stable.  No obvious radiolucency around the hardware.  No acute fractures.  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  Assessment/Plan of Care: 1. H/o ORIF RT ankle fracture. [facility]. DOS: 05/03/2023. Dr. Jeni Mitten 2. PSxHx [facility] w/ TTC arthrodesis RT ankle. [facility]. DOS: 05/13/2023. Dr. Barry Boring  -Patient evaluated.  Chart and imaging reviewed today. -For now I would not recommend any surgical intervention.  Overall the hardware is in decent alignment.  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 -External bone stimulator pending approval.  Would recommend utilizing the bone stimulator once received for indicated 6-9 months prior to any additional surgical intervention -Continue minimal WBAT.  Patient currently using a cam boot -Return to clinic 3 months follow-up x-ray       Dot Gazella, DPM [facility]  Dr. Dot Gazella, DPM    [address] [place], [place] [address]                Office [phone]  Fax [phone]

## 2023-11-03 ENCOUNTER — Encounter: Payer: Self-pay | Admitting: Podiatry

## 2023-11-03 ENCOUNTER — Other Ambulatory Visit: Payer: Self-pay | Admitting: Podiatry

## 2023-11-03 MED ORDER — OXYCODONE-ACETAMINOPHEN 5-325 MG PO TABS: 1.0000 | ORAL_TABLET | ORAL | 0 refills | Status: AC | PRN

## 2023-11-03 NOTE — Progress Notes (Signed)
Refill of percocet sent.

## 2023-11-04 ENCOUNTER — Telehealth: Payer: Self-pay

## 2023-11-04 ENCOUNTER — Other Ambulatory Visit: Payer: Self-pay | Admitting: Podiatry

## 2023-11-04 ENCOUNTER — Other Ambulatory Visit: Payer: Self-pay

## 2023-11-04 DIAGNOSIS — M9689 Other intraoperative and postprocedural complications and disorders of the musculoskeletal system: Secondary | ICD-10-CM

## 2023-11-04 DIAGNOSIS — M960 Pseudarthrosis after fusion or arthrodesis: Secondary | ICD-10-CM

## 2023-11-04 DIAGNOSIS — Z9889 Other specified postprocedural states: Secondary | ICD-10-CM

## 2023-11-04 NOTE — Telephone Encounter (Signed)
 PA request received from covermymeds for Oxycodone -Acetaminophen  5/325mg  tablets. PA submitted and waiting on response.  Andrea [name]  (KeyRozella Cornfield) PA Case ID #: [id number] Rx #: [phone]

## 2023-11-07 ENCOUNTER — Telehealth: Payer: MEDICAID | Admitting: Physician Assistant

## 2023-11-07 DIAGNOSIS — N898 Other specified noninflammatory disorders of vagina: Secondary | ICD-10-CM

## 2023-11-07 NOTE — Telephone Encounter (Signed)
 PA was approved 11/06/2023-05/08/2024

## 2023-11-07 NOTE — Progress Notes (Signed)
   Thank you for the details you included in the comment boxes. Those details are very helpful in determining the best course of treatment for you and help [place] to provide the best care.Because of your symptoms, we recommend that you schedule a Virtual Urgent Care video visit in order for the provider to better assess what is going on.  The provider will be able to give you a more accurate diagnosis and treatment plan if we can more freely discuss your symptoms and with the addition of a virtual examination.   If you change your visit to a video visit, we will bill your insurance (similar to an office visit) and you will not be charged for this e-Visit. You will be able to stay at home and speak with the first available [facility] advanced practice provider. The link to do a video visit is in the drop down Menu tab of your Welcome screen in MyChart.

## 2023-11-10 ENCOUNTER — Telehealth: Payer: MEDICAID | Admitting: Physician Assistant

## 2023-11-10 ENCOUNTER — Encounter: Payer: Self-pay | Admitting: Podiatry

## 2023-11-10 DIAGNOSIS — H60391 Other infective otitis externa, right ear: Secondary | ICD-10-CM

## 2023-11-10 MED ORDER — CIPROFLOXACIN-DEXAMETHASONE 0.3-0.1 % OT SUSP: 4.0000 [drp] | Freq: Two times a day (BID) | OTIC | 0 refills | Status: AC

## 2023-11-10 NOTE — Progress Notes (Signed)
 E Visit for Ear Pain - Otitis Externa  We are sorry that you are not feeling well. Here is how we plan to help!  Based on what you have shared with me it looks like you have Otitis Externa.  Otitis externa is a redness or swelling, irritation, or infection of your outer ear canal. Your ear canal is a tube that goes from the opening of the ear to the eardrum.  When water stays in your ear canal, germs can grow.  This is a painful condition that often happens to children and swimmers of all ages.  It is not contagious and oral antibiotics are not required to treat uncomplicated swimmer's ear.  The usual symptoms include:    Itchiness inside the ear  Redness or a sense of swelling in the ear  Fullness of the ear Muffled hearing Pain when the ear is tugged on when pressure is placed on the ear  Pus draining from the infected ear   I have prescribed: Ciprofloxacin  0.3% and dexamethasone  0.1% otic suspension four drops in affected ears two times a day for 7 days  In certain cases, swimmer's ear may progress to a more serious bacterial infection of the middle or inner ear.  If you have a fever 102 and up and significantly worsening symptoms, this could indicate a more serious infection moving to the middle/inner and needs face to face evaluation in an office by a provider.  Your symptoms should improve over the next 3 days and should resolve in about 7 days.  Be sure to complete ALL of your prescription.  HOME CARE: Wash your hands frequently. If you are prescribed an ear drop, do not place the tip of the bottle on your ear or touch it with your fingers. You can take Acetaminophen  650 mg every 4-6 hours as needed for pain.  If pain is severe or moderate, you can apply a heating pad (set on low) or hot water bottle (wrapped in a towel) to outer ear for 20 minutes.  This will also increase drainage. Avoid ear plugs Do not go swimming until the symptoms are gone Do not use Q-tips After showers, help  the water run out by tilting your head to one side.   GET HELP RIGHT AWAY IF: Fever is over 102.2 degrees. You develop progressive ear pain or hearing loss. Ear symptoms persist longer than 3 days after treatment.  MAKE SURE YOU: Understand these instructions. Will watch your condition. Will get help right away if you are not doing well or get worse.  TO PREVENT SWIMMER'S EAR: Use a bathing cap or custom fitted swim molds to keep your ears dry. Towel off after swimming to dry your ears. Tilt your head or pull your earlobes to allow the water to escape your ear canal. If there is still water in your ears, consider using a hairdryer on the lowest setting.  Thank you for choosing an e-visit.  Your e-visit answers were reviewed by a board certified advanced clinical practitioner to complete your personal care plan. Depending upon the condition, your plan could have included both over the counter or prescription medications.  Please review your pharmacy choice. Make sure the pharmacy is open so you can pick up the prescription now. If there is a problem, you may contact your provider through [employer] and have the prescription routed to another pharmacy.  Your safety is important to us . If you have drug allergies check your prescription carefully.   For the  next 24 hours you can use MyChart to ask questions about today's visit, request a non-urgent call back, or ask for a work or school excuse. You will get an email with a survey after your eVisit asking about your experience. We would appreciate your feedback. I hope that your e-visit has been valuable and will aid in your recovery.   I have spent 5 minutes in review of e-visit questionnaire, review and updating patient chart, medical decision making and response to patient.   Angelia Kelp, PA-C

## 2023-11-11 ENCOUNTER — Other Ambulatory Visit: Payer: Self-pay | Admitting: Podiatry

## 2023-11-11 MED ORDER — OXYCODONE-ACETAMINOPHEN 5-325 MG PO TABS: 1.0000 | ORAL_TABLET | ORAL | 0 refills | Status: AC | PRN

## 2023-11-11 NOTE — Progress Notes (Signed)
Refill of percocet sent.

## 2023-11-13 ENCOUNTER — Telehealth: Payer: Self-pay | Admitting: Podiatry

## 2023-11-13 NOTE — Telephone Encounter (Signed)
 Patient is following up on referral for Pain Management doctor.

## 2023-11-17 ENCOUNTER — Other Ambulatory Visit: Payer: Self-pay | Admitting: Podiatry

## 2023-11-17 DIAGNOSIS — Z8781 Personal history of (healed) traumatic fracture: Secondary | ICD-10-CM

## 2023-11-17 DIAGNOSIS — M9689 Other intraoperative and postprocedural complications and disorders of the musculoskeletal system: Secondary | ICD-10-CM

## 2023-11-17 DIAGNOSIS — M79671 Pain in right foot: Secondary | ICD-10-CM

## 2023-11-18 ENCOUNTER — Other Ambulatory Visit: Payer: Self-pay | Admitting: Podiatry

## 2023-11-18 ENCOUNTER — Encounter: Payer: Self-pay | Admitting: Podiatry

## 2023-11-18 MED ORDER — OXYCODONE-ACETAMINOPHEN 5-325 MG PO TABS: 1.0000 | ORAL_TABLET | ORAL | 0 refills | Status: AC | PRN

## 2023-11-18 NOTE — Progress Notes (Signed)
Refill of percocet sent.

## 2023-11-19 ENCOUNTER — Encounter: Payer: Self-pay | Admitting: Podiatry

## 2023-11-20 ENCOUNTER — Telehealth: Payer: Self-pay

## 2023-11-20 NOTE — Telephone Encounter (Signed)
 Copied from CRM [id number]. Topic: Appointments - Appointment Scheduling >> Nov 20, 2023  2:47 PM Allyne Areola wrote: Patient/patient representative is calling to schedule an appointment. Refer to attachments for appointment information. Patient is calling to schedule an acute visit for tomorrow due to experiencing hand tremors on and off. Advised that the next available date is 11/25/2023; however, she would like to know if anyone can fit her in tomorrow as she does not want to go into the weekend with these symptoms.

## 2023-11-20 NOTE — Telephone Encounter (Signed)
 Called Patient to advise per Tona Francis to got to Urgent Care due to the office not having any available appointments until 11/25/23. Patient stated okay that is what I will do.

## 2023-11-20 NOTE — Telephone Encounter (Signed)
 Please advise pt to be seen at urgent care if no appointments available.

## 2023-11-21 ENCOUNTER — Emergency Department: Payer: MEDICAID

## 2023-11-21 ENCOUNTER — Emergency Department
Admission: EM | Admit: 2023-11-21 | Discharge: 2023-11-21 | Disposition: A | Discharge status: Home / Self Care | Payer: MEDICAID | Attending: Emergency Medicine | Admitting: Emergency Medicine

## 2023-11-21 DIAGNOSIS — R0789 Other chest pain: Secondary | ICD-10-CM | POA: Insufficient documentation

## 2023-11-21 DIAGNOSIS — F419 Anxiety disorder, unspecified: Secondary | ICD-10-CM | POA: Diagnosis present

## 2023-11-21 DIAGNOSIS — R0602 Shortness of breath: Secondary | ICD-10-CM | POA: Diagnosis not present

## 2023-11-21 LAB — CBC
HCT: 43 % (ref 36.0–46.0)
Hemoglobin: 13.7 g/dL (ref 12.0–15.0)
MCH: 29.3 pg (ref 26.0–34.0)
MCHC: 31.9 g/dL (ref 30.0–36.0)
MCV: 92.1 fL (ref 80.0–100.0)
Platelets: 221 10*3/uL (ref 150–400)
RBC: 4.67 MIL/uL (ref 3.87–5.11)
RDW: 12.9 % (ref 11.5–15.5)
WBC: 6.7 10*3/uL (ref 4.0–10.5)
nRBC: 0 % (ref 0.0–0.2)

## 2023-11-21 LAB — BASIC METABOLIC PANEL WITH GFR
Anion gap: 9 (ref 5–15)
BUN: 20 mg/dL (ref 6–20)
CO2: 24 mmol/L (ref 22–32)
Calcium: 9.2 mg/dL (ref 8.9–10.3)
Chloride: 106 mmol/L (ref 98–111)
Creatinine, Ser: 0.75 mg/dL (ref 0.44–1.00)
GFR, Estimated: >60 mL/min (ref >60)
Glucose, Bld: 129 mg/dL — ABNORMAL HIGH (ref 70–99)
Potassium: 3.8 mmol/L (ref 3.5–5.1)
Sodium: 139 mmol/L (ref 135–145)

## 2023-11-21 LAB — TROPONIN I (HIGH SENSITIVITY): Troponin I (High Sensitivity): 4 ng/L (ref <18)

## 2023-11-21 MED ORDER — CLONAZEPAM 0.5 MG PO TABS: 0.5000 mg | ORAL_TABLET | Freq: Two times a day (BID) | ORAL | 0 refills | Status: DC

## 2023-11-21 MED ORDER — CLONAZEPAM 0.5 MG PO TABS: 0.5000 mg | ORAL_TABLET | Freq: Two times a day (BID) | ORAL | 0 refills | Status: DC | PRN

## 2023-11-21 NOTE — Discharge Instructions (Addendum)
 Please follow-up with the [facility].  You can call them to make an appointment.  We have prescribed some Klonopin  to help with your anxiety to take as needed.  Be careful and do not combine it with your oxycodone  as this can cause increased sedation.  Return to the ER if you develop worsening symptoms or any other concerns

## 2023-11-21 NOTE — ED Triage Notes (Signed)
 Pt c/o anxiety/panic attack x few days. Pt states she has been "shaky, having chest tightness, and SOB x few days." Pt states the chest tightness and SOB has gotten "so bad it is waking me up in the middle of the night."  Pt takes Seroquel  and Cymbalta  and has hx of anxiety.

## 2023-11-21 NOTE — ED Provider Notes (Addendum)
 [facility] Provider Note    Event Date/Time   First MD Initiated Contact with Patient 11/21/23 2140     (approximate)   History   Anxiety, Chest Pain, and Shortness of Breath   HPI  Andrea [name] is a 43 y.o. female with history of anxiety who comes in with concerns for panic attacks.  Patient reports over the past couple of days she has had these episodes of chest tightness and shortness of breath.  She states that they come on at different times.  She reports that they sometimes wake her up in the middle of the night.  She reports that she takes Seroquel  and Cymbalta  for her history of depression but she recently got released by her psychiatrist.  She got released because she missed appointment.  She is working on trying to find a new Therapist, sports.  She states that she has been on hydroxyzine  in the past and has not been helpful.  She does report in the past that Xanax  and Klonopin  have been more helpful but she has not been on that for some years.  She denies any risk factors for PE.  She does report a remote history of a ankle injury with a surgery back in September but she states that she is up and ambulatory every day and denies any recent surgeries in the past 3 months, estrogen use.  She denies any shortness of breath at this time.  She is feels that this is all related to her anxiety. She denies any hallucinations, SI, HI  Physical Exam   Triage Vital Signs: ED Triage Vitals  Encounter Vitals Group     BP 11/21/23 2051 (!) 147/96     Systolic BP Percentile --      Diastolic BP Percentile --      Pulse Rate 11/21/23 2051 78     Resp 11/21/23 2051 18     Temp 11/21/23 2051 98.1 F (36.7 C)     Temp Source 11/21/23 2051 Oral     SpO2 11/21/23 2051 97 %     Weight 11/21/23 2052 285 lb (129.3 kg)     Height 11/21/23 2052 5\' 11"  (1.803 m)     Head Circumference --      Peak Flow --      Pain Score 11/21/23 2052 0     Pain Loc --      Pain  Education --      Exclude from Growth Chart --     Most recent vital signs: Vitals:   11/21/23 2051  BP: (!) 147/96  Pulse: 78  Resp: 18  Temp: 98.1 F (36.7 C)  SpO2: 97%     General: Awake, no distress.  CV:  Good peripheral perfusion.  Resp:  Normal effort.  Clear lungs Abd:  No distention.  Soft and nontender Other:  Patient reports episodes of anxiety.  She denies any SI HI hallucinations She is got some surgical scars noted to her right ankle but no swelling in legs.  No calf tenderness  ED Results / Procedures / Treatments   Labs (all labs ordered are listed, but only abnormal results are displayed) Labs Reviewed  BASIC METABOLIC PANEL WITH GFR - Abnormal; Notable for the following components:      Result Value   Glucose, Bld 129 (*)    All other components within normal limits  CBC  TROPONIN I (HIGH SENSITIVITY)     EKG  My interpretation of EKG:  Normal sinus rhythm 69 without any ST elevation or T wave inversions, normal intervals  RADIOLOGY I have reviewed the xray personally and interpreted no PNA    PROCEDURES:  Critical Care performed: No  Procedures   MEDICATIONS ORDERED IN ED: Medications - No data to display   IMPRESSION / MDM / ASSESSMENT AND PLAN / ED COURSE  I reviewed the triage vital signs and the nursing notes.   Patient's presentation is most consistent with acute presentation with potential threat to life or bodily function.   Troponin is negative.  She did report her last episode happened around 7:00 and I did discuss doing a repeat troponin to ensure no heart attack but she reports having similar episodes over the past few days and declines a repeat troponin as she feels that this is all related to her anxiety.  She is PERC negative unlikely PE.  I did see that patient was previously on Eliquis  I asked patient about this and she stated that this was when she had had her surgery that it was preventative to prevent blood clots  but she denies any history of blood clots.  She states that she stopped this 2 months ago.  We discussed D-dimer to evaluate for PE but patient is technically PERC negative and she denies any shortness of breath at time and states that this feels similar to when she has had anxiety attacks in the past.  She does report a new stressor of some issues with her family and she declines further workup for blood clots and repeat troponin. CBC is reassuring BMP reassuring  We discussed psychiatric evaluation but she denies needing to see them today.  She does not feel she needs psychiatric hospitalization she just feels that she needs something to help get her to when she can get an outpatient psychiatric appointment.  She states that she can [facility] at [facility].  She denies any danger to self or others.  We discussed that benzos have addictive potential and that she would be using it as needed.  We also discussed the dangers of using with her oxycodone  and she expressed understanding.  She understands in the emergency room can only give her a short supply and that she needs to be plugged in with a psychiatrist or a primary care doctor for additional benzodiazepines.  I will give her 5 days worth which will help get her into an appointment hopefully this week.  I see in the past patient in 2018 was on Klonopin  0.5 twice daily as needed.     FINAL CLINICAL IMPRESSION(S) / ED DIAGNOSES   Final diagnoses:  Anxiety     Rx / DC Orders   ED Discharge Orders          Ordered    clonazePAM  (KLONOPIN ) 0.5 MG tablet  2 times daily        11/21/23 2209             Note:  This document was prepared using Dragon voice recognition software and may include unintentional dictation errors.   Lubertha Rush, MD 11/21/23 2212    Lubertha Rush, MD 11/21/23 2224

## 2023-11-24 ENCOUNTER — Encounter: Payer: Self-pay | Admitting: Podiatry

## 2023-11-27 ENCOUNTER — Telehealth: Payer: Self-pay | Admitting: Podiatry

## 2023-11-27 NOTE — Telephone Encounter (Signed)
 Patient called in stating that she usually has Oxycodone  5/325mg  sent in weekly, but has not had a rx sent in this week. Could you send one in for her? Please and thanks!

## 2023-12-01 ENCOUNTER — Encounter: Payer: Self-pay | Admitting: Podiatry

## 2023-12-01 ENCOUNTER — Other Ambulatory Visit: Payer: Self-pay | Admitting: Podiatry

## 2023-12-01 ENCOUNTER — Telehealth: Payer: Self-pay

## 2023-12-01 MED ORDER — OXYCODONE-ACETAMINOPHEN 5-325 MG PO TABS: 1.0000 | ORAL_TABLET | ORAL | 0 refills | Status: AC | PRN

## 2023-12-01 NOTE — Progress Notes (Signed)
Refill of percocet sent.

## 2023-12-01 NOTE — Telephone Encounter (Signed)
 Spoke to pt again.  She stated that she has not been seen at [facility] or [facility].  I gave her telephone numbers for each practice and let her know that referral information was sent to each practice on 11/27/23.  Pt stated then stated that the bone stimulator was prescribed by Dr. Margean Sheehan from [facility] and that he told her to see Dr. Rosemarie Conquest or Dr. Luster Salters for follow-up.  I informed pt that we have submitted referral information to be seen by Dr. Naveira for pain management on 11/27/23

## 2023-12-01 NOTE — Telephone Encounter (Signed)
 Spoke to pt this afternoon. Informed her that Dr. Rosemarie Conquest did refill medication today for the last time.  Will need to be seen at Pain Management for future refills.  Pt states they have been seen at both [facility] and [facility].  Was given a Bone Stimulator.  Patient does not have future appointment scheduled  with either practice.  Referral paperwork was once again submitted to Pain Management for office to contact patient to be treated.

## 2023-12-01 NOTE — Telephone Encounter (Signed)
 Called and spoke with [facility] pain management. Patient's referral is being reviewed by Dr. Barth Borne. No addition information is needed at this time. If they do require additional information, they will send a request to our office. Thanks

## 2023-12-03 ENCOUNTER — Other Ambulatory Visit: Payer: Self-pay | Admitting: Podiatry

## 2023-12-05 ENCOUNTER — Encounter: Payer: Self-pay | Admitting: Nurse Practitioner

## 2023-12-05 ENCOUNTER — Ambulatory Visit (INDEPENDENT_AMBULATORY_CARE_PROVIDER_SITE_OTHER): Payer: MEDICAID | Admitting: Nurse Practitioner

## 2023-12-05 VITALS — BP 138/84 | HR 70 | Temp 98.1°F | Resp 20 | Ht 71.0 in | Wt 291.0 lb

## 2023-12-05 DIAGNOSIS — F250 Schizoaffective disorder, bipolar type: Secondary | ICD-10-CM | POA: Diagnosis not present

## 2023-12-05 DIAGNOSIS — F410 Panic disorder [episodic paroxysmal anxiety] without agoraphobia: Secondary | ICD-10-CM | POA: Diagnosis not present

## 2023-12-05 DIAGNOSIS — F411 Generalized anxiety disorder: Secondary | ICD-10-CM | POA: Diagnosis not present

## 2023-12-05 MED ORDER — CLONAZEPAM 0.5 MG PO TABS: 0.5000 mg | ORAL_TABLET | Freq: Three times a day (TID) | ORAL | 0 refills | Status: DC | PRN

## 2023-12-05 MED ORDER — BUSPIRONE HCL 7.5 MG PO TABS: 7.5000 mg | ORAL_TABLET | Freq: Two times a day (BID) | ORAL | 0 refills | Status: DC
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
## 2023-12-08 ENCOUNTER — Encounter: Payer: Self-pay | Admitting: Podiatry

## 2023-12-09 ENCOUNTER — Other Ambulatory Visit: Payer: Self-pay | Admitting: Podiatry

## 2023-12-09 MED ORDER — OXYCODONE-ACETAMINOPHEN 5-325 MG PO TABS: 1.0000 | ORAL_TABLET | ORAL | 0 refills | Status: AC | PRN

## 2023-12-09 MED ORDER — GABAPENTIN 600 MG PO TABS: 600.0000 mg | ORAL_TABLET | Freq: Three times a day (TID) | ORAL | 3 refills | Status: DC

## 2023-12-09 NOTE — Progress Notes (Signed)
 Refill of gabapentin  and percocet sent

## 2023-12-12 ENCOUNTER — Ambulatory Visit
Admission: RE | Admit: 2023-12-12 | Discharge: 2023-12-12 | Disposition: A | Discharge status: Home / Self Care | Payer: MEDICAID | Source: Ambulatory Visit | Attending: Emergency Medicine | Admitting: Emergency Medicine

## 2023-12-12 ENCOUNTER — Encounter: Payer: Self-pay | Admitting: Nurse Practitioner

## 2023-12-12 VITALS — BP 135/91 | HR 79 | Temp 98.1°F | Resp 14 | Ht 71.0 in | Wt 291.0 lb

## 2023-12-12 DIAGNOSIS — S91301A Unspecified open wound, right foot, initial encounter: Secondary | ICD-10-CM | POA: Diagnosis not present

## 2023-12-12 MED ORDER — DOXYCYCLINE HYCLATE 100 MG PO CAPS: 100.0000 mg | ORAL_CAPSULE | Freq: Two times a day (BID) | ORAL | 0 refills | Status: DC

## 2023-12-12 NOTE — Assessment & Plan Note (Addendum)
 Severe anxiety and panic attacks are exacerbated by stressors. Klonopin  provides short-term relief, while hydroxyzine  is ineffective. Buspirone  is considered for long-term management. Limitations of benzodiazepines were discussed. Prescribe Klonopin  0.5 mg three times daily until the psychiatry appointment. PDMP reviewed. Initiate Buspar  7.5 mg twice daily for long-term management. Counseled on common side effects. Continue Cymbalta  and Seroquel . Attend the psychiatry appointment next week. Seek immediate care if symptoms worsen or safety concerns arise.

## 2023-12-12 NOTE — Discharge Instructions (Addendum)
 Keep the wound on your right heel clean and leave the dressing in place.  Remove the dressing at least once daily, wash the area with warm water and soap, and apply bacitracin ointment to the open area.  Then cover the wound with a nonstick pad and Coban.  Keep your right foot elevated is much as possible to keep weight off of the wound and help decrease swelling to aid in pain relief.  Take the doxycycline  twice daily with food for 10 days to cover for and prevent bacterial infection given that the wound is on your foot.  I have referred you to the wound care center for evaluation and management of your wound.  If you develop any swelling, redness, red streaks going up your leg, pus drainage from the wound, or you start running fevers you either need to be reevaluated by your PCP, in urgent care, or in the emergency department.

## 2023-12-12 NOTE — ED Provider Notes (Signed)
 [facility]    CSN: [id number] Arrival date & time: 12/12/23  1105      History   Chief Complaint Chief Complaint  Patient presents with   Foot Injury    Appointment    HPI Andrea [name] is a 43 y.o. female.   HPI  43 year old female with past medical history significant for uncontrolled diabetes mellitus, seizures, bipolar 1 disorder, neuromuscular disorder, migraine headaches, PCOS, drug-induced akathisia, asthma, anemia, GERD, schizophrenia, hypertension, and arthritis presents for evaluation of a wound on her right heel.  She reports that she noticed the wound yesterday and reports that there is some bloody drainage from the wound.  No fever.  Patient does not remember any injury though has significant peripheral neuropathy.  Past Medical History:  Diagnosis Date   Allergy    Anemia    Anxiety    Arthritis    Asthma    Bipolar 1 disorder (HCC)    Bipolar 1 disorder, mixed, severe (HCC) 12/20/2016   Bipolar I disorder, most recent episode depressed (HCC) 12/20/2016   Complication of anesthesia    hard time waking up    Depression    Diabetes mellitus without complication (HCC)    Drug induced akathisia 04/02/2022   GERD (gastroesophageal reflux disease)    no meds   Hypertension    Kidney stones    Long term current use of antipsychotic medication 03/13/2022   Low back pain    Migraines    Neuromuscular disorder (HCC)    PCOS (polycystic ovarian syndrome)    PONV (postoperative nausea and vomiting)    Schizophrenia (HCC)    Seizure (HCC) 10/03/2021   Seizures (HCC) 06/04/2016   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[facility] 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   Shortness of breath dyspnea    with bronchitis    Patient Active Problem List   Diagnosis Date Noted   Schizoaffective disorder, bipolar type (HCC) 12/05/2023   Acute right-sided low back pain without sciatica 07/18/2023   Acute right flank pain 07/18/2023   Fluctuating blood pressure 07/18/2023    Hypotension 07/02/2023   Occasional tremors 07/02/2023   Constipation 07/02/2023   Diabetes mellitus treated with oral medication (HCC) 07/02/2023   Syncope due to orthostatic hypotension 06/20/2023   Obesity (BMI 30-39.9) 05/14/2023   Acquired varus deformity of right ankle 05/13/2023   Postoperative surgical complication involving musculoskeletal system associated with musculoskeletal procedure 05/12/2023   Closed right ankle fracture 05/03/2023   Bipolar 1 disorder (HCC) 05/03/2023   Type 2 diabetes mellitus with peripheral neuropathy (HCC) 05/03/2023   Essential hypertension 05/03/2023   Mild persistent asthma 05/03/2023   Pre-syncope 05/03/2023   Chronic pain 07/16/2022   PTSD (post-traumatic stress disorder) 03/13/2022   Generalized anxiety disorder with panic attacks 03/13/2022   Major depressive disorder, 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 03/13/2022   Other insomnia 03/13/2022   Long term current use of antipsychotic medication 03/13/2022   Polypharmacy 03/13/2022   Convulsion (HCC) 11/26/2021   Irregular periods 09/26/2020   Uncontrolled type 2 diabetes mellitus with hyperglycemia, with long-term current use of insulin  (HCC) 09/18/2016   HTN (hypertension) 09/18/2016   Pelvic pain in female 11/14/2015   Acute pyelonephritis 01/16/2013   Migraines 10/30/2012   Obesity, Class III, BMI 40-49.9 (morbid obesity) (HCC) 10/30/2012   Kidney stones     Past Surgical History:  Procedure Laterality Date   ABDOMINAL HYSTERECTOMY N/A 11/14/2015   Procedure: HYSTERECTOMY  ABDOMINAL;  Surgeon: Oneil FORBES Piety, MD;  Location: [facility];  Service: Gynecology;  Laterality: N/A;   ANKLE FUSION Right 05/13/2023   Procedure: ARTHRODESIS ANKLE;  Surgeon: Malvin Marsa FALCON, DPM;  Location: [facility];  Service: Orthopedics/Podiatry;  Laterality: Right;  Hardware removal, right ankle tibiot talo  calcaneal arthrodesis   CHOLECYSTECTOMY     FRACTURE SURGERY     INTRAUTERINE DEVICE (IUD) INSERTION  06/14/2014   [facility]   LYMPH NODES REMOVED     ORIF ANKLE FRACTURE Right 05/03/2023   Procedure: OPEN REDUCTION INTERNAL FIXATION (ORIF) ANKLE FRACTURE;  Surgeon: Silva Juliene SAUNDERS, DPM;  Location: [facility];  Service: Orthopedics/Podiatry;  Laterality: Right;   ovarian cyst removed     SALPINGOOPHORECTOMY Bilateral 11/14/2015   Procedure: SALPINGO OOPHORECTOMY;  Surgeon: Oneil FORBES Piety, MD;  Location: [facility];  Service: Gynecology;  Laterality: Bilateral;    OB History   No obstetric history on file.      Home Medications    Prior to Admission medications   Medication Sig Start Date End Date Taking? Authorizing Provider  doxycycline  (VIBRAMYCIN ) 100 MG capsule Take 1 capsule (100 mg total) by mouth 2 (two) times daily. 12/12/23  Yes Bernardino Ditch, NP  oxyCODONE -acetaminophen  (PERCOCET/ROXICET) 5-325 MG tablet Take 1 tablet by mouth every 4 (four) hours as needed for up to 5 days for severe pain (pain score 7-10). 12/09/23 12/14/23  Standiford, Marsa FALCON, DPM  Albuterol -Budesonide  (AIRSUPRA) 90-80 MCG/ACT AERO  04/01/23   [provider]  Atogepant  (QULIPTA ) 30 MG TABS Take 1 tablet daily 09/24/23   Georjean Darice HERO, MD  busPIRone  (BUSPAR ) 7.5 MG tablet Take 1 tablet (7.5 mg total) by mouth 2 (two) times daily. 12/05/23   Gretel App, NP  cetirizine  (ZYRTEC ) 10 MG tablet Take 10 mg by mouth at bedtime. 11/05/23   [provider]  clonazePAM  (KLONOPIN ) 0.5 MG tablet Take 1 tablet (0.5 mg total) by mouth 3 (three) times daily as needed for up to 7 days for anxiety. 12/05/23 12/12/23  Gretel App, NP  DULoxetine  (CYMBALTA ) 60 MG capsule Take 2 capsules (120 mg total) by mouth daily. 10/13/23   Barbra Jayson LABOR, MD  eletriptan  (RELPAX ) 40 MG tablet Take 1 tablet at onset of migraine. May repeat in 2 hours if headache persists or recurs. Do not take more than 3 a week  07/23/23   Georjean Darice HERO, MD  famotidine  (PEPCID ) 40 MG tablet Take 1 tablet (40 mg total) by mouth 2 (two) times daily. 07/28/23   Kaur, Charanpreet, NP  gabapentin  (NEURONTIN ) 600 MG tablet Take 1 tablet (600 mg total) by mouth 3 (three) times daily. 12/09/23   Standiford, Marsa FALCON, DPM  Galcanezumab -gnlm (EMGALITY ) 120 MG/ML SOAJ Inject 1 Pen into the skin every 30 (thirty) days. 01/13/23   Georjean Darice HERO, MD  GEMTESA 75 MG TABS Take 1 tablet by mouth daily. 01/02/23   [provider]  JARDIANCE  25 MG TABS tablet Take 1 tablet (25 mg total) by mouth every morning. 10/13/23   Kaur, Charanpreet, NP  JOURNAVX 50 MG TABS Take 1 tablet by mouth 2 (two) times daily.    [provider]  montelukast  (SINGULAIR ) 10 MG tablet TAKE 1 TABLET BY MOUTH EVERYDAY AT BEDTIME 09/30/23   Kaur, Charanpreet, NP  ondansetron  (ZOFRAN -ODT) 4 MG disintegrating tablet Take 2 tablets (8 mg total) by mouth every 8 (eight) hours as needed for nausea or vomiting. 09/06/23   Corlis Burnard DEL, NP  pantoprazole  (PROTONIX )  40 MG tablet Take 1 tablet (40 mg total) by mouth every morning. 07/02/23   Vincente Saber, NP  potassium chloride  (MICRO-K ) 10 MEQ CR capsule Take 10 mEq by mouth daily. 10/09/22   [provider]  QUEtiapine  (SEROQUEL ) 50 MG tablet Take 1 tablet in the morning, afternoon and 2 at night. 10/13/23   Barbra Jayson LABOR, MD  Semaglutide ,0.25 or 0.5MG /DOS, 2 MG/3ML SOPN Inject 0.25 mg into the skin once a week. 10/09/23   Kaur, Charanpreet, NP  Semaglutide ,0.25 or 0.5MG /DOS, 2 MG/3ML SOPN Inject 0.25 mg into the skin once a week. 10/09/23   Kaur, Charanpreet, NP  SYMBICORT 160-4.5 MCG/ACT inhaler Inhale 2 puffs into the lungs. 08/13/22   [provider]  tiZANidine  (ZANAFLEX ) 4 MG tablet Take 4 mg by mouth every 8 (eight) hours as needed. 07/28/23   [provider]  traZODone  (DESYREL ) 150 MG tablet Take 1 tablet (150 mg total) by mouth at bedtime. 09/04/23   Vincente Saber, NP   zonisamide  (ZONEGRAN ) 100 MG capsule Take 5 capsules every night 12/06/22   Georjean Darice HERO, MD    Family History Family History  Problem Relation Age of Onset   Miscarriages / Stillbirths Mother    Hypertension Mother    Heart disease Mother    Hypertension Father    Healthy Sister    Healthy Brother    Depression Maternal Aunt    Depression Maternal Aunt    Diabetes Maternal Aunt     Social History Social History   Tobacco Use   Smoking status: Never   Smokeless tobacco: Never  Vaping Use   Vaping status: Never Used  Substance Use Topics   Alcohol  use: Never   Drug use: Never     Allergies   Bactrim [sulfamethoxazole -trimethoprim] and Codeine   Review of Systems Review of Systems  Constitutional:  Negative for fever.  Skin:  Positive for wound. Negative for color change.     Physical Exam Triage Vital Signs ED Triage Vitals  Encounter Vitals Group     BP      Girls Systolic BP Percentile      Girls Diastolic BP Percentile      Boys Systolic BP Percentile      Boys Diastolic BP Percentile      Pulse      Resp      Temp      Temp src      SpO2      Weight      Height      Head Circumference      Peak Flow      Pain Score      Pain Loc      Pain Education      Exclude from Growth Chart    No data found.  Updated Vital Signs BP (!) 135/91 (BP Location: Right Arm)   Pulse 79   Temp 98.1 F (36.7 C) (Oral)   Resp 14   Ht 5' 11 (1.803 m)   Wt 291 lb 0.1 oz (132 kg)   LMP  (LMP Unknown)   SpO2 95%   BMI 40.59 kg/m   Visual Acuity Right Eye Distance:   Left Eye Distance:   Bilateral Distance:    Right Eye Near:   Left Eye Near:    Bilateral Near:     Physical Exam Vitals and nursing note reviewed.  Constitutional:      Appearance: Normal appearance. She is not ill-appearing.  HENT:  Head: Normocephalic and atraumatic.   Skin:    General: Skin is warm and dry.     Capillary Refill: Capillary refill takes less than 2  seconds.     Findings: No erythema.   Neurological:     General: No focal deficit present.     Mental Status: She is alert and oriented to person, place, and time.      UC Treatments / Results  Labs (all labs ordered are listed, but only abnormal results are displayed) Labs Reviewed - No data to display  EKG   Radiology No results found.  Procedures Procedures (including critical care time)  Medications Ordered in UC Medications - No data to display  Initial Impression / Assessment and Plan / UC Course  I have reviewed the triage vital signs and the nursing notes.  Pertinent labs & imaging results that were available during my care of the patient were reviewed by me and considered in my medical decision making (see chart for details).   Patient is a nontoxic-appearing 71 old female presenting for evaluation of a wound on her right heel as outlined in HPI above.  As you can see the image above, there appears to be a chunk of skin torn away from the sole of the patient's foot with exposure into the deep dermis.  There is also some cracking.  No appreciable erythema or drainage noted.  Patient does have a history of MRSA infection so I will discharge her home on doxycycline  100 mg twice daily for 10 days.  I will also refer her to the wound center at [facility] for ongoing monitoring and treatment of her heel wound given that she is diabetic and has significant peripheral neuropathy.  I will have staff apply a nonadherent dressing and bacitracin ointment prior to discharge.  I have advised patient she needs to change her dressing daily, and if it becomes soiled.  If she develops any swelling, redness, red streaks up her leg, or fever she is to either return for reevaluation or seek care in the ER.   Final Clinical Impressions(s) / UC Diagnoses   Final diagnoses:  Open wound of heel, right, initial encounter     Discharge Instructions      Keep the wound on your right heel clean  and leave the dressing in place.  Remove the dressing at least once daily, wash the area with warm water and soap, and apply bacitracin ointment to the open area.  Then cover the wound with a nonstick pad and Coban.  Keep your right foot elevated is much as possible to keep weight off of the wound and help decrease swelling to aid in pain relief.  Take the doxycycline  twice daily with food for 10 days to cover for and prevent bacterial infection given that the wound is on your foot.  I have referred you to the wound care center for evaluation and management of your wound.  If you develop any swelling, redness, red streaks going up your leg, pus drainage from the wound, or you start running fevers you either need to be reevaluated by your PCP, in urgent care, or in the emergency department.     ED Prescriptions     Medication Sig Dispense Auth. Provider   doxycycline  (VIBRAMYCIN ) 100 MG capsule Take 1 capsule (100 mg total) by mouth 2 (two) times daily. 20 capsule Bernardino Ditch, NP      PDMP not reviewed this encounter.   Bernardino Ditch, NP 12/12/23 1133

## 2023-12-12 NOTE — ED Triage Notes (Signed)
 Patient reports wound on the heel of her right foot since yesterday.  Patient states that she does not have much feeling in her right foot.  Patient has had 2 surgeries on her right foot since November. Patient denies any fevers.

## 2023-12-12 NOTE — Assessment & Plan Note (Addendum)
 Experiencing hallucinations impacting daily life requires psychiatric evaluation for medication adjustment. She is not currently a danger but needs close monitoring. Emphasize the importance of attending the psychiatry appointment for medication adjustment. Seek immediate care if hallucinations worsen or safety concerns arise. Continue Cymbalta  and Seroquel .

## 2023-12-16 NOTE — Telephone Encounter (Signed)
 Please call pt to check how is her BP and BS numbers and medication is she taking?

## 2023-12-16 NOTE — Telephone Encounter (Signed)
 Okay with me for TOC with Kacy.

## 2023-12-17 NOTE — Telephone Encounter (Signed)
 BP 151/90 and no BS readings due to the Dexcom keeps falling off. Patient states her medication list is up to date and she is not having any side effects of the medications. Patient went to [facility] on Friday.

## 2023-12-18 ENCOUNTER — Telehealth: Payer: MEDICAID | Admitting: Physician Assistant

## 2023-12-18 ENCOUNTER — Other Ambulatory Visit: Payer: Self-pay | Admitting: Nurse Practitioner

## 2023-12-18 ENCOUNTER — Encounter: Payer: Self-pay | Admitting: Nurse Practitioner

## 2023-12-18 DIAGNOSIS — J028 Acute pharyngitis due to other specified organisms: Secondary | ICD-10-CM | POA: Diagnosis not present

## 2023-12-18 DIAGNOSIS — F410 Panic disorder [episodic paroxysmal anxiety] without agoraphobia: Secondary | ICD-10-CM

## 2023-12-18 DIAGNOSIS — B9689 Other specified bacterial agents as the cause of diseases classified elsewhere: Secondary | ICD-10-CM | POA: Diagnosis not present

## 2023-12-18 MED ORDER — PREDNISONE 20 MG PO TABS: 40.0000 mg | ORAL_TABLET | Freq: Every day | ORAL | 0 refills | Status: DC

## 2023-12-18 MED ORDER — CLONAZEPAM 0.5 MG PO TABS: 0.5000 mg | ORAL_TABLET | Freq: Three times a day (TID) | ORAL | 0 refills | Status: DC | PRN

## 2023-12-18 MED ORDER — AMOXICILLIN 500 MG PO CAPS: 500.0000 mg | ORAL_CAPSULE | Freq: Two times a day (BID) | ORAL | 0 refills | Status: DC

## 2023-12-18 NOTE — Progress Notes (Signed)
Controlled substance database reviewed.  Medication sent to pharmacy.

## 2023-12-18 NOTE — Telephone Encounter (Signed)
 Pt requesting refill. Stated she is completely out. Please see mychart message..   Requesting: Klonopin  Contract: No UDS: 05/27/23(hospital) Last Visit: 12/05/2023 Next Visit: 03/26/2024 Last Refill: E-Prescribing Status: Receipt confirmed by pharmacy (12/05/2023 11:10 AM EDT)   Please Advise

## 2023-12-18 NOTE — Progress Notes (Signed)
 E-Visit for Sore Throat - Strep Symptoms  We are sorry that you are not feeling well.  Here is how we plan to help!  Based on what you have shared with me it is likely that you have strep pharyngitis.  Strep pharyngitis is inflammation and infection in the back of the throat.  This is an infection cause by bacteria and is treated with antibiotics.  I have prescribed Amoxicillin  500 mg twice a day for 10 days and Prednisone  20mg  Take 2 tablets (40mg ) daily for 5 days. For throat pain, we recommend over the counter oral pain relief medications such as acetaminophen  or aspirin , or anti-inflammatory medications such as ibuprofen  or naproxen  sodium. Topical treatments such as oral throat lozenges or sprays may be used as needed. Strep infections are not as easily transmitted as other respiratory infections, however we still recommend that you avoid close contact with loved ones, especially the very young and elderly.  Remember to wash your hands thoroughly throughout the day as this is the number one way to prevent the spread of infection and wipe down door knobs and counters with disinfectant.   Home Care: Only take medications as instructed by your medical team. Complete the entire course of an antibiotic. Do not take these medications with alcohol . A steam or ultrasonic humidifier can help congestion.  You can place a towel over your head and breathe in the steam from hot water coming from a faucet. Avoid close contacts especially the very young and the elderly. Cover your mouth when you cough or sneeze. Always remember to wash your hands.  Get Help Right Away If: You develop worsening fever or sinus pain. You develop a severe head ache or visual changes. Your symptoms persist after you have completed your treatment plan.  Make sure you Understand these instructions. Will watch your condition. Will get help right away if you are not doing well or get worse.   Thank you for choosing an  e-visit.  Your e-visit answers were reviewed by a board certified advanced clinical practitioner to complete your personal care plan. Depending upon the condition, your plan could have included both over the counter or prescription medications.  Please review your pharmacy choice. Make sure the pharmacy is open so you can pick up prescription now. If there is a problem, you may contact your provider through [employer] and have the prescription routed to another pharmacy.  Your safety is important to us . If you have drug allergies check your prescription carefully.   For the next 24 hours you can use MyChart to ask questions about today's visit, request a non-urgent call back, or ask for a work or school excuse. You will get an email in the next two days asking about your experience. I hope that your e-visit has been valuable and will speed your recovery.   I have spent 5 minutes in review of e-visit questionnaire, review and updating patient chart, medical decision making and response to patient.   Delon CHRISTELLA Dickinson, PA-C

## 2023-12-18 NOTE — Telephone Encounter (Signed)
 Please inform pt: I have send Klonopin  for 5 days to cover until Leron is back. She needs to schedule appointment with [facility] for refill. Notes states Klonopin  was prescribed to cover till Psychiatrist appointment.

## 2023-12-22 ENCOUNTER — Encounter: Payer: Self-pay | Admitting: Nurse Practitioner

## 2023-12-22 ENCOUNTER — Other Ambulatory Visit (HOSPITAL_COMMUNITY): Payer: Self-pay

## 2023-12-22 ENCOUNTER — Encounter: Payer: Self-pay | Admitting: Podiatry

## 2023-12-23 ENCOUNTER — Other Ambulatory Visit: Payer: Self-pay | Admitting: Podiatry

## 2023-12-23 MED ORDER — OXYCODONE-ACETAMINOPHEN 10-325 MG PO TABS: 1.0000 | ORAL_TABLET | ORAL | 0 refills | Status: DC | PRN

## 2023-12-24 ENCOUNTER — Telehealth: Payer: Self-pay

## 2023-12-24 ENCOUNTER — Other Ambulatory Visit: Payer: Self-pay | Admitting: Neurology

## 2023-12-24 NOTE — Telephone Encounter (Signed)
 Pharmacy Patient Advocate Encounter   Received notification from CoverMyMeds that prior authorization for Emgality  120MG /ML auto-injectors (migraine) is required/requested.   Insurance verification completed.   The patient is insured through [employer] .   Per test claim: Patient has not been seen in a year. Must have documented office update for authorization submission.

## 2023-12-25 ENCOUNTER — Telehealth: Payer: Self-pay | Admitting: Podiatry

## 2023-12-25 NOTE — Telephone Encounter (Signed)
 [facility] needs a diagnostic code for: oxyCODONE -acetaminophen  (PERCOCET) 10-325 MG tablet   Please contact Bernard @ [phone]). Thank you

## 2023-12-26 ENCOUNTER — Ambulatory Visit (INDEPENDENT_AMBULATORY_CARE_PROVIDER_SITE_OTHER): Payer: MEDICAID

## 2023-12-26 VITALS — BP 120/70 | HR 77 | Temp 98.2°F | Ht 71.0 in | Wt 290.6 lb

## 2023-12-26 DIAGNOSIS — F250 Schizoaffective disorder, bipolar type: Secondary | ICD-10-CM

## 2023-12-26 DIAGNOSIS — F410 Panic disorder [episodic paroxysmal anxiety] without agoraphobia: Secondary | ICD-10-CM | POA: Diagnosis not present

## 2023-12-26 DIAGNOSIS — R030 Elevated blood-pressure reading, without diagnosis of hypertension: Secondary | ICD-10-CM | POA: Diagnosis not present

## 2023-12-26 DIAGNOSIS — F411 Generalized anxiety disorder: Secondary | ICD-10-CM

## 2023-12-26 MED ORDER — CLONAZEPAM 0.5 MG PO TABS: 0.5000 mg | ORAL_TABLET | Freq: Two times a day (BID) | ORAL | 0 refills | Status: DC | PRN

## 2023-12-26 NOTE — Progress Notes (Signed)
 Acute Office Visit  Subjective:    Patient ID: Andrea [name], female    DOB: 03-14-1981, 43 y.o.   MRN: [id number]  Chief Complaint  Patient presents with   Anxiety   Panic Attack   Patient is in today for acute visit for elevated blood pressure, panic attack.    HPI  Discussed the use of AI scribe software for clinical note transcription with the patient, who gave verbal consent to proceed.  History of Present Illness Andrea [name] is a 43 year old female with h/o schizoaffective, bipolar, PTSD presenting for evaluation of  anxiety and elevated blood pressure readings.   She has been experiencing elevated blood pressure, with home readings reaching as high as 160/100 mmHg. She has a history of diabetes, which is relevant to her blood pressure management.   She experiences significant anxiety and panic attacks, particularly at night, accompanied by auditory hallucinations. These symptoms have persisted for over a month, with panic attacks worsening at night, causing fear and nightmares. She is established with psychiatrist and has appointment with psychiatrist in 01/05/24. Patient's Seroquel , recently increased from 25 mg to 50 mg by her psychiatrist. She is also taking Buspar  7.5 mg BID and clonazepam  0.5 mg twice a day, primarily at night to manage panic attacks. She is requesting refill for Clonazepam  and reports her psychiatrist told her they do not prescribe clonazepam .   She has a family history of schizophrenia, as her aunt has been diagnosed with the condition. She has been seeing a psychiatrist, but her previous psychiatrist retired, and she is scheduled to see a new psychiatrist at the end of July.  Patient is waiting for [facility] with new PCP Corinda Glance) who saw the patient on 12/05/23 for acute visit. She really liked care provided by [facility] and looking forward to her visit.   Her diet includes a tomato sandwich for lunch and grilled items with vegetables for dinner. She  drinks diet soda and consumes four to five bottles of water daily. Her physical activity has been limited due to a broken ankle, but she is attempting to walk more. She does not engage in strength training exercises.    ROS As per HPI    Objective:    BP 120/70 (BP Location: Right Arm, Cuff Size: Large)   Pulse 77   Temp 98.2 F (36.8 C) (Oral)   Ht 5' 11 (1.803 m)   Wt 290 lb 9.6 oz (131.8 kg)   LMP  (LMP Unknown)   SpO2 94%   BMI 40.53 kg/m    Physical Exam Constitutional:      Appearance: Normal appearance.  HENT:     Head: Normocephalic and atraumatic.     Mouth/Throat:     Mouth: Mucous membranes are moist.  Neck:     Thyroid: No thyroid mass or thyroid tenderness.  Cardiovascular:     Rate and Rhythm: Normal rate and regular rhythm.  Pulmonary:     Effort: Pulmonary effort is normal.     Breath sounds: Normal breath sounds. No wheezing or rales.  Abdominal:     General: Bowel sounds are normal.     Palpations: Abdomen is soft.  Musculoskeletal:     Cervical back: Neck supple. No rigidity.     Right lower leg: No edema.     Left lower leg: No edema.  Skin:    General: Skin is warm.  Neurological:     Mental Status: She is alert and oriented to  person, place, and time.  Psychiatric:        Mood and Affect: Mood is depressed. Mood is not anxious.        Behavior: Behavior is not agitated. Behavior is cooperative.     No results found for any visits on 12/26/23.     Assessment & Plan:  Patient is a pleasant 43 year old female presenting for evaluation of elevated blood pressure reading and requesting refill on Clonazepam .   Elevated blood pressure reading Assessment & Plan: BP on arrival was elevated, repeat BP within goal 120/70 mmHg.  No new antihypertensive due to normal reading post-rest.  Emphasized lifestyle modifications, information on DASH diet was done today.  Check blood pressure at home three times a week, rest five minutes before  measurement, record readings. Aim for target blood pressure <130/80 mmHg. If home readings >200/100 mmHg, consider emergency department evaluation. Follow up with primary care provider in 6-8 weeks, bring home BP reading.  Will benefit from ARB/Acei if continues to have BP >130/80 mmHg given h/o DM.     Generalized anxiety disorder with panic attacks Assessment & Plan: Plan per schizoaffective disorder, bipolar type from today's visit.   Orders: -     clonazePAM ; Take 1 tablet (0.5 mg total) by mouth 2 (two) times daily as needed for anxiety.  Dispense: 60 tablet; Refill: 0  Schizoaffective disorder, bipolar type (HCC) Assessment & Plan: Severe anxiety and panic attacks with auditory hallucinations and nightmares. PHQ9, GAD7 reviewed. No SI/HI.  Under psychiatric care and recommend close follow up with psychiatrist. Needs refill on Clonazepam  0.5 mg (BID).  Counseled on medication s/e including dependency, withdrawal risks, dementia, r/o fall. Continue current psychiatric medications until next appointment. Discussed slow tapering of clonazepam  with psychiatrist  and discussing this with them during her visit on 01/05/24. PDMP reviewed and refill on clonazepam  0.5 mg, 60 tablets, to last until next appointment was done. If SI/HI she is counseled to seek immediate medical care. This was discussed with both patient and her husband who is present during today's appointment.   Orders: -     clonazePAM ; Take 1 tablet (0.5 mg total) by mouth 2 (two) times daily as needed for anxiety.  Dispense: 60 tablet; Refill: 0  I spent 35 minutes on the day of this face-to-face encounter reviewing the patient's medical history, medications, ongoing concerns, and reviewing the assessment and plan with the patient. This time also included counseling the patient on their health conditions and management options.   Return for 6-8 weeks with [place] for BP follow up, has appointment in October with her .  Luke Shade, MD

## 2023-12-26 NOTE — Patient Instructions (Addendum)
-   Please check blood pressure at home, 3 times  a week and keep a log of blood pressure reading at home. Your goal blood pressure is really less than 130/80 mmHg. If your BP at home gets over 200/100 mmHg, you should probably go to the ED.   - If you were to have thoughts of haring yourself or others please seek immediate medical care.   - I am sending you refill on Clonazepal 0.5 mg (60 tablets)  please take this twice as needed and do not exceed more than this. Abrupt stopping of this medication can be dangerous so I recommend close follow up with your psychiatrist and PCP for tapering off this medication as appropriate.

## 2023-12-26 NOTE — Assessment & Plan Note (Signed)
 Plan per schizoaffective disorder, bipolar type from today's visit.

## 2023-12-26 NOTE — Assessment & Plan Note (Signed)
 BP on arrival was elevated, repeat BP within goal 120/70 mmHg.  No new antihypertensive due to normal reading post-rest.  Emphasized lifestyle modifications, information on DASH diet was done today.  Check blood pressure at home three times a week, rest five minutes before measurement, record readings. Aim for target blood pressure <130/80 mmHg. If home readings >200/100 mmHg, consider emergency department evaluation. Follow up with primary care provider in 6-8 weeks, bring home BP reading.  Will benefit from ARB/Acei if continues to have BP >130/80 mmHg given h/o DM.

## 2023-12-26 NOTE — Assessment & Plan Note (Signed)
 Severe anxiety and panic attacks with auditory hallucinations and nightmares. PHQ9, GAD7 reviewed. No SI/HI.  Under psychiatric care and recommend close follow up with psychiatrist. Needs refill on Clonazepam  0.5 mg (BID).  Counseled on medication s/e including dependency, withdrawal risks, dementia, r/o fall. Continue current psychiatric medications until next appointment. Discussed slow tapering of clonazepam  with psychiatrist  and discussing this with them during her visit on 01/05/24. PDMP reviewed and refill on clonazepam  0.5 mg, 60 tablets, to last until next appointment was done. If SI/HI she is counseled to seek immediate medical care. This was discussed with both patient and her husband who is present during today's appointment.

## 2023-12-28 ENCOUNTER — Other Ambulatory Visit: Payer: Self-pay | Admitting: Nurse Practitioner

## 2023-12-29 ENCOUNTER — Telehealth: Payer: Self-pay | Admitting: Pharmacy Technician

## 2023-12-29 NOTE — Telephone Encounter (Signed)
 Pharmacy Patient Advocate Encounter   Received notification from Pt Calls Messages that prior authorization for EMGALITY  120MG  is required/requested.   Insurance verification completed.   The patient is insured through [employer] .   Per test claim: PA required; PA submitted to above mentioned insurance via CoverMyMeds Key/confirmation #/EOC [id number] Status is pending

## 2023-12-29 NOTE — Telephone Encounter (Signed)
 PA has been submitted, and telephone encounter has been created. Please see telephone encounter dated 7.14.25.

## 2023-12-30 ENCOUNTER — Other Ambulatory Visit (HOSPITAL_COMMUNITY): Payer: Self-pay

## 2023-12-30 ENCOUNTER — Other Ambulatory Visit: Payer: Self-pay | Admitting: Nurse Practitioner

## 2023-12-30 ENCOUNTER — Telehealth: Payer: Self-pay | Admitting: Pharmacy Technician

## 2023-12-30 ENCOUNTER — Encounter: Payer: Self-pay | Admitting: Podiatry

## 2023-12-30 DIAGNOSIS — Z1231 Encounter for screening mammogram for malignant neoplasm of breast: Secondary | ICD-10-CM

## 2023-12-30 NOTE — Telephone Encounter (Signed)
 Pharmacy Patient Advocate Encounter   Received notification from CoverMyMeds that prior authorization for QULIPTA  30MG  is required/requested.   Insurance verification completed.   The patient is insured through [employer] .   Per test claim: PA required; PA submitted to above mentioned insurance via CoverMyMeds Key/confirmation #/EOC [facility] Status is pending

## 2023-12-31 ENCOUNTER — Other Ambulatory Visit: Payer: Self-pay | Admitting: Podiatry

## 2023-12-31 ENCOUNTER — Emergency Department
Admission: EM | Admit: 2023-12-31 | Discharge: 2023-12-31 | Disposition: A | Discharge status: Home / Self Care | Payer: MEDICAID | Attending: Emergency Medicine | Admitting: Emergency Medicine

## 2023-12-31 ENCOUNTER — Emergency Department: Payer: MEDICAID

## 2023-12-31 ENCOUNTER — Other Ambulatory Visit (HOSPITAL_COMMUNITY): Payer: Self-pay

## 2023-12-31 ENCOUNTER — Other Ambulatory Visit: Payer: Self-pay

## 2023-12-31 DIAGNOSIS — E119 Type 2 diabetes mellitus without complications: Secondary | ICD-10-CM | POA: Diagnosis not present

## 2023-12-31 DIAGNOSIS — R42 Dizziness and giddiness: Secondary | ICD-10-CM | POA: Diagnosis present

## 2023-12-31 LAB — CBC
HCT: 39.6 % (ref 36.0–46.0)
Hemoglobin: 11.9 g/dL — ABNORMAL LOW (ref 12.0–15.0)
MCH: 29 pg (ref 26.0–34.0)
MCHC: 30.1 g/dL (ref 30.0–36.0)
MCV: 96.4 fL (ref 80.0–100.0)
Platelets: 172 K/uL (ref 150–400)
RBC: 4.11 MIL/uL (ref 3.87–5.11)
RDW: 12.5 % (ref 11.5–15.5)
WBC: 9.6 K/uL (ref 4.0–10.5)
nRBC: 0 % (ref 0.0–0.2)

## 2023-12-31 LAB — URINALYSIS, ROUTINE W REFLEX MICROSCOPIC
Bacteria, UA: NONE SEEN
Bilirubin Urine: NEGATIVE
Glucose, UA: >=500 mg/dL — AB
Hgb urine dipstick: NEGATIVE
Ketones, ur: NEGATIVE mg/dL
Leukocytes,Ua: NEGATIVE
Nitrite: NEGATIVE
Protein, ur: NEGATIVE mg/dL
Specific Gravity, Urine: 1.04 — ABNORMAL HIGH (ref 1.005–1.030)
pH: 6 (ref 5.0–8.0)

## 2023-12-31 LAB — COMPREHENSIVE METABOLIC PANEL WITH GFR
ALT: 24 U/L (ref 0–44)
AST: 23 U/L (ref 15–41)
Albumin: 3.7 g/dL (ref 3.5–5.0)
Alkaline Phosphatase: 90 U/L (ref 38–126)
Anion gap: 10 (ref 5–15)
BUN: 17 mg/dL (ref 6–20)
CO2: 24 mmol/L (ref 22–32)
Calcium: 8.7 mg/dL — ABNORMAL LOW (ref 8.9–10.3)
Chloride: 106 mmol/L (ref 98–111)
Creatinine, Ser: 0.98 mg/dL (ref 0.44–1.00)
GFR, Estimated: >60 mL/min (ref >60)
Glucose, Bld: 238 mg/dL — ABNORMAL HIGH (ref 70–99)
Potassium: 4.1 mmol/L (ref 3.5–5.1)
Sodium: 140 mmol/L (ref 135–145)
Total Bilirubin: 0.5 mg/dL (ref 0.0–1.2)
Total Protein: 6 g/dL — ABNORMAL LOW (ref 6.5–8.1)

## 2023-12-31 LAB — TROPONIN I (HIGH SENSITIVITY)
Troponin I (High Sensitivity): 4 ng/L (ref <18)
Troponin I (High Sensitivity): 5 ng/L

## 2023-12-31 MED ORDER — OXYCODONE-ACETAMINOPHEN 10-325 MG PO TABS: 1.0000 | ORAL_TABLET | ORAL | 0 refills | Status: DC | PRN

## 2023-12-31 MED ORDER — LACTATED RINGERS IV BOLUS
1000.0000 mL | Freq: Once | INTRAVENOUS | Status: AC
  Administered 2023-12-31: 1000 mL via INTRAVENOUS

## 2023-12-31 NOTE — ED Triage Notes (Signed)
 Pt to triage via w/c with no distress noted; st yesterday she noticed she was wobbly and dizzy; c/o weakness, diff remembering and feeling tired; st fell earlier and now with pain rt hip/knee/ankle

## 2023-12-31 NOTE — Telephone Encounter (Signed)
 Pharmacy Patient Advocate Encounter  Received notification from [facility] that Prior Authorization for QULIPTA  30MG  has been APPROVED from 7.16.25 to 7.16.26. Ran test claim, Copay is $4. This test claim was processed through [facility]- copay amounts may vary at other pharmacies due to pharmacy/plan contracts, or as the patient moves through the different stages of their insurance plan.   PA #/Case ID/Reference #: [id number]

## 2023-12-31 NOTE — Telephone Encounter (Addendum)
 Pharmacy Patient Advocate Encounter  Received notification from [facility] that Prior Authorization for EMGALITY  120MG  has been APPROVED from 7.14.25 to 7.14.26. Ran test claim, Copay is $4. This test claim was processed through [facility]- copay amounts may vary at other pharmacies due to pharmacy/plan contracts, or as the patient moves through the different stages of their insurance plan.  AUTHORIZATION# [id number]

## 2023-12-31 NOTE — Discharge Instructions (Addendum)
 Please follow-up with your doctor within the next Avril days for recheck/reevaluation.  Please drink plenty of fluids.  Return to the emergency department for any symptom personally concerning to yourself.  Please limit your Klonopin  use to only nighttime until you have been seen by your doctor.

## 2023-12-31 NOTE — ED Provider Notes (Signed)
 [facility] Provider Note    Event Date/Time   First MD Initiated Contact with Patient 12/31/23 0255     (approximate)  History   Chief Complaint: Dizziness  HPI  Andrea [name] is a 43 y.o. female with a past medical history of anxiety, bipolar, diabetes, gastric reflux, schizophrenia, presents to the emergency department with sensation of dizziness and confusion.  Patient states over the last week or so she has been experiencing a sensation of being off balance dizzy and lightheaded at times like she is going to pass out.  Husband is here with the patient states at times she is having difficulty with her memory and recently had a fall.  Patient was just recently started on BuSpar  and Klonopin  approximately 2 weeks ago per patient.  Patient denies any focal weakness or numbness of any arm or leg or speech difficulty.  Patient denies any recent fever cough vomiting or diarrhea but states she has had some congestion  Physical Exam   Triage Vital Signs: ED Triage Vitals  Encounter Vitals Group     BP 12/31/23 0022 105/74     Girls Systolic BP Percentile --      Girls Diastolic BP Percentile --      Boys Systolic BP Percentile --      Boys Diastolic BP Percentile --      Pulse Rate 12/31/23 0022 86     Resp 12/31/23 0022 18     Temp 12/31/23 0022 98 F (36.7 C)     Temp Source 12/31/23 0022 Oral     SpO2 12/31/23 0022 97 %     Weight --      Height --      Head Circumference --      Peak Flow --      Pain Score 12/31/23 0024 5     Pain Loc --      Pain Education --      Exclude from Growth Chart --     Most recent vital signs: Vitals:   12/31/23 0022 12/31/23 0305  BP: 105/74 99/70  Pulse: 86 81  Resp: 18 15  Temp: 98 F (36.7 C)   SpO2: 97% 95%    General: Awake, no distress.  CV:  Good peripheral perfusion.  Regular rate and rhythm  Resp:  Normal effort.  Equal breath sounds bilaterally.  Abd:  No distention.  Soft, nontender.  No  rebound or guarding.  ED Results / Procedures / Treatments   EKG  EKG viewed and interpreted by myself shows a normal sinus rhythm at 88 bpm with a narrow QRS, normal axis, normal intervals, no concerning ST changes.  RADIOLOGY  I have reviewed interpreted CT images.  No obvious bleed seen on my evaluation. Radiology has read the CT scan as negative   MEDICATIONS ORDERED IN ED: Medications  lactated ringers  bolus 1,000 mL (1,000 mLs Intravenous New Bag/Given 12/31/23 0321)     IMPRESSION / MDM / ASSESSMENT AND PLAN / ED COURSE  I reviewed the triage vital signs and the nursing notes.  Patient's presentation is most consistent with acute presentation with potential threat to life or bodily function.  Patient presents to the emergency department for dizziness/lightheadedness sensation.  Patient does have a borderline low blood pressure currently 99/70.  Vital signs otherwise reassuring.  No concerning findings on physical exam.  CBC is normal, chemistry is normal.  Troponin negative x 2.  CT scan of the head shows no  significant finding and EKG is reassuring.  Given the patient's borderline low blood pressure we will IV hydrate with a liter of fluid.  Given her dizziness and off-balance sensation I discussed with the patient proceeding with an MRI of the brain as a precaution however she is very reluctant to do this due to severe claustrophobia.  Discussed anxiolytic medication prior to MRI but the patient still wishes to hold off on MRI imaging, given no focal deficits and an otherwise reassuring exam I believe this is reasonable.  Patient did just start BuSpar  as well as Klonopin  2 weeks ago, her symptoms started shortly afterwards.  Symptoms very well could be related to these medications.  I discussed with the patient only taking Klonopin  at night as it may make her fatigued dizzy or loopy during the day.  Patient agreeable to this plan.  Will follow-up with her doctor.  Will reassess  after fluids.  I would like to check a urine sample as well to rule out urinary tract infection.  Patient's urinalysis is normal.  Troponin is negative.  Patient is feeling better after IV fluids current blood pressure 107/71.  I again discussed an MRI with the patient, she would like to hold off.  She will follow-up with her doctor.  FINAL CLINICAL IMPRESSION(S) / ED DIAGNOSES   Dizziness  Note:  This document was prepared using Dragon voice recognition software and may include unintentional dictation errors.   Dorothyann Drivers, MD 12/31/23 [phone]

## 2024-01-01 ENCOUNTER — Telehealth: Payer: Self-pay | Admitting: Neurology

## 2024-01-01 LAB — HM DIABETES EYE EXAM

## 2024-01-01 NOTE — Telephone Encounter (Signed)
 Notified pt Rx Emgality  and Qulipta  is been approved. Pt agreed to call the pharmacy.

## 2024-01-01 NOTE — Telephone Encounter (Signed)
 Pt called in and left a message. She has been trying for 2 weeks to get prior auth for 2 of her medications and she still has not gotten it.

## 2024-01-05 ENCOUNTER — Encounter: Payer: Self-pay | Admitting: Nurse Practitioner

## 2024-01-06 ENCOUNTER — Other Ambulatory Visit: Payer: Self-pay

## 2024-01-06 ENCOUNTER — Emergency Department
Admission: EM | Admit: 2024-01-06 | Discharge: 2024-01-06 | Disposition: A | Discharge status: Home / Self Care | Payer: MEDICAID | Attending: Emergency Medicine | Admitting: Emergency Medicine

## 2024-01-06 ENCOUNTER — Ambulatory Visit
Admission: EM | Admit: 2024-01-06 | Discharge: 2024-01-06 | Disposition: A | Discharge status: Home / Self Care | Payer: MEDICAID | Attending: Emergency Medicine | Admitting: Emergency Medicine

## 2024-01-06 ENCOUNTER — Encounter: Payer: Self-pay | Admitting: Emergency Medicine

## 2024-01-06 ENCOUNTER — Emergency Department: Payer: MEDICAID

## 2024-01-06 DIAGNOSIS — N898 Other specified noninflammatory disorders of vagina: Secondary | ICD-10-CM | POA: Insufficient documentation

## 2024-01-06 DIAGNOSIS — R258 Other abnormal involuntary movements: Secondary | ICD-10-CM | POA: Diagnosis present

## 2024-01-06 DIAGNOSIS — R569 Unspecified convulsions: Secondary | ICD-10-CM

## 2024-01-06 DIAGNOSIS — R350 Frequency of micturition: Secondary | ICD-10-CM | POA: Diagnosis not present

## 2024-01-06 LAB — POCT URINALYSIS DIP (MANUAL ENTRY)
Bilirubin, UA: NEGATIVE
Glucose, UA: 500 mg/dL — AB
Ketones, POC UA: NEGATIVE mg/dL
Leukocytes, UA: NEGATIVE
Nitrite, UA: NEGATIVE
Spec Grav, UA: 1.015 (ref 1.010–1.025)
Urobilinogen, UA: 0.2 U/dL
pH, UA: 6 (ref 5.0–8.0)

## 2024-01-06 LAB — CBC WITH DIFFERENTIAL/PLATELET
Abs Immature Granulocytes: 0.04 K/uL (ref 0.00–0.07)
Basophils Absolute: 0.1 K/uL (ref 0.0–0.1)
Basophils Relative: 1 %
Eosinophils Absolute: 0.3 K/uL (ref 0.0–0.5)
Eosinophils Relative: 5 %
HCT: 39.2 % (ref 36.0–46.0)
Hemoglobin: 12 g/dL (ref 12.0–15.0)
Immature Granulocytes: 1 %
Lymphocytes Relative: 42 %
Lymphs Abs: 2.5 K/uL (ref 0.7–4.0)
MCH: 29.4 pg (ref 26.0–34.0)
MCHC: 30.6 g/dL (ref 30.0–36.0)
MCV: 96.1 fL (ref 80.0–100.0)
Monocytes Absolute: 0.5 K/uL (ref 0.1–1.0)
Monocytes Relative: 9 %
Neutro Abs: 2.5 K/uL (ref 1.7–7.7)
Neutrophils Relative %: 42 %
Platelets: 211 K/uL (ref 150–400)
RBC: 4.08 MIL/uL (ref 3.87–5.11)
RDW: 12.5 % (ref 11.5–15.5)
WBC: 6 K/uL (ref 4.0–10.5)
nRBC: 0 % (ref 0.0–0.2)

## 2024-01-06 LAB — COMPREHENSIVE METABOLIC PANEL WITH GFR
ALT: 22 U/L (ref 0–44)
AST: 22 U/L (ref 15–41)
Albumin: 3.4 g/dL — ABNORMAL LOW (ref 3.5–5.0)
Alkaline Phosphatase: 94 U/L (ref 38–126)
Anion gap: 11 (ref 5–15)
BUN: 20 mg/dL (ref 6–20)
CO2: 27 mmol/L (ref 22–32)
Calcium: 9.1 mg/dL (ref 8.9–10.3)
Chloride: 105 mmol/L (ref 98–111)
Creatinine, Ser: 0.93 mg/dL (ref 0.44–1.00)
GFR, Estimated: >60 mL/min (ref >60)
Glucose, Bld: 219 mg/dL — ABNORMAL HIGH (ref 70–99)
Potassium: 3.7 mmol/L (ref 3.5–5.1)
Sodium: 143 mmol/L (ref 135–145)
Total Bilirubin: 0.5 mg/dL (ref 0.0–1.2)
Total Protein: 5.9 g/dL — ABNORMAL LOW (ref 6.5–8.1)

## 2024-01-06 LAB — TROPONIN I (HIGH SENSITIVITY): Troponin I (High Sensitivity): 7 ng/L (ref <18)

## 2024-01-06 MED ORDER — FLUCONAZOLE 150 MG PO TABS: 150.0000 mg | ORAL_TABLET | ORAL | 0 refills | Status: AC

## 2024-01-06 MED ORDER — METRONIDAZOLE 500 MG PO TABS: 500.0000 mg | ORAL_TABLET | Freq: Two times a day (BID) | ORAL | 0 refills | Status: DC

## 2024-01-06 NOTE — ED Triage Notes (Signed)
 BIBA due to seizure from home at 0330am witnessed by family. Pt stated she started jerking and fell forward and hit her head on the wall.  Not medicated for seizures.  Per EMS:  BG 202 BP 118/60 HR 80's 100 RA 18g LAC

## 2024-01-06 NOTE — ED Provider Notes (Signed)
 Andrea [name]    CSN: [id number] Arrival date & time: 01/06/24  1754      History   Chief Complaint Chief Complaint  Patient presents with   Dysuria    HPI Andrea [name] is a 43 y.o. female.   Patient presents for evaluation of urinary frequency, dysuria, Andrea [name] thick vaginal discharge which itching and odor and bilateral low back pain beginning 3 to 4 days ago.  Sexually active, endorses monogamy, no known exposure.  Has not attempted treatment.  Denies hematuria, fever.  Past Medical History:  Diagnosis Date   Allergy    Anemia    Anxiety    Arthritis    Asthma    Bipolar 1 disorder (HCC)    Bipolar 1 disorder, mixed, severe (HCC) 12/20/2016   Bipolar I disorder, most recent episode depressed (HCC) 12/20/2016   Complication of anesthesia    hard time waking up    Depression    Diabetes mellitus without complication (HCC)    Drug induced akathisia 04/02/2022   GERD (gastroesophageal reflux disease)    no meds   Hypertension    Kidney stones    Long term current use of antipsychotic medication 03/13/2022   Low back pain    Migraines    Neuromuscular disorder (HCC)    PCOS (polycystic ovarian syndrome)    PONV (postoperative nausea and vomiting)    Schizophrenia (HCC)    Seizure (HCC) 10/03/2021   Seizures (HCC) 06/04/2016   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[facility] 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   Shortness of breath dyspnea    with bronchitis    Patient Active Problem List   Diagnosis Date Noted   Elevated blood pressure reading 12/26/2023   Schizoaffective disorder, bipolar type (HCC) 12/05/2023   Trimalleolar fracture of ankle, closed, left, initial encounter 10/23/2023   Acute right-sided low back pain without sciatica 07/18/2023   Acute right flank pain 07/18/2023   Fluctuating blood pressure 07/18/2023   Hypotension 07/02/2023   Occasional tremors 07/02/2023   Constipation 07/02/2023   Diabetes mellitus treated with oral medication (HCC) 07/02/2023    Syncope due to orthostatic hypotension 06/20/2023   Obesity (BMI 30-39.9) 05/14/2023   Acquired varus deformity of right ankle 05/13/2023   Postoperative surgical complication involving musculoskeletal system associated with musculoskeletal procedure 05/12/2023   Closed right ankle fracture 05/03/2023   Bipolar 1 disorder (HCC) 05/03/2023   Type 2 diabetes mellitus with peripheral neuropathy (HCC) 05/03/2023   Essential hypertension 05/03/2023   Mild persistent asthma 05/03/2023   Pre-syncope 05/03/2023   Chronic pain 07/16/2022   PTSD (post-traumatic stress disorder) 03/13/2022   Generalized anxiety disorder with panic attacks 03/13/2022   Major depressive disorder, 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 03/13/2022   Other insomnia 03/13/2022   Long term current use of antipsychotic medication 03/13/2022   Polypharmacy 03/13/2022   Convulsion (HCC) 11/26/2021   Irregular periods 09/26/2020   Uncontrolled type 2 diabetes mellitus with hyperglycemia, with long-term current use of insulin  (HCC) 09/18/2016   HTN (hypertension) 09/18/2016   Pelvic pain in female 11/14/2015   Acute pyelonephritis 01/16/2013   Migraines 10/30/2012   Obesity, Class III, BMI 40-49.9 (morbid obesity) (HCC) 10/30/2012   Kidney stones     Past Surgical History:  Procedure Laterality Date   ABDOMINAL HYSTERECTOMY N/A 11/14/2015   Procedure: HYSTERECTOMY ABDOMINAL;  Surgeon: Oneil FORBES Piety, MD;  Location: [facility];  Service: Gynecology;  Laterality: N/A;  ANKLE FUSION Right 05/13/2023   Procedure: ARTHRODESIS ANKLE;  Surgeon: Malvin Marsa FALCON, DPM;  Location: [facility];  Service: Orthopedics/Podiatry;  Laterality: Right;  Hardware removal, right ankle tibiot talo calcaneal arthrodesis   CHOLECYSTECTOMY     FRACTURE SURGERY     INTRAUTERINE DEVICE (IUD) INSERTION  06/14/2014   [facility]   LYMPH NODES  REMOVED     ORIF ANKLE FRACTURE Right 05/03/2023   Procedure: OPEN REDUCTION INTERNAL FIXATION (ORIF) ANKLE FRACTURE;  Surgeon: Silva Juliene SAUNDERS, DPM;  Location: [facility];  Service: Orthopedics/Podiatry;  Laterality: Right;   ovarian cyst removed     SALPINGOOPHORECTOMY Bilateral 11/14/2015   Procedure: SALPINGO OOPHORECTOMY;  Surgeon: Oneil FORBES Piety, MD;  Location: [facility];  Service: Gynecology;  Laterality: Bilateral;    OB History   No obstetric history on file.      Home Medications    Prior to Admission medications   Medication Sig Start Date End Date Taking? Authorizing Provider  fluconazole  (DIFLUCAN ) 150 MG tablet Take 1 tablet (150 mg total) by mouth once a week for 2 doses. 01/06/24 01/14/24 Yes Toluwanimi Radebaugh R, NP  metroNIDAZOLE  (FLAGYL ) 500 MG tablet Take 1 tablet (500 mg total) by mouth 2 (two) times daily. 01/06/24  Yes Teresa Shelba SAUNDERS, NP  Albuterol -Budesonide  (AIRSUPRA) 90-80 MCG/ACT AERO  04/01/23   [provider]  Atogepant  (QULIPTA ) 30 MG TABS Take 1 tablet daily 09/24/23   Georjean Darice HERO, MD  busPIRone  (BUSPAR ) 7.5 MG tablet Take 1 tablet (7.5 mg total) by mouth 2 (two) times daily. 12/05/23   Gretel App, NP  cetirizine  (ZYRTEC ) 10 MG tablet Take 10 mg by mouth at bedtime. 11/05/23   [provider]  clonazePAM  (KLONOPIN ) 0.5 MG tablet Take 1 tablet (0.5 mg total) by mouth 2 (two) times daily as needed for anxiety. 12/26/23   Bair, Kalpana, MD  DULoxetine  (CYMBALTA ) 60 MG capsule Take 2 capsules (120 mg total) by mouth daily. 10/13/23   Barbra Jayson LABOR, MD  eletriptan  (RELPAX ) 40 MG tablet Take 1 tablet at onset of migraine. May repeat in 2 hours if headache persists or recurs. Do not take more than 3 a week 07/23/23   Georjean Darice HERO, MD  famotidine  (PEPCID ) 40 MG tablet Take 1 tablet (40 mg total) by mouth 2 (two) times daily. 07/28/23   Kaur, Charanpreet, NP  gabapentin  (NEURONTIN ) 600 MG tablet Take 1 tablet (600 mg total) by mouth 3 (three) times  daily. 12/09/23   Standiford, Marsa FALCON, DPM  Galcanezumab -gnlm (EMGALITY ) 120 MG/ML SOAJ INJECT 1 PEN INTO THE SKIN EVERY 30 (THIRTY) DAYS. 12/25/23   Georjean Darice HERO, MD  GEMTESA 75 MG TABS Take 1 tablet by mouth daily. 01/02/23   [provider]  JARDIANCE  25 MG TABS tablet Take 1 tablet (25 mg total) by mouth every morning. 10/13/23   Kaur, Charanpreet, NP  montelukast  (SINGULAIR ) 10 MG tablet TAKE 1 TABLET BY MOUTH EVERYDAY AT BEDTIME 12/29/23   Kaur, Charanpreet, NP  ondansetron  (ZOFRAN -ODT) 4 MG disintegrating tablet Take 2 tablets (8 mg total) by mouth every 8 (eight) hours as needed for nausea or vomiting. 09/06/23   Corlis Burnard DEL, NP  oxyCODONE -acetaminophen  (PERCOCET) 10-325 MG tablet Take 1 tablet by mouth every 4 (four) hours as needed for pain. 12/31/23   Standiford, Marsa FALCON, DPM  pantoprazole  (PROTONIX ) 40 MG tablet Take 1 tablet (40 mg total) by mouth every morning. 07/02/23   Kaur, Charanpreet, NP  potassium chloride  (MICRO-K ) 10 MEQ  CR capsule Take 10 mEq by mouth daily. 10/09/22   [provider]  QUEtiapine  (SEROQUEL ) 50 MG tablet Take 1 tablet in the morning, afternoon and 2 at night. 10/13/23   Barbra Jayson LABOR, MD  Semaglutide ,0.25 or 0.5MG /DOS, 2 MG/3ML SOPN Inject 0.25 mg into the skin once a week. 10/09/23   Kaur, Charanpreet, NP  Semaglutide ,0.25 or 0.5MG /DOS, 2 MG/3ML SOPN Inject 0.25 mg into the skin once a week. 10/09/23   Kaur, Charanpreet, NP  SYMBICORT 160-4.5 MCG/ACT inhaler Inhale 2 puffs into the lungs. 08/13/22   [provider]  tiZANidine  (ZANAFLEX ) 4 MG tablet Take 4 mg by mouth every 8 (eight) hours as needed. 07/28/23   [provider]  traZODone  (DESYREL ) 150 MG tablet Take 1 tablet (150 mg total) by mouth at bedtime. 09/04/23   Vincente Saber, NP  zonisamide  (ZONEGRAN ) 100 MG capsule Take 5 capsules every night 12/06/22   Georjean Darice HERO, MD    Family History Family History  Problem Relation Age of Onset   Miscarriages /  Stillbirths Mother    Hypertension Mother    Heart disease Mother    Hypertension Father    Healthy Sister    Healthy Brother    Depression Maternal Aunt    Depression Maternal Aunt    Diabetes Maternal Aunt     Social History Social History   Tobacco Use   Smoking status: Never   Smokeless tobacco: Never  Vaping Use   Vaping status: Never Used  Substance Use Topics   Alcohol  use: Never   Drug use: Never     Allergies   Bactrim [sulfamethoxazole -trimethoprim] and Codeine   Review of Systems Review of Systems   Physical Exam Triage Vital Signs ED Triage Vitals [01/06/24 1812]  Encounter Vitals Group     BP 124/89     Girls Systolic BP Percentile      Girls Diastolic BP Percentile      Boys Systolic BP Percentile      Boys Diastolic BP Percentile      Pulse Rate 84     Resp 18     Temp 98.2 F (36.8 C)     Temp src      SpO2 93 %     Weight      Height      Head Circumference      Peak Flow      Pain Score 8     Pain Loc      Pain Education      Exclude from Growth Chart    No data found.  Updated Vital Signs BP 124/89   Pulse 84   Temp 98.2 F (36.8 C)   Resp 18   LMP  (LMP Unknown)   SpO2 93%   Visual Acuity Right Eye Distance:   Left Eye Distance:   Bilateral Distance:    Right Eye Near:   Left Eye Near:    Bilateral Near:     Physical Exam Constitutional:      Appearance: Normal appearance.  Eyes:     Extraocular Movements: Extraocular movements intact.  Pulmonary:     Effort: Pulmonary effort is normal.  Abdominal:     Tenderness: There is no abdominal tenderness. There is no right CVA tenderness, left CVA tenderness or guarding.  Genitourinary:    Comments: deferred Neurological:     Mental Status: She is alert.      UC Treatments / Results  Labs (all labs ordered are  listed, but only abnormal results are displayed) Labs Reviewed  POCT URINALYSIS DIP (MANUAL ENTRY) - Abnormal; Notable for the following components:       Result Value   Clarity, UA cloudy (*)    Glucose, UA =500 (*)    Blood, UA small (*)    Protein Ur, POC trace (*)    All other components within normal limits  URINE CULTURE  CERVICOVAGINAL ANCILLARY ONLY    EKG   Radiology CT Head Wo Contrast Result Date: 01/06/2024 EXAM: CT HEAD WITHOUT CONTRAST 01/06/2024 04:35:04 AM TECHNIQUE: CT of the head was performed without the administration of intravenous contrast. Automated exposure control, iterative reconstruction, and/or weight based adjustment of the mA/kV was utilized to reduce the radiation dose to as low as reasonably achievable. COMPARISON: CT head without contrast examination of 12/31/2023. MR head without contrast 02/05/2005. Report of MR head without contrast from [facility] 03/01/2024. CLINICAL HISTORY: Syncope/presyncope, cerebrovascular cause suspected. Patient brought in by ambulance due to seizure at home witnessed by family. Patient stated she started jerking and fell forward, hitting her head on the wall. Not medicated for seizures. FINDINGS: BRAIN AND VENTRICLES: No acute hemorrhage. Gray-Gualberto Wahlen differentiation is preserved. No hydrocephalus. No extra-axial collection. No mass effect or midline shift. ORBITS: No acute abnormality. SINUSES: No acute abnormality. SOFT TISSUES AND SKULL: No acute soft tissue abnormality. No skull fracture. IMPRESSION: 1. No acute intracranial abnormality. Electronically signed by: Lonni Necessary MD 01/06/2024 04:44 AM EDT RP Workstation: [id number]    Procedures Procedures (including critical care time)  Medications Ordered in UC Medications - No data to display  Initial Impression / Assessment and Plan / UC Course  I have reviewed the triage vital signs and the nursing notes.  Pertinent labs & imaging results that were available during my care of the patient were reviewed by me and considered in my medical decision making (see chart for details).  Vaginal discharge, urinary  frequency  Urinalysis negative, sent for culture, will initiate antibiotics based on results, discussed with patient, based on symptomology etiology most likely related to bacterial vaginosis and/or yeast, empirically treating, prescribed metronidazole  and Diflucan  and discussed administration advised against use of alcohol  during treatment, advised abstinence until lab results, treatment is complete and all symptoms have resolved Final Clinical Impressions(s) / UC Diagnoses   Final diagnoses:  Urinary frequency  Vaginal discharge     Discharge Instructions      Today you are being treated bacterial vaginosis and yeast  Urinalysis is negative for infection, has been sent to the lab to see if bacteria will grow, you will be notified if this occurs and he will be started on additional medicine  Take Metronidazole  500 mg twice a day for 7 days, do not drink alcohol  while using medication, this will make you feel sick   Take 1 Diflucan  tablet when you receive your medicine then take second dose in 1 week after completion of antibiotics  Labs pending 2-3 days, you will be contacted if positive for any sti and treatment will be sent to the pharmacy, you will have to return to the clinic if positive for gonorrhea to receive treatment   Please refrain from having sex until labs results, if positive please refrain from having sex until treatment complete and symptoms resolve   If positive for , Chlamydia  gonorrhea or trichomoniasis please notify partner or partners so they may tested as well   In addition: Avoid baths, hot tubs and whirlpool spas.  Don't use  scented or harsh soaps Avoid irritants. These include scented tampons and pads. Wipe from front to back after using the toilet. Don't douche. Your vagina doesn't require cleansing other than normal bathing.  Use a condom.  Wear cotton underwear, this fabric absorbs some moisture.        ED Prescriptions     Medication Sig  Dispense Auth. Provider   metroNIDAZOLE  (FLAGYL ) 500 MG tablet Take 1 tablet (500 mg total) by mouth 2 (two) times daily. 14 tablet Zacarias Krauter R, NP   fluconazole  (DIFLUCAN ) 150 MG tablet Take 1 tablet (150 mg total) by mouth once a week for 2 doses. 2 tablet Shameria Trimarco R, NP      PDMP not reviewed this encounter.   Teresa Shelba SAUNDERS, [place] 01/06/24 [phone]

## 2024-01-06 NOTE — ED Provider Notes (Signed)
 [facility] Provider Note    Event Date/Time   First MD Initiated Contact with Patient 01/06/24 [phone]     (approximate)   History   Seizures   HPI  Andrea [name] is a 43 y.o. female past medical history significant for functional neurologic disorder, anxiety, presents to the emergency department with concern for syncope.  History is provided by EMS and then her boyfriend at bedside.  States that she has been having more frequent episodes where her whole body just shakes.  States that she will be awake during the whole time and she will just get a jolt to her and tie her body.  States that she went to the bathroom today and felt like she had a jolt to her entire body and then fell forward hitting her head.  Does believe she peed on herself.  Whenever her boyfriend walked into the bathroom stated that she had voluntary shaking all over and that she was awake and oriented at that time.  Does state that she takes zonegran  and uncertain if they are for seizures.  Does state that she has been followed by neurology in the past.  Denies feeling poorly earlier today.  No fever, chills, nausea, vomiting, cough, shortness of breath or dysuria not on anticoagulation.  Does state that she hit her head.     Physical Exam   Triage Vital Signs: ED Triage Vitals  Encounter Vitals Group     BP 01/06/24 0357 104/66     Girls Systolic BP Percentile --      Girls Diastolic BP Percentile --      Boys Systolic BP Percentile --      Boys Diastolic BP Percentile --      Pulse Rate 01/06/24 0357 89     Resp 01/06/24 0357 16     Temp 01/06/24 0359 97.6 F (36.4 C)     Temp Source 01/06/24 0359 Oral     SpO2 01/06/24 0357 96 %     Weight 01/06/24 0358 293 lb 3.4 oz (133 kg)     Height 01/06/24 0358 5' 11 (1.803 m)     Head Circumference --      Peak Flow --      Pain Score 01/06/24 0358 0     Pain Loc --      Pain Education --      Exclude from Growth Chart --     Most  recent vital signs: Vitals:   01/06/24 0359 01/06/24 0700  BP:  (!) 146/87  Pulse:  75  Resp:  13  Temp: 97.6 F (36.4 C)   SpO2:  93%    Physical Exam Constitutional:      Appearance: She is well-developed.  HENT:     Head: Atraumatic.  Eyes:     Conjunctiva/sclera: Conjunctivae normal.  Cardiovascular:     Rate and Rhythm: Regular rhythm.  Pulmonary:     Effort: No respiratory distress.  Abdominal:     General: There is no distension.     Tenderness: There is no abdominal tenderness.  Musculoskeletal:        General: Normal range of motion.     Cervical back: Normal range of motion. No tenderness.     Right lower leg: No edema.     Left lower leg: No edema.  Skin:    General: Skin is warm.     Capillary Refill: Capillary refill takes less than 2 seconds.  Neurological:  General: No focal deficit present.     Mental Status: She is alert and oriented to person, place, and time. Mental status is at baseline.     GCS: GCS eye subscore is 4. GCS verbal subscore is 5. GCS motor subscore is 6.     Cranial Nerves: Cranial nerves 2-12 are intact.     Sensory: Sensation is intact.     Motor: Motor function is intact.     Coordination: Coordination is intact.     IMPRESSION / MDM / ASSESSMENT AND PLAN / ED COURSE  I reviewed the triage vital signs and the nursing notes.  Differential diagnosis including seizure, nonepileptic seizure, electrolyte abnormality, dehydration, dysrhythmia, syncope  EKG  I, Clotilda Punter, the attending physician, personally viewed and interpreted this ECG.   Rate: Normal  Rhythm: Normal sinus  Axis: Normal  Intervals: Normal  ST&T Change: None No significant change when compared to prior EKG  No tachycardic or bradycardic dysrhythmias while on cardiac telemetry.  RADIOLOGY CT scan of the head with no acute findings  LABS (all labs ordered are listed, but only abnormal results are displayed) Labs interpreted as -    Labs  Reviewed  COMPREHENSIVE METABOLIC PANEL WITH GFR - Abnormal; Notable for the following components:      Result Value   Glucose, Bld 219 (*)    Total Protein 5.9 (*)    Albumin 3.4 (*)    All other components within normal limits  CBC WITH DIFFERENTIAL/PLATELET  POC URINE PREG, ED  TROPONIN I (HIGH SENSITIVITY)     MDM  Lab work overall reassuring.  EKG and troponin reassuring.  No chest pain or shortness of breath.  Have low suspicion for dysrhythmia.  No findings of an electrolyte abnormality.  CT scan of the head without acute findings.  No headache and no thunderclap headache have a low suspicion for subarachnoid hemorrhage.  No concern for meningitis, no fever, no meningismus on exam.  Patient Dors is compliance with all of her home medications.  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  Patient is back to baseline.  Patient does take sonogram for questionable seizure disorder.  Discussed close follow-up with neurology and she has an appointment on Monday according to her boyfriend at bedside.  Discussed return for any ongoing or worsening symptoms.  Difficult to say whether this was a breakthrough seizure versus a nonepileptic seizure given that the patient was conscious during that time, possible partial seizure.  Discussed seizure precautions.  No questions at time of discharge.     PROCEDURES:  Critical Care performed: No  Procedures  Patient's presentation is most consistent with acute presentation with potential threat to life or bodily function.   MEDICATIONS ORDERED IN ED: Medications - No data to display  FINAL CLINICAL IMPRESSION(S) / ED DIAGNOSES   Final diagnoses:  Seizure-like activity (HCC)     Rx / DC Orders   ED Discharge Orders     None        Note:  This document was prepared using Dragon voice recognition software and may include unintentional dictation  errors.   Punter Clotilda, MD 01/06/24 [phone]

## 2024-01-06 NOTE — ED Notes (Signed)
 Pt went to CT

## 2024-01-06 NOTE — Discharge Instructions (Signed)
 You are seen in the emergency department after concern for possible seizure.  Follow-up closely with your neurologist.  Continue to take your medications as prescribed.  Return to the emergency department for any worsening symptoms.  Seizure precautions: Do not do anything that may harm yourself or others if you were to have a seizure while doing it.  This means no driving, no operating any heavy machinery, no heights, no swimming alone. Seizure precautions for 6 months or until you are cleared by a neurologist.  Call to schedule close follow up/out-patient follow up.

## 2024-01-06 NOTE — ED Notes (Signed)
 Pt is in room from CT.

## 2024-01-06 NOTE — ED Triage Notes (Signed)
 Patient to Urgent Care with complaints of  lower back pain and dysuria. Denies any fevers.  Symptoms x3-4 days.

## 2024-01-06 NOTE — Discharge Instructions (Signed)
 Today you are being treated bacterial vaginosis and yeast  Urinalysis is negative for infection, has been sent to the lab to see if bacteria will grow, you will be notified if this occurs and he will be started on additional medicine  Take Metronidazole  500 mg twice a day for 7 days, do not drink alcohol  while using medication, this will make you feel sick   Take 1 Diflucan  tablet when you receive your medicine then take second dose in 1 week after completion of antibiotics  Labs pending 2-3 days, you will be contacted if positive for any sti and treatment will be sent to the pharmacy, you will have to return to the clinic if positive for gonorrhea to receive treatment   Please refrain from having sex until labs results, if positive please refrain from having sex until treatment complete and symptoms resolve   If positive for , Chlamydia  gonorrhea or trichomoniasis please notify partner or partners so they may tested as well   In addition: Avoid baths, hot tubs and whirlpool spas.  Don't use scented or harsh soaps Avoid irritants. These include scented tampons and pads. Wipe from front to back after using the toilet. Don't douche. Your vagina doesn't require cleansing other than normal bathing.  Use a condom.  Wear cotton underwear, this fabric absorbs some moisture.

## 2024-01-07 ENCOUNTER — Ambulatory Visit: Payer: MEDICAID | Admitting: Physician Assistant

## 2024-01-07 LAB — CERVICOVAGINAL ANCILLARY ONLY
Bacterial Vaginitis (gardnerella): POSITIVE — AB
Candida Glabrata: POSITIVE — AB
Candida Vaginitis: POSITIVE — AB
Chlamydia: NEGATIVE
Comment: NEGATIVE
Comment: NEGATIVE
Comment: NEGATIVE
Comment: NEGATIVE
Comment: NEGATIVE
Comment: NORMAL
Neisseria Gonorrhea: NEGATIVE
Trichomonas: NEGATIVE

## 2024-01-08 ENCOUNTER — Ambulatory Visit (HOSPITAL_COMMUNITY): Payer: Self-pay

## 2024-01-08 LAB — URINE CULTURE: Culture: >=100000 — AB

## 2024-01-08 MED ORDER — AMOXICILLIN-POT CLAVULANATE ER 1000-62.5 MG PO TB12
2.0000 | ORAL_TABLET | Freq: Two times a day (BID) | ORAL | 0 refills | Status: AC
Start: 2024-01-08 — End: 2024-01-15

## 2024-01-08 NOTE — Telephone Encounter (Signed)
 Patient has confirmed ESBL E. coli, Macrobid  is not appropriate for treatment.  Because bacteria is sensitive to ampicillin sulbactam, Augmentin  is appropriate.  Prescription sent to pharmacy on record.

## 2024-01-10 ENCOUNTER — Encounter: Payer: Self-pay | Admitting: Podiatry

## 2024-01-12 ENCOUNTER — Other Ambulatory Visit: Payer: Self-pay | Admitting: Nurse Practitioner

## 2024-01-12 ENCOUNTER — Encounter: Payer: Self-pay | Admitting: Neurology

## 2024-01-12 ENCOUNTER — Ambulatory Visit: Payer: MEDICAID | Admitting: Neurology

## 2024-01-12 ENCOUNTER — Other Ambulatory Visit: Payer: Self-pay | Admitting: Podiatry

## 2024-01-12 VITALS — BP 139/92 | HR 78 | Ht 71.0 in | Wt 292.0 lb

## 2024-01-12 DIAGNOSIS — E1142 Type 2 diabetes mellitus with diabetic polyneuropathy: Secondary | ICD-10-CM

## 2024-01-12 DIAGNOSIS — F445 Conversion disorder with seizures or convulsions: Secondary | ICD-10-CM

## 2024-01-12 DIAGNOSIS — R42 Dizziness and giddiness: Secondary | ICD-10-CM | POA: Diagnosis not present

## 2024-01-12 DIAGNOSIS — R296 Repeated falls: Secondary | ICD-10-CM | POA: Diagnosis not present

## 2024-01-12 DIAGNOSIS — G43009 Migraine without aura, not intractable, without status migrainosus: Secondary | ICD-10-CM | POA: Diagnosis not present

## 2024-01-12 MED ORDER — ELETRIPTAN HYDROBROMIDE 40 MG PO TABS: ORAL_TABLET | ORAL | 5 refills | Status: AC

## 2024-01-12 MED ORDER — ZONISAMIDE 100 MG PO CAPS: ORAL_CAPSULE | ORAL | 11 refills | Status: DC

## 2024-01-12 MED ORDER — OXYCODONE-ACETAMINOPHEN 10-325 MG PO TABS: 1.0000 | ORAL_TABLET | ORAL | 0 refills | Status: DC | PRN

## 2024-01-12 MED ORDER — QULIPTA 30 MG PO TABS: ORAL_TABLET | ORAL | 11 refills | Status: DC

## 2024-01-12 NOTE — Patient Instructions (Signed)
 Good to see you.  Continue Qulipta , stop the Emgality . Refills also sent for as needed Relpax   2. Continue Zonisamide  500mg  every night  3. Continue to monitor symptoms, if dizziness and falls continue, we will proceed with brain MRI  4. Continue follow-up with Behavioral Health  5. Good luck with [facility] second opinion  6. Follow-up in 4 months, call for any changes

## 2024-01-12 NOTE — Progress Notes (Signed)
Refill of percocet sent per pt request

## 2024-01-12 NOTE — Progress Notes (Signed)
 NEUROLOGY FOLLOW UP OFFICE NOTE  Andrea [name] [id number] 02-23-1981  HISTORY OF PRESENT ILLNESS: I had the pleasure of seeing Andrea [name] in follow-up in the neurology clinic on 01/12/2027.  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 She is again accompanied by her fiance [name] who helps supplement the history today.  Records and images were personally reviewed where available.  She contacted our office in 02/2023 of worsened migraines despite Emgality , we discussed starting Qulipta  which was approved in 03/2023. She contacted our office in 07/2023 about an increase in migraines, with 3-4 days of migraines in a 2-week period. She had broken her ankle in November and had 2 major surgeries which were making her miserable. Sumatriptan  was not working, Relpax  was sent in. She continues on Zonisamide  500mg  at bedtime for migraine prophylaxis.  Since her last visit, she reports that she was taking both the Emgality  and Qulipta , last dose of Emgality  was 7/1. She has had an improvement in migraines, and feels Qulipta  helps better. She has 1-2 migraines a week, the Relpax  helps better than sumatriptan . Last migraine was 3 weeks ago. She presents today for new symptoms. She has not had the typical episodes of staring with body movements in a while. She was in the ER on 7/16 for dizziness, she reported that clonazepam  and buspirone  were started 2 weeks prior and feels they are helping. On 7/21, [name] got home from work at 11pm and they went to sleep. She woke up at 1:45am to use the bathroom, he then heard a thump and she hollered for him. He found her on the floor awake, she said I think I had a little seizure and fell off the toilet. She was on the floor and asked him to call EMS, then helped her get dressed. She was taken to [facility] where she reported she was having more frequent episodes where her body shakes while she is awake, she will get a jolt to her  entire body and that happened in the bathroom making her fall forward and hit her head. Head CT and bloodwork were normal. After she got home, she has had more falls. She feels really shaky and falls forward or backward. One time she fell backwards in between the toilet and tub. Sometimes she feels dizzy before a fall, other times there is no warning. [name] has seen the falls, he recalls the fall on 7/24, she was on the commode and he knew she was not 100%, feeling dizzy, he helped her off the toilet and she was gripping his arms and he was walking backwards helping her walk. She was talking then she dropped in front of him, no loss of consciousness. Her toes flexed in with that fall. She went to [facility] ER where she was found to have a UTI, currently still taking Flagyl .  She fell in November after she got dizzy reaching for something, BP was low, she fractured her right ankle and had 2 surgeries but bones are not healing well. She has a second opinion scheduled at [facility]. Her right foot is numb, she does not feel anything on the sole of her foot. She denies any paresthesias. No neck or back pain. No falls since 7/24. She has not felt dizzy lately, she just feels really fatigued and not herself. She gets around 6 hours of interrupted sleep. She takes Seroquel  for anxiety and depression and feels it is not really helping, she will see Psychiatry  soon. She does group therapy and feels it helps some.     I had the pleasure of seeing Andrea [name] in follow-up in the neurology clinic on 12/06/2022.  The patient was last seen 7 months ago for psychogenic non-epileptic events and migraines. She is alone in the office today. Records and images were personally reviewed where available. On her last visit, she reported bilateral hand weakness. She had an EMG/NCV of both upper extremities which was normal. For the migraines, she was started on Aimovig  which helped, however insurance would no longer cover it so  she was switched to Emgality  in January. She is also on Zonisamide  400mg  at bedtime and Gabapentin  300-300-900mg  for migraine prophylaxis. The Gabapentin  is also used for anxiety, sometimes she takes an additional 2 if needed for insomnia but has not done this often lately. She is happy to report that she has not had any seizures since her last visit 7 months ago. She had a syncopal episode on 10/04/22 in the setting of hypokalemia, she reports all of a sudden everything went black and she fell. She has been doing well since medications were switched. The Emgality  has been helpful, she was down to 3-4 a month, however the last couple of weeks she has had really bad migraines 3-4 times a week. Work has been more demanding, they have been working longer hours for the summer. She gets at least 6 hours of sleep. Mood is good.    History on Initial Assessment 08/08/2021: This is a pleasant 43 year old right-handed woman with a history of hypertension, DM, migraines, bipolar disorder, presenting for evaluation of new onset seizures. She is accompanied by her husband who helps supplement the history today. She was in her usual state of health until 07/31/21 while at work at [employer], she started feeling a little funny and sat down, then woke up in the hospital. Co-workers reported she got went, went pale white, then slid down to the floor and had a 4-minute episode of jerking with EMS noting post-ictal confusion. No tongue bite or incontinence. In the ER, her husband reports she had 2 more seizures, but ER notes indicate there was an episode of upper body jerking including head jerking lasting 15-20 seconds. Bloodwork showed creatinine 1.21, glucose 265. I personally reviewed MRI brain with and without contrast which was normal, hippocampi symmetric with no abnormal signal or enhancement seen. Her wake and drowsy EEG was normal. She had been on Gabapentin  for anxiety, dose was increased to 300mg  TID. She feels her left side  has been weaker since then, this has gotten better. Since hospital discharge, her husband reports more seizures. She had an unwitnessed one on 2/16 where she recalls going to the bathroom and coming out, then waking up on the ground, no injuries/tongue bite/incontinence. There was no prior warning. On 2/17, after eating a snack, she told her husband she felt funny and lay down, she then had another shaking episode lasting less than a minute. The last episode was on 2/18, they were dozing in bed, he felt her shaking and saw her lying on her left side. He thinks her eyes were closed, mouth open, it lasted less than 30 seconds, she woke up, then had another 30-second shaking episode. She had no memory of it and told her husband her head hurt and she felt very tired. Her husband denies any staring episodes. She denies any olfactory/gustatory hallucinations, deja vu, rising epigastric sensation, focal numbness/tingling. She has occasional jerks in her  legs. She used to have migraines that quieted down. She used to take Topamax  but it interacted with one of her medications. Since the initial seizure, she has had frontal throbbing headaches that recur, different from past headaches. She is sensitive to lights/sounds, no nausea/vomiting. Tylenol  helps sometimes but lately has not, she has been taking 2 650mg  tabs three times a day for the past week. She denies any diplopia, dysarthria/dysphagia, neck/back pain, bowel/bladder dysfunction. Her husband tried to check her glucose level with the last seizure, it was 271. She denies any sleep deprivation, she gets 6-8 hours of sleep with Doxepin . No recent change in stress levels. She is on Zyprexa  for mood, mood is good. She is not having a lot of anxiety anymore. She lives with her husband and works as a Insurance underwriter at [employer]. No alcohol  use. She had a normal birth and early development.  There is no history of febrile convulsions, CNS infections such as  meningitis/encephalitis, significant traumatic brain injury, neurosurgical procedures, or family history of seizures.  Update 01/16/2022: Since her last visit, she was admitted for EMU monitoring from June 12-16, 2023 where Gabapentin  and Zonisamide  were held. Baseline EEG was normal, typical events were not captured. Diagnosis of migraine with aura versus nonepileptic events were discussed with them. She did well event-free for 3 months until 7/29 when she had 2 seizures followed by chest pressure, so they went to the ER. BP was 95/56, EKG showed sinus rhythm, troponin negative. Since then, she has had 2 on 7/31, and another 1.5 hours ago. She feels really tired. No tongue bite or incontinence. Her husband shows 2 videos of her typical events. During one, she is lying on the bed with eyes closed, head bobbing up and down with irregular upper body movements, heavy breathing, unresponsive to husband afterwards. Another video shows her with her head bent down, her husband notes she started staring, then head slumped down and she started having side to side head movements as he lay her on the bed with upper body rocking, eyes closed.   Prior ASMs: Topiramate  Prior migraine preventative medications: Topiramate ,  Prior migraine rescue medications: sumatriptan , Relpax , Aimovig    PAST MEDICAL HISTORY: Past Medical History:  Diagnosis Date   Allergy    Anemia    Anxiety    Arthritis    Asthma    Bipolar 1 disorder (HCC)    Bipolar 1 disorder, mixed, severe (HCC) 12/20/2016   Bipolar I disorder, most recent episode depressed (HCC) 12/20/2016   Complication of anesthesia    hard time waking up    Depression    Diabetes mellitus without complication (HCC)    Drug induced akathisia 04/02/2022   GERD (gastroesophageal reflux disease)    no meds   Hypertension    Kidney stones    Long term current use of antipsychotic medication 03/13/2022   Low back pain    Migraines    Neuromuscular disorder (HCC)     PCOS (polycystic ovarian syndrome)    PONV (postoperative nausea and vomiting)    Schizophrenia (HCC)    Seizure (HCC) 10/03/2021   Seizures (HCC) 06/04/2016   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 28 tablet 0   Atogepant  (QULIPTA ) 30 MG TABS Take 1 tablet daily 30 tablet 5   busPIRone  (BUSPAR ) 7.5 MG tablet Take 1 tablet (7.5 mg total) by mouth 2 (two) times daily. 60 tablet 0   cetirizine  (ZYRTEC ) 10 MG tablet Take 10 mg by mouth at bedtime.     clonazePAM  (KLONOPIN ) 0.5 MG tablet Take 1 tablet (0.5 mg total) by mouth 2 (two) times daily as needed for anxiety. 60 tablet 0   DULoxetine  (CYMBALTA ) 60 MG capsule Take 2 capsules (120 mg total) by mouth daily. 180 capsule 0   eletriptan  (RELPAX ) 40 MG tablet Take 1 tablet at onset of migraine. May repeat in 2 hours if headache persists or recurs. Do not take more than 3 a week 10 tablet 5   famotidine  (PEPCID ) 40 MG tablet Take 1 tablet (40 mg total) by mouth 2 (two) times daily. 180 tablet 0   fluconazole  (DIFLUCAN ) 150 MG tablet Take 1 tablet (150 mg total) by mouth once a week for 2 doses. 2 tablet 0   gabapentin  (NEURONTIN ) 600 MG tablet Take 1 tablet (600 mg total) by mouth 3 (three) times daily. 90 tablet 3   Galcanezumab -gnlm (EMGALITY ) 120 MG/ML SOAJ INJECT 1 PEN INTO THE SKIN EVERY 30 (THIRTY) DAYS. 1 mL 0   GEMTESA 75 MG TABS Take 1 tablet by mouth daily.     JARDIANCE  25 MG TABS tablet Take 1 tablet (25 mg total) by mouth every morning. 90 tablet 3   metroNIDAZOLE  (FLAGYL ) 500 MG tablet Take 1 tablet (500 mg total) by mouth 2 (two) times daily. 14 tablet 0   montelukast  (SINGULAIR ) 10 MG  tablet TAKE 1 TABLET BY MOUTH EVERYDAY AT BEDTIME 90 tablet 0   ondansetron  (ZOFRAN -ODT) 4 MG disintegrating tablet Take 2 tablets (8 mg total) by mouth every 8 (eight) hours as needed for nausea or vomiting. 30 tablet 0   oxyCODONE -acetaminophen  (PERCOCET) 10-325 MG tablet Take 1 tablet by mouth every 4 (four) hours as needed for pain. 30 tablet 0   pantoprazole  (PROTONIX ) 40 MG tablet Take 1 tablet (40 mg total) by mouth every morning. 90 tablet 1   potassium chloride  (MICRO-K ) 10 MEQ CR capsule Take 10 mEq by mouth daily.     QUEtiapine  (SEROQUEL ) 50 MG tablet Take 1 tablet in the morning, afternoon and 2 at night. 120 tablet 2   Semaglutide ,0.25 or 0.5MG /DOS, 2 MG/3ML SOPN Inject 0.25 mg into the skin once a week. 3 mL 2   Semaglutide ,0.25 or 0.5MG /DOS, 2 MG/3ML SOPN Inject 0.25 mg into the skin once a week.     SYMBICORT 160-4.5 MCG/ACT inhaler Inhale 2 puffs into the lungs.     tiZANidine  (ZANAFLEX ) 4 MG tablet Take 4 mg by mouth every 8 (eight) hours as needed.     traZODone  (DESYREL ) 150 MG tablet Take 1 tablet (150 mg total) by mouth at bedtime. 90 tablet 1   zonisamide  (ZONEGRAN ) 100 MG capsule Take 5 capsules every night 150 capsule 11   No current facility-administered medications on file prior to visit.    ALLERGIES: Allergies  Allergen Reactions   Bactrim [Sulfamethoxazole -Trimethoprim] Hives and Itching   Codeine Hives and Itching    FAMILY HISTORY: Family History  Problem Relation Age of Onset   Miscarriages / Stillbirths Mother    Hypertension Mother    Heart disease Mother    Hypertension Father    Healthy Sister    Healthy  Brother    Depression Maternal Aunt    Depression Maternal Aunt    Diabetes Maternal Aunt     SOCIAL HISTORY: Social History   Socioeconomic History   Marital status: Divorced    Spouse name: Not on file   Number of children: 0   Years of education: 12   Highest education level: Some college, no degree  Occupational History   Not on  file  Tobacco Use   Smoking status: Never   Smokeless tobacco: Never  Vaping Use   Vaping status: Never Used  Substance and Sexual Activity   Alcohol  use: Never   Drug use: Never   Sexual activity: Yes    Partners: Male  Other Topics Concern   Not on file  Social History Narrative   Right handed   Drinks caffeine   One story home   Social Drivers of Health   Financial Resource Strain: High Risk (12/04/2023)   Overall Financial Resource Strain (CARDIA)    Difficulty of Paying Living Expenses: Hard  Food Insecurity: Food Insecurity Present (12/04/2023)   Hunger Vital Sign    Worried About Running Out of Food in the Last Year: Sometimes true    Ran Out of Food in the Last Year: Sometimes true  Transportation Needs: Unmet Transportation Needs (12/04/2023)   PRAPARE - Transportation    Lack of Transportation (Medical): No    Lack of Transportation (Non-Medical): Yes  Physical Activity: Inactive (12/04/2023)   Exercise Vital Sign    Days of Exercise per Week: 0 days    Minutes of Exercise per Session: Not on file  Stress: Stress Concern Present (12/04/2023)   [employer] of Occupational Health - Occupational Stress Questionnaire    Feeling of Stress: Very much  Social Connections: Moderately Integrated (12/04/2023)   Social Connection and Isolation Panel    Frequency of Communication with Friends and Family: More than three times a week    Frequency of Social Gatherings with Friends and Family: Once a week    Attends Religious Services: More than 4 times per year    Active Member of [employer] or Organizations: No    Attends Engineer, structural: Not on file    Marital Status: Living with partner  Intimate Partner Violence: Not At Risk (05/12/2023)   Humiliation, Afraid, Rape, and Kick questionnaire    Fear of Current or Ex-Partner: No    Emotionally Abused: No    Physically Abused: No    Sexually Abused: No     PHYSICAL EXAM: Vitals:   01/12/24 0821  BP: (!)  139/92  Pulse: 78  SpO2: 95%   General: No acute distress Head:  Normocephalic/atraumatic Skin/Extremities: No rash, no edema Neurological Exam: alert and awake. No aphasia or dysarthria. Fund of knowledge is appropriate. Attention and concentration are normal.   Cranial nerves: Pupils equal, round. Extraocular movements intact with no nystagmus. Visual fields full.  No facial asymmetry.  Motor: Bulk and tone normal, muscle strength 5/5 throughout with no pronator drift. Reflexes +1 throughout. Sensation intact to all modalities on both UE, decreased pin over mid-dorsum of right foot to toes, decreased cold on dorsum of foot and over toes. Decreased vibration sense to right ankle. Finger to nose testing intact.  Gait slow and cautious favoring right foot due to pain. No tremors.   IMPRESSION: This is a pleasant 43 yo RH woman with a history of hypertension, DM, migraines, bipolar disorder, with functional seizures (psychogenic non-epileptic events. Her 48-hour EEG and  5-day EMU admission showed normal baseline EEG, however typical events were not captured. 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 She has not had typical spells in a while and continues to follow-up with Behavioral Health. Migraines improved with Qulipta  and prn Relpax . She presents with new symptoms of dizziness and frequent falls, etiology unclear but may relate to recently diagnosed UTI. She denies any falls or dizziness since starting treatment for UTI.We agreed that if symptoms recur, we will proceed with brain MRI without contrast to assess for underlying structural abnormality. Continue follow-up with Behavioral Health and [facility] for second opinion on right foot injury. She is aware of [place] driving laws to stop driving after an episode of loss of awareness until 6 months event-free. Follow-up in 4 months, call for any changes.  Thank you for allowing me to participate in her care.  Please do not hesitate to call for any  questions or concerns.    Darice Shivers, M.D.   CC: Chelsea Aurora, NP

## 2024-01-14 DIAGNOSIS — S82891G Other fracture of right lower leg, subsequent encounter for closed fracture with delayed healing: Secondary | ICD-10-CM | POA: Insufficient documentation

## 2024-01-14 DIAGNOSIS — L97411 Non-pressure chronic ulcer of right heel and midfoot limited to breakdown of skin: Secondary | ICD-10-CM | POA: Insufficient documentation

## 2024-01-14 DIAGNOSIS — W19XXXS Unspecified fall, sequela: Secondary | ICD-10-CM | POA: Insufficient documentation

## 2024-01-15 ENCOUNTER — Other Ambulatory Visit: Payer: Self-pay | Admitting: Nurse Practitioner

## 2024-01-16 ENCOUNTER — Ambulatory Visit: Payer: MEDICAID | Admitting: Podiatry

## 2024-01-16 ENCOUNTER — Ambulatory Visit: Payer: MEDICAID | Admitting: Nurse Practitioner

## 2024-01-16 ENCOUNTER — Encounter: Payer: Self-pay | Admitting: Nurse Practitioner

## 2024-01-16 VITALS — BP 128/76 | HR 73 | Temp 97.6°F | Ht 71.0 in | Wt 291.0 lb

## 2024-01-16 DIAGNOSIS — F411 Generalized anxiety disorder: Secondary | ICD-10-CM

## 2024-01-16 DIAGNOSIS — I10 Essential (primary) hypertension: Secondary | ICD-10-CM

## 2024-01-16 DIAGNOSIS — Z7985 Long-term (current) use of injectable non-insulin antidiabetic drugs: Secondary | ICD-10-CM

## 2024-01-16 DIAGNOSIS — E538 Deficiency of other specified B group vitamins: Secondary | ICD-10-CM

## 2024-01-16 DIAGNOSIS — E559 Vitamin D deficiency, unspecified: Secondary | ICD-10-CM | POA: Diagnosis not present

## 2024-01-16 DIAGNOSIS — F250 Schizoaffective disorder, bipolar type: Secondary | ICD-10-CM

## 2024-01-16 DIAGNOSIS — F410 Panic disorder [episodic paroxysmal anxiety] without agoraphobia: Secondary | ICD-10-CM

## 2024-01-16 DIAGNOSIS — R399 Unspecified symptoms and signs involving the genitourinary system: Secondary | ICD-10-CM

## 2024-01-16 DIAGNOSIS — E1142 Type 2 diabetes mellitus with diabetic polyneuropathy: Secondary | ICD-10-CM

## 2024-01-16 DIAGNOSIS — N3000 Acute cystitis without hematuria: Secondary | ICD-10-CM

## 2024-01-16 DIAGNOSIS — E66813 Obesity, class 3: Secondary | ICD-10-CM

## 2024-01-16 LAB — COMPREHENSIVE METABOLIC PANEL WITH GFR
ALT: 13 U/L (ref 0–35)
AST: 18 U/L (ref 0–37)
Albumin: 4.2 g/dL (ref 3.5–5.2)
Alkaline Phosphatase: 110 U/L (ref 39–117)
BUN: 16 mg/dL (ref 6–23)
CO2: 30 meq/L (ref 19–32)
Calcium: 9.2 mg/dL (ref 8.4–10.5)
Chloride: 102 meq/L (ref 96–112)
Creatinine, Ser: 0.8 mg/dL (ref 0.40–1.20)
GFR: 90.27 mL/min (ref >60.00)
Glucose, Bld: 135 mg/dL — ABNORMAL HIGH (ref 70–99)
Potassium: 4 meq/L (ref 3.5–5.1)
Sodium: 140 meq/L (ref 135–145)
Total Bilirubin: 0.5 mg/dL (ref 0.2–1.2)
Total Protein: 6.6 g/dL (ref 6.0–8.3)

## 2024-01-16 LAB — VITAMIN B12: Vitamin B-12: 1053 pg/mL — ABNORMAL HIGH (ref 211–911)

## 2024-01-16 LAB — TSH: TSH: 0.66 u[IU]/mL (ref 0.35–5.50)

## 2024-01-16 LAB — VITAMIN D 25 HYDROXY (VIT D DEFICIENCY, FRACTURES): VITD: 38.99 ng/mL (ref 30.00–100.00)

## 2024-01-16 MED ORDER — CLONAZEPAM 0.5 MG PO TABS: 0.5000 mg | ORAL_TABLET | Freq: Two times a day (BID) | ORAL | 2 refills | Status: DC | PRN

## 2024-01-16 MED ORDER — OZEMPIC (0.25 OR 0.5 MG/DOSE) 2 MG/3ML ~~LOC~~ SOPN
0.5000 mg | PEN_INJECTOR | SUBCUTANEOUS | 1 refills | Status: DC
Start: 2024-01-16 — End: 2024-05-11

## 2024-01-16 NOTE — Progress Notes (Signed)
 Leron Glance, NP-C Phone: [phone]  Andrea [name] is a 43 y.o. female who presents today for hospital follow up.   Discussed the use of AI scribe software for clinical note transcription with the patient, who gave verbal consent to proceed.  History of Present Illness   Andrea [name] is a 43 year old female who presents for a hospital follow-up.  She has been experiencing a severe urinary tract infection (UTI) for which she initially visited urgent care. Initially prescribed Flagyl  and Diflucan , she was later switched to Augmentin  after culture results were obtained. She reports that her symptoms of burning, frequency, and urgency of urination have resolved. She reports that she has completed the full course of antibiotics and currently has no urinary symptoms.  She has been dealing with ankle issues and recently visited [facility] for evaluation. She anticipates a third surgery and will be discussing her x-rays with Dr. Sedalia.  Her blood sugar levels have been high, which she attributes to her recent illness. She has been on a 0.25 mg dose of Ozempic  for a while. Her A1c was recently checked and is at 7.0. She is also monitoring her blood pressure, which has been elevated at times, with the diastolic number reaching the 90s to 100s.  She has a psychiatric history and recently saw her psychiatrist, who is retiring. She will be transitioning to a new psychiatrist in a month. She is currently on Klonopin  and reports feeling much better with no recent episodes of concern. Her medication regimen includes Seroquel , Buspar , Klonopin , Cymbalta , trazodone , and zonisamide . She has previously received B12 injections, which she found beneficial, and takes over-the-counter vitamin D .      Social History   Tobacco Use  Smoking Status Never  Smokeless Tobacco Never    Current Outpatient Medications on File Prior to Visit  Medication Sig Dispense Refill   Albuterol -Budesonide  (AIRSUPRA) 90-80  MCG/ACT AERO      Atogepant  (QULIPTA ) 30 MG TABS Take 1 tablet daily 30 tablet 11   busPIRone  (BUSPAR ) 7.5 MG tablet Take 1 tablet (7.5 mg total) by mouth 2 (two) times daily. 60 tablet 0   cetirizine  (ZYRTEC ) 10 MG tablet Take 10 mg by mouth at bedtime.     DULoxetine  (CYMBALTA ) 60 MG capsule Take 2 capsules (120 mg total) by mouth daily. 180 capsule 0   eletriptan  (RELPAX ) 40 MG tablet Take 1 tablet at onset of migraine. May repeat in 2 hours if headache persists or recurs. Do not take more than 3 a week 10 tablet 5   famotidine  (PEPCID ) 40 MG tablet Take 1 tablet (40 mg total) by mouth 2 (two) times daily. 180 tablet 0   gabapentin  (NEURONTIN ) 600 MG tablet Take 1 tablet (600 mg total) by mouth 3 (three) times daily. 90 tablet 3   GEMTESA 75 MG TABS Take 1 tablet by mouth daily.     JARDIANCE  25 MG TABS tablet Take 1 tablet (25 mg total) by mouth every morning. 90 tablet 3   montelukast  (SINGULAIR ) 10 MG tablet TAKE 1 TABLET BY MOUTH EVERYDAY AT BEDTIME 90 tablet 0   ondansetron  (ZOFRAN -ODT) 4 MG disintegrating tablet Take 2 tablets (8 mg total) by mouth every 8 (eight) hours as needed for nausea or vomiting. 30 tablet 0   oxyCODONE -acetaminophen  (PERCOCET) 10-325 MG tablet Take 1 tablet by mouth every 4 (four) hours as needed for pain. 30 tablet 0   pantoprazole  (PROTONIX ) 40 MG tablet TAKE 1 TABLET BY MOUTH EVERY DAY IN THE  MORNING 90 tablet 1   QUEtiapine  (SEROQUEL ) 100 MG tablet Take 100 mg by mouth 2 (two) times daily. Take 100 mg by mouth in AM and 200 mg by mouth in PM     SYMBICORT 160-4.5 MCG/ACT inhaler Inhale 2 puffs into the lungs.     tiZANidine  (ZANAFLEX ) 4 MG tablet Take 4 mg by mouth every 8 (eight) hours as needed.     traZODone  (DESYREL ) 150 MG tablet Take 1 tablet (150 mg total) by mouth at bedtime. 90 tablet 1   zonisamide  (ZONEGRAN ) 100 MG capsule Take 5 capsules every night 150 capsule 11   No current facility-administered medications on file prior to visit.     ROS  see history of present illness  Objective  Physical Exam Vitals:   01/16/24 1102  BP: 128/76  Pulse: 73  Temp: 97.6 F (36.4 C)  SpO2: 94%    BP Readings from Last 3 Encounters:  01/19/24 134/88  01/16/24 128/76  01/12/24 (!) 139/92   Wt Readings from Last 3 Encounters:  01/19/24 285 lb (129.3 kg)  01/16/24 291 lb (132 kg)  01/12/24 292 lb (132.5 kg)    Physical Exam Constitutional:      General: She is not in acute distress.    Appearance: Normal appearance.  HENT:     Head: Normocephalic.  Cardiovascular:     Rate and Rhythm: Normal rate and regular rhythm.     Heart sounds: Normal heart sounds.  Pulmonary:     Effort: Pulmonary effort is normal.     Breath sounds: Normal breath sounds.  Skin:    General: Skin is warm and dry.  Neurological:     General: No focal deficit present.     Mental Status: She is alert.  Psychiatric:        Mood and Affect: Mood normal.        Behavior: Behavior normal.     Assessment/Plan: Please see individual problem list.  UTI symptoms Assessment & Plan: Resolved with antibiotics. Completed course. Denies urinary symptoms today. Return precautions given to patient. Urgent Care/ED labs and notes reviewed today.    Schizoaffective disorder, bipolar type [facility]) Assessment & Plan: Her condition is well-managed on the current medication regimen with no recent episodes. She is transitioning to a new psychiatrist as the current one is retiring. Refill Klonopin  prescription with a three-month supply and continue the current psychiatric medication regimen. PDMP reviewed.   Orders: -     clonazePAM ; Take 1 tablet (0.5 mg total) by mouth 2 (two) times daily as needed for anxiety.  Dispense: 60 tablet; Refill: 2  Generalized anxiety disorder with panic attacks -     clonazePAM ; Take 1 tablet (0.5 mg total) by mouth 2 (two) times daily as needed for anxiety.  Dispense: 60 tablet; Refill: 2 -     TSH  Type 2 diabetes mellitus with  peripheral neuropathy (HCC) Assessment & Plan: A1c is 7.0, acceptable for surgical clearance but ideally should be below 7.0. Blood sugars are elevated likely due to recent illness. Increase Ozempic  to 0.5 mg once weekly and monitor blood sugar levels. Encourage healthy diet and activity as tolerated.  Orders: -     Ozempic  (0.25 or 0.5 MG/DOSE); Inject 0.5 mg into the skin once a week.  Dispense: 3 mL; Refill: 1  Primary hypertension Assessment & Plan: Blood pressure is elevated at times, likely due to pain and illness. Not on any medications. Current reading is 128/76, which is satisfactory. Monitor  blood pressure regularly to ensure it remains below 130/90.   Orders: -     Comprehensive metabolic panel with GFR  Vitamin B12 deficiency -     Vitamin B12  Vitamin D  deficiency -     VITAMIN D  25 Hydroxy (Vit-D Deficiency, Fractures)     Return for TOC as scheduled.   Leron Glance, NP-C [facility]

## 2024-01-18 DIAGNOSIS — G894 Chronic pain syndrome: Secondary | ICD-10-CM | POA: Insufficient documentation

## 2024-01-18 DIAGNOSIS — Z79899 Other long term (current) drug therapy: Secondary | ICD-10-CM | POA: Insufficient documentation

## 2024-01-18 DIAGNOSIS — Z789 Other specified health status: Secondary | ICD-10-CM | POA: Insufficient documentation

## 2024-01-18 DIAGNOSIS — M899 Disorder of bone, unspecified: Secondary | ICD-10-CM | POA: Insufficient documentation

## 2024-01-18 NOTE — Progress Notes (Unsigned)
 PROVIDER NOTE: Interpretation of information contained herein should be left to medically-trained personnel. Specific patient instructions are provided elsewhere under Patient Instructions section of medical record. This document was created in part using AI and STT-dictation technology, any transcriptional errors that may result from this process are unintentional.  Patient: Andrea [name]  Service: E/M Encounter  Provider: Eric DELENA Como, MD  DOB: [birth year]  Delivery: Face-to-face  Specialty: Interventional Pain Management  MRN: [id number]  Setting: Ambulatory outpatient facility  Specialty designation: 09  Type: New Patient  Location: Outpatient office facility  PCP: Vincente Saber, NP  DOS: 01/19/2024    Referring Prov.: Janit Thresa HERO, DPM   Primary Reason(s) for Visit: Encounter for initial evaluation of one or more chronic problems (new to examiner) potentially causing chronic pain, and posing a threat to normal musculoskeletal function. (Level of risk: High) CC: No chief complaint on file.  HPI  Andrea [name] is a 43 y.o. year old, female patient, who comes for the first time to our practice referred by Janit Thresa HERO, DPM for our initial evaluation of her chronic pain. 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; Other insomnia; Long term current use of antipsychotic medication; Chronic pain of right ankle; Closed right ankle fracture; Bipolar 1 disorder (HCC); Type 2 diabetes mellitus with peripheral neuropathy (HCC); Mild persistent asthma; Pre-syncope; Postoperative surgical complication involving musculoskeletal system associated with musculoskeletal  procedure; Acquired varus deformity of right ankle; Syncope due to orthostatic hypotension; Hypotension; Occasional tremors; Constipation; Acute right-sided low back pain without sciatica; Acute right flank pain; Fluctuating blood pressure; Schizoaffective disorder, bipolar type (HCC); Closed trimalleolar fracture; Elevated blood pressure reading; Bipolar affective disorder, mixed, severe, with psychotic behavior (HCC); Delayed union of ankle fracture, right, closed; Fall at home, sequela; Neuropathic ulcer of right heel, limited to breakdown of skin (HCC); Panic disorder; Diabetic nephropathy associated with type 2 diabetes mellitus (HCC); Chronic pain syndrome; Pharmacologic therapy; Disorder of skeletal system; and Problems influencing health status on their problem list. Today she comes in for evaluation of her No chief complaint on file.  Pain Assessment: Location:     Radiating:   Onset:   Duration:   Quality:   Severity:  /10 (subjective, self-reported pain score)  Effect on ADL:   Timing:   Modifying factors:   BP:    HR:    Onset and Duration: {Hx; Onset and Duration:210120511} Cause of pain: {Hx; Cause:210120521} Severity: {Pain Severity:210120502} Timing: {Symptoms; Timing:210120501} Aggravating Factors: {Causes; Aggravating pain factors:210120507} Alleviating Factors: {Causes; Alleviating Factors:210120500} Associated Problems: {Hx; Associated problems:210120515} Quality of Pain: {Hx; Symptom quality or Descriptor:210120531} Previous Examinations or Tests: {Hx; Previous examinations or test:210120529} Previous Treatments: {Hx; Previous Treatment:210120503}  Andrea [name] is being evaluated for possible interventional pain management therapies for the treatment of her chronic pain.  Discussed the use of AI scribe software for clinical note transcription with the patient, who gave verbal consent to proceed.  History of Present Illness            Andrea [name] has been  informed that this initial visit was an evaluation only.  On the follow up appointment I will go over the results, including ordered tests and available interventional therapies. At that time she will have the opportunity to decide whether to proceed  with offered therapies or not. In the event that Andrea [name] prefers avoiding interventional options, this will conclude our involvement in the case.  Medication management recommendations may be provided upon request.  Patient informed that diagnostic tests may be ordered to assist in identifying underlying causes, narrow the list of differential diagnoses and aid in determining candidacy for (or contraindications to) planned therapeutic interventions.  Historic Controlled Substance Pharmacotherapy Review PMP and historical list of controlled substances: ***  Most recently prescribed controlled substance(s): Opioid Analgesic: *** MME/day: *** mg/day  Historical Monitoring: The patient  reports no history of drug use. List of prior UDS Testing: Lab Results  Component Value Date   MDMA NONE DETECTED 05/27/2023   COCAINSCRNUR NONE DETECTED 05/27/2023   COCAINSCRNUR NONE DETECTED 11/27/2021   COCAINSCRNUR NONE DETECTED 12/20/2016   PCPSCRNUR NONE DETECTED 05/27/2023   THCU NONE DETECTED 05/27/2023   THCU NONE DETECTED 11/27/2021   THCU NONE DETECTED 12/20/2016   ETH <5 12/20/2016   Historical Background Evaluation: [place] PMP: PDMP reviewed during this encounter. Review of the past 68-months conducted.             PMP NARX Score Report:  Narcotic: 530 Sedative: 451 Stimulant: 000 [place] Department of public safety, offender search: Engineer, mining Information) Non-contributory Risk Assessment Profile: Aberrant behavior: None observed or detected today Risk factors for fatal opioid overdose: None identified today PMP NARX Overdose Risk Score: 400 Fatal overdose hazard ratio (HR): Calculation deferred Non-fatal overdose hazard ratio (HR): Calculation  deferred Risk of opioid abuse or dependence: 0.7-3.0% with doses <= 36 MME/day and 6.1-26% with doses >= 120 MME/day. Substance use disorder (SUD) risk level: See below Personal History of Substance Abuse (SUD-Substance use disorder):  Alcohol :    Illegal Drugs:    Rx Drugs:    ORT Risk Level calculation:    ORT Scoring interpretation table:  Score <3 = Low Risk for SUD  Score between 4-7 = Moderate Risk for SUD  Score >8 = High Risk for Opioid Abuse   PHQ-2 Depression Scale:  Total score:    PHQ-2 Scoring interpretation table: (Score and probability of major depressive disorder)  Score 0 = No depression  Score 1 = 15.4% Probability  Score 2 = 21.1% Probability  Score 3 = 38.4% Probability  Score 4 = 45.5% Probability  Score 5 = 56.4% Probability  Score 6 = 78.6% Probability   PHQ-9 Depression Scale:  Total score:    PHQ-9 Scoring interpretation table:  Score 0-4 = No depression  Score 5-9 = Mild depression  Score 10-14 = Moderate depression  Score 15-19 = Moderately severe depression  Score 20-27 = Severe depression (2.4 times higher risk of SUD and 2.89 times higher risk of overuse)   Pharmacologic Plan: As per protocol, I have not taken over any controlled substance management, pending the results of ordered tests and/or consults.            Initial impression: Pending review of available data and ordered tests.  Meds   Current Outpatient Medications:    Albuterol -Budesonide  (AIRSUPRA) 90-80 MCG/ACT AERO, , Disp: , Rfl:    Atogepant  (QULIPTA ) 30 MG TABS, Take 1 tablet daily, Disp: 30 tablet, Rfl: 11   busPIRone  (BUSPAR ) 7.5 MG tablet, Take 1 tablet (7.5 mg total) by mouth 2 (two) times daily., Disp: 60 tablet, Rfl: 0   cetirizine  (ZYRTEC ) 10 MG tablet, Take 10 mg by mouth at bedtime., Disp: , Rfl:    clonazePAM  (KLONOPIN ) 0.5 MG tablet, Take 1  tablet (0.5 mg total) by mouth 2 (two) times daily as needed for anxiety., Disp: 60 tablet, Rfl: 2   DULoxetine  (CYMBALTA ) 60  MG capsule, Take 2 capsules (120 mg total) by mouth daily., Disp: 180 capsule, Rfl: 0   eletriptan  (RELPAX ) 40 MG tablet, Take 1 tablet at onset of migraine. May repeat in 2 hours if headache persists or recurs. Do not take more than 3 a week, Disp: 10 tablet, Rfl: 5   famotidine  (PEPCID ) 40 MG tablet, Take 1 tablet (40 mg total) by mouth 2 (two) times daily., Disp: 180 tablet, Rfl: 0   gabapentin  (NEURONTIN ) 600 MG tablet, Take 1 tablet (600 mg total) by mouth 3 (three) times daily., Disp: 90 tablet, Rfl: 3   GEMTESA 75 MG TABS, Take 1 tablet by mouth daily., Disp: , Rfl:    JARDIANCE  25 MG TABS tablet, Take 1 tablet (25 mg total) by mouth every morning., Disp: 90 tablet, Rfl: 3   montelukast  (SINGULAIR ) 10 MG tablet, TAKE 1 TABLET BY MOUTH EVERYDAY AT BEDTIME, Disp: 90 tablet, Rfl: 0   ondansetron  (ZOFRAN -ODT) 4 MG disintegrating tablet, Take 2 tablets (8 mg total) by mouth every 8 (eight) hours as needed for nausea or vomiting., Disp: 30 tablet, Rfl: 0   oxyCODONE -acetaminophen  (PERCOCET) 10-325 MG tablet, Take 1 tablet by mouth every 4 (four) hours as needed for pain., Disp: 30 tablet, Rfl: 0   pantoprazole  (PROTONIX ) 40 MG tablet, TAKE 1 TABLET BY MOUTH EVERY DAY IN THE MORNING, Disp: 90 tablet, Rfl: 1   potassium chloride  (MICRO-K ) 10 MEQ CR capsule, Take 10 mEq by mouth daily., Disp: , Rfl:    QUEtiapine  (SEROQUEL ) 100 MG tablet, Take 100 mg by mouth 2 (two) times daily. Take 100 mg by mouth in AM and 200 mg by mouth in PM, Disp: , Rfl:    Semaglutide ,0.25 or 0.5MG /DOS, (OZEMPIC , 0.25 OR 0.5 MG/DOSE,) 2 MG/3ML SOPN, Inject 0.5 mg into the skin once a week., Disp: 3 mL, Rfl: 1   SYMBICORT 160-4.5 MCG/ACT inhaler, Inhale 2 puffs into the lungs., Disp: , Rfl:    tiZANidine  (ZANAFLEX ) 4 MG tablet, Take 4 mg by mouth every 8 (eight) hours as needed., Disp: , Rfl:    traZODone  (DESYREL ) 150 MG tablet, Take 1 tablet (150 mg total) by mouth at bedtime., Disp: 90 tablet, Rfl: 1   zonisamide  (ZONEGRAN )  100 MG capsule, Take 5 capsules every night, Disp: 150 capsule, Rfl: 11  Imaging Review  Lumbosacral Imaging: Lumbar DG (Complete) 4+V: Results for orders placed during the hospital encounter of 08/26/11 DG Lumbar Spine Complete  Narrative *RADIOLOGY REPORT*  Clinical Data: Low back pain for 2 months  LUMBAR SPINE - COMPLETE 4+ VIEW  Comparison: 11/26/2010  Findings: There is a mild curvature of the lumbar spine which is convex to the left.  The vertebral body heights are well preserved disc spaces appear normal.  No fracture or subluxation identified.  IMPRESSION:  1.  No acute findings. 2.  Mild scoliosis.  Original Report Authenticated By: WADDELL HILARIO CALK, M.D.  Knee Imaging: Knee-L DG 1-2 views: Results for orders placed in visit on 12/20/22 DG Knee 1-2 Views Left  Narrative X-rays left knee were obtained in clinic today.  There is a nondisplaced fracture of the medial femoral condyle at the insertion of the MCL.  There has been no change in appearance of the avulsion fragment.  No interval displacement.  Joint space is preserved.  Overall alignment remains unchanged.  Impression: Stable left  medial femoral condyle avulsion fracture  Knee-R DG 4 views: Results for orders placed during the hospital encounter of 10/04/22 DG Knee Complete 4 Views Right  Narrative CLINICAL DATA:  eval for injury.  Fall  EXAM: RIGHT KNEE - COMPLETE 4+ VIEW  COMPARISON:  None Available.  FINDINGS: No evidence of fracture, dislocation, or joint effusion. No evidence of arthropathy or other focal bone abnormality. Mild subcutaneus soft tissue edema.  IMPRESSION: No acute displaced fracture or dislocation.   Electronically Signed By: Morgane  Naveau M.D. On: 10/04/2022 01:56  Knee-L DG 4 views: Results for orders placed during the hospital encounter of 10/04/22 DG Knee Complete 4 Views Left  Narrative CLINICAL DATA:  eval for injury  EXAM: LEFT KNEE - COMPLETE 4+  VIEW  COMPARISON:  None Available.  FINDINGS: Vague vertical lucency along the medial posterior humeral condyle with no definite acute fracture. Otherwise no evidence of fracture, dislocation, or joint effusion. No evidence of arthropathy or other focal bone abnormality. Mild subcutaneus soft tissue edema.  IMPRESSION: 1. Vague vertical lucency along the medial humeral condyle with no definite acute fracture. Correlate with point tenderness to palpation. 2. Otherwise no acute displaced fracture or dislocation.   Electronically Signed By: Morgane  Naveau M.D. On: 10/04/2022 01:58  Ankle Imaging: Ankle-R DG Complete: Results for orders placed in visit on 10/31/23 DG Ankle Complete Right  Narrative Please see detailed radiograph report in office note.  Ankle-L DG Complete: Results for orders placed in visit on 10/22/22 DG Ankle Complete Left  Narrative X-rays of the left ankle were obtained in clinic today.  There is a single screw in the medial malleolus.  This remains in stable position.  Mortise is congruent.  No syndesmotic disruption.  On the lateral projection, there is a small avulsion fracture of the superior aspect of the navicular.  This has not changed in overall alignment.  There has not been obvious callus formation.  Impression: Stable avulsion fracture of the superior aspect of the navicular  Foot Imaging: Foot-R DG Complete: Results for orders placed in visit on 08/07/23 DG Foot Complete Right  Narrative Please see detailed radiograph report in office note.  Foot-L DG Complete: Results for orders placed in visit on 12/20/22 DG Foot Complete Left  Narrative X-rays left foot were obtained in clinic today.  No acute injuries noted.  There is a small avulsion fracture of the dorsal aspect of the navicular.  This remains in unchanged position.  No bony lesions.  No additional injuries.  Impression: Stable left navicular avulsion fracture  Complexity Note:  Imaging results reviewed.                         ROS  Cardiovascular: {Hx; Cardiovascular History:210120525} Pulmonary or Respiratory: {Hx; Pumonary and/or Respiratory History:210120523} Neurological: {Hx; Neurological:210120504} Psychological-Psychiatric: {Hx; Psychological-Psychiatric History:210120512} Gastrointestinal: {Hx; Gastrointestinal:210120527} Genitourinary: {Hx; Genitourinary:210120506} Hematological: {Hx; Hematological:210120510} Endocrine: {Hx; Endocrine history:210120509} Rheumatologic: {Hx; Rheumatological:210120530} Musculoskeletal: {Hx; Musculoskeletal:210120528} Work History: {Hx; Work history:210120514}  Allergies  Ms. Garguilo is allergic to bactrim [sulfamethoxazole -trimethoprim] and codeine.  Laboratory Chemistry Profile   Renal Lab Results  Component Value Date   BUN 16 01/16/2024   CREATININE 0.80 01/16/2024   BCR 20 12/31/2016   GFR 90.27 01/16/2024   GFRAA >60 01/01/2020   GFRNONAA >60 01/06/2024   SPECGRAV 1.015 01/06/2024   PHUR 6.0 01/06/2024   PROTEINUR trace (A) 01/06/2024     Electrolytes Lab Results  Component Value Date   NA 140 01/16/2024  K 4.0 01/16/2024   CL 102 01/16/2024   CALCIUM 9.2 01/16/2024   MG 2.2 10/04/2022   PHOS 4.5 11/26/2021     Hepatic Lab Results  Component Value Date   AST 18 01/16/2024   ALT 13 01/16/2024   ALBUMIN 4.2 01/16/2024   ALKPHOS 110 01/16/2024   AMYLASE 54 08/18/2007   LIPASE 57 (H) 03/30/2023     ID Lab Results  Component Value Date   HIV Non Reactive 05/04/2023   SARSCOV2NAA NEGATIVE 03/30/2023   STAPHAUREUS NEGATIVE 05/13/2023   MRSAPCR NEGATIVE 05/13/2023   PREGTESTUR NEGATIVE 01/01/2020     Bone Lab Results  Component Value Date   VD25OH 38.99 01/16/2024     Endocrine Lab Results  Component Value Date   GLUCOSE 135 (H) 01/16/2024   GLUCOSEU >=500 (A) 12/31/2023   HGBA1C 7.1 (H) 08/05/2023   TSH 0.66 01/16/2024     Neuropathy Lab Results  Component Value Date    VITAMINB12 1,053 (H) 01/16/2024   FOLATE 8.8 06/26/2023   HGBA1C 7.1 (H) 08/05/2023   HIV Non Reactive 05/04/2023     CNS No results found for: COLORCSF, APPEARCSF, RBCCOUNTCSF, WBCCSF, POLYSCSF, LYMPHSCSF, EOSCSF, PROTEINCSF, GLUCCSF, JCVIRUS, CSFOLI, IGGCSF, LABACHR, ACETBL   Inflammation (CRP: Acute  ESR: Chronic) Lab Results  Component Value Date   LATICACIDVEN 1.6 05/27/2023     Rheumatology No results found for: RF, ANA, LABURIC, URICUR, LYMEIGGIGMAB, LYMEABIGMQN, HLAB27   Coagulation Lab Results  Component Value Date   INR 1.1 11/26/2021   LABPROT 14.4 11/26/2021   PLT 211 01/06/2024   DDIMER <0.27 07/19/2023     Cardiovascular Lab Results  Component Value Date   CKTOTAL 79 07/31/2021   HGB 12.0 01/06/2024   HCT 39.2 01/06/2024     Screening Lab Results  Component Value Date   SARSCOV2NAA NEGATIVE 03/30/2023   STAPHAUREUS NEGATIVE 05/13/2023   MRSAPCR NEGATIVE 05/13/2023   HIV Non Reactive 05/04/2023   PREGTESTUR NEGATIVE 01/01/2020     Cancer No results found for: CEA, CA125, LABCA2   Allergens No results found for: ALMOND, APPLE, ASPARAGUS, AVOCADO, BANANA, BARLEY, BASIL, BAYLEAF, GREENBEAN, LIMABEAN, WHITEBEAN, BEEFIGE, REDBEET, BLUEBERRY, BROCCOLI, CABBAGE, MELON, CARROT, CASEIN, CASHEWNUT, CAULIFLOWER, CELERY     Note: Lab results reviewed.  PFSH  Drug: Ms. Rask  reports no history of drug use. Alcohol :  reports no history of alcohol  use. Tobacco:  reports that she has never smoked. She has never used smokeless tobacco. Medical:  has a past medical history of Allergy, Anemia, Anxiety, Arthritis, Asthma, Bipolar 1 disorder (HCC), Bipolar 1 disorder, mixed, severe (HCC) (12/20/2016), Bipolar I disorder, most recent episode depressed (HCC) (12/20/2016), Complication of anesthesia, Depression, Diabetes mellitus without complication (HCC), Drug induced akathisia  (04/02/2022), GERD (gastroesophageal reflux disease), Hypertension, Kidney stones, Long term current use of antipsychotic medication (03/13/2022), Low back pain, Migraines, Neuromuscular disorder (HCC), PCOS (polycystic ovarian syndrome), PONV (postoperative nausea and vomiting), Schizophrenia (HCC), Seizure (HCC) (10/03/2021), Seizures (HCC) (06/04/2016), and Shortness of breath dyspnea. Family: family history includes Depression in her maternal aunt and maternal aunt; Diabetes in her maternal aunt; Healthy in her brother and sister; Heart disease in her mother; Hypertension in her father and mother; Miscarriages / [place] in her mother.  Past Surgical History:  Procedure Laterality Date   ABDOMINAL HYSTERECTOMY N/A 11/14/2015   Procedure: HYSTERECTOMY ABDOMINAL;  Surgeon: Oneil FORBES Piety, MD;  Location: [facility];  Service: Gynecology;  Laterality: N/A;   ANKLE FUSION Right 05/13/2023   Procedure: ARTHRODESIS ANKLE;  Surgeon:  Standiford, Marsa FALCON, DPM;  Location: [facility];  Service: Orthopedics/Podiatry;  Laterality: Right;  Hardware removal, right ankle tibiot talo calcaneal arthrodesis   CHOLECYSTECTOMY     FRACTURE SURGERY     INTRAUTERINE DEVICE (IUD) INSERTION  06/14/2014   [facility]   LYMPH NODES REMOVED     ORIF ANKLE FRACTURE Right 05/03/2023   Procedure: OPEN REDUCTION INTERNAL FIXATION (ORIF) ANKLE FRACTURE;  Surgeon: Silva Juliene SAUNDERS, DPM;  Location: [facility];  Service: Orthopedics/Podiatry;  Laterality: Right;   ovarian cyst removed     SALPINGOOPHORECTOMY Bilateral 11/14/2015   Procedure: SALPINGO OOPHORECTOMY;  Surgeon: Oneil FORBES Piety, MD;  Location: [facility];  Service: Gynecology;  Laterality: Bilateral;   Active Ambulatory Problems    Diagnosis Date Noted   Kidney stones    Migraines 10/30/2012   Obesity, Class III, BMI 40-49.9 (morbid obesity) (HCC) 10/30/2012   Acute pyelonephritis 01/16/2013   HTN (hypertension) 09/18/2016   Convulsion (HCC) 11/26/2021    PTSD (post-traumatic stress disorder) 03/13/2022   Generalized anxiety disorder with panic attacks 03/13/2022   Major depressive disorder, 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 03/13/2022   Other insomnia 03/13/2022   Long term current use of antipsychotic medication 03/13/2022   Chronic pain of right ankle 07/16/2022   Closed right ankle fracture 05/03/2023   Bipolar 1 disorder (HCC) 05/03/2023   Type 2 diabetes mellitus with peripheral neuropathy (HCC) 05/03/2023   Mild persistent asthma 05/03/2023   Pre-syncope 05/03/2023   Postoperative surgical complication involving musculoskeletal system associated with musculoskeletal procedure 05/12/2023   Acquired varus deformity of right ankle 05/13/2023   Syncope due to orthostatic hypotension 06/20/2023   Hypotension 07/02/2023   Occasional tremors 07/02/2023   Constipation 07/02/2023   Acute right-sided low back pain without sciatica 07/18/2023   Acute right flank pain 07/18/2023   Fluctuating blood pressure 07/18/2023   Schizoaffective disorder, bipolar type (HCC) 12/05/2023   Closed trimalleolar fracture 05/20/2023   Elevated blood pressure reading 12/26/2023   Bipolar affective disorder, mixed, severe, with psychotic behavior (HCC) 05/20/2023   Delayed union of ankle fracture, right, closed 01/14/2024   Fall at home, sequela 01/14/2024   Neuropathic ulcer of right heel, limited to breakdown of skin (HCC) 01/14/2024   Panic disorder 05/20/2023   Diabetic nephropathy associated with type 2 diabetes mellitus (HCC) 07/23/2023   Chronic pain syndrome 01/18/2024   Pharmacologic therapy 01/18/2024   Disorder of skeletal system 01/18/2024   Problems influencing health status 01/18/2024   Resolved Ambulatory Problems    Diagnosis Date Noted   Maxillary sinusitis, acute 09/12/2012   Acute pyelonephritis 09/12/2012   Pelvic pain in female 11/14/2015    Seizures (HCC) 06/04/2016   Uncontrolled type 2 diabetes mellitus with hyperglycemia, with long-term current use of insulin  (HCC) 09/18/2016   Bipolar I disorder, most recent episode depressed (HCC) 12/20/2016   Bipolar 1 disorder, mixed, severe (HCC) 12/20/2016   Mild persistent asthma with acute exacerbation 06/18/2017   Irregular periods 09/26/2020   Seizure (HCC) 10/03/2021   Polypharmacy 03/13/2022   Drug induced akathisia 04/02/2022   Essential hypertension 05/03/2023   Closed trimalleolar fracture of right ankle 05/03/2023   Obesity (BMI 30-39.9) 05/14/2023   Diabetes mellitus treated with oral medication (HCC) 07/02/2023   Past Medical History:  Diagnosis Date   Allergy    Anemia    Anxiety    Arthritis    Asthma    Complication of anesthesia  Depression    Diabetes mellitus without complication (HCC)    GERD (gastroesophageal reflux disease)    Hypertension    Low back pain    Neuromuscular disorder (HCC)    PCOS (polycystic ovarian syndrome)    PONV (postoperative nausea and vomiting)    Schizophrenia (HCC)    Shortness of breath dyspnea    Constitutional Exam  General appearance: Well nourished, well developed, and well hydrated. In no apparent acute distress There were no vitals filed for this visit. BMI Assessment: Estimated body mass index is 40.59 kg/m as calculated from the following:   Height as of 01/16/24: 5' 11 (1.803 m).   Weight as of 01/16/24: 291 lb (132 kg).  BMI interpretation table: BMI level Category Range association with higher incidence of chronic pain  <18 kg/m2 Underweight   18.5-24.9 kg/m2 Ideal body weight   25-29.9 kg/m2 Overweight Increased incidence by 20%  30-34.9 kg/m2 Obese (Class I) Increased incidence by 68%  35-39.9 kg/m2 Severe obesity (Class II) Increased incidence by 136%  >40 kg/m2 Extreme obesity (Class III) Increased incidence by 254%   Patient's current BMI Ideal Body weight  There is no height or weight on file  to calculate BMI. Ideal body weight: 70.8 kg (156 lb 1.4 oz) Adjusted ideal body weight: 95.3 kg (210 lb 0.8 oz)   BMI Readings from Last 4 Encounters:  01/16/24 40.59 kg/m  01/12/24 40.73 kg/m  01/06/24 40.89 kg/m  12/26/23 40.53 kg/m   Wt Readings from Last 4 Encounters:  01/16/24 291 lb (132 kg)  01/12/24 292 lb (132.5 kg)  01/06/24 293 lb 3.4 oz (133 kg)  12/26/23 290 lb 9.6 oz (131.8 kg)    Psych/Mental status: Alert, oriented x 3 (person, place, & time)       Eyes: PERLA Respiratory: No evidence of acute respiratory distress  Assessment  Primary Diagnosis & Pertinent Problem List: The primary encounter diagnosis was Chronic pain syndrome. Diagnoses of Pharmacologic therapy, Disorder of skeletal system, and Problems influencing health status were also pertinent to this visit.  Visit Diagnosis (New problems to examiner): 1. Chronic pain syndrome   2. Pharmacologic therapy   3. Disorder of skeletal system   4. Problems influencing health status    Plan of Care (Initial workup plan)  Note: Ms. Urda was reminded that as per protocol, today's visit has been an evaluation only. We have not taken over the patient's controlled substance management.  Problem-specific plan: Assessment and Plan            Lab Orders  No laboratory test(s) ordered today   Imaging Orders  No imaging studies ordered today   Referral Orders  No referral(s) requested today   Procedure Orders    No procedure(s) ordered today   Pharmacotherapy (current): Medications ordered:  No orders of the defined types were placed in this encounter.  Medications administered during this visit: Gaynelle C. Rettinger had no medications administered during this visit.   Analgesic Pharmacotherapy:  Opioid Analgesics: For patients currently taking or requesting to take opioid analgesics, in accordance with [place]  Medical Board Guidelines, we will assess their risks and indications for the  use of these substances. After completing our evaluation, we may offer recommendations, but we no longer take patients for medication management. The prescribing physician will ultimately decide, based on his/her training and level of comfort whether to adopt any of the recommendations, including whether or not to prescribe such medicines.  Membrane stabilizer: To be determined at a  later time  Muscle relaxant: To be determined at a later time  NSAID: To be determined at a later time  Other analgesic(s): To be determined at a later time   Interventional management options: Ms. Gaspar was informed that there is no guarantee that she would be a candidate for interventional therapies. The decision will be based on the results of diagnostic studies, as well as Ms. Levert's risk profile.  Procedure(s) under consideration:  Pending results of ordered studies     Interventional Therapies  Risk Factors  Considerations  Medical Comorbidities:     Planned  Pending:      Under consideration:   Pending   Completed: (Analgesic benefit)1  None at this time   Therapeutic  Palliative (PRN) options:   None established   Completed by other providers:   None reported  1(Analgesic benefit): Expressed in percentage (%). (Local anesthetic[LA] +/- sedation  L.A.Local Anesthetic  Steroid benefit  Ongoing benefit)   Provider-requested follow-up: No follow-ups on file.  Future Appointments  Date Time Provider Department Center  01/19/2024 10:00 AM Tanya Glisson, MD [facility] None  01/27/2024 10:00 AM [facility]  01/27/2024 10:20 AM [facility]  03/02/2024  1:40 PM Gretel App, NP [facility]  05/05/2024 11:30 AM Georjean Darice HERO, MD [facility] None   I discussed the assessment and treatment plan with the patient. The patient was provided an opportunity to ask questions and all were answered. The patient agreed with the plan and demonstrated an understanding  of the instructions.  Patient advised to call back or seek an in-person evaluation if the symptoms or condition worsens.  Duration of encounter: *** minutes.  Total time on encounter, as per AMA guidelines included both the face-to-face and non-face-to-face time personally spent by the physician and/or other qualified health care professional(s) on the day of the encounter (includes time in activities that require the physician or other qualified health care professional and does not include time in activities normally performed by clinical staff). Physician's time may include the following activities when performed: Preparing to see the patient (e.g., pre-charting review of records, searching for previously ordered imaging, lab work, and nerve conduction tests) Review of prior analgesic pharmacotherapies. Reviewing PMP Interpreting ordered tests (e.g., lab work, imaging, nerve conduction tests) Performing post-procedure evaluations, including interpretation of diagnostic procedures Obtaining and/or reviewing separately obtained history Performing a medically appropriate examination and/or evaluation Counseling and educating the patient/family/caregiver Ordering medications, tests, or procedures Referring and communicating with other health care professionals (when not separately reported) Documenting clinical information in the electronic or other health record Independently interpreting results (not separately reported) and communicating results to the patient/ family/caregiver Care coordination (not separately reported)  Note by: Glisson DELENA Tanya, MD (TTS and AI technology used. I apologize for any typographical errors that were not detected and corrected.) Date: 01/19/2024; Time: 9:27 PM

## 2024-01-19 ENCOUNTER — Ambulatory Visit: Payer: MEDICAID | Attending: Pain Medicine | Admitting: Pain Medicine

## 2024-01-19 ENCOUNTER — Encounter: Payer: Self-pay | Admitting: Pain Medicine

## 2024-01-19 VITALS — BP 134/88 | HR 63 | Temp 97.8°F | Ht 71.0 in | Wt 285.0 lb

## 2024-01-19 DIAGNOSIS — E1121 Type 2 diabetes mellitus with diabetic nephropathy: Secondary | ICD-10-CM | POA: Insufficient documentation

## 2024-01-19 DIAGNOSIS — M25571 Pain in right ankle and joints of right foot: Secondary | ICD-10-CM | POA: Insufficient documentation

## 2024-01-19 DIAGNOSIS — S82851S Displaced trimalleolar fracture of right lower leg, sequela: Secondary | ICD-10-CM | POA: Diagnosis present

## 2024-01-19 DIAGNOSIS — Z79899 Other long term (current) drug therapy: Secondary | ICD-10-CM | POA: Diagnosis present

## 2024-01-19 DIAGNOSIS — G8929 Other chronic pain: Secondary | ICD-10-CM | POA: Diagnosis present

## 2024-01-19 DIAGNOSIS — Z79891 Long term (current) use of opiate analgesic: Secondary | ICD-10-CM | POA: Insufficient documentation

## 2024-01-19 DIAGNOSIS — G8928 Other chronic postprocedural pain: Secondary | ICD-10-CM | POA: Diagnosis present

## 2024-01-19 DIAGNOSIS — G894 Chronic pain syndrome: Secondary | ICD-10-CM | POA: Insufficient documentation

## 2024-01-19 DIAGNOSIS — Z7985 Long-term (current) use of injectable non-insulin antidiabetic drugs: Secondary | ICD-10-CM

## 2024-01-19 DIAGNOSIS — Z789 Other specified health status: Secondary | ICD-10-CM | POA: Diagnosis present

## 2024-01-19 DIAGNOSIS — S82891S Other fracture of right lower leg, sequela: Secondary | ICD-10-CM | POA: Insufficient documentation

## 2024-01-19 DIAGNOSIS — M899 Disorder of bone, unspecified: Secondary | ICD-10-CM | POA: Insufficient documentation

## 2024-01-19 DIAGNOSIS — E1142 Type 2 diabetes mellitus with diabetic polyneuropathy: Secondary | ICD-10-CM | POA: Insufficient documentation

## 2024-01-19 MED ORDER — NALOXONE HCL 4 MG/0.1ML NA LIQD: 1.0000 | NASAL | 1 refills | Status: AC | PRN

## 2024-01-19 NOTE — Patient Instructions (Signed)
 ______________________________________________________________________    New Patients  Welcome to [facility].   Initial Visit The first or initial visit consists of an evaluation only.   Interventional pain management.  This is an interventional pain management medical practice. This means that we offer therapies other than prescription controlled substances to manage chronic pain. These therapies may include, but are not limited to, diagnostic, therapeutic, and palliative specialized injection therapies (i.e.: Epidural Steroids, Facet Blocks, etc.). We specialize in a variety of nerve blocks as well as radiofrequency treatments. We offer pain implant evaluations and trials, as well as follow up management. In addition we also provide a variety joint injections, including Viscosupplementation (AKA: Gel Therapy).  Prescription Pain Medication. We specialize in alternatives to opioids.  Although we can provide evaluations and recommendations for/of pharmacologic therapies based on CDC Guidelines, we no longer take patients for long-term medication management. Please be clear that we will not be taking over the prescribing of your pain medications.  ______________________________________________________________________      ______________________________________________________________________    Patient Information update  To: All of our patients.  Re: Name change.  It has been made official that our current name, "[facility]"   will soon be changed to "[facility]".   The purpose of this change is to eliminate any confusion created by the concept of our practice being a "Medication Management Pain Clinic". In the past this has led to the misconception that we treat pain primarily by the use of prescription  medications.  Nothing can be farther from the truth.   Understanding PAIN MANAGEMENT: To further understand what our practice does, you first have to understand that "Pain Management" is a subspecialty that requires additional training once a physician has completed their specialty training, which can be in either Anesthesia, Neurology, Psychiatry, or Physical Medicine and Rehabilitation (PMR). Each one of these contributes to the final approach taken by each physician to the management of their patient's pain. To be a "Pain Management Specialist" you must have first completed one of the specialty trainings below.  Anesthesiologists - trained in clinical pharmacology and interventional techniques such as nerve blockade and regional as well as central neuroanatomy. They are trained to block pain before, during, and after surgical interventions.  Neurologists - trained in the diagnosis and pharmacological treatment of complex neurological conditions, such as Multiple Sclerosis, Parkinson's, spinal cord injuries, and other systemic conditions that may be associated with symptoms that may include but are not limited to pain. They tend to rely primarily on the treatment of chronic pain using prescription medications.  Psychiatrist - trained in conditions affecting the psychosocial wellbeing of patients including but not limited to depression, anxiety, schizophrenia, personality disorders, addiction, and other substance use disorders that may be associated with chronic pain. They tend to rely primarily on the treatment of chronic pain using prescription medications.   Physical Medicine and Rehabilitation (PMR) physicians, also known as physiatrists - trained to treat a wide variety of medical conditions affecting the brain, spinal cord, nerves, bones, joints, ligaments, muscles, and tendons. Their training is primarily aimed at treating patients that have suffered injuries that have caused severe physical  impairment. Their training is primarily aimed at the physical therapy and rehabilitation of those patients. They may also work alongside orthopedic surgeons or neurosurgeons using their expertise in assisting surgical patients to recover after their surgeries.  INTERVENTIONAL PAIN MANAGEMENT is  sub-subspecialty of Pain Management.  Our physicians are Board-certified in Anesthesia, Pain Management, and Interventional Pain Management.  This meaning that not only have they been trained and Board-certified in their specialty of Anesthesia, and subspecialty of Pain Management, but they have also received further training in the sub-subspecialty of Interventional Pain Management, in order to become Board-certified as INTERVENTIONAL PAIN MANAGEMENT SPECIALIST.    Mission: Our goal is to use our skills in  INTERVENTIONAL PAIN MANAGEMENT as alternatives to the chronic use of prescription opioid medications for the treatment of pain. To make this more clear, we have changed our name to reflect what we do and offer. We will continue to offer medication management assessment and recommendations, but we will not be taking over any patient's medication management.  ______________________________________________________________________       ______________________________________________________________________    Appointment Information  It is our goal and responsibility to provide the medical community with assistance in the evaluation and management of patients with chronic pain. Unfortunately our resources are limited. Because we do not have an unlimited amount of time, or available appointments, we are required to closely monitor for unkept or cancelled appointments.  Patient's responsibilities: 1. Punctuality: Patients are required to be physically present in our office at least 15 minutes before their scheduled appointment. 2. Tardiness: Patients not physically present in our office at their scheduled  appointment time will be rescheduled. 3. Plan ahead: Assume that you will encounter traffic and plan to arrive 30 minutes before your appointment. 4. Other appointments and responsibilities: Do not schedule other appointments immediately before or after your scheduled appointment.  5. Be prepared: Make a list of everything that you need to discuss with your provider so that you use your time efficiently. Once the provider leaves your room, he/she will not return to your room to discuss anything that you neglected to bring up during your allowed time. 6. No children or pets: Do not bring children or pets to your appointment. 7. Cancelling or rescheduling your appointment: Advanced notification (more than 24 hours in advance) is required. 8. No Show: Not calling to cancel an appointment and simply not showing up is unacceptable. This leads to loss of appointments that could have been used by a patient in need. (See below)  Corrective process for repeat offenders:  No Shows: Three (3) No Shows within a 12 month period will result in an automatic discharge from our practice. Rescheduling or cancelling with more than 24 hours notice will not be penalized and will not count against you. Tardiness: If you have to be rescheduled three (3) times due to late arrivals, it will be counted as one (1) No Show. Cancellation or reschedule: Three (3) cancellations or rescheduling where notice was given with less than 24 hours in advance, will be recorded as one (1) No Show.  Types of appointments: New patient initial evaluation: These are evaluations only. Your initial patient questionnaire will be collected and entered into the system. A history of present illness will be taken. Prior lab work, imaging studies, and associated treatments will be reviewed. The provider may order appropriate diagnostic testing depending on their evaluation and review of available information. No treatments will be started on  this visit. 2nd Follow-up visit: During this visit your provider will inform you of the results of the diagnostic tests ordered on the initial evaluation. Based on the providers assessment, treatment options will be offered, at which the patient will decide if he/she is interested in the alternatives. If  interested, a treatment plan will be established and started. Procedure visits: Post-procedure evaluation visits: Evaluation visits MM New problems Flare-up evaluations Follow-up after diagnostic testing ______________________________________________________________________      ______________________________________________________________________     Naloxone  Nasal Spray  Why am I receiving this medication? [place]  STOP ACT requires that all patients taking high dose opioids or at risk of opioids respiratory depression, be prescribed an opioid reversal agent, such as Naloxone  (AKA: Narcan ).  What is this medication? NALOXONE  (nal OX one) treats opioid overdose, which causes slow or shallow breathing, severe drowsiness, or trouble staying awake. Call emergency services after using this medication. You may need additional treatment. Naloxone  works by reversing the effects of opioids. It belongs to a group of medications called opioid blockers.  COMMON BRAND NAME(S): Kloxxado , Narcan   What should I tell my care team before I take this medication? They need to know if you have any of these conditions: Heart disease Substance use disorder An unusual or allergic reaction to naloxone , other medications, foods, dyes, or preservatives Pregnant or trying to get pregnant Breast-feeding  When to use this medication? This medication is to be used for the treatment of respiratory depression (less than 8 breaths per minute) secondary to opioid overdose.   How to use this medication? This medication is for use in the nose. Lay the person on their back. Support their neck with your hand  and allow the head to tilt back before giving the medication. The nasal spray should be given into 1 nostril. After giving the medication, move the person onto their side. Do not remove or test the nasal spray until ready to use. Get emergency medical help right away after giving the first dose of this medication, even if the person wakes up. You should be familiar with how to recognize the signs and symptoms of a narcotic overdose. If more doses are needed, give the additional dose in the other nostril. Talk to your care team about the use of this medication in children. While this medication may be prescribed for children as young as newborns for selected conditions, precautions do apply.  Naloxone  Overdosage: If you think you have taken too much of this medicine contact a poison control center or emergency room at once.  NOTE: This medicine is only for you. Do not share this medicine with others.  What if I miss a dose? This does not apply.  What may interact with this medication? This is only used during an emergency. No interactions are expected during emergency use. This list may not describe all possible interactions. Give your health care provider a list of all the medicines, herbs, non-prescription drugs, or dietary supplements you use. Also tell them if you smoke, drink alcohol , or use illegal drugs. Some items may interact with your medicine.  What should I watch for while using this medication? Keep this medication ready for use in the case of an opioid overdose. Make sure that you have the phone number of your care team and local hospital ready. You may need to have additional doses of this medication. Each nasal spray contains a single dose. Some emergencies may require additional doses. After use, bring the treated person to the nearest hospital or call 911. Make sure the treating care team knows that the person has received a dose of this medication. You will receive additional  instructions on what to do during and after use of this medication before an emergency occurs.  What side effects may I notice  from receiving this medication? Side effects that you should report to your care team as soon as possible: Allergic reactions--skin rash, itching, hives, swelling of the face, lips, tongue, or throat Side effects that usually do not require medical attention (report these to your care team if they continue or are bothersome): Constipation Dryness or irritation inside the nose Headache Increase in blood pressure Muscle spasms Stuffy nose Toothache This list may not describe all possible side effects. Call your doctor for medical advice about side effects. You may report side effects to FDA at 1-800-FDA-1088.  Where should I keep my medication? Because this is an emergency medication, you should keep it with you at all times.  Keep out of the reach of children and pets. Store between 20 and 25 degrees C (68 and 77 degrees F). Do not freeze. Throw away any unused medication after the expiration date. Keep in original box until ready to use.  NOTE: This sheet is a summary. It may not cover all possible information. If you have questions about this medicine, talk to your doctor, pharmacist, or health care provider.   2023 Elsevier/Gold Standard (2021-02-09 00:00:00)  ______________________________________________________________________      ______________________________________________________________________    Opioid Pain Medication Update  To: All patients taking opioid pain medications. (I.e.: hydrocodone , hydromorphone , oxycodone , oxymorphone, morphine , codeine, methadone, tapentadol, tramadol , buprenorphine, fentanyl , etc.)  Re: Updated review of side effects and adverse reactions of opioid analgesics, as well as new information about long term effects of this class of medications.  Direct risks of long-term opioid therapy are not limited to opioid  addiction and overdose. Potential medical risks include serious fractures, breathing problems during sleep, hyperalgesia, immunosuppression, chronic constipation, bowel obstruction, myocardial infarction, and tooth decay secondary to xerostomia.  Unpredictable adverse effects that can occur even if you take your medication correctly: Cognitive impairment, respiratory depression, and death. Most people think that if they take their medication correctly, and as instructed, that they will be safe. Nothing could be farther from the truth. In reality, a significant amount of recorded deaths associated with the use of opioids has occurred in individuals that had taken the medication for a long time, and were taking their medication correctly. The following are examples of how this can happen: Patient taking his/her medication for a long time, as instructed, without any side effects, is given a certain antibiotic or another unrelated medication, which in turn triggers a Drug-to-drug interaction leading to disorientation, cognitive impairment, impaired reflexes, respiratory depression or an untoward event leading to serious bodily harm or injury, including death.  Patient taking his/her medication for a long time, as instructed, without any side effects, develops an acute impairment of liver and/or kidney function. This will lead to a rapid inability of the body to breakdown and eliminate their pain medication, which will result in effects similar to an overdose, but with the same medicine and dose that they had always taken. This again may lead to disorientation, cognitive impairment, impaired reflexes, respiratory depression or an untoward event leading to serious bodily harm or injury, including death.  A similar problem will occur with patients as they grow older and their liver and kidney function begins to decrease as part of the aging process.  Background information: Historically, the original case  for using long-term opioid therapy to treat chronic noncancer pain was based on safety assumptions that subsequent experience has called into question. In 1996, the American Pain Society and the American Academy of Pain Medicine issued a consensus statement  supporting long-term opioid therapy. This statement acknowledged the dangers of opioid prescribing but concluded that the risk for addiction was low; respiratory depression induced by opioids was short-lived, occurred mainly in opioid-naive patients, and was antagonized by pain; tolerance was not a common problem; and efforts to control diversion should not constrain opioid prescribing. This has now proven to be wrong. Experience regarding the risks for opioid addiction, misuse, and overdose in community practice has failed to support these assumptions.  According to the Centers for Disease Control and Prevention, fatal overdoses involving opioid analgesics have increased sharply over the past decade. Currently, more than 96,700 people die from drug overdoses every year. Opioids are a factor in 7 out of every 10 overdose deaths. Deaths from drug overdose have surpassed motor vehicle accidents as the leading cause of death for individuals between the ages of 69 and 46.  Clinical data suggest that neuroendocrine dysfunction may be very common in both men and women, potentially causing hypogonadism, erectile dysfunction, infertility, decreased libido, osteoporosis, and depression. Recent studies linked higher opioid dose to increased opioid-related mortality. Controlled observational studies reported that long-term opioid therapy may be associated with increased risk for cardiovascular events. Subsequent meta-analysis concluded that the safety of long-term opioid therapy in elderly patients has not been proven.   Side Effects and adverse reactions: Common side effects: Drowsiness (sedation). Dizziness. Nausea and vomiting. Constipation. Physical  dependence -- Dependence often manifests with withdrawal symptoms when opioids are discontinued or decreased. Tolerance -- As you take repeated doses of opioids, you require increased medication to experience the same effect of pain relief. Respiratory depression -- This can occur in healthy people, especially with higher doses. However, people with COPD, asthma or other lung conditions may be even more susceptible to fatal respiratory impairment.  Uncommon side effects: An increased sensitivity to feeling pain and extreme response to pain (hyperalgesia). Chronic use of opioids can lead to this. Delayed gastric emptying (the process by which the contents of your stomach are moved into your small intestine). Muscle rigidity. Immune system and hormonal dysfunction. Quick, involuntary muscle jerks (myoclonus). Arrhythmia. Itchy skin (pruritus). Dry mouth (xerostomia).  Long-term side effects: Chronic constipation. Sleep-disordered breathing (SDB). Increased risk of bone fractures. Hypothalamic-pituitary-adrenal dysregulation. Increased risk of overdose.  RISKS: Respiratory depression and death: Opioids increase the risk of respiratory depression and death.  Drug-to-drug interactions: Opioids are relatively contraindicated in combination with benzodiazepines, sleep inducers, and other central nervous system depressants. Other classes of medications (i.e.: certain antibiotics and even over-the-counter medications) may also trigger or induce respiratory depression in some patients.  Medical conditions: Patients with pre-existing respiratory problems are at higher risk of respiratory failure and/or depression when in combination with opioid analgesics. Opioids are relatively contraindicated in some medical conditions such as central sleep apnea.   Fractures and Falls:  Opioids increase the risk and incidence of falls. This is of particular importance in elderly patients.  Endocrine System:   Long-term administration is associated with endocrine abnormalities (endocrinopathies). (Also known as Opioid-induced Endocrinopathy) Influences on both the hypothalamic-pituitary-adrenal axis?and the hypothalamic-pituitary-gonadal axis have been demonstrated with consequent hypogonadism and adrenal insufficiency in both sexes. Hypogonadism and decreased levels of dehydroepiandrosterone sulfate have been reported in men and women. Endocrine effects include: Amenorrhoea in women (abnormal absence of menstruation) Reduced libido in both sexes Decreased sexual function Erectile dysfunction in men Hypogonadisms (decreased testicular function with shrinkage of testicles) Infertility Depression and fatigue Loss of muscle mass Anxiety Depression Immune suppression Hyperalgesia Weight gain Anemia Osteoporosis Patients (  particularly women of childbearing age) should avoid opioids. There is insufficient evidence to recommend routine monitoring of asymptomatic patients taking opioids in the long-term for hormonal deficiencies.  Immune System: Human studies have demonstrated that opioids have an immunomodulating effect. These effects are mediated via opioid receptors both on immune effector cells and in the central nervous system. Opioids have been demonstrated to have adverse effects on antimicrobial response and anti-tumour surveillance. Buprenorphine has been demonstrated to have no impact on immune function.  Opioid Induced Hyperalgesia: Human studies have demonstrated that prolonged use of opioids can lead to a state of abnormal pain sensitivity, sometimes called opioid induced hyperalgesia (OIH). Opioid induced hyperalgesia is not usually seen in the absence of tolerance to opioid analgesia. Clinically, hyperalgesia may be diagnosed if the patient on long-term opioid therapy presents with increased pain. This might be qualitatively and anatomically distinct from pain related to disease  progression or to breakthrough pain resulting from development of opioid tolerance. Pain associated with hyperalgesia tends to be more diffuse than the pre-existing pain and less defined in quality. Management of opioid induced hyperalgesia requires opioid dose reduction.  Cancer: Chronic opioid therapy has been associated with an increased risk of cancer among noncancer patients with chronic pain. This association was more evident in chronic strong opioid users. Chronic opioid consumption causes significant pathological changes in the small intestine and colon. Epidemiological studies have found that there is a link between opium  dependence and initiation of gastrointestinal cancers. Cancer is the second leading cause of death after cardiovascular disease. Chronic use of opioids can cause multiple conditions such as GERD, immunosuppression and renal damage as well as carcinogenic effects, which are associated with the incidence of cancers.   Mortality: Long-term opioid use has been associated with increased mortality among patients with chronic non-cancer pain (CNCP).  Prescription of long-acting opioids for chronic noncancer pain was associated with a significantly increased risk of all-cause mortality, including deaths from causes other than overdose.  Reference: Von Korff M, Kolodny A, Deyo RA, Chou R. Long-term opioid therapy reconsidered. Ann Intern Med. 2011 Sep 6;155(5):325-8. doi: 10.7326/[id number]. PMID: 78106373; PMCID: [id number]. Kit JINNY Laurence CINDERELLA Pearley JINNY, Hayward RA, Dunn KM, [place] KP. Risk of adverse events in patients prescribed long-term opioids: A cohort study in the [employer]. Eur J Pain. 2019 May;23(5):908-922. doi: 10.1002/ejp.1357. Epub 2019 Jan 31. PMID: 69379883. Colameco S, Coren JS, Ciervo CA. Continuous opioid treatment for chronic noncancer pain: a time for moderation in prescribing. Postgrad Med. 2009 Jul;121(4):61-6.  doi: 10.3810/pgm.2009.07.2032. PMID: 80358728. Gigi JONELLE Shlomo MILUS Levern IVER Conny RN, Covington SD, Blazina I, Lonell DASEN, Bougatsos C, Deyo RA. The effectiveness and risks of long-term opioid therapy for chronic pain: a systematic review for a [employer] to [employer]. Ann Intern Med. 2015 Feb 17;162(4):276-86. doi: 10.7326/M14-2559. PMID: 74418742. Rory CHRISTELLA Laurence [facility], [place] DM. NCHS Data Brief No. 22. [place]: Centers for Disease Control and Prevention; 2009. Sep, Increase in Fatal Poisonings Involving Opioid Analgesics in the [place] , 1999-2006. Song IA, Choi HR, Oh TK. Long-term opioid use and mortality in patients with chronic non-cancer pain: Ten-year follow-up study in [place] from 2010 through 2019. EClinicalMedicine. 2022 Jul 18;51:101558. doi: 10.1016/j.eclinm.2022.898441. PMID: 64124182; PMCID: EFR0695089. Huser, W., Schubert, T., Vogelmann, T. et al. All-cause mortality in patients with long-term opioid therapy compared with non-opioid analgesics for chronic non-cancer pain: a database study. BMC Med 18, 162 (2020). http://lester.info/ Rashidian H, Zendehdel K, Kamangar F, Malekzadeh R, Haghdoost AA.  An Ecological Study of the Association between Opiate Use and Incidence of Cancers. Addict Health. 2016 Fall;8(4):252-260. PMID: 71180443; PMCID: [id number].  Our Goal: Our goal is to control your pain with means other than the use of opioid pain medications.  Our Recommendation: Talk to your physician about coming off of these medications. We can assist you with the tapering down and stopping these medicines. Based on the new information, even if you cannot completely stop the medication, a decrease in the dose may be associated with a lesser risk. Ask for other means of controlling the pain. Decrease or eliminate those factors that significantly contribute to your pain such as smoking, obesity, and a diet heavily tilted towards  inflammatory nutrients.  Last Updated: 12/23/2022   ______________________________________________________________________       ______________________________________________________________________    National Pain Medication Shortage  The [place] is experiencing worsening drug shortages. These have had a negative widespread effect on patient care and treatment. Not expected to improve any time soon. Predicted to last past 2029.   Drug shortage list (generic names) Oxycodone  IR Oxycodone /APAP Oxymorphone IR Hydromorphone  Hydrocodone /APAP Morphine   Where is the problem?  Manufacturing and supply level.  Will this shortage affect you?  Only if you take any of the above pain medications.  How? You may be unable to fill your prescription.  Your pharmacist may offer a partial fill of your prescription. (Warning: Do not accept partial fills.) Prescriptions partially filled cannot be transferred to another pharmacy. Read our Medication Rules and Regulation. Depending on how much medicine you are dependent on, you may experience withdrawals when unable to get the medication.  Recommendations: Consider ending your dependence on opioid pain medications. Ask your pain specialist to assist you with the process. Consider switching to a medication currently not in shortage, such as Buprenorphine. Talk to your pain specialist about this option. Consider decreasing your pain medication requirements by managing tolerance thru Drug Holidays. This may help minimize withdrawals, should you run out of medicine. Control your pain thru the use of non-pharmacological interventional therapies.   Your prescriber: Prescribers cannot be blamed for shortages. Medication manufacturing and supply issues cannot be fixed by the prescriber.   NOTE: The prescriber is not responsible for supplying the medication, or solving supply issues. Work with your pharmacist to solve it. The patient is responsible  for the decision to take or continue taking the medication and for identifying and securing a legal supply source. By law, supplying the medication is the job and responsibility of the pharmacy. The prescriber is responsible for the evaluation, monitoring, and prescribing of these medications.   Prescribers will NOT: Re-issue prescriptions that have been partially filled. Re-issue prescriptions already sent to a pharmacy.  Re-send prescriptions to a different pharmacy because yours did not have your medication. Ask pharmacist to order more medicine or transfer the prescription to another pharmacy. (Read below.)  New 2023 regulation: February 15, 2022 Revised Regulation Allows DEA-Registered Pharmacies to Transfer Electronic Prescriptions at a Patient's Request DEA Headquarters Division - Public Information Office Patients now have the ability to request their electronic prescription be transferred to another pharmacy without having to go back to their practitioner to initiate the request. This revised regulation went into effect on Monday, February 11, 2022.     At a patient's request, a DEA-registered retail pharmacy can now transfer an electronic prescription for a controlled substance (schedules II-V) to another DEA-registered retail pharmacy. Prior to this change, patients would have to go through their practitioner  to cancel their prescription and have it re-issued to a different pharmacy. The process was taxing and time consuming for both patients and practitioners.    The Drug Enforcement Administration [facility]) published its intent to revise the process for transferring electronic prescriptions on May 05, 2020.  The final rule was published in the federal register on January 10, 2022 and went into effect 30 days later.  Under the final rule, a prescription can only be transferred once between pharmacies, and only if allowed under existing state or other applicable law. The prescription must  remain in its electronic form; may not be altered in any way; and the transfer must be communicated directly between two licensed pharmacists. It's important to note, any authorized refills transfer with the original prescription, which means the entire prescription will be filled at the same pharmacy.  Reference: HugeHand.is [facility] website announcement)  CheapWipes.at.pdf [employer])   [employer] / Vol. 88, No. 143 / Thursday, January 10, 2022 / Rules and Regulations DEPARTMENT OF JUSTICE  Drug Enforcement Administration  21 CFR Part 1306  [Docket No. [id number]]  RIN [id number] Transfer of Electronic Prescriptions for Schedules II-V Controlled Substances Between Pharmacies for Initial Filling  ______________________________________________________________________

## 2024-01-19 NOTE — Progress Notes (Signed)
 Safety precautions to be maintained throughout the outpatient stay will include: orient to surroundings, keep bed in low position, maintain call bell within reach at all times, provide assistance with transfer out of bed and ambulation.

## 2024-01-20 ENCOUNTER — Other Ambulatory Visit: Payer: Self-pay | Admitting: Nurse Practitioner

## 2024-01-20 ENCOUNTER — Ambulatory Visit: Payer: Self-pay | Admitting: Nurse Practitioner

## 2024-01-20 LAB — SEDIMENTATION RATE: Sed Rate: 12 mm/h (ref 0–32)

## 2024-01-20 LAB — VITAMIN B12: Vitamin B-12: 1965 pg/mL — ABNORMAL HIGH (ref 232–1245)

## 2024-01-20 LAB — MAGNESIUM: Magnesium: 2.1 mg/dL (ref 1.6–2.3)

## 2024-01-20 LAB — C-REACTIVE PROTEIN: CRP: 3 mg/L (ref 0–10)

## 2024-01-21 ENCOUNTER — Telehealth: Payer: MEDICAID | Admitting: Physician Assistant

## 2024-01-21 DIAGNOSIS — K529 Noninfective gastroenteritis and colitis, unspecified: Secondary | ICD-10-CM | POA: Diagnosis not present

## 2024-01-21 LAB — COMPLIANCE DRUG ANALYSIS, UR

## 2024-01-21 MED ORDER — DICYCLOMINE HCL 10 MG PO CAPS: 10.0000 mg | ORAL_CAPSULE | Freq: Three times a day (TID) | ORAL | 0 refills | Status: DC

## 2024-01-21 MED ORDER — PROMETHAZINE HCL 25 MG RE SUPP: 25.0000 mg | Freq: Four times a day (QID) | RECTAL | 0 refills | Status: DC | PRN

## 2024-01-21 NOTE — Patient Instructions (Signed)
 Andrea [name], thank you for joining Delon CHRISTELLA Dickinson, PA-C for today's virtual visit.  While this provider is not your primary care provider (PCP), if your PCP is located in our provider database this encounter information will be shared with them immediately following your visit.   A [facility] MyChart account gives you access to today's visit and all your visits, tests, and labs performed at [facility]  click here if you don't have a [facility] MyChart account or go to [email or web address]  Consent: (Patient) Andrea [name] provided verbal consent for this virtual visit at the beginning of the encounter.  Current Medications:  Current Outpatient Medications:    dicyclomine  (BENTYL ) 10 MG capsule, Take 1 capsule (10 mg total) by mouth 4 (four) times daily -  before meals and at bedtime., Disp: 28 capsule, Rfl: 0   promethazine  (PHENERGAN ) 25 MG suppository, Place 1 suppository (25 mg total) rectally every 6 (six) hours as needed for nausea or vomiting., Disp: 12 each, Rfl: 0   Albuterol -Budesonide  (AIRSUPRA) 90-80 MCG/ACT AERO, , Disp: , Rfl:    Atogepant  (QULIPTA ) 30 MG TABS, Take 1 tablet daily, Disp: 30 tablet, Rfl: 11   busPIRone  (BUSPAR ) 7.5 MG tablet, Take 1 tablet (7.5 mg total) by mouth 2 (two) times daily., Disp: 60 tablet, Rfl: 0   cetirizine  (ZYRTEC ) 10 MG tablet, Take 10 mg by mouth at bedtime., Disp: , Rfl:    cetirizine  (ZYRTEC ) 10 MG tablet, Take 10 mg by mouth., Disp: , Rfl:    clonazePAM  (KLONOPIN ) 0.5 MG tablet, Take 1 tablet (0.5 mg total) by mouth 2 (two) times daily as needed for anxiety., Disp: 60 tablet, Rfl: 2   clonazePAM  (KLONOPIN ) 0.5 MG tablet, Take 0.5 mg by mouth., Disp: , Rfl:    DULoxetine  (CYMBALTA ) 60 MG capsule, Take 2 capsules (120 mg total) by mouth daily., Disp: 180 capsule, Rfl: 0   eletriptan  (RELPAX ) 40 MG tablet, Take 1 tablet at onset of migraine. May repeat in 2 hours if headache persists or recurs. Do not take more  than 3 a week, Disp: 10 tablet, Rfl: 5   eletriptan  (RELPAX ) 40 MG tablet, 40 mg., Disp: , Rfl:    empagliflozin  (JARDIANCE ) 25 MG TABS tablet, Take 25 mg by mouth., Disp: , Rfl:    famotidine  (PEPCID ) 40 MG tablet, Take 1 tablet (40 mg total) by mouth 2 (two) times daily., Disp: 180 tablet, Rfl: 0   fluconazole  (DIFLUCAN ) 150 MG tablet, Take 150 mg by mouth once a week., Disp: , Rfl:    gabapentin  (NEURONTIN ) 600 MG tablet, Take 1 tablet (600 mg total) by mouth 3 (three) times daily., Disp: 90 tablet, Rfl: 3   GEMTESA 75 MG TABS, Take 1 tablet by mouth daily., Disp: , Rfl:    JARDIANCE  25 MG TABS tablet, Take 1 tablet (25 mg total) by mouth every morning., Disp: 90 tablet, Rfl: 3   montelukast  (SINGULAIR ) 10 MG tablet, TAKE 1 TABLET BY MOUTH EVERYDAY AT BEDTIME, Disp: 90 tablet, Rfl: 0   montelukast  (SINGULAIR ) 10 MG tablet, Take 10 mg by mouth., Disp: , Rfl:    naloxone  (NARCAN ) nasal spray 4 mg/0.1 mL, Place 1 spray into the nose as needed for up to 365 doses (for opioid-induced respiratory depresssion). In case of emergency (overdose), spray once into each nostril. If no response within 3 minutes, repeat application and call 911., Disp: 1 each, Rfl: 1   ondansetron  (ZOFRAN -ODT) 4 MG disintegrating tablet, Take 2 tablets (8 mg total)  by mouth every 8 (eight) hours as needed for nausea or vomiting., Disp: 30 tablet, Rfl: 0   oxyCODONE -acetaminophen  (PERCOCET) 10-325 MG tablet, Take 1 tablet by mouth every 4 (four) hours as needed for pain., Disp: 30 tablet, Rfl: 0   pantoprazole  (PROTONIX ) 40 MG tablet, TAKE 1 TABLET BY MOUTH EVERY DAY IN THE MORNING, Disp: 90 tablet, Rfl: 1   potassium chloride  (MICRO-K ) 10 MEQ CR capsule, TAKE 1 CAPSULE BY MOUTH EVERY DAY, Disp: 90 capsule, Rfl: 3   prazosin  (MINIPRESS ) 2 MG capsule, Take 2 mg by mouth., Disp: , Rfl:    QUEtiapine  (SEROQUEL ) 100 MG tablet, Take 100 mg by mouth 2 (two) times daily. Take 100 mg by mouth in AM and 200 mg by mouth in PM, Disp: , Rfl:     QUEtiapine  (SEROQUEL ) 25 MG tablet, 25 mg., Disp: , Rfl:    Semaglutide ,0.25 or 0.5MG /DOS, (OZEMPIC , 0.25 OR 0.5 MG/DOSE,) 2 MG/3ML SOPN, Inject 0.5 mg into the skin once a week., Disp: 3 mL, Rfl: 1   SEMAGLUTIDE ,0.25 OR 0.5MG /DOS, [place], Inject into the skin., Disp: , Rfl:    SYMBICORT 160-4.5 MCG/ACT inhaler, Inhale 2 puffs into the lungs., Disp: , Rfl:    tiZANidine  (ZANAFLEX ) 4 MG tablet, Take 4 mg by mouth every 8 (eight) hours as needed., Disp: , Rfl:    traZODone  (DESYREL ) 150 MG tablet, Take 1 tablet (150 mg total) by mouth at bedtime., Disp: 90 tablet, Rfl: 1   traZODone  (DESYREL ) 150 MG tablet, Take 150 mg by mouth., Disp: , Rfl:    zonisamide  (ZONEGRAN ) 100 MG capsule, Take 5 capsules every night, Disp: 150 capsule, Rfl: 11   zonisamide  (ZONEGRAN ) 100 MG capsule, 100 mg., Disp: , Rfl:    Medications ordered in this encounter:  Meds ordered this encounter  Medications   promethazine  (PHENERGAN ) 25 MG suppository    Sig: Place 1 suppository (25 mg total) rectally every 6 (six) hours as needed for nausea or vomiting.    Dispense:  12 each    Refill:  0    Supervising Provider:   BLAISE ALEENE KIDD [[id number]]   dicyclomine  (BENTYL ) 10 MG capsule    Sig: Take 1 capsule (10 mg total) by mouth 4 (four) times daily -  before meals and at bedtime.    Dispense:  28 capsule    Refill:  0    Supervising Provider:   BLAISE ALEENE KIDD [[id number]]     *If you need refills on other medications prior to your next appointment, please contact your pharmacy*  Follow-Up: Call back or seek an in-person evaluation if the symptoms worsen or if the condition fails to improve as anticipated.  [facility] [phone]  Other Instructions  Diarrhea Home Care In this video you will learn what you can do at home to manage diarrhea, including types of food and drink. The video also reviews when you should contact a health care provider. To view the content, go to this web  address: [email or web address]  This video will expire on: 05/28/2025. If you need access to this video following this date, please reach out to the healthcare provider who assigned it to you. This information is not intended to replace advice given to you by your health care provider. Make sure you discuss any questions you have with your health care provider. Elsevier Patient Education  2024 [employer].   If you have been instructed to have an in-person evaluation today at a local Urgent Care facility,  please use the link below. It will take you to a list of all of our available [facility], including address, phone number and hours of operation. Please do not delay care.  [facility]  If you or a family member do not have a primary care provider, use the link below to schedule a visit and establish care. When you choose a [facility] primary care physician or advanced practice provider, you gain a long-term partner in health. Find a Primary Care Provider  Learn more about [facility] in-office and virtual care options: [facility] - Get Care Now

## 2024-01-21 NOTE — Progress Notes (Signed)
 Virtual Visit Consent   Andrea [name], you are scheduled for a virtual visit with a [place] provider today. Just as with appointments in the office, your consent must be obtained to participate. Your consent will be active for this visit and any virtual visit you may have with one of our providers in the next 365 days. If you have a MyChart account, a copy of this consent can be sent to you electronically.  As this is a virtual visit, video technology does not allow for your provider to perform a traditional examination. This may limit your provider's ability to fully assess your condition. If your provider identifies any concerns that need to be evaluated in person or the need to arrange testing (such as labs, EKG, etc.), we will make arrangements to do so. Although advances in technology are sophisticated, we cannot ensure that it will always work on either your end or our end. If the connection with a video visit is poor, the visit may have to be switched to a telephone visit. With either a video or telephone visit, we are not always able to ensure that we have a secure connection.  By engaging in this virtual visit, you consent to the provision of healthcare and authorize for your insurance to be billed (if applicable) for the services provided during this visit. Depending on your insurance coverage, you may receive a charge related to this service.  I need to obtain your verbal consent now. Are you willing to proceed with your visit today? RAIN WILHIDE has provided verbal consent on 01/21/2024 for a virtual visit (video or telephone). Delon CHRISTELLA Dickinson, PA-C  Date: 01/21/2024 6:03 PM   Virtual Visit via Video Note   I, Delon CHRISTELLA Dickinson, connected with  Andrea [name]  ([id number], Feb 15, 1981) on 01/21/24 at  5:45 PM EDT by a video-enabled telemedicine application and verified that I am speaking with the correct person using two identifiers.  Location: Patient: Virtual Visit  Location Patient: Home Provider: Virtual Visit Location Provider: Home Office   I discussed the limitations of evaluation and management by telemedicine and the availability of in person appointments. The patient expressed understanding and agreed to proceed.    History of Present Illness: Andrea [name] is a 43 y.o. who identifies as a female who was assigned female at birth, and is being seen today for nausea, vomiting, diarrhea, abdominal cramping.  HPI: Emesis  This is a new problem. The current episode started 1 to 4 weeks ago (2 weeks with diarrhea, then cramping and vomiting started over last 2 days). The problem occurs 5 to 10 times per day. The problem has been gradually worsening. Emesis appearance: stomach acid. The maximum temperature recorded prior to her arrival was 100.4 - 100.9 F. The fever has been present for Less than 1 day. Associated symptoms include abdominal pain (cramping), diarrhea (watery and frequent; having fecal incontinence with passing gas), a fever and myalgias. Pertinent negatives include no chest pain, chills, dizziness or sweats. Associated symptoms comments: anorexia. Risk factors include ill contacts (had been at [facility] for some appointments). She has tried increased fluids and bed rest (zofran , imodium) for the symptoms. The treatment provided no relief.    Patient has been on Augmentin  recently for an ESBL E.Coli Uti on 01/06/24, completing a 7 day course.  Problems:  Patient Active Problem List   Diagnosis Date Noted   Long term prescription benzodiazepine use 01/19/2024   Long-term current use of opiate  analgesic 01/19/2024   Chronic prescription opiate use 01/19/2024   Closed ankle fracture, sequela (Right) 01/19/2024   Chronic postoperative pain of ankle (Right) 01/19/2024   Chronic pain syndrome 01/18/2024   Pharmacologic therapy 01/18/2024   Disorder of skeletal system 01/18/2024   Problems influencing health status 01/18/2024   Delayed  union of ankle fracture, closed (Right) 01/14/2024   Fall at home, sequela 01/14/2024   Neuropathic ulcer of heel, limited to breakdown of skin (HCC) (Right) 01/14/2024   Elevated blood pressure reading 12/26/2023   Schizoaffective disorder, bipolar type (HCC) 12/05/2023   Diabetic nephropathy associated with type 2 diabetes mellitus (HCC) 07/23/2023   Acute right-sided low back pain without sciatica 07/18/2023   Acute flank pain (Right) 07/18/2023   Fluctuating blood pressure 07/18/2023   Hypotension 07/02/2023   Occasional tremors 07/02/2023   Constipation 07/02/2023   Syncope due to orthostatic hypotension 06/20/2023   Closed trimalleolar fracture of ankle, sequela (Right) 05/20/2023   Bipolar affective disorder, mixed, severe, with psychotic behavior (HCC) 05/20/2023   Panic disorder 05/20/2023   Acquired varus deformity of ankle (Right) 05/13/2023   Postoperative surgical complication involving musculoskeletal system associated with musculoskeletal procedure 05/12/2023   Bipolar 1 disorder (HCC) 05/03/2023   Type 2 diabetes mellitus with peripheral neuropathy (HCC) 05/03/2023   Mild persistent asthma 05/03/2023   Pre-syncope 05/03/2023   Chronic ankle pain (Right) 07/16/2022   PTSD (post-traumatic stress disorder) 03/13/2022   Generalized anxiety disorder with panic attacks 03/13/2022   Major depressive disorder, 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 03/13/2022   Other insomnia 03/13/2022   Long term current use of antipsychotic medication 03/13/2022   Convulsion (HCC) 11/26/2021   HTN (hypertension) 09/18/2016   Acute pyelonephritis 01/16/2013   Migraines 10/30/2012   Obesity, Class III, BMI 40-49.9 (morbid obesity) (HCC) 10/30/2012   Kidney stones     Allergies:  Allergies  Allergen Reactions   Bactrim [Sulfamethoxazole -Trimethoprim] Hives and Itching   Codeine Hives and Itching    Medications:  Current Outpatient Medications:    dicyclomine  (BENTYL ) 10 MG capsule, Take 1 capsule (10 mg total) by mouth 4 (four) times daily -  before meals and at bedtime., Disp: 28 capsule, Rfl: 0   promethazine  (PHENERGAN ) 25 MG suppository, Place 1 suppository (25 mg total) rectally every 6 (six) hours as needed for nausea or vomiting., Disp: 12 each, Rfl: 0   Albuterol -Budesonide  (AIRSUPRA) 90-80 MCG/ACT AERO, , Disp: , Rfl:    Atogepant  (QULIPTA ) 30 MG TABS, Take 1 tablet daily, Disp: 30 tablet, Rfl: 11   busPIRone  (BUSPAR ) 7.5 MG tablet, Take 1 tablet (7.5 mg total) by mouth 2 (two) times daily., Disp: 60 tablet, Rfl: 0   cetirizine  (ZYRTEC ) 10 MG tablet, Take 10 mg by mouth at bedtime., Disp: , Rfl:    cetirizine  (ZYRTEC ) 10 MG tablet, Take 10 mg by mouth., Disp: , Rfl:    clonazePAM  (KLONOPIN ) 0.5 MG tablet, Take 1 tablet (0.5 mg total) by mouth 2 (two) times daily as needed for anxiety., Disp: 60 tablet, Rfl: 2   clonazePAM  (KLONOPIN ) 0.5 MG tablet, Take 0.5 mg by mouth., Disp: , Rfl:    DULoxetine  (CYMBALTA ) 60 MG capsule, Take 2 capsules (120 mg total) by mouth daily., Disp: 180 capsule, Rfl: 0   eletriptan  (RELPAX ) 40 MG tablet, Take 1 tablet at onset of migraine. May repeat in 2 hours if headache persists or recurs. Do not take more than 3 a  week, Disp: 10 tablet, Rfl: 5   eletriptan  (RELPAX ) 40 MG tablet, 40 mg., Disp: , Rfl:    empagliflozin  (JARDIANCE ) 25 MG TABS tablet, Take 25 mg by mouth., Disp: , Rfl:    famotidine  (PEPCID ) 40 MG tablet, Take 1 tablet (40 mg total) by mouth 2 (two) times daily., Disp: 180 tablet, Rfl: 0   fluconazole  (DIFLUCAN ) 150 MG tablet, Take 150 mg by mouth once a week., Disp: , Rfl:    gabapentin  (NEURONTIN ) 600 MG tablet, Take 1 tablet (600 mg total) by mouth 3 (three) times daily., Disp: 90 tablet, Rfl: 3   GEMTESA 75 MG TABS, Take 1 tablet by mouth daily., Disp: , Rfl:    JARDIANCE  25 MG TABS tablet, Take 1 tablet (25 mg total) by mouth every  morning., Disp: 90 tablet, Rfl: 3   montelukast  (SINGULAIR ) 10 MG tablet, TAKE 1 TABLET BY MOUTH EVERYDAY AT BEDTIME, Disp: 90 tablet, Rfl: 0   montelukast  (SINGULAIR ) 10 MG tablet, Take 10 mg by mouth., Disp: , Rfl:    naloxone  (NARCAN ) nasal spray 4 mg/0.1 mL, Place 1 spray into the nose as needed for up to 365 doses (for opioid-induced respiratory depresssion). In case of emergency (overdose), spray once into each nostril. If no response within 3 minutes, repeat application and call 911., Disp: 1 each, Rfl: 1   ondansetron  (ZOFRAN -ODT) 4 MG disintegrating tablet, Take 2 tablets (8 mg total) by mouth every 8 (eight) hours as needed for nausea or vomiting., Disp: 30 tablet, Rfl: 0   oxyCODONE -acetaminophen  (PERCOCET) 10-325 MG tablet, Take 1 tablet by mouth every 4 (four) hours as needed for pain., Disp: 30 tablet, Rfl: 0   pantoprazole  (PROTONIX ) 40 MG tablet, TAKE 1 TABLET BY MOUTH EVERY DAY IN THE MORNING, Disp: 90 tablet, Rfl: 1   potassium chloride  (MICRO-K ) 10 MEQ CR capsule, TAKE 1 CAPSULE BY MOUTH EVERY DAY, Disp: 90 capsule, Rfl: 3   prazosin  (MINIPRESS ) 2 MG capsule, Take 2 mg by mouth., Disp: , Rfl:    QUEtiapine  (SEROQUEL ) 100 MG tablet, Take 100 mg by mouth 2 (two) times daily. Take 100 mg by mouth in AM and 200 mg by mouth in PM, Disp: , Rfl:    QUEtiapine  (SEROQUEL ) 25 MG tablet, 25 mg., Disp: , Rfl:    Semaglutide ,0.25 or 0.5MG /DOS, (OZEMPIC , 0.25 OR 0.5 MG/DOSE,) 2 MG/3ML SOPN, Inject 0.5 mg into the skin once a week., Disp: 3 mL, Rfl: 1   SEMAGLUTIDE ,0.25 OR 0.5MG /DOS, [place], Inject into the skin., Disp: , Rfl:    SYMBICORT 160-4.5 MCG/ACT inhaler, Inhale 2 puffs into the lungs., Disp: , Rfl:    tiZANidine  (ZANAFLEX ) 4 MG tablet, Take 4 mg by mouth every 8 (eight) hours as needed., Disp: , Rfl:    traZODone  (DESYREL ) 150 MG tablet, Take 1 tablet (150 mg total) by mouth at bedtime., Disp: 90 tablet, Rfl: 1   traZODone  (DESYREL ) 150 MG tablet, Take 150 mg by mouth., Disp: , Rfl:     zonisamide  (ZONEGRAN ) 100 MG capsule, Take 5 capsules every night, Disp: 150 capsule, Rfl: 11   zonisamide  (ZONEGRAN ) 100 MG capsule, 100 mg., Disp: , Rfl:   Observations/Objective: Patient is well-developed, well-nourished in no acute distress.  Resting comfortably at home.  Head is normocephalic, atraumatic.  No labored breathing.  Speech is clear and coherent with logical content.  Patient is alert and oriented at baseline.    Assessment and Plan: 1. Gastroenteritis (Primary) - promethazine  (PHENERGAN ) 25 MG suppository; Place 1 suppository (25  mg total) rectally every 6 (six) hours as needed for nausea or vomiting.  Dispense: 12 each; Refill: 0 - dicyclomine  (BENTYL ) 10 MG capsule; Take 1 capsule (10 mg total) by mouth 4 (four) times daily -  before meals and at bedtime.  Dispense: 28 capsule; Refill: 0  - Worsening over 2 week period following antibiotic use for a UTI, but also could have had sick contact as she was seen at [facility] and also had some other in person appointments - Does report diarrhea started following the antibiotic - Concerns for C.difficile  - Patient limited in mobility due to right ankle fracture and delayed healing - Has failed Zofran  and Imodium - Will add Dicyclomine  for cramping and diarrhea - Continue Imodium - Change to Phenergan  suppository for nausea and vomiting, discussed if having frequent diarrhea may not be as effective - Push fluids, electrolyte beverages - Liquid diet, then increase to soft/bland (BRAT) diet over next day, then increase diet as tolerated - Seek in person evaluation if not improving or symptoms worsen   Follow Up Instructions: I discussed the assessment and treatment plan with the patient. The patient was provided an opportunity to ask questions and all were answered. The patient agreed with the plan and demonstrated an understanding of the instructions.  A copy of instructions were sent to the patient via MyChart unless  otherwise noted below.    The patient was advised to call back or seek an in-person evaluation if the symptoms worsen or if the condition fails to improve as anticipated.    Delon CHRISTELLA Dickinson, PA-C

## 2024-01-23 ENCOUNTER — Encounter: Payer: Self-pay | Admitting: Podiatry

## 2024-01-26 ENCOUNTER — Encounter: Payer: Self-pay | Admitting: Nurse Practitioner

## 2024-01-26 ENCOUNTER — Encounter: Payer: Self-pay | Admitting: Podiatry

## 2024-01-26 DIAGNOSIS — R399 Unspecified symptoms and signs involving the genitourinary system: Secondary | ICD-10-CM | POA: Insufficient documentation

## 2024-01-26 NOTE — Assessment & Plan Note (Signed)
 Her condition is well-managed on the current medication regimen with no recent episodes. She is transitioning to a new psychiatrist as the current one is retiring. Refill Klonopin  prescription with a three-month supply and continue the current psychiatric medication regimen. PDMP reviewed.

## 2024-01-26 NOTE — Assessment & Plan Note (Addendum)
 Resolved with antibiotics. Completed course. Denies urinary symptoms today. Return precautions given to patient. Urgent Care/ED labs and notes reviewed today.

## 2024-01-26 NOTE — Assessment & Plan Note (Signed)
 Blood pressure is elevated at times, likely due to pain and illness. Not on any medications. Current reading is 128/76, which is satisfactory. Monitor blood pressure regularly to ensure it remains below 130/90.

## 2024-01-26 NOTE — Assessment & Plan Note (Addendum)
 A1c is 7.0, acceptable for surgical clearance but ideally should be below 7.0. Blood sugars are elevated likely due to recent illness. Increase Ozempic  to 0.5 mg once weekly and monitor blood sugar levels. Encourage healthy diet and activity as tolerated.

## 2024-01-27 ENCOUNTER — Ambulatory Visit
Admission: RE | Admit: 2024-01-27 | Discharge: 2024-01-27 | Disposition: A | Discharge status: Home / Self Care | Payer: MEDICAID | Source: Ambulatory Visit | Attending: Nurse Practitioner | Admitting: Nurse Practitioner

## 2024-01-27 ENCOUNTER — Other Ambulatory Visit (INDEPENDENT_AMBULATORY_CARE_PROVIDER_SITE_OTHER): Payer: MEDICAID | Admitting: Podiatry

## 2024-01-27 ENCOUNTER — Ambulatory Visit
Admission: RE | Admit: 2024-01-27 | Discharge: 2024-01-27 | Disposition: A | Discharge status: Home / Self Care | Payer: MEDICAID | Source: Ambulatory Visit | Attending: Nurse Practitioner

## 2024-01-27 DIAGNOSIS — Z1231 Encounter for screening mammogram for malignant neoplasm of breast: Secondary | ICD-10-CM | POA: Insufficient documentation

## 2024-01-27 DIAGNOSIS — Z8781 Personal history of (healed) traumatic fracture: Secondary | ICD-10-CM | POA: Insufficient documentation

## 2024-01-27 MED ORDER — OXYCODONE-ACETAMINOPHEN 5-325 MG PO TABS
1.0000 | ORAL_TABLET | ORAL | 0 refills | Status: DC | PRN
Start: 2024-01-27 — End: 2024-02-03

## 2024-01-27 NOTE — Progress Notes (Signed)
 Refill of percocet sent

## 2024-02-02 ENCOUNTER — Ambulatory Visit: Payer: MEDICAID | Admitting: Orthopedic Surgery

## 2024-02-03 ENCOUNTER — Other Ambulatory Visit: Payer: Self-pay | Admitting: Podiatry

## 2024-02-03 ENCOUNTER — Encounter: Payer: Self-pay | Admitting: Podiatry

## 2024-02-03 ENCOUNTER — Telehealth: Payer: MEDICAID | Admitting: Nurse Practitioner

## 2024-02-03 DIAGNOSIS — J324 Chronic pansinusitis: Secondary | ICD-10-CM

## 2024-02-03 MED ORDER — OXYCODONE-ACETAMINOPHEN 5-325 MG PO TABS: 1.0000 | ORAL_TABLET | ORAL | 0 refills | Status: AC | PRN

## 2024-02-03 MED ORDER — AMOXICILLIN-POT CLAVULANATE 875-125 MG PO TABS
1.0000 | ORAL_TABLET | Freq: Two times a day (BID) | ORAL | 0 refills | Status: AC
Start: 2024-02-03 — End: 2024-02-10

## 2024-02-03 NOTE — Progress Notes (Signed)
 E-Visit for Sinus Problems  We are sorry that you are not feeling well.  Here is how we plan to help!  Based on what you have shared with me it looks like you have sinusitis.  Sinusitis is inflammation and infection in the sinus cavities of the head.  Based on your presentation I believe you most likely have Acute Bacterial Sinusitis.  This is an infection caused by bacteria and is treated with antibiotics. I have prescribed Augmentin  875mg /125mg  one tablet twice daily with food, for 7 days.   If you have been using Zyrtec  for an extended amount of time it may be helpful to switch to Claritin  or Allegra.  The nasal sprays may also be helpful, I know you mentioned they don't work for you- but you do need to use them consistently in order for them to work. If you have not used them daily in the past I would also recommend restarting a Flonase  product daily   You may use an oral decongestant such as Mucinex  D or if you have glaucoma or high blood pressure use plain Mucinex . Saline nasal spray help and can safely be used as often as needed for congestion.  If you develop worsening sinus pain, fever or notice severe headache and vision changes, or if symptoms are not better after completion of antibiotic, please schedule an appointment with a health care provider.    Sinus infections are not as easily transmitted as other respiratory infection, however we still recommend that you avoid close contact with loved ones, especially the very young and elderly.  Remember to wash your hands thoroughly throughout the day as this is the number one way to prevent the spread of infection!  Home Care: Only take medications as instructed by your medical team. Complete the entire course of an antibiotic. Do not take these medications with alcohol . A steam or ultrasonic humidifier can help congestion.  You can place a towel over your head and breathe in the steam from hot water coming from a faucet. Avoid close  contacts especially the very young and the elderly. Cover your mouth when you cough or sneeze. Always remember to wash your hands.  Get Help Right Away If: You develop worsening fever or sinus pain. You develop a severe head ache or visual changes. Your symptoms persist after you have completed your treatment plan.  Make sure you Understand these instructions. Will watch your condition. Will get help right away if you are not doing well or get worse.  Thank you for choosing an e-visit.  Your e-visit answers were reviewed by a board certified advanced clinical practitioner to complete your personal care plan. Depending upon the condition, your plan could have included both over the counter or prescription medications.  Please review your pharmacy choice. Make sure the pharmacy is open so you can pick up prescription now. If there is a problem, you may contact your provider through [employer] and have the prescription routed to another pharmacy.  Your safety is important to us . If you have drug allergies check your prescription carefully.   For the next 24 hours you can use MyChart to ask questions about today's visit, request a non-urgent call back, or ask for a work or school excuse. You will get an email in the next two days asking about your experience. I hope that your e-visit has been valuable and will speed your recovery.  There are other unrelated non-urgent complaints, but due to the busy schedule and the  amount of time I've already spent with her, time does not permit me to address these routine issues at today's visit. I've requested another appointment to review these additional issues.

## 2024-02-04 ENCOUNTER — Other Ambulatory Visit: Payer: Self-pay

## 2024-02-04 ENCOUNTER — Ambulatory Visit: Payer: MEDICAID | Admitting: Orthopedic Surgery

## 2024-02-04 ENCOUNTER — Other Ambulatory Visit (INDEPENDENT_AMBULATORY_CARE_PROVIDER_SITE_OTHER): Payer: MEDICAID

## 2024-02-04 ENCOUNTER — Telehealth: Payer: Self-pay | Admitting: Orthopedic Surgery

## 2024-02-04 DIAGNOSIS — S8002XA Contusion of left knee, initial encounter: Secondary | ICD-10-CM

## 2024-02-04 DIAGNOSIS — S8991XA Unspecified injury of right lower leg, initial encounter: Secondary | ICD-10-CM | POA: Diagnosis not present

## 2024-02-04 DIAGNOSIS — M25561 Pain in right knee: Secondary | ICD-10-CM | POA: Diagnosis not present

## 2024-02-04 DIAGNOSIS — M1711 Unilateral primary osteoarthritis, right knee: Secondary | ICD-10-CM

## 2024-02-04 DIAGNOSIS — M1712 Unilateral primary osteoarthritis, left knee: Secondary | ICD-10-CM | POA: Diagnosis not present

## 2024-02-04 NOTE — Telephone Encounter (Signed)
 Need to call patient/ I can get her the Playmaker  Aleck said was fine I had to check on it since its Playmaker and she has Medicaid

## 2024-02-04 NOTE — Telephone Encounter (Signed)
 I called patient she will be here at 10 to get a playmaker brace from me.

## 2024-02-04 NOTE — Progress Notes (Signed)
 New problem  Andrea [name] was seen for issues with her knee in February 2025 but she fell.  Up until that point she had been doing reasonably well  She fell landed on both knees complains of right greater than left knee pain but bilateral knee pain nonetheless  She has tried Tylenol  and ibuprofen  without successful relief of her pain  She is able to ambulate but walks with a limp favoring the right leg   When we examine the right knee we see a 1+ posterior drawer sign on on the left side a trace posterior drawer sign  She can fully flex and extend the knee there is no collateral ligament instability there is tenderness diffusely around both knees  Imaging studies were obtained she has osteoarthritis in both knees grade 1 no acute fracture  She probably has a grade 1 PCL on the right none on the left probably a retropatellar contusion on the left  Encounter Diagnoses  Name Primary?   Primary osteoarthritis of right knee    Primary osteoarthritis of left knee    PCL injury, right, initial encounter Yes   Contusion of left knee, initial encounter     Recommend playmaker brace on the right with physical therapy  Retropatellar contusion continue ibuprofen  Tylenol  range of motion and strengthening exercises  Recheck 6 weeks

## 2024-02-04 NOTE — Progress Notes (Signed)
   LMP  (LMP Unknown)   There is no height or weight on file to calculate BMI.  Chief Complaint  Patient presents with   Knee Pain    Encounter Diagnoses  Name Primary?   Primary osteoarthritis of right knee Yes   Primary osteoarthritis of left knee

## 2024-02-06 ENCOUNTER — Encounter: Payer: Self-pay | Admitting: Pain Medicine

## 2024-02-06 ENCOUNTER — Encounter: Payer: Self-pay | Admitting: Orthopedic Surgery

## 2024-02-08 NOTE — Progress Notes (Addendum)
 Department:  [facility]) Date: 02/09/2024  Event: NO SHOW.  Encounter Type: (2nd V) Plan of care visit.          Advance notice: None  Reason: Unknown.          Significance: Unintended waste of community medical resources.  Review of initial evaluation (01/19/2024): Andrea [name] is a 43 year old female who presents with right ankle pain and limited mobility following multiple surgeries for a fracture.   In November, she sustained a fracture to her right ankle after a fall. 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 Despite the surgeries, she has persistent pain in the right ankle, especially when walking.   She has completed twelve weeks of physical therapy, attending sessions twice a week, but reports no improvement in her symptoms. She experiences significant limitations in mobility as her right ankle is fused, resulting in no movement at all.  Review of diagnostic test ordered on 01/19/2024:  Diagnostic lab work: C-reactive protein, sed rate, and magnesium  levels were all within normal limits.  Increasedly elevated vitamin B12 levels.  UDS positive for clonazepam  and its metabolites as well as oxycodone  and its metabolites.  Controlled Substance Pharmacotherapy Assessment REMS (Risk Evaluation and Mitigation Strategy)  Opioid Analgesic: Oxycodone /APAP 5/325, 1 tab p.o. every 4 hours (# 30) (last filled on 02/03/2024) (According to PMP, patient has been on some form of opioid analgesics since before September 2023.) MME/day: 32.14-90 mg/day    Monitoring: [place] PMP: PDMP reviewed during this encounter. Online review of the past 31-month period previously conducted. Not applicable at this point since we have not taken over the patient's medication management yet. List of other Serum/Urine Drug Screening Test(s):  Lab Results  Component Value  Date   COCAINSCRNUR NONE DETECTED 05/27/2023   COCAINSCRNUR NONE DETECTED 11/27/2021   COCAINSCRNUR NONE DETECTED 12/20/2016   THCU NONE DETECTED 05/27/2023   THCU NONE DETECTED 11/27/2021   THCU NONE DETECTED 12/20/2016   ETH <5 12/20/2016   List of all UDS test(s) done:  Lab Results  Component Value Date   SUMMARY FINAL 01/19/2024   Last UDS on record: Summary  Date Value Ref Range Status  01/19/2024 FINAL  Final    Comment:    ==================================================================== Compliance Drug Analysis, Ur ==================================================================== Test                             Result       Flag       Units  Drug Present and Declared for Prescription Verification   7-aminoclonazepam              36           EXPECTED   ng/mg creat    7-aminoclonazepam is an expected metabolite of clonazepam . Source of    clonazepam  is a scheduled prescription medication.    Oxycodone                       2076         EXPECTED   ng/mg creat   Oxymorphone                    106          EXPECTED   ng/mg creat   Noroxycodone  3254         EXPECTED   ng/mg creat   Noroxymorphone                 255          EXPECTED   ng/mg creat    Sources of oxycodone  are scheduled prescription medications.    Oxymorphone, noroxycodone, and noroxymorphone are expected    metabolites of oxycodone . Oxymorphone is also available as a    scheduled prescription medication.    Gabapentin                      PRESENT      EXPECTED   Zonisamide                      PRESENT      EXPECTED   Tizanidine                      PRESENT      EXPECTED   Trazodone                       PRESENT      EXPECTED   1,3 chlorophenyl piperazine    PRESENT      EXPECTED    1,3-chlorophenyl piperazine is an expected metabolite of trazodone .    Duloxetine                      PRESENT      EXPECTED   Quetiapine                      PRESENT      EXPECTED   Acetaminophen                    PRESENT      EXPECTED  Drug Present not Declared for Prescription Verification   Alcohol , Ethyl                 0.040        UNEXPECTED g/dL    Sources of ethyl alcohol  include alcoholic beverages or as a    fermentation product of glucose; glucose is present in this specimen.    Interpret result with caution, as the presence of ethyl alcohol  is    likely due, at least in part, to fermentation of glucose.    Ibuprofen                       PRESENT      UNEXPECTED   Naproxen                        PRESENT      UNEXPECTED ==================================================================== Test                      Result    Flag   Units      Ref Range   Creatinine              186              mg/dL      >=79 ==================================================================== Declared Medications:  The flagging and interpretation on this report are based on the  following declared medications.  Unexpected results may arise from  inaccuracies in the declared medications.   **Note: The testing scope of this panel includes these medications:   Clonazepam  (Klonopin )  Duloxetine  (Cymbalta )  Gabapentin  (Neurontin )  Oxycodone  (Percocet)  Quetiapine  (Seroquel )  Trazodone  (Desyrel )  Zonisamide  (Zonegran )   **Note: The testing scope of this panel does not include small to  moderate amounts of these reported medications:   Acetaminophen  (Percocet)  Tizanidine  (Zanaflex )   **Note: The testing scope of this panel does not include the  following reported medications:   Albuterol  (Airsupra)  Atogepant  (Qulipta )  Budesonide  (Airsupra)  Budesonide  (Symbicort)  Buspirone  (Buspar )  Cetirizine  (Zyrtec )  Eletriptan  (Relpax )  Empagliflozin  (Jardiance )  Famotidine  (Pepcid )  Fluconazole  (Diflucan )  Formoterol  (Symbicort)  Montelukast  (Singulair )  Naloxone  (Narcan )  Ondansetron  (Zofran )  Pantoprazole  (Protonix )  Potassium Chloride   Prazosin  (Minipress )  Semaglutide  (Ozempic )   Vibegron (Gemtesa) ==================================================================== For clinical consultation, please call [phone]. ====================================================================     Risk Assessment Profile: Opioid risk tool (ORT):     01/19/2024    9:51 AM  Opioid Risk   Alcohol  0  Illegal Drugs 0  Rx Drugs 0  Alcohol  0  Illegal Drugs 0  Rx Drugs 0  Age between 16-45 years  1  Psychological Disease 0  Depression 1  Opioid Risk Tool Scoring 2  Opioid Risk Interpretation Low Risk    ORT Scoring interpretation table:  Score <3 = Low Risk for SUD  Score between 4-7 = Moderate Risk for SUD  Score >8 = High Risk for Opioid Abuse    Laboratory Chemistry Profile   Renal Lab Results  Component Value Date   BUN 16 01/16/2024   CREATININE 0.80 01/16/2024   BCR 20 12/31/2016   GFR 90.27 01/16/2024   GFRAA >60 01/01/2020   GFRNONAA >60 01/06/2024   SPECGRAV 1.015 01/06/2024   PHUR 6.0 01/06/2024   PROTEINUR trace (A) 01/06/2024     Electrolytes Lab Results  Component Value Date   NA 140 01/16/2024   K 4.0 01/16/2024   CL 102 01/16/2024   CALCIUM 9.2 01/16/2024   MG 2.1 01/19/2024   PHOS 4.5 11/26/2021     Hepatic Lab Results  Component Value Date   AST 18 01/16/2024   ALT 13 01/16/2024   ALBUMIN 4.2 01/16/2024   ALKPHOS 110 01/16/2024   AMYLASE 54 08/18/2007   LIPASE 57 (H) 03/30/2023     ID Lab Results  Component Value Date   HIV Non Reactive 05/04/2023   SARSCOV2NAA NEGATIVE 03/30/2023   STAPHAUREUS NEGATIVE 05/13/2023   MRSAPCR NEGATIVE 05/13/2023   PREGTESTUR NEGATIVE 01/01/2020     Bone Lab Results  Component Value Date   VD25OH 38.99 01/16/2024     Endocrine Lab Results  Component Value Date   GLUCOSE 135 (H) 01/16/2024   GLUCOSEU >=500 (A) 12/31/2023   HGBA1C 7.1 (H) 08/05/2023   TSH 0.66 01/16/2024     Neuropathy Lab Results  Component Value Date   VITAMINB12 1,965 (H) 01/19/2024   FOLATE 8.8  06/26/2023   HGBA1C 7.1 (H) 08/05/2023   HIV Non Reactive 05/04/2023     CNS No results found for: COLORCSF, APPEARCSF, RBCCOUNTCSF, WBCCSF, POLYSCSF, LYMPHSCSF, EOSCSF, PROTEINCSF, GLUCCSF, JCVIRUS, CSFOLI, IGGCSF, LABACHR, ACETBL   Inflammation (CRP: Acute  ESR: Chronic) Lab Results  Component Value Date   CRP 3 01/19/2024   ESRSEDRATE 12 01/19/2024   LATICACIDVEN 1.6 05/27/2023     Rheumatology No results found for: RF, ANA, LABURIC, URICUR, LYMEIGGIGMAB, LYMEABIGMQN, HLAB27   Coagulation Lab Results  Component Value Date   INR 1.1 11/26/2021   LABPROT 14.4 11/26/2021   PLT 211 01/06/2024   DDIMER <0.27 07/19/2023  Cardiovascular Lab Results  Component Value Date   CKTOTAL 79 07/31/2021   HGB 12.0 01/06/2024   HCT 39.2 01/06/2024     Screening Lab Results  Component Value Date   SARSCOV2NAA NEGATIVE 03/30/2023   STAPHAUREUS NEGATIVE 05/13/2023   MRSAPCR NEGATIVE 05/13/2023   HIV Non Reactive 05/04/2023   PREGTESTUR NEGATIVE 01/01/2020     Cancer No results found for: CEA, CA125, LABCA2   Allergens No results found for: ALMOND, APPLE, ASPARAGUS, AVOCADO, BANANA, BARLEY, BASIL, BAYLEAF, GREENBEAN, LIMABEAN, WHITEBEAN, BEEFIGE, REDBEET, BLUEBERRY, BROCCOLI, CABBAGE, MELON, CARROT, CASEIN, CASHEWNUT, CAULIFLOWER, CELERY     Note: Lab results reviewed.  Recent Diagnostic Imaging Review  Knee Imaging: Knee-R DG 4 views: Results for orders placed during the hospital encounter of 10/04/22 DG Knee Complete 4 Views Right  Narrative CLINICAL DATA:  eval for injury.  Fall  EXAM: RIGHT KNEE - COMPLETE 4+ VIEW  COMPARISON:  None Available.  FINDINGS: No evidence of fracture, dislocation, or joint effusion. No evidence of arthropathy or other focal bone abnormality. Mild subcutaneus soft tissue edema.  IMPRESSION: No acute displaced fracture or  dislocation.   Electronically Signed By: Morgane  Naveau M.D. On: 10/04/2022 01:56  Knee-L DG 4 views: Results for orders placed during the hospital encounter of 10/04/22 DG Knee Complete 4 Views Left  Narrative CLINICAL DATA:  eval for injury  EXAM: LEFT KNEE - COMPLETE 4+ VIEW  COMPARISON:  None Available.  FINDINGS: Vague vertical lucency along the medial posterior humeral condyle with no definite acute fracture. Otherwise no evidence of fracture, dislocation, or joint effusion. No evidence of arthropathy or other focal bone abnormality. Mild subcutaneus soft tissue edema.  IMPRESSION: 1. Vague vertical lucency along the medial humeral condyle with no definite acute fracture. Correlate with point tenderness to palpation. 2. Otherwise no acute displaced fracture or dislocation.   Electronically Signed By: Morgane  Naveau M.D. On: 10/04/2022 01:58  Ankle Imaging: Ankle-R DG Complete: Results for orders placed in visit on 10/31/23 DG Ankle Complete Right  Narrative Please see detailed radiograph report in office note.  Ankle-L DG Complete: Results for orders placed in visit on 10/22/22 DG Ankle Complete Left  Narrative X-rays of the left ankle were obtained in clinic today.  There is a single screw in the medial malleolus.  This remains in stable position.  Mortise is congruent.  No syndesmotic disruption.  On the lateral projection, there is a small avulsion fracture of the superior aspect of the navicular.  This has not changed in overall alignment.  There has not been obvious callus formation.  Impression: Stable avulsion fracture of the superior aspect of the navicular  Foot Imaging: Foot-R DG Complete: Results for orders placed in visit on 08/07/23 DG Foot Complete Right  Narrative Please see detailed radiograph report in office note.  Foot-L DG Complete: Results for orders placed in visit on 12/20/22 DG Foot Complete Left  Narrative X-rays left foot  were obtained in clinic today.  No acute injuries noted.  There is a small avulsion fracture of the dorsal aspect of the navicular.  This remains in unchanged position.  No bony lesions.  No additional injuries.  Impression: Stable left navicular avulsion fracture  Complexity Note: Imaging results reviewed.                         Meds   Current Outpatient Medications:    Albuterol -Budesonide  (AIRSUPRA) 90-80 MCG/ACT AERO, , Disp: , Rfl:    amoxicillin -clavulanate (  AUGMENTIN ) 875-125 MG tablet, Take 1 tablet by mouth 2 (two) times daily for 7 days., Disp: 14 tablet, Rfl: 0   Atogepant  (QULIPTA ) 30 MG TABS, Take 1 tablet daily, Disp: 30 tablet, Rfl: 11   busPIRone  (BUSPAR ) 7.5 MG tablet, Take 1 tablet (7.5 mg total) by mouth 2 (two) times daily., Disp: 60 tablet, Rfl: 0   cetirizine  (ZYRTEC ) 10 MG tablet, Take 10 mg by mouth at bedtime., Disp: , Rfl:    cetirizine  (ZYRTEC ) 10 MG tablet, Take 10 mg by mouth., Disp: , Rfl:    clonazePAM  (KLONOPIN ) 0.5 MG tablet, Take 1 tablet (0.5 mg total) by mouth 2 (two) times daily as needed for anxiety., Disp: 60 tablet, Rfl: 2   clonazePAM  (KLONOPIN ) 0.5 MG tablet, Take 0.5 mg by mouth., Disp: , Rfl:    DULoxetine  (CYMBALTA ) 60 MG capsule, Take 2 capsules (120 mg total) by mouth daily., Disp: 180 capsule, Rfl: 0   eletriptan  (RELPAX ) 40 MG tablet, Take 1 tablet at onset of migraine. May repeat in 2 hours if headache persists or recurs. Do not take more than 3 a week, Disp: 10 tablet, Rfl: 5   eletriptan  (RELPAX ) 40 MG tablet, 40 mg., Disp: , Rfl:    empagliflozin  (JARDIANCE ) 25 MG TABS tablet, Take 25 mg by mouth., Disp: , Rfl:    famotidine  (PEPCID ) 40 MG tablet, Take 1 tablet (40 mg total) by mouth 2 (two) times daily., Disp: 180 tablet, Rfl: 0   gabapentin  (NEURONTIN ) 600 MG tablet, Take 1 tablet (600 mg total) by mouth 3 (three) times daily., Disp: 90 tablet, Rfl: 3   GEMTESA 75 MG TABS, Take 1 tablet by mouth daily., Disp: , Rfl:    JARDIANCE  25 MG  TABS tablet, Take 1 tablet (25 mg total) by mouth every morning., Disp: 90 tablet, Rfl: 3   montelukast  (SINGULAIR ) 10 MG tablet, TAKE 1 TABLET BY MOUTH EVERYDAY AT BEDTIME, Disp: 90 tablet, Rfl: 0   montelukast  (SINGULAIR ) 10 MG tablet, Take 10 mg by mouth., Disp: , Rfl:    naloxone  (NARCAN ) nasal spray 4 mg/0.1 mL, Place 1 spray into the nose as needed for up to 365 doses (for opioid-induced respiratory depresssion). In case of emergency (overdose), spray once into each nostril. If no response within 3 minutes, repeat application and call 911., Disp: 1 each, Rfl: 1   ondansetron  (ZOFRAN -ODT) 4 MG disintegrating tablet, Take 2 tablets (8 mg total) by mouth every 8 (eight) hours as needed for nausea or vomiting., Disp: 30 tablet, Rfl: 0   oxyCODONE -acetaminophen  (PERCOCET) 5-325 MG tablet, Take 1 tablet by mouth every 4 (four) hours as needed for up to 7 days for severe pain (pain score 7-10)., Disp: 30 tablet, Rfl: 0   pantoprazole  (PROTONIX ) 40 MG tablet, TAKE 1 TABLET BY MOUTH EVERY DAY IN THE MORNING, Disp: 90 tablet, Rfl: 1   potassium chloride  (MICRO-K ) 10 MEQ CR capsule, TAKE 1 CAPSULE BY MOUTH EVERY DAY, Disp: 90 capsule, Rfl: 3   QUEtiapine  (SEROQUEL ) 100 MG tablet, Take 100 mg by mouth 2 (two) times daily. Take 100 mg by mouth in AM and 200 mg by mouth in PM, Disp: , Rfl:    Semaglutide ,0.25 or 0.5MG /DOS, (OZEMPIC , 0.25 OR 0.5 MG/DOSE,) 2 MG/3ML SOPN, Inject 0.5 mg into the skin once a week., Disp: 3 mL, Rfl: 1   SEMAGLUTIDE ,0.25 OR 0.5MG /DOS, [place], Inject into the skin., Disp: , Rfl:    SYMBICORT 160-4.5 MCG/ACT inhaler, Inhale 2 puffs into the lungs., Disp: , Rfl:  tiZANidine  (ZANAFLEX ) 4 MG tablet, Take 4 mg by mouth every 8 (eight) hours as needed., Disp: , Rfl:    traZODone  (DESYREL ) 150 MG tablet, Take 1 tablet (150 mg total) by mouth at bedtime., Disp: 90 tablet, Rfl: 1   traZODone  (DESYREL ) 150 MG tablet, Take 150 mg by mouth., Disp: , Rfl:    zonisamide  (ZONEGRAN ) 100 MG capsule,  Take 5 capsules every night, Disp: 150 capsule, Rfl: 11   zonisamide  (ZONEGRAN ) 100 MG capsule, 100 mg., Disp: , Rfl:   Allergies  Andrea [name] is allergic to bactrim [sulfamethoxazole -trimethoprim] and codeine.     Interventional Therapies  Risk Factors  Considerations  Medical Comorbidities:  MO (BMI>40)  T2NIDDM  Hx. Seizures  HTN  Hypotension w/ syncope  BA  PTSD  Bipolar Disorder  GAD  Hx. Panic Disorder  schizoaffective disorder  GERD  BNZ Use  Opioid Analgesic Use     Planned  Pending:      Under consideration:   Pending a third surgery to the right ankle by orthopedics.   Completed: (Analgesic benefit)1  None at this time   Therapeutic  Palliative (PRN) options:   None established   Completed by other providers:   None reported  1(Analgesic benefit): Expressed in percentage (%). (Local anesthetic[LA] +/- sedation  L.A.Local Anesthetic  Steroid benefit  Ongoing benefit)  Pharmacotherapy  Prescription sent to the pharmacy for Narcan  since she is taking benzodiazepines and opioids and has increased risk of respiratory depression secondary to a drug to drug interaction.        Note by: Eric DELENA Como, MD (TTS technology used. I apologize for any typographical errors that were not detected and corrected.) Date: 02/09/2024; Time: 6:29 AM

## 2024-02-09 ENCOUNTER — Ambulatory Visit (HOSPITAL_BASED_OUTPATIENT_CLINIC_OR_DEPARTMENT_OTHER): Payer: MEDICAID | Admitting: Pain Medicine

## 2024-02-09 DIAGNOSIS — Z91199 Patient's noncompliance with other medical treatment and regimen due to unspecified reason: Secondary | ICD-10-CM

## 2024-02-09 DIAGNOSIS — G8929 Other chronic pain: Secondary | ICD-10-CM

## 2024-02-09 NOTE — Patient Instructions (Signed)
 ______________________________________________________________________    TENS (Device can be purchased online, without prescription. Search: TENS 7000.) Transcutaneous electrical nerve stimulation (TENS) is a method of pain relief that involves the use of mild electrical stimulation. A TENS machine is a small, battery-operated device that has leads connected to sticky pads called electrodes. Available at [employer]. (Estimated price as of July 10th, 2025.)  (Estimated [employer] cost: $38.88) Rechargeable 9V batteries:  (Estimated [employer] cost: $12.98)  Larger Reusable 2 x 4 TENS Pads/Electrodes:  (Estimated [employer] cost: $9.99)  Total cost: $61.85    ELECTRODE PLACEMENT:   TENS UNIT SAFETY WARNING SHEET and INFORMATION INDICATIONS AND CONTRAINDICTIONS Read the operation manual before using the device. Freight forwarder ([place] ) restricts this device to sale by or on the order of a physician. Observe your physician's precise instructions and let him show you where to apply the electrodes. For a successful therapy, the correct application of the electrodes is an important factor. Carefully write down the settings your physician recommended. Indications for use This device is a prescription device and only for symptomatic relief of chronic intractable pain.  Contraindications:   Any electrode placement that applies current to the carotid sinus (neck) region.   Patients with implanted electronic devices (for example, a pacemaker) or metallic implants should not undertake.   Any electrode placement that causes current to flow transcerebrally (through the head). The use of unit whenever pain symptoms are undiagnosed, unit etiology is determined.   The use of TENS whenever pain syndromes are undiagnosed, until etiology is established.   WARNINGS AND PRECAUTIONS  Warnings:   The device must be kept out of reach of children.   The safety of device for use during pregnancy or delivery has not been established.    Do not place electrodes on front of the throat. This may result in spasms of the laryngeal and pharyngeal muscles.   Do not place the electrodes over the carotid nerve (side of neck below ear).   The device is not effective for pain of central origin (headaches).   The device may interfere with electronic monitoring equipment (such as ECG monitors and ECG alarms).   Electrodes should not be placed over the eyes, in the mouth, or internally.   These devices have no curative value.   TENS devices should be used only under the continued supervision of a physician.   TENS is a symptomatic treatment and as such suppresses the sensation of pain which would otherwise serve as a protective mechanism. Precautions/Adverse Reactions   Isolated cases of skin irritation may occur at the site of electrode placement following long-term application.   Stimulation should be stopped and electrodes removed until the cause of the irritation can be determined.   Effectiveness is highly dependent upon patient selection by a person qualified in the management of pain patients.   If the device treatment becomes ineffective or unpleasant, stimulation should be discontinued until reevaluation by a physician/clinician.   Always turn the device off before applying or removing electrodes.   Skin irritation and electrode burns are potential adverse reactions.  PURPOSE: A Transcutaneous Electrical Nerve Stimulator, or TENS, unit is designed to relieve post-operative, acute and chronic pain. It is used for pain caused by peripheral nerves and not central. TENS units are prescription-only devices.   OPERATION: TENS units work in a couple of ways. The first way they are thought to work is by a method called the [employer]. The [employer] states that our brains can only handle one  stimulus at a time. When you have chronic pain, this pain signal is constantly being sent to your brain and recognized as pain. When an electrical stimulus is added  to the area of pain the body feels this electrical stimulus, and since the brain can only handle one thing at a time, the pain is not transmitted to the brain. The second method thought to be part of TENS unit's success is by way of stimulating our own bodies to release their own natural painkillers. TENS units do not work for everyone and results may vary. Always follow the instructions and warnings in your user's manual.   USE: One of the most important tasks that must be performed is battery maintenance. If you are using a [employer] ge[employer], always fully charge it and fully deplete it before charging it again. These batteries can develop memories and by not performing this charging task correctly, your battery's life can be greatly diminished. If your battery does develop a memory you can help expand the memory by charging for 12 - 13 hours and then completely depleting the battery. Always prepare the skin before applying electrodes. Your skin should be clean and free of any lotions or creams. If you are using electrodes that use conductive gel, apply a small, even layer over the electrode. For carbon, self-adhesive electrodes, apply a drop of water to the electrodes before applying to the skin. The electrodes attach to the lead wires and then the TENS unit. Always grasp the connector and not the cord when inserting or removing. When making adjustments, always make sure the unit's channels (1 and 2) are in the OFF position. The actual settings should be recommended and prescribed by your physician. Medical equipment suppliers don'tset or instruct users as to user settings. When you are using the BURST mode, the unit delivers a series of quick pulses followed by a rest. This cycle repeats itself frequently. Always have channels OFF before changing modes.   For MODULATION mode, the stimulation automatically varies the width of the pulse.   For CONVENTIONAL mode, the stimulation is constant.  After the settings have been fine-tuned, set the timer to 30 or 60 minutes. Your physician should also prescribe the use time. When the lights become dim, it means your batteries should be replaced or recharged.   ACCESSORIES: The electrodes and lead wires can be obtained from your medical equipment supplier. Your medical equipment supplier can set up a recurring delivery to accommodate your needs. Electrodes should be replaced once a month and lead wires once every 6 months.  Video Tutorial [email or web address]://[email or web address]  ______________________________________________________________________      ______________________________________________________________________    Patient information on: Body mass index (BMI) and Weight Management  Dear Andrea [name] you are receiving this information because your weight may be adversely affecting your health.   Your current Estimated body mass index is 39.75 kg/m as calculated from the following:   Height as of 01/19/24: 5' 11 (1.803 m).   Weight as of 01/19/24: 285 lb (129.3 kg).  We recommend you talk to your primary care physician about providing or referring you to a supervised weight management program.  Here is some information about weight and the body mass index (BMI) classification:  BMI is a measure of obesity that's calculated by dividing a person's weight in kilograms by their height in meters squared. A person can use an online calculator to determine their BMI. Body mass index (BMI) is a common tool for deciding whether a person  has an appropriate body weight.  It measures a person's weight in relation to their height.  According to the [facility] of health (NIH): A BMI of less than 18.5 means that a person is underweight. A BMI of between 18.5 and 24.9 is ideal. A BMI of between 25 and 29.9 is overweight. A BMI over 30 indicates obesity.  Body Mass Index (BMI) Classification BMI level (kg/m2) Category  Associated incidence of chronic pain  <18  Underweight   18.5-24.9 Ideal body weight   25-29.9 Overweight  20%  30-34.9 Obese (Class I)  68%  35-39.9 Severe obesity (Class II)  136%  >40 Extreme obesity (Class III)  254%    Morbidly Obese Classification: You will be considered to be Morbidly Obese if your BMI is above 30 and you have one or more of the following conditions caused or associated to obesity: 1.    Type 2 Diabetes (Leading to cardiovascular diseases (CVD), stroke, peripheral vascular diseases (PVD), retinopathy, nephropathy, and neuropathy) 2.    Cardiovascular Disease (High Blood Pressure; Congestive Heart Failure; High Cholesterol; Coronary Artery Disease; Angina; Arrhythmias, Dysrhythmias, or Heart Attacks) 3.    Breathing problems (Asthma; obesity-hypoventilation syndrome; obstructive sleep apnea; chronic inflammatory airway disease; reactive airway disease; or shortness of breath) 4.    Chronic kidney disease 5.    Liver disease (nonalcoholic fatty liver disease) 6.    High blood pressure 7.    Acid reflux (gastroesophageal reflux disease; heartburn) 8.    Osteoarthritis (OA) (affecting the hip(s), the knee(s) and/or the lower back) (usually requiring knee and/or hip replacements, as well as back surgeries) 9.    Low back pain (Lumbar Facet Syndrome; and/or Degenerative Disc Disease) 10.  Hip pain (Osteoarthritis of hip) (For every 1 lbs of added body weight, there is a 2 lbs increase in pressure inside of each hip articulation. 1:2 mechanical relationship) 11.  Knee pain (Osteoarthritis of knee) (For every 1 lbs of added body weight, there is a 4 lbs increase in pressure inside of each knee articulation. 1:4 mechanical relationship) (patients with a BMI>30 kg/m2 were 6.8 times more likely to develop knee OA than normal-weight individuals) 12.  Cancer: Epidemiological studies have shown that obesity is a risk factor for: post-menopausal breast cancer; cancers of the  endometrium, colon and kidney cancer; malignant adenomas of the esophagus. Obese subjects have an approximately 1.5-3.5-fold increased risk of developing these cancers compared with normal-weight subjects, and it has been estimated that between 15 and 45% of these cancers can be attributed to overweight. More recent studies suggest that obesity may also increase the risk of other types of cancer, including pancreatic, hepatic and gallbladder cancer. (Ref: Obesity and cancer. Pischon T, Nthlings U, [employer] H. Proc Nutr Soc. 2008 May;67(2):128-45. doi: 10.1017/S0029665108006976.) The International Agency for Research on Cancer (IARC) has identified 13 cancers associated with overweight and obesity: meningioma, multiple myeloma, adenocarcinoma of the esophagus, and cancers of the thyroid , postmenopausal breast cancer, gallbladder, stomach, liver, pancreas, kidney, ovaries, uterus, colon and rectal (colorectal) cancers. 55 percent of all cancers diagnosed in women and 24 percent of those diagnosed in men are associated with overweight and obesity.  Recommendation: If you have any of the above conditions it is urgent that you take a step back and concentrate in losing weight. Dedicate 100% of your efforts on this task. Nothing else will improve your health more than bringing your weight down and your BMI to less than 30.   Nutritionist and/or supervised weight-management program: We  are aware that most chronic pain patients are unable to exercise secondary to their pain. For this reason, you must rely on proper nutrition and diet in order to lose the weight. We recommend you talk to a nutritionist.   Bariatric surgery: A person might be considered a candidate for bariatric surgery if they meet one of the following BMI criteria:  BMI of 40 or higher: This is considered extreme obesity (Class III). BMI of 35-39.9: This is considered obesity, and the person might also have a serious weight-related health condition,  such as high blood pressure, type 2 diabetes, or severe sleep apnea  BMI of 30-34.9: This might be considered if the person has serious weight-related health problems and hasn't had substantial weight loss or improvement in co-morbidities through other methods   On your own: A realistic goal is to lose 10% of your body weight over a period of 12 months.  If over a period of six (6) months you have unsuccessfully tried to lose weight, then it is time for you to seek professional help and to enter a medically supervised weight management program, and/or undergo bariatric surgery.   Pain management considerations and possible limitations:  1.    Pharmacological Problems: Be advised that the use of opioid analgesics (oxycodone ; hydrocodone ; morphine ; methadone; codeine; and all of their derivatives) have been associated with decreased metabolism and weight gain.  For this reason, should we see that you are unable to lose weight while taking these medications, it may become necessary for us  to taper down and indefinitely discontinue them.  2.    Technical Problems: The incidence of successful interventional therapies decreases as the patient's BMI increases. It is much more difficult to accomplish a safe and effective interventional therapy on a patient with a BMI above 35. 3.    Radiation Exposure Problems: The x-rays machine, used to accomplish injection therapies, will automatically increase their x-ray output in order to capture an appropriate bone image. This means that radiation exposure increases exponentially with the patient's BMI. (The higher the BMI, the higher the radiation exposure.) Although the level of radiation used at a given time is still safe to the patient, it is not for the physician and/or assisting staff. Unfortunately, radiation exposure is accumulative. Because physicians and the staff have to do procedures and be exposed on a daily basis, this can result in health problems such as  cancer and radiation burns. Radiation exposure to the staff is monitored by the radiation batches that they wear. The exposure levels are reported back to the staff on a quarterly basis. Depending on levels of exposure, physicians and staff may be obligated by law to decrease this exposure. This means that they have the right and obligation to refuse providing therapies where they may be overexposed to radiation. For this reason, physicians may decline to offer therapies such as radiofrequency ablation or implants to patients with a BMI above 40. 4.    Current Trends: Be advised that the current trend is to no longer offer certain therapies to patients with a BMI equal to, or above 35, due to increase perioperative risks, increased technical procedural difficulties, and excessive radiation exposure to healthcare personnel.  Last updated: 03/10/2023 ______________________________________________________________________    ______________________________________________________________________    Opioid Pain Medication Update  To: All patients taking opioid pain medications. (I.e.: hydrocodone , hydromorphone , oxycodone , oxymorphone, morphine , codeine, methadone, tapentadol, tramadol , buprenorphine, fentanyl , etc.)  Re: Updated review of side effects and adverse reactions of opioid analgesics, as well  as new information about long term effects of this class of medications.  Direct risks of long-term opioid therapy are not limited to opioid addiction and overdose. Potential medical risks include serious fractures, breathing problems during sleep, hyperalgesia, immunosuppression, chronic constipation, bowel obstruction, myocardial infarction, and tooth decay secondary to xerostomia.  Unpredictable adverse effects that can occur even if you take your medication correctly: Cognitive impairment, respiratory depression, and death. Most people think that if they take their medication correctly, and as  instructed, that they will be safe. Nothing could be farther from the truth. In reality, a significant amount of recorded deaths associated with the use of opioids has occurred in individuals that had taken the medication for a long time, and were taking their medication correctly. The following are examples of how this can happen: Patient taking his/her medication for a long time, as instructed, without any side effects, is given a certain antibiotic or another unrelated medication, which in turn triggers a Drug-to-drug interaction leading to disorientation, cognitive impairment, impaired reflexes, respiratory depression or an untoward event leading to serious bodily harm or injury, including death.  Patient taking his/her medication for a long time, as instructed, without any side effects, develops an acute impairment of liver and/or kidney function. This will lead to a rapid inability of the body to breakdown and eliminate their pain medication, which will result in effects similar to an overdose, but with the same medicine and dose that they had always taken. This again may lead to disorientation, cognitive impairment, impaired reflexes, respiratory depression or an untoward event leading to serious bodily harm or injury, including death.  A similar problem will occur with patients as they grow older and their liver and kidney function begins to decrease as part of the aging process.  Background information: Historically, the original case for using long-term opioid therapy to treat chronic noncancer pain was based on safety assumptions that subsequent experience has called into question. In 1996, the American Pain Society and the American Academy of Pain Medicine issued a consensus statement supporting long-term opioid therapy. This statement acknowledged the dangers of opioid prescribing but concluded that the risk for addiction was low; respiratory depression induced by opioids was short-lived,  occurred mainly in opioid-naive patients, and was antagonized by pain; tolerance was not a common problem; and efforts to control diversion should not constrain opioid prescribing. This has now proven to be wrong. Experience regarding the risks for opioid addiction, misuse, and overdose in community practice has failed to support these assumptions.  According to the Centers for Disease Control and Prevention, fatal overdoses involving opioid analgesics have increased sharply over the past decade. Currently, more than 96,700 people die from drug overdoses every year. Opioids are a factor in 7 out of every 10 overdose deaths. Deaths from drug overdose have surpassed motor vehicle accidents as the leading cause of death for individuals between the ages of 38 and 65.  Clinical data suggest that neuroendocrine dysfunction may be very common in both men and women, potentially causing hypogonadism, erectile dysfunction, infertility, decreased libido, osteoporosis, and depression. Recent studies linked higher opioid dose to increased opioid-related mortality. Controlled observational studies reported that long-term opioid therapy may be associated with increased risk for cardiovascular events. Subsequent meta-analysis concluded that the safety of long-term opioid therapy in elderly patients has not been proven.   Side Effects and adverse reactions: Common side effects: Drowsiness (sedation). Dizziness. Nausea and vomiting. Constipation. Physical dependence -- Dependence often manifests with withdrawal symptoms when opioids  are discontinued or decreased. Tolerance -- As you take repeated doses of opioids, you require increased medication to experience the same effect of pain relief. Respiratory depression -- This can occur in healthy people, especially with higher doses. However, people with COPD, asthma or other lung conditions may be even more susceptible to fatal respiratory impairment.  Uncommon side  effects: An increased sensitivity to feeling pain and extreme response to pain (hyperalgesia). Chronic use of opioids can lead to this. Delayed gastric emptying (the process by which the contents of your stomach are moved into your small intestine). Muscle rigidity. Immune system and hormonal dysfunction. Quick, involuntary muscle jerks (myoclonus). Arrhythmia. Itchy skin (pruritus). Dry mouth (xerostomia).  Long-term side effects: Chronic constipation. Sleep-disordered breathing (SDB). Increased risk of bone fractures. Hypothalamic-pituitary-adrenal dysregulation. Increased risk of overdose.  RISKS: Respiratory depression and death: Opioids increase the risk of respiratory depression and death.  Drug-to-drug interactions: Opioids are relatively contraindicated in combination with benzodiazepines, sleep inducers, and other central nervous system depressants. Other classes of medications (i.e.: certain antibiotics and even over-the-counter medications) may also trigger or induce respiratory depression in some patients.  Medical conditions: Patients with pre-existing respiratory problems are at higher risk of respiratory failure and/or depression when in combination with opioid analgesics. Opioids are relatively contraindicated in some medical conditions such as central sleep apnea.   Fractures and Falls:  Opioids increase the risk and incidence of falls. This is of particular importance in elderly patients.  Endocrine System:  Long-term administration is associated with endocrine abnormalities (endocrinopathies). (Also known as Opioid-induced Endocrinopathy) Influences on both the hypothalamic-pituitary-adrenal axis?and the hypothalamic-pituitary-gonadal axis have been demonstrated with consequent hypogonadism and adrenal insufficiency in both sexes. Hypogonadism and decreased levels of dehydroepiandrosterone sulfate have been reported in men and women. Endocrine effects  include: Amenorrhoea in women (abnormal absence of menstruation) Reduced libido in both sexes Decreased sexual function Erectile dysfunction in men Hypogonadisms (decreased testicular function with shrinkage of testicles) Infertility Depression and fatigue Loss of muscle mass Anxiety Depression Immune suppression Hyperalgesia Weight gain Anemia Osteoporosis Patients (particularly women of childbearing age) should avoid opioids. There is insufficient evidence to recommend routine monitoring of asymptomatic patients taking opioids in the long-term for hormonal deficiencies.  Immune System: Human studies have demonstrated that opioids have an immunomodulating effect. These effects are mediated via opioid receptors both on immune effector cells and in the central nervous system. Opioids have been demonstrated to have adverse effects on antimicrobial response and anti-tumour surveillance. Buprenorphine has been demonstrated to have no impact on immune function.  Opioid Induced Hyperalgesia: Human studies have demonstrated that prolonged use of opioids can lead to a state of abnormal pain sensitivity, sometimes called opioid induced hyperalgesia (OIH). Opioid induced hyperalgesia is not usually seen in the absence of tolerance to opioid analgesia. Clinically, hyperalgesia may be diagnosed if the patient on long-term opioid therapy presents with increased pain. This might be qualitatively and anatomically distinct from pain related to disease progression or to breakthrough pain resulting from development of opioid tolerance. Pain associated with hyperalgesia tends to be more diffuse than the pre-existing pain and less defined in quality. Management of opioid induced hyperalgesia requires opioid dose reduction.  Cancer: Chronic opioid therapy has been associated with an increased risk of cancer among noncancer patients with chronic pain. This association was more evident in chronic strong  opioid users. Chronic opioid consumption causes significant pathological changes in the small intestine and colon. Epidemiological studies have found that there is a link between opium  dependence  and initiation of gastrointestinal cancers. Cancer is the second leading cause of death after cardiovascular disease. Chronic use of opioids can cause multiple conditions such as GERD, immunosuppression and renal damage as well as carcinogenic effects, which are associated with the incidence of cancers.   Mortality: Long-term opioid use has been associated with increased mortality among patients with chronic non-cancer pain (CNCP).  Prescription of long-acting opioids for chronic noncancer pain was associated with a significantly increased risk of all-cause mortality, including deaths from causes other than overdose.  Reference: Von Korff M, Kolodny A, Deyo RA, Chou R. Long-term opioid therapy reconsidered. Ann Intern Med. 2011 Sep 6;155(5):325-8. doi: 10.7326/[id number]. PMID: 78106373; PMCID: EFR6719914. Kit JINNY Laurence CINDERELLA Pearley JINNY, Hayward RA, Dunn KM, [place] KP. Risk of adverse events in patients prescribed long-term opioids: A cohort study in the [employer]. Eur J Pain. 2019 May;23(5):908-922. doi: 10.1002/ejp.1357. Epub 2019 Jan 31. PMID: 69379883. Colameco S, Coren JS, Ciervo CA. Continuous opioid treatment for chronic noncancer pain: a time for moderation in prescribing. Postgrad Med. 2009 Jul;121(4):61-6. doi: 10.3810/pgm.2009.07.2032. PMID: 80358728. Gigi JONELLE Shlomo MILUS Levern IVER Conny RN, Bigelow SD, Blazina I, Lonell DASEN, Bougatsos C, Deyo RA. The effectiveness and risks of long-term opioid therapy for chronic pain: a systematic review for a [employer] to [employer]. Ann Intern Med. 2015 Feb 17;162(4):276-86. doi: 10.7326/M14-2559. PMID: 74418742. Rory CHRISTELLA [facility] Makuc DM. NCHS Data Brief No. 22. [place]: Centers  for Disease Control and Prevention; 2009. Sep, Increase in Fatal Poisonings Involving Opioid Analgesics in the [place] , 1999-2006. Song IA, Choi HR, Oh TK. Long-term opioid use and mortality in patients with chronic non-cancer pain: Ten-year follow-up study in [place] from 2010 through 2019. EClinicalMedicine. 2022 Jul 18;51:101558. doi: 10.1016/j.eclinm.2022.898441. PMID: 64124182; PMCID: EFR0695089. Huser, W., Schubert, T., Vogelmann, T. et al. All-cause mortality in patients with long-term opioid therapy compared with non-opioid analgesics for chronic non-cancer pain: a database study. BMC Med 18, 162 (2020). http://lester.info/ Rashidian H, Zendehdel K, Kamangar F, Malekzadeh R, Haghdoost AA. An Ecological Study of the Association between Opiate Use and Incidence of Cancers. Addict Health. 2016 Fall;8(4):252-260. PMID: 71180443; PMCID: EFR4445194.  Our Goal: Our goal is to control your pain with means other than the use of opioid pain medications.  Our Recommendation: Talk to your physician about coming off of these medications. We can assist you with the tapering down and stopping these medicines. Based on the new information, even if you cannot completely stop the medication, a decrease in the dose may be associated with a lesser risk. Ask for other means of controlling the pain. Decrease or eliminate those factors that significantly contribute to your pain such as smoking, obesity, and a diet heavily tilted towards inflammatory nutrients.  Last Updated: 12/23/2022   ______________________________________________________________________

## 2024-02-10 ENCOUNTER — Other Ambulatory Visit: Payer: Self-pay | Admitting: Podiatry

## 2024-02-10 ENCOUNTER — Encounter: Payer: Self-pay | Admitting: Podiatry

## 2024-02-10 MED ORDER — OXYCODONE-ACETAMINOPHEN 10-325 MG PO TABS
1.0000 | ORAL_TABLET | ORAL | 0 refills | Status: AC | PRN
Start: 2024-02-10 — End: ?

## 2024-02-10 NOTE — Progress Notes (Signed)
 Refill sent.

## 2024-02-19 ENCOUNTER — Encounter: Payer: Self-pay | Admitting: Podiatry

## 2024-02-19 ENCOUNTER — Other Ambulatory Visit: Payer: Self-pay | Admitting: Podiatry

## 2024-02-19 MED ORDER — OXYCODONE-ACETAMINOPHEN 10-325 MG PO TABS
1.0000 | ORAL_TABLET | ORAL | 0 refills | Status: DC | PRN
Start: 2024-02-19 — End: 2024-04-12

## 2024-02-19 NOTE — Telephone Encounter (Signed)
Routed to correct provider

## 2024-03-02 ENCOUNTER — Encounter: Payer: MEDICAID | Admitting: Nurse Practitioner

## 2024-03-04 ENCOUNTER — Telehealth: Payer: Self-pay | Admitting: Nurse Practitioner

## 2024-03-04 NOTE — Telephone Encounter (Signed)
 Copied from CRM [id number]. Topic: Medical Record Request - Provider/Facility Request >> Mar 04, 2024 11:39 AM [place] J wrote: Reason for CRM: Nathanel calling form [facility] requesting the patient's most recent office visits notes proving her urinary incontinence so they can proceed with providing the appropriate supplies.  Please fax to: [phone]

## 2024-03-08 NOTE — Telephone Encounter (Signed)
 [facility] said the Patient needs xl diapers and chux or bed pads.

## 2024-03-10 NOTE — Telephone Encounter (Signed)
 Patient is scheduled on 03/11/24

## 2024-03-11 ENCOUNTER — Telehealth: Payer: MEDICAID | Admitting: Nurse Practitioner

## 2024-03-11 ENCOUNTER — Encounter: Payer: Self-pay | Admitting: Nurse Practitioner

## 2024-03-11 VITALS — BP 157/127 | HR 79 | Temp 98.7°F | Ht 71.0 in | Wt 285.0 lb

## 2024-03-11 DIAGNOSIS — R32 Unspecified urinary incontinence: Secondary | ICD-10-CM | POA: Diagnosis not present

## 2024-03-11 NOTE — Progress Notes (Unsigned)
 Virtual Visit via Video Note  I connected with Andrea [name] on 03/11/24 at 3:54PM by a video enabled telemedicine application and verified that I am speaking with the correct person using two identifiers.  Patient Location: Home Provider Location: Office/Clinic  I discussed the limitations, risks, security, and privacy concerns of performing an evaluation and management service by video and the availability of in person appointments. I also discussed with the patient that there may be a patient responsible charge related to this service. The patient expressed understanding and agreed to proceed.  Subjective: PCP: Vincente Saber, NP  Chief Complaint  Patient presents with   Acute Visit    Urinary incontinence   HPI  Andrea [name] is a 43 year old female who presents with ongoing ankle pain post-surgery and urinary incontinence.  She has persistent ankle pain following recent surgery, which has not improved. She visited the emergency department earlier today due to this issue, though details of the visit are not provided.  She experiences urinary incontinence and has received a letter regarding urinary supplies. She requires supplies such as chucks for the bed and pull-ups.  ROS: Per HPI  Current Outpatient Medications:    oxyCODONE -acetaminophen  (PERCOCET) 10-325 MG tablet, Take 1 tablet by mouth every 4 (four) hours as needed for pain., Disp: 30 tablet, Rfl: 0   Albuterol -Budesonide  (AIRSUPRA) 90-80 MCG/ACT AERO, , Disp: , Rfl:    Atogepant  (QULIPTA ) 30 MG TABS, Take 1 tablet daily, Disp: 30 tablet, Rfl: 11   busPIRone  (BUSPAR ) 7.5 MG tablet, Take 1 tablet (7.5 mg total) by mouth 2 (two) times daily., Disp: 60 tablet, Rfl: 0   cetirizine  (ZYRTEC ) 10 MG tablet, Take 10 mg by mouth at bedtime., Disp: , Rfl:    cetirizine  (ZYRTEC ) 10 MG tablet, Take 10 mg by mouth., Disp: , Rfl:    clonazePAM  (KLONOPIN ) 0.5 MG tablet, Take 1 tablet (0.5 mg total) by mouth 2 (two) times  daily as needed for anxiety., Disp: 60 tablet, Rfl: 2   clonazePAM  (KLONOPIN ) 0.5 MG tablet, Take 0.5 mg by mouth., Disp: , Rfl:    DULoxetine  (CYMBALTA ) 60 MG capsule, Take 2 capsules (120 mg total) by mouth daily., Disp: 180 capsule, Rfl: 0   eletriptan  (RELPAX ) 40 MG tablet, Take 1 tablet at onset of migraine. May repeat in 2 hours if headache persists or recurs. Do not take more than 3 a week, Disp: 10 tablet, Rfl: 5   eletriptan  (RELPAX ) 40 MG tablet, 40 mg., Disp: , Rfl:    empagliflozin  (JARDIANCE ) 25 MG TABS tablet, Take 25 mg by mouth., Disp: , Rfl:    famotidine  (PEPCID ) 40 MG tablet, Take 1 tablet (40 mg total) by mouth 2 (two) times daily., Disp: 180 tablet, Rfl: 0   gabapentin  (NEURONTIN ) 600 MG tablet, Take 1 tablet (600 mg total) by mouth 3 (three) times daily., Disp: 90 tablet, Rfl: 3   GEMTESA 75 MG TABS, Take 1 tablet by mouth daily., Disp: , Rfl:    JARDIANCE  25 MG TABS tablet, Take 1 tablet (25 mg total) by mouth every morning., Disp: 90 tablet, Rfl: 3   montelukast  (SINGULAIR ) 10 MG tablet, TAKE 1 TABLET BY MOUTH EVERYDAY AT BEDTIME, Disp: 90 tablet, Rfl: 0   montelukast  (SINGULAIR ) 10 MG tablet, Take 10 mg by mouth., Disp: , Rfl:    naloxone  (NARCAN ) nasal spray 4 mg/0.1 mL, Place 1 spray into the nose as needed for up to 365 doses (for opioid-induced respiratory depresssion). In case of  emergency (overdose), spray once into each nostril. If no response within 3 minutes, repeat application and call 911., Disp: 1 each, Rfl: 1   ondansetron  (ZOFRAN -ODT) 4 MG disintegrating tablet, Take 2 tablets (8 mg total) by mouth every 8 (eight) hours as needed for nausea or vomiting., Disp: 30 tablet, Rfl: 0   oxyCODONE -acetaminophen  (PERCOCET) 10-325 MG tablet, Take 1 tablet by mouth every 4 (four) hours as needed for pain., Disp: 30 tablet, Rfl: 0   pantoprazole  (PROTONIX ) 40 MG tablet, TAKE 1 TABLET BY MOUTH EVERY DAY IN THE MORNING, Disp: 90 tablet, Rfl: 1   potassium chloride  (MICRO-K )  10 MEQ CR capsule, TAKE 1 CAPSULE BY MOUTH EVERY DAY, Disp: 90 capsule, Rfl: 3   QUEtiapine  (SEROQUEL ) 100 MG tablet, Take 100 mg by mouth 2 (two) times daily. Take 100 mg by mouth in AM and 200 mg by mouth in PM, Disp: , Rfl:    Semaglutide ,0.25 or 0.5MG /DOS, (OZEMPIC , 0.25 OR 0.5 MG/DOSE,) 2 MG/3ML SOPN, Inject 0.5 mg into the skin once a week., Disp: 3 mL, Rfl: 1   SEMAGLUTIDE ,0.25 OR 0.5MG /DOS, [place], Inject into the skin., Disp: , Rfl:    SYMBICORT 160-4.5 MCG/ACT inhaler, Inhale 2 puffs into the lungs., Disp: , Rfl:    tiZANidine  (ZANAFLEX ) 4 MG tablet, Take 4 mg by mouth every 8 (eight) hours as needed., Disp: , Rfl:    traZODone  (DESYREL ) 150 MG tablet, Take 1 tablet (150 mg total) by mouth at bedtime., Disp: 90 tablet, Rfl: 1   traZODone  (DESYREL ) 150 MG tablet, Take 150 mg by mouth., Disp: , Rfl:    zonisamide  (ZONEGRAN ) 100 MG capsule, Take 5 capsules every night, Disp: 150 capsule, Rfl: 11   zonisamide  (ZONEGRAN ) 100 MG capsule, 100 mg., Disp: , Rfl:   Observations/Objective: Today's Vitals   03/11/24 1551  BP: (!) 157/127  Pulse: 79  Temp: 98.7 F (37.1 C)  Weight: 285 lb (129.3 kg)  Height: 5' 11 (1.803 m)   Physical Exam  Assessment and Plan: There are no diagnoses linked to this encounter.  Follow Up Instructions: No follow-ups on file.   I discussed the assessment and treatment plan with the patient. The patient was provided an opportunity to ask questions, and all were answered. The patient agreed with the plan and demonstrated an understanding of the instructions.   The patient was advised to call back or seek an in-person evaluation if the symptoms worsen or if the condition fails to improve as anticipated.  The above assessment and management plan was discussed with the patient. The patient verbalized understanding of and has agreed to the management plan.   Cortina Vultaggio, NP

## 2024-03-12 ENCOUNTER — Encounter: Payer: Self-pay | Admitting: Emergency Medicine

## 2024-03-12 ENCOUNTER — Ambulatory Visit
Admission: EM | Admit: 2024-03-12 | Discharge: 2024-03-12 | Disposition: A | Discharge status: Home / Self Care | Payer: MEDICAID | Attending: Emergency Medicine | Admitting: Emergency Medicine

## 2024-03-12 ENCOUNTER — Encounter: Payer: Self-pay | Admitting: Nurse Practitioner

## 2024-03-12 DIAGNOSIS — T8141XA Infection following a procedure, superficial incisional surgical site, initial encounter: Secondary | ICD-10-CM | POA: Diagnosis not present

## 2024-03-12 MED ORDER — OXYCODONE HCL 5 MG PO CAPS: 5.0000 mg | ORAL_CAPSULE | Freq: Four times a day (QID) | ORAL | 0 refills | Status: DC | PRN

## 2024-03-12 MED ORDER — METOCLOPRAMIDE HCL 10 MG PO TABS: 10.0000 mg | ORAL_TABLET | Freq: Four times a day (QID) | ORAL | 0 refills | Status: DC

## 2024-03-12 NOTE — ED Triage Notes (Signed)
 Patient reports right leg pain and discharge x 2 days.

## 2024-03-12 NOTE — Discharge Instructions (Addendum)
 Today you are evaluated for your leg pain  As you were started on antibiotics yesterday I do want you to continue taking as directed because it takes 2 to 3 days for antibiotics to fully take effect within the body however if you are still seeing puslike drainage on Sunday please notify the clinic and I will change your antibiotic  You have been been prescribed oxycodone  for pain, may use every 6 hours as needed but please be mindful this can make you drowsy, may take with Tylenol  and or Motrin   You may use Reglan  every 6 hours as needed for nausea and vomiting  Please continue all wound care as directed  On Monday please continue to reach out to your surgeon  If symptoms do not improve after use of second antibiotic then you will need to go to the emergency department for IV antibiotics

## 2024-03-13 NOTE — ED Provider Notes (Signed)
 Andrea [name]    CSN: [id number] Arrival date & time: 03/12/24  1940      History   Chief Complaint Chief Complaint  Patient presents with   Leg Pain    HPI Andrea [name] is a 43 y.o. female.   Patient presents for evaluation of purulent drainage and pain to the right lower extremity and ankle beginning 3 days ago.  Had surgery to the right ankle due to a fracture and currently has pins in place.  Noticed purulent drainage to the upper wounds, endorses daily wound care as directed.  Was seen in the emergency department 1 day prior and was prescribed antibiotics, endorses no improvement in that pain is severe throbbing making it difficult to sleep.  Past Medical History:  Diagnosis Date   Allergy    Anemia    Anxiety    Arthritis    Asthma    Bipolar 1 disorder (HCC)    Bipolar 1 disorder, mixed, severe (HCC) 12/20/2016   Bipolar I disorder, most recent episode depressed (HCC) 12/20/2016   Complication of anesthesia    hard time waking up    Depression    Diabetes mellitus without complication (HCC)    Drug induced akathisia 04/02/2022   GERD (gastroesophageal reflux disease)    no meds   Hypertension    Kidney stones    Long term current use of antipsychotic medication 03/13/2022   Low back pain    Migraines    Neuromuscular disorder (HCC)    PCOS (polycystic ovarian syndrome)    PONV (postoperative nausea and vomiting)    Schizophrenia (HCC)    Seizure (HCC) 10/03/2021   Seizures (HCC) 06/04/2016   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[facility] 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   Shortness of breath dyspnea    with bronchitis    Patient Active Problem List   Diagnosis Date Noted   Urinary incontinence 03/11/2024   UTI symptoms 01/26/2024   Long term prescription benzodiazepine use 01/19/2024   Long-term current use of opiate analgesic 01/19/2024   Chronic prescription opiate use 01/19/2024   Closed ankle fracture, sequela (Right) 01/19/2024   Chronic postoperative pain  of ankle (Right) 01/19/2024   Chronic pain syndrome 01/18/2024   Pharmacologic therapy 01/18/2024   Disorder of skeletal system 01/18/2024   Problems influencing health status 01/18/2024   Delayed union of ankle fracture, closed (Right) 01/14/2024   Fall at home, sequela 01/14/2024   Neuropathic ulcer of heel, limited to breakdown of skin (HCC) (Right) 01/14/2024   Elevated blood pressure reading 12/26/2023   Schizoaffective disorder, bipolar type (HCC) 12/05/2023   Diabetic nephropathy associated with type 2 diabetes mellitus (HCC) 07/23/2023   Acute right-sided low back pain without sciatica 07/18/2023   Acute flank pain (Right) 07/18/2023   Fluctuating blood pressure 07/18/2023   Hypotension 07/02/2023   Occasional tremors 07/02/2023   Constipation 07/02/2023   Syncope due to orthostatic hypotension 06/20/2023   Closed trimalleolar fracture of ankle, sequela (Right) 05/20/2023   Bipolar affective disorder, mixed, severe, with psychotic behavior (HCC) 05/20/2023   Panic disorder 05/20/2023   History of removal of internal fixation device 05/20/2023   Moderate recurrent major depression (HCC) 05/20/2023   Primary localized osteoarthrosis of ankle and foot 05/20/2023   Schizophrenia (HCC) 05/20/2023   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) 05/20/2023   Complication of procedure 05/20/2023   Acquired varus deformity of ankle (Right) 05/13/2023   Postoperative surgical complication involving musculoskeletal system associated with musculoskeletal  procedure 05/12/2023   Bipolar 1 disorder (HCC) 05/03/2023   Type 2 diabetes mellitus with peripheral neuropathy (HCC) 05/03/2023   Essential hypertension 05/03/2023   Mild persistent asthma 05/03/2023   Pre-syncope 05/03/2023   Chronic ankle pain (1ry area of Pain) (Right) 07/16/2022   Chronic post-traumatic stress disorder 03/13/2022   Generalized anxiety disorder with panic attacks 03/13/2022   Major depressive  disorder, 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 03/13/2022   Other insomnia 03/13/2022   Long term current use of antipsychotic medication 03/13/2022   Convulsion (HCC) 11/26/2021   HTN (hypertension) 09/18/2016   Acute pyelonephritis 01/16/2013   Migraines 10/30/2012   Obesity, Class III, BMI 40-49.9 (morbid obesity) (HCC) 10/30/2012   Kidney stones     Past Surgical History:  Procedure Laterality Date   ABDOMINAL HYSTERECTOMY N/A 11/14/2015   Procedure: HYSTERECTOMY ABDOMINAL;  Surgeon: Oneil FORBES Piety, MD;  Location: [facility];  Service: Gynecology;  Laterality: N/A;   ANKLE FUSION Right 05/13/2023   Procedure: ARTHRODESIS ANKLE;  Surgeon: Malvin Marsa FALCON, DPM;  Location: [facility];  Service: Orthopedics/Podiatry;  Laterality: Right;  Hardware removal, right ankle tibiot talo calcaneal arthrodesis   CHOLECYSTECTOMY     FRACTURE SURGERY     INTRAUTERINE DEVICE (IUD) INSERTION  06/14/2014   [facility]   LYMPH NODES REMOVED     ORIF ANKLE FRACTURE Right 05/03/2023   Procedure: OPEN REDUCTION INTERNAL FIXATION (ORIF) ANKLE FRACTURE;  Surgeon: Silva Juliene SAUNDERS, DPM;  Location: [facility];  Service: Orthopedics/Podiatry;  Laterality: Right;   ovarian cyst removed     SALPINGOOPHORECTOMY Bilateral 11/14/2015   Procedure: SALPINGO OOPHORECTOMY;  Surgeon: Oneil FORBES Piety, MD;  Location: [facility];  Service: Gynecology;  Laterality: Bilateral;    OB History   No obstetric history on file.      Home Medications    Prior to Admission medications   Medication Sig Start Date End Date Taking? Authorizing Provider  metoCLOPramide  (REGLAN ) 10 MG tablet Take 1 tablet (10 mg total) by mouth every 6 (six) hours. 03/12/24  Yes Leeroy Lovings R, NP  oxycodone  (OXY-IR) 5 MG capsule Take 1 capsule (5 mg total) by mouth every 6 (six) hours as needed for up to 5 days. 03/12/24 03/17/24 Yes Lessie Manigo,  Shelba SAUNDERS, NP  Albuterol -Budesonide  (AIRSUPRA) 90-80 MCG/ACT AERO  04/01/23   [provider]  Atogepant  (QULIPTA ) 30 MG TABS Take 1 tablet daily 01/12/24   Georjean Darice HERO, MD  busPIRone  (BUSPAR ) 7.5 MG tablet Take 1 tablet (7.5 mg total) by mouth 2 (two) times daily. 12/05/23   Gretel App, NP  cetirizine  (ZYRTEC ) 10 MG tablet Take 10 mg by mouth at bedtime. 11/05/23   [provider]  cetirizine  (ZYRTEC ) 10 MG tablet Take 10 mg by mouth. 11/05/23   [provider]  clonazePAM  (KLONOPIN ) 0.5 MG tablet Take 1 tablet (0.5 mg total) by mouth 2 (two) times daily as needed for anxiety. 01/16/24   Gretel App, NP  clonazePAM  (KLONOPIN ) 0.5 MG tablet Take 0.5 mg by mouth. 12/26/23   [provider]  DULoxetine  (CYMBALTA ) 60 MG capsule Take 2 capsules (120 mg total) by mouth daily. 10/13/23   Barbra Jayson LABOR, MD  eletriptan  (RELPAX ) 40 MG tablet Take 1 tablet at onset of migraine. May repeat in 2 hours if headache persists or recurs. Do not take more than 3 a week 01/12/24   Georjean Darice HERO, MD  eletriptan  (RELPAX ) 40  MG tablet 40 mg. 07/23/23   [provider]  empagliflozin  (JARDIANCE ) 25 MG TABS tablet Take 25 mg by mouth. 05/21/23   [provider]  famotidine  (PEPCID ) 40 MG tablet Take 1 tablet (40 mg total) by mouth 2 (two) times daily. 07/28/23   Kaur, Charanpreet, NP  gabapentin  (NEURONTIN ) 600 MG tablet Take 1 tablet (600 mg total) by mouth 3 (three) times daily. 12/09/23   Standiford, Alexander F, DPM  GEMTESA 75 MG TABS Take 1 tablet by mouth daily. 01/02/23   [provider]  JARDIANCE  25 MG TABS tablet Take 1 tablet (25 mg total) by mouth every morning. 10/13/23   Kaur, Charanpreet, NP  montelukast  (SINGULAIR ) 10 MG tablet TAKE 1 TABLET BY MOUTH EVERYDAY AT BEDTIME 12/29/23   Kaur, Charanpreet, NP  montelukast  (SINGULAIR ) 10 MG tablet Take 10 mg by mouth. 05/21/23   [provider]  naloxone  (NARCAN ) nasal spray 4 mg/0.1 mL Place 1  spray into the nose as needed for up to 365 doses (for opioid-induced respiratory depresssion). In case of emergency (overdose), spray once into each nostril. If no response within 3 minutes, repeat application and call 911. 01/19/24 01/18/25  Tanya Glisson, MD  ondansetron  (ZOFRAN -ODT) 4 MG disintegrating tablet Take 2 tablets (8 mg total) by mouth every 8 (eight) hours as needed for nausea or vomiting. 09/06/23   Corlis Burnard DEL, NP  oxyCODONE -acetaminophen  (PERCOCET) 10-325 MG tablet Take 1 tablet by mouth every 4 (four) hours as needed for pain. 02/10/24   Standiford, Marsa FALCON, DPM  oxyCODONE -acetaminophen  (PERCOCET) 10-325 MG tablet Take 1 tablet by mouth every 4 (four) hours as needed for pain. 02/19/24   Standiford, Marsa FALCON, DPM  pantoprazole  (PROTONIX ) 40 MG tablet TAKE 1 TABLET BY MOUTH EVERY DAY IN THE MORNING 01/16/24   Kaur, Charanpreet, NP  potassium chloride  (MICRO-K ) 10 MEQ CR capsule TAKE 1 CAPSULE BY MOUTH EVERY DAY 01/21/24   Gretel App, NP  QUEtiapine  (SEROQUEL ) 100 MG tablet Take 100 mg by mouth 2 (two) times daily. Take 100 mg by mouth in AM and 200 mg by mouth in PM 01/15/24   [provider]  Semaglutide ,0.25 or 0.5MG /DOS, (OZEMPIC , 0.25 OR 0.5 MG/DOSE,) 2 MG/3ML SOPN Inject 0.5 mg into the skin once a week. 01/16/24   Gretel App, NP  SEMAGLUTIDE ,0.25 OR 0.5MG /DOS, [place] Inject into the skin.    [provider]  SYMBICORT 160-4.5 MCG/ACT inhaler Inhale 2 puffs into the lungs. 08/13/22   [provider]  tiZANidine  (ZANAFLEX ) 4 MG tablet Take 4 mg by mouth every 8 (eight) hours as needed. 07/28/23   [provider]  traZODone  (DESYREL ) 150 MG tablet Take 1 tablet (150 mg total) by mouth at bedtime. 09/04/23   Kaur, Charanpreet, NP  traZODone  (DESYREL ) 150 MG tablet Take 150 mg by mouth.    [provider]  zonisamide  (ZONEGRAN ) 100 MG capsule Take 5 capsules every night 01/12/24   Georjean Darice HERO, MD  zonisamide  (ZONEGRAN ) 100 MG capsule 100  mg. 12/06/22   [provider]    Family History Family History  Problem Relation Age of Onset   Miscarriages / Stillbirths Mother    Hypertension Mother    Heart disease Mother    Hypertension Father    Healthy Sister    Depression Maternal Aunt    Depression Maternal Aunt    Diabetes Maternal Aunt    Breast cancer Maternal Grandmother    Healthy Brother     Social History Social History  Tobacco Use   Smoking status: Never   Smokeless tobacco: Never  Vaping Use   Vaping status: Never Used  Substance Use Topics   Alcohol  use: Never   Drug use: Never     Allergies   Bactrim [sulfamethoxazole -trimethoprim] and Codeine   Review of Systems Review of Systems   Physical Exam Triage Vital Signs ED Triage Vitals  Encounter Vitals Group     BP 03/12/24 1955 129/77     Girls Systolic BP Percentile --      Girls Diastolic BP Percentile --      Boys Systolic BP Percentile --      Boys Diastolic BP Percentile --      Pulse Rate 03/12/24 1955 77     Resp 03/12/24 1955 18     Temp 03/12/24 1955 98.2 F (36.8 C)     Temp Source 03/12/24 1955 Oral     SpO2 03/12/24 1955 99 %     Weight --      Height --      Head Circumference --      Peak Flow --      Pain Score 03/12/24 1957 8     Pain Loc --      Pain Education --      Exclude from Growth Chart --    No data found.  Updated Vital Signs BP 129/77 (BP Location: Left Arm)   Pulse 77   Temp 98.2 F (36.8 C) (Oral)   Resp 18   LMP  (LMP Unknown)   SpO2 99%   Visual Acuity Right Eye Distance:   Left Eye Distance:   Bilateral Distance:    Right Eye Near:   Left Eye Near:    Bilateral Near:     Physical Exam Constitutional:      Appearance: Normal appearance.  Eyes:     Extraocular Movements: Extraocular movements intact.  Pulmonary:     Effort: Pulmonary effort is normal.  Musculoskeletal:     Comments: Erythematous right lower extremity with mild purulent drainage at the site of a  tibial hardware placement  Neurological:     Mental Status: She is alert and oriented to person, place, and time. Mental status is at baseline.      UC Treatments / Results  Labs (all labs ordered are listed, but only abnormal results are displayed) Labs Reviewed - No data to display  EKG   Radiology No results found.  Procedures Procedures (including critical care time)  Medications Ordered in UC Medications - No data to display  Initial Impression / Assessment and Plan / UC Course  I have reviewed the triage vital signs and the nursing notes.  Pertinent labs & imaging results that were available during my care of the patient were reviewed by me and considered in my medical decision making (see chart for details).  Infection of superficial incisional surgical site after procedure  Seen 1 day prior in the emergency department, imaging obtained, stable, has been taking antibiotics less than 24 hours therefore at this time we will defer change to medication, was prescribed cephalexin  and doxycycline , discussed this with patient, prescribed oxycodone  and Reglan  for management of pain which has caused episodes of vomiting, endorses that she has attempted to reach her surgeon several times but was unsuccessful, strongly advised her to continue to do so, if no improvement in symptoms by day 3 of the treatment patient to notify clinic for change in antibiotic, no signs of sepsis at this  time Final Clinical Impressions(s) / UC Diagnoses   Final diagnoses:  Infection of superficial incisional surgical site after procedure, initial encounter     Discharge Instructions      Today you are evaluated for your leg pain  As you were started on antibiotics yesterday I do want you to continue taking as directed because it takes 2 to 3 days for antibiotics to fully take effect within the body however if you are still seeing puslike drainage on Sunday please notify the clinic and I will  change your antibiotic  You have been been prescribed oxycodone  for pain, may use every 6 hours as needed but please be mindful this can make you drowsy, may take with Tylenol  and or Motrin   You may use Reglan  every 6 hours as needed for nausea and vomiting  Please continue all wound care as directed  On Monday please continue to reach out to your surgeon  If symptoms do not improve after use of second antibiotic then you will need to go to the emergency department for IV antibiotics   ED Prescriptions     Medication Sig Dispense Auth. Provider   oxycodone  (OXY-IR) 5 MG capsule Take 1 capsule (5 mg total) by mouth every 6 (six) hours as needed for up to 5 days. 20 capsule Elner Seifert R, NP   metoCLOPramide  (REGLAN ) 10 MG tablet Take 1 tablet (10 mg total) by mouth every 6 (six) hours. 20 tablet Lavar Rosenzweig R, NP      I have reviewed the PDMP during this encounter.   Teresa Shelba SAUNDERS, [place] 03/13/24 [phone]

## 2024-03-14 ENCOUNTER — Telehealth: Payer: Self-pay | Admitting: Emergency Medicine

## 2024-03-14 MED ORDER — LEVOFLOXACIN 500 MG PO TABS: 500.0000 mg | ORAL_TABLET | Freq: Every day | ORAL | 0 refills | Status: DC

## 2024-03-14 NOTE — Telephone Encounter (Signed)
 Patient was evaluated 2 days ago by this provider, had been seen in the emergency department 3 days ago and started on cephalexin  and doxycycline , at the time of visit had not been taking antibiotics more than 24 hours and therefore deferred change in medicine, did discuss with patient to notify the clinic if no improvement in her symptoms after day 3 of use which she did so today and therefore Levaquin  sent to the pharmacy, advised to notify surgeon of condition tomorrow and that if no improvement seen with use of Levaquin  after 72 hours she is to go to the nearest emergency department for IV medication and further management

## 2024-03-15 ENCOUNTER — Telehealth: Payer: MEDICAID | Admitting: Pain Medicine

## 2024-03-16 ENCOUNTER — Encounter: Payer: Self-pay | Admitting: Nurse Practitioner

## 2024-03-16 ENCOUNTER — Telehealth: Payer: Self-pay | Admitting: Nurse Practitioner

## 2024-03-16 NOTE — Telephone Encounter (Signed)
 Copied from CRM [id number]. Topic: Medical Record Request - Provider/Facility Request >> Mar 04, 2024 11:39 AM [place] J wrote: Reason for CRM: Nathanel calling form [facility] requesting the patient's most recent office visits notes proving her urinary incontinence so they can proceed with providing the appropriate supplies.  Please fax to: [phone]

## 2024-03-16 NOTE — Telephone Encounter (Signed)
 Faxed office notes to [facility] and received an okay confirmation.

## 2024-03-18 ENCOUNTER — Other Ambulatory Visit: Payer: Self-pay | Admitting: Nurse Practitioner

## 2024-03-18 ENCOUNTER — Ambulatory Visit: Payer: MEDICAID | Admitting: Orthopedic Surgery

## 2024-03-18 DIAGNOSIS — F250 Schizoaffective disorder, bipolar type: Secondary | ICD-10-CM

## 2024-03-18 DIAGNOSIS — F410 Panic disorder [episodic paroxysmal anxiety] without agoraphobia: Secondary | ICD-10-CM

## 2024-03-18 MED ORDER — CLONAZEPAM 0.5 MG PO TABS: 0.5000 mg | ORAL_TABLET | Freq: Three times a day (TID) | ORAL | 0 refills | Status: DC | PRN

## 2024-03-19 ENCOUNTER — Telehealth: Payer: MEDICAID | Admitting: Physician Assistant

## 2024-03-19 DIAGNOSIS — B379 Candidiasis, unspecified: Secondary | ICD-10-CM

## 2024-03-19 DIAGNOSIS — T3695XA Adverse effect of unspecified systemic antibiotic, initial encounter: Secondary | ICD-10-CM | POA: Diagnosis not present

## 2024-03-19 MED ORDER — FLUCONAZOLE 150 MG PO TABS: 150.0000 mg | ORAL_TABLET | ORAL | 0 refills | Status: DC | PRN

## 2024-03-19 NOTE — Progress Notes (Signed)

## 2024-03-22 ENCOUNTER — Telehealth: Payer: Self-pay

## 2024-03-22 NOTE — Telephone Encounter (Signed)
 [facility] forms received and placed in provider to be signed folder

## 2024-03-24 NOTE — Assessment & Plan Note (Signed)
 Patient has a history of urge urinary incontinence.  She was on Gemtesa. - Will fax information to [facility] for supply including chucks and pull-ups.

## 2024-03-26 ENCOUNTER — Encounter: Payer: MEDICAID | Admitting: Nurse Practitioner

## 2024-03-27 ENCOUNTER — Other Ambulatory Visit: Payer: Self-pay | Admitting: Nurse Practitioner

## 2024-03-30 ENCOUNTER — Telehealth: Payer: MEDICAID | Admitting: Family Medicine

## 2024-03-30 DIAGNOSIS — M5441 Lumbago with sciatica, right side: Secondary | ICD-10-CM

## 2024-03-30 NOTE — Progress Notes (Signed)
  Because you are on oxycodone  and tramadol  works best, you will need to be seen in person for any treatment need regarding pain- even if it is not at the ankle. We also do not prescribe tramadol  in the virtual setting. We feel your condition warrants further evaluation and recommend that you be seen in a face-to-face visit.   NOTE: There will be NO CHARGE for this E-Visit   If you are having a true medical emergency, please call 911.

## 2024-03-31 ENCOUNTER — Telehealth: Payer: Self-pay | Admitting: Nurse Practitioner

## 2024-03-31 ENCOUNTER — Emergency Department: Payer: MEDICAID

## 2024-03-31 ENCOUNTER — Emergency Department
Admission: EM | Admit: 2024-03-31 | Discharge: 2024-03-31 | Disposition: A | Discharge status: Home / Self Care | Payer: MEDICAID

## 2024-03-31 ENCOUNTER — Other Ambulatory Visit: Payer: Self-pay

## 2024-03-31 DIAGNOSIS — J45909 Unspecified asthma, uncomplicated: Secondary | ICD-10-CM | POA: Insufficient documentation

## 2024-03-31 DIAGNOSIS — I10 Essential (primary) hypertension: Secondary | ICD-10-CM | POA: Insufficient documentation

## 2024-03-31 DIAGNOSIS — Y828 Other medical devices associated with adverse incidents: Secondary | ICD-10-CM | POA: Insufficient documentation

## 2024-03-31 DIAGNOSIS — M79661 Pain in right lower leg: Secondary | ICD-10-CM | POA: Diagnosis not present

## 2024-03-31 DIAGNOSIS — T8141XD Infection following a procedure, superficial incisional surgical site, subsequent encounter: Secondary | ICD-10-CM

## 2024-03-31 DIAGNOSIS — T8141XA Infection following a procedure, superficial incisional surgical site, initial encounter: Secondary | ICD-10-CM | POA: Insufficient documentation

## 2024-03-31 DIAGNOSIS — E1121 Type 2 diabetes mellitus with diabetic nephropathy: Secondary | ICD-10-CM | POA: Diagnosis not present

## 2024-03-31 LAB — CBC WITH DIFFERENTIAL/PLATELET
Abs Immature Granulocytes: 0.03 K/uL (ref 0.00–0.07)
Basophils Absolute: 0.1 K/uL (ref 0.0–0.1)
Basophils Relative: 1 %
Eosinophils Absolute: 0.2 K/uL (ref 0.0–0.5)
Eosinophils Relative: 3 %
HCT: 40.3 % (ref 36.0–46.0)
Hemoglobin: 12.7 g/dL (ref 12.0–15.0)
Immature Granulocytes: 0 %
Lymphocytes Relative: 39 %
Lymphs Abs: 3 K/uL (ref 0.7–4.0)
MCH: 29.4 pg (ref 26.0–34.0)
MCHC: 31.5 g/dL (ref 30.0–36.0)
MCV: 93.3 fL (ref 80.0–100.0)
Monocytes Absolute: 0.4 K/uL (ref 0.1–1.0)
Monocytes Relative: 6 %
Neutro Abs: 3.9 K/uL (ref 1.7–7.7)
Neutrophils Relative %: 51 %
Platelets: 229 K/uL (ref 150–400)
RBC: 4.32 MIL/uL (ref 3.87–5.11)
RDW: 12.8 % (ref 11.5–15.5)
WBC: 7.6 K/uL (ref 4.0–10.5)
nRBC: 0 % (ref 0.0–0.2)

## 2024-03-31 LAB — COMPREHENSIVE METABOLIC PANEL WITH GFR
ALT: 16 U/L (ref 0–44)
AST: 19 U/L (ref 15–41)
Albumin: 3.9 g/dL (ref 3.5–5.0)
Alkaline Phosphatase: 115 U/L (ref 38–126)
Anion gap: 12 (ref 5–15)
BUN: 22 mg/dL — ABNORMAL HIGH (ref 6–20)
CO2: 26 mmol/L (ref 22–32)
Calcium: 8.9 mg/dL (ref 8.9–10.3)
Chloride: 103 mmol/L (ref 98–111)
Creatinine, Ser: 0.81 mg/dL (ref 0.44–1.00)
GFR, Estimated: >60 mL/min (ref >60)
Glucose, Bld: 150 mg/dL — ABNORMAL HIGH (ref 70–99)
Potassium: 3.5 mmol/L (ref 3.5–5.1)
Sodium: 141 mmol/L (ref 135–145)
Total Bilirubin: 0.4 mg/dL (ref 0.0–1.2)
Total Protein: 7.1 g/dL (ref 6.5–8.1)

## 2024-03-31 LAB — HCG, QUANTITATIVE, PREGNANCY: hCG, Beta Chain, Quant, S: 5 m[IU]/mL — ABNORMAL HIGH (ref <5)

## 2024-03-31 MED ORDER — IBUPROFEN 800 MG PO TABS: 800.0000 mg | ORAL_TABLET | Freq: Three times a day (TID) | ORAL | 0 refills | Status: DC | PRN

## 2024-03-31 MED ORDER — SODIUM CHLORIDE 0.9 % IV SOLN
2.0000 g | Freq: Once | INTRAVENOUS | Status: AC
  Administered 2024-03-31: 2 g via INTRAVENOUS
  Filled 2024-03-31: qty 20

## 2024-03-31 MED ORDER — MORPHINE SULFATE (PF) 4 MG/ML IV SOLN
4.0000 mg | Freq: Once | INTRAVENOUS | Status: AC
  Administered 2024-03-31: 4 mg via INTRAVENOUS
  Filled 2024-03-31: qty 1

## 2024-03-31 MED ORDER — CEFPODOXIME PROXETIL 200 MG PO TABS: 400.0000 mg | ORAL_TABLET | Freq: Two times a day (BID) | ORAL | 0 refills | Status: AC

## 2024-03-31 MED ORDER — OXYCODONE HCL 5 MG PO TABS: 10.0000 mg | ORAL_TABLET | Freq: Three times a day (TID) | ORAL | 0 refills | Status: DC | PRN

## 2024-03-31 MED ORDER — DOXYCYCLINE HYCLATE 100 MG PO TABS: 100.0000 mg | ORAL_TABLET | Freq: Two times a day (BID) | ORAL | 0 refills | Status: AC

## 2024-03-31 MED ORDER — DOXYCYCLINE HYCLATE 100 MG PO TABS
100.0000 mg | ORAL_TABLET | Freq: Once | ORAL | Status: AC
  Administered 2024-03-31: 100 mg via ORAL
  Filled 2024-03-31: qty 1

## 2024-03-31 MED ORDER — ACETAMINOPHEN 500 MG PO TABS: 1000.0000 mg | ORAL_TABLET | Freq: Four times a day (QID) | ORAL | 2 refills | Status: DC | PRN

## 2024-03-31 MED ORDER — KETOROLAC TROMETHAMINE 15 MG/ML IJ SOLN
15.0000 mg | Freq: Once | INTRAMUSCULAR | Status: AC
  Administered 2024-03-31: 15 mg via INTRAVENOUS
  Filled 2024-03-31: qty 1

## 2024-03-31 MED ORDER — IOHEXOL 300 MG/ML  SOLN
100.0000 mL | Freq: Once | INTRAMUSCULAR | Status: AC | PRN
  Administered 2024-03-31: 100 mL via INTRAVENOUS

## 2024-03-31 NOTE — ED Notes (Signed)
 Pt advises since her last surgery in Oct that she has not had pulses in that foot or been able to wiggle her toes and her doctors are aware of that.

## 2024-03-31 NOTE — Discharge Instructions (Addendum)
 Your evaluation in the emergency department was overall reassuring.  I suspect you may have an early infection around your Ex-Fix insertion sites, and I have started you on a course of antibiotics to treat this.  Please follow-up with your primary care provider and orthopedic surgeon for reevaluation, and return to the emergency department with any new or worsening symptoms.

## 2024-03-31 NOTE — Telephone Encounter (Signed)
 Copied from CRM [id number]. Topic: General - Call Back - No Documentation >> Mar 31, 2024  1:20 PM Leah C wrote: Reason for CRM: Amy with [employer] called back to state that order was faxed and received. However, the certificate of medical necessity was not signed and dated at the bottom.  Amy stated that she will fax over a new document to be filled out.   Fax number: [phone] - call back number

## 2024-03-31 NOTE — ED Provider Notes (Signed)
 [facility] Provider Note    Event Date/Time   First MD Initiated Contact with Patient 03/31/24 1550     (approximate)   History   Ankle Pain  Right ankle pain. Pt had surgery Oct 12th and pt is having severe pain to the point of vomiting. Pt has pins and screws currently in her ankle. Pt called her surgeons office and was advised to go the ED.   HPI Andrea [name] is a 43 y.o. female PMH T2DM, diabetic nephropathy, hypertension, bipolar 1 disorder, asthma, schizophrenia, seizures presents for evaluation of right ankle pain - Patient tells me she originally had her right lower leg surgery in November 2024.  Since then has had multiple surgical revisions and is now in an Ex-Fix.  Apparently developed severe pain on her anterior mid/upper shin over the past 2 days.  No fever.  Chronically has inability to move her right toes and loss of sensation in her toes.  Believes there is some mild swelling in her upper shin as well. - Is taking oxycodone  at home with no relief - Had similar episode 2 weeks ago, went to [facility] ED, workup included CT of leg, felt to have cellulitis, discharged with cefpodoxime and doxycycline . - Is no longer on antibiotics  Per chart review, patient has had several ED and urgent care visits relating to infection of wound since surgery.      Physical Exam   Triage Vital Signs: ED Triage Vitals  Encounter Vitals Group     BP 03/31/24 1519 (!) 134/90     Girls Systolic BP Percentile --      Girls Diastolic BP Percentile --      Boys Systolic BP Percentile --      Boys Diastolic BP Percentile --      Pulse Rate 03/31/24 1519 (!) 103     Resp 03/31/24 1519 18     Temp 03/31/24 1519 (!) 97.5 F (36.4 C)     Temp Source 03/31/24 1519 Oral     SpO2 03/31/24 1519 96 %     Weight --      Height --      Head Circumference --      Peak Flow --      Pain Score 03/31/24 1520 9     Pain Loc --      Pain Education --      Exclude  from Growth Chart --     Most recent vital signs: Vitals:   03/31/24 1519 03/31/24 1820  BP: (!) 134/90 111/87  Pulse: (!) 103 93  Resp: 18 16  Temp: (!) 97.5 F (36.4 C)   SpO2: 96% 100%     General: Awake, no distress.  CV:  Good peripheral perfusion. RP 2+, HR 90s at time of my eval Resp:  Normal effort. Abd:  No distention. Nontender to deep palpation throughout RLE:   Ex-fix in place, mild discharge at 2 insertion sites on anterior upper shin.  Mild swelling of shin down through foot.  No fluctuance.  No overlying skin changes.  Pedal pulse 2+.  Patient says she has inability to wiggle her toes which is chronic since her surgery.  Pedal pulse 2+.  Brisk cap refill distally.    ED Results / Procedures / Treatments   Labs (all labs ordered are listed, but only abnormal results are displayed) Labs Reviewed  COMPREHENSIVE METABOLIC PANEL WITH GFR - Abnormal; Notable for the following components:  Result Value   Glucose, Bld 150 (*)    BUN 22 (*)    All other components within normal limits  HCG, QUANTITATIVE, PREGNANCY - Abnormal; Notable for the following components:   hCG, Beta Chain, Quant, S 5 (*)    All other components within normal limits  AEROBIC/ANAEROBIC CULTURE W GRAM STAIN (SURGICAL/DEEP WOUND)  CBC WITH DIFFERENTIAL/PLATELET     EKG  N/a   RADIOLOGY Radiology interpreted myself and radiology report reviewed.  No underlying abscess or hardware/bone complication.  Possible cellulitic changes.    PROCEDURES:  Critical Care performed: No  Procedures   MEDICATIONS ORDERED IN ED: Medications  cefTRIAXone  (ROCEPHIN ) 2 g in sodium chloride  0.9 % 100 mL IVPB (2 g Intravenous New Bag/Given 03/31/24 1912)  morphine  (PF) 4 MG/ML injection 4 mg (4 mg Intravenous Given 03/31/24 1651)  ketorolac  (TORADOL ) 15 MG/ML injection 15 mg (15 mg Intravenous Given 03/31/24 1650)  iohexol  (OMNIPAQUE ) 300 MG/ML solution 100 mL (100 mLs Intravenous Contrast Given  03/31/24 1817)  morphine  (PF) 4 MG/ML injection 4 mg (4 mg Intravenous Given 03/31/24 1912)  doxycycline  (VIBRA -TABS) tablet 100 mg (100 mg Oral Given 03/31/24 1912)     IMPRESSION / MDM / ASSESSMENT AND PLAN / ED COURSE  I reviewed the triage vital signs and the nursing notes.                              DDX/MDM/AP: Differential diagnosis includes, but is not limited to, chronic pain from Ex-Fix, consider cellulitis or abscess, do not clinically suspect compartment syndrome at this time, consider hardware malfunction or displacement, doubt underlying recurrent fracture.  Plan: - Labs - Pain control - CT right leg - wound culture from discharge from surgical site - Reassess  Patient's presentation is most consistent with acute presentation with potential threat to life or bodily function.  The patient is on the cardiac monitor to evaluate for evidence of arrhythmia and/or significant heart rate changes.  ED course below.  Pain controlled in emergency department.  Treating for presumed mild cellulitis in the superficial soft tissues around Ex-Fix insertion points.  Rx cefpodoxime, doxycycline .  Has orthopedic follow-up tomorrow.  Had previously been taking 10 mg oxycodone  but has run out, will prescribe short course in addition to Tylenol  and Motrin .  ED return precautions in place.  Patient agrees with plan.  Clinical Course as of 03/31/24 1927  Wed Mar 31, 2024  1706 Cbc wnl [MM]  1807 CMP reviewed, overall remarkable  hCG very mildly elevated, equivocal as to whether this represents a true pregnancy [MM]  1852 CT tib/fib + foot w/ contrast: IMPRESSION: Interval removal of hardware in the tibia, talus and calcaneus with placement of external fixator device. No acute bony abnormality. No evidence of osteomyelitis.  Edema within the subcutaneous soft tissues could reflect cellulitis. No focal/drainable fluid collection.   [MM]  1923 Patient reevaluated, pain  well-controlled.  Amenable to discharge home with antibiotics.  He is also requesting brief course of pain medications.  Does have follow-up with her orthopedic surgeon tomorrow.  Also plan for PMD follow-up.  ED return precautions in place.  Patient agrees with plan. [MM]    Clinical Course User Index [MM] Clarine Ozell LABOR, MD     FINAL CLINICAL IMPRESSION(S) / ED DIAGNOSES   Final diagnoses:  Infection of superficial incisional surgical site after procedure, subsequent encounter  Pain in right shin     Rx /  DC Orders   ED Discharge Orders          Ordered    cefpodoxime (VANTIN) 200 MG tablet  2 times daily        03/31/24 1919    doxycycline  (VIBRA -TABS) 100 MG tablet  2 times daily        03/31/24 1919    acetaminophen  (TYLENOL ) 500 MG tablet  Every 6 hours PRN        03/31/24 1925    ibuprofen  (ADVIL ) 800 MG tablet  Every 8 hours PRN        03/31/24 1925    oxyCODONE  (ROXICODONE ) 5 MG immediate release tablet  Every 8 hours PRN        03/31/24 1925             Note:  This document was prepared using Dragon voice recognition software and may include unintentional dictation errors.   Clarine Ozell LABOR, MD 03/31/24 [phone]

## 2024-03-31 NOTE — Telephone Encounter (Signed)
 No medical necessity is Andrea [name]'s folder for this patient but will continue to look for it.

## 2024-03-31 NOTE — ED Triage Notes (Signed)
 Right ankle pain. Pt had surgery Oct 12th and pt is having severe pain to the point of vomiting. Pt has pins and screws currently in her ankle. Pt called her surgeons office and was advised to go the ED.

## 2024-04-01 NOTE — Telephone Encounter (Signed)
 [employer] states Andrea [name] needs to sign the medical necessity and that they are faxing it to our office.

## 2024-04-02 ENCOUNTER — Telehealth: Payer: Self-pay | Admitting: Nurse Practitioner

## 2024-04-02 ENCOUNTER — Other Ambulatory Visit: Payer: Self-pay | Admitting: Nurse Practitioner

## 2024-04-02 DIAGNOSIS — M899 Disorder of bone, unspecified: Secondary | ICD-10-CM

## 2024-04-02 MED ORDER — TIZANIDINE HCL 4 MG PO TABS: 4.0000 mg | ORAL_TABLET | Freq: Three times a day (TID) | ORAL | 3 refills | Status: DC | PRN

## 2024-04-02 NOTE — Telephone Encounter (Signed)
 Patient requested refill of tizanidine  4 MG. Per [facility], patient requested removal. This RN is unable to submit refill request.

## 2024-04-02 NOTE — Telephone Encounter (Unsigned)
 Copied from CRM #[id number]. Topic: Clinical - Medication Refill >> Apr 02, 2024  2:26 PM Shereese L wrote: Medication: tiZANidine  (ZANAFLEX ) 4 MG tablet  Has the patient contacted their pharmacy? Yes (Agent: If no, request that the patient contact the pharmacy for the refill. If patient does not wish to contact the pharmacy document the reason why and proceed with request.) (Agent: If yes, when and what did the pharmacy advise?)  This is the patient's preferred pharmacy:  [id number] - [address] [place] [address] Phone: [phone] Fax: [phone]  Is this the correct pharmacy for this prescription? Yes If no, delete pharmacy and type the correct one.   Has the prescription been filled recently? Yes  Is the patient out of the medication? Yes  Has the patient been seen for an appointment in the last year OR does the patient have an upcoming appointment? Yes  Can we respond through MyChart? Yes  Agent: Please be advised that Rx refills may take up to 3 business days. We ask that you follow-up with your pharmacy.

## 2024-04-05 LAB — AEROBIC/ANAEROBIC CULTURE W GRAM STAIN (SURGICAL/DEEP WOUND): Culture: NORMAL

## 2024-04-08 ENCOUNTER — Other Ambulatory Visit: Payer: Self-pay

## 2024-04-08 ENCOUNTER — Emergency Department
Admission: EM | Admit: 2024-04-08 | Discharge: 2024-04-08 | Disposition: A | Discharge status: Home / Self Care | Payer: MEDICAID | Attending: Emergency Medicine | Admitting: Emergency Medicine

## 2024-04-08 DIAGNOSIS — E119 Type 2 diabetes mellitus without complications: Secondary | ICD-10-CM | POA: Diagnosis not present

## 2024-04-08 DIAGNOSIS — I10 Essential (primary) hypertension: Secondary | ICD-10-CM | POA: Insufficient documentation

## 2024-04-08 DIAGNOSIS — M792 Neuralgia and neuritis, unspecified: Secondary | ICD-10-CM | POA: Diagnosis not present

## 2024-04-08 DIAGNOSIS — M25571 Pain in right ankle and joints of right foot: Secondary | ICD-10-CM | POA: Insufficient documentation

## 2024-04-08 LAB — CBC
HCT: 44.7 % (ref 36.0–46.0)
Hemoglobin: 14 g/dL (ref 12.0–15.0)
MCH: 29 pg (ref 26.0–34.0)
MCHC: 31.3 g/dL (ref 30.0–36.0)
MCV: 92.5 fL (ref 80.0–100.0)
Platelets: 294 K/uL (ref 150–400)
RBC: 4.83 MIL/uL (ref 3.87–5.11)
RDW: 12.9 % (ref 11.5–15.5)
WBC: 9.7 K/uL (ref 4.0–10.5)
nRBC: 0 % (ref 0.0–0.2)

## 2024-04-08 LAB — COMPREHENSIVE METABOLIC PANEL WITH GFR
ALT: 13 U/L (ref 0–44)
AST: 25 U/L (ref 15–41)
Albumin: 4.1 g/dL (ref 3.5–5.0)
Alkaline Phosphatase: 123 U/L (ref 38–126)
Anion gap: 14 (ref 5–15)
BUN: 20 mg/dL (ref 6–20)
CO2: 20 mmol/L — ABNORMAL LOW (ref 22–32)
Calcium: 9.1 mg/dL (ref 8.9–10.3)
Chloride: 104 mmol/L (ref 98–111)
Creatinine, Ser: 0.98 mg/dL (ref 0.44–1.00)
GFR, Estimated: >60 mL/min (ref >60)
Glucose, Bld: 187 mg/dL — ABNORMAL HIGH (ref 70–99)
Potassium: 3.6 mmol/L (ref 3.5–5.1)
Sodium: 138 mmol/L (ref 135–145)
Total Bilirubin: 0.3 mg/dL (ref 0.0–1.2)
Total Protein: 7.7 g/dL (ref 6.5–8.1)

## 2024-04-08 MED ORDER — GABAPENTIN 600 MG PO TABS: 900.0000 mg | ORAL_TABLET | Freq: Three times a day (TID) | ORAL | 0 refills | Status: DC

## 2024-04-08 MED ORDER — OXYCODONE HCL 5 MG PO TABS
10.0000 mg | ORAL_TABLET | Freq: Once | ORAL | Status: AC
  Administered 2024-04-08: 10 mg via ORAL
  Filled 2024-04-08: qty 2

## 2024-04-08 NOTE — ED Triage Notes (Signed)
 Pt sts that she went to UC today who said that pt ankle might be infected again. Pt has ankle reconstruction surgery on 9/12. Pt leg is in a halo with rods. Pt had her surgery done at [facility]. Pt sts that her surgeon is aware of the situation and just advised her to take tylenol .

## 2024-04-08 NOTE — Discharge Instructions (Signed)
 You were seen in the emergency department today for evaluation of your ankle pain.  I fortunately do not think this looks infected.  I suspect you may be having some worsening neuropathy.  I have sent a prescription for an increased dose of gabapentin  to your pharmacy.  Continue to follow-up with your surgical team as directed.  Return to the ER for new or worsening symptoms.

## 2024-04-08 NOTE — ED Provider Notes (Signed)
 [facility] Provider Note    Event Date/Time   First MD Initiated Contact with Patient 04/08/24 1926     (approximate)   History   Ankle Pain   HPI  Andrea [name] is a 43 year old female with history of T2DM, HTN, bipolar disorder presenting to the emergency department for evaluation of right ankle pain.  Patient has had multiple surgeries on her right leg.  Currently in a halo Ex-Fix.  Reports this is post be removed on Tuesday.  Has been seen multiple times in urgent care and emergency departments including her own with concerns for infection and adequate pain control.  Reports that she recently was on antibiotics and does think that this improved the appearance of her leg, but has had some worsening pain over the past few days.  Had a telemedicine visit with her [facility] wound care team on 10/20.  They did not recommend further narcotic pain management at that time.  Patient reports adequate pain control with Tylenol  and ibuprofen .  Report occasional sharp shooting pain through her leg, is on gabapentin  for neuropathy currently taking 600 mg 3 times daily.  Reviewed ER visit at [facility] from 03/11/2024.  See performed at that time without evidence of deep space infection, treated for cellulitis.  Seen at urgent care multiple times since then as well as in our ER on 10/15.  Discharged on oral antibiotics at that time.     Physical Exam   Triage Vital Signs: ED Triage Vitals  Encounter Vitals Group     BP 04/08/24 1516 (!) 134/97     Girls Systolic BP Percentile --      Girls Diastolic BP Percentile --      Boys Systolic BP Percentile --      Boys Diastolic BP Percentile --      Pulse Rate 04/08/24 1516 100     Resp 04/08/24 1516 18     Temp 04/08/24 1516 97.6 F (36.4 C)     Temp Source 04/08/24 1516 Oral     SpO2 04/08/24 1516 98 %     Weight 04/08/24 1517 275 lb (124.7 kg)     Height 04/08/24 1517 5' 11 (1.803 m)     Head Circumference --       Peak Flow --      Pain Score 04/08/24 1517 10     Pain Loc --      Pain Education --      Exclude from Growth Chart --     Most recent vital signs: Vitals:   04/08/24 1516 04/08/24 2028  BP: (!) 134/97 (!) 139/99  Pulse: 100 (!) 101  Resp: 18 18  Temp: 97.6 F (36.4 C)   SpO2: 98% 99%     General: Awake, interactive  CV:  Good peripheral perfusion Resp:  Unlabored respirations Abd:  Nondistended.  Neuro:  Symmetric facial movement, fluid speech Other:   There is a Ex-Fix in place over the lower extremity.  There are staples in place over the dorsum of the right foot.  There are intact distal pulses.  There are multiple fixation points with minimal localized erythema and granulation tissue, but no surrounding erythema, warmth appreciable on my exam.  ED Results / Procedures / Treatments   Labs (all labs ordered are listed, but only abnormal results are displayed) Labs Reviewed  COMPREHENSIVE METABOLIC PANEL WITH GFR - Abnormal; Notable for the following components:      Result Value  CO2 20 (*)    Glucose, Bld 187 (*)    All other components within normal limits  CBC     EKG EKG independently reviewed and interpreted by myself demonstrates:    RADIOLOGY Imaging independently reviewed and interpreted by myself demonstrates:   Formal Radiology Read:  No results found.  PROCEDURES:  Critical Care performed: No  Procedures   MEDICATIONS ORDERED IN ED: Medications  oxyCODONE  (Oxy IR/ROXICODONE ) immediate release tablet 10 mg (10 mg Oral Given 04/08/24 2011)     IMPRESSION / MDM / ASSESSMENT AND PLAN / ED COURSE  I reviewed the triage vital signs and the nursing notes.  Differential diagnosis includes, but is not limited to, pain related to Ex-Fix in place, worsening neuropathy in the setting of leg immobilization,  Patient's presentation is most consistent with acute complicated illness / injury requiring diagnostic workup.  43 year old female  presenting with ongoing pain at the site of surgical external fixation over her leg.  Mild tachycardia on presentation, reassuring blood pressure, afebrile.  CBC with reassuring white blood cell count, CMP without critical derangements.  There has been recent concerns for infection, but patient recently on antibiotics and reports improvement following this.  I do not see any evidence of recurrent infection on my exam.  Low suspicion deep space infection, do not feel there is an indication for repeat CT.  She was given pain medication here.  I did discuss that I do not feel comfortable sending her with a narcotic pain prescription for what seems to be primarily postoperative pain particularly as this was not recommended by her surgical team.  She does describe what sounds like a component of neuropathic pain, so we will trial increasing her dose of gabapentin .  Strict return precautions provided.  Patient discharged in stable condition.     FINAL CLINICAL IMPRESSION(S) / ED DIAGNOSES   Final diagnoses:  Right ankle pain, unspecified chronicity  Neuropathic pain     Rx / DC Orders   ED Discharge Orders          Ordered    gabapentin  (NEURONTIN ) 600 MG tablet  3 times daily        04/08/24 2050             Note:  This document was prepared using Dragon voice recognition software and may include unintentional dictation errors.   Levander Slate, MD 04/08/24 [phone]

## 2024-04-08 NOTE — Progress Notes (Signed)
 PROVIDER NOTE: Interpretation of information contained herein should be left to medically-trained personnel. Specific patient instructions are provided elsewhere under Patient Instructions section of medical record. This document was created in part using AI and STT-dictation technology, any transcriptional errors that may result from this process are unintentional.  Patient: Andrea [name]  Service: E/M   PCP: Gretel App, NP  DOB: [birth year]  DOS: 04/12/2024  Provider: Eric DELENA Como, MD  MRN: [id number]  Delivery: Face-to-face  Specialty: Interventional Pain Management  Type: Established Patient  Setting: Ambulatory outpatient facility  Specialty designation: 09  Referring Prov.: Vincente Saber, NP  Location: Outpatient office facility       Primary Reason(s) for Visit: Encounter for evaluation before starting new chronic pain management plan of care (Level of risk: moderate) CC: Ankle Pain (Right )  HPI  Andrea [name] is a 43 y.o. year old, female patient, who comes today for a follow-up evaluation to review the test results and decide on a treatment plan. 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; Other insomnia; Long term current use of antipsychotic medication; Chronic ankle pain (1ry area of Pain) (Right); Bipolar 1 disorder (HCC); Type 2 diabetes mellitus with peripheral neuropathy (HCC); Essential hypertension; Mild persistent asthma; Pre-syncope; Postoperative surgical complication involving musculoskeletal system associated with musculoskeletal procedure; Acquired varus deformity of ankle (Right); Syncope due to orthostatic hypotension;  Hypotension; Occasional tremors; Constipation; Acute right-sided low back pain without sciatica; Acute flank pain (Right); Fluctuating blood pressure; Schizoaffective disorder, bipolar type (HCC); Closed trimalleolar fracture of ankle, sequela (Right); Elevated blood pressure reading; Bipolar affective disorder, mixed, severe, with psychotic behavior (HCC); Delayed union of ankle fracture, closed (Right); Fall at home, sequela; Neuropathic ulcer of heel, limited to breakdown of skin (HCC) (Right); Panic disorder; Diabetic nephropathy associated with type 2 diabetes mellitus (HCC); Chronic pain syndrome; Pharmacologic therapy; Disorder of skeletal system; Problems influencing health status; Long term prescription benzodiazepine use; Long-term current use of opiate analgesic; Chronic prescription opiate use; Closed ankle fracture, sequela (Right); Chronic postoperative pain of ankle (Right); UTI symptoms; History of removal of internal fixation device; Moderate recurrent major depression (HCC); Primary localized osteoarthrosis of ankle and foot; Schizophrenia (HCC); 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 Her primarily concern today is the Ankle Pain (Right )  Pain Assessment: Location: Right Ankle Radiating: Radaites from right ankle up towards the right knee and radiates down to entire right foot Onset: More than a month ago Duration: Acute pain (Surgical pain) Quality: Constant, Shooting, Stabbing Severity: 10-Worst pain ever/10 (subjective, self-reported pain score)  Effect on ADL: Limts ADLs. Timing: Constant Modifying factors: Denies BP: (!) 118/93  HR: 81  Andrea [name] comes in today for a follow-up visit after her initial evaluation on 02/09/2024. Today we went over the results of her tests. These were explained in Layman's terms. During today's appointment we went over my diagnostic impression,  as well as the proposed treatment plan.  Review of initial evaluation (01/19/2024): Andrea [name] is a 43 year old female who presents with right ankle pain and limited mobility following multiple surgeries for a fracture.   In November, she sustained a fracture to her right ankle after a fall. 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 Despite the surgeries, she has persistent pain in the right ankle, especially when walking.   She has completed twelve weeks of physical therapy, attending sessions twice a week, but reports no improvement in her symptoms. She experiences significant limitations in mobility as her right ankle is fused, resulting in no movement at all.   Review of diagnostic test ordered on 01/19/2024:  Diagnostic lab work: C-reactive protein, sed rate, and magnesium  levels were all within normal limits.  Elevated vitamin B12 levels.  UDS positive for clonazepam  and its metabolites as well as oxycodone  and its metabolites.  04/12/2024: The patient returns for her second visit still having pain in the ankle from her most recent surgery.  She still has an external fixation device which is scheduled to be removed tomorrow.  Because every time that the patient has a surgery it officially reset her clock in terms of her pain, we are still dealing with ACUTE PAIN associated with her recent surgeries.  This does not fall within the realm of Chronic Pain Management and he needs to continue to being managed by the surgeon.  Should the patient continue to have pain 90 days past the expected time of recovery with no further surgical interventions, this would then fall under the definition of chronic pain.   Discussed the use of AI scribe software for clinical note transcription with the patient, who gave verbal consent to proceed.  History of Present Illness   Andrea [name] is a 43 year old female who presents with severe  leg pain following recent surgery. She was referred by Dr. Kersner for evaluation of her post-surgical pain management.  She underwent surgery on September 12th and experiences severe pain in the top of her leg, radiating upwards, persisting for two weeks without improvement. Her current pain management includes gabapentin  900 mg three times daily, ibuprofen  800 mg three times daily, and Tylenol  1000 mg three times daily, with no side effects from gabapentin . She has previously used oxycodone  10 mg for pain management. She is scheduled for removal of her external fixation device tomorrow. She can roll over and get up on all fours, but her post-removal mobility is uncertain.      Patient presented with interventional treatment options. Ms. Dant was informed that I will not be providing medication management. Pharmacotherapy evaluation including recommendations may be offered, if specifically requested.   Controlled Substance Pharmacotherapy Assessment REMS (Risk Evaluation and Mitigation Strategy)  Opioid Analgesic: Oxycodone /APAP 5/325, 1 tab p.o. every 4 hours (# 30) (last filled on 02/03/2024) (According to PMP, patient has been on some form of opioid analgesics since before September 2023.) MME/day: 32.14-90 mg/day   Pill Count: None expected due to no prior prescriptions written by our practice. Bonner Norris, RN  04/12/2024  8:10 AM  Sign when Signing Visit Safety precautions to be maintained throughout the outpatient stay will include: orient to surroundings, keep bed in low position, maintain call bell within reach at all times, provide assistance with transfer out of bed and ambulation.     Pharmacokinetics: Liberation and absorption (onset of action): WNL Distribution (time to peak effect): WNL Metabolism and excretion (duration of action): WNL         Pharmacodynamics: Desired effects: Analgesia: Ms. Kerce reports >50% benefit. Functional ability: Patient reports that  medication allows her to accomplish basic ADLs Clinically meaningful improvement in function (CMIF): Sustained CMIF goals  met Perceived effectiveness: Described as relatively effective, allowing for increase in activities of daily living (ADL) Undesirable effects: Side-effects or Adverse reactions: None reported Monitoring: [place] PMP: PDMP reviewed during this encounter. Online review of the past 67-month period previously conducted. Not applicable at this point since we have not taken over the patient's medication management yet. List of other Serum/Urine Drug Screening Test(s):  Lab Results  Component Value Date   COCAINSCRNUR NONE DETECTED 05/27/2023   COCAINSCRNUR NONE DETECTED 11/27/2021   COCAINSCRNUR NONE DETECTED 12/20/2016   THCU NONE DETECTED 05/27/2023   THCU NONE DETECTED 11/27/2021   THCU NONE DETECTED 12/20/2016   ETH <5 12/20/2016   List of all UDS test(s) done:  Lab Results  Component Value Date   SUMMARY FINAL 01/19/2024   Last UDS on record: Summary  Date Value Ref Range Status  01/19/2024 FINAL  Final    Comment:    ==================================================================== Compliance Drug Analysis, Ur ==================================================================== Test                             Result       Flag       Units  Drug Present and Declared for Prescription Verification   7-aminoclonazepam              36           EXPECTED   ng/mg creat    7-aminoclonazepam is an expected metabolite of clonazepam . Source of    clonazepam  is a scheduled prescription medication.    Oxycodone                       2076         EXPECTED   ng/mg creat   Oxymorphone                    106          EXPECTED   ng/mg creat   Noroxycodone                   3254         EXPECTED   ng/mg creat   Noroxymorphone                 255          EXPECTED   ng/mg creat    Sources of oxycodone  are scheduled prescription medications.    Oxymorphone, noroxycodone, and  noroxymorphone are expected    metabolites of oxycodone . Oxymorphone is also available as a    scheduled prescription medication.    Gabapentin                      PRESENT      EXPECTED   Zonisamide                      PRESENT      EXPECTED   Tizanidine                      PRESENT      EXPECTED   Trazodone                       PRESENT      EXPECTED   1,3 chlorophenyl piperazine    PRESENT      EXPECTED    1,3-chlorophenyl piperazine is an expected metabolite of trazodone .  Duloxetine                      PRESENT      EXPECTED   Quetiapine                      PRESENT      EXPECTED   Acetaminophen                   PRESENT      EXPECTED  Drug Present not Declared for Prescription Verification   Alcohol , Ethyl                 0.040        UNEXPECTED g/dL    Sources of ethyl alcohol  include alcoholic beverages or as a    fermentation product of glucose; glucose is present in this specimen.    Interpret result with caution, as the presence of ethyl alcohol  is    likely due, at least in part, to fermentation of glucose.    Ibuprofen                       PRESENT      UNEXPECTED   Naproxen                        PRESENT      UNEXPECTED ==================================================================== Test                      Result    Flag   Units      Ref Range   Creatinine              186              mg/dL      >=79 ==================================================================== Declared Medications:  The flagging and interpretation on this report are based on the  following declared medications.  Unexpected results may arise from  inaccuracies in the declared medications.   **Note: The testing scope of this panel includes these medications:   Clonazepam  (Klonopin )  Duloxetine  (Cymbalta )  Gabapentin  (Neurontin )  Oxycodone  (Percocet)  Quetiapine  (Seroquel )  Trazodone  (Desyrel )  Zonisamide  (Zonegran )   **Note: The testing scope of this panel does not include small to   moderate amounts of these reported medications:   Acetaminophen  (Percocet)  Tizanidine  (Zanaflex )   **Note: The testing scope of this panel does not include the  following reported medications:   Albuterol  (Airsupra)  Atogepant  (Qulipta )  Budesonide  (Airsupra)  Budesonide  (Symbicort)  Buspirone  (Buspar )  Cetirizine  (Zyrtec )  Eletriptan  (Relpax )  Empagliflozin  (Jardiance )  Famotidine  (Pepcid )  Fluconazole  (Diflucan )  Formoterol  (Symbicort)  Montelukast  (Singulair )  Naloxone  (Narcan )  Ondansetron  (Zofran )  Pantoprazole  (Protonix )  Potassium Chloride   Prazosin  (Minipress )  Semaglutide  (Ozempic )  Vibegron (Gemtesa) ==================================================================== For clinical consultation, please call [phone]. ====================================================================    UDS interpretation: No unexpected findings.          Medication Assessment Form: Not applicable. No opioids. Treatment compliance: Not applicable Risk Assessment Profile: Aberrant behavior: See initial evaluations. None observed or detected today Comorbid factors increasing risk of overdose: See initial evaluation. No additional risks detected today Opioid risk tool (ORT):     01/19/2024    9:51 AM  Opioid Risk   Alcohol  0  Illegal Drugs 0  Rx Drugs 0  Alcohol  0  Illegal Drugs 0  Rx Drugs 0  Age between 16-45 years  1  Psychological Disease 0  Depression 1  Opioid Risk Tool Scoring 2  Opioid Risk Interpretation Low Risk    ORT Scoring interpretation table:  Score <3 = Low Risk for SUD  Score between 4-7 = Moderate Risk for SUD  Score >8 = High Risk for Opioid Abuse   Risk of substance use disorder (SUD): Low  Risk Mitigation Strategies:  Patient opioid safety counseling: No controlled substances prescribed. Patient-Prescriber Agreement (PPA): No agreement signed.  Controlled substance notification to other providers: None required. No opioid  therapy.  Pharmacologic Plan: Non-opioid analgesic therapy offered. Interventional alternatives discussed.             Laboratory Chemistry Profile   Renal Lab Results  Component Value Date   BUN 20 04/08/2024   CREATININE 0.98 04/08/2024   BCR 20 12/31/2016   GFR 90.27 01/16/2024   GFRAA >60 01/01/2020   GFRNONAA >60 04/08/2024   SPECGRAV 1.015 01/06/2024   PHUR 6.0 01/06/2024   PROTEINUR trace (A) 01/06/2024     Electrolytes Lab Results  Component Value Date   NA 138 04/08/2024   K 3.6 04/08/2024   CL 104 04/08/2024   CALCIUM 9.1 04/08/2024   MG 2.1 01/19/2024   PHOS 4.5 11/26/2021     Hepatic Lab Results  Component Value Date   AST 25 04/08/2024   ALT 13 04/08/2024   ALBUMIN 4.1 04/08/2024   ALKPHOS 123 04/08/2024   AMYLASE 54 08/18/2007   LIPASE 57 (H) 03/30/2023     ID Lab Results  Component Value Date   HIV Non Reactive 05/04/2023   SARSCOV2NAA NEGATIVE 03/30/2023   STAPHAUREUS NEGATIVE 05/13/2023   MRSAPCR NEGATIVE 05/13/2023   PREGTESTUR NEGATIVE 01/01/2020     Bone Lab Results  Component Value Date   VD25OH 38.99 01/16/2024     Endocrine Lab Results  Component Value Date   GLUCOSE 187 (H) 04/08/2024   GLUCOSEU >=500 (A) 12/31/2023   HGBA1C 7.1 (H) 08/05/2023   TSH 0.66 01/16/2024     Neuropathy Lab Results  Component Value Date   VITAMINB12 1,965 (H) 01/19/2024   FOLATE 8.8 06/26/2023   HGBA1C 7.1 (H) 08/05/2023   HIV Non Reactive 05/04/2023     CNS No results found for: COLORCSF, APPEARCSF, RBCCOUNTCSF, WBCCSF, POLYSCSF, LYMPHSCSF, EOSCSF, PROTEINCSF, GLUCCSF, JCVIRUS, CSFOLI, IGGCSF, LABACHR, ACETBL   Inflammation (CRP: Acute  ESR: Chronic) Lab Results  Component Value Date   CRP 3 01/19/2024   ESRSEDRATE 12 01/19/2024   LATICACIDVEN 1.6 05/27/2023     Rheumatology No results found for: RF, ANA, LABURIC, URICUR, LYMEIGGIGMAB, LYMEABIGMQN, HLAB27   Coagulation Lab Results   Component Value Date   INR 1.1 11/26/2021   LABPROT 14.4 11/26/2021   PLT 294 04/08/2024   DDIMER <0.27 07/19/2023     Cardiovascular Lab Results  Component Value Date   CKTOTAL 79 07/31/2021   HGB 14.0 04/08/2024   HCT 44.7 04/08/2024     Screening Lab Results  Component Value Date   SARSCOV2NAA NEGATIVE 03/30/2023   STAPHAUREUS NEGATIVE 05/13/2023   MRSAPCR NEGATIVE 05/13/2023   HIV Non Reactive 05/04/2023   PREGTESTUR NEGATIVE 01/01/2020     Cancer No results found for: CEA, CA125, LABCA2   Allergens No results found for: ALMOND, APPLE, ASPARAGUS, AVOCADO, BANANA, BARLEY, BASIL, BAYLEAF, GREENBEAN, LIMABEAN, WHITEBEAN, BEEFIGE, REDBEET, BLUEBERRY, BROCCOLI, CABBAGE, MELON, CARROT, CASEIN, CASHEWNUT, CAULIFLOWER, CELERY     Note: Lab results reviewed.  Recent Diagnostic Imaging Review  Cervical Imaging: Cervical MR wo contrast: No results found for this or  any previous visit.  Cervical MR wo contrast: No results found for this or any previous visit.  Cervical CT wo contrast: No results found for this or any previous visit.  Cervical DG Bending/F/E views: No results found for this or any previous visit.   Shoulder Imaging: Shoulder-R MR wo contrast: No results found for this or any previous visit.  Shoulder-L MR wo contrast: No results found for this or any previous visit.  Shoulder-R DG: No results found for this or any previous visit.  Shoulder-L DG: No results found for this or any previous visit.   Thoracic Imaging: Thoracic MR wo contrast: No results found for this or any previous visit.  Thoracic MR wo contrast: No results found for this or any previous visit.  Thoracic CT wo contrast: No results found for this or any previous visit.  Thoracic DG 4 views: No results found for this or any previous visit.  Thoracic DG w/swimmers view: No results found for this or any previous visit.   Lumbosacral  Imaging: Lumbar MR wo contrast: No results found for this or any previous visit.  Lumbar MR wo contrast: No results found for this or any previous visit.  Lumbar CT wo contrast: No results found for this or any previous visit.  Lumbar DG Bending views: No results found for this or any previous visit.         Sacroiliac Joint Imaging: Sacroiliac Joint DG: No results found for this or any previous visit.   Hip Imaging: Hip-R MR wo contrast: No results found for this or any previous visit.  Hip-L MR wo contrast: No results found for this or any previous visit.  Hip-R CT wo contrast: No results found for this or any previous visit.  Hip-L CT wo contrast: No results found for this or any previous visit.  Hip-R DG 2-3 views: No results found for this or any previous visit.  Hip-L DG 2-3 views: No results found for this or any previous visit.  Hip-B DG Bilateral: No results found for this or any previous visit.  Hip-B DG Bilateral (5V): No results found for this or any previous visit.   Knee Imaging: Knee-R MR wo contrast: No results found for this or any previous visit.  Knee-L MR wo contrast: No results found for this or any previous visit.  Knee-R CT wo contrast: No results found for this or any previous visit.  Knee-L CT wo contrast: No results found for this or any previous visit.  Knee-R DG 4 views: Results for orders placed during the hospital encounter of 10/04/22  DG Knee Complete 4 Views Right  Narrative CLINICAL DATA:  eval for injury.  Fall  EXAM: RIGHT KNEE - COMPLETE 4+ VIEW  COMPARISON:  None Available.  FINDINGS: No evidence of fracture, dislocation, or joint effusion. No evidence of arthropathy or other focal bone abnormality. Mild subcutaneus soft tissue edema.  IMPRESSION: No acute displaced fracture or dislocation.   Electronically Signed By: Morgane  Naveau M.D. On: 10/04/2022 01:56  Knee-L DG 4 views: Results for orders placed during  the hospital encounter of 10/04/22  DG Knee Complete 4 Views Left  Narrative CLINICAL DATA:  eval for injury  EXAM: LEFT KNEE - COMPLETE 4+ VIEW  COMPARISON:  None Available.  FINDINGS: Vague vertical lucency along the medial posterior humeral condyle with no definite acute fracture. Otherwise no evidence of fracture, dislocation, or joint effusion. No evidence of arthropathy or other focal bone abnormality. Mild subcutaneus soft tissue  edema.  IMPRESSION: 1. Vague vertical lucency along the medial humeral condyle with no definite acute fracture. Correlate with point tenderness to palpation. 2. Otherwise no acute displaced fracture or dislocation.   Electronically Signed By: Morgane  Naveau M.D. On: 10/04/2022 01:58   Ankle Imaging: Ankle-R DG Complete: Results for orders placed in visit on 10/31/23  DG Ankle Complete Right  Narrative Please see detailed radiograph report in office note.  Ankle-L DG Complete: Results for orders placed in visit on 10/22/22  DG Ankle Complete Left  Narrative X-rays of the left ankle were obtained in clinic today.  There is a single screw in the medial malleolus.  This remains in stable position.  Mortise is congruent.  No syndesmotic disruption.  On the lateral projection, there is a small avulsion fracture of the superior aspect of the navicular.  This has not changed in overall alignment.  There has not been obvious callus formation.  Impression: Stable avulsion fracture of the superior aspect of the navicular   Foot Imaging: Foot-R DG Complete: Results for orders placed in visit on 08/07/23  DG Foot Complete Right  Narrative Please see detailed radiograph report in office note.  Foot-L DG Complete: Results for orders placed in visit on 12/20/22  DG Foot Complete Left  Narrative X-rays left foot were obtained in clinic today.  No acute injuries noted.  There is a small avulsion fracture of the dorsal aspect of the  navicular.  This remains in unchanged position.  No bony lesions.  No additional injuries.  Impression: Stable left navicular avulsion fracture   Elbow Imaging: Elbow-R DG Complete: No results found for this or any previous visit.  Elbow-L DG Complete: No results found for this or any previous visit.   Wrist Imaging: Wrist-R DG Complete: No results found for this or any previous visit.  Wrist-L DG Complete: No results found for this or any previous visit.   Hand Imaging: Hand-R DG Complete: No results found for this or any previous visit.  Hand-L DG Complete: No results found for this or any previous visit.   Complexity Note: Imaging results reviewed.                         Meds   Current Outpatient Medications:    acetaminophen  (TYLENOL ) 500 MG tablet, Take 2 tablets (1,000 mg total) by mouth every 6 (six) hours as needed., Disp: 100 tablet, Rfl: 2   Albuterol -Budesonide  (AIRSUPRA) 90-80 MCG/ACT AERO, , Disp: , Rfl:    Atogepant  (QULIPTA ) 30 MG TABS, Take 1 tablet daily, Disp: 30 tablet, Rfl: 11   busPIRone  (BUSPAR ) 7.5 MG tablet, Take 1 tablet (7.5 mg total) by mouth 2 (two) times daily., Disp: 60 tablet, Rfl: 0   cetirizine  (ZYRTEC ) 10 MG tablet, Take 10 mg by mouth at bedtime., Disp: , Rfl:    clonazePAM  (KLONOPIN ) 0.5 MG tablet, Take 1 tablet (0.5 mg total) by mouth 3 (three) times daily as needed for anxiety., Disp: 90 tablet, Rfl: 0   DULoxetine  (CYMBALTA ) 60 MG capsule, Take 2 capsules (120 mg total) by mouth daily., Disp: 180 capsule, Rfl: 0   eletriptan  (RELPAX ) 40 MG tablet, Take 1 tablet at onset of migraine. May repeat in 2 hours if headache persists or recurs. Do not take more than 3 a week, Disp: 10 tablet, Rfl: 5   empagliflozin  (JARDIANCE ) 25 MG TABS tablet, Take 25 mg by mouth., Disp: , Rfl:    famotidine  (PEPCID ) 40 MG  tablet, Take 1 tablet (40 mg total) by mouth 2 (two) times daily., Disp: 180 tablet, Rfl: 0   gabapentin  (NEURONTIN ) 600 MG tablet, Take 1.5  tablets (900 mg total) by mouth 3 (three) times daily for 14 days., Disp: 63 tablet, Rfl: 0   GEMTESA 75 MG TABS, Take 1 tablet by mouth daily., Disp: , Rfl:    ibuprofen  (ADVIL ) 800 MG tablet, Take 1 tablet (800 mg total) by mouth every 8 (eight) hours as needed., Disp: 30 tablet, Rfl: 0   JARDIANCE  25 MG TABS tablet, Take 1 tablet (25 mg total) by mouth every morning., Disp: 90 tablet, Rfl: 3   montelukast  (SINGULAIR ) 10 MG tablet, TAKE 1 TABLET BY MOUTH EVERYDAY AT BEDTIME, Disp: 90 tablet, Rfl: 0   naloxone  (NARCAN ) nasal spray 4 mg/0.1 mL, Place 1 spray into the nose as needed for up to 365 doses (for opioid-induced respiratory depresssion). In case of emergency (overdose), spray once into each nostril. If no response within 3 minutes, repeat application and call 911., Disp: 1 each, Rfl: 1   ondansetron  (ZOFRAN -ODT) 4 MG disintegrating tablet, Take 2 tablets (8 mg total) by mouth every 8 (eight) hours as needed for nausea or vomiting., Disp: 30 tablet, Rfl: 0   oxyCODONE  (OXY IR/ROXICODONE ) 5 MG immediate release tablet, Take 1 tablet (5 mg total) by mouth every 8 (eight) hours as needed for up to 3 days for severe pain (pain score 7-10). Must last 30 days., Disp: 9 tablet, Rfl: 0   pantoprazole  (PROTONIX ) 40 MG tablet, TAKE 1 TABLET BY MOUTH EVERY DAY IN THE MORNING, Disp: 90 tablet, Rfl: 1   potassium chloride  (MICRO-K ) 10 MEQ CR capsule, TAKE 1 CAPSULE BY MOUTH EVERY DAY, Disp: 90 capsule, Rfl: 3   QUEtiapine  (SEROQUEL ) 100 MG tablet, Take 100 mg by mouth 2 (two) times daily. Take 100 mg by mouth in AM and 200 mg by mouth in PM, Disp: , Rfl:    Semaglutide ,0.25 or 0.5MG /DOS, (OZEMPIC , 0.25 OR 0.5 MG/DOSE,) 2 MG/3ML SOPN, Inject 0.5 mg into the skin once a week., Disp: 3 mL, Rfl: 1   SEMAGLUTIDE ,0.25 OR 0.5MG /DOS, [place], Inject into the skin., Disp: , Rfl:    SYMBICORT 160-4.5 MCG/ACT inhaler, Inhale 2 puffs into the lungs., Disp: , Rfl:    tiZANidine  (ZANAFLEX ) 4 MG tablet, Take 1 tablet (4 mg  total) by mouth every 8 (eight) hours as needed., Disp: 270 tablet, Rfl: 3   traZODone  (DESYREL ) 150 MG tablet, Take 1 tablet (150 mg total) by mouth at bedtime., Disp: 90 tablet, Rfl: 1   zonisamide  (ZONEGRAN ) 100 MG capsule, Take 5 capsules every night, Disp: 150 capsule, Rfl: 11  ROS  Constitutional: Denies any fever or chills Gastrointestinal: No reported hemesis, hematochezia, vomiting, or acute GI distress Musculoskeletal: Denies any acute onset joint swelling, redness, loss of ROM, or weakness Neurological: No reported episodes of acute onset apraxia, aphasia, dysarthria, agnosia, amnesia, paralysis, loss of coordination, or loss of consciousness  Allergies  Ms. Nakatani is allergic to bactrim [sulfamethoxazole -trimethoprim] and codeine.  PFSH  Drug: Ms. Leising  reports no history of drug use. Alcohol :  reports no history of alcohol  use. Tobacco:  reports that she has never smoked. She has never used smokeless tobacco. 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 Surgical: Ms. Zinda  has a past surgical history that includes Cholecystectomy; LYMPH NODES REMOVED; ovarian cyst removed; Intrauterine device (iud) insertion (06/14/2014); Abdominal hysterectomy (N/A, 11/14/2015); Salpingoophorectomy (Bilateral,  11/14/2015); ORIF ankle fracture (Right, 05/03/2023); Ankle Fusion (Right, 05/13/2023); and Fracture surgery. Family: family history includes Breast cancer in her maternal grandmother; Depression in her maternal aunt and maternal aunt; Diabetes in her maternal aunt; Healthy in her brother and sister; Heart disease in her mother; Hypertension in her father and mother; Miscarriages / Stillbirths in her mother.  Constitutional Exam  General appearance: Well nourished, well developed, and well hydrated. In no apparent acute distress Vitals:   04/12/24 0810  BP: (!) 118/93  Pulse: 81  Resp: 16  Temp: (!) 97.4 F (36.3 C)  TempSrc: Temporal  SpO2: 98%  Weight: 270 lb (122.5 kg)  Height: 5' 11 (1.803 m)   BMI Assessment: Estimated body mass index is 37.66 kg/m as calculated from the following:   Height as of this encounter: 5' 11 (1.803 m).   Weight as of this encounter: 270 lb (122.5 kg).  BMI interpretation table: BMI level Category Range association with higher incidence of chronic pain  <18 kg/m2 Underweight   18.5-24.9 kg/m2 Ideal body weight   25-29.9 kg/m2 Overweight Increased incidence by 20%  30-34.9 kg/m2 Obese (Class I) Increased incidence by 68%  35-39.9 kg/m2 Severe obesity (Class II) Increased incidence by 136%  >40 kg/m2 Extreme obesity (Class III) Increased incidence by 254%   Patient's current BMI Ideal Body weight  Body mass index is 37.66 kg/m. Ideal body weight: 70.8 kg (156 lb 1.4 oz) Adjusted ideal body weight: 91.5 kg (201 lb 10.4 oz)   BMI Readings from Last 4 Encounters:  04/12/24 37.66 kg/m  04/08/24 38.35 kg/m  03/11/24 39.75 kg/m  01/19/24 39.75 kg/m   Wt Readings from Last 4 Encounters:  04/12/24 270 lb (122.5 kg)  04/08/24 275 lb (124.7 kg)  03/11/24 285 lb (129.3 kg)  01/19/24 285 lb (129.3 kg)    Psych/Mental status: Alert, oriented x 3 (person, place, & time)       Eyes: PERLA Respiratory: No evidence of acute respiratory  distress Exam: The patient comes in today with an external fixation device which is scheduled to be removed tomorrow.  She describes the pain as being above where the device is implanted.  The pain does not seem to follow any particular dermatomal or peripheral nerve distribution.  The pain is also not following the typical distribution seen with CRPS.  The patient does have swelling of the right foot area, which still goes along with the acute phase of her recovery.  Pain descriptors throughout follow [place] criteria for CRPS.  Assessment & Plan  Primary Diagnosis & Pertinent Problem List: The primary encounter diagnosis was Chronic ankle pain (1ry area of Pain) (Right). Diagnoses of Chronic postoperative pain of ankle (Right), Closed ankle fracture, sequela (Right), and Closed trimalleolar fracture of ankle, sequela (Right) were also pertinent to this visit. Visit Diagnosis: 1. Chronic ankle pain (1ry area of Pain) (Right)   2. Chronic postoperative pain of ankle (Right)   3. Closed ankle fracture, sequela (Right)   4. Closed trimalleolar fracture of ankle, sequela (Right)    Problems  updated and reviewed during this visit: No problems updated.  Plan of Care  Assessment and Plan    Acute right ankle and foot pain following trimalleolar fracture surgery   She experiences persistent severe pain in the right ankle and foot after trimalleolar fracture surgery on September 12th. The pain radiates up the leg and has not improved with gabapentin , ibuprofen , and acetaminophen . There is a risk of liver stress from acetaminophen , kidney stress from ibuprofen , and gabapentin  levels may be affected by kidney function. The external fixation device is scheduled for removal tomorrow. Acute pain management is the surgeon's responsibility, and chronic pain interventions are not indicated at this stage. Prescribe oxycodone  without acetaminophen  for temporary pain relief until the surgical follow-up. Send a note  to Dr. Ozell Holy at [facility] to remind him of the responsibility for acute pain management. Advise her to discuss ongoing pain management with the surgeon during the follow-up appointment.      Pharmacotherapy (Medications Ordered): Meds ordered this encounter  Medications   oxyCODONE  (OXY IR/ROXICODONE ) 5 MG immediate release tablet    Sig: Take 1 tablet (5 mg total) by mouth every 8 (eight) hours as needed for up to 3 days for severe pain (pain score 7-10). Must last 30 days.    Dispense:  9 tablet    Refill:  0    Chronic Pain: STOP Act (Not applicable) Fill 1 day early if closed on refill date. Avoid benzodiazepines within 8 hours of opioids   Procedure Orders    No procedure(s) ordered today   No orders of the defined types were placed in this encounter.  Lab Orders  No laboratory test(s) ordered today   Imaging Orders  No imaging studies ordered today   Referral Orders  No referral(s) requested today    Pharmacological management:  Opioid Analgesics: I will not be prescribing any opioids at this time Membrane stabilizer: I will not be prescribing any at this time Muscle relaxant: I will not be prescribing any at this time NSAID: I will not be prescribing any at this time Other analgesic(s): I will not be prescribing any at this time      Interventional Therapies  Risk Factors  Considerations  Medical Comorbidities:  MO (BMI>40)  T2NIDDM  Hx. Seizures  HTN  Hypotension w/ syncope  BA  PTSD  Bipolar Disorder  GAD  Hx. Panic Disorder  schizoaffective disorder  GERD  BNZ Use  Opioid Analgesic Use     Planned  Pending:      Under consideration:   None at this time. Patient is still having acute pain. Pain does not currently fit criteria for CRPS.   Completed: (Analgesic benefit)1  None at this time   Therapeutic  Palliative (PRN) options:   None established   Completed by other providers:   None reported  1(Analgesic benefit):  Expressed in percentage (%). (Local anesthetic[LA] +/- sedation  L.A.Local Anesthetic  Steroid benefit  Ongoing benefit)  Pharmacotherapy  Prescription sent to the pharmacy for Narcan  since she is taking benzodiazepines and opioids and has increased risk of respiratory depression secondary to a drug to drug interaction.        Provider-requested follow-up: Return if symptoms worsen or fail to improve. Recent Visits Date Type Provider Dept  01/19/24 Office Visit Tanya Glisson, MD [facility]  Showing recent visits within past 90 days and meeting all other requirements Today's Visits Date Type Provider Dept  04/12/24 Office Visit Tanya Glisson, MD [facility]  Showing today's visits and meeting all other requirements Future Appointments No visits were found meeting these conditions. Showing future appointments within next 90 days and meeting all other requirements   Primary Care Physician: Gretel App, NP  Duration of encounter: 37 minutes.  Total time on encounter, as per AMA guidelines included both the face-to-face and non-face-to-face time personally spent by the physician and/or other qualified health care professional(s) on the day of the encounter (includes time in activities that require the physician or other qualified health care professional and does not include time in activities normally performed by clinical staff). Physician's time may include the following activities when performed: Preparing to see the patient (e.g., pre-charting review of records, searching for previously ordered imaging, lab work, and nerve conduction tests) Review of prior analgesic pharmacotherapies. Reviewing PMP Interpreting ordered tests (e.g., lab work, imaging, nerve conduction tests) Performing post-procedure evaluations, including interpretation of diagnostic procedures Obtaining and/or reviewing separately obtained history Performing a medically appropriate  examination and/or evaluation Counseling and educating the patient/family/caregiver Ordering medications, tests, or procedures Referring and communicating with other health care professionals (when not separately reported) Documenting clinical information in the electronic or other health record Independently interpreting results (not separately reported) and communicating results to the patient/ family/caregiver Care coordination (not separately reported)  Note by: Eric DELENA Como, MD (TTS technology used. I apologize for any typographical errors that were not detected and corrected.) Date: 04/12/2024; Time: 9:10 AM

## 2024-04-12 ENCOUNTER — Telehealth: Payer: Self-pay

## 2024-04-12 ENCOUNTER — Ambulatory Visit: Payer: MEDICAID | Attending: Pain Medicine | Admitting: Pain Medicine

## 2024-04-12 ENCOUNTER — Encounter: Payer: Self-pay | Admitting: Pain Medicine

## 2024-04-12 VITALS — BP 118/93 | HR 81 | Temp 97.4°F | Resp 16 | Ht 71.0 in | Wt 270.0 lb

## 2024-04-12 DIAGNOSIS — S82851S Displaced trimalleolar fracture of right lower leg, sequela: Secondary | ICD-10-CM | POA: Diagnosis not present

## 2024-04-12 DIAGNOSIS — G8928 Other chronic postprocedural pain: Secondary | ICD-10-CM | POA: Insufficient documentation

## 2024-04-12 DIAGNOSIS — S82891S Other fracture of right lower leg, sequela: Secondary | ICD-10-CM | POA: Diagnosis present

## 2024-04-12 DIAGNOSIS — G8929 Other chronic pain: Secondary | ICD-10-CM | POA: Diagnosis present

## 2024-04-12 DIAGNOSIS — M25571 Pain in right ankle and joints of right foot: Secondary | ICD-10-CM | POA: Insufficient documentation

## 2024-04-12 MED ORDER — OXYCODONE HCL 5 MG PO TABS: 5.0000 mg | ORAL_TABLET | Freq: Three times a day (TID) | ORAL | 0 refills | Status: DC | PRN

## 2024-04-12 NOTE — Progress Notes (Signed)
 Safety precautions to be maintained throughout the outpatient stay will include: orient to surroundings, keep bed in low position, maintain call bell within reach at all times, provide assistance with transfer out of bed and ambulation.

## 2024-04-12 NOTE — Telephone Encounter (Signed)
 [facility] urology forms placed in provider to be signed folder

## 2024-04-15 ENCOUNTER — Telehealth: Payer: MEDICAID | Admitting: Physician Assistant

## 2024-04-15 DIAGNOSIS — R3989 Other symptoms and signs involving the genitourinary system: Secondary | ICD-10-CM | POA: Diagnosis not present

## 2024-04-15 MED ORDER — NITROFURANTOIN MONOHYD MACRO 100 MG PO CAPS: 100.0000 mg | ORAL_CAPSULE | Freq: Two times a day (BID) | ORAL | 0 refills | Status: DC

## 2024-04-15 NOTE — Patient Instructions (Signed)
 Andrea [name], thank you for joining Andrea CHRISTELLA Dickinson, Andrea [name] for today's virtual visit.  While this provider is not your primary care provider (PCP), if your PCP is located in our provider database this encounter information will be shared with them immediately following your visit.   A [facility] MyChart account gives you access to today's visit and all your visits, tests, and labs performed at [facility]  click here if you don't have a [facility] MyChart account or go to [email or web address]  Consent: (Patient) Andrea [name] provided verbal consent for this virtual visit at the beginning of the encounter.  Current Medications:  Current Outpatient Medications:    nitrofurantoin , macrocrystal-monohydrate, (MACROBID ) 100 MG capsule, Take 1 capsule (100 mg total) by mouth 2 (two) times daily., Disp: 10 capsule, Rfl: 0   acetaminophen  (TYLENOL ) 500 MG tablet, Take 2 tablets (1,000 mg total) by mouth every 6 (six) hours as needed., Disp: 100 tablet, Rfl: 2   Albuterol -Budesonide  (AIRSUPRA) 90-80 MCG/ACT AERO, , Disp: , Rfl:    Atogepant  (QULIPTA ) 30 MG TABS, Take 1 tablet daily, Disp: 30 tablet, Rfl: 11   busPIRone  (BUSPAR ) 7.5 MG tablet, Take 1 tablet (7.5 mg total) by mouth 2 (two) times daily., Disp: 60 tablet, Rfl: 0   cetirizine  (ZYRTEC ) 10 MG tablet, Take 10 mg by mouth at bedtime., Disp: , Rfl:    clonazePAM  (KLONOPIN ) 0.5 MG tablet, Take 1 tablet (0.5 mg total) by mouth 3 (three) times daily as needed for anxiety., Disp: 90 tablet, Rfl: 0   DULoxetine  (CYMBALTA ) 60 MG capsule, Take 2 capsules (120 mg total) by mouth daily., Disp: 180 capsule, Rfl: 0   eletriptan  (RELPAX ) 40 MG tablet, Take 1 tablet at onset of migraine. May repeat in 2 hours if headache persists or recurs. Do not take more than 3 a week, Disp: 10 tablet, Rfl: 5   empagliflozin  (JARDIANCE ) 25 MG TABS tablet, Take 25 mg by mouth., Disp: , Rfl:    famotidine  (PEPCID ) 40 MG tablet, Take 1 tablet  (40 mg total) by mouth 2 (two) times daily., Disp: 180 tablet, Rfl: 0   gabapentin  (NEURONTIN ) 600 MG tablet, Take 1.5 tablets (900 mg total) by mouth 3 (three) times daily for 14 days., Disp: 63 tablet, Rfl: 0   GEMTESA 75 MG TABS, Take 1 tablet by mouth daily., Disp: , Rfl:    ibuprofen  (ADVIL ) 800 MG tablet, Take 1 tablet (800 mg total) by mouth every 8 (eight) hours as needed., Disp: 30 tablet, Rfl: 0   JARDIANCE  25 MG TABS tablet, Take 1 tablet (25 mg total) by mouth every morning., Disp: 90 tablet, Rfl: 3   montelukast  (SINGULAIR ) 10 MG tablet, TAKE 1 TABLET BY MOUTH EVERYDAY AT BEDTIME, Disp: 90 tablet, Rfl: 0   naloxone  (NARCAN ) nasal spray 4 mg/0.1 mL, Place 1 spray into the nose as needed for up to 365 doses (for opioid-induced respiratory depresssion). In case of emergency (overdose), spray once into each nostril. If no response within 3 minutes, repeat application and call 911., Disp: 1 each, Rfl: 1   ondansetron  (ZOFRAN -ODT) 4 MG disintegrating tablet, Take 2 tablets (8 mg total) by mouth every 8 (eight) hours as needed for nausea or vomiting., Disp: 30 tablet, Rfl: 0   oxyCODONE  (OXY IR/ROXICODONE ) 5 MG immediate release tablet, Take 1 tablet (5 mg total) by mouth every 8 (eight) hours as needed for up to 3 days for severe pain (pain score 7-10). Must last 30 days., Disp: 9 tablet, Rfl:  0   pantoprazole  (PROTONIX ) 40 MG tablet, TAKE 1 TABLET BY MOUTH EVERY DAY IN THE MORNING, Disp: 90 tablet, Rfl: 1   potassium chloride  (MICRO-K ) 10 MEQ CR capsule, TAKE 1 CAPSULE BY MOUTH EVERY DAY, Disp: 90 capsule, Rfl: 3   QUEtiapine  (SEROQUEL ) 100 MG tablet, Take 100 mg by mouth 2 (two) times daily. Take 100 mg by mouth in AM and 200 mg by mouth in PM, Disp: , Rfl:    Semaglutide ,0.25 or 0.5MG /DOS, (OZEMPIC , 0.25 OR 0.5 MG/DOSE,) 2 MG/3ML SOPN, Inject 0.5 mg into the skin once a week., Disp: 3 mL, Rfl: 1   SEMAGLUTIDE ,0.25 OR 0.5MG /DOS, [place], Inject into the skin., Disp: , Rfl:    SYMBICORT 160-4.5  MCG/ACT inhaler, Inhale 2 puffs into the lungs., Disp: , Rfl:    tiZANidine  (ZANAFLEX ) 4 MG tablet, Take 1 tablet (4 mg total) by mouth every 8 (eight) hours as needed., Disp: 270 tablet, Rfl: 3   traZODone  (DESYREL ) 150 MG tablet, Take 1 tablet (150 mg total) by mouth at bedtime., Disp: 90 tablet, Rfl: 1   zonisamide  (ZONEGRAN ) 100 MG capsule, Take 5 capsules every night, Disp: 150 capsule, Rfl: 11   Medications ordered in this encounter:  Meds ordered this encounter  Medications   nitrofurantoin , macrocrystal-monohydrate, (MACROBID ) 100 MG capsule    Sig: Take 1 capsule (100 mg total) by mouth 2 (two) times daily.    Dispense:  10 capsule    Refill:  0    Supervising Provider:   BLAISE ALEENE KIDD [[id number]]     *If you need refills on other medications prior to your next appointment, please contact your pharmacy*  Follow-Up: Call back or seek an in-person evaluation if the symptoms worsen or if the condition fails to improve as anticipated.  [facility] [phone]  Other Instructions  Urinary Tract Infection, Female A urinary tract infection (UTI) is an infection in your urinary tract. The urinary tract is made up of organs that make, store, and get rid of pee (urine) in your body. These organs include: The kidneys. The ureters. The bladder. The urethra. What are the causes? Most UTIs are caused by germs called bacteria. They may be in or near your genitals. These germs grow and cause swelling in your urinary tract. What increases the risk? You're more likely to get a UTI if: You're a female. The urethra is shorter in females than in males. You have a soft tube called a catheter that drains your pee. You can't control when you pee or poop. You have trouble peeing because of: A kidney stone. A urinary blockage. A nerve condition that affects your bladder. Not getting enough to drink. You're sexually active. You use a birth control inside your vagina, like  spermicide. You're pregnant. You have low levels of the hormone estrogen in your body. You're an older adult. You're also more likely to get a UTI if you have other health problems. These may include: Diabetes. A weak immune system. Your immune system is your body's defense system. Sickle cell disease. Injury of the spine. What are the signs or symptoms? Symptoms may include: Needing to pee right away. Peeing small amounts often. Pain or burning when you pee. Blood in your pee. Pee that smells bad or odd. Pain in your belly or lower back. You may also: Feel confused. This may be the first symptom in older adults. Vomit. Not feel hungry. Feel tired or easily annoyed. Have a fever or chills. How is  this diagnosed? A UTI is diagnosed based on your medical history and an exam. You may also have other tests. These may include: Pee tests. Blood tests. Tests for sexually transmitted infections (STIs). If you've had more than one UTI, you may need to have imaging studies done to find out why you keep getting them. How is this treated? A UTI can be treated by: Taking antibiotics or other medicines. Drinking enough fluid to keep your pee pale yellow. In rare cases, a UTI can cause a very bad condition called sepsis. Sepsis may be treated in the hospital. Follow these instructions at home: Medicines Take your medicines only as told by your health care provider. If you were given antibiotics, take them as told by your provider. Do not stop taking them even if you start to feel better. General instructions Make sure you: Pee often and fully. Do not hold your pee for a long time. Wipe from front to back after you pee or poop. Use each tissue only once when you wipe. Pee after you have sex. Do not douche or use sprays or powders in your genital area. Contact a health care provider if: Your symptoms don't get better after 1-2 days of taking antibiotics. Your symptoms go away and then  come back. You have a fever or chills. You vomit or feel like you may vomit. Get help right away if: You have very bad pain in your back or lower belly. You faint. This information is not intended to replace advice given to you by your health care provider. Make sure you discuss any questions you have with your health care provider. Document Revised: 05/14/2023 Document Reviewed: 09/06/2022 Elsevier Patient Education  [employer].   If you have been instructed to have an in-person evaluation today at a local Urgent Care facility, please use the link below. It will take you to a list of all of our available [facility], including address, phone number and hours of operation. Please do not delay care.  [facility]  If you or a family member do not have a primary care provider, use the link below to schedule a visit and establish care. When you choose a [facility] primary care physician or advanced practice provider, you gain a long-term partner in health. Find a Primary Care Provider  Learn more about [facility] in-office and virtual care options: [facility] - Get Care Now

## 2024-04-15 NOTE — Progress Notes (Signed)
 Virtual Visit Consent   Andrea [name], you are scheduled for a virtual visit with a [facility] provider today. Just as with appointments in the office, your consent must be obtained to participate. Your consent will be active for this visit and any virtual visit you may have with one of our providers in the next 365 days. If you have a MyChart account, a copy of this consent can be sent to you electronically.  As this is a virtual visit, video technology does not allow for your provider to perform a traditional examination. This may limit your provider's ability to fully assess your condition. If your provider identifies any concerns that need to be evaluated in person or the need to arrange testing (such as labs, EKG, etc.), we will make arrangements to do so. Although advances in technology are sophisticated, we cannot ensure that it will always work on either your end or our end. If the connection with a video visit is poor, the visit may have to be switched to a telephone visit. With either a video or telephone visit, we are not always able to ensure that we have a secure connection.  By engaging in this virtual visit, you consent to the provision of healthcare and authorize for your insurance to be billed (if applicable) for the services provided during this visit. Depending on your insurance coverage, you may receive a charge related to this service.  I need to obtain your verbal consent now. Are you willing to proceed with your visit today? Andrea [name] has provided verbal consent on 04/15/2024 for a virtual visit (video or telephone). Andrea CHRISTELLA Dickinson, PA-C  Date: 04/15/2024 6:24 PM   Virtual Visit via Video Note   I, Andrea [name], connected with  Andrea [name]  ([id number], 03-Dec-1980) on 04/15/24 at  6:15 PM EDT by a video-enabled telemedicine application and verified that I am speaking with the correct person using two identifiers.  Location: Patient: Virtual  Visit Location Patient: Home Provider: Virtual Visit Location Provider: Home Office   I discussed the limitations of evaluation and management by telemedicine and the availability of in person appointments. The patient expressed understanding and agreed to proceed.    History of Present Illness: Andrea [name] is a 43 y.o. who identifies as a female who was assigned female at birth, and is being seen today for dysuria.  HPI: Urinary Tract Infection  This is a new problem. The problem occurs every urination. The problem has been gradually worsening. The quality of the pain is described as burning. The pain is mild. There has been no fever (99, but recently had surgery). Associated symptoms include frequency, hematuria (today, on TP with wiping), hesitancy, nausea (yesterday, now resolved), sweats (today after a nap) and urgency. Pertinent negatives include no chills, discharge, flank pain or vomiting. She has tried acetaminophen  and NSAIDs for the symptoms. The treatment provided no relief. Her past medical history is significant for catheterization. recent surgery on ankle     Problems:  Patient Active Problem List   Diagnosis Date Noted   Urinary incontinence 03/11/2024   UTI symptoms 01/26/2024   Long term prescription benzodiazepine use 01/19/2024   Long-term current use of opiate analgesic 01/19/2024   Chronic prescription opiate use 01/19/2024   Closed ankle fracture, sequela (Right) 01/19/2024   Chronic postoperative pain of ankle (Right) 01/19/2024   Chronic pain syndrome 01/18/2024   Pharmacologic therapy 01/18/2024   Disorder of skeletal system 01/18/2024   Problems  influencing health status 01/18/2024   Delayed union of ankle fracture, closed (Right) 01/14/2024   Fall at home, sequela 01/14/2024   Neuropathic ulcer of heel, limited to breakdown of skin (HCC) (Right) 01/14/2024   Elevated blood pressure reading 12/26/2023   Schizoaffective disorder, bipolar type (HCC)  12/05/2023   Diabetic nephropathy associated with type 2 diabetes mellitus (HCC) 07/23/2023   Acute right-sided low back pain without sciatica 07/18/2023   Acute flank pain (Right) 07/18/2023   Fluctuating blood pressure 07/18/2023   Hypotension 07/02/2023   Occasional tremors 07/02/2023   Constipation 07/02/2023   Syncope due to orthostatic hypotension 06/20/2023   Closed trimalleolar fracture of ankle, sequela (Right) 05/20/2023   Bipolar affective disorder, mixed, severe, with psychotic behavior (HCC) 05/20/2023   Panic disorder 05/20/2023   History of removal of internal fixation device 05/20/2023   Moderate recurrent major depression (HCC) 05/20/2023   Primary localized osteoarthrosis of ankle and foot 05/20/2023   Schizophrenia (HCC) 05/20/2023   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) 05/20/2023   Complication of procedure 05/20/2023   Acquired varus deformity of ankle (Right) 05/13/2023   Postoperative surgical complication involving musculoskeletal system associated with musculoskeletal procedure 05/12/2023   Bipolar 1 disorder (HCC) 05/03/2023   Type 2 diabetes mellitus with peripheral neuropathy (HCC) 05/03/2023   Essential hypertension 05/03/2023   Mild persistent asthma 05/03/2023   Pre-syncope 05/03/2023   Chronic ankle pain (1ry area of Pain) (Right) 07/16/2022   Chronic post-traumatic stress disorder 03/13/2022   Generalized anxiety disorder with panic attacks 03/13/2022   Major depressive disorder, 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 03/13/2022   Other insomnia 03/13/2022   Long term current use of antipsychotic medication 03/13/2022   Convulsion (HCC) 11/26/2021   HTN (hypertension) 09/18/2016   Acute pyelonephritis 01/16/2013   Migraines 10/30/2012   Obesity, Class III, BMI 40-49.9 (morbid obesity) (HCC) 10/30/2012   Kidney stones      Allergies:  Allergies  Allergen Reactions   Bactrim [Sulfamethoxazole -Trimethoprim] Hives and Itching   Codeine Hives and Itching   Medications:  Current Outpatient Medications:    nitrofurantoin , macrocrystal-monohydrate, (MACROBID ) 100 MG capsule, Take 1 capsule (100 mg total) by mouth 2 (two) times daily., Disp: 10 capsule, Rfl: 0   acetaminophen  (TYLENOL ) 500 MG tablet, Take 2 tablets (1,000 mg total) by mouth every 6 (six) hours as needed., Disp: 100 tablet, Rfl: 2   Albuterol -Budesonide  (AIRSUPRA) 90-80 MCG/ACT AERO, , Disp: , Rfl:    Atogepant  (QULIPTA ) 30 MG TABS, Take 1 tablet daily, Disp: 30 tablet, Rfl: 11   busPIRone  (BUSPAR ) 7.5 MG tablet, Take 1 tablet (7.5 mg total) by mouth 2 (two) times daily., Disp: 60 tablet, Rfl: 0   cetirizine  (ZYRTEC ) 10 MG tablet, Take 10 mg by mouth at bedtime., Disp: , Rfl:    clonazePAM  (KLONOPIN ) 0.5 MG tablet, Take 1 tablet (0.5 mg total) by mouth 3 (three) times daily as needed for anxiety., Disp: 90 tablet, Rfl: 0   DULoxetine  (CYMBALTA ) 60 MG capsule, Take 2 capsules (120 mg total) by mouth daily., Disp: 180 capsule, Rfl: 0   eletriptan  (RELPAX ) 40 MG tablet, Take 1 tablet at onset of migraine. May repeat in 2 hours if headache persists or recurs. Do not take more than 3 a week, Disp: 10 tablet, Rfl: 5   empagliflozin  (JARDIANCE ) 25 MG TABS tablet, Take 25 mg by mouth., Disp: , Rfl:    famotidine  (  PEPCID ) 40 MG tablet, Take 1 tablet (40 mg total) by mouth 2 (two) times daily., Disp: 180 tablet, Rfl: 0   gabapentin  (NEURONTIN ) 600 MG tablet, Take 1.5 tablets (900 mg total) by mouth 3 (three) times daily for 14 days., Disp: 63 tablet, Rfl: 0   GEMTESA 75 MG TABS, Take 1 tablet by mouth daily., Disp: , Rfl:    ibuprofen  (ADVIL ) 800 MG tablet, Take 1 tablet (800 mg total) by mouth every 8 (eight) hours as needed., Disp: 30 tablet, Rfl: 0   JARDIANCE  25 MG TABS tablet, Take 1 tablet (25 mg total) by mouth every morning., Disp: 90 tablet, Rfl: 3    montelukast  (SINGULAIR ) 10 MG tablet, TAKE 1 TABLET BY MOUTH EVERYDAY AT BEDTIME, Disp: 90 tablet, Rfl: 0   naloxone  (NARCAN ) nasal spray 4 mg/0.1 mL, Place 1 spray into the nose as needed for up to 365 doses (for opioid-induced respiratory depresssion). In case of emergency (overdose), spray once into each nostril. If no response within 3 minutes, repeat application and call 911., Disp: 1 each, Rfl: 1   ondansetron  (ZOFRAN -ODT) 4 MG disintegrating tablet, Take 2 tablets (8 mg total) by mouth every 8 (eight) hours as needed for nausea or vomiting., Disp: 30 tablet, Rfl: 0   oxyCODONE  (OXY IR/ROXICODONE ) 5 MG immediate release tablet, Take 1 tablet (5 mg total) by mouth every 8 (eight) hours as needed for up to 3 days for severe pain (pain score 7-10). Must last 30 days., Disp: 9 tablet, Rfl: 0   pantoprazole  (PROTONIX ) 40 MG tablet, TAKE 1 TABLET BY MOUTH EVERY DAY IN THE MORNING, Disp: 90 tablet, Rfl: 1   potassium chloride  (MICRO-K ) 10 MEQ CR capsule, TAKE 1 CAPSULE BY MOUTH EVERY DAY, Disp: 90 capsule, Rfl: 3   QUEtiapine  (SEROQUEL ) 100 MG tablet, Take 100 mg by mouth 2 (two) times daily. Take 100 mg by mouth in AM and 200 mg by mouth in PM, Disp: , Rfl:    Semaglutide ,0.25 or 0.5MG /DOS, (OZEMPIC , 0.25 OR 0.5 MG/DOSE,) 2 MG/3ML SOPN, Inject 0.5 mg into the skin once a week., Disp: 3 mL, Rfl: 1   SEMAGLUTIDE ,0.25 OR 0.5MG /DOS, [place], Inject into the skin., Disp: , Rfl:    SYMBICORT 160-4.5 MCG/ACT inhaler, Inhale 2 puffs into the lungs., Disp: , Rfl:    tiZANidine  (ZANAFLEX ) 4 MG tablet, Take 1 tablet (4 mg total) by mouth every 8 (eight) hours as needed., Disp: 270 tablet, Rfl: 3   traZODone  (DESYREL ) 150 MG tablet, Take 1 tablet (150 mg total) by mouth at bedtime., Disp: 90 tablet, Rfl: 1   zonisamide  (ZONEGRAN ) 100 MG capsule, Take 5 capsules every night, Disp: 150 capsule, Rfl: 11  Observations/Objective: Patient is well-developed, well-nourished in no acute distress.  Resting comfortably at home.   Head is normocephalic, atraumatic.  No labored breathing.  Speech is clear and coherent with logical content.  Patient is alert and oriented at baseline.    Assessment and Plan: 1. Suspected UTI (Primary) - nitrofurantoin , macrocrystal-monohydrate, (MACROBID ) 100 MG capsule; Take 1 capsule (100 mg total) by mouth 2 (two) times daily.  Dispense: 10 capsule; Refill: 0  - Worsening symptoms.  - Will treat empirically with Macrobid  - May use AZO for bladder spasms - Continue to push fluids.  - Seek in person evaluation for urine culture if symptoms do not improve or if they worsen.    Follow Up Instructions: I discussed the assessment and treatment plan with the patient. The patient was provided an opportunity to  ask questions and all were answered. The patient agreed with the plan and demonstrated an understanding of the instructions.  A copy of instructions were sent to the patient via MyChart unless otherwise noted below.    The patient was advised to call back or seek an in-person evaluation if the symptoms worsen or if the condition fails to improve as anticipated.    Andrea CHRISTELLA Dickinson, PA-C

## 2024-04-15 NOTE — Telephone Encounter (Signed)
 Form faxed

## 2024-04-15 NOTE — Telephone Encounter (Signed)
 Copied from CRM [id number]. Topic: General - Other >> Apr 15, 2024  3:51 PM Aisha D wrote: Reason for CRM:  Amy with [employer] called back to state that order was faxed and received. However, the certificate of medical necessity was not signed and dated at the bottom.  Amy stated that she will fax over a new document to be filled out. Fax number: [phone] - call back number  UPDATE: Amy is calling back to speak with someone in regards to this. Amy stated that the faxed over documents twice and they have came back incorrect or not signed. Amy also stated that they have not received the certificate of medical necessity.Amy would like a callback.

## 2024-04-16 NOTE — Telephone Encounter (Signed)
 Form faxed

## 2024-04-19 ENCOUNTER — Ambulatory Visit: Payer: Self-pay

## 2024-04-19 ENCOUNTER — Telehealth: Payer: Self-pay

## 2024-04-19 NOTE — Telephone Encounter (Signed)
 Pt is scheduled to see Kenney Roys tomorrow.

## 2024-04-19 NOTE — Telephone Encounter (Signed)
 FYI Only or Action Required?: FYI only for provider: appointment scheduled on 04/20/2024.  Patient was last seen in primary care on 04/15/2024 by Vivienne Delon HERO, PA-C.  Called Nurse Triage reporting No chief complaint on file..  Symptoms began several days ago.  Interventions attempted: Prescription medications: macrobid , had virtual UC appt.  Symptoms are: unchanged.  Triage Disposition: See Physician Within 24 Hours  Patient/caregiver understands and will follow disposition?: Yes  Copied from CRM #[id number]. Topic: Clinical - Red Word Triage >> Apr 19, 2024  4:55 PM Nessti S wrote: [facility] that prompted transfer to Nurse Triage: pain when urinates   ----------------------------------------------------------------------- From previous Reason for Contact - Scheduling: Patient/patient representative is calling to schedule an appointment. Refer to attachments for appointment information. Reason for Disposition  All other patients with painful urination  (Exception: [1] EITHER frequency or urgency AND [2] has on-call doctor.)  Answer Assessment - Initial Assessment Questions 1. SYMPTOM: What's the main symptom you're concerned about? (e.g., frequency, incontinence)     Burning, frequency 2. ONSET: When did the  burning  start?     Four days ago 3. PAIN: Is there any pain? If Yes, ask: How bad is it? (Scale: 1-10; mild, moderate, severe)     7.5/10 4. CAUSE: What do you think is causing the symptoms?     UTI 5. OTHER SYMPTOMS: Do you have any other symptoms? (e.g., blood in urine, fever, flank pain, pain with urination)     Frequency, abdominal pain 6. PREGNANCY: Is there any chance you are pregnant? When was your last menstrual period?     N/a  States that she had virtual appt and was placed on macrobid , but continues to have burning  Answer Assessment - Initial Assessment Questions 1. SEVERITY: How bad is the pain?  (e.g., Scale 1-10; mild, moderate, or  severe)     7.5/10 2. FREQUENCY: How many times have you had painful urination today?      Every urination  4. ONSET: When did the painful urination start?      Four days ago 5. FEVER: Do you have a fever? If Yes, ask: What is your temperature, how was it measured, and when did it start?     denies  7. CAUSE: What do you think is causing the painful urination?  (e.g., UTI, scratch, Herpes sore)     UTI 8. OTHER SYMPTOMS: Do you have any other symptoms? (e.g., blood in urine, flank pain, genital sores, urgency, vaginal discharge)     Abd pain States that she had  a virtual appt. Recently and was prescribed macrobid , but still has burning and urgency  Protocols used: Urinary Symptoms-A-AH, Urination Pain - Female-A-AH

## 2024-04-19 NOTE — Telephone Encounter (Signed)
 Copied from CRM #[id number]. Topic: Clinical - Medical Advice >> Apr 19, 2024  4:55 PM Nessti S wrote: Reason for CRM: pt said surgeon to increase bp meds

## 2024-04-20 ENCOUNTER — Encounter: Payer: Self-pay | Admitting: Family

## 2024-04-20 ENCOUNTER — Ambulatory Visit: Payer: MEDICAID | Admitting: Family

## 2024-04-20 VITALS — BP 130/78 | HR 83 | Temp 98.5°F | Ht 71.0 in | Wt 270.0 lb

## 2024-04-20 DIAGNOSIS — R300 Dysuria: Secondary | ICD-10-CM

## 2024-04-20 DIAGNOSIS — A499 Bacterial infection, unspecified: Secondary | ICD-10-CM

## 2024-04-20 DIAGNOSIS — N390 Urinary tract infection, site not specified: Secondary | ICD-10-CM

## 2024-04-20 LAB — POCT URINALYSIS DIPSTICK
Glucose, UA: POSITIVE — AB
Leukocytes, UA: NEGATIVE
Nitrite, UA: POSITIVE
Protein, UA: POSITIVE — AB
Spec Grav, UA: 1.025 (ref 1.010–1.025)
Urobilinogen, UA: 1 U/dL
pH, UA: 5 (ref 5.0–8.0)

## 2024-04-20 MED ORDER — CIPROFLOXACIN HCL 250 MG PO TABS: 250.0000 mg | ORAL_TABLET | Freq: Two times a day (BID) | ORAL | 0 refills | Status: AC

## 2024-04-20 NOTE — Progress Notes (Signed)
 Acute Office Visit  Subjective:     Patient ID: Andrea [name], female    DOB: [birth year], 43 y.o.   MRN: [id number]  Chief Complaint  Patient presents with   Urinary Tract Infection    HPI Patient is in today  with complaints of urinary frequency, urgency, and burning.  She is currently on Macrobid  for urinary tract infection that has not helped her symptoms.  Has lower pelvic pain but denies any back pain.  No fever or chills.  Patient sustained an ankle fracture 02/27/2024 and has been under the care of orthopedics.  She had surgery but is still having difficulty with pain management due to the fracture and neuropathy.  She would like for her primary care provider to consider taking over management of the gabapentin  and the oxycodone  as her surgeon has declined further refills.  Review of Systems  Genitourinary:  Positive for dysuria, frequency and urgency.  Musculoskeletal:        Right ankle pain from fracture-September 2025  Neurological: Negative.   Psychiatric/Behavioral: Negative.    All other systems reviewed and are negative.  Past Medical History:  Diagnosis Date   Allergy    Anemia    Anxiety    Arthritis    Asthma    Bipolar 1 disorder (HCC)    Bipolar 1 disorder, mixed, severe (HCC) 12/20/2016   Bipolar I disorder, most recent episode depressed (HCC) 12/20/2016   Complication of anesthesia    hard time waking up    Depression    Diabetes mellitus without complication (HCC)    Drug induced akathisia 04/02/2022   GERD (gastroesophageal reflux disease)    no meds   Hypertension    Kidney stones    Long term current use of antipsychotic medication 03/13/2022   Low back pain    Migraines    Neuromuscular disorder (HCC)    PCOS (polycystic ovarian syndrome)    PONV (postoperative nausea and vomiting)    Schizophrenia (HCC)    Seizure (HCC) 10/03/2021   Seizures (HCC) 06/04/2016   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   Shortness of  breath dyspnea    with bronchitis    Social History   Socioeconomic History   Marital status: Divorced    Spouse name: Not on file   Number of children: 0   Years of education: 12   Highest education level: Some college, no degree  Occupational History   Not on file  Tobacco Use   Smoking status: Never   Smokeless tobacco: Never  Vaping Use   Vaping status: Never Used  Substance and Sexual Activity   Alcohol  use: Never   Drug use: Never   Sexual activity: Yes    Partners: Male  Other Topics Concern   Not on file  Social History Narrative   Right handed   Drinks caffeine   One story home   Social Drivers of Health   Financial Resource Strain: Medium Risk (04/19/2024)   Overall Financial Resource Strain (CARDIA)    Difficulty of Paying Living Expenses: Somewhat hard  Food Insecurity: No Food Insecurity (04/19/2024)   Hunger Vital Sign    Worried About Running Out of Food in the Last Year: Never true    Ran Out of Food in the Last Year: Never true  Transportation Needs: Unmet Transportation Needs (04/19/2024)   PRAPARE - Administrator, Civil Service (Medical): Yes    Lack of Transportation (Non-Medical): No  Physical Activity:  Inactive (04/19/2024)   Exercise Vital Sign    Days of Exercise per Week: 0 days    Minutes of Exercise per Session: Not on file  Stress: Stress Concern Present (04/19/2024)   [employer] of Occupational Health - Occupational Stress Questionnaire    Feeling of Stress: Very much  Social Connections: Socially Integrated (04/19/2024)   Social Connection and Isolation Panel    Frequency of Communication with Friends and Family: More than three times a week    Frequency of Social Gatherings with Friends and Family: Once a week    Attends Religious Services: More than 4 times per year    Active Member of Clubs or Organizations: Yes    Attends Banker Meetings: More than 4 times per year    Marital Status: Living with  partner  Intimate Partner Violence: Not At Risk (05/12/2023)   Humiliation, Afraid, Rape, and Kick questionnaire    Fear of Current or Ex-Partner: No    Emotionally Abused: No    Physically Abused: No    Sexually Abused: No    Past Surgical History:  Procedure Laterality Date   ABDOMINAL HYSTERECTOMY N/A 11/14/2015   Procedure: HYSTERECTOMY ABDOMINAL;  Surgeon: Oneil FORBES Piety, MD;  Location: [facility];  Service: Gynecology;  Laterality: N/A;   ANKLE FUSION Right 05/13/2023   Procedure: ARTHRODESIS ANKLE;  Surgeon: Malvin Marsa FALCON, DPM;  Location: [facility];  Service: Orthopedics/Podiatry;  Laterality: Right;  Hardware removal, right ankle tibiot talo calcaneal arthrodesis   CHOLECYSTECTOMY     FRACTURE SURGERY     INTRAUTERINE DEVICE (IUD) INSERTION  06/14/2014   [facility]   LYMPH NODES REMOVED     ORIF ANKLE FRACTURE Right 05/03/2023   Procedure: OPEN REDUCTION INTERNAL FIXATION (ORIF) ANKLE FRACTURE;  Surgeon: Silva Juliene SAUNDERS, DPM;  Location: [facility];  Service: Orthopedics/Podiatry;  Laterality: Right;   ovarian cyst removed     SALPINGOOPHORECTOMY Bilateral 11/14/2015   Procedure: SALPINGO OOPHORECTOMY;  Surgeon: Oneil FORBES Piety, MD;  Location: [facility];  Service: Gynecology;  Laterality: Bilateral;    Family History  Problem Relation Age of Onset   Miscarriages / Stillbirths Mother    Hypertension Mother    Heart disease Mother    Hypertension Father    Healthy Sister    Depression Maternal Aunt    Depression Maternal Aunt    Diabetes Maternal Aunt    Breast cancer Maternal Grandmother    Healthy Brother     Allergies  Allergen Reactions   Bactrim [Sulfamethoxazole -Trimethoprim] Hives and Itching   Codeine Hives and Itching    Current Outpatient Medications on File Prior to Visit  Medication Sig Dispense Refill   Albuterol -Budesonide  (AIRSUPRA) 90-80 MCG/ACT AERO      Atogepant  (QULIPTA ) 30 MG TABS Take 1 tablet daily 30 tablet 11   busPIRone   (BUSPAR ) 7.5 MG tablet Take 1 tablet (7.5 mg total) by mouth 2 (two) times daily. 60 tablet 0   cetirizine  (ZYRTEC ) 10 MG tablet Take 10 mg by mouth at bedtime.     clonazePAM  (KLONOPIN ) 0.5 MG tablet Take 1 tablet (0.5 mg total) by mouth 3 (three) times daily as needed for anxiety. 90 tablet 0   DULoxetine  (CYMBALTA ) 60 MG capsule Take 2 capsules (120 mg total) by mouth daily. 180 capsule 0   eletriptan  (RELPAX ) 40 MG tablet Take 1 tablet at onset of migraine. May repeat in 2 hours if headache persists or recurs. Do not take more than 3 a week 10  tablet 5   empagliflozin  (JARDIANCE ) 25 MG TABS tablet Take 25 mg by mouth.     famotidine  (PEPCID ) 40 MG tablet Take 1 tablet (40 mg total) by mouth 2 (two) times daily. 180 tablet 0   gabapentin  (NEURONTIN ) 600 MG tablet Take 1.5 tablets (900 mg total) by mouth 3 (three) times daily for 14 days. 63 tablet 0   GEMTESA 75 MG TABS Take 1 tablet by mouth daily.     ibuprofen  (ADVIL ) 800 MG tablet Take 1 tablet (800 mg total) by mouth every 8 (eight) hours as needed. 30 tablet 0   JARDIANCE  25 MG TABS tablet Take 1 tablet (25 mg total) by mouth every morning. 90 tablet 3   montelukast  (SINGULAIR ) 10 MG tablet TAKE 1 TABLET BY MOUTH EVERYDAY AT BEDTIME 90 tablet 0   naloxone  (NARCAN ) nasal spray 4 mg/0.1 mL Place 1 spray into the nose as needed for up to 365 doses (for opioid-induced respiratory depresssion). In case of emergency (overdose), spray once into each nostril. If no response within 3 minutes, repeat application and call 911. 1 each 1   nitrofurantoin , macrocrystal-monohydrate, (MACROBID ) 100 MG capsule Take 1 capsule (100 mg total) by mouth 2 (two) times daily. 10 capsule 0   ondansetron  (ZOFRAN -ODT) 4 MG disintegrating tablet Take 2 tablets (8 mg total) by mouth every 8 (eight) hours as needed for nausea or vomiting. 30 tablet 0   pantoprazole  (PROTONIX ) 40 MG tablet TAKE 1 TABLET BY MOUTH EVERY DAY IN THE MORNING 90 tablet 1   potassium chloride   (MICRO-K ) 10 MEQ CR capsule TAKE 1 CAPSULE BY MOUTH EVERY DAY 90 capsule 3   QUEtiapine  (SEROQUEL ) 100 MG tablet Take 100 mg by mouth 2 (two) times daily. Take 100 mg by mouth in AM and 200 mg by mouth in PM     Semaglutide ,0.25 or 0.5MG /DOS, (OZEMPIC , 0.25 OR 0.5 MG/DOSE,) 2 MG/3ML SOPN Inject 0.5 mg into the skin once a week. 3 mL 1   SEMAGLUTIDE ,0.25 OR 0.5MG /DOS, Roberts Inject into the skin.     SYMBICORT 160-4.5 MCG/ACT inhaler Inhale 2 puffs into the lungs.     tiZANidine  (ZANAFLEX ) 4 MG tablet Take 1 tablet (4 mg total) by mouth every 8 (eight) hours as needed. 270 tablet 3   traZODone  (DESYREL ) 150 MG tablet Take 1 tablet (150 mg total) by mouth at bedtime. 90 tablet 1   zonisamide  (ZONEGRAN ) 100 MG capsule Take 5 capsules every night 150 capsule 11   acetaminophen  (TYLENOL ) 500 MG tablet Take 2 tablets (1,000 mg total) by mouth every 6 (six) hours as needed. (Patient not taking: Reported on 04/20/2024) 100 tablet 2   oxyCODONE  (OXY IR/ROXICODONE ) 5 MG immediate release tablet Take 1 tablet (5 mg total) by mouth every 8 (eight) hours as needed for up to 3 days for severe pain (pain score 7-10). Must last 30 days. (Patient not taking: Reported on 04/20/2024) 9 tablet 0   No current facility-administered medications on file prior to visit.    BP 130/78   Pulse 83   Temp 98.5 F (36.9 C) (Oral)   Ht 5' 11 (1.803 m)   Wt 270 lb (122.5 kg)   LMP  (LMP Unknown)   SpO2 95%   BMI 37.66 kg/m chart      Objective:    BP 130/78   Pulse 83   Temp 98.5 F (36.9 C) (Oral)   Ht 5' 11 (1.803 m)   Wt 270 lb (122.5 kg)   LMP  (  LMP Unknown)   SpO2 95%   BMI 37.66 kg/m    Physical Exam Vitals reviewed.  Constitutional:      Appearance: Normal appearance.  Cardiovascular:     Rate and Rhythm: Normal rate and regular rhythm.  Pulmonary:     Effort: Pulmonary effort is normal.     Breath sounds: Normal breath sounds.  Abdominal:     General: Abdomen is flat. Bowel sounds are normal.      Palpations: Abdomen is soft.     Tenderness: There is abdominal tenderness.  Musculoskeletal:     Cervical back: Normal range of motion and neck supple.     Comments: Patient had a Automotive engineer.  Skin:    General: Skin is warm and dry.  Neurological:     General: No focal deficit present.     Mental Status: She is alert and oriented to person, place, and time. Mental status is at baseline.  Psychiatric:        Mood and Affect: Mood normal.        Behavior: Behavior normal.     Results for orders placed or performed in visit on 04/20/24  POCT Urinalysis Dipstick  Result Value Ref Range   Color, UA other    Clarity, UA turbid    Glucose, UA Positive (A) Negative   Bilirubin, UA small    Ketones, UA trace    Spec Grav, UA 1.025 1.010 - 1.025   Blood, UA small    pH, UA 5.0 5.0 - 8.0   Protein, UA Positive (A) Negative   Urobilinogen, UA 1.0 0.2 or 1.0 E.U./dL   Nitrite, UA positive    Leukocytes, UA Negative Negative   Appearance     Odor          Assessment & Plan:   Problem List Items Addressed This Visit   None Visit Diagnoses       Dysuria    -  Primary   Relevant Orders   Urine Culture   Urinalysis, Routine w reflex microscopic     Bacterial urinary tract infection       Relevant Orders   POCT Urinalysis Dipstick (Completed)       Meds ordered this encounter  Medications   ciprofloxacin  (CIPRO ) 250 MG tablet    Sig: Take 1 tablet (250 mg total) by mouth 2 (two) times daily for 5 days.    Dispense:  10 tablet    Refill:  0   Urine culture sent will notify patient pending results.  Will discuss medication management with her PCP to determine if this is something she would like to manage here.  If not, we could always do a referral to pain management.  Call the office if symptoms worsen or persist.  Recheck as scheduled and sooner as needed. No follow-ups on file.  Cambrea Kirt B Maryetta Shafer, FNP

## 2024-04-21 ENCOUNTER — Encounter: Payer: Self-pay | Admitting: Nurse Practitioner

## 2024-04-21 ENCOUNTER — Other Ambulatory Visit: Payer: Self-pay | Admitting: Podiatry

## 2024-04-21 DIAGNOSIS — G8928 Other chronic postprocedural pain: Secondary | ICD-10-CM

## 2024-04-21 LAB — URINE CULTURE
MICRO NUMBER:: 17188501
Result:: NO GROWTH
SPECIMEN QUALITY:: ADEQUATE

## 2024-04-22 ENCOUNTER — Ambulatory Visit: Payer: Self-pay | Admitting: Family

## 2024-04-22 NOTE — Telephone Encounter (Signed)
 A new [employer] urology form placed in provider to be signed folder

## 2024-04-23 ENCOUNTER — Telehealth: Payer: Self-pay | Admitting: Pain Medicine

## 2024-04-23 ENCOUNTER — Telehealth: Payer: MEDICAID | Admitting: Pain Medicine

## 2024-04-23 ENCOUNTER — Ambulatory Visit: Payer: Self-pay

## 2024-04-23 NOTE — Telephone Encounter (Signed)
 FYI Only or Action Required?: FYI only for provider: Pt going to UC d/t new onset flank pain, no improvement in dysuria and hx of DM and kidney stones.  Patient was last seen in primary care on 04/20/2024 by Douglass Kenney NOVAK, FNP.  Called Nurse Triage reporting Dysuria.  Symptoms began several days ago.  Interventions attempted: Prescription medications: ciprofloxacin .  Symptoms are: gradually worsening.  Triage Disposition: See HCP Within 4 Hours (Or PCP Triage)  Patient/caregiver understands and will follow disposition?: Yes Reason for Disposition  [1] Side (flank) or lower back pain AND [2] new-onset since starting antibiotics  Answer Assessment - Initial Assessment Questions Pt reports no improvement since visit on 03/20/24 for poss UTI. Has been taking the ciprofloxacin  as directed, today is last day. Currently experiencing burning with urination as well as urgency.   Denies fever or vomiting, but decreased appetite d/t nausea.   1. MAIN SYMPTOM: What is the main symptom you are concerned about? (e.g., painful urination, urine frequency)     Burning with urination  2. BETTER-SAME-WORSE: Are you getting better, staying the same, or getting worse compared to how you felt at your last visit to the doctor (most recent medical visit)?     Same  3. PAIN: How bad is the pain?  (e.g., Scale 1-10; mild, moderate, or severe)     Described as burning  4. FEVER: Do you have a fever? If Yes, ask: What is it, how was it measured, and when did it start?     Denies  5. OTHER SYMPTOMS: Do you have any other symptoms? (e.g., blood in the urine, flank pain, vaginal discharge)     Nausea, right sided flank pain  Protocols used: Urinary Tract Infection on Antibiotic Follow-up Call - [facility]   Copied from CRM [id number]. Topic: Clinical - Red Word Triage >> Apr 23, 2024  4:26 PM Dedra B wrote: Red Word that prompted transfer to Nurse Triage: Pt is still having pain in bladder and  R ankle. Warm transfer to NT.

## 2024-04-23 NOTE — Telephone Encounter (Signed)
 Explained that our office treats chronic pain, and we have a letter that we may sent to her surgeon explaining that.  Dr. Ozell Covey  [facility]

## 2024-04-23 NOTE — Telephone Encounter (Signed)
 Faxed to [employer] at [phone] again

## 2024-04-23 NOTE — Telephone Encounter (Signed)
 Patient states she is unable to get post op pain medication from surgeon. Please advise patient. She had scheduled an appt this morning VV with Dr Tanya. I canceled that appt.

## 2024-04-25 ENCOUNTER — Ambulatory Visit: Payer: MEDICAID

## 2024-04-26 ENCOUNTER — Ambulatory Visit: Payer: Self-pay

## 2024-04-26 ENCOUNTER — Other Ambulatory Visit: Payer: Self-pay | Admitting: Nurse Practitioner

## 2024-04-26 DIAGNOSIS — F250 Schizoaffective disorder, bipolar type: Secondary | ICD-10-CM

## 2024-04-26 DIAGNOSIS — G4709 Other insomnia: Secondary | ICD-10-CM

## 2024-04-26 NOTE — Telephone Encounter (Signed)
 FYI Only or Action Required?: FYI only for provider: appointment scheduled on 11/11 at alternative office.  Patient was last seen in primary care on 04/20/2024 by Douglass Kenney NOVAK, FNP.  Called Nurse Triage reporting Dysuria.  Symptoms began 2 weeks ago.  Interventions attempted: Other: was seen at urgent care and has been on one antibiotic.  Symptoms are: gradually worsening.  Triage Disposition: See Physician Within 24 Hours  Patient/caregiver understands and will follow disposition?: Yes      Copied from CRM [id number]. Topic: Clinical - Red Word Triage >> Apr 26, 2024  4:30 PM Viola F wrote: Patient having pain, kidney/bladder problems - requesting appt. She also called in on Friday 04/23/24 >> Apr 26, 2024  4:34 PM Viola FALCON wrote: Patient was seen at Urgent Care 04/24/24       Reason for Disposition  All other patients with painful urination  (Exception: [1] EITHER frequency or urgency AND [2] has on-call doctor.)  Answer Assessment - Initial Assessment Questions 1. SEVERITY: How bad is the pain?  (e.g., Scale 1-10; mild, moderate, or severe)     Moderate to severe  2. FREQUENCY: How many times have you had painful urination today?      4-5 times today  3. PATTERN: Is pain present every time you urinate or just sometimes?      Every urination  4. ONSET: When did the painful urination start?       A couple of weeks 5. FEVER: Do you have a fever? If Yes, ask: What is your temperature, how was it measured, and when did it start?     Slight fever over the weekend  6. PAST UTI: Have you had a urine infection before? If Yes, ask: When was the last time? and What happened that time?      Yes 7. CAUSE: What do you think is causing the painful urination?  (e.g., UTI, scratch, Herpes sore)     Unsure, had a UTI but now worse  8. OTHER SYMPTOMS: Do you have any other symptoms? (e.g., blood in urine, flank pain, genital sores, urgency, vaginal discharge)      Incontinence  Protocols used: Urination Pain - Female-A-AH

## 2024-04-27 ENCOUNTER — Ambulatory Visit (INDEPENDENT_AMBULATORY_CARE_PROVIDER_SITE_OTHER): Payer: MEDICAID | Admitting: Family Medicine

## 2024-04-27 ENCOUNTER — Other Ambulatory Visit (HOSPITAL_COMMUNITY)
Admission: RE | Admit: 2024-04-27 | Discharge: 2024-04-27 | Disposition: A | Discharge status: Home / Self Care | Payer: MEDICAID | Source: Ambulatory Visit | Attending: Family Medicine | Admitting: Family Medicine

## 2024-04-27 VITALS — BP 149/99 | HR 83 | Temp 97.7°F

## 2024-04-27 DIAGNOSIS — R32 Unspecified urinary incontinence: Secondary | ICD-10-CM

## 2024-04-27 DIAGNOSIS — R300 Dysuria: Secondary | ICD-10-CM

## 2024-04-27 DIAGNOSIS — M546 Pain in thoracic spine: Secondary | ICD-10-CM

## 2024-04-27 NOTE — Progress Notes (Unsigned)
   Acute Office Visit  Introduced to nurse practitioner role and practice setting.  All questions answered.  Discussed provider/patient relationship and expectations.   Subjective:     Patient ID: Andrea [name], female    DOB: [birth year], 42 y.o.   MRN: [id number]  Chief Complaint  Patient presents with  . Back Pain    Patient has recently been treated for UTI with 2 rounds of antibiotics.  She just finished that last one 5 days ago.  She reports her symptoms have improved with the exception of back pain and incontinence with standing.      Discussed the use of AI scribe software for clinical note transcription with the patient, who gave verbal consent to proceed.  History of Present Illness      HPI  ROS      Objective:    BP (!) 150/100 (BP Location: Left Arm, Patient Position: Sitting, Cuff Size: Large)   Pulse 83   Temp 97.7 F (36.5 C) (Oral)   LMP  (LMP Unknown)   SpO2 96%  {Vitals History (Optional):23777}  Physical Exam  No results found for any visits on 04/27/24.      Assessment & Plan:  Assessment and Plan Assessment & Plan      Problem List Items Addressed This Visit   None   No orders of the defined types were placed in this encounter.   No follow-ups on file.  Curtis DELENA Boom, FNP  I, Curtis DELENA Boom, FNP, have reviewed all documentation for this visit. The documentation on 04/27/24 for the exam, diagnosis, procedures, and orders are all accurate and complete.

## 2024-04-28 ENCOUNTER — Other Ambulatory Visit: Payer: Self-pay | Admitting: Family Medicine

## 2024-04-28 ENCOUNTER — Encounter: Payer: Self-pay | Admitting: Family Medicine

## 2024-04-28 ENCOUNTER — Ambulatory Visit: Payer: Self-pay | Admitting: Family Medicine

## 2024-04-28 DIAGNOSIS — B379 Candidiasis, unspecified: Secondary | ICD-10-CM

## 2024-04-28 LAB — COMPREHENSIVE METABOLIC PANEL WITH GFR
ALT: 12 IU/L (ref 0–32)
AST: 13 IU/L (ref 0–40)
Albumin: 4.5 g/dL (ref 3.9–4.9)
Alkaline Phosphatase: 170 IU/L — ABNORMAL HIGH (ref 41–116)
BUN/Creatinine Ratio: 19 (ref 9–23)
BUN: 19 mg/dL (ref 6–24)
Bilirubin Total: 0.3 mg/dL (ref 0.0–1.2)
CO2: 23 mmol/L (ref 20–29)
Calcium: 9.7 mg/dL (ref 8.7–10.2)
Chloride: 102 mmol/L (ref 96–106)
Creatinine, Ser: 1 mg/dL (ref 0.57–1.00)
Globulin, Total: 2.3 g/dL (ref 1.5–4.5)
Glucose: 138 mg/dL — ABNORMAL HIGH (ref 70–99)
Potassium: 4.3 mmol/L (ref 3.5–5.2)
Sodium: 140 mmol/L (ref 134–144)
Total Protein: 6.8 g/dL (ref 6.0–8.5)
eGFR: 72 mL/min/1.73 (ref >59)

## 2024-04-28 LAB — CBC WITH DIFFERENTIAL/PLATELET
Basophils Absolute: 0.1 x10E3/uL (ref 0.0–0.2)
Basos: 1 %
EOS (ABSOLUTE): 0.2 x10E3/uL (ref 0.0–0.4)
Eos: 4 %
Hematocrit: 40 % (ref 34.0–46.6)
Hemoglobin: 12.9 g/dL (ref 11.1–15.9)
Immature Grans (Abs): 0 x10E3/uL (ref 0.0–0.1)
Immature Granulocytes: 0 %
Lymphocytes Absolute: 2.5 x10E3/uL (ref 0.7–3.1)
Lymphs: 40 %
MCH: 29.5 pg (ref 26.6–33.0)
MCHC: 32.3 g/dL (ref 31.5–35.7)
MCV: 92 fL (ref 79–97)
Monocytes Absolute: 0.3 x10E3/uL (ref 0.1–0.9)
Monocytes: 4 %
Neutrophils Absolute: 3.2 x10E3/uL (ref 1.4–7.0)
Neutrophils: 50 %
Platelets: 241 x10E3/uL (ref 150–450)
RBC: 4.37 x10E6/uL (ref 3.77–5.28)
RDW: 12.7 % (ref 11.7–15.4)
WBC: 6.3 x10E3/uL (ref 3.4–10.8)

## 2024-04-28 LAB — CERVICOVAGINAL ANCILLARY ONLY
Bacterial Vaginitis (gardnerella): NEGATIVE
Candida Glabrata: POSITIVE — AB
Candida Vaginitis: POSITIVE — AB
Chlamydia: NEGATIVE
Comment: NEGATIVE
Comment: NEGATIVE
Comment: NEGATIVE
Comment: NEGATIVE
Comment: NEGATIVE
Comment: NORMAL
Neisseria Gonorrhea: NEGATIVE
Trichomonas: NEGATIVE

## 2024-04-28 MED ORDER — BORIC ACID 600 MG VA SUPP: 600.0000 mg | Freq: Every evening | VAGINAL | 0 refills | Status: DC

## 2024-04-28 NOTE — Progress Notes (Unsigned)
 b

## 2024-04-29 ENCOUNTER — Ambulatory Visit: Payer: Self-pay | Admitting: Nurse Practitioner

## 2024-04-29 ENCOUNTER — Encounter: Payer: Self-pay | Admitting: Family Medicine

## 2024-04-29 ENCOUNTER — Other Ambulatory Visit: Payer: Self-pay | Admitting: Family Medicine

## 2024-04-29 DIAGNOSIS — R32 Unspecified urinary incontinence: Secondary | ICD-10-CM

## 2024-04-29 DIAGNOSIS — N390 Urinary tract infection, site not specified: Secondary | ICD-10-CM

## 2024-04-29 DIAGNOSIS — M8580 Other specified disorders of bone density and structure, unspecified site: Secondary | ICD-10-CM | POA: Insufficient documentation

## 2024-05-03 ENCOUNTER — Emergency Department: Payer: MEDICAID

## 2024-05-03 ENCOUNTER — Emergency Department
Admission: EM | Admit: 2024-05-03 | Discharge: 2024-05-06 | Disposition: A | Discharge status: Other Acute Inpatient Hospital | Payer: MEDICAID | Attending: Emergency Medicine | Admitting: Emergency Medicine

## 2024-05-03 ENCOUNTER — Ambulatory Visit: Payer: MEDICAID | Admitting: Internal Medicine

## 2024-05-03 ENCOUNTER — Other Ambulatory Visit: Payer: Self-pay

## 2024-05-03 ENCOUNTER — Encounter: Payer: Self-pay | Admitting: Internal Medicine

## 2024-05-03 ENCOUNTER — Ambulatory Visit: Payer: Self-pay

## 2024-05-03 VITALS — BP 136/86 | HR 84 | Ht 71.0 in | Wt 270.0 lb

## 2024-05-03 DIAGNOSIS — Z7951 Long term (current) use of inhaled steroids: Secondary | ICD-10-CM | POA: Insufficient documentation

## 2024-05-03 DIAGNOSIS — M545 Low back pain, unspecified: Secondary | ICD-10-CM | POA: Insufficient documentation

## 2024-05-03 DIAGNOSIS — R319 Hematuria, unspecified: Secondary | ICD-10-CM | POA: Insufficient documentation

## 2024-05-03 DIAGNOSIS — F600 Paranoid personality disorder: Secondary | ICD-10-CM | POA: Diagnosis not present

## 2024-05-03 DIAGNOSIS — D72829 Elevated white blood cell count, unspecified: Secondary | ICD-10-CM | POA: Insufficient documentation

## 2024-05-03 DIAGNOSIS — R1030 Lower abdominal pain, unspecified: Secondary | ICD-10-CM | POA: Diagnosis not present

## 2024-05-03 DIAGNOSIS — Z79899 Other long term (current) drug therapy: Secondary | ICD-10-CM | POA: Insufficient documentation

## 2024-05-03 DIAGNOSIS — F332 Major depressive disorder, recurrent severe without psychotic features: Secondary | ICD-10-CM | POA: Diagnosis not present

## 2024-05-03 DIAGNOSIS — F333 Major depressive disorder, recurrent, severe with psychotic symptoms: Secondary | ICD-10-CM | POA: Insufficient documentation

## 2024-05-03 DIAGNOSIS — J45909 Unspecified asthma, uncomplicated: Secondary | ICD-10-CM | POA: Insufficient documentation

## 2024-05-03 DIAGNOSIS — R45851 Suicidal ideations: Secondary | ICD-10-CM | POA: Insufficient documentation

## 2024-05-03 DIAGNOSIS — E119 Type 2 diabetes mellitus without complications: Secondary | ICD-10-CM | POA: Insufficient documentation

## 2024-05-03 DIAGNOSIS — R309 Painful micturition, unspecified: Secondary | ICD-10-CM | POA: Diagnosis not present

## 2024-05-03 DIAGNOSIS — F29 Unspecified psychosis not due to a substance or known physiological condition: Secondary | ICD-10-CM | POA: Diagnosis not present

## 2024-05-03 DIAGNOSIS — I10 Essential (primary) hypertension: Secondary | ICD-10-CM | POA: Diagnosis not present

## 2024-05-03 LAB — COMPREHENSIVE METABOLIC PANEL WITH GFR
ALT: 17 U/L (ref 0–44)
AST: 19 U/L (ref 15–41)
Albumin: 4.6 g/dL (ref 3.5–5.0)
Alkaline Phosphatase: 162 U/L — ABNORMAL HIGH (ref 38–126)
Anion gap: 13 (ref 5–15)
BUN: 18 mg/dL (ref 6–20)
CO2: 24 mmol/L (ref 22–32)
Calcium: 9.6 mg/dL (ref 8.9–10.3)
Chloride: 103 mmol/L (ref 98–111)
Creatinine, Ser: 0.76 mg/dL (ref 0.44–1.00)
GFR, Estimated: >60 mL/min (ref >60)
Glucose, Bld: 98 mg/dL (ref 70–99)
Potassium: 3.9 mmol/L (ref 3.5–5.1)
Sodium: 140 mmol/L (ref 135–145)
Total Bilirubin: 0.3 mg/dL (ref 0.0–1.2)
Total Protein: 6.9 g/dL (ref 6.5–8.1)

## 2024-05-03 LAB — CBC
HCT: 41.7 % (ref 36.0–46.0)
Hemoglobin: 13 g/dL (ref 12.0–15.0)
MCH: 29.3 pg (ref 26.0–34.0)
MCHC: 31.2 g/dL (ref 30.0–36.0)
MCV: 94.1 fL (ref 80.0–100.0)
Platelets: 254 K/uL (ref 150–400)
RBC: 4.43 MIL/uL (ref 3.87–5.11)
RDW: 13.4 % (ref 11.5–15.5)
WBC: 6.6 K/uL (ref 4.0–10.5)
nRBC: 0 % (ref 0.0–0.2)

## 2024-05-03 LAB — POCT URINALYSIS DIPSTICK
Bilirubin, UA: NEGATIVE
Glucose, UA: POSITIVE — AB
Leukocytes, UA: NEGATIVE
Nitrite, UA: NEGATIVE
Protein, UA: POSITIVE — AB
Spec Grav, UA: >=1.03 — AB (ref 1.010–1.025)
Urobilinogen, UA: 0.2 U/dL
pH, UA: 5.5 (ref 5.0–8.0)

## 2024-05-03 LAB — URINE DRUG SCREEN
Amphetamines: NEGATIVE
Barbiturates: NEGATIVE
Benzodiazepines: POSITIVE — AB
Cocaine: NEGATIVE
Fentanyl: NEGATIVE
Methadone Scn, Ur: NEGATIVE
Opiates: NEGATIVE
Tetrahydrocannabinol: NEGATIVE

## 2024-05-03 LAB — URINALYSIS, ROUTINE W REFLEX MICROSCOPIC
Bilirubin Urine: NEGATIVE
Glucose, UA: >=500 mg/dL — AB
Hgb urine dipstick: NEGATIVE
Ketones, ur: NEGATIVE mg/dL
Nitrite: NEGATIVE
Protein, ur: NEGATIVE mg/dL
Specific Gravity, Urine: 1.014 (ref 1.005–1.030)
pH: 5 (ref 5.0–8.0)

## 2024-05-03 LAB — ETHANOL: Alcohol, Ethyl (B): <15 mg/dL (ref <15)

## 2024-05-03 MED ORDER — OLANZAPINE 5 MG PO TBDP
5.0000 mg | ORAL_TABLET | Freq: Once | ORAL | Status: AC
  Administered 2024-05-03: 5 mg via ORAL
  Filled 2024-05-03: qty 1

## 2024-05-03 MED ORDER — LORAZEPAM 0.5 MG PO TABS
0.2500 mg | ORAL_TABLET | Freq: Once | ORAL | Status: AC
  Administered 2024-05-03: 0.25 mg via ORAL
  Filled 2024-05-03: qty 1

## 2024-05-03 MED ORDER — LORATADINE 10 MG PO TABS
10.0000 mg | ORAL_TABLET | Freq: Every day | ORAL | Status: DC
  Administered 2024-05-04 – 2024-05-06 (×3): 10 mg via ORAL
  Filled 2024-05-03 (×3): qty 1

## 2024-05-03 MED ORDER — OXYCODONE-ACETAMINOPHEN 5-325 MG PO TABS
1.0000 | ORAL_TABLET | Freq: Once | ORAL | Status: AC
  Administered 2024-05-03: 1 via ORAL
  Filled 2024-05-03: qty 1

## 2024-05-03 MED ORDER — BUSPIRONE HCL 5 MG PO TABS
10.0000 mg | ORAL_TABLET | Freq: Two times a day (BID) | ORAL | Status: DC
  Administered 2024-05-03 – 2024-05-06 (×6): 10 mg via ORAL
  Filled 2024-05-03 (×6): qty 2

## 2024-05-03 MED ORDER — TRAZODONE HCL 50 MG PO TABS
150.0000 mg | ORAL_TABLET | Freq: Every day | ORAL | Status: DC
  Administered 2024-05-03 – 2024-05-05 (×3): 150 mg via ORAL
  Filled 2024-05-03 (×3): qty 1

## 2024-05-03 NOTE — BH Assessment (Signed)
 Comprehensive Clinical Assessment (CCA) Screening, Triage and Referral Note  05/03/2024 Andrea [name] [id number] Recommendations for Services/Supports/Treatments: Consulted with NP Jon HERO., who recommended pt. for inpatient treatment.   Andrea [name] is a 43 year old Caucasian, English speaking female. Pt presented to [facility] ED voluntarily. Per triage note: Pt sts that she has been having lower back pain when she urinates. Pt sts that she saw her pcp and they are thinking kidney stone. Pt endorses that she has been depressed and having anxiety.  to overdose on her medication explaining that she often looks at them, debating doing so. Pt denies substance use. Presenting Problem: Patient presented to the hospital reporting worsening depression, anxiety, and PTSD symptoms. She expressed feelings of hopelessness and frustration related to health struggles, job loss, and social isolation due to being bedbound. Mental Status Examination (MSE): Appearance: Bedbound, tearful affect Mood: Depressed Affect: Tearful, congruent with mood Speech: Expansive when discussing reasons for presentation Thought Process: Relevant and organized Thought Content:  Endorses intermittent, passive suicidal ideation (SI) Reports plan to overdose on medication; frequently contemplates doing so Denies homicidal ideation (HI) Reports paranoia, visual hallucinations (seeing threatening figures), and auditory hallucinations related to past trauma Perception: Hallucinations (visual and auditory), paranoia Insight/Judgment: Limited due to severity of symptoms Cognition: Alert and oriented Risk Assessment: Suicidal Ideation: Present, with plan (overdose on medication) Homicidal Ideation: Denied Protective Factors: None identified; reports feeling unsupported and alone Risk Level: High due to active SI with plan and psychotic features Psychosocial Factors: Reports isolation and lack of support (husband working  overtime) Stressors include health decline, job loss, worsening mental health symptoms Experiences night terrors, sleep disturbance, decreased appetite, weight loss, and irritability Chief Complaint:  Chief Complaint  Patient presents with   Back Pain   Hallucinations   Visit Diagnosis: Bipolar I disorder, most recent episode depressed (HCC)   Patient Reported Information How did you hear about us ? Other (Comment)  What Is the Reason for Your Visit/Call Today? depression, anxiety, PTSD  How Long Has This Been Causing You Problems? 1-6 months  What Do You Feel Would Help You the Most Today? Treatment for Depression or other mood problem   Have You Recently Had Any Thoughts About Hurting Yourself? Yes (Not wanting to be here [place] anymore. Denies plan/intent)  Are You Planning to Commit Suicide/Harm Yourself At This time? No   Have you Recently Had Thoughts About Hurting Someone Sherral? No  Are You Planning to Harm Someone at This Time? No  Explanation: No data recorded  Have You Used Any Alcohol  or Drugs in the Past 24 Hours? No  How Long Ago Did You Use Drugs or Alcohol ? No data recorded What Did You Use and How Much? No data recorded  Do You Currently Have a Therapist/Psychiatrist? Yes  Name of Therapist/Psychiatrist: Dr. Barbra for psychiatry for 4-5 years; no individual therapy at this time   Have You Been Recently Discharged From Any Office Practice or Programs? No  Explanation of Discharge From Practice/Program: No data recorded   CCA Screening Triage Referral Assessment Type of Contact: Tele-Assessment  Telemedicine Service Delivery:   Is this Initial or Reassessment?   Date Telepsych consult ordered in [facility]:    Time Telepsych consult ordered in [facility]:    Location of Assessment: Other (comment)  Provider Location: Other (comment)    Collateral Involvement: chart reivew   Does Patient Have a Court Appointed Legal Guardian? No data recorded Name and  Contact of Legal Guardian: No data recorded If Minor  and Not Living with Parent(s), Who has Custody? No data recorded Is CPS involved or ever been involved? Never  Is APS involved or ever been involved? Never   Patient Determined To Be At Risk for Harm To Self or Others Based on Review of Patient Reported Information or Presenting Complaint? No data recorded Method: No data recorded Availability of Means: No data recorded Intent: No data recorded Notification Required: No data recorded Additional Information for Danger to Others Potential: No data recorded Additional Comments for Danger to Others Potential: No data recorded Are There Guns or Other Weapons in Your Home? No  Types of Guns/Weapons: No data recorded Are These Weapons Safely Secured?                            No data recorded Who Could Verify You Are Able To Have These Secured: No data recorded Do You Have any Outstanding Charges, Pending Court Dates, Parole/Probation? No data recorded Contacted To Inform of Risk of Harm To Self or Others: No data recorded  Does Patient Present under Involuntary Commitment? No    [place] of Residence: [place]   Patient Currently Receiving the Following Services: Medication Management   Determination of Need: Urgent (48 hours)   Options For Referral: Partial Hospitalization   Disposition Recommendation per psychiatric provider: Inpatient treatment  Guy Toney R Ronav Furney, LCAS

## 2024-05-03 NOTE — ED Triage Notes (Signed)
 Pt sts that she has been having lower back pain when she urinates. Pt sts that she saw her pcp and they are thinking kidney stone. Pt endorses that she has been depressed and having anxiety.

## 2024-05-03 NOTE — Telephone Encounter (Signed)
 FYI Only or Action Required?: FYI only for provider: appointment scheduled on 05/03/2024.  Patient was last seen in primary care on 04/27/2024 by Wellington Curtis LABOR, FNP.  Called Nurse Triage reporting Dysuria.  Symptoms began yesterday.  Interventions attempted: Rest, hydration, or home remedies.  Symptoms are: unchanged.  Triage Disposition: Call PCP Within 24 Hours  Patient/caregiver understands and will follow disposition?: Yes  Copied from CRM #[id number]. Topic: Clinical - Red Word Triage >> May 03, 2024 12:21 PM Andrea [name] wrote: Red Word that prompted transfer to Nurse Triage: Patient with pain and blood in urine   ----------------------------------------------------------------------- From previous Reason for Contact - Scheduling: Patient/patient representative is calling to schedule an appointment. Refer to attachments for appointment information. Reason for Disposition  [1] Painful urination AND [2] EITHER frequency or urgency AND [3] has on-call doctor  Answer Assessment - Initial Assessment Questions 1. SEVERITY: How bad is the pain?  (e.g., Scale 1-10; mild, moderate, or severe)     8/10 2. FREQUENCY: How many times have you had painful urination today?      With each urination 3. PATTERN: Is pain present every time you urinate or just sometimes?      Constant pain, worse with urination 4. ONSET: When did the painful urination start?      One day ago 5. FEVER: Do you have a fever? If Yes, ask: What is your temperature, how was it measured, and when did it start?     Not measured, but chills 6. PAST UTI: Have you had a urine infection before? If Yes, ask: When was the last time? and What happened that time?      Has had UTI on and off since 04/15/2024 7. CAUSE: What do you think is causing the painful urination?  (e.g., UTI, scratch, Herpes sore)     UTI 8. OTHER SYMPTOMS: Do you have any other symptoms? (e.g., blood in urine, flank pain, genital  sores, urgency, vaginal discharge)     Abdominal and flank pain, blood in urine  Protocols used: Urination Pain - Female-A-AH

## 2024-05-03 NOTE — ED Provider Notes (Signed)
 [facility] Provider Note   Event Date/Time   First MD Initiated Contact with Patient 05/03/24 1925     (approximate) History  Back Pain and Hallucinations  HPI Andrea [name] is a 43 y.o. female with a past medical history of migraines, PCOS, schizophrenia, bipolar disorder, neuromuscular disorder, kidney stones, and morbid obesity who presents complaining of left-sided and central low back pain with associated urinary stress incontinence that has been present over the last few weeks.  Patient states that she has painful urination as well as has urinated on herself due to not being able to get to the bathroom in time.  Patient states that she has been treated with 3 different course of antibiotics for her urinary tract infection.  Patient was told to present to the emergency department for the possibility of a kidney stone.  Patient denies any radiation of this pain.  Patient denies any trauma.  Patient does endorse suprapubic abdominal pain that is worse with urination.  Patient also endorses increasing depression with suicidal ideation without and requests to speak to psychiatry. ROS: Patient currently denies any vision changes, tinnitus, difficulty speaking, facial droop, sore throat, chest pain, shortness of breath, nausea/vomiting/diarrhea, dysuria, or weakness/numbness/paresthesias in any extremity   Physical Exam  Triage Vital Signs: ED Triage Vitals  Encounter Vitals Group     BP 05/03/24 1713 (!) 153/111     Girls Systolic BP Percentile --      Girls Diastolic BP Percentile --      Boys Systolic BP Percentile --      Boys Diastolic BP Percentile --      Pulse Rate 05/03/24 1713 92     Resp 05/03/24 1713 18     Temp 05/03/24 1713 98 F (36.7 C)     Temp Source 05/03/24 1713 Oral     SpO2 05/03/24 1713 95 %     Weight 05/03/24 1714 270 lb (122.5 kg)     Height 05/03/24 1714 5' 11 (1.803 m)     Head Circumference --      Peak Flow --      Pain  Score 05/03/24 1714 9     Pain Loc --      Pain Education --      Exclude from Growth Chart --    Most recent vital signs: Vitals:   05/03/24 1713  BP: (!) 153/111  Pulse: 92  Resp: 18  Temp: 98 F (36.7 C)  SpO2: 95%   General: Awake, oriented x4. CV:  Good peripheral perfusion. Resp:  Normal effort. Abd:  No distention. Other:  Morbidly obese middle-age Caucasian female resting comfortably in no acute distress.  Left paraspinal lower lumbar musculature tenderness to palpation with associated spasm.  There is also mild tenderness over the midline at the lower lumbar spine ED Results / Procedures / Treatments  Labs (all labs ordered are listed, but only abnormal results are displayed) Labs Reviewed  URINALYSIS, ROUTINE W REFLEX MICROSCOPIC - Abnormal; Notable for the following components:      Result Value   Color, Urine STRAW (*)    APPearance CLEAR (*)    Glucose, UA >=500 (*)    Leukocytes,Ua SMALL (*)    Bacteria, UA RARE (*)    All other components within normal limits  COMPREHENSIVE METABOLIC PANEL WITH GFR - Abnormal; Notable for the following components:   Alkaline Phosphatase 162 (*)    All other components within normal limits  URINE DRUG SCREEN -  Abnormal; Notable for the following components:   Benzodiazepines POSITIVE (*)    All other components within normal limits  ETHANOL  CBC   RADIOLOGY ED MD interpretation: MRI of the lumbar spine without contrast shows no significant canal stenosis with L2/L3 mild right foraminal and subarticular recess stenosis - All radiology independently interpreted and agree with radiology assessment Official radiology report(s): MR LUMBAR SPINE WO CONTRAST Result Date: 05/03/2024 EXAM: MRI LUMBAR SPINE 05/03/2024 10:39:38 PM TECHNIQUE: Multiplanar multisequence MRI of the lumbar spine was performed without the administration of intravenous contrast. COMPARISON: None available. CLINICAL HISTORY: Low back pain, cauda equina  syndrome suspected; urinary incontinence FINDINGS: BONES AND ALIGNMENT: Normal alignment. Normal vertebral body heights. Bone marrow signal is unremarkable. SPINAL CORD: The conus terminates normally. SOFT TISSUES: No paraspinal mass. L1-L2: No significant disc herniation. No spinal canal stenosis or neural foraminal narrowing. L2-L3: Disc height loss and desiccation. Mild disc bulge, eccentric to the right. No spinal canal stenosis. Mild right foraminal and subarticular recess stenosis. L3-L4: No significant disc herniation. No spinal canal stenosis or neural foraminal narrowing. Mild facet arthropathy. L4-L5: No significant disc herniation. No spinal canal stenosis or neural foraminal narrowing. L5-S1: No significant disc herniation. No spinal canal stenosis or neural foraminal narrowing. IMPRESSION: 1. No significant canal stenosis. 2. At L2-L3, mild right foraminal and subarticular recess stenosis. Electronically signed by: Gilmore Molt MD 05/03/2024 11:11 PM EST RP Workstation: [id number]   PROCEDURES: Critical Care performed: No Procedures MEDICATIONS ORDERED IN ED: Medications  busPIRone  (BUSPAR ) tablet 10 mg (has no administration in time range)  loratadine  (CLARITIN ) tablet 10 mg (has no administration in time range)  OLANZapine  zydis (ZYPREXA ) disintegrating tablet 5 mg (has no administration in time range)  traZODone  (DESYREL ) tablet 150 mg (has no administration in time range)  oxyCODONE -acetaminophen  (PERCOCET/ROXICET) 5-325 MG per tablet 1 tablet (1 tablet Oral Given 05/03/24 2018)  LORazepam  (ATIVAN ) tablet 0.25 mg (0.25 mg Oral Given 05/03/24 2155)   IMPRESSION / MDM / ASSESSMENT AND PLAN / ED COURSE  I reviewed the triage vital signs and the nursing notes.                             The patient is on the cardiac monitor to evaluate for evidence of arrhythmia and/or significant heart rate changes. Patient's presentation is most consistent with acute presentation with potential  threat to life or bodily function. Patient is a 43 year old female with the above-stated past medical history that presents for multiple complaints including back pain, dysuria, and suicidal ideation with a plan DDx: Suicidal ideation, urinary tract infection, kidney stone, pyelonephritis Plan: CBC, CMP, UA, ethanol, UDS, MRI lumbar spine Radiologic valuation not significant for cauda equina syndrome or other emergent surgical disease.  Patient is medically cleared for psychiatric evaluation and referral as indicated.  Per telepsychiatry note, they are recommending inpatient admission.   FINAL CLINICAL IMPRESSION(S) / ED DIAGNOSES   Final diagnoses:  Acute left-sided low back pain without sciatica  Suicidal ideation   Rx / DC Orders   ED Discharge Orders     None      Note:  This document was prepared using Dragon voice recognition software and may include unintentional dictation errors.   Lecil Tapp K, MD 05/03/24 [phone]

## 2024-05-03 NOTE — ED Notes (Signed)
 Tele Psych Cart 1 at bedside

## 2024-05-03 NOTE — Progress Notes (Signed)
 Subjective:  Patient ID: Andrea [name], female    DOB: April 10, 1981  Age: 43 y.o. MRN: [id number]  CC: The primary encounter diagnosis was Hematuria, unspecified type. A diagnosis of Major depressive disorder, recurrent severe without psychotic features (HCC) was also pertinent to this visit.   HPI FALYNN AILEY presents for  Chief Complaint  Patient presents with   Hematuria    Blood in urine, back and abdominal pain   43 YR OLD female with history of MDD  and schizophrenia,/bipolar disorder   type 2 DM WITH neuropathy,  recurrent UTIs ,  nephrolithiasis and history of pyelonephritis presents with  24 hour history of bood in urine,  severe left sided low back pain,  nausea with eating,  chills and sweats., and suprapubic pain that started yesterday .    She has  been bedridden except for meals and bathroom breaks because of the foot,  Sept 12 (external fixator placed ) no PT yet.    Last menstrual period:  s/p TAH/BSO in 2017  Bipolar with positive depression screen:  she has recently established care with a new psychiatrist at [facility] [place] because her previous provider left or retired.  She is now  seeing Gwendolyn at [facility].  At last visit  she advised her provider that her depression was escalated, and her buspirone  was increased   she is not listening to me patient is tearful and states that I'm crashing  bad right now  . She has a history of suicidality with 2 prior hospitalizations,  last one in 2018 . Feels she needs to be hospitalized for stabilization .  She is accompanied btoday by selinda,  her fiancee,  who is concerned about her risk and is willing to take her to the [facility] .   Outpatient Medications Prior to Visit  Medication Sig Dispense Refill   Albuterol -Budesonide  (AIRSUPRA) 90-80 MCG/ACT AERO      Atogepant  (QULIPTA ) 30 MG TABS Take 1 tablet daily 30 tablet 11   Boric Acid 600 MG SUPP Place 600 mg vaginally at bedtime. 14 suppository 0   busPIRone  (BUSPAR ) 10 MG tablet  Take 10 mg by mouth 2 (two) times daily.     cetirizine  (ZYRTEC ) 10 MG tablet Take 10 mg by mouth at bedtime.     clonazePAM  (KLONOPIN ) 0.5 MG tablet Take 1 tablet (0.5 mg total) by mouth 3 (three) times daily as needed for anxiety. 90 tablet 0   DULoxetine  (CYMBALTA ) 60 MG capsule Take 2 capsules (120 mg total) by mouth daily. 180 capsule 0   eletriptan  (RELPAX ) 40 MG tablet Take 1 tablet at onset of migraine. May repeat in 2 hours if headache persists or recurs. Do not take more than 3 a week 10 tablet 5   empagliflozin  (JARDIANCE ) 25 MG TABS tablet Take 25 mg by mouth.     famotidine  (PEPCID ) 40 MG tablet Take 1 tablet (40 mg total) by mouth 2 (two) times daily. 180 tablet 0   GEMTESA 75 MG TABS Take 1 tablet by mouth daily.     JARDIANCE  25 MG TABS tablet Take 1 tablet (25 mg total) by mouth every morning. 90 tablet 3   montelukast  (SINGULAIR ) 10 MG tablet TAKE 1 TABLET BY MOUTH EVERYDAY AT BEDTIME 90 tablet 0   naloxone  (NARCAN ) nasal spray 4 mg/0.1 mL Place 1 spray into the nose as needed for up to 365 doses (for opioid-induced respiratory depresssion). In case of emergency (overdose), spray once into each nostril. If no  response within 3 minutes, repeat application and call 911. 1 each 1   ondansetron  (ZOFRAN -ODT) 4 MG disintegrating tablet Take 2 tablets (8 mg total) by mouth every 8 (eight) hours as needed for nausea or vomiting. 30 tablet 0   pantoprazole  (PROTONIX ) 40 MG tablet TAKE 1 TABLET BY MOUTH EVERY DAY IN THE MORNING 90 tablet 1   potassium chloride  (MICRO-K ) 10 MEQ CR capsule TAKE 1 CAPSULE BY MOUTH EVERY DAY 90 capsule 3   QUEtiapine  (SEROQUEL ) 100 MG tablet Take 100 mg by mouth 2 (two) times daily. Take 100 mg by mouth in AM and 200 mg by mouth in PM     Semaglutide ,0.25 or 0.5MG /DOS, (OZEMPIC , 0.25 OR 0.5 MG/DOSE,) 2 MG/3ML SOPN Inject 0.5 mg into the skin once a week. 3 mL 1   SEMAGLUTIDE ,0.25 OR 0.5MG /DOS, [place] Inject into the skin.     SYMBICORT 160-4.5 MCG/ACT inhaler Inhale  2 puffs into the lungs.     tiZANidine  (ZANAFLEX ) 4 MG tablet Take 1 tablet (4 mg total) by mouth every 8 (eight) hours as needed. 270 tablet 3   traZODone  (DESYREL ) 150 MG tablet TAKE 1 TABLET BY MOUTH AT BEDTIME. 90 tablet 3   zonisamide  (ZONEGRAN ) 100 MG capsule Take 5 capsules every night 150 capsule 11   gabapentin  (NEURONTIN ) 600 MG tablet Take 1.5 tablets (900 mg total) by mouth 3 (three) times daily for 14 days. 63 tablet 0   busPIRone  (BUSPAR ) 7.5 MG tablet Take 1 tablet (7.5 mg total) by mouth 2 (two) times daily. 60 tablet 0   No facility-administered medications prior to visit.    Review of Systems;  Patient denies headache, fevers, malaise, unintentional weight loss, skin rash, eye pain, sinus congestion and sinus pain, sore throat, dysphagia,  hemoptysis , cough, dyspnea, wheezing, chest pain, palpitations, orthopnea, edema, abdominal pain, nausea, melena, diarrhea, constipation, flank pain, dysuria, hematuria, urinary  Frequency, nocturia, numbness, tingling, seizures,  Focal weakness, Loss of consciousness,  Tremor, insomnia, depression, anxiety, and suicidal ideation.      Objective:  BP 136/86   Pulse 84   Ht 5' 11 (1.803 m)   Wt 270 lb (122.5 kg)   LMP  (LMP Unknown)   SpO2 99%   BMI 37.66 kg/m   BP Readings from Last 3 Encounters:  05/03/24 136/86  04/27/24 (!) 149/99  04/20/24 130/78    Wt Readings from Last 3 Encounters:  05/03/24 270 lb (122.5 kg)  04/20/24 270 lb (122.5 kg)  04/12/24 270 lb (122.5 kg)    Physical Exam Vitals reviewed.  Constitutional:      General: She is not in acute distress.    Appearance: Normal appearance. She is normal weight. She is not ill-appearing, toxic-appearing or diaphoretic.  HENT:     Head: Normocephalic.  Eyes:     General: No scleral icterus.       Right eye: No discharge.        Left eye: No discharge.     Conjunctiva/sclera: Conjunctivae normal.  Cardiovascular:     Rate and Rhythm: Normal rate and  regular rhythm.     Heart sounds: Normal heart sounds.  Pulmonary:     Effort: Pulmonary effort is normal. No respiratory distress.     Breath sounds: Normal breath sounds.  Musculoskeletal:        General: Normal range of motion.  Feet:     Comments: Right foot in AFO boot from October surgery  Skin:    General: Skin is warm  and dry.  Neurological:     General: No focal deficit present.     Mental Status: She is alert and oriented to person, place, and time. Mental status is at baseline.  Psychiatric:        Attention and Perception: Attention normal.        Mood and Affect: Mood is depressed. Affect is tearful.        Behavior: Behavior normal.        Thought Content: Thought content normal.        Cognition and Memory: Cognition normal.        Judgment: Judgment normal.     Lab Results  Component Value Date   HGBA1C 7.1 (H) 08/05/2023   HGBA1C 6.4 (H) 05/03/2023   HGBA1C 7.5 (H) 07/31/2021    Lab Results  Component Value Date   CREATININE 1.00 04/27/2024   CREATININE 0.98 04/08/2024   CREATININE 0.81 03/31/2024    Lab Results  Component Value Date   WBC 6.3 04/27/2024   HGB 12.9 04/27/2024   HCT 40.0 04/27/2024   PLT 241 04/27/2024   GLUCOSE 138 (H) 04/27/2024   CHOL 238 (H) 06/26/2023   TRIG 209.0 (H) 06/26/2023   HDL 46.70 06/26/2023   LDLCALC 149 (H) 06/26/2023   ALT 12 04/27/2024   AST 13 04/27/2024   NA 140 04/27/2024   K 4.3 04/27/2024   CL 102 04/27/2024   CREATININE 1.00 04/27/2024   BUN 19 04/27/2024   CO2 23 04/27/2024   TSH 0.66 01/16/2024   INR 1.1 11/26/2021   HGBA1C 7.1 (H) 08/05/2023    No results found.  Assessment & Plan:  .Hematuria, unspecified type Assessment & Plan: With severe left lower back pain and history of nephrolithiasis.  CT Scan needed .  Orders: -     POCT urinalysis dipstick -     Urine Culture -     Urine Microscopic  Major depressive disorder, recurrent severe without psychotic features [facility]) Assessment &  Plan:  She has had a recent change in psychiatrists and does not feel that her depressive symptoms have been taken seriously . Patient reports that she has been having suicidal thoughts and is crashing  right now.   She has requested hospitalization.  Her fiancee [name] is in agreement that her condition has been worsening ad has been advised to take her to ER for assessment        Follow-up: No follow-ups on file.   Verneita LITTIE Kettering, MD

## 2024-05-03 NOTE — Assessment & Plan Note (Signed)
 With severe left lower back pain and history of nephrolithiasis.  CT Scan ordered,  treating concurrent drug resistant E Coli UTI with augmentin 

## 2024-05-03 NOTE — ED Notes (Signed)
 Pt wanded by security. Pt's fiance took home her personal wheelchair and previously inventoried belongings.

## 2024-05-03 NOTE — ED Notes (Signed)
 Pts belongings removed and pt dressed in purple hospital scrubs. Belongings with fiance.   Belongings removed: silver right, left black shoe, right cam boot, leopard printed dress, small black hair clip, and pink hair tie.

## 2024-05-03 NOTE — ED Notes (Signed)
 Meds given.

## 2024-05-03 NOTE — ED Notes (Addendum)
 Pt arrives accompanied by fiance via personal wheelchair to room. Pt reports concern for lower back pain that started on Sunday. Reports pain worsening with urination. Has had recurrent UTI. No chills/fevers.   Pt also here for evaluation concerning SI. Has had visual hallucinations. Sts she seeing people that aren't. On arrival to room pt looked under the stretcher to ensure on one was under the bed. Has been increasingly depressed d/t mobility issues. Denies plan or intent to hurt herself. Does had a wedding planned in April.

## 2024-05-03 NOTE — ED Notes (Signed)
 MRI technician informed PRN ativan  given at this time for scheduled imaging

## 2024-05-03 NOTE — Consult Note (Signed)
 [facility] Psychiatric Consult Initial  Patient Name: .Andrea [name]  MRN: [id number]  DOB: 10/17/1980  Consult Order details:  Orders (From admission, onward)     Start     Ordered   05/03/24 2016  CONSULT TO CALL ACT TEAM       Ordering Provider: Jossie Artist POUR, MD  Provider:  (Not yet assigned)  Question:  Reason for Consult?  Answer:  Psych consult   05/03/24 2015   05/03/24 2016  IP CONSULT TO PSYCHIATRY       Ordering Provider: Jossie Artist POUR, MD  Provider:  (Not yet assigned)  Question Answer Comment  Consult Timeframe ROUTINE - requires response within 24 hours   Reason for Consult? Consult for medication management   Contact phone number where the requesting provider can be reached [phone]      05/03/24 2015             Mode of Visit: Tele-visit Virtual Statement:TELE PSYCHIATRY ATTESTATION & CONSENT As the provider for this telehealth consult, I attest that I verified the patient's identity using two separate identifiers, introduced myself to the patient, provided my credentials, disclosed my location, and performed this encounter via a HIPAA-compliant, real-time, face-to-face, two-way, interactive audio and video platform and with the full consent and agreement of the patient (or guardian as applicable.) Patient physical location: [facility]. Telehealth provider physical location: home office in state of [place] .   Video start time:   Video end time:      Psychiatry Consult Evaluation  Service Date: May 03, 2024 LOS:  LOS: 0 days  Chief Complaint Severe Depression, SI  Primary Psychiatric Diagnoses  MDD w/psychotic features 2.  SI  Assessment  Andrea [name] is a 43 y.o. female admitted: Presented to the EDfor 05/03/2024  7:23 PM for severe depression, suicidal ideation with plan to overdose, paranoia, and auditory/visual hallucinations. She carries the psychiatric diagnoses of Major Depressive Disorder (recurrent,  severe, with psychotic features) and PTSD. She has a past medical history significant for multiple right ankle fractures requiring four surgeries (most recent on 02/27/24), chronic pain, and prolonged immobility.  Her current presentation of worsening depression with suicidal ideation and active psychosis is most consistent with Major Depressive Disorder with Psychotic Features, acute exacerbation. She meets criteria for inpatient psychiatric hospitalization based on active SI with a plan, psychosis, functional decline, inability to maintain safety, and lack of outpatient support. Current outpatient psychotropic medications include Cymbalta , Buspar , and Trazodone , and historically she has had a partial response to these medications. She was likely non-optimally compliant with medications prior to admission as evidenced by worsening symptoms and prolonged bedbound state due to medical issues. On initial examination, patient appears depressed, tearful, endorsing SI with plan, AVH, and paranoid ideation; she is cooperative and agreeable to inpatient admission.  Diagnoses:  Active Hospital problems: Active Problems:   * No active hospital problems. *    Plan   ## Psychiatric Medication Recommendations:  Zyprexa  5mg  at bedtime  ## Medical Decision Making Capacity: Not specifically addressed in this encounter  ## Disposition:-- We recommend inpatient psychiatric hospitalization when medically cleared. Patient is under voluntary admission status at this time; please IVC if attempts to leave hospital.  ## Behavioral / Environmental: - No specific recommendations at this time.     ## Safety and Observation Level:  - Based on my clinical evaluation, I estimate the patient to be at low risk of self harm in the current setting. -  At this time, we recommend  routine. This decision is based on my review of the chart including patient's history and current presentation, interview of the patient, mental  status examination, and consideration of suicide risk including evaluating suicidal ideation, plan, intent, suicidal or self-harm behaviors, risk factors, and protective factors. This judgment is based on our ability to directly address suicide risk, implement suicide prevention strategies, and develop a safety plan while the patient is in the clinical setting. Please contact our team if there is a concern that risk level has changed.  CSSR Risk Category:C-SSRS RISK CATEGORY: No Risk  Suicide Risk Assessment: Patient has following modifiable risk factors for suicide: active suicidal ideation, which we are addressing by inpatient admission. Patient has following non-modifiable or demographic risk factors for suicide: history of suicide attempt Patient has the following protective factors against suicide: Supportive family  Thank you for this consult request. Recommendations have been communicated to the primary team.  We will recommend inpatient admission at this time.   Eilan Mcinerny, NP       History of Present Illness  Relevant Aspects of Hospital ED Course:  Admitted on 05/03/2024 for severe depression, suicidal ideation with plan to overdose, paranoia, and auditory/visual hallucinations.   Patient Report:  The patient reports severe depression and active suicidal ideation with a plan to overdose. She states she has been feeling increasingly hopeless and overwhelmed since being bedbound following her 4th right ankle surgery on 02/27/24. She reports breaking her ankle last November and subsequently being forced to resign from her job in February 2025; she had worked as an environmental health practitioner for [employer].  She endorses auditory hallucinations, describing voices that talk about my past traumas and remind me of them over and over. She also endorses visual hallucinations, stating she can see "people watching me. they look mean like they want to hurt me." She reports paranoia,  believing someone may be hiding under her bed or watching her.  She denies HI. She denies any current substance use. She reports taking Cymbalta , Buspar , and Trazodone , and denies having any outpatient therapist or psychiatric provider. She lives with her fianc.  She reports a prior suicide attempt in 2017 via overdose.  She is agreeable to inpatient admission for stabilization.  Psych ROS:  Depression: yes Anxiety:  yes Mania (lifetime and current): no Psychosis: (lifetime and current): yes  Review of Systems  Constitutional: Negative.   HENT: Negative.    Eyes: Negative.   Respiratory: Negative.    Cardiovascular: Negative.   Musculoskeletal:  Positive for joint pain.  Skin: Negative.   Neurological: Negative.   Psychiatric/Behavioral:  Positive for depression, hallucinations and suicidal ideas. The patient is nervous/anxious.      Psychiatric and Social History  Psychiatric History:  Information collected from Patient and chart history  Prev Dx/Sx: Depression and Anxiety Current Psych Provider: denies Home Meds (current): Cymbalta , buspar , trazodone  Previous Med Trials: none reported Therapy: denies  Prior Psych Hospitalization: yes  Prior Self Harm: no Prior Violence: yes  Family Psych History: none reported Family Hx suicide: none reported  Social History:  Developmental Hx: unknown Educational Hx: unknown Occupational Hx: unknown Legal Hx: unknown Living Situation: with fiance Spiritual Hx: unknown Access to weapons/lethal means: no   Substance History Alcohol : denies  Tobacco: denies Illicit drugs: denies Prescription drug abuse: denies Rehab hx: denies  Exam Findings   Vital Signs:  Temp:  [98 F (36.7 C)] 98 F (36.7 C) (11/17 1713) Pulse Rate:  [  84-92] 92 (11/17 1713) Resp:  [18] 18 (11/17 1713) BP: (136-153)/(86-111) 153/111 (11/17 1713) SpO2:  [95 %-99 %] 95 % (11/17 1713) Weight:  [122.5 kg] 122.5 kg (11/17 1714) Blood pressure (!)  153/111, pulse 92, temperature 98 F (36.7 C), temperature source Oral, resp. rate 18, height 5' 11 (1.803 m), weight 122.5 kg, SpO2 95%. Body mass index is 37.66 kg/m.  Physical Exam HENT:     Head: Normocephalic.     Nose: Nose normal.     Mouth/Throat:     Pharynx: Oropharynx is clear.  Eyes:     Extraocular Movements: Extraocular movements intact.  Pulmonary:     Effort: Pulmonary effort is normal.  Musculoskeletal:        General: Signs of injury present.     Cervical back: Normal range of motion.  Skin:    General: Skin is dry.  Neurological:     Mental Status: She is alert.     Other History   These have been pulled in through the EMR, reviewed, and updated if appropriate.  Family History:  The patient's family history includes Breast cancer in her maternal grandmother; Depression in her maternal aunt and maternal aunt; Diabetes in her maternal aunt; Healthy in her brother and sister; Heart disease in her mother; Hypertension in her father and mother; Miscarriages / Stillbirths in her mother.  Medical History: Past Medical History:  Diagnosis Date   Allergy    Anemia    Anxiety    Arthritis    Asthma    Bipolar 1 disorder (HCC)    Bipolar 1 disorder, mixed, severe (HCC) 12/20/2016   Bipolar I disorder, most recent episode depressed (HCC) 12/20/2016   Complication of anesthesia    hard time waking up    Depression    Diabetes mellitus without complication (HCC)    Drug induced akathisia 04/02/2022   GERD (gastroesophageal reflux disease)    no meds   Hypertension    Kidney stones    Long term current use of antipsychotic medication 03/13/2022   Low back pain    Migraines    Neuromuscular disorder (HCC)    PCOS (polycystic ovarian syndrome)    PONV (postoperative nausea and vomiting)    Schizophrenia (HCC)    Seizure (HCC) 10/03/2021   Seizures (HCC) 06/04/2016   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   Shortness of breath dyspnea     with bronchitis    Surgical History: Past Surgical History:  Procedure Laterality Date   ABDOMINAL HYSTERECTOMY N/A 11/14/2015   Procedure: HYSTERECTOMY ABDOMINAL;  Surgeon: Oneil FORBES Piety, MD;  Location: [facility];  Service: Gynecology;  Laterality: N/A;   ANKLE FUSION Right 05/13/2023   Procedure: ARTHRODESIS ANKLE;  Surgeon: Malvin Marsa FALCON, DPM;  Location: [facility];  Service: Orthopedics/Podiatry;  Laterality: Right;  Hardware removal, right ankle tibiot talo calcaneal arthrodesis   CHOLECYSTECTOMY     FRACTURE SURGERY     INTRAUTERINE DEVICE (IUD) INSERTION  06/14/2014   [facility]   LYMPH NODES REMOVED     ORIF ANKLE FRACTURE Right 05/03/2023   Procedure: OPEN REDUCTION INTERNAL FIXATION (ORIF) ANKLE FRACTURE;  Surgeon: Silva Juliene SAUNDERS, DPM;  Location: [facility];  Service: Orthopedics/Podiatry;  Laterality: Right;   ovarian cyst removed     SALPINGOOPHORECTOMY Bilateral 11/14/2015   Procedure: SALPINGO OOPHORECTOMY;  Surgeon: Oneil FORBES Piety, MD;  Location: [facility];  Service: Gynecology;  Laterality: Bilateral;     Medications:  No current facility-administered medications  for this encounter.  Current Outpatient Medications:    Albuterol -Budesonide  (AIRSUPRA) 90-80 MCG/ACT AERO, , Disp: , Rfl:    Atogepant  (QULIPTA ) 30 MG TABS, Take 1 tablet daily, Disp: 30 tablet, Rfl: 11   Boric Acid 600 MG SUPP, Place 600 mg vaginally at bedtime., Disp: 14 suppository, Rfl: 0   busPIRone  (BUSPAR ) 10 MG tablet, Take 10 mg by mouth 2 (two) times daily., Disp: , Rfl:    cetirizine  (ZYRTEC ) 10 MG tablet, Take 10 mg by mouth at bedtime., Disp: , Rfl:    clonazePAM  (KLONOPIN ) 0.5 MG tablet, Take 1 tablet (0.5 mg total) by mouth 3 (three) times daily as needed for anxiety., Disp: 90 tablet, Rfl: 0   DULoxetine  (CYMBALTA ) 60 MG capsule, Take 2 capsules (120 mg total) by mouth daily., Disp: 180 capsule, Rfl: 0   eletriptan  (RELPAX ) 40 MG tablet, Take 1 tablet at onset of migraine. May  repeat in 2 hours if headache persists or recurs. Do not take more than 3 a week, Disp: 10 tablet, Rfl: 5   empagliflozin  (JARDIANCE ) 25 MG TABS tablet, Take 25 mg by mouth., Disp: , Rfl:    famotidine  (PEPCID ) 40 MG tablet, Take 1 tablet (40 mg total) by mouth 2 (two) times daily., Disp: 180 tablet, Rfl: 0   gabapentin  (NEURONTIN ) 600 MG tablet, Take 1.5 tablets (900 mg total) by mouth 3 (three) times daily for 14 days., Disp: 63 tablet, Rfl: 0   GEMTESA 75 MG TABS, Take 1 tablet by mouth daily., Disp: , Rfl:    JARDIANCE  25 MG TABS tablet, Take 1 tablet (25 mg total) by mouth every morning., Disp: 90 tablet, Rfl: 3   montelukast  (SINGULAIR ) 10 MG tablet, TAKE 1 TABLET BY MOUTH EVERYDAY AT BEDTIME, Disp: 90 tablet, Rfl: 0   naloxone  (NARCAN ) nasal spray 4 mg/0.1 mL, Place 1 spray into the nose as needed for up to 365 doses (for opioid-induced respiratory depresssion). In case of emergency (overdose), spray once into each nostril. If no response within 3 minutes, repeat application and call 911., Disp: 1 each, Rfl: 1   ondansetron  (ZOFRAN -ODT) 4 MG disintegrating tablet, Take 2 tablets (8 mg total) by mouth every 8 (eight) hours as needed for nausea or vomiting., Disp: 30 tablet, Rfl: 0   pantoprazole  (PROTONIX ) 40 MG tablet, TAKE 1 TABLET BY MOUTH EVERY DAY IN THE MORNING, Disp: 90 tablet, Rfl: 1   potassium chloride  (MICRO-K ) 10 MEQ CR capsule, TAKE 1 CAPSULE BY MOUTH EVERY DAY, Disp: 90 capsule, Rfl: 3   QUEtiapine  (SEROQUEL ) 100 MG tablet, Take 100 mg by mouth 2 (two) times daily. Take 100 mg by mouth in AM and 200 mg by mouth in PM, Disp: , Rfl:    Semaglutide ,0.25 or 0.5MG /DOS, (OZEMPIC , 0.25 OR 0.5 MG/DOSE,) 2 MG/3ML SOPN, Inject 0.5 mg into the skin once a week., Disp: 3 mL, Rfl: 1   SEMAGLUTIDE ,0.25 OR 0.5MG /DOS, [place], Inject into the skin., Disp: , Rfl:    SYMBICORT 160-4.5 MCG/ACT inhaler, Inhale 2 puffs into the lungs., Disp: , Rfl:    tiZANidine  (ZANAFLEX ) 4 MG tablet, Take 1 tablet (4 mg  total) by mouth every 8 (eight) hours as needed., Disp: 270 tablet, Rfl: 3   traZODone  (DESYREL ) 150 MG tablet, TAKE 1 TABLET BY MOUTH AT BEDTIME., Disp: 90 tablet, Rfl: 3   zonisamide  (ZONEGRAN ) 100 MG capsule, Take 5 capsules every night, Disp: 150 capsule, Rfl: 11  Allergies: Allergies  Allergen Reactions   Bactrim [Sulfamethoxazole -Trimethoprim] Hives and Itching  Codeine Hives and Itching   Sulfa Antibiotics Itching    Tena Linebaugh, NP

## 2024-05-03 NOTE — Assessment & Plan Note (Addendum)
 She has had a recent change in psychiatrists and does not feel that her depressive symptoms have been taken seriously . Patient reports that she has been having suicidal thoughts and is crashing  right now.   She has requested hospitalization.  Her fiancee [name] is in agreement that her condition has been worsening ad has been advised to take her to ER for assessment

## 2024-05-04 LAB — CBG MONITORING, ED: Glucose-Capillary: 138 mg/dL — ABNORMAL HIGH (ref 70–99)

## 2024-05-04 MED ORDER — ZONISAMIDE 100 MG PO CAPS
500.0000 mg | ORAL_CAPSULE | Freq: Every day | ORAL | Status: DC
  Administered 2024-05-04 – 2024-05-05 (×2): 500 mg via ORAL
  Filled 2024-05-04 (×4): qty 5

## 2024-05-04 MED ORDER — DULOXETINE HCL 60 MG PO CPEP
120.0000 mg | ORAL_CAPSULE | Freq: Every day | ORAL | Status: DC
  Administered 2024-05-04 – 2024-05-06 (×3): 120 mg via ORAL
  Filled 2024-05-04 (×3): qty 2

## 2024-05-04 MED ORDER — CLONAZEPAM 0.5 MG PO TABS
0.5000 mg | ORAL_TABLET | Freq: Three times a day (TID) | ORAL | Status: DC | PRN
  Administered 2024-05-04 – 2024-05-06 (×4): 0.5 mg via ORAL
  Filled 2024-05-04 (×4): qty 1

## 2024-05-04 MED ORDER — ACETAMINOPHEN 500 MG PO TABS
1000.0000 mg | ORAL_TABLET | Freq: Once | ORAL | Status: AC
  Administered 2024-05-04: 1000 mg via ORAL
  Filled 2024-05-04: qty 2

## 2024-05-04 MED ORDER — EMPAGLIFLOZIN 25 MG PO TABS: 25.0000 mg | ORAL_TABLET | Freq: Every morning | ORAL | Status: DC

## 2024-05-04 MED ORDER — NALOXONE HCL 4 MG/0.1ML NA LIQD: 1.0000 | NASAL | Status: DC | PRN

## 2024-05-04 MED ORDER — ELETRIPTAN HYDROBROMIDE 20 MG PO TABS
40.0000 mg | ORAL_TABLET | ORAL | Status: DC | PRN
  Administered 2024-05-05: 40 mg via ORAL
  Filled 2024-05-04 (×4): qty 2

## 2024-05-04 MED ORDER — PANTOPRAZOLE SODIUM 40 MG PO TBEC
40.0000 mg | DELAYED_RELEASE_TABLET | Freq: Every day | ORAL | Status: DC
  Administered 2024-05-04 – 2024-05-06 (×3): 40 mg via ORAL
  Filled 2024-05-04 (×3): qty 1

## 2024-05-04 MED ORDER — EMPAGLIFLOZIN 25 MG PO TABS
25.0000 mg | ORAL_TABLET | Freq: Every day | ORAL | Status: DC
  Administered 2024-05-04 – 2024-05-06 (×3): 25 mg via ORAL
  Filled 2024-05-04 (×3): qty 1

## 2024-05-04 MED ORDER — SEMAGLUTIDE(0.25 OR 0.5MG/DOS) 2 MG/3ML ~~LOC~~ SOPN: 0.5000 mg | PEN_INJECTOR | SUBCUTANEOUS | Status: DC

## 2024-05-04 MED ORDER — MONTELUKAST SODIUM 10 MG PO TABS
10.0000 mg | ORAL_TABLET | Freq: Every day | ORAL | Status: DC
  Administered 2024-05-04 – 2024-05-05 (×2): 10 mg via ORAL
  Filled 2024-05-04 (×2): qty 1

## 2024-05-04 MED ORDER — GABAPENTIN 300 MG PO CAPS
900.0000 mg | ORAL_CAPSULE | Freq: Three times a day (TID) | ORAL | Status: DC
  Administered 2024-05-04 – 2024-05-06 (×5): 900 mg via ORAL
  Filled 2024-05-04 (×5): qty 3

## 2024-05-04 MED ORDER — FAMOTIDINE 20 MG PO TABS
40.0000 mg | ORAL_TABLET | Freq: Two times a day (BID) | ORAL | Status: DC
  Administered 2024-05-04 – 2024-05-06 (×4): 40 mg via ORAL
  Filled 2024-05-04 (×4): qty 2

## 2024-05-04 MED ORDER — ONDANSETRON 4 MG PO TBDP: 8.0000 mg | ORAL_TABLET | Freq: Three times a day (TID) | ORAL | Status: DC | PRN

## 2024-05-04 MED ORDER — TIZANIDINE HCL 2 MG PO TABS
4.0000 mg | ORAL_TABLET | Freq: Three times a day (TID) | ORAL | Status: DC | PRN
Start: 2024-05-04 — End: 2024-05-06

## 2024-05-04 MED ORDER — FLUTICASONE FUROATE-VILANTEROL 100-25 MCG/ACT IN AEPB
1.0000 | INHALATION_SPRAY | Freq: Every day | RESPIRATORY_TRACT | Status: DC
  Filled 2024-05-04: qty 28

## 2024-05-04 MED ORDER — MIRABEGRON ER 25 MG PO TB24
25.0000 mg | ORAL_TABLET | Freq: Every day | ORAL | Status: DC
  Administered 2024-05-04 – 2024-05-06 (×3): 25 mg via ORAL
  Filled 2024-05-04 (×3): qty 1

## 2024-05-04 MED ORDER — POTASSIUM CHLORIDE CRYS ER 20 MEQ PO TBCR
10.0000 meq | EXTENDED_RELEASE_TABLET | Freq: Every day | ORAL | Status: DC
  Administered 2024-05-04 – 2024-05-06 (×3): 10 meq via ORAL
  Filled 2024-05-04 (×3): qty 1

## 2024-05-04 MED ORDER — QUETIAPINE FUMARATE 25 MG PO TABS
100.0000 mg | ORAL_TABLET | Freq: Two times a day (BID) | ORAL | Status: DC
  Administered 2024-05-04 – 2024-05-06 (×4): 100 mg via ORAL
  Filled 2024-05-04 (×4): qty 4

## 2024-05-04 MED ORDER — LORAZEPAM 0.5 MG PO TABS
0.5000 mg | ORAL_TABLET | Freq: Once | ORAL | Status: AC
  Administered 2024-05-04: 0.5 mg via ORAL
  Filled 2024-05-04: qty 1

## 2024-05-04 NOTE — ED Notes (Signed)
 Pt stating no change in pain from the tylenol . Pt also stating she is feeling very anxious, I feel like I'm going to jump outta my skin. Dr Ernest informed.

## 2024-05-04 NOTE — ED Notes (Signed)
 Pt up to the bathroom at this time. Pt uses wheel chair and is able to stand and pivot with standby assist.

## 2024-05-04 NOTE — ED Notes (Signed)
Snacks given to pt.

## 2024-05-04 NOTE — ED Notes (Signed)
 Dinner tray given at this time with beverage of choice.

## 2024-05-04 NOTE — Progress Notes (Signed)
 Per [facility]), there are no appropriate beds within [facility] system due to patient being bed-bound.  Re-faxed patient to the following facilities:  Service Provider Phone  [facility]  [phone]  [facility] Patient Placement  [phone]  [facility]  [phone]  [facility]  [phone]  [facility]  [phone]  [facility]  [phone]  [facility]  [phone]  [facility]  [phone]  [facility]  [phone]  [facility]  [phone]      [facility]  [phone]  [facility]  [phone]  [facility]  [phone]  [facility]  [phone], [place] [phone]

## 2024-05-04 NOTE — ED Notes (Addendum)
 Pt ex husband Andrea [name] to front desk requesting to visit pt, informed of visiting hours. Visitor expressed that pt has been calling stating that she is not receiving meds or meals and is requesting to speak to chief officer. Visitor also requesting to speak to pt at this time. Albino RN made aware and to tell pt of [name]'s request to speak to pt via phone. Data Processing Manager notified of request to speak to production designer, theatre/television/film.  [name] [phone]

## 2024-05-04 NOTE — BH Assessment (Signed)
 Destination  Service Provider Request Status Services Address Phone Fax Patient Preferred  [facility]  Pending - Request Sent -- [address] [place] [address] [phone] --  [facility] Patient Placement  Pending - Request Sent -- [facility] [place] [phone] --  [facility]  Pending - Request Sent -- [address] [place] [address] [phone] --  [facility]  Pending - Request Sent -- [address] [place], [place] [address] [phone] --  [facility]  Pending - Request Sent -- [address] [place] [address] [phone] --  [facility]  Pending - Request Sent -- [address], [place] [address] [phone] --  [facility]  Pending - Request Sent -- [address] [place] [address] [phone] --  [facility]  Pending - Request Sent -- [address] [place] [address] [phone] --  [facility]  Pending - Request Sent -- [place] [phone] --  [facility]  Pending - Request Sent -- [address] [place] [address] [phone] --  [facility]  Pending - Request Sent -- [address] [place] [address] [phone] --  [facility]  Pending - Request Sent -- [address], [place] [address] [phone] --  [facility]  Pending - Request Sent -- [address] [place] [address] [phone] --  [facility]  Pending - Request Sent -- [address] [place] [address] [phone] --

## 2024-05-04 NOTE — ED Notes (Signed)
 This tech assisted pt to the restroom using a wheelchair.

## 2024-05-04 NOTE — ED Notes (Signed)
 Lunch tray provided to pt.

## 2024-05-04 NOTE — ED Notes (Signed)
 Pt given pillow. No further needs at this time.

## 2024-05-04 NOTE — ED Notes (Signed)
 VOL  PENDING  PLACEMENT

## 2024-05-04 NOTE — ED Provider Notes (Signed)
 Emergency Medicine Observation Re-evaluation Note  Andrea [name] is a 43 y.o. female, seen on rounds today.  Pt initially presented to the ED for complaints of Back Pain and Hallucinations Currently, the patient is resting in no distress.  Physical Exam  BP (!) 153/111 (BP Location: Left Arm)   Pulse 92   Temp 98 F (36.7 C) (Oral)   Resp 18   Ht 5' 11 (1.803 m)   Wt 122.5 kg   LMP  (LMP Unknown)   SpO2 95%   BMI 37.66 kg/m  Physical Exam General: Resting in no distress Cardiac: Appears well-perfused Lungs: No respiratory distress Psych: Not agitated  ED Course / MDM  EKG:   I have reviewed the labs performed to date as well as medications administered while in observation.    Plan  Current plan is for inpatient psychiatric care.    Clarine Ozell LABOR, MD 05/04/24 [phone]

## 2024-05-04 NOTE — ED Notes (Signed)
 Called dietary at this time for missing meal tray. Per dietary they will send a tray.

## 2024-05-04 NOTE — ED Notes (Signed)
 Dinner meal given to pt

## 2024-05-04 NOTE — ED Notes (Signed)
 Pt asking for update on plan and MRI. Pt given update by this RN, informed pt recc for inpt treatment however waiting for word on where to go. Pt verbalized understanding and ok with this plan.

## 2024-05-04 NOTE — ED Notes (Signed)
 Received call from First Nurse at this time. Per First Nurse, pt's ex husband Jama was informed by pt that she was not receiving any meals or medications. Per First Nurse Jama wished for pt to call him. Pt given phone at this time.

## 2024-05-04 NOTE — ED Notes (Signed)
 Pt was assisted to the bathroom via wheelchair without any difficulties.

## 2024-05-04 NOTE — ED Notes (Signed)
 Vol /recommend inpatient psych when medically cleared

## 2024-05-05 ENCOUNTER — Encounter: Payer: Self-pay | Admitting: Neurology

## 2024-05-05 ENCOUNTER — Ambulatory Visit: Payer: MEDICAID | Admitting: Neurology

## 2024-05-05 ENCOUNTER — Other Ambulatory Visit (HOSPITAL_COMMUNITY): Payer: Self-pay

## 2024-05-05 ENCOUNTER — Emergency Department: Payer: MEDICAID

## 2024-05-05 LAB — URINE CULTURE
MICRO NUMBER:: 17244164
SPECIMEN QUALITY:: ADEQUATE

## 2024-05-05 LAB — BASIC METABOLIC PANEL WITH GFR
Anion gap: 14 (ref 5–15)
BUN: 13 mg/dL (ref 6–20)
CO2: 24 mmol/L (ref 22–32)
Calcium: 9.4 mg/dL (ref 8.9–10.3)
Chloride: 105 mmol/L (ref 98–111)
Creatinine, Ser: 0.77 mg/dL (ref 0.44–1.00)
GFR, Estimated: >60 mL/min (ref >60)
Glucose, Bld: 123 mg/dL — ABNORMAL HIGH (ref 70–99)
Potassium: 3.9 mmol/L (ref 3.5–5.1)
Sodium: 143 mmol/L (ref 135–145)

## 2024-05-05 LAB — URINALYSIS, MICROSCOPIC ONLY: Hyaline Cast: NONE SEEN /LPF

## 2024-05-05 LAB — SEDIMENTATION RATE: Sed Rate: 6 mm/h (ref 0–20)

## 2024-05-05 LAB — CBC
HCT: 41.6 % (ref 36.0–46.0)
Hemoglobin: 13.1 g/dL (ref 12.0–15.0)
MCH: 29.6 pg (ref 26.0–34.0)
MCHC: 31.5 g/dL (ref 30.0–36.0)
MCV: 94.1 fL (ref 80.0–100.0)
Platelets: 264 K/uL (ref 150–400)
RBC: 4.42 MIL/uL (ref 3.87–5.11)
RDW: 13.3 % (ref 11.5–15.5)
WBC: 8 K/uL (ref 4.0–10.5)
nRBC: 0 % (ref 0.0–0.2)

## 2024-05-05 LAB — CBG MONITORING, ED
Glucose-Capillary: 167 mg/dL — ABNORMAL HIGH (ref 70–99)
Glucose-Capillary: 125 mg/dL — ABNORMAL HIGH (ref 70–99)

## 2024-05-05 MED ORDER — ACETAMINOPHEN 325 MG PO TABS
650.0000 mg | ORAL_TABLET | Freq: Four times a day (QID) | ORAL | Status: DC | PRN
  Administered 2024-05-05 (×2): 650 mg via ORAL
  Filled 2024-05-05: qty 2

## 2024-05-05 NOTE — ED Notes (Signed)
 X-ray at bedside.

## 2024-05-05 NOTE — ED Notes (Signed)
 This RN assisted pt to restroom using wheelchair at this time.

## 2024-05-05 NOTE — ED Provider Notes (Addendum)
 Patient requesting Tylenol  for mild headache.  History of headaches.  No acute distress, will provide Tylenol .   Andrea Anes, MD 05/05/24 [phone]   ----------------------------------------- 2:40 PM on 05/05/2024 ----------------------------------------- RN reports patient having some redness and drainage from foot, per RN just a few minutes ago.  This was conveyed to me, and I did pass this information along so that my oncoming partner Dr. Fernand would evaluate this concern further. Dr. Fernand taking over care.    Andrea Anes, MD 05/05/24 [phone]

## 2024-05-05 NOTE — ED Provider Notes (Signed)
 Emergency Medicine Observation Re-evaluation Note  Andrea [name] is a 43 y.o. female, seen on rounds today.  Pt initially presented to the ED for complaints of Back Pain and Hallucinations  Currently, the patient is is no acute distress. Denies any concerns at this time.  Physical Exam  Blood pressure (!) 158/109, pulse 93, temperature 98 F (36.7 C), temperature source Oral, resp. rate 17, height 5' 11 (1.803 m), weight 122.5 kg, SpO2 95%.  Physical Exam: General: No apparent distress Pulm: Normal WOB Neuro: Moving all extremities Psych: Resting comfortably     ED Course / MDM     I have reviewed the labs performed to date as well as medications administered while in observation.  Recent changes in the last 24 hours include: No acute events overnight.  Plan   Current plan: Patient awaiting psychiatric disposition.     [name], [name], DO 05/05/24 [phone]

## 2024-05-05 NOTE — BH Assessment (Signed)
 Writer received call from Vertell at [facility] who was requesting a Covid test be faxed to [phone] for review.

## 2024-05-05 NOTE — ED Notes (Signed)
 VOL  PENDING  PLACEMENT

## 2024-05-05 NOTE — ED Notes (Signed)
 This RN called pharmacy regarding eletriptan  medication not properly scanning. Pharmacy tech sent new medication.

## 2024-05-05 NOTE — Progress Notes (Signed)
 There are no appropriate beds within [facility] system due to patient being bed-bound. Re-faxed patient to the following facilities:    Service Provider Phone  [facility]  [phone]  [facility] Patient Placement  [phone]  [facility]  [phone]  [facility]  [phone]  [facility]  [phone]  [facility]  [phone]  [facility]  [phone]  [facility]  [phone]  [facility]  [phone]  [facility]  [phone]      [facility]  [phone]  [facility]  [phone]  [facility]  [phone]  [facility]  [phone], [place] [phone]

## 2024-05-05 NOTE — ED Notes (Signed)
 Pt's fiance is bedside with pt.

## 2024-05-05 NOTE — BH Assessment (Addendum)
 Per Annabella, patient was declined by [facility]

## 2024-05-05 NOTE — ED Notes (Signed)
 Lunch tray provided to pt.

## 2024-05-05 NOTE — ED Provider Notes (Signed)
 Patient with complaint of foot pain along the site of her recent surgery.  Recently postop from a rod removal done at [facility] at the end of October.  Has been recovering well, this evening started noticing some pain along her foot and she had had some discharge coming from the area of the surgical site prior to her arrival here initially.  While here she has otherwise been doing well.  There is some slight erythema along the site of the recent surgery and some scant yellowish discharge appreciated.  I am obtaining x-ray of the foot and basic labs and will reach out to [facility] orthopedic surgery to discuss management and recommendations.  Surgery was done by Dr. Louvella from podiatry.  2133 - Reached out to [facility] podiatry team for recommendations.  2157 - Spoke with Dr. Forrestine from orthopedic surgery at [facility], he is going to reach out to Dr. Louvella in the morning to determine an appropriate follow-up plan for the patient.  For now does not feel that the patient needs transfer but more likely urgent clinic follow-up would be reasonable.  Their team will reach out to us  tomorrow morning to discuss ultimate plan and disposition he does request ESR and CRP that can be discussed with results in the morning.   Fernand Rossie HERO, MD 05/05/24 [phone]

## 2024-05-05 NOTE — ED Notes (Signed)
 Patient given supplies and going to shower. Requiring a shower chair due to impaired mobility.

## 2024-05-05 NOTE — ED Notes (Signed)
 Dinner tray provided to pt

## 2024-05-05 NOTE — ED Notes (Signed)
 Pt assisted to the bathroom. Assessed vitals once pt was finished. No further needs at this time.

## 2024-05-05 NOTE — ED Notes (Signed)
 Snack provided

## 2024-05-05 NOTE — BH Assessment (Signed)
 Per Olam, patient was declined from [facility].

## 2024-05-05 NOTE — ED Notes (Signed)
 Patient reports redness and purulent drainage to R heel where pt states she had previously had pins. MD Quale made aware.

## 2024-05-06 ENCOUNTER — Ambulatory Visit: Payer: Self-pay | Admitting: Internal Medicine

## 2024-05-06 LAB — CBG MONITORING, ED: Glucose-Capillary: 133 mg/dL — ABNORMAL HIGH (ref 70–99)

## 2024-05-06 LAB — RESP PANEL BY RT-PCR (RSV, FLU A&B, COVID)  RVPGX2
Influenza A by PCR: NEGATIVE
Influenza B by PCR: NEGATIVE
Resp Syncytial Virus by PCR: NEGATIVE
SARS Coronavirus 2 by RT PCR: NEGATIVE

## 2024-05-06 LAB — C-REACTIVE PROTEIN: CRP: 0.8 mg/dL (ref <1.0)

## 2024-05-06 MED ORDER — AMOXICILLIN-POT CLAVULANATE 875-125 MG PO TABS: 1.0000 | ORAL_TABLET | Freq: Two times a day (BID) | ORAL | 0 refills | Status: DC

## 2024-05-06 NOTE — ED Notes (Signed)
 This RN spoke with representative from [facility] to answer additional questions in regard to bed pending

## 2024-05-06 NOTE — ED Notes (Signed)
EMTALA reviewed. 

## 2024-05-06 NOTE — ED Notes (Signed)
 RN verified with diplomatic services operational officer and EDP that pt is voluntary - IVC order discontinued

## 2024-05-06 NOTE — ED Provider Notes (Signed)
 Emergency Medicine Observation Re-evaluation Note   BP 112/73 (BP Location: Right Arm)   Pulse 90   Temp 98 F (36.7 C) (Oral)   Resp 18   Ht 5' 11 (1.803 m)   Wt 122.5 kg   LMP  (LMP Unknown)   SpO2 93%   BMI 37.66 kg/m    ED Course / MDM   No reported events during my shift at the time of this note.   Pt is awaiting dispo from consultants   Ginnie Shams MD    Shams Ginnie, MD 05/06/24 [phone]

## 2024-05-06 NOTE — ED Notes (Signed)
 Pt given breakfast tray

## 2024-05-06 NOTE — Progress Notes (Signed)
 [facility] followed up with Cloyd at [facility] regarding pending placement.  Randi confirmed patient was reviewed yesterday but still waiting on covid test.  [facility] spoke to [employer] who will place order for Covid.  Clancy, [facility] [phone]

## 2024-05-06 NOTE — ED Notes (Signed)
Pt gave verbal consent to transfer.

## 2024-05-06 NOTE — Addendum Note (Signed)
 Addended by: MARYLYNN VERNEITA CROME on: 05/06/2024 08:43 PM   Modules accepted: Orders

## 2024-05-06 NOTE — Progress Notes (Addendum)
 Patient has been accepted to [facility] for 05/06/24 Patient was assigned to Geriatric Unit. Accepting physician is Prentice Balloon, DO. Call report to [phone]. Representative was [employer].   ER Staff is aware of it: Olam, ER Secretary Burnard RN, Patient's Nurse   Address:  [address]                 [place], [place] [address]

## 2024-05-06 NOTE — ED Provider Notes (Addendum)
 I discussed with [facility] transfer center. Dr. Kerzner did not feel patient required transfer from a podiatric standpoint and this was relayed to me via the transfer center, though I did not discuss with Dr. Louvella personally.   I evaluate patient's foot and ankle and have attached an image below.  Does not appear to be acutely infectious.  ESR and CRP returned normal.  Patient has psychiatric disposition at [facility] and I think this would be reasonable and she could follow-up with podiatry as an outpatient.     Claudene Rover, MD 05/06/24 1234    Claudene Rover, MD 05/06/24 [phone]

## 2024-05-06 NOTE — ED Notes (Signed)
Pt assisted to restroom in wheelchair.

## 2024-05-08 ENCOUNTER — Other Ambulatory Visit: Payer: Self-pay | Admitting: Nurse Practitioner

## 2024-05-08 DIAGNOSIS — E1142 Type 2 diabetes mellitus with diabetic polyneuropathy: Secondary | ICD-10-CM

## 2024-05-11 ENCOUNTER — Encounter: Payer: MEDICAID | Admitting: Nurse Practitioner

## 2024-05-11 NOTE — Telephone Encounter (Signed)
 Katilyn has TOC appointment with Leron today.  Please advise if okay to refill or titrate up to next dose.

## 2024-05-12 ENCOUNTER — Telehealth: Payer: Self-pay

## 2024-05-12 NOTE — Transitions of Care (Post Inpatient/ED Visit) (Signed)
 05/12/2024  Name: Andrea [name] MRN: [id number] DOB: 1981-05-08  Today's TOC FU Call Status: Today's TOC FU Call Status:: Successful TOC FU Call Completed TOC FU Call Complete Date: 05/11/24  Patient's Name and Date of Birth confirmed. Name, DOB  Transition Care Management Follow-up Telephone Call Date of Discharge: 05/11/24 Discharge Facility: Other [facility]) Name of Other ([facility]) Discharge Facility: [facility] Type of Discharge: Inpatient Admission Primary Inpatient Discharge Diagnosis:: depression How have you been since you were released from the hospital?: Better Any questions or concerns?: No  Items Reviewed: Did you receive and understand the discharge instructions provided?: Yes Medications obtained,verified, and reconciled?: Yes (Medications Reviewed) Any new allergies since your discharge?: No Dietary orders reviewed?: Yes Do you have support at home?: Yes People in Home [RPT]: significant other  Medications Reviewed Today: Medications Reviewed Today     Reviewed by Emmitt Pan, LPN (Licensed Practical Nurse) on 05/12/24 at 0911  Med List Status: <None>   Medication Order Taking? Sig Documenting Provider Last Dose Status Informant  Albuterol -Budesonide  (AIRSUPRA) 90-80 MCG/ACT AERO [id number] Yes  [provider]  Active Self  amoxicillin -clavulanate (AUGMENTIN ) 875-125 MG tablet [id number] Yes Take 1 tablet by mouth 2 (two) times daily. Marylynn Verneita CROME, MD  Active   Atogepant  (QULIPTA ) 30 MG TABS [id number] Yes Take 1 tablet daily Georjean Darice HERO, MD  Active Self  Boric Acid 600 MG SUPP [id number] Yes Place 600 mg vaginally at bedtime. Wellington Curtis LABOR, FNP  Active Self  buPROPion (WELLBUTRIN XL) 150 MG 24 hr tablet [id number] Yes Take 150 mg by mouth daily. [provider]  Active   busPIRone  (BUSPAR ) 10 MG tablet [id number] Yes Take 10 mg by mouth 2 (two) times daily. [provider]  Active Self  cetirizine  (ZYRTEC ) 10 MG  tablet [id number] Yes Take 10 mg by mouth at bedtime. [provider]  Active Self  clonazePAM  (KLONOPIN ) 0.5 MG tablet [id number] Yes Take 1 tablet (0.5 mg total) by mouth 3 (three) times daily as needed for anxiety. Gretel App, NP  Active Self  DULoxetine  (CYMBALTA ) 60 MG capsule [id number] Yes Take 2 capsules (120 mg total) by mouth daily. Barbra Jayson LABOR, MD  Active Self  eletriptan  (RELPAX ) 40 MG tablet [id number] Yes Take 1 tablet at onset of migraine. May repeat in 2 hours if headache persists or recurs. Do not take more than 3 a week Georjean Darice HERO, MD  Active Self  empagliflozin  (JARDIANCE ) 25 MG TABS tablet [id number] Yes Take 25 mg by mouth. [provider]  Active Self  famotidine  (PEPCID ) 40 MG tablet [id number] Yes Take 1 tablet (40 mg total) by mouth 2 (two) times daily. Vincente Saber, NP  Active Self  gabapentin  (NEURONTIN ) 600 MG tablet [id number]  Take 1.5 tablets (900 mg total) by mouth 3 (three) times daily for 14 days. Levander Slate, MD  Expired 05/04/24 2359 Self  GEMTESA 75 MG TABS [id number] Yes Take 1 tablet by mouth daily. [provider]  Active Self  hydrOXYzine  (VISTARIL ) 50 MG capsule [id number] Yes Take 50 mg by mouth every 4 (four) hours as needed for anxiety. [provider]  Active   JARDIANCE  25 MG TABS tablet [id number] Yes Take 1 tablet (25 mg total) by mouth every morning. Vincente Saber, NP  Active Self  montelukast  (SINGULAIR ) 10 MG tablet [id number] Yes TAKE 1 TABLET BY MOUTH EVERYDAY AT BEDTIME Kaur, Charanpreet, NP  Active Self  naloxone  (NARCAN ) nasal spray 4 mg/0.1 mL [id number] Yes Place 1 spray  into the nose as needed for up to 365 doses (for opioid-induced respiratory depresssion). In case of emergency (overdose), spray once into each nostril. If no response within 3 minutes, repeat application and call 911. Tanya Glisson, MD  Active Self  ondansetron  (ZOFRAN -ODT) 4 MG disintegrating tablet [id number] Yes Take 2 tablets  (8 mg total) by mouth every 8 (eight) hours as needed for nausea or vomiting. Corlis Burnard DEL, NP  Active Self  pantoprazole  (PROTONIX ) 40 MG tablet [id number] Yes TAKE 1 TABLET BY MOUTH EVERY DAY IN THE ERMALINDA Aurora, Chelsea, NP  Active Self  potassium chloride  (MICRO-K ) 10 MEQ CR capsule [id number] Yes TAKE 1 CAPSULE BY MOUTH EVERY DAY Gretel App, NP  Active Self  QUEtiapine  (SEROQUEL ) 100 MG tablet [id number] Yes Take 100 mg by mouth 2 (two) times daily. Take 100 mg by mouth in AM and 200 mg by mouth in PM  Patient taking differently: Take 100 mg by mouth at bedtime. Take 100 mg by mouth in AM and 200 mg by mouth in PM   [provider]  Active Self  Semaglutide , 1 MG/DOSE, 4 MG/3ML SOPN [id number] Yes Inject 1 mg as directed once a week. Gretel App, NP  Active   [facility] 160-4.5 MCG/ACT inhaler [id number] Yes Inhale 2 puffs into the lungs. [provider]  Active Self  tiZANidine  (ZANAFLEX ) 4 MG tablet [id number] Yes Take 1 tablet (4 mg total) by mouth every 8 (eight) hours as needed. Gretel App, NP  Active Self  traZODone  (DESYREL ) 150 MG tablet [id number] Yes TAKE 1 TABLET BY MOUTH AT BEDTIME. Gretel App, NP  Active Self  zonisamide  (ZONEGRAN ) 100 MG capsule [id number] Yes Take 5 capsules every night Georjean Darice HERO, MD  Active Self  Med List Note Broadus Reda CROME, RN 04/23/24 9047): 04-23-24 Surgeon's letter for post op pain management faxed to Dr. Ozell Covey. DW            Home Care and Equipment/Supplies: Were Home Health Services Ordered?: NA Any new equipment or medical supplies ordered?: NA  Functional Questionnaire: Do you need assistance with bathing/showering or dressing?: No Do you need assistance with meal preparation?: No Do you need assistance with eating?: No Do you have difficulty maintaining continence: No Do you need assistance with getting out of bed/getting out of a chair/moving?: No Do you have difficulty managing or taking your  medications?: No  Follow up appointments reviewed: PCP Follow-up appointment confirmed?: Yes Date of PCP follow-up appointment?: 05/19/24 Follow-up Provider: [facility] Follow-up appointment confirmed?: Yes Date of Specialist follow-up appointment?: 05/17/24 Follow-Up Specialty Provider:: Merrily Do you need transportation to your follow-up appointment?: No Do you understand care options if your condition(s) worsen?: Yes-patient verbalized understanding    SIGNATURE Julian Lemmings, LPN [facility] Nurse Health Advisor Direct Dial [phone]

## 2024-05-19 ENCOUNTER — Encounter: Payer: Self-pay | Admitting: Nurse Practitioner

## 2024-05-19 ENCOUNTER — Ambulatory Visit: Payer: MEDICAID | Admitting: Nurse Practitioner

## 2024-05-19 VITALS — BP 131/87 | HR 90 | Temp 98.0°F | Ht 71.0 in | Wt 273.8 lb

## 2024-05-19 DIAGNOSIS — F410 Panic disorder [episodic paroxysmal anxiety] without agoraphobia: Secondary | ICD-10-CM

## 2024-05-19 DIAGNOSIS — F411 Generalized anxiety disorder: Secondary | ICD-10-CM

## 2024-05-19 DIAGNOSIS — Z7985 Long-term (current) use of injectable non-insulin antidiabetic drugs: Secondary | ICD-10-CM | POA: Diagnosis not present

## 2024-05-19 DIAGNOSIS — F250 Schizoaffective disorder, bipolar type: Secondary | ICD-10-CM

## 2024-05-19 DIAGNOSIS — E1142 Type 2 diabetes mellitus with diabetic polyneuropathy: Secondary | ICD-10-CM | POA: Diagnosis not present

## 2024-05-19 DIAGNOSIS — F332 Major depressive disorder, recurrent severe without psychotic features: Secondary | ICD-10-CM

## 2024-05-19 DIAGNOSIS — J069 Acute upper respiratory infection, unspecified: Secondary | ICD-10-CM | POA: Diagnosis not present

## 2024-05-19 DIAGNOSIS — G894 Chronic pain syndrome: Secondary | ICD-10-CM | POA: Diagnosis not present

## 2024-05-19 DIAGNOSIS — Z7984 Long term (current) use of oral hypoglycemic drugs: Secondary | ICD-10-CM | POA: Diagnosis not present

## 2024-05-19 LAB — POCT GLYCOSYLATED HEMOGLOBIN (HGB A1C)
HbA1c POC (<> result, manual entry): 6.2 % (ref 4.0–5.6)
HbA1c, POC (controlled diabetic range): 6.2 % (ref 0.0–7.0)
HbA1c, POC (prediabetic range): 6.2 % (ref 5.7–6.4)
Hemoglobin A1C: 6.2 % — AB (ref 4.0–5.6)

## 2024-05-19 MED ORDER — HYDROXYZINE PAMOATE 50 MG PO CAPS: 50.0000 mg | ORAL_CAPSULE | ORAL | 2 refills | Status: AC | PRN

## 2024-05-19 MED ORDER — BUSPIRONE HCL 10 MG PO TABS: 10.0000 mg | ORAL_TABLET | Freq: Three times a day (TID) | ORAL | 2 refills | Status: AC

## 2024-05-19 MED ORDER — GABAPENTIN 600 MG PO TABS: 600.0000 mg | ORAL_TABLET | Freq: Three times a day (TID) | ORAL | 2 refills | Status: AC

## 2024-05-19 MED ORDER — CLONAZEPAM 0.5 MG PO TABS: 0.5000 mg | ORAL_TABLET | Freq: Two times a day (BID) | ORAL | 2 refills | Status: DC | PRN

## 2024-05-19 MED ORDER — FREESTYLE LIBRE 3 PLUS SENSOR MISC: 11 refills | Status: DC

## 2024-05-19 MED ORDER — CELECOXIB 200 MG PO CAPS: 200.0000 mg | ORAL_CAPSULE | Freq: Two times a day (BID) | ORAL | 3 refills | Status: DC

## 2024-05-19 MED ORDER — BUPROPION HCL ER (XL) 150 MG PO TB24: 150.0000 mg | ORAL_TABLET | Freq: Every day | ORAL | 2 refills | Status: DC

## 2024-05-19 NOTE — Progress Notes (Unsigned)
 Andrea Glance, NP-C Phone: [phone]  Andrea [name] is a 43 y.o. female who presents today for hospital follow up.   Discussed the use of AI scribe software for clinical note transcription with the patient, who gave verbal consent to proceed.  History of Present Illness   Andrea [name] is a 43 year old female who presents for a hospital follow-up after a recent discharge.  She was hospitalized from November 17th to November 25th, initially admitted to [facility] and later transferred to [facility]. Since discharge, she feels much better. During her hospital stay, her medications were adjusted, and she is now on Wellbutrin  XL 150 mg, hydroxyzine  50 mg every four hours, Buspar  10 mg three times a day, gabapentin  600 mg three times a day, clonazepam  twice a day, Seroquel , Cymbalta , and Trescon. She notes a significant improvement in her anxiety and depression since starting these medications.  She is concerned about running out of her medications before her next psychiatric appointment on January 9th, as she will be out of gabapentin  in two days. She was also prescribed Celebrex  200 mg twice a day for back pain during her hospital stay and finds it effective.  She has been using a Dexacom for blood sugar monitoring but reports issues with it falling off. She has not been checking her blood sugar manually but notes that her blood sugar was high during her hospital stay. She is currently on Ozempic  1 mg and Jardiance  for blood sugar control. No episodes of hypoglycemia, but she reports excessive thirst. Her A1c was recently checked and is now 6.2; previously, it was 7.1.  She reports symptoms of an upper respiratory infection that started two days ago, including otalgia, pharyngitis, nasal drainage, congestion, and fluctuating fevers. She has been taking acetaminophen  for symptom relief. No dyspnea or chest congestion, but she reports myalgia and fatigue, having slept intermittently all day  yesterday.      Social History   Tobacco Use  Smoking Status Never  Smokeless Tobacco Never    Current Outpatient Medications on File Prior to Visit  Medication Sig Dispense Refill   Albuterol -Budesonide  (AIRSUPRA) 90-80 MCG/ACT AERO      Atogepant  (QULIPTA ) 30 MG TABS Take 1 tablet daily 30 tablet 11   cetirizine  (ZYRTEC ) 10 MG tablet Take 10 mg by mouth at bedtime.     DULoxetine  (CYMBALTA ) 60 MG capsule Take 2 capsules (120 mg total) by mouth daily. 180 capsule 0   eletriptan  (RELPAX ) 40 MG tablet Take 1 tablet at onset of migraine. May repeat in 2 hours if headache persists or recurs. Do not take more than 3 a week 10 tablet 5   empagliflozin  (JARDIANCE ) 25 MG TABS tablet Take 25 mg by mouth.     famotidine  (PEPCID ) 40 MG tablet Take 1 tablet (40 mg total) by mouth 2 (two) times daily. 180 tablet 0   GEMTESA 75 MG TABS Take 1 tablet by mouth daily.     JARDIANCE  25 MG TABS tablet Take 1 tablet (25 mg total) by mouth every morning. 90 tablet 3   montelukast  (SINGULAIR ) 10 MG tablet TAKE 1 TABLET BY MOUTH EVERYDAY AT BEDTIME 90 tablet 0   naloxone  (NARCAN ) nasal spray 4 mg/0.1 mL Place 1 spray into the nose as needed for up to 365 doses (for opioid-induced respiratory depresssion). In case of emergency (overdose), spray once into each nostril. If no response within 3 minutes, repeat application and call 911. 1 each 1   ondansetron  (ZOFRAN -ODT) 4 MG disintegrating  tablet Take 2 tablets (8 mg total) by mouth every 8 (eight) hours as needed for nausea or vomiting. 30 tablet 0   pantoprazole  (PROTONIX ) 40 MG tablet TAKE 1 TABLET BY MOUTH EVERY DAY IN THE MORNING 90 tablet 1   potassium chloride  (MICRO-K ) 10 MEQ CR capsule TAKE 1 CAPSULE BY MOUTH EVERY DAY 90 capsule 3   QUEtiapine  (SEROQUEL ) 100 MG tablet Take 100 mg by mouth 2 (two) times daily. Take 100 mg by mouth in AM and 200 mg by mouth in PM (Patient taking differently: Take 100 mg by mouth at bedtime. Take 100 mg by mouth in AM and  200 mg by mouth in PM)     Semaglutide , 1 MG/DOSE, 4 MG/3ML SOPN Inject 1 mg as directed once a week. 3 mL 1   SYMBICORT 160-4.5 MCG/ACT inhaler Inhale 2 puffs into the lungs.     tiZANidine  (ZANAFLEX ) 4 MG tablet Take 1 tablet (4 mg total) by mouth every 8 (eight) hours as needed. 270 tablet 3   traZODone  (DESYREL ) 150 MG tablet TAKE 1 TABLET BY MOUTH AT BEDTIME. 90 tablet 3   zonisamide  (ZONEGRAN ) 100 MG capsule Take 5 capsules every night 150 capsule 11   No current facility-administered medications on file prior to visit.     ROS see history of present illness  Objective  Physical Exam Vitals:   05/19/24 1053  BP: 131/87  Pulse: 90  Temp: 98 F (36.7 C)  SpO2: 95%    BP Readings from Last 3 Encounters:  05/19/24 131/87  05/06/24 (!) 140/80  05/03/24 136/86   Wt Readings from Last 3 Encounters:  05/19/24 273 lb 12.8 oz (124.2 kg)  05/03/24 270 lb (122.5 kg)  05/03/24 270 lb (122.5 kg)    Physical Exam Constitutional:      General: She is not in acute distress.    Appearance: Normal appearance.  HENT:     Head: Normocephalic.     Right Ear: Tympanic membrane normal.     Left Ear: Tympanic membrane normal.     Nose: Congestion present.     Mouth/Throat:     Mouth: Mucous membranes are moist.     Pharynx: Oropharynx is clear. Posterior oropharyngeal erythema present.  Eyes:     Conjunctiva/sclera: Conjunctivae normal.     Pupils: Pupils are equal, round, and reactive to light.  Cardiovascular:     Rate and Rhythm: Normal rate and regular rhythm.     Heart sounds: Normal heart sounds.  Pulmonary:     Effort: Pulmonary effort is normal.     Breath sounds: Normal breath sounds.  Lymphadenopathy:     Cervical: No cervical adenopathy.  Skin:    General: Skin is warm and dry.  Neurological:     General: No focal deficit present.     Mental Status: She is alert.  Psychiatric:        Mood and Affect: Mood normal.        Behavior: Behavior normal.       Assessment/Plan: Please see individual problem list.  Assessment and Plan    Hospital Follow-up   Her anxiety and depression have significantly improved with the new medication regimen. Medication refills for Wellbutrin , Atarax , Buspar , and gabapentin  are ensured until the January 9th psychiatrist appointment. She will continue taking Seroquel , Cymbalta , and Trescon.  Type 2 diabetes mellitus with diabetic polyneuropathy   A1c has improved to 6.2 from 7.1 with no hypoglycemic episodes, though she reports excessive thirst. The Dexcom  is malfunctioning. Continue Ozempic  1 mg and Jardiance . A1c was checked with a finger stick, and she has switched to [place] 3 for glucose monitoring.  Schizoaffective disorder, bipolar type with comorbid depression and generalized anxiety disorder   There is significant improvement in anxiety and depression with recent medication adjustments. She has no current thoughts of self-harm or harm to others. Continue the current psychiatric medication regimen and monitor mood changes due to recent medication adjustments.  Chronic pain syndrome   She prefers Celebrex  over other anti-inflammatories and is advised against concurrent use of other anti-inflammatories. Celebrex  200 mg is prescribed twice daily, and she is instructed to avoid other NSAIDs such as ibuprofen , Aleve , and meloxicam .  Acute upper respiratory infection   The infection is likely viral, and no antibiotics are initiated as symptoms are not yet a week old. She is advised to use over-the-counter medications such as Mucinex  and Robitussin for symptom relief and to monitor symptoms, reporting if there is no improvement in one week.       Viral URI with cough  Major depressive disorder, recurrent severe without psychotic features (HCC) -     buPROPion  HCl ER (XL); Take 1 tablet (150 mg total) by mouth daily.  Dispense: 30 tablet; Refill: 2  Schizoaffective disorder, bipolar type (HCC) -      hydrOXYzine  Pamoate; Take 1 capsule (50 mg total) by mouth every 4 (four) hours as needed for anxiety.  Dispense: 180 capsule; Refill: 2 -     buPROPion  HCl ER (XL); Take 1 tablet (150 mg total) by mouth daily.  Dispense: 30 tablet; Refill: 2 -     busPIRone  HCl; Take 1 tablet (10 mg total) by mouth 3 (three) times daily.  Dispense: 90 tablet; Refill: 2 -     clonazePAM ; Take 1 tablet (0.5 mg total) by mouth 2 (two) times daily as needed for anxiety.  Dispense: 60 tablet; Refill: 2  Type 2 diabetes mellitus with peripheral neuropathy (HCC) -     POCT glycosylated hemoglobin (Hb A1C) -     FreeStyle Libre 3 Plus Sensor; Change sensor every 15 days.  Dispense: 2 each; Refill: 11  Generalized anxiety disorder with panic attacks -     hydrOXYzine  Pamoate; Take 1 capsule (50 mg total) by mouth every 4 (four) hours as needed for anxiety.  Dispense: 180 capsule; Refill: 2 -     busPIRone  HCl; Take 1 tablet (10 mg total) by mouth 3 (three) times daily.  Dispense: 90 tablet; Refill: 2 -     clonazePAM ; Take 1 tablet (0.5 mg total) by mouth 2 (two) times daily as needed for anxiety.  Dispense: 60 tablet; Refill: 2  Chronic pain syndrome -     Gabapentin ; Take 1 tablet (600 mg total) by mouth 3 (three) times daily.  Dispense: 90 tablet; Refill: 2 -     Celecoxib ; Take 1 capsule (200 mg total) by mouth 2 (two) times daily.  Dispense: 180 capsule; Refill: 3     Health Maintenance: ***  No follow-ups on file.   Andrea Glance, NP-C [facility]

## 2024-05-24 ENCOUNTER — Telehealth: Payer: MEDICAID | Admitting: Physician Assistant

## 2024-05-24 DIAGNOSIS — B9689 Other specified bacterial agents as the cause of diseases classified elsewhere: Secondary | ICD-10-CM

## 2024-05-24 MED ORDER — IPRATROPIUM BROMIDE 0.03 % NA SOLN: 2.0000 | Freq: Two times a day (BID) | NASAL | 0 refills | Status: DC

## 2024-05-24 MED ORDER — AMOXICILLIN-POT CLAVULANATE 875-125 MG PO TABS: 1.0000 | ORAL_TABLET | Freq: Two times a day (BID) | ORAL | 0 refills | Status: DC

## 2024-05-24 NOTE — Progress Notes (Signed)
 E-Visit for Sinus Problems  We are sorry that you are not feeling well.  Here is how we plan to help!  Based on what you have shared with me it looks like you have sinusitis.  Sinusitis is inflammation and infection in the sinus cavities of the head.  Based on your presentation I believe you most likely have Acute Bacterial Sinusitis.  This is an infection caused by bacteria and is treated with antibiotics. I have prescribed Augmentin  875mg /125mg  one tablet twice daily with food, for 7 days. and I have also prescribed Ipratropium Bromide Nasal Spray Use 1 spray in each nostril twice daily as needed for drainage; discontinue if too drying You may use an oral decongestant such as Mucinex D or if you have glaucoma or high blood pressure use plain Mucinex. Saline nasal spray help and can safely be used as often as needed for congestion.  If you develop worsening sinus pain, fever or notice severe headache and vision changes, or if symptoms are not better after completion of antibiotic, please schedule an appointment with a health care provider.    Sinus infections are not as easily transmitted as other respiratory infection, however we still recommend that you avoid close contact with loved ones, especially the very young and elderly.  Remember to wash your hands thoroughly throughout the day as this is the number one way to prevent the spread of infection!  Home Care: Only take medications as instructed by your medical team. Complete the entire course of an antibiotic. Do not take these medications with alcohol. A steam or ultrasonic humidifier can help congestion.  You can place a towel over your head and breathe in the steam from hot water coming from a faucet. Avoid close contacts especially the very young and the elderly. Cover your mouth when you cough or sneeze. Always remember to wash your hands.  Get Help Right Away If: You develop worsening fever or sinus pain. You develop a severe head  ache or visual changes. Your symptoms persist after you have completed your treatment plan.  Make sure you Understand these instructions. Will watch your condition. Will get help right away if you are not doing well or get worse.  Your e-visit answers were reviewed by a board certified advanced clinical practitioner to complete your personal care plan.  Depending on the condition, your plan could have included both over the counter or prescription medications.  If there is a problem please reply  once you have received a response from your provider.  Your safety is important to us .  If you have drug allergies check your prescription carefully.    You can use MyChart to ask questions about today's visit, request a non-urgent call back, or ask for a work or school excuse for 24 hours related to this e-Visit. If it has been greater than 24 hours you will need to follow up with your provider, or enter a new e-Visit to address those concerns.  You will get an e-mail in the next two days asking about your experience.  I hope that your e-visit has been valuable and will speed your recovery. Thank you for using e-visits.  I have spent 5 minutes in review of e-visit questionnaire, review and updating patient chart, medical decision making and response to patient.   Elsie Velma Lunger, PA-C

## 2024-05-28 ENCOUNTER — Telehealth: Payer: MEDICAID | Admitting: Physician Assistant

## 2024-05-28 DIAGNOSIS — G43811 Other migraine, intractable, with status migrainosus: Secondary | ICD-10-CM | POA: Diagnosis not present

## 2024-05-28 MED ORDER — PREDNISONE 10 MG (21) PO TBPK: ORAL_TABLET | ORAL | 0 refills | Status: DC

## 2024-05-28 NOTE — Patient Instructions (Signed)
 Andrea [name], thank you for joining Andrea CHRISTELLA Dickinson, Andrea [name] for today's virtual visit.  While this provider is not your primary care provider (PCP), if your PCP is located in our provider database this encounter information will be shared with them immediately following your visit.   A [facility] MyChart account gives you access to today's visit and all your visits, tests, and labs performed at [facility]  click here if you don't have a [facility] MyChart account or go to [email or web address]  Consent: (Patient) Andrea [name] provided verbal consent for this virtual visit at the beginning of the encounter.  Current Medications:  Current Outpatient Medications:    predniSONE  (STERAPRED UNI-PAK 21 TAB) 10 MG (21) TBPK tablet, 6 day taper; take as directed on package instructions, Disp: 21 tablet, Rfl: 0   Albuterol -Budesonide  (AIRSUPRA) 90-80 MCG/ACT AERO, , Disp: , Rfl:    amoxicillin -clavulanate (AUGMENTIN ) 875-125 MG tablet, Take 1 tablet by mouth 2 (two) times daily., Disp: 14 tablet, Rfl: 0   Atogepant  (QULIPTA ) 30 MG TABS, Take 1 tablet daily, Disp: 30 tablet, Rfl: 11   buPROPion  (WELLBUTRIN  XL) 150 MG 24 hr tablet, Take 1 tablet (150 mg total) by mouth daily., Disp: 30 tablet, Rfl: 2   busPIRone  (BUSPAR ) 10 MG tablet, Take 1 tablet (10 mg total) by mouth 3 (three) times daily., Disp: 90 tablet, Rfl: 2   celecoxib  (CELEBREX ) 200 MG capsule, Take 1 capsule (200 mg total) by mouth 2 (two) times daily., Disp: 180 capsule, Rfl: 3   cetirizine  (ZYRTEC ) 10 MG tablet, Take 10 mg by mouth at bedtime., Disp: , Rfl:    clonazePAM  (KLONOPIN ) 0.5 MG tablet, Take 1 tablet (0.5 mg total) by mouth 2 (two) times daily as needed for anxiety., Disp: 60 tablet, Rfl: 2   Continuous Glucose Sensor (FREESTYLE LIBRE 3 PLUS SENSOR) MISC, Change sensor every 15 days., Disp: 2 each, Rfl: 11   DULoxetine  (CYMBALTA ) 60 MG capsule, Take 2 capsules (120 mg total) by mouth daily., Disp:  180 capsule, Rfl: 0   eletriptan  (RELPAX ) 40 MG tablet, Take 1 tablet at onset of migraine. May repeat in 2 hours if headache persists or recurs. Do not take more than 3 a week, Disp: 10 tablet, Rfl: 5   empagliflozin  (JARDIANCE ) 25 MG TABS tablet, Take 25 mg by mouth., Disp: , Rfl:    famotidine  (PEPCID ) 40 MG tablet, Take 1 tablet (40 mg total) by mouth 2 (two) times daily., Disp: 180 tablet, Rfl: 0   gabapentin  (NEURONTIN ) 600 MG tablet, Take 1 tablet (600 mg total) by mouth 3 (three) times daily., Disp: 90 tablet, Rfl: 2   GEMTESA 75 MG TABS, Take 1 tablet by mouth daily., Disp: , Rfl:    hydrOXYzine  (VISTARIL ) 50 MG capsule, Take 1 capsule (50 mg total) by mouth every 4 (four) hours as needed for anxiety., Disp: 180 capsule, Rfl: 2   ipratropium (ATROVENT ) 0.03 % nasal spray, Place 2 sprays into both nostrils every 12 (twelve) hours., Disp: 30 mL, Rfl: 0   JARDIANCE  25 MG TABS tablet, Take 1 tablet (25 mg total) by mouth every morning., Disp: 90 tablet, Rfl: 3   montelukast  (SINGULAIR ) 10 MG tablet, TAKE 1 TABLET BY MOUTH EVERYDAY AT BEDTIME, Disp: 90 tablet, Rfl: 0   naloxone  (NARCAN ) nasal spray 4 mg/0.1 mL, Place 1 spray into the nose as needed for up to 365 doses (for opioid-induced respiratory depresssion). In case of emergency (overdose), spray once into each nostril. If no response  within 3 minutes, repeat application and call 911., Disp: 1 each, Rfl: 1   ondansetron  (ZOFRAN -ODT) 4 MG disintegrating tablet, Take 2 tablets (8 mg total) by mouth every 8 (eight) hours as needed for nausea or vomiting., Disp: 30 tablet, Rfl: 0   pantoprazole  (PROTONIX ) 40 MG tablet, TAKE 1 TABLET BY MOUTH EVERY DAY IN THE MORNING, Disp: 90 tablet, Rfl: 1   potassium chloride  (MICRO-K ) 10 MEQ CR capsule, TAKE 1 CAPSULE BY MOUTH EVERY DAY, Disp: 90 capsule, Rfl: 3   QUEtiapine  (SEROQUEL ) 100 MG tablet, Take 100 mg by mouth 2 (two) times daily. Take 100 mg by mouth in AM and 200 mg by mouth in PM (Patient taking  differently: Take 100 mg by mouth at bedtime. Take 100 mg by mouth in AM and 200 mg by mouth in PM), Disp: , Rfl:    Semaglutide , 1 MG/DOSE, 4 MG/3ML SOPN, Inject 1 mg as directed once a week., Disp: 3 mL, Rfl: 1   SYMBICORT 160-4.5 MCG/ACT inhaler, Inhale 2 puffs into the lungs., Disp: , Rfl:    tiZANidine  (ZANAFLEX ) 4 MG tablet, Take 1 tablet (4 mg total) by mouth every 8 (eight) hours as needed., Disp: 270 tablet, Rfl: 3   traZODone  (DESYREL ) 150 MG tablet, TAKE 1 TABLET BY MOUTH AT BEDTIME., Disp: 90 tablet, Rfl: 3   zonisamide  (ZONEGRAN ) 100 MG capsule, Take 5 capsules every night, Disp: 150 capsule, Rfl: 11   Medications ordered in this encounter:  Meds ordered this encounter  Medications   predniSONE  (STERAPRED UNI-PAK 21 TAB) 10 MG (21) TBPK tablet    Sig: 6 day taper; take as directed on package instructions    Dispense:  21 tablet    Refill:  0    Supervising Provider:   BLAISE ALEENE KIDD [[id number]]     *If you need refills on other medications prior to your next appointment, please contact your pharmacy*  Follow-Up: Call back or seek an in-person evaluation if the symptoms worsen or if the condition fails to improve as anticipated.  [facility] [phone]  Other Instructions Migraine Headache A migraine headache is an intense pulsing or throbbing pain on one or both sides of the head. Migraine headaches may also cause other symptoms, such as nausea, vomiting, and sensitivity to light and noise. A migraine headache can last from 4 hours to 3 days. Talk with your health care provider about what things may bring on (trigger) your migraine headaches. What are the causes? The exact cause is not known. However, a migraine may be caused when nerves in the brain get irritated and release chemicals that cause blood vessels to become inflamed. This inflammation causes pain. Migraines may be triggered or caused by: Smoking. Medicines, such as: Nitroglycerin, which is  used to treat chest pain. Birth control pills. Estrogen. Certain blood pressure medicines. Foods or drinks that contain nitrates, glutamate, aspartame, MSG, or tyramine. Certain foods or drinks, such as aged cheeses, chocolate, alcohol , or caffeine. Doing physical activity that is very hard. Other triggers may include: Menstruation. Pregnancy. Hunger. Stress. Getting too much or too little sleep. Weather changes. Tiredness (fatigue). What increases the risk? The following factors may make you more likely to have migraine headaches: Being between the ages of 40-80 years old. Being female. Having a family history of migraine headaches. Being Caucasian. Having a mental health condition, such as depression or anxiety. Being obese. What are the signs or symptoms? The main symptom of this condition is pulsing or  throbbing pain. This pain may: Happen in any area of the head, such as on one or both sides. Make it hard to do daily activities. Get worse with physical activity. Get worse around bright lights, loud noises, or smells. Other symptoms may include: Nausea. Vomiting. Dizziness. Before a migraine headache starts, you may get warning signs (an aura). An aura may include: Seeing flashing lights or having blind spots. Seeing bright spots, halos, or zigzag lines. Having tunnel vision or blurred vision. Having numbness or a tingling feeling. Having trouble talking. Having muscle weakness. After a migraine ends, you may have symptoms. These may include: Feeling tired. Trouble concentrating. How is this diagnosed? A migraine headache can be diagnosed based on: Your symptoms. A physical exam. Tests, such as: A CT scan or an MRI of the head. These tests can help rule out other causes of headaches. Taking fluid from the spine (lumbar puncture) to examine it (cerebrospinal fluid analysis, or CSF analysis). How is this treated? This condition may be treated with medicines  that: Relieve pain and nausea. Prevent migraines. Treatment may also include: Acupuncture. Lifestyle changes like avoiding foods that trigger migraine headaches. Learning ways to control your body (biofeedback). Talk therapy to help you know and deal with negative thoughts (cognitive behavioral therapy). Follow these instructions at home: Medicines Take over-the-counter and prescription medicines only as told by your provider. Ask your provider if the medicine prescribed to you: Requires you to avoid driving or using machinery. Can cause constipation. You may need to take these actions to prevent or treat constipation: Drink enough fluid to keep your pee (urine) pale yellow. Take over-the-counter or prescription medicines. Eat foods that are high in fiber, such as beans, whole grains, and fresh fruits and vegetables. Limit foods that are high in fat and processed sugars, such as fried or sweet foods. Lifestyle  Do not drink alcohol . Do not use any products that contain nicotine or tobacco. These products include cigarettes, chewing tobacco, and vaping devices, such as e-cigarettes. If you need help quitting, ask your provider. Get 7-9 hours of sleep each night, or the amount recommended by your provider. Find ways to manage stress, such as meditation, deep breathing, or yoga. Try to exercise regularly. This can help lessen how bad and how often your migraines occur. General instructions Keep a journal to find out what triggers your migraines, so you can avoid those things. For example, write down: What you eat and drink. How much sleep you get. Any change to your diet or medicines. If you have a migraine headache: Avoid things that make your symptoms worse, such as bright lights. Lie down in a dark, quiet room. Do not drive or use machinery. Ask your provider what activities are safe for you while you have symptoms. Keep all follow-up visits. Your provider will monitor your  symptoms and recommend any further treatment. Where to find more information Coalition for Headache and Migraine Patients (CHAMP): headachemigraine.org American Migraine Foundation: americanmigrainefoundation.org National Headache Foundation: headaches.org Contact a health care provider if: You have symptoms that are different or worse than your usual migraine headache symptoms. You have more than 15 days of headaches in one month. Get help right away if: Your migraine headache becomes severe or lasts more than 72 hours. You have a fever or stiff neck. You have vision loss. Your muscles feel weak or like you cannot control them. You lose your balance often or have trouble walking. You faint. You have a seizure. This information is not  intended to replace advice given to you by your health care provider. Make sure you discuss any questions you have with your health care provider. Document Revised: 01/28/2022 Document Reviewed: 01/28/2022 Elsevier Patient Education  2024 [employer]vier Inc.   If you have been instructed to have an in-person evaluation today at a local Urgent Care facility, please use the link below. It will take you to a list of all of our available [facility], including address, phone number and hours of operation. Please do not delay care.  [facility]  If you or a family member do not have a primary care provider, use the link below to schedule a visit and establish care. When you choose a [facility] primary care physician or advanced practice provider, you gain a long-term partner in health. Find a Primary Care Provider  Learn more about [facility]'s in-office and virtual care options: [facility] - Get Care Now

## 2024-05-28 NOTE — Progress Notes (Signed)
 Virtual Visit Consent   Andrea [name], you are scheduled for a virtual visit with a [facility] provider today. Just as with appointments in the office, your consent must be obtained to participate. Your consent will be active for this visit and any virtual visit you may have with one of our providers in the next 365 days. If you have a MyChart account, a copy of this consent can be sent to you electronically.  As this is a virtual visit, video technology does not allow for your provider to perform a traditional examination. This may limit your provider's ability to fully assess your condition. If your provider identifies any concerns that need to be evaluated in person or the need to arrange testing (such as labs, EKG, etc.), we will make arrangements to do so. Although advances in technology are sophisticated, we cannot ensure that it will always work on either your end or our end. If the connection with a video visit is poor, the visit may have to be switched to a telephone visit. With either a video or telephone visit, we are not always able to ensure that we have a secure connection.  By engaging in this virtual visit, you consent to the provision of healthcare and authorize for your insurance to be billed (if applicable) for the services provided during this visit. Depending on your insurance coverage, you may receive a charge related to this service.  I need to obtain your verbal consent now. Are you willing to proceed with your visit today? Andrea [name] has provided verbal consent on 05/28/2024 for a virtual visit (video or telephone). Delon CHRISTELLA Dickinson, PA-C  Date: 05/28/2024 7:02 PM   Virtual Visit via Video Note   I, Delon CHRISTELLA Dickinson, connected with  Andrea [name]  ([id number], 22-Oct-1980, age 43) on 05/28/2024 at  6:30 PM EST by a video-enabled telemedicine application and verified that I am speaking with the correct person using two identifiers.  Location: Patient: Virtual  Visit Location Patient: Home Provider: Virtual Visit Location Provider: Home Office   I discussed the limitations of evaluation and management by telemedicine and the availability of in person appointments. The patient expressed understanding and agreed to proceed.    History of Present Illness: Andrea [name] is a 43 y.o. who identifies as a female who was assigned female at birth, and is being seen today for migraine.  HPI: Migraine  This is a new problem. The current episode started in the past 7 days (4 days, has been battling a sinus infection as well that may be triggering). The problem occurs constantly. The problem has been unchanged. The pain is located in the Frontal region. The pain radiates to the upper back, left neck and right neck. The pain quality is similar to prior headaches. The quality of the pain is described as aching, pulsating and throbbing. The pain is moderate. Associated symptoms include back pain (low back), insomnia, nausea, phonophobia, photophobia and sinus pressure. Pertinent negatives include no blurred vision, dizziness, ear pain, eye redness, facial sweating, fever, hearing loss, numbness, scalp tenderness, seizures, tingling, tinnitus, visual change or vomiting. The symptoms are aggravated by bright light. She has tried triptans and ergotamines (quelipta for prevention and eletriptan  for abortive treatment) for the symptoms. The treatment provided no relief. Her past medical history is significant for migraine headaches.     Problems:  Patient Active Problem List   Diagnosis Date Noted   Viral URI with cough 05/19/2024   Hematuria 05/03/2024  Osteopenia 04/29/2024   Urinary incontinence 03/11/2024   UTI symptoms 01/26/2024   Long term prescription benzodiazepine use 01/19/2024   Long-term current use of opiate analgesic 01/19/2024   Chronic prescription opiate use 01/19/2024   Closed ankle fracture, sequela (Right) 01/19/2024   Chronic postoperative  pain of ankle (Right) 01/19/2024   Chronic pain syndrome 01/18/2024   Pharmacologic therapy 01/18/2024   Disorder of skeletal system 01/18/2024   Problems influencing health status 01/18/2024   Delayed union of ankle fracture, closed (Right) 01/14/2024   Fall at home, sequela 01/14/2024   Neuropathic ulcer of heel, limited to breakdown of skin (HCC) (Right) 01/14/2024   Elevated blood pressure reading 12/26/2023   Schizoaffective disorder, bipolar type (HCC) 12/05/2023   Diabetic nephropathy associated with type 2 diabetes mellitus (HCC) 07/23/2023   Acute right-sided low back pain without sciatica 07/18/2023   Fluctuating blood pressure 07/18/2023   Hypotension 07/02/2023   Occasional tremors 07/02/2023   Constipation 07/02/2023   Syncope due to orthostatic hypotension 06/20/2023   Closed trimalleolar fracture of ankle, sequela (Right) 05/20/2023   Panic disorder 05/20/2023   History of removal of internal fixation device 05/20/2023   Primary localized osteoarthrosis of ankle and foot 05/20/2023   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) 05/20/2023   Acquired varus deformity of ankle (Right) 05/13/2023   Postoperative surgical complication involving musculoskeletal system associated with musculoskeletal procedure 05/12/2023   Type 2 diabetes mellitus with peripheral neuropathy (HCC) 05/03/2023   Mild persistent asthma 05/03/2023   Pre-syncope 05/03/2023   Chronic ankle pain (1ry area of Pain) (Right) 07/16/2022   Chronic post-traumatic stress disorder 03/13/2022   Generalized anxiety disorder with panic attacks 03/13/2022   Major depressive disorder, 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  Resting comfortably at home.  Head is normocephalic, atraumatic.  No labored breathing.  Speech is clear and coherent with logical content.  Patient is alert and oriented at baseline.    Assessment and Plan: 1. Other migraine with status migrainosus, intractable (Primary) - predniSONE  (STERAPRED UNI-PAK 21 TAB) 10 MG (21) TBPK tablet; 6 day taper; take as directed on package instructions  Dispense: 21 tablet; Refill: 0  - Typical Migraine lasting over 3 days (4 days total) and not responding to Relpax   - Will add Prednisone  6 day taper to break migraine cycle - Push fluids - Seek in person evaluation if not improving or if headache worsens  Follow Up Instructions: I discussed the assessment and treatment plan with the patient. The patient was provided an opportunity to ask questions and all were answered. The patient agreed with the plan and demonstrated an understanding of the instructions.  A copy of instructions were sent to the patient via MyChart unless otherwise noted below.    The patient was advised to call back or seek an in-person evaluation if the symptoms worsen or if the condition fails to improve as anticipated.    Delon CHRISTELLA Dickinson, PA-C     [1]  Allergies Allergen Reactions   Bactrim [Sulfamethoxazole -Trimethoprim] Hives and  Itching   Codeine Hives and Itching   Sulfa Antibiotics Itching  [2]  Current Outpatient Medications:    predniSONE  (STERAPRED UNI-PAK 21 TAB) 10 MG (21) TBPK tablet, 6 day taper; take as directed on package instructions, Disp: 21 tablet, Rfl: 0   Albuterol -Budesonide  (AIRSUPRA) 90-80 MCG/ACT AERO, , Disp: , Rfl:    amoxicillin -clavulanate (AUGMENTIN ) 875-125 MG tablet, Take 1 tablet by  mouth 2 (two) times daily., Disp: 14 tablet, Rfl: 0   Atogepant  (QULIPTA ) 30 MG TABS, Take 1 tablet daily, Disp: 30 tablet, Rfl: 11   buPROPion  (WELLBUTRIN  XL) 150 MG 24 hr tablet, Take 1 tablet (150 mg total) by mouth daily., Disp: 30 tablet, Rfl: 2   busPIRone  (BUSPAR ) 10 MG tablet, Take 1 tablet (10 mg total) by mouth 3 (three) times daily., Disp: 90 tablet, Rfl: 2   celecoxib  (CELEBREX ) 200 MG capsule, Take 1 capsule (200 mg total) by mouth 2 (two) times daily., Disp: 180 capsule, Rfl: 3   cetirizine  (ZYRTEC ) 10 MG tablet, Take 10 mg by mouth at bedtime., Disp: , Rfl:    clonazePAM  (KLONOPIN ) 0.5 MG tablet, Take 1 tablet (0.5 mg total) by mouth 2 (two) times daily as needed for anxiety., Disp: 60 tablet, Rfl: 2   Continuous Glucose Sensor (FREESTYLE LIBRE 3 PLUS SENSOR) MISC, Change sensor every 15 days., Disp: 2 each, Rfl: 11   DULoxetine  (CYMBALTA ) 60 MG capsule, Take 2 capsules (120 mg total) by mouth daily., Disp: 180 capsule, Rfl: 0   eletriptan  (RELPAX ) 40 MG tablet, Take 1 tablet at onset of migraine. May repeat in 2 hours if headache persists or recurs. Do not take more than 3 a week, Disp: 10 tablet, Rfl: 5   empagliflozin  (JARDIANCE ) 25 MG TABS tablet, Take 25 mg by mouth., Disp: , Rfl:    famotidine  (PEPCID ) 40 MG tablet, Take 1 tablet (40 mg total) by mouth 2 (two) times daily., Disp: 180 tablet, Rfl: 0   gabapentin  (NEURONTIN ) 600 MG tablet, Take 1 tablet (600 mg total) by mouth 3 (three) times daily., Disp: 90 tablet, Rfl: 2   GEMTESA 75 MG TABS, Take 1 tablet by mouth daily., Disp: , Rfl:    hydrOXYzine  (VISTARIL ) 50 MG capsule, Take 1 capsule (50 mg total) by mouth every 4 (four) hours as needed for anxiety., Disp: 180 capsule, Rfl: 2   ipratropium (ATROVENT ) 0.03 % nasal spray, Place 2 sprays into both nostrils every 12 (twelve) hours., Disp: 30 mL, Rfl: 0   JARDIANCE  25 MG TABS tablet, Take 1 tablet (25 mg total) by mouth every morning., Disp: 90 tablet, Rfl: 3   montelukast   (SINGULAIR ) 10 MG tablet, TAKE 1 TABLET BY MOUTH EVERYDAY AT BEDTIME, Disp: 90 tablet, Rfl: 0   naloxone  (NARCAN ) nasal spray 4 mg/0.1 mL, Place 1 spray into the nose as needed for up to 365 doses (for opioid-induced respiratory depresssion). In case of emergency (overdose), spray once into each nostril. If no response within 3 minutes, repeat application and call 911., Disp: 1 each, Rfl: 1   ondansetron  (ZOFRAN -ODT) 4 MG disintegrating tablet, Take 2 tablets (8 mg total) by mouth every 8 (eight) hours as needed for nausea or vomiting., Disp: 30 tablet, Rfl: 0   pantoprazole  (PROTONIX ) 40 MG tablet, TAKE 1 TABLET BY MOUTH EVERY DAY IN THE MORNING, Disp: 90 tablet, Rfl: 1   potassium chloride  (MICRO-K ) 10 MEQ CR capsule, TAKE 1 CAPSULE BY MOUTH EVERY DAY, Disp: 90 capsule, Rfl: 3   QUEtiapine  (SEROQUEL ) 100 MG tablet,  Take 100 mg by mouth 2 (two) times daily. Take 100 mg by mouth in AM and 200 mg by mouth in PM (Patient taking differently: Take 100 mg by mouth at bedtime. Take 100 mg by mouth in AM and 200 mg by mouth in PM), Disp: , Rfl:    Semaglutide , 1 MG/DOSE, 4 MG/3ML SOPN, Inject 1 mg as directed once a week., Disp: 3 mL, Rfl: 1   SYMBICORT 160-4.5 MCG/ACT inhaler, Inhale 2 puffs into the lungs., Disp: , Rfl:    tiZANidine  (ZANAFLEX ) 4 MG tablet, Take 1 tablet (4 mg total) by mouth every 8 (eight) hours as needed., Disp: 270 tablet, Rfl: 3   traZODone  (DESYREL ) 150 MG tablet, TAKE 1 TABLET BY MOUTH AT BEDTIME., Disp: 90 tablet, Rfl: 3   zonisamide  (ZONEGRAN ) 100 MG capsule, Take 5 capsules every night, Disp: 150 capsule, Rfl: 11

## 2024-05-31 ENCOUNTER — Encounter: Payer: Self-pay | Admitting: Nurse Practitioner

## 2024-06-01 ENCOUNTER — Ambulatory Visit: Payer: Self-pay

## 2024-06-01 ENCOUNTER — Encounter: Payer: Self-pay | Admitting: Nurse Practitioner

## 2024-06-01 NOTE — Telephone Encounter (Signed)
 FYI Only or Action Required?: FYI only for provider: appointment scheduled on 06/02/2024.  Patient was last seen in primary care on 05/28/2024 by Andrea Delon HERO, PA-C.  Called Nurse Triage reporting Abdominal Pain.  Symptoms began couple weeks ago.  Interventions attempted: OTC medications: pepto.  Symptoms are: unchanged.  Triage Disposition: See Physician Within 24 Hours  Patient/caregiver understands and will follow disposition?: Yes   Copied from CRM #[id number]. Topic: Clinical - Red Word Triage >> Jun 01, 2024  2:11 PM Andrea [name] wrote: Red Word that prompted transfer to Nurse Triage: Stomach pain every time she eats and feels nauseous Reason for Disposition  [1] MODERATE pain (e.g., interferes with normal activities) AND [2] pain comes and goes (cramps) AND [3] present > 24 hours  (Exception: Pain with Vomiting or Diarrhea - see that Guideline.)  Answer Assessment - Initial Assessment Questions 1. LOCATION: Where does it hurt?      Low abd all across last about 1 hour after eating 2. RADIATION: Does the pain shoot anywhere else? (e.g., chest, back)     no 3. ONSET: When did the pain begin? (e.g., minutes, hours or days ago)      2 weeks  4. SUDDEN: Gradual or sudden onset?     sudden 5. PATTERN Does the pain come and go, or is it constant?     Comes and goes 6. SEVERITY: How bad is the pain?  (e.g., Scale 1-10; mild, moderate, or severe)     8/10 7. RECURRENT SYMPTOM: Have you ever had this type of stomach pain before? If Yes, ask: When was the last time? and What happened that time?      no 8. CAUSE: What do you think is causing the stomach pain? (e.g., gallstones, recent abdominal surgery)     na 9. RELIEVING/AGGRAVATING FACTORS: What makes it better or worse? (e.g., antacids, bending or twisting motion, bowel movement)     no 10. OTHER SYMPTOMS: Do you have any other symptoms? (e.g., back pain, diarrhea, fever, urination pain, vomiting)        nausea 11. PREGNANCY: Is there any chance you are pregnant? When was your last menstrual period?       No  Abd pain makes pt feel really nauseous to the point needs to lay down. Nausea lasts longer than hour: has taken zofran  but does not help.  Has taken pepto does not help.  Feeling comes on as soon as pt eats even with bland foods ex. Mash potatoes  Protocols used: Abdominal Pain - Female-A-AH

## 2024-06-02 ENCOUNTER — Ambulatory Visit: Payer: MEDICAID | Admitting: Nurse Practitioner

## 2024-06-02 ENCOUNTER — Encounter: Payer: Self-pay | Admitting: Nurse Practitioner

## 2024-06-02 VITALS — BP 122/81 | HR 69 | Temp 97.6°F | Ht 71.0 in | Wt 274.8 lb

## 2024-06-02 DIAGNOSIS — G4709 Other insomnia: Secondary | ICD-10-CM

## 2024-06-02 DIAGNOSIS — F411 Generalized anxiety disorder: Secondary | ICD-10-CM | POA: Diagnosis not present

## 2024-06-02 DIAGNOSIS — F250 Schizoaffective disorder, bipolar type: Secondary | ICD-10-CM | POA: Diagnosis not present

## 2024-06-02 DIAGNOSIS — R110 Nausea: Secondary | ICD-10-CM | POA: Insufficient documentation

## 2024-06-02 DIAGNOSIS — F410 Panic disorder [episodic paroxysmal anxiety] without agoraphobia: Secondary | ICD-10-CM

## 2024-06-02 MED ORDER — CLONAZEPAM 0.5 MG PO TABS: 0.5000 mg | ORAL_TABLET | Freq: Three times a day (TID) | ORAL | 2 refills | Status: AC | PRN

## 2024-06-02 MED ORDER — PROMETHAZINE HCL 25 MG PO TABS: 25.0000 mg | ORAL_TABLET | Freq: Three times a day (TID) | ORAL | 0 refills | Status: DC | PRN

## 2024-06-02 NOTE — Assessment & Plan Note (Addendum)
 She has difficulty falling asleep, likely due to anxiety. Increase Klonopin  to three times daily, using one tablet at bedtime as needed for sleep. Advise follow-up with psychiatry for further management of anxiety and sleep issues.

## 2024-06-02 NOTE — Assessment & Plan Note (Signed)
 Abdominal pain and nausea occur postprandially for two weeks, likely due to medication side effects and anxiety. Gallbladder issues are ruled out, and a viral cause is unlikely. Zofran  is ineffective. Prescribe Phenergan  for nausea. Advise dietary modifications to include bland, easily digestible foods and avoid greasy and fried foods. Encourage small, frequent meals. Instruct her to monitor for worsening symptoms such as fever, vomiting, or severe pain. Consider decreasing Ozempic  back to 0.5 mg if symptoms persist.

## 2024-06-02 NOTE — Assessment & Plan Note (Signed)
 She has difficulty falling asleep, likely due to anxiety. Current medications include trazodone , Seroquel , and Klonopin  with inconsistent efficacy. Increase Klonopin  to three times daily, using one tablet at bedtime as needed for sleep. Advise follow-up with psychiatry for further management of anxiety and sleep issues. PDMP reviewed.

## 2024-06-02 NOTE — Progress Notes (Signed)
 Leron Glance, NP-C Phone: [phone]  Andrea [name] is a 43 y.o. female who presents today for abdominal pain.   Discussed the use of AI scribe software for clinical note transcription with the patient, who gave verbal consent to proceed.  History of Present Illness   Andrea [name] is a 43 year old female who presents with abdominal pain and nausea after eating.  She has been experiencing crampy abdominal pain and nausea primarily after eating for the past two weeks. The pain is located in both the upper and lower abdomen, without a specific focal point, and occurs every time she eats, regardless of the type of food, including bland foods like mashed potatoes and green bean casserole. The nausea is severe enough to require her to lie down after eating. No vomiting, but she feels as though she could vomit. She also reports being 'kind of burpy' and has been taking Protonix  for acid reflux. No recent fevers, constipation, or significant diarrhea, although she did experience diarrhea yesterday.  She is currently taking several medications, including Protonix , Klonopin , and Ozempic . She reports a recent increase in her Ozempic  dosage from 0.5 to 1 mg, as well as some recent changes in her psychiatric medications. She also recently completed a course of Augmentin .  In addition to her gastrointestinal symptoms, she reports having difficulty sleeping. She is currently taking trazodone  and Seroquel  for sleep, but finds them only sometimes effective. She describes her sleep issues as primarily related to difficulty falling asleep due to a racing mind, which she attributes to anxiety. She also takes Klonopin  twice a day, including at bedtime, to help with sleep.  No fever, vomiting, significant diarrhea, constipation, or blood in stool. She reports nausea, crampy abdominal pain, and burping.      Tobacco Use History[1]  Medications Ordered Prior to Encounter[2]   ROS see history of present  illness  Objective  Physical Exam Vitals:   06/02/24 1005  BP: 122/81  Pulse: 69  Temp: 97.6 F (36.4 C)  SpO2: 96%    BP Readings from Last 3 Encounters:  06/02/24 122/81  05/19/24 131/87  05/06/24 (!) 140/80   Wt Readings from Last 3 Encounters:  06/02/24 274 lb 12.8 oz (124.6 kg)  05/19/24 273 lb 12.8 oz (124.2 kg)  05/03/24 270 lb (122.5 kg)    Physical Exam Constitutional:      General: She is not in acute distress.    Appearance: Normal appearance.  HENT:     Head: Normocephalic.  Cardiovascular:     Rate and Rhythm: Normal rate and regular rhythm.     Heart sounds: Normal heart sounds.  Pulmonary:     Effort: Pulmonary effort is normal.     Breath sounds: Normal breath sounds.  Abdominal:     General: Abdomen is flat. Bowel sounds are normal.     Palpations: Abdomen is soft.     Tenderness: There is generalized abdominal tenderness.  Skin:    General: Skin is warm and dry.  Neurological:     General: No focal deficit present.     Mental Status: She is alert.  Psychiatric:        Mood and Affect: Mood normal.        Behavior: Behavior normal.      Assessment/Plan: Please see individual problem list.  Postprandial nausea Assessment & Plan: Abdominal pain and nausea occur postprandially for two weeks, likely due to medication side effects and anxiety. Gallbladder issues are ruled out, and a  viral cause is unlikely. Zofran  is ineffective. Prescribe Phenergan  for nausea. Advise dietary modifications to include bland, easily digestible foods and avoid greasy and fried foods. Encourage small, frequent meals. Instruct her to monitor for worsening symptoms such as fever, vomiting, or severe pain. Consider decreasing Ozempic  back to 0.5 mg if symptoms persist.   Orders: -     Promethazine  HCl; Take 1 tablet (25 mg total) by mouth every 8 (eight) hours as needed for nausea or vomiting.  Dispense: 30 tablet; Refill: 0  Other insomnia Assessment & Plan: She  has difficulty falling asleep, likely due to anxiety. Current medications include trazodone , Seroquel , and Klonopin  with inconsistent efficacy. Increase Klonopin  to three times daily, using one tablet at bedtime as needed for sleep. Advise follow-up with psychiatry for further management of anxiety and sleep issues. PDMP reviewed.  Orders: -     clonazePAM ; Take 1 tablet (0.5 mg total) by mouth 3 (three) times daily as needed for anxiety (sleep).  Dispense: 90 tablet; Refill: 2  Generalized anxiety disorder with panic attacks Assessment & Plan: She has difficulty falling asleep, likely due to anxiety. Increase Klonopin  to three times daily, using one tablet at bedtime as needed for sleep. Advise follow-up with psychiatry for further management of anxiety and sleep issues.   Orders: -     clonazePAM ; Take 1 tablet (0.5 mg total) by mouth 3 (three) times daily as needed for anxiety (sleep).  Dispense: 90 tablet; Refill: 2  Schizoaffective disorder, bipolar type (HCC) -     clonazePAM ; Take 1 tablet (0.5 mg total) by mouth 3 (three) times daily as needed for anxiety (sleep).  Dispense: 90 tablet; Refill: 2     Return if symptoms worsen or fail to improve.   Leron Glance, NP-C [facility] [place]     [1]  Social History Tobacco Use  Smoking Status Never  Smokeless Tobacco Never  [2]  Current Outpatient Medications on File Prior to Visit  Medication Sig Dispense Refill   Albuterol -Budesonide  (AIRSUPRA) 90-80 MCG/ACT AERO      Atogepant  (QULIPTA ) 30 MG TABS Take 1 tablet daily 30 tablet 11   buPROPion  (WELLBUTRIN  XL) 150 MG 24 hr tablet Take 1 tablet (150 mg total) by mouth daily. 30 tablet 2   busPIRone  (BUSPAR ) 10 MG tablet Take 1 tablet (10 mg total) by mouth 3 (three) times daily. 90 tablet 2   celecoxib  (CELEBREX ) 200 MG capsule Take 1 capsule (200 mg total) by mouth 2 (two) times daily. 180 capsule 3   cetirizine  (ZYRTEC ) 10 MG tablet Take 10 mg by mouth at  bedtime.     Continuous Glucose Sensor (FREESTYLE LIBRE 3 PLUS SENSOR) MISC Change sensor every 15 days. 2 each 11   DULoxetine  (CYMBALTA ) 60 MG capsule Take 2 capsules (120 mg total) by mouth daily. 180 capsule 0   eletriptan  (RELPAX ) 40 MG tablet Take 1 tablet at onset of migraine. May repeat in 2 hours if headache persists or recurs. Do not take more than 3 a week 10 tablet 5   empagliflozin  (JARDIANCE ) 25 MG TABS tablet Take 25 mg by mouth.     famotidine  (PEPCID ) 40 MG tablet Take 1 tablet (40 mg total) by mouth 2 (two) times daily. 180 tablet 0   gabapentin  (NEURONTIN ) 600 MG tablet Take 1 tablet (600 mg total) by mouth 3 (three) times daily. 90 tablet 2   hydrOXYzine  (VISTARIL ) 50 MG capsule Take 1 capsule (50 mg total) by mouth every 4 (  four) hours as needed for anxiety. 180 capsule 2   mirabegron  ER (MYRBETRIQ ) 25 MG TB24 tablet Take 25 mg by mouth daily.     montelukast  (SINGULAIR ) 10 MG tablet TAKE 1 TABLET BY MOUTH EVERYDAY AT BEDTIME 90 tablet 0   naloxone  (NARCAN ) nasal spray 4 mg/0.1 mL Place 1 spray into the nose as needed for up to 365 doses (for opioid-induced respiratory depresssion). In case of emergency (overdose), spray once into each nostril. If no response within 3 minutes, repeat application and call 911. 1 each 1   ondansetron  (ZOFRAN -ODT) 4 MG disintegrating tablet Take 2 tablets (8 mg total) by mouth every 8 (eight) hours as needed for nausea or vomiting. 30 tablet 0   pantoprazole  (PROTONIX ) 40 MG tablet TAKE 1 TABLET BY MOUTH EVERY DAY IN THE MORNING 90 tablet 1   potassium chloride  (MICRO-K ) 10 MEQ CR capsule TAKE 1 CAPSULE BY MOUTH EVERY DAY 90 capsule 3   QUEtiapine  (SEROQUEL ) 100 MG tablet Take 100 mg by mouth 2 (two) times daily. Take 100 mg by mouth in AM and 200 mg by mouth in PM (Patient taking differently: Take 100 mg by mouth at bedtime. Take 100 mg by mouth in AM and 200 mg by mouth in PM)     Semaglutide , 1 MG/DOSE, 4 MG/3ML SOPN Inject 1 mg as directed once a  week. 3 mL 1   SYMBICORT 160-4.5 MCG/ACT inhaler Inhale 2 puffs into the lungs.     tiZANidine  (ZANAFLEX ) 4 MG tablet Take 1 tablet (4 mg total) by mouth every 8 (eight) hours as needed. 270 tablet 3   traZODone  (DESYREL ) 150 MG tablet TAKE 1 TABLET BY MOUTH AT BEDTIME. 90 tablet 3   zonisamide  (ZONEGRAN ) 100 MG capsule Take 5 capsules every night 150 capsule 11   No current facility-administered medications on file prior to visit.

## 2024-06-04 ENCOUNTER — Telehealth: Payer: MEDICAID | Admitting: Pain Medicine

## 2024-06-07 ENCOUNTER — Ambulatory Visit: Payer: MEDICAID | Attending: Pain Medicine | Admitting: Pain Medicine

## 2024-06-07 ENCOUNTER — Encounter: Payer: Self-pay | Admitting: Pain Medicine

## 2024-06-07 ENCOUNTER — Encounter: Payer: Self-pay | Admitting: Neurology

## 2024-06-07 VITALS — BP 127/91 | HR 86 | Temp 97.2°F | Resp 16 | Ht 71.0 in | Wt 274.0 lb

## 2024-06-07 DIAGNOSIS — Z981 Arthrodesis status: Secondary | ICD-10-CM | POA: Insufficient documentation

## 2024-06-07 DIAGNOSIS — M25571 Pain in right ankle and joints of right foot: Secondary | ICD-10-CM | POA: Diagnosis present

## 2024-06-07 DIAGNOSIS — S82891S Other fracture of right lower leg, sequela: Secondary | ICD-10-CM | POA: Insufficient documentation

## 2024-06-07 DIAGNOSIS — S82851S Displaced trimalleolar fracture of right lower leg, sequela: Secondary | ICD-10-CM | POA: Diagnosis present

## 2024-06-07 DIAGNOSIS — G8928 Other chronic postprocedural pain: Secondary | ICD-10-CM | POA: Diagnosis present

## 2024-06-07 DIAGNOSIS — G8929 Other chronic pain: Secondary | ICD-10-CM | POA: Insufficient documentation

## 2024-06-07 NOTE — Progress Notes (Signed)
 Safety precautions to be maintained throughout the outpatient stay will include: orient to surroundings, keep bed in low position, maintain call bell within reach at all times, provide assistance with transfer out of bed and ambulation.

## 2024-06-07 NOTE — Patient Instructions (Signed)
 " ______________________________________________________________________    Procedure instructions  Stop blood-thinners  Do not eat or drink fluids (other than water ) for 8 hours before your procedure  No water  for 2 hours before your procedure  Take your blood pressure medicine with a sip of water   Arrive 30 minutes before your appointment  If sedation is planned, bring suitable driver. [place], [place], & public transportation are NOT APPROVED)  Carefully read the Preparing for your procedure detailed instructions  If you have questions call us  at [phone]  Procedure appointments are for procedures only.   NO medication refills or new problem evaluations will be done on procedure days.   Only the scheduled, pre-approved procedure and side will be done.   ______________________________________________________________________     ______________________________________________________________________    Preparing for your procedure  Appointments: If you think you may not be able to keep your appointment, call 24-48 hours in advance to cancel. We need time to make it available to others.  Procedure visits are for procedures only. During your procedure appointment there will be: NO Prescription Refills*. NO medication changes or discussions*. NO discussion of disability issues*. NO unrelated pain problem evaluations*. NO evaluations to order other pain procedures*. *These will be addressed at a separate and distinct evaluation encounter on the provider's evaluation schedule and not during procedure days.  Instructions: Food intake: Avoid eating anything solid for at least 8 hours prior to your procedure. Clear liquid intake: You may take clear liquids such as water  up to 2 hours prior to your procedure. (No carbonated drinks. No soda.) Transportation: Unless otherwise stated by your physician, bring a driver. (Driver cannot be a [employer]earcher, Pharmacist, Community, or any other form of public  transportation.) Morning Medicines: Except for blood thinners, take all of your other morning medications with a sip of water . Make sure to take your heart and blood pressure medicines. If your blood pressure's lower number is above 100, the case will be rescheduled. Blood thinners: Make sure to stop your blood thinners as instructed.  If you take a blood thinner, but were not instructed to stop it, call our office [phone] and ask to talk to a nurse. Not stopping a blood thinner prior to certain procedures could lead to serious complications. Diabetics on insulin: Notify the staff so that you can be scheduled 1st case in the morning. If your diabetes requires high dose insulin, take only  of your normal insulin dose the morning of the procedure and notify the staff that you have done so. Preventing infections: Shower with an antibacterial soap the morning of your procedure.  Build-up your immune system: Take 1000 mg of Vitamin C with every meal (3 times a day) the day prior to your procedure. Antibiotics: Inform the nursing staff if you are taking any antibiotics or if you have any conditions that may require antibiotics prior to procedures. (Example: recent joint implants)   Pregnancy: If you are pregnant make sure to notify the nursing staff. Not doing so may result in injury to the fetus, including death.  Sickness: If you have a cold, fever, or any active infections, call and cancel or reschedule your procedure. Receiving steroids while having an infection may result in complications. Arrival: You must be in the facility at least 30 minutes prior to your scheduled procedure. Tardiness: Your scheduled time is also the cutoff time. If you do not arrive at least 15 minutes prior to your procedure, you will be rescheduled.  Children: Do not bring any children  with you. Make arrangements to keep them home. Dress appropriately: There is always a possibility that your clothing may get soiled. Avoid  long dresses. Valuables: Do not bring any jewelry or valuables.  Reasons to call and reschedule or cancel your procedure: (Following these recommendations will minimize the risk of a serious complication.) Surgeries: Avoid having procedures within 2 weeks of any surgery. (Avoid for 2 weeks before or after any surgery). Flu Shots: Avoid having procedures within 2 weeks of a flu shots or . (Avoid for 2 weeks before or after immunizations). Barium: Avoid having a procedure within 7-10 days after having had a radiological study involving the use of radiological contrast. (Myelograms, Barium swallow or enema study). Heart attacks: Avoid any elective procedures or surgeries for the initial 6 months after a Myocardial Infarction (Heart Attack). Blood thinners: It is imperative that you stop these medications before procedures. Let us  know if you if you take any blood thinner.  Infection: Avoid procedures during or within two weeks of an infection (including chest colds or gastrointestinal problems). Symptoms associated with infections include: Localized redness, fever, chills, night sweats or profuse sweating, burning sensation when voiding, cough, congestion, stuffiness, runny nose, sore throat, diarrhea, nausea, vomiting, cold or Flu symptoms, recent or current infections. It is specially important if the infection is over the area that we intend to treat. Heart and lung problems: Symptoms that may suggest an active cardiopulmonary problem include: cough, chest pain, breathing difficulties or shortness of breath, dizziness, ankle swelling, uncontrolled high or unusually low blood pressure, and/or palpitations. If you are experiencing any of these symptoms, cancel your procedure and contact your primary care physician for an evaluation.  Remember:  Regular Business hours are:  Monday to Thursday 8:00 AM to 4:00 PM  Provider's Schedule: Eric Como, MD:  Procedure days: Tuesday and Thursday 7:30  AM to 4:00 PM  Wallie Sherry, MD:  Procedure days: Monday and Wednesday 7:30 AM to 4:00 PM Last  Updated: 05/27/2023 ______________________________________________________________________     ______________________________________________________________________    General Risks and Possible Complications  Patient Responsibilities: It is important that you read this as it is part of your informed consent. It is our duty to inform you of the risks and possible complications associated with treatments offered to you. It is your responsibility as a patient to read this and to ask questions about anything that is not clear or that you believe was not covered in this document.  Patients Rights: You have the right to refuse treatment. You also have the right to change your mind, even after initially having agreed to have the treatment done. However, under this last option, if you wait until the last second to change your mind, you may be charged for the materials used up to that point.  Introduction: Medicine is not an visual merchandiser. Everything in Medicine, including the lack of treatment(s), carries the potential for danger, harm, or loss (which is by definition: Risk). In Medicine, a complication is a secondary problem, condition, or disease that can aggravate an already existing one. All treatments carry the risk of possible complications. The fact that a side effects or complications occurs, does not imply that the treatment was conducted incorrectly. It must be clearly understood that these can happen even when everything is done following the highest safety standards.  No treatment: You can choose not to proceed with the proposed treatment alternative. The PRO(s) would include: avoiding the risk of complications associated with the therapy. The CON(s) would  include: not getting any of the treatment benefits. These benefits fall under one of three categories: diagnostic; therapeutic; and/or  palliative. Diagnostic benefits include: getting information which can ultimately lead to improvement of the disease or symptom(s). Therapeutic benefits are those associated with the successful treatment of the disease. Finally, palliative benefits are those related to the decrease of the primary symptoms, without necessarily curing the condition (example: decreasing the pain from a flare-up of a chronic condition, such as incurable terminal cancer).  General Risks and Complications: These are associated to most interventional treatments. They can occur alone, or in combination. They fall under one of the following six (6) categories: no benefit or worsening of symptoms; bleeding; infection; nerve damage; allergic reactions; and/or death. No benefits or worsening of symptoms: In Medicine there are no guarantees, only probabilities. No healthcare provider can ever guarantee that a medical treatment will work, they can only state the probability that it may. Furthermore, there is always the possibility that the condition may worsen, either directly, or indirectly, as a consequence of the treatment. Bleeding: This is more common if the patient is taking a blood thinner, either prescription or over the counter (example: Goody Powders, Fish oil, Aspirin , Garlic, etc.), or if suffering a condition associated with impaired coagulation (example: Hemophilia, cirrhosis of the liver, low platelet counts, etc.). However, even if you do not have one on these, it can still happen. If you have any of these conditions, or take one of these drugs, make sure to notify your treating physician. Infection: This is more common in patients with a compromised immune system, either due to disease (example: diabetes, cancer, human immunodeficiency virus [HIV], etc.), or due to medications or treatments (example: therapies used to treat cancer and rheumatological diseases). However, even if you do not have one on these, it can still  happen. If you have any of these conditions, or take one of these drugs, make sure to notify your treating physician. Nerve Damage: This is more common when the treatment is an invasive one, but it can also happen with the use of medications, such as those used in the treatment of cancer. The damage can occur to small secondary nerves, or to large primary ones, such as those in the spinal cord and brain. This damage may be temporary or permanent and it may lead to impairments that can range from temporary numbness to permanent paralysis and/or brain death. Allergic Reactions: Any time a substance or material comes in contact with our body, there is the possibility of an allergic reaction. These can range from a mild skin rash (contact dermatitis) to a severe systemic reaction (anaphylactic reaction), which can result in death. Death: In general, any medical intervention can result in death, most of the time due to an unforeseen complication. ______________________________________________________________________      ______________________________________________________________________    Steroid injections  Common steroids for injections Triamcinolone : Used by many sports medicine physicians for large joint and bursal injections, often combined with a local anesthetic like lidocaine . A study focusing on coccydynia (tailbone pain) found triamcinolone  was more effective than betamethasone, suggesting it may also be preferable for other localized inflammation conditions. Methylprednisolone: A common alternative to triamcinolone  that is also a strong anti-inflammatory. It is available in different formulations, with the acetate suspension being the long-acting option for intra-articular injections. Dexamethasone : This is a non-particulate steroid, meaning it has a lower risk of tissue damage compared to particulate steroids like triamcinolone  and methylprednisolone. While less common for this specific  use, it is an option for targeted injections.   Considerations for physicians Particulate vs. non-particulate steroids: Triamcinolone  and methylprednisolone are particulate, meaning they can clump together. Dexamethasone  is non-particulate. Particulate steroids are often preferred for their longer-lasting effects but carry a theoretical higher risk for certain injections (though this is less of a concern in the costochondral joints). Combined injectate: Corticosteroids are typically mixed with a local anesthetic like lidocaine  to provide both immediate pain relief (from the anesthetic) and longer-term inflammation reduction (from the steroid). Imaging guidance: To ensure accurate placement of the needle and medication, physicians may use ultrasound or fluoroscopic guidance for the injection, especially in complex or refractory cases.   Patient guidance Before undergoing a steroid injection, discuss the options with your physician. They will determine the best steroid, dosage, and procedure for your specific case based on factors like: Severity of your condition History of response to other treatments Your overall health status Experience and preference of the physician  Last  Updated: 02/10/2024 ______________________________________________________________________     "

## 2024-06-07 NOTE — Progress Notes (Signed)
 PROVIDER NOTE: Interpretation of information contained herein should be left to medically-trained personnel. Specific patient instructions are provided elsewhere under Patient Instructions section of medical record. This document was created in part using AI and STT-dictation technology, any transcriptional errors that may result from this process are unintentional.  Patient: Andrea [name]  Service: E/M   PCP: Gretel App, NP  DOB: 07/07/1980  DOS: 06/07/2024  Provider: Eric DELENA Como, MD  MRN: [id number]  Delivery: Face-to-face  Specialty: Interventional Pain Management  Type: Established Patient  Setting: Ambulatory outpatient facility  Specialty designation: 09  Referring Prov.: Gretel App, NP  Location: Outpatient office facility       History of present illness (HPI) Andrea [name], a 43 y.o. year old female, is here today because of her Chronic pain of right ankle [M25.571, G89.29]. Andrea [name]'s primary complain today is Ankle Pain (Right )  Pertinent problems: Ms. Skog has Migraines; Chronic ankle pain (1ry area of Pain) (Right); Type 2 diabetes mellitus with peripheral neuropathy (HCC); Postoperative surgical complication involving musculoskeletal system associated with musculoskeletal procedure; Acquired varus deformity of ankle (Right); Acute right-sided low back pain without sciatica; Closed trimalleolar fracture of ankle, sequela (Right); Delayed union of ankle fracture, closed (Right); Chronic pain syndrome; Closed ankle fracture, sequela (Right); Chronic postoperative pain of ankle (Right); History of removal of internal fixation device; Primary localized osteoarthrosis of ankle and foot; 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  Pain Assessment: Severity of  Chronic pain is reported as a 9 /10. Location: Ankle Right/Radaites from right ankle up towards the right knee and radiates down to entire right foot. Onset: More than a month ago. Quality: Throbbing. Timing: Constant. Modifying factor(s): Ice and Tylenol . Vitals:  height is 5' 11 (1.803 m) and weight is 274 lb (124.3 kg). Her temporal temperature is 97.2 F (36.2 C) (abnormal). Her blood pressure is 127/91 (abnormal) and her pulse is 86. Her respiration is 16 and oxygen saturation is 98%.  BMI: Estimated body mass index is 38.22 kg/m as calculated from the following:   Height as of this encounter: 5' 11 (1.803 m).   Weight as of this encounter: 274 lb (124.3 kg).  Last encounter: 04/12/2024. Last procedure: Visit date not found.  Reason for encounter: evaluation of worsening, or previously known (established) problem.  The patient has a history of right ankle pain initially evaluated on 01/19/2024.  Patient indicated having had surgery on 02/27/2024. (POD: 101).  Apparently she continues to have pain in the same right ankle.  Discussed the use of AI scribe software for clinical note transcription with the patient, who gave verbal consent to proceed.  History of Present Illness   Andrea [name] is a 43 year old female who presents with post-surgical pain in her right ankle.  She returns after right ankle surgery with external fixator placement on September 12 and removal on October 28. She only recently began weight-bearing, which causes severe right ankle pain described as very intense.  Her right ankle joint is fused with two screws in an X configuration. She has not been prescribed NSAIDs or steroids for swelling or pain.  She has diabetes that is well controlled with a recent A1c of 6.2.      Pharmacotherapy Assessment   Opioid Analgesic: Oxycodone /APAP 5/325, 1 tab p.o. every  4 hours (# 30) (last filled on 04/13/2024) (According to PMP, patient has been on some form of opioid  analgesics since before September 2023.) MME/day: 32.14-90 mg/day   Monitoring: [place] PMP: PDMP reviewed during this encounter.       Pharmacotherapy: No side-effects or adverse reactions reported. Compliance: No problems identified. Effectiveness: Clinically acceptable.  Bonner Norris, RN  06/07/2024 12:59 PM  Sign when Signing Visit Safety precautions to be maintained throughout the outpatient stay will include: orient to surroundings, keep bed in low position, maintain call bell within reach at all times, provide assistance with transfer out of bed and ambulation.     UDS:  Summary  Date Value Ref Range Status  01/19/2024 FINAL  Final    Comment:    ==================================================================== Compliance Drug Analysis, Ur ==================================================================== Test                             Result       Flag       Units  Drug Present and Declared for Prescription Verification   7-aminoclonazepam              36           EXPECTED   ng/mg creat    7-aminoclonazepam is an expected metabolite of clonazepam . Source of    clonazepam  is a scheduled prescription medication.    Oxycodone                       2076         EXPECTED   ng/mg creat   Oxymorphone                    106          EXPECTED   ng/mg creat   Noroxycodone                   3254         EXPECTED   ng/mg creat   Noroxymorphone                 255          EXPECTED   ng/mg creat    Sources of oxycodone  are scheduled prescription medications.    Oxymorphone, noroxycodone, and noroxymorphone are expected    metabolites of oxycodone . Oxymorphone is also available as a    scheduled prescription medication.    Gabapentin                      PRESENT      EXPECTED   Zonisamide                      PRESENT      EXPECTED   Tizanidine                      PRESENT      EXPECTED   Trazodone                       PRESENT      EXPECTED   1,3 chlorophenyl piperazine    PRESENT       EXPECTED    1,3-chlorophenyl piperazine is an expected metabolite of trazodone .    Duloxetine                      PRESENT  EXPECTED   Quetiapine                      PRESENT      EXPECTED   Acetaminophen                   PRESENT      EXPECTED  Drug Present not Declared for Prescription Verification   Alcohol , Ethyl                 0.040        UNEXPECTED g/dL    Sources of ethyl alcohol  include alcoholic beverages or as a    fermentation product of glucose; glucose is present in this specimen.    Interpret result with caution, as the presence of ethyl alcohol  is    likely due, at least in part, to fermentation of glucose.    Ibuprofen                       PRESENT      UNEXPECTED   Naproxen                        PRESENT      UNEXPECTED ==================================================================== Test                      Result    Flag   Units      Ref Range   Creatinine              186              mg/dL      >=79 ==================================================================== Declared Medications:  The flagging and interpretation on this report are based on the  following declared medications.  Unexpected results may arise from  inaccuracies in the declared medications.   **Note: The testing scope of this panel includes these medications:   Clonazepam  (Klonopin )  Duloxetine  (Cymbalta )  Gabapentin  (Neurontin )  Oxycodone  (Percocet)  Quetiapine  (Seroquel )  Trazodone  (Desyrel )  Zonisamide  (Zonegran )   **Note: The testing scope of this panel does not include small to  moderate amounts of these reported medications:   Acetaminophen  (Percocet)  Tizanidine  (Zanaflex )   **Note: The testing scope of this panel does not include the  following reported medications:   Albuterol  (Airsupra)  Atogepant  (Qulipta )  Budesonide  (Airsupra)  Budesonide  (Symbicort)  Buspirone  (Buspar )  Cetirizine  (Zyrtec )  Eletriptan  (Relpax )  Empagliflozin  (Jardiance )  Famotidine   (Pepcid )  Fluconazole  (Diflucan )  Formoterol  (Symbicort)  Montelukast  (Singulair )  Naloxone  (Narcan )  Ondansetron  (Zofran )  Pantoprazole  (Protonix )  Potassium Chloride   Prazosin  (Minipress )  Semaglutide  (Ozempic )  Vibegron (Gemtesa) ==================================================================== For clinical consultation, please call [phone]. ====================================================================     No results found for: CBDTHCR No results found for: D8THCCBX No results found for: D9THCCBX  ROS  Constitutional: Denies any fever or chills Gastrointestinal: No reported hemesis, hematochezia, vomiting, or acute GI distress Musculoskeletal: Denies any acute onset joint swelling, redness, loss of ROM, or weakness Neurological: No reported episodes of acute onset apraxia, aphasia, dysarthria, agnosia, amnesia, paralysis, loss of coordination, or loss of consciousness  Medication Review  Albuterol -Budesonide , Atogepant , DULoxetine , FreeStyle Libre 3 Plus Sensor, QUEtiapine , Semaglutide  (1 MG/DOSE), buPROPion , budesonide -formoterol , busPIRone , celecoxib , cetirizine , clonazePAM , eletriptan , empagliflozin , famotidine , gabapentin , hydrOXYzine , mirabegron  ER, montelukast , naloxone , ondansetron , pantoprazole , potassium chloride , promethazine , tiZANidine , traZODone , and zonisamide   History Review  Allergy: Andrea [name] is allergic to bactrim [sulfamethoxazole -trimethoprim], codeine, and sulfa antibiotics. Drug: Andrea [name]  reports no history  of drug use. Alcohol :  reports no history of alcohol  use. Tobacco:  reports that she has never smoked. She has never used smokeless tobacco. Social: Andrea [name]  reports that she has never smoked. She has never used smokeless tobacco. She reports that she does not drink alcohol  and does not use drugs. Medical:  has a past medical history of Allergy, Anemia, Anxiety, Arthritis, Asthma, Bipolar 1 disorder (HCC), Bipolar 1  disorder, mixed, severe (HCC) (12/20/2016), Bipolar I disorder, most recent episode depressed (HCC) (12/20/2016), Complication of anesthesia, Depression, Diabetes mellitus without complication (HCC), Drug induced akathisia (04/02/2022), GERD (gastroesophageal reflux disease), Hypertension, Kidney stones, Long term current use of antipsychotic medication (03/13/2022), Low back pain, Migraines, Neuromuscular disorder (HCC), PCOS (polycystic ovarian syndrome), PONV (postoperative nausea and vomiting), Schizophrenia (HCC), Seizure (HCC) (10/03/2021), Seizures (HCC) (06/04/2016), and Shortness of breath dyspnea. Surgical: Andrea [name]  has a past surgical history that includes Cholecystectomy; LYMPH NODES REMOVED; ovarian cyst removed; Intrauterine device (iud) insertion (06/14/2014); Abdominal hysterectomy (N/A, 11/14/2015); Salpingoophorectomy (Bilateral, 11/14/2015); ORIF ankle fracture (Right, 05/03/2023); Ankle Fusion (Right, 05/13/2023); and Fracture surgery. Family: family history includes Breast cancer in her maternal grandmother; Depression in her maternal aunt and maternal aunt; Diabetes in her maternal aunt; Healthy in her brother and sister; Heart disease in her mother; Hypertension in her father and mother; Miscarriages / Stillbirths in her mother.  Laboratory Chemistry Profile   Renal Lab Results  Component Value Date   BUN 13 05/05/2024   CREATININE 0.77 05/05/2024   BCR 19 04/27/2024   GFR 90.27 01/16/2024   GFRAA >60 01/01/2020   GFRNONAA >60 05/05/2024    Hepatic Lab Results  Component Value Date   AST 19 05/03/2024   ALT 17 05/03/2024   ALBUMIN 4.6 05/03/2024   ALKPHOS 162 (H) 05/03/2024   AMYLASE 54 08/18/2007   LIPASE 57 (H) 03/30/2023    Electrolytes Lab Results  Component Value Date   NA 143 05/05/2024   K 3.9 05/05/2024   CL 105 05/05/2024   CALCIUM 9.4 05/05/2024   MG 2.1 01/19/2024   PHOS 4.5 11/26/2021    Bone Lab Results  Component Value Date   VD25OH 38.99  01/16/2024    Inflammation (CRP: Acute Phase) (ESR: Chronic Phase) Lab Results  Component Value Date   CRP 0.8 05/05/2024   ESRSEDRATE 6 05/05/2024   LATICACIDVEN 1.6 05/27/2023         Note: Above Lab results reviewed.  Recent Imaging Review  DG Foot 2 Views Right EXAM: 2 VIEW(S) XRAY OF THE RIGHT FOOT 05/05/2024 08:25:00 PM  COMPARISON: None available.  CLINICAL HISTORY: recent rod removal, now w/ drainage from surgical site  FINDINGS:  BONES AND JOINTS: Partially visualized tibiotalar arthrodesis screws in place. Bridging callus is difficult to identify on this limited examination. No definite osseous erosions identified. Screw tracts within the distal tibia. Diffusely decreased bone density. Plantar calcaneal spur. No acute fracture. No joint dislocation.  SOFT TISSUES: Soft tissue swelling and subcutaneous gas along the plantar aspect of the hindfoot.  IMPRESSION: 1. Soft tissue swelling and subcutaneous gas along the plantar aspect of the hindfoot. 2. Partially visualized tibiotalar arthrodesis screws in place. Bridging callus is difficult to identify on this limited examination. No definite osseous erosions identified. Screw tracts within the distal tibia.  Electronically signed by: Morgane Naveau MD 05/05/2024 08:50 PM EST RP Workstation: [id number] Note: Reviewed        Physical Exam  Vitals: BP (!) 127/91 (Cuff Size: Large)   Pulse 86  Temp (!) 97.2 F (36.2 C) (Temporal)   Resp 16   Ht 5' 11 (1.803 m)   Wt 274 lb (124.3 kg)   LMP  (LMP Unknown)   SpO2 98%   BMI 38.22 kg/m  BMI: Estimated body mass index is 38.22 kg/m as calculated from the following:   Height as of this encounter: 5' 11 (1.803 m).   Weight as of this encounter: 274 lb (124.3 kg). Ideal: Ideal body weight: 70.8 kg (156 lb 1.4 oz) Adjusted ideal body weight: 92.2 kg (203 lb 4 oz) General appearance: Well nourished, well developed, and well hydrated. In no apparent acute  distress Mental status: Alert, oriented x 3 (person, place, & time)       Respiratory: No evidence of acute respiratory distress Eyes: PERLA   Assessment   Diagnosis Status  1. Chronic ankle pain (1ry area of Pain) (Right)   2. S/P ankle fusion (Right)   3. Chronic postoperative pain of ankle (Right)   4. Closed ankle fracture, sequela (Right)   5. Closed trimalleolar fracture of ankle, sequela (Right)    Controlled Controlled Controlled   Updated Problems: Problem  S/P ankle fusion (Right)  Disorder of Musculoskeletal System  Muscle Weakness  Abnormal Gait  Delayed Union of Fracture of Ankle, Right, Closed  Morbid Obesity With Bmi of 40.0-44.9, Adult (Hcc)    Plan of Care  Problem-specific:  Assessment and Plan    Chronic postprocedural pain of right ankle   Chronic postprocedural pain persists in the right ankle following surgery with external fixator placement and removal. The joint is fused with two screws, eliminating movement and causing pain during weight-bearing activities. No physical therapy has been initiated post-surgery. Steroid injections are planned to reduce swelling and pain, with caution due to diabetes-related infection risk and potential blood sugar elevation. Steroid injections are preferred over oral steroids for their lower systemic impact. Scheduled steroid injections in the right ankle. Referred to physical therapy for rehabilitation and home exercise guidance. The physical therapist will contact the surgeon for specific post-operative instructions.  Closed fracture of right ankle, sequela   The right ankle's closed fracture has healed following surgical joint fusion, but post-surgical pain and swelling continue. Nonsteroidal anti-inflammatory drugs and oral steroids are avoided due to diabetes management concerns. Physical therapy is emphasized to aid recovery and prevent compensatory pain from altered gait. Physical therapy will include exercises to  correct gait abnormalities and prevent compensatory pain in other areas. Monitor for signs of infection post-injection and provide prophylactic antibiotics if necessary.       Andrea [name] has a current medication list which includes the following long-term medication(s): bupropion , duloxetine , eletriptan , famotidine , gabapentin , montelukast , pantoprazole , potassium chloride , symbicort, trazodone , zonisamide , clonazepam , and promethazine .  Pharmacotherapy (Medications Ordered): No orders of the defined types were placed in this encounter.  Orders:  Orders Placed This Encounter  Procedures   Large Joint Injection/Arthrocentesis    Level(s): True ankle Laterality: Right Sedation: With Sedation Purpose: Diagnostic Indication(s): Sub-acute pain    Standing Status:   Future    Expiration Date:   09/05/2024    Scheduling Instructions:     Requested Scheduling Timeframe: As allowed by the schedule   Ambulatory referral to Physical Therapy    Referral Priority:   Routine    Referral Type:   Physical Medicine    Referral Reason:   Specialty Services Required    Requested Specialty:   Physical Therapy    Number of Visits Requested:  1     Interventional Therapies  Risk Factors  Considerations  Medical Comorbidities:  MO (BMI>40)  T2NIDDM  Hx. Seizures  HTN  Hypotension w/ syncope  BA  PTSD  Bipolar Disorder  GAD  Hx. Panic Disorder  schizoaffective disorder  GERD  BNZ Use  Opioid Analgesic Use     Planned  Pending:   Diagnostic/therapeutic right ankle steroid injection #1 (preprocedure antibiotics needed)  Referral to physical therapy ordered (06/07/2024)    Under consideration:   Diagnostic/therapeutic right ankle steroid injection #1 (preprocedure antibiotics needed)    Completed: (Analgesic benefit)1  None at this time   Therapeutic  Palliative (PRN) options:   None established   Completed by other providers:   None reported  1(Analgesic benefit):  Expressed in percentage (%). (Local anesthetic[LA] +/- sedation  L.A.Local Anesthetic  Steroid benefit  Ongoing benefit)  Pharmacotherapy  Prescription sent to the pharmacy for Narcan  since she is taking benzodiazepines and opioids and has increased risk of respiratory depression secondary to a drug to drug interaction.       Return for (ECT): (R) Ankle steroid inj. #1 (prophylactic Abx needed).    Recent Visits Date Type Provider Dept  04/12/24 Office Visit Tanya Glisson, MD [facility]  Showing recent visits within past 90 days and meeting all other requirements Today's Visits Date Type Provider Dept  06/07/24 Office Visit Tanya Glisson, MD [facility]  Showing today's visits and meeting all other requirements Future Appointments No visits were found meeting these conditions. Showing future appointments within next 90 days and meeting all other requirements  I discussed the assessment and treatment plan with the patient. The patient was provided an opportunity to ask questions and all were answered. The patient agreed with the plan and demonstrated an understanding of the instructions.  Patient advised to call back or seek an in-person evaluation if the symptoms or condition worsens.  Duration of encounter: 44 minutes.  Total time on encounter, as per AMA guidelines included both the face-to-face and non-face-to-face time personally spent by the physician and/or other qualified health care professional(s) on the day of the encounter (includes time in activities that require the physician or other qualified health care professional and does not include time in activities normally performed by clinical staff). Physician's time may include the following activities when performed: Preparing to see the patient (e.g., pre-charting review of records, searching for previously ordered imaging, lab work, and nerve conduction tests) Review of prior analgesic  pharmacotherapies. Reviewing PMP Interpreting ordered tests (e.g., lab work, imaging, nerve conduction tests) Performing post-procedure evaluations, including interpretation of diagnostic procedures Obtaining and/or reviewing separately obtained history Performing a medically appropriate examination and/or evaluation Counseling and educating the patient/family/caregiver Ordering medications, tests, or procedures Referring and communicating with other health care professionals (when not separately reported) Documenting clinical information in the electronic or other health record Independently interpreting results (not separately reported) and communicating results to the patient/ family/caregiver Care coordination (not separately reported)  Note by: Glisson DELENA Tanya, MD (TTS and AI technology used. I apologize for any typographical errors that were not detected and corrected.) Date: 06/07/2024; Time: 1:59 PM

## 2024-06-08 ENCOUNTER — Encounter: Payer: Self-pay | Admitting: Nurse Practitioner

## 2024-06-08 NOTE — Assessment & Plan Note (Signed)
 The infection is likely viral, and no antibiotics are initiated as symptoms just started. She is advised to use over-the-counter medications such as Mucinex  and Robitussin for symptom relief and to monitor symptoms, reporting if there is no improvement in one week.

## 2024-06-08 NOTE — Assessment & Plan Note (Signed)
 A1c has improved today to 6.2 from 7.1 with no hypoglycemic episodes, though she reports excessive thirst. The Dexcom is malfunctioning. Continue Ozempic  1 mg and Jardiance . Switched to [place] 3 for glucose monitoring. Encourage healthy diet.

## 2024-06-08 NOTE — Assessment & Plan Note (Signed)
 There is significant improvement in anxiety and depression with recent medication adjustments. She has no current thoughts of self-harm or harm to others. Continue the current psychiatric medication regimen and monitor mood changes due to recent medication adjustments. Follow up with psychiatry as scheduled.

## 2024-06-08 NOTE — Assessment & Plan Note (Signed)
 She prefers Celebrex  over other anti-inflammatories and is advised against concurrent use of other anti-inflammatories. Celebrex  200 mg is prescribed twice daily, and she is instructed to avoid other NSAIDs such as ibuprofen , Aleve , and meloxicam . Continue Gabapentin  as prescribed. Follow up with pain management as scheduled.

## 2024-06-09 ENCOUNTER — Telehealth: Payer: Self-pay

## 2024-06-09 ENCOUNTER — Other Ambulatory Visit (HOSPITAL_COMMUNITY): Payer: Self-pay

## 2024-06-09 NOTE — Telephone Encounter (Signed)
 Pharmacy Patient Advocate Encounter   Received notification from Patient Advice Request messages that prior authorization for FreeStyle Libre 3 Plus Sensor is required/requested.   Insurance verification completed.   The patient is insured through [employer].   To help increase the likelihood of successful prior authorization for a continuous glucose monitor, please review the documentation and include evidence of at least two hypoglycemic events (insurers often require glucose values <=54 mg/dL) and/or clear documentation that the patient relies on insulin  to adequately control their diabetes. Thank you for your partnership.

## 2024-06-11 ENCOUNTER — Ambulatory Visit: Payer: Self-pay

## 2024-06-11 NOTE — Telephone Encounter (Signed)
 FYI Only or Action Required?: FYI only for provider: appointment scheduled on 12.30.25.  Patient was last seen in primary care on 06/02/2024 by Andrea App, NP.  Called Nurse Triage reporting Abdominal Pain.  Symptoms began several days ago.  Interventions attempted: OTC medications: Zantac, pepcid  and Prescription medications: Protonix .  Symptoms are: gradually worsening.  Triage Disposition: See HCP Within 4 Hours (Or PCP Triage)  Patient/caregiver understands and will follow disposition?: Yes  Copied from CRM [id number]. Topic: Clinical - Red Word Triage >> Jun 11, 2024  2:06 PM Andrea [name] wrote: Red Word that prompted transfer to Nurse Triage: Seen provider 06/02/2024 for stomach pain, it has continue and gotten worst.Burps smell horrible, after eating stomach pain is unbearable, nausea. Reason for Disposition  [1] MILD-MODERATE pain AND [2] constant AND [3] present > 2 hours  Answer Assessment - Initial Assessment Questions 1. LOCATION: Where does it hurt?       Upper and lower abdomen  2. RADIATION: Does the pain shoot anywhere else? (e.g., chest, back)      denies  3. ONSET: When did the pain begin? (e.g., minutes, hours or days ago)       Over one week  4. SUDDEN: Gradual or sudden onset?      suddenly  5. PATTERN Does the pain come and go, or is it constant?      Comes and goes  6. SEVERITY: How bad is the pain?  (e.g., Scale 1-10; mild, moderate, or severe)      8/10  7. RECURRENT SYMPTOM: Have you ever had this type of stomach pain before? If Yes, ask: When was the last time? and What happened that time?       Yes, recurrent  8. CAUSE: What do you think is causing the stomach pain? (e.g., gallstones, recent abdominal surgery)      Pt unsure  9. RELIEVING/AGGRAVATING FACTORS: What makes it better or worse? (e.g., antacids, bending or twisting motion, bowel movement)      Pt denies medications being helpful, protonix , pepcid , and  zantac  10. OTHER SYMPTOMS:  Pt denies  back pain, fever, urination pain, vomiting        Pt reports nausea and diarrhea    Pt reports abdominal pain Pt is taking OTC Protonix , pepcid  an zantac ER precautions given to pt Pt scheduled for a visit on 12.30.25  for further evaluation at a different clinic due to no availability at pt's home clinic. Pt agrees with plan of care, will call back for any worsening symptoms  Protocols used: Abdominal Pain - [facility]

## 2024-06-15 ENCOUNTER — Other Ambulatory Visit (HOSPITAL_COMMUNITY): Payer: Self-pay

## 2024-06-15 ENCOUNTER — Other Ambulatory Visit: Payer: Self-pay

## 2024-06-15 ENCOUNTER — Encounter: Payer: Self-pay | Admitting: Family Medicine

## 2024-06-15 ENCOUNTER — Ambulatory Visit: Payer: MEDICAID | Admitting: Family Medicine

## 2024-06-15 ENCOUNTER — Ambulatory Visit: Payer: Self-pay | Admitting: Family Medicine

## 2024-06-15 ENCOUNTER — Telehealth: Payer: Self-pay

## 2024-06-15 ENCOUNTER — Emergency Department
Admission: EM | Admit: 2024-06-15 | Discharge: 2024-06-15 | Disposition: A | Discharge status: Home / Self Care | Payer: MEDICAID | Attending: Emergency Medicine | Admitting: Emergency Medicine

## 2024-06-15 ENCOUNTER — Encounter: Payer: Self-pay | Admitting: Emergency Medicine

## 2024-06-15 ENCOUNTER — Encounter: Payer: Self-pay | Admitting: Pain Medicine

## 2024-06-15 ENCOUNTER — Emergency Department: Payer: MEDICAID

## 2024-06-15 VITALS — BP 118/84 | HR 80 | Temp 97.9°F | Ht 71.0 in | Wt 280.0 lb

## 2024-06-15 DIAGNOSIS — E119 Type 2 diabetes mellitus without complications: Secondary | ICD-10-CM | POA: Insufficient documentation

## 2024-06-15 DIAGNOSIS — Z23 Encounter for immunization: Secondary | ICD-10-CM

## 2024-06-15 DIAGNOSIS — M778 Other enthesopathies, not elsewhere classified: Secondary | ICD-10-CM | POA: Diagnosis not present

## 2024-06-15 DIAGNOSIS — R110 Nausea: Secondary | ICD-10-CM | POA: Diagnosis not present

## 2024-06-15 DIAGNOSIS — R1010 Upper abdominal pain, unspecified: Secondary | ICD-10-CM | POA: Insufficient documentation

## 2024-06-15 DIAGNOSIS — I10 Essential (primary) hypertension: Secondary | ICD-10-CM | POA: Diagnosis not present

## 2024-06-15 DIAGNOSIS — M7731 Calcaneal spur, right foot: Secondary | ICD-10-CM

## 2024-06-15 DIAGNOSIS — M25571 Pain in right ankle and joints of right foot: Secondary | ICD-10-CM | POA: Diagnosis present

## 2024-06-15 DIAGNOSIS — Z9889 Other specified postprocedural states: Secondary | ICD-10-CM

## 2024-06-15 LAB — BASIC METABOLIC PANEL WITH GFR
BUN: 20 mg/dL (ref 6–23)
CO2: 28 meq/L (ref 19–32)
Calcium: 9.3 mg/dL (ref 8.4–10.5)
Chloride: 107 meq/L (ref 96–112)
Creatinine, Ser: 0.85 mg/dL (ref 0.40–1.20)
GFR: 83.69 mL/min
Glucose, Bld: 132 mg/dL — ABNORMAL HIGH (ref 70–99)
Potassium: 3.8 meq/L (ref 3.5–5.1)
Sodium: 143 meq/L (ref 135–145)

## 2024-06-15 LAB — CBC WITH DIFFERENTIAL/PLATELET
Basophils Absolute: 0.1 K/uL (ref 0.0–0.1)
Basophils Relative: 0.8 % (ref 0.0–3.0)
Eosinophils Absolute: 0.3 K/uL (ref 0.0–0.7)
Eosinophils Relative: 4.3 % (ref 0.0–5.0)
HCT: 39.4 % (ref 36.0–46.0)
Hemoglobin: 12.7 g/dL (ref 12.0–15.0)
Lymphocytes Relative: 33.9 % (ref 12.0–46.0)
Lymphs Abs: 2.4 K/uL (ref 0.7–4.0)
MCHC: 32.3 g/dL (ref 30.0–36.0)
MCV: 89.3 fl (ref 78.0–100.0)
Monocytes Absolute: 0.4 K/uL (ref 0.1–1.0)
Monocytes Relative: 5.2 % (ref 3.0–12.0)
Neutro Abs: 4 K/uL (ref 1.4–7.7)
Neutrophils Relative %: 55.8 % (ref 43.0–77.0)
Platelets: 215 K/uL (ref 150.0–400.0)
RBC: 4.42 Mil/uL (ref 3.87–5.11)
RDW: 13.4 % (ref 11.5–15.5)
WBC: 7.1 K/uL (ref 4.0–10.5)

## 2024-06-15 LAB — HEPATIC FUNCTION PANEL
ALT: 18 U/L (ref 3–35)
AST: 12 U/L (ref 5–37)
Albumin: 4.3 g/dL (ref 3.5–5.2)
Alkaline Phosphatase: 108 U/L (ref 39–117)
Bilirubin, Direct: 0 mg/dL — ABNORMAL LOW (ref 0.1–0.3)
Total Bilirubin: 0.3 mg/dL (ref 0.2–1.2)
Total Protein: 6.4 g/dL (ref 6.0–8.3)

## 2024-06-15 LAB — LIPASE: Lipase: 58 U/L (ref 11.0–59.0)

## 2024-06-15 MED ORDER — CYCLOBENZAPRINE HCL 5 MG PO TABS: 5.0000 mg | ORAL_TABLET | Freq: Three times a day (TID) | ORAL | 0 refills | Status: DC | PRN

## 2024-06-15 MED ORDER — CYCLOBENZAPRINE HCL 10 MG PO TABS
10.0000 mg | ORAL_TABLET | Freq: Once | ORAL | Status: AC
  Administered 2024-06-15: 10 mg via ORAL
  Filled 2024-06-15: qty 1

## 2024-06-15 MED ORDER — QULIPTA 60 MG PO TABS: ORAL_TABLET | ORAL | 3 refills | Status: AC

## 2024-06-15 NOTE — Telephone Encounter (Signed)
 Pt needs a new pa for her new RX for qulipta  60mg  PA

## 2024-06-15 NOTE — Progress Notes (Signed)
 "  Subjective:    Patient ID: Andrea [name], female    DOB: June 19, 1980, 43 y.o.   MRN: [id number]  HPI  Wt Readings from Last 3 Encounters:  06/15/24 280 lb (127 kg)  06/07/24 274 lb (124.3 kg)  06/02/24 274 lb 12.8 oz (124.6 kg)   39.05 kg/m  Vitals:   06/15/24 0809  BP: 118/84  Pulse: 80  Temp: 97.9 F (36.6 C)  SpO2: 96%    43 yo pt of NP Gretel presents with c/o Abdominal pain - 2 weeks  Swollen right foot   RUQ abd pain  Burps and has bad smell  Ccy in the past  Not for stones but gallbladder was not working   After she eats -has a stabbing pain in RUQ  Did go down on ozempic  - did not help Nausea is still bad as well Not vomiting (just nausea after eating)   Either constipated or diarrhea  2 weeks also  No blood in stool  No black stool   Taking celebrex  for sciatica and also ankle pain (multiple surgeries on it)    2 meds for acid  Bad GERD- protonix  and pepcid    No fever  Was achy all over    Pmhx notable for  DM2 HTN Bipolar schizoaffective disorder with anxiety and insomnia  Chronic pain syndrome  Obesity -taking glp1  (causing nausea in past)  Likely fatty liver Migraine   Protonix  is on medicine list   Recently dx with viral uri with cough  Neg flu/covid and strep tests Prescription benzonatate   That is better   Lab Results  Component Value Date   NA 143 06/15/2024   K 3.8 06/15/2024   CO2 28 06/15/2024   GLUCOSE 132 (H) 06/15/2024   BUN 20 06/15/2024   CREATININE 0.85 06/15/2024   CALCIUM 9.3 06/15/2024   GFR 83.69 06/15/2024   EGFR 72 04/27/2024   GFRNONAA >60 05/05/2024   Lab Results  Component Value Date   ALT 18 06/15/2024   AST 12 06/15/2024   ALKPHOS 108 06/15/2024   BILITOT 0.3 06/15/2024   Lab Results  Component Value Date   WBC 7.1 06/15/2024   HGB 12.7 06/15/2024   HCT 39.4 06/15/2024   MCV 89.3 06/15/2024   PLT 215.0 06/15/2024   Lab Results  Component Value Date   LIPASE 58.0 06/15/2024    Lab Results  Component Value Date   HGBA1C 6.2 (A) 05/19/2024   HGBA1C 6.2 05/19/2024   HGBA1C 6.2 05/19/2024   HGBA1C 6.2 05/19/2024       Patient Active Problem List   Diagnosis Date Noted   Upper abdominal pain 06/15/2024   S/P ankle fusion (Right) 06/07/2024   Postprandial nausea 06/02/2024   Hematuria 05/03/2024   Osteopenia 04/29/2024   Urinary incontinence 03/11/2024   UTI symptoms 01/26/2024   Long term prescription benzodiazepine use 01/19/2024   Long-term current use of opiate analgesic 01/19/2024   Chronic prescription opiate use 01/19/2024   Closed ankle fracture, sequela (Right) 01/19/2024   Chronic postoperative pain of ankle (Right) 01/19/2024   Chronic pain syndrome 01/18/2024   Pharmacologic therapy 01/18/2024   Disorder of skeletal system 01/18/2024   Problems influencing health status 01/18/2024   Delayed union of ankle fracture, closed (Right) 01/14/2024   Fall at home, sequela 01/14/2024   Neuropathic ulcer of heel, limited to breakdown of skin (HCC) (Right) 01/14/2024   Elevated blood pressure reading 12/26/2023   Schizoaffective disorder, bipolar type (HCC) 12/05/2023  Diabetic nephropathy associated with type 2 diabetes mellitus (HCC) 07/23/2023   Acute right-sided low back pain without sciatica 07/18/2023   Fluctuating blood pressure 07/18/2023   Hypotension 07/02/2023   Occasional tremors 07/02/2023   Constipation 07/02/2023   Syncope due to orthostatic hypotension 06/20/2023   Closed trimalleolar fracture of ankle, sequela (Right) 05/20/2023   Panic disorder 05/20/2023   History of removal of internal fixation device 05/20/2023   Primary localized osteoarthrosis of ankle and foot 05/20/2023   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) 05/20/2023   Morbid obesity with BMI of 40.0-44.9, adult (HCC) 05/20/2023   Disorder of musculoskeletal system 05/20/2023   Muscle weakness 05/20/2023   Abnormal gait 05/20/2023    Delayed union of fracture of ankle, right, closed 05/20/2023   Acquired varus deformity of ankle (Right) 05/13/2023   Postoperative surgical complication involving musculoskeletal system associated with musculoskeletal procedure 05/12/2023   Type 2 diabetes mellitus with peripheral neuropathy (HCC) 05/03/2023   Mild persistent asthma 05/03/2023   Pre-syncope 05/03/2023   Chronic ankle pain (1ry area of Pain) (Right) 07/16/2022   Chronic post-traumatic stress disorder 03/13/2022   Generalized anxiety disorder with panic attacks 03/13/2022   Major depressive disorder, 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 03/13/2022   Other insomnia 03/13/2022   Long term current use of antipsychotic medication 03/13/2022   Convulsion (HCC) 11/26/2021   HTN (hypertension) 09/18/2016   Migraines 10/30/2012   Obesity, Class III, BMI 40-49.9 (morbid obesity) (HCC) 10/30/2012   Kidney stones    Past Medical History:  Diagnosis Date   Allergy    Anemia    Anxiety    Arthritis    Asthma    Bipolar 1 disorder (HCC)    Bipolar 1 disorder, mixed, severe (HCC) 12/20/2016   Bipolar I disorder, most recent episode depressed (HCC) 12/20/2016   Complication of anesthesia    hard time waking up    Depression    Diabetes mellitus without complication (HCC)    Drug induced akathisia 04/02/2022   GERD (gastroesophageal reflux disease)    no meds   Hypertension    Kidney stones    Long term current use of antipsychotic medication 03/13/2022   Low back pain    Migraines    Neuromuscular disorder (HCC)    PCOS (polycystic ovarian syndrome)    PONV (postoperative nausea and vomiting)    Schizophrenia (HCC)    Seizure (HCC) 10/03/2021   Seizures (HCC) 06/04/2016   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   Shortness of breath dyspnea    with bronchitis   Past Surgical History:  Procedure Laterality Date    ABDOMINAL HYSTERECTOMY N/A 11/14/2015   Procedure: HYSTERECTOMY ABDOMINAL;  Surgeon: Oneil FORBES Piety, MD;  Location: [facility];  Service: Gynecology;  Laterality: N/A;   ANKLE FUSION Right 05/13/2023   Procedure: ARTHRODESIS ANKLE;  Surgeon: Malvin Marsa FALCON, DPM;  Location: [facility];  Service: Orthopedics/Podiatry;  Laterality: Right;  Hardware removal, right ankle tibiot talo calcaneal arthrodesis   CHOLECYSTECTOMY     FRACTURE SURGERY     INTRAUTERINE DEVICE (IUD) INSERTION  06/14/2014   [facility]   LYMPH NODES REMOVED     ORIF ANKLE FRACTURE Right 05/03/2023   Procedure: OPEN REDUCTION INTERNAL FIXATION (ORIF) ANKLE FRACTURE;  Surgeon: Silva Juliene SAUNDERS, DPM;  Location: [facility];  Service: Orthopedics/Podiatry;  Laterality: Right;   ovarian cyst removed  SALPINGOOPHORECTOMY Bilateral 11/14/2015   Procedure: SALPINGO OOPHORECTOMY;  Surgeon: Oneil FORBES Piety, MD;  Location: [facility];  Service: Gynecology;  Laterality: Bilateral;   Social History[1] Family History  Problem Relation Age of Onset   Miscarriages / Stillbirths Mother    Hypertension Mother    Heart disease Mother    Hypertension Father    Healthy Sister    Depression Maternal Aunt    Depression Maternal Aunt    Diabetes Maternal Aunt    Breast cancer Maternal Grandmother    Healthy Brother    Allergies[2] Medications Ordered Prior to Encounter[3]  Review of Systems  Constitutional:  Positive for fatigue. Negative for activity change, appetite change, fever and unexpected weight change.  HENT:  Negative for congestion, ear pain, rhinorrhea, sinus pressure and sore throat.   Eyes:  Negative for pain, redness and visual disturbance.  Respiratory:  Negative for cough, shortness of breath and wheezing.   Cardiovascular:  Negative for chest pain and palpitations.  Gastrointestinal:  Positive for abdominal pain, constipation, diarrhea and nausea. Negative for blood in stool and vomiting.  Endocrine:  Negative for polydipsia and polyuria.  Genitourinary:  Negative for dysuria, frequency and urgency.  Musculoskeletal:  Positive for arthralgias. Negative for back pain and myalgias.       Chronic right ankle pain and swelling-post surgical  Skin:  Negative for pallor and rash.  Allergic/Immunologic: Negative for environmental allergies.  Neurological:  Negative for dizziness, syncope and headaches.  Hematological:  Negative for adenopathy. Does not bruise/bleed easily.  Psychiatric/Behavioral:  Negative for decreased concentration and dysphoric mood. The patient is not nervous/anxious.        Objective:   Physical Exam Constitutional:      General: She is not in acute distress.    Appearance: She is well-developed. She is obese. She is not ill-appearing or diaphoretic.  HENT:     Head: Normocephalic and atraumatic.  Eyes:     Conjunctiva/sclera: Conjunctivae normal.     Pupils: Pupils are equal, round, and reactive to light.  Neck:     Thyroid : No thyromegaly.     Vascular: No carotid bruit or JVD.  Cardiovascular:     Rate and Rhythm: Normal rate and regular rhythm.     Heart sounds: Normal heart sounds.     No gallop.  Pulmonary:     Effort: Pulmonary effort is normal. No respiratory distress.     Breath sounds: Normal breath sounds. No stridor. No wheezing, rhonchi or rales.  Abdominal:     General: Abdomen is protuberant. Bowel sounds are normal. There is no distension or abdominal bruit.     Palpations: Abdomen is soft. There is no hepatomegaly, splenomegaly, mass or pulsatile mass.     Tenderness: There is abdominal tenderness in the right upper quadrant and epigastric area. There is no right CVA tenderness, left CVA tenderness, guarding or rebound. Positive signs include Murphy's sign. Negative signs include McBurney's sign.     Hernia: No hernia is present.  Musculoskeletal:     Cervical back: Normal range of motion and neck supple.     Right lower leg: No edema.      Left lower leg: No edema.  Lymphadenopathy:     Cervical: No cervical adenopathy.  Skin:    General: Skin is warm and dry.     Coloration: Skin is not pale.     Findings: No rash.  Neurological:     Mental Status: She is alert.  Coordination: Coordination normal.     Deep Tendon Reflexes: Reflexes are normal and symmetric. Reflexes normal.  Psychiatric:        Mood and Affect: Mood normal.           Assessment & Plan:   Problem List Items Addressed This Visit       Other   Upper abdominal pain - Primary   In pt with history of ccy for gallbladder dysfunction , also taking glp1, who recently cut dose  On ppi and H2 for GERD (not mentioned on problem list)  2 weeks Also intermittent diarrhea/constipation and nausea w/o vomiting and foul smelling burps  Also started celebrex  for chronic ankle pain 2 wk ago Worse on right but tender in ruq and epigastrium  Otherwise reassuring exam   Discussed possible of gastritis, cbd stone, med side effect , worse gerd   Instructed to hold glp-1 for 1-2 weeks Hold celebrex   Continue H2 and ppi Lab today   May need imaging if not improvement  Call back and Er precautions noted in detail today    Will reach out to pcp Needs follow up with her in 1-2 wk        Relevant Orders   Basic metabolic panel with GFR (Completed)   Hepatic function panel (Completed)   CBC with Differential/Platelet (Completed)   Lipase (Completed)   Postprandial nausea   Relevant Orders   Basic metabolic panel with GFR (Completed)   Hepatic function panel (Completed)   CBC with Differential/Platelet (Completed)   Lipase (Completed)   Other Visit Diagnoses       Encounter for immunization       Relevant Orders   Flu vaccine trivalent PF, 6mos and older(Flulaval,Afluria,Fluarix,Fluzone) (Completed)         [1]  Social History Tobacco Use   Smoking status: Never   Smokeless tobacco: Never  Vaping Use   Vaping status: Never Used   Substance Use Topics   Alcohol  use: Never   Drug use: Never  [2]  Allergies Allergen Reactions   Bactrim [Sulfamethoxazole -Trimethoprim] Hives and Itching   Codeine Hives and Itching   Sulfa Antibiotics Itching  [3]  Current Outpatient Medications on File Prior to Visit  Medication Sig Dispense Refill   Albuterol -Budesonide  (AIRSUPRA) 90-80 MCG/ACT AERO      buPROPion  (WELLBUTRIN  XL) 150 MG 24 hr tablet Take 1 tablet (150 mg total) by mouth daily. 30 tablet 2   busPIRone  (BUSPAR ) 10 MG tablet Take 1 tablet (10 mg total) by mouth 3 (three) times daily. 90 tablet 2   cetirizine  (ZYRTEC ) 10 MG tablet Take 10 mg by mouth at bedtime.     clonazePAM  (KLONOPIN ) 0.5 MG tablet Take 1 tablet (0.5 mg total) by mouth 3 (three) times daily as needed for anxiety (sleep). 90 tablet 2   Continuous Glucose Sensor (FREESTYLE LIBRE 3 PLUS SENSOR) MISC Change sensor every 15 days. 2 each 11   DULoxetine  (CYMBALTA ) 60 MG capsule Take 2 capsules (120 mg total) by mouth daily. 180 capsule 0   eletriptan  (RELPAX ) 40 MG tablet Take 1 tablet at onset of migraine. May repeat in 2 hours if headache persists or recurs. Do not take more than 3 a week 10 tablet 5   empagliflozin  (JARDIANCE ) 25 MG TABS tablet Take 25 mg by mouth.     famotidine  (PEPCID ) 40 MG tablet Take 1 tablet (40 mg total) by mouth 2 (two) times daily. 180 tablet 0   gabapentin  (NEURONTIN ) 600 MG tablet  Take 1 tablet (600 mg total) by mouth 3 (three) times daily. 90 tablet 2   hydrOXYzine  (VISTARIL ) 50 MG capsule Take 1 capsule (50 mg total) by mouth every 4 (four) hours as needed for anxiety. 180 capsule 2   mirabegron  ER (MYRBETRIQ ) 25 MG TB24 tablet Take 25 mg by mouth daily.     montelukast  (SINGULAIR ) 10 MG tablet TAKE 1 TABLET BY MOUTH EVERYDAY AT BEDTIME 90 tablet 0   naloxone  (NARCAN ) nasal spray 4 mg/0.1 mL Place 1 spray into the nose as needed for up to 365 doses (for opioid-induced respiratory depresssion). In case of emergency  (overdose), spray once into each nostril. If no response within 3 minutes, repeat application and call 911. 1 each 1   ondansetron  (ZOFRAN -ODT) 4 MG disintegrating tablet Take 2 tablets (8 mg total) by mouth every 8 (eight) hours as needed for nausea or vomiting. 30 tablet 0   pantoprazole  (PROTONIX ) 40 MG tablet TAKE 1 TABLET BY MOUTH EVERY DAY IN THE MORNING 90 tablet 1   potassium chloride  (MICRO-K ) 10 MEQ CR capsule TAKE 1 CAPSULE BY MOUTH EVERY DAY 90 capsule 3   promethazine  (PHENERGAN ) 25 MG tablet Take 1 tablet (25 mg total) by mouth every 8 (eight) hours as needed for nausea or vomiting. 30 tablet 0   QUEtiapine  (SEROQUEL ) 100 MG tablet Take 100 mg by mouth 2 (two) times daily. Take 100 mg by mouth in AM and 200 mg by mouth in PM (Patient taking differently: Take 100 mg by mouth at bedtime. Take 100 mg by mouth in AM and 200 mg by mouth in PM)     SYMBICORT 160-4.5 MCG/ACT inhaler Inhale 2 puffs into the lungs.     tiZANidine  (ZANAFLEX ) 4 MG tablet Take 1 tablet (4 mg total) by mouth every 8 (eight) hours as needed. 270 tablet 3   traZODone  (DESYREL ) 150 MG tablet TAKE 1 TABLET BY MOUTH AT BEDTIME. 90 tablet 3   zonisamide  (ZONEGRAN ) 100 MG capsule Take 5 capsules every night 150 capsule 11   Atogepant  (QULIPTA ) 60 MG TABS Take 1 tablet daily 90 tablet 3   celecoxib  (CELEBREX ) 200 MG capsule Take 1 capsule (200 mg total) by mouth 2 (two) times daily. (Patient not taking: Reported on 06/15/2024) 180 capsule 3   Semaglutide , 1 MG/DOSE, 4 MG/3ML SOPN Inject 1 mg as directed once a week. (Patient not taking: Reported on 06/15/2024) 3 mL 1   No current facility-administered medications on file prior to visit.   "

## 2024-06-15 NOTE — ED Provider Notes (Incomplete)
 "   [facility] Emergency Department Provider Note     Event Date/Time   First MD Initiated Contact with Patient 06/15/24 2123     (approximate)   History   Ankle Pain   HPI  Andrea [name] is a 43 y.o. 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  Procedure was performed by Dr. Ozell Brave, DPM at [facility].  Patient recently began full weightbearing without her cam boot or any supportive devices last week, after being nonweightbearing in a wheelchair for approximately 13 weeks.  She presents to the ED from home, reporting some moderate ankle swelling.  She is also reporting some stabbing pain in the heel of her foot.  No recent injury, trauma, fall.  Patient denies any chest pain or shortness of breath.  No calf or Achilles tenderness is noted.  Patient attempted to contact her podiatrist office, but has not received a return phone call.  She presents to the ED for evaluation.  Physical Exam   Triage Vital Signs: ED Triage Vitals  Encounter Vitals Group     BP 06/15/24 2029 123/86     Girls Systolic BP Percentile --      Girls Diastolic BP Percentile --      Boys Systolic BP Percentile --      Boys Diastolic BP Percentile --      Pulse Rate 06/15/24 2029 88     Resp 06/15/24 2029 18     Temp 06/15/24 2029 98 F (36.7 C)     Temp Source 06/15/24 2029 Oral     SpO2 06/15/24 2029 94 %     Weight 06/15/24 2029 270 lb (122.5 kg)     Height 06/15/24 2029 5' 11 (1.803 m)     Head Circumference --      Peak Flow --      Pain Score 06/15/24 2035 9     Pain Loc --      Pain Education --      Exclude from Growth Chart --     Most recent vital signs: Vitals:   06/15/24 2115 06/15/24 2235  BP:  121/84  Pulse:  84  Resp:  20  Temp:    SpO2: 97% 100%    General Awake, no distress.  NAD HEENT NCAT. PERRL. EOMI. No rhinorrhea. Mucous membranes are moist.  CV:  Good peripheral perfusion. No CCE distally RESP:  Normal effort.  MSK:  Right ankle with multiple well-healed surgical scars to the right ankle consistent with ORIF history.  There is some subtle medial and lateral ankle soft tissue swelling.  No calf or Achilles tenderness is elicited.  Decreased flexion extension range of motion at the ankle due to talar fusion.  NEURONormal gross sensation   ED Results / Procedures / Treatments   Labs (all labs ordered are listed, but only abnormal results are displayed) Labs Reviewed - No data to display   EKG   RADIOLOGY  I personally viewed and evaluated these images as part of my medical decision making, as well as reviewing the written report by the radiologist.  ED Provider Interpretation: No acute findings  DG Ankle Complete Right Result Date: 06/15/2024 CLINICAL DATA:  Right ankle pain. History tibiotalar and subtalar fusion. EXAM: RIGHT ANKLE - COMPLETE 3+ VIEW COMPARISON:  Radiographs 05/05/2024 and CT  scan 03/31/2024. Left hip FINDINGS: Stable long cannulated cortical fusion screws extending from the calcaneus across the subtalar joints and into the tibial metaphysis. No complicating features associated with the hardware. The subtalar fusion appears solid posteriorly and incomplete anteriorly. The tibiotalar fusion also appears partially incomplete. CT may be helpful for better evaluation as indicated. Remote resection of the distal fibula. No acute bony findings or destructive bony changes to suggest osteomyelitis. IMPRESSION: 1. Stable long cannulated cortical fusion screws extending from the calcaneus across the subtalar joints and into the tibial metaphysis. 2. The subtalar fusion appears solid posteriorly and incomplete anteriorly. 3. The tibiotalar fusion also appears partially incomplete. 4. No acute bony findings. Electronically Signed   By: MYRTIS Stammer M.D.    On: 06/15/2024 21:53     PROCEDURES:  Critical Care performed: No  Procedures   MEDICATIONS ORDERED IN ED: Medications  cyclobenzaprine  (FLEXERIL ) tablet 10 mg (10 mg Oral Given 06/15/24 2224)     IMPRESSION / MDM / ASSESSMENT AND PLAN / ED COURSE  I reviewed the triage vital signs and the nursing notes.                              Differential diagnosis includes, but is not limited to, ***  Patient's presentation is most consistent with {EM COPA:27473}  {**The patient is on the cardiac monitor to evaluate for evidence of arrhythmia and/or significant heart rate changes.**}  Patient's diagnosis is consistent with ***. Patient will be discharged home with prescriptions for ***. Patient is to follow up with *** as needed or otherwise directed. Patient is given ED precautions to return to the ED for any worsening or new symptoms.     FINAL CLINICAL IMPRESSION(S) / ED DIAGNOSES   Final diagnoses:  Status post open reduction with internal fixation (ORIF) of fracture of ankle  Heel spur, right     Rx / DC Orders   ED Discharge Orders          Ordered    cyclobenzaprine  (FLEXERIL ) 5 MG tablet  3 times daily PRN        06/15/24 2215             Note:  This document was prepared using Dragon voice recognition software and may include unintentional dictation errors.  "

## 2024-06-15 NOTE — Patient Instructions (Addendum)
 Hold the celebrex  - unsure if it is causing your symptoms but   Hold your ozempic  for 1-2 weeks to give you gut a beak   Continue your pepcid  and protonix     Lab today    Schedule follow up with [facility] in the next few weeks for a re check   Call [facility] about the ankle

## 2024-06-15 NOTE — Telephone Encounter (Signed)
 PA not needed. Current PA will cover this dose as well.

## 2024-06-15 NOTE — Telephone Encounter (Signed)
 I did not get her message that she was fine with increasing to 60mg . I sent in Rx today for 60mg  tablet, thanks

## 2024-06-15 NOTE — ED Provider Notes (Signed)
 "   [facility] Emergency Department Provider Note     Event Date/Time   First MD Initiated Contact with Patient 06/15/24 2123     (approximate)   History   Ankle Pain   HPI  ALEGRA ROST is a 43 y.o. 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  Procedure was performed by Dr. Ozell Brave, DPM at [facility].  Patient recently began full weightbearing without her cam boot or any supportive devices last week, after being nonweightbearing in a wheelchair for approximately 13 weeks.  She presents to the ED from home, reporting some moderate ankle swelling.  She is also reporting some stabbing pain in the heel of her foot.  No recent injury, trauma, fall.  Patient denies any chest pain or shortness of breath.  No calf or Achilles tenderness is noted.  Patient attempted to contact her podiatrist office, but has not received a return phone call.  She presents to the ED for evaluation.  Physical Exam   Triage Vital Signs: ED Triage Vitals  Encounter Vitals Group     BP 06/15/24 2029 123/86     Girls Systolic BP Percentile --      Girls Diastolic BP Percentile --      Boys Systolic BP Percentile --      Boys Diastolic BP Percentile --      Pulse Rate 06/15/24 2029 88     Resp 06/15/24 2029 18     Temp 06/15/24 2029 98 F (36.7 C)     Temp Source 06/15/24 2029 Oral     SpO2 06/15/24 2029 94 %     Weight 06/15/24 2029 270 lb (122.5 kg)     Height 06/15/24 2029 5' 11 (1.803 m)     Head Circumference --      Peak Flow --      Pain Score 06/15/24 2035 9     Pain Loc --      Pain Education --      Exclude from Growth Chart --     Most recent vital signs: Vitals:   06/15/24 2115 06/15/24 2235  BP:  121/84  Pulse:  84  Resp:  20  Temp:    SpO2: 97% 100%    General Awake, no distress.  NAD HEENT NCAT. PERRL. EOMI. No rhinorrhea. Mucous membranes are moist.  CV:  Good peripheral perfusion. No CCE distally RESP:  Normal effort.  MSK:  Right ankle with multiple well-healed surgical scars to the right ankle consistent with ORIF history.  There is some subtle medial and lateral ankle soft tissue swelling.  No calf or Achilles tenderness is elicited.  Decreased flexion extension range of motion at the ankle due to talar fusion.  Patient able demonstrate normal toe dorsiflexion and eversion as well as ankle eversion and inversion range.  No evidence of wound dehiscence, purulence, erythema, warmth, or tenderness. NEURO: Normal gross sensation   ED Results / Procedures / Treatments   Labs (all labs ordered are listed, but only abnormal results are displayed) Labs Reviewed - No data to display   EKG   RADIOLOGY  I personally viewed and evaluated these images as part of my medical decision making, as well as reviewing the written report by the radiologist.  ED Provider Interpretation: No acute findings  DG Ankle Complete  Right Result Date: 06/15/2024 CLINICAL DATA:  Right ankle pain. History tibiotalar and subtalar fusion. EXAM: RIGHT ANKLE - COMPLETE 3+ VIEW COMPARISON:  Radiographs 05/05/2024 and CT scan 03/31/2024. Left hip FINDINGS: Stable long cannulated cortical fusion screws extending from the calcaneus across the subtalar joints and into the tibial metaphysis. No complicating features associated with the hardware. The subtalar fusion appears solid posteriorly and incomplete anteriorly. The tibiotalar fusion also appears partially incomplete. CT may be helpful for better evaluation as indicated. Remote resection of the distal fibula. No acute bony findings or destructive bony changes to suggest osteomyelitis. IMPRESSION: 1. Stable long cannulated cortical fusion screws extending from the calcaneus across the subtalar joints and into the tibial metaphysis. 2. The subtalar  fusion appears solid posteriorly and incomplete anteriorly. 3. The tibiotalar fusion also appears partially incomplete. 4. No acute bony findings. Electronically Signed   By: MYRTIS Stammer M.D.   On: 06/15/2024 21:53     PROCEDURES:  Critical Care performed: No  Procedures   MEDICATIONS ORDERED IN ED: Medications  cyclobenzaprine  (FLEXERIL ) tablet 10 mg (10 mg Oral Given 06/15/24 2224)     IMPRESSION / MDM / ASSESSMENT AND PLAN / ED COURSE  I reviewed the triage vital signs and the nursing notes.                              Differential diagnosis includes, but is not limited to, postop swelling, postop complication, hardware disruption, periprosthetic fracture, plantar fasciitis, heel spurs  Patient's presentation is most consistent with acute complicated illness / injury requiring diagnostic workup.  Patient's diagnosis is consistent with postop pain following ORIF to the right ankle, and evidence of heel spurs.  Ankle fusion, stable hardware, and chronic plantar spurs, noted on my interpretation of plain films.  No evidence of any acute PERI fracture or hardware disruption.  No clinical evidence of infection, low concern for infectious etiology or wound dehiscence.  Patient is likely experiencing expected postsurgical swelling after transitioning from NWB patient will be discharged home with prescriptions for full weightbearing in the last week.  No evidence of any acute or concerning findings on exam.  Patient is otherwise reassured that she can experience several months of likely intermittent swelling to the foot and ankle patient also has evidence of heel spurs come likely leading to some of her acute heel pain with weightbearing.  Patient is placed in ASO lace up brace for some soft support.  She is encouraged to rest with the foot elevated when not walking, and to perform range of motion exercises regularly. Patient is to follow up with her podiatrist as discussed, as needed or  otherwise directed. Patient is given ED precautions to return to the ED for any worsening or new symptoms.    FINAL CLINICAL IMPRESSION(S) / ED DIAGNOSES   Final diagnoses:  Status post open reduction with internal fixation (ORIF) of fracture of ankle  Heel spur, right     Rx / DC Orders   ED Discharge Orders          Ordered    cyclobenzaprine  (FLEXERIL ) 5 MG tablet  3 times daily PRN        06/15/24 2215             Note:  This document was prepared using Dragon voice recognition software and may include unintentional dictation errors.    Marlow Berenguer, Candida LULLA Kings, PA-C 06/16/24 0006    Willo,  Carlin, MD 06/16/24 2219  "

## 2024-06-15 NOTE — Addendum Note (Signed)
 Addended by: GEORJEAN DARICE HERO on: 06/15/2024 10:04 AM   Modules accepted: Orders

## 2024-06-15 NOTE — Discharge Instructions (Signed)
 Your exam and x-ray are normal and reassuring at this time and no signs of any serious post operative injury or infection.  Elected to experience several months of intermittent swelling to the foot and ankle joint as you increase your activity.  Rest with the foot elevated when seated.  Perform range of motion exercises as demonstrated.  Apply ice to reduce swelling.  Use the lace up ankle brace as needed for support.  Follow-up with your podiatrist for ongoing care and reevaluation.

## 2024-06-15 NOTE — ED Triage Notes (Signed)
 Pt arrives from home reporting right ankle swelling since October, pt had surgery on ankle; pt just started walking last week. Pt reports increased pain and stabbing sensation in the heel; denies new injury. +pedal pulse. Tylenol  around 1500.

## 2024-06-15 NOTE — Telephone Encounter (Signed)
 Pt called an informed that she should be good to pick up her RX that the PA for the 30 mg is good for the 60 mg per the PA team

## 2024-06-15 NOTE — Assessment & Plan Note (Signed)
 In pt with history of ccy for gallbladder dysfunction , also taking glp1, who recently cut dose  On ppi and H2 for GERD (not mentioned on problem list)  2 weeks Also intermittent diarrhea/constipation and nausea w/o vomiting and foul smelling burps  Also started celebrex  for chronic ankle pain 2 wk ago Worse on right but tender in ruq and epigastrium  Otherwise reassuring exam   Discussed possible of gastritis, cbd stone, med side effect , worse gerd   Instructed to hold glp-1 for 1-2 weeks Hold celebrex   Continue H2 and ppi Lab today   May need imaging if not improvement  Call back and Er precautions noted in detail today    Will reach out to pcp Needs follow up with her in 1-2 wk

## 2024-06-16 ENCOUNTER — Encounter: Payer: Self-pay | Admitting: Physician Assistant

## 2024-06-16 ENCOUNTER — Ambulatory Visit: Payer: MEDICAID | Admitting: Physician Assistant

## 2024-06-16 VITALS — BP 101/70 | HR 73 | Ht 71.0 in | Wt 270.0 lb

## 2024-06-16 DIAGNOSIS — R3129 Other microscopic hematuria: Secondary | ICD-10-CM

## 2024-06-16 DIAGNOSIS — N390 Urinary tract infection, site not specified: Secondary | ICD-10-CM | POA: Diagnosis not present

## 2024-06-16 DIAGNOSIS — R1024 Suprapubic pain: Secondary | ICD-10-CM

## 2024-06-16 LAB — URINALYSIS, COMPLETE
Bilirubin, UA: NEGATIVE
Ketones, UA: NEGATIVE
Leukocytes,UA: NEGATIVE
Nitrite, UA: NEGATIVE
Protein,UA: NEGATIVE
Specific Gravity, UA: 1.025 (ref 1.005–1.030)
Urobilinogen, Ur: 0.2 mg/dL (ref 0.2–1.0)
pH, UA: 6 (ref 5.0–7.5)

## 2024-06-16 LAB — MICROSCOPIC EXAMINATION

## 2024-06-16 LAB — BLADDER SCAN AMB NON-IMAGING

## 2024-06-16 MED ORDER — TAMSULOSIN HCL 0.4 MG PO CAPS: 0.4000 mg | ORAL_CAPSULE | Freq: Every day | ORAL | 0 refills | Status: DC

## 2024-06-16 NOTE — Patient Instructions (Signed)
 CT scan scheduling: [id number]

## 2024-06-16 NOTE — Progress Notes (Signed)
 "  06/16/2024 10:32 AM   Andrea [name] Jun 21, 1980 [id number]  CC: Chief Complaint  Patient presents with   Urinary Tract Infection   HPI: Andrea [name] is a 43 y.o. female with PMH diabetes on Jardiance , nephrolithiasis, and seizures on zonisamide  who presents today as a new patient for evaluation of UTIs.   Today she reports 5 UTIs in the past 3 months.  Her symptoms typically include urinary incontinence requiring depends, suprapubic pain, and sometimes dysuria.  She has occasionally had fevers with these.  Her symptoms resolve with antibiotics but recurr not long later.   Most recent antibiotics were 2 to 3 weeks ago.  She is having some dysuria and low back pain today.  Urine culture history as follows: 05/20/2024: MDR E. coli 05/03/2024 MDR E. coli 04/20/2024: No growth 01/06/2024: MDR E. coli 07/18/2023: No growth  CT stone study dated 07/18/2023 showed bilateral nonobstructing punctate nephrolithiasis.  In-office UA today positive for 3+ glucose and trace intact blood; urine microscopy with 3-10 RBCs/HPF. PVR 5mL.  PMH: Past Medical History:  Diagnosis Date   Allergy    Anemia    Anxiety    Arthritis    Asthma    Bipolar 1 disorder (HCC)    Bipolar 1 disorder, mixed, severe (HCC) 12/20/2016   Bipolar I disorder, most recent episode depressed (HCC) 12/20/2016   Complication of anesthesia    hard time waking up    Depression    Diabetes mellitus without complication (HCC)    Drug induced akathisia 04/02/2022   GERD (gastroesophageal reflux disease)    no meds   Hypertension    Kidney stones    Long term current use of antipsychotic medication 03/13/2022   Low back pain    Migraines    Neuromuscular disorder (HCC)    PCOS (polycystic ovarian syndrome)    PONV (postoperative nausea and vomiting)    Schizophrenia (HCC)    Seizure (HCC) 10/03/2021   Seizures (HCC) 06/04/2016   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   Shortness of breath  dyspnea    with bronchitis    Surgical History: Past Surgical History:  Procedure Laterality Date   ABDOMINAL HYSTERECTOMY N/A 11/14/2015   Procedure: HYSTERECTOMY ABDOMINAL;  Surgeon: Oneil FORBES Piety, MD;  Location: [facility];  Service: Gynecology;  Laterality: N/A;   ANKLE FUSION Right 05/13/2023   Procedure: ARTHRODESIS ANKLE;  Surgeon: Malvin Marsa FALCON, DPM;  Location: [facility];  Service: Orthopedics/Podiatry;  Laterality: Right;  Hardware removal, right ankle tibiot talo calcaneal arthrodesis   CHOLECYSTECTOMY     FRACTURE SURGERY     INTRAUTERINE DEVICE (IUD) INSERTION  06/14/2014   [facility]   LYMPH NODES REMOVED     ORIF ANKLE FRACTURE Right 05/03/2023   Procedure: OPEN REDUCTION INTERNAL FIXATION (ORIF) ANKLE FRACTURE;  Surgeon: Silva Juliene SAUNDERS, DPM;  Location: [facility];  Service: Orthopedics/Podiatry;  Laterality: Right;   ovarian cyst removed     SALPINGOOPHORECTOMY Bilateral 11/14/2015   Procedure: SALPINGO OOPHORECTOMY;  Surgeon: Oneil FORBES Piety, MD;  Location: [facility];  Service: Gynecology;  Laterality: Bilateral;    Home Medications:  Allergies as of 06/16/2024       Reactions   Bactrim [sulfamethoxazole -trimethoprim] Hives, Itching   Codeine Hives, Itching   Sulfa Antibiotics Itching        Medication List        Accurate as of June 16, 2024 10:32 AM. If you have any questions, ask your nurse or doctor.  STOP taking these medications    celecoxib  200 MG capsule Commonly known as: CeleBREX  Stopped by: Lucie Hones   FreeStyle Libre 3 Plus Sensor Misc Stopped by: Namari Breton   Semaglutide  (1 MG/DOSE) 4 MG/3ML Sopn Stopped by: Antion Andres       TAKE these medications    Airsupra 90-80 MCG/ACT Aero Generic drug: Albuterol -Budesonide    buPROPion  150 MG 24 hr tablet Commonly known as: WELLBUTRIN  XL Take 1 tablet (150 mg total) by mouth daily.   busPIRone  10 MG tablet Commonly known  as: BUSPAR  Take 1 tablet (10 mg total) by mouth 3 (three) times daily.   cetirizine  10 MG tablet Commonly known as: ZYRTEC  Take 10 mg by mouth at bedtime.   clonazePAM  0.5 MG tablet Commonly known as: KlonoPIN  Take 1 tablet (0.5 mg total) by mouth 3 (three) times daily as needed for anxiety (sleep).   cyclobenzaprine  5 MG tablet Commonly known as: FLEXERIL  Take 1 tablet (5 mg total) by mouth 3 (three) times daily as needed.   DULoxetine  60 MG capsule Commonly known as: CYMBALTA  Take 2 capsules (120 mg total) by mouth daily.   eletriptan  40 MG tablet Commonly known as: Relpax  Take 1 tablet at onset of migraine. May repeat in 2 hours if headache persists or recurs. Do not take more than 3 a week   empagliflozin  25 MG Tabs tablet Commonly known as: JARDIANCE  Take 25 mg by mouth.   famotidine  40 MG tablet Commonly known as: PEPCID  Take 1 tablet (40 mg total) by mouth 2 (two) times daily.   gabapentin  600 MG tablet Commonly known as: NEURONTIN  Take 1 tablet (600 mg total) by mouth 3 (three) times daily.   hydrOXYzine  50 MG capsule Commonly known as: VISTARIL  Take 1 capsule (50 mg total) by mouth every 4 (four) hours as needed for anxiety.   mirabegron  ER 25 MG Tb24 tablet Commonly known as: MYRBETRIQ  Take 25 mg by mouth daily.   montelukast  10 MG tablet Commonly known as: SINGULAIR  TAKE 1 TABLET BY MOUTH EVERYDAY AT BEDTIME   naloxone  4 MG/0.1ML Liqd nasal spray kit Commonly known as: NARCAN  Place 1 spray into the nose as needed for up to 365 doses (for opioid-induced respiratory depresssion). In case of emergency (overdose), spray once into each nostril. If no response within 3 minutes, repeat application and call 911.   ondansetron  4 MG disintegrating tablet Commonly known as: ZOFRAN -ODT Take 2 tablets (8 mg total) by mouth every 8 (eight) hours as needed for nausea or vomiting.   pantoprazole  40 MG tablet Commonly known as: PROTONIX  TAKE 1 TABLET BY MOUTH EVERY  DAY IN THE MORNING   potassium chloride  10 MEQ CR capsule Commonly known as: MICRO-K  TAKE 1 CAPSULE BY MOUTH EVERY DAY   promethazine  25 MG tablet Commonly known as: PHENERGAN  Take 1 tablet (25 mg total) by mouth every 8 (eight) hours as needed for nausea or vomiting.   QUEtiapine  100 MG tablet Commonly known as: SEROQUEL  Take 100 mg by mouth 2 (two) times daily. Take 100 mg by mouth in AM and 200 mg by mouth in PM What changed: when to take this   Qulipta  60 MG Tabs Generic drug: Atogepant  Take 1 tablet daily   Symbicort 160-4.5 MCG/ACT inhaler Generic drug: budesonide -formoterol  Inhale 2 puffs into the lungs.   traZODone  150 MG tablet Commonly known as: DESYREL  TAKE 1 TABLET BY MOUTH AT BEDTIME.   zonisamide  100 MG capsule Commonly known as: ZONEGRAN  Take 5 capsules every night  Allergies:  Allergies[1]  Family History: Family History  Problem Relation Age of Onset   Miscarriages / Stillbirths Mother    Hypertension Mother    Heart disease Mother    Hypertension Father    Healthy Sister    Depression Maternal Aunt    Depression Maternal Aunt    Diabetes Maternal Aunt    Breast cancer Maternal Grandmother    Healthy Brother     Social History:   reports that she has never smoked. She has never used smokeless tobacco. She reports that she does not drink alcohol  and does not use drugs.  Physical Exam: BP 101/70   Pulse 73   Ht 5' 11 (1.803 m)   Wt 270 lb (122.5 kg)   LMP  (LMP Unknown)   BMI 37.66 kg/m   Constitutional:  Alert and oriented, no acute distress, nontoxic appearing HEENT: [place], AT Cardiovascular: No clubbing, cyanosis, or edema Respiratory: Normal respiratory effort, no increased work of breathing Skin: No rashes, bruises or suspicious lesions Neurologic: Grossly intact, no focal deficits, moving all 4 extremities Psychiatric: Normal mood and affect  Laboratory Data: Results for orders placed or performed in visit on 06/16/24   BLADDER SCAN AMB NON-IMAGING   Collection Time: 06/16/24 10:05 AM  Result Value Ref Range   Scan Result 5ml    Assessment & Plan:   1. Recurrent UTI (Primary) Recurrent MDR E. coli UTIs.  Jardiance  likely contributory.  Question possible stone nidus, see below.  I will see her back in 3 weeks and may consider Hiprex suppression in the future.  Will also consider topical vaginal estrogen cream per clinical course as below. - Urinalysis, Complete - BLADDER SCAN AMB NON-IMAGING - CULTURE, URINE COMPREHENSIVE  2. Microscopic hematuria UA today is bland aside for microscopic hematuria.  She is s/p hysterectomy, low suspicion for vaginal contamination.  Question possible stone episode/stone nidus, which could be causing her pain and recurrent infections.  See below.  3. Suprapubic pain UA rather bland, low suspicion for recurrent UTI today.  Question possible stone episode.  With her zonisamide  use and known stone history, will get a CT stone study for further evaluation and see her back for follow-up in 3 weeks. - tamsulosin  (FLOMAX ) 0.4 MG CAPS capsule; Take 1 capsule (0.4 mg total) by mouth daily.  Dispense: 30 capsule; Refill: 0 - CT RENAL STONE STUDY; Future   Return in about 3 weeks (around 07/07/2024) for Follow-up with CT results and UA.  Lucie Hones, PA-C  [facility] [place] [address] [place], [place] [address] [phone]      [1]  Allergies Allergen Reactions   Bactrim [Sulfamethoxazole -Trimethoprim] Hives and Itching   Codeine Hives and Itching   Sulfa Antibiotics Itching   "

## 2024-06-18 ENCOUNTER — Observation Stay: Admission: EM | Admit: 2024-06-18 | Discharge: 2024-06-20 | Disposition: A | Payer: MEDICAID

## 2024-06-18 ENCOUNTER — Ambulatory Visit
Admission: RE | Admit: 2024-06-18 | Discharge: 2024-06-18 | Disposition: A | Discharge status: Home / Self Care | Payer: MEDICAID | Source: Ambulatory Visit | Attending: Physician Assistant | Admitting: Physician Assistant

## 2024-06-18 ENCOUNTER — Ambulatory Visit: Payer: Self-pay | Admitting: Physician Assistant

## 2024-06-18 ENCOUNTER — Other Ambulatory Visit: Payer: Self-pay

## 2024-06-18 DIAGNOSIS — J45909 Unspecified asthma, uncomplicated: Secondary | ICD-10-CM | POA: Diagnosis not present

## 2024-06-18 DIAGNOSIS — Z79899 Other long term (current) drug therapy: Secondary | ICD-10-CM | POA: Diagnosis not present

## 2024-06-18 DIAGNOSIS — R1024 Suprapubic pain: Secondary | ICD-10-CM | POA: Insufficient documentation

## 2024-06-18 DIAGNOSIS — E119 Type 2 diabetes mellitus without complications: Secondary | ICD-10-CM | POA: Insufficient documentation

## 2024-06-18 DIAGNOSIS — I10 Essential (primary) hypertension: Secondary | ICD-10-CM | POA: Insufficient documentation

## 2024-06-18 DIAGNOSIS — K3580 Unspecified acute appendicitis: Secondary | ICD-10-CM | POA: Diagnosis not present

## 2024-06-18 DIAGNOSIS — E1142 Type 2 diabetes mellitus with diabetic polyneuropathy: Secondary | ICD-10-CM

## 2024-06-18 DIAGNOSIS — R1031 Right lower quadrant pain: Secondary | ICD-10-CM | POA: Diagnosis present

## 2024-06-18 DIAGNOSIS — R935 Abnormal findings on diagnostic imaging of other abdominal regions, including retroperitoneum: Secondary | ICD-10-CM

## 2024-06-18 LAB — URINALYSIS, ROUTINE W REFLEX MICROSCOPIC
Bacteria, UA: NONE SEEN
Bilirubin Urine: NEGATIVE
Glucose, UA: >=500 mg/dL — AB
Hgb urine dipstick: NEGATIVE
Ketones, ur: NEGATIVE mg/dL
Leukocytes,Ua: NEGATIVE
Nitrite: NEGATIVE
Protein, ur: NEGATIVE mg/dL
Specific Gravity, Urine: 1.021 (ref 1.005–1.030)
pH: 5 (ref 5.0–8.0)

## 2024-06-18 LAB — CBC
HCT: 41.9 % (ref 36.0–46.0)
Hemoglobin: 13 g/dL (ref 12.0–15.0)
MCH: 28.8 pg (ref 26.0–34.0)
MCHC: 31 g/dL (ref 30.0–36.0)
MCV: 92.7 fL (ref 80.0–100.0)
Platelets: 213 K/uL (ref 150–400)
RBC: 4.52 MIL/uL (ref 3.87–5.11)
RDW: 12.4 % (ref 11.5–15.5)
WBC: 6 K/uL (ref 4.0–10.5)
nRBC: 0 % (ref 0.0–0.2)

## 2024-06-18 LAB — BASIC METABOLIC PANEL WITH GFR
Anion gap: 13 (ref 5–15)
BUN: 21 mg/dL — ABNORMAL HIGH (ref 6–20)
CO2: 25 mmol/L (ref 22–32)
Calcium: 9 mg/dL (ref 8.9–10.3)
Chloride: 105 mmol/L (ref 98–111)
Creatinine, Ser: 0.76 mg/dL (ref 0.44–1.00)
GFR, Estimated: >60 mL/min
Glucose, Bld: 121 mg/dL — ABNORMAL HIGH (ref 70–99)
Potassium: 3.5 mmol/L (ref 3.5–5.1)
Sodium: 143 mmol/L (ref 135–145)

## 2024-06-18 LAB — GLUCOSE, CAPILLARY: Glucose-Capillary: 122 mg/dL — ABNORMAL HIGH (ref 70–99)

## 2024-06-18 MED ORDER — CYCLOBENZAPRINE HCL 10 MG PO TABS: 5.0000 mg | ORAL_TABLET | Freq: Three times a day (TID) | ORAL | Status: DC | PRN

## 2024-06-18 MED ORDER — ELETRIPTAN HYDROBROMIDE 20 MG PO TABS: 40.0000 mg | ORAL_TABLET | ORAL | Status: DC | PRN

## 2024-06-18 MED ORDER — QUETIAPINE FUMARATE 25 MG PO TABS
100.0000 mg | ORAL_TABLET | Freq: Every day | ORAL | Status: DC
  Administered 2024-06-18 – 2024-06-19 (×2): 100 mg via ORAL
  Filled 2024-06-18 (×2): qty 4

## 2024-06-18 MED ORDER — ONDANSETRON HCL 4 MG/2ML IJ SOLN
4.0000 mg | Freq: Once | INTRAMUSCULAR | Status: AC
  Administered 2024-06-18: 4 mg via INTRAVENOUS
  Filled 2024-06-18: qty 2

## 2024-06-18 MED ORDER — INSULIN ASPART 100 UNIT/ML IJ SOLN
0.0000 [IU] | Freq: Three times a day (TID) | INTRAMUSCULAR | Status: DC
  Administered 2024-06-19: 4 [IU] via SUBCUTANEOUS
  Administered 2024-06-20: 7 [IU] via SUBCUTANEOUS
  Administered 2024-06-20: 3 [IU] via SUBCUTANEOUS
  Filled 2024-06-18: qty 4
  Filled 2024-06-18: qty 7
  Filled 2024-06-18: qty 3

## 2024-06-18 MED ORDER — MONTELUKAST SODIUM 10 MG PO TABS
10.0000 mg | ORAL_TABLET | Freq: Every day | ORAL | Status: DC
  Administered 2024-06-18 – 2024-06-19 (×2): 10 mg via ORAL
  Filled 2024-06-18 (×2): qty 1

## 2024-06-18 MED ORDER — HYDROMORPHONE HCL 1 MG/ML IJ SOLN
0.5000 mg | INTRAMUSCULAR | Status: DC | PRN
  Administered 2024-06-18 – 2024-06-20 (×7): 0.5 mg via INTRAVENOUS
  Filled 2024-06-18 (×7): qty 0.5

## 2024-06-18 MED ORDER — MIRABEGRON ER 25 MG PO TB24
25.0000 mg | ORAL_TABLET | Freq: Every day | ORAL | Status: DC
  Administered 2024-06-20: 25 mg via ORAL
  Filled 2024-06-18 (×2): qty 1

## 2024-06-18 MED ORDER — TRAMADOL HCL 50 MG PO TABS
50.0000 mg | ORAL_TABLET | Freq: Four times a day (QID) | ORAL | Status: DC | PRN
  Administered 2024-06-19 – 2024-06-20 (×3): 50 mg via ORAL
  Filled 2024-06-18 (×3): qty 1

## 2024-06-18 MED ORDER — HYDROXYZINE HCL 25 MG PO TABS: 50.0000 mg | ORAL_TABLET | ORAL | Status: DC | PRN

## 2024-06-18 MED ORDER — BUPROPION HCL ER (XL) 150 MG PO TB24
150.0000 mg | ORAL_TABLET | Freq: Every day | ORAL | Status: DC
  Administered 2024-06-20: 150 mg via ORAL
  Filled 2024-06-18 (×2): qty 1

## 2024-06-18 MED ORDER — SIMETHICONE 80 MG PO CHEW
80.0000 mg | CHEWABLE_TABLET | Freq: Four times a day (QID) | ORAL | Status: DC
  Administered 2024-06-18: 80 mg via ORAL
  Filled 2024-06-18 (×2): qty 1

## 2024-06-18 MED ORDER — PROMETHAZINE HCL 25 MG PO TABS
25.0000 mg | ORAL_TABLET | Freq: Three times a day (TID) | ORAL | Status: DC | PRN
  Filled 2024-06-18: qty 1

## 2024-06-18 MED ORDER — PANTOPRAZOLE SODIUM 40 MG IV SOLR
40.0000 mg | Freq: Every day | INTRAVENOUS | Status: DC
  Administered 2024-06-18 – 2024-06-19 (×2): 40 mg via INTRAVENOUS
  Filled 2024-06-18 (×2): qty 10

## 2024-06-18 MED ORDER — ONDANSETRON HCL 4 MG/2ML IJ SOLN
4.0000 mg | Freq: Four times a day (QID) | INTRAMUSCULAR | Status: DC | PRN
  Administered 2024-06-19: 4 mg via INTRAVENOUS
  Filled 2024-06-18: qty 2

## 2024-06-18 MED ORDER — KETOROLAC TROMETHAMINE 15 MG/ML IJ SOLN
15.0000 mg | Freq: Once | INTRAMUSCULAR | Status: AC
  Administered 2024-06-18: 15 mg via INTRAVENOUS
  Filled 2024-06-18: qty 1

## 2024-06-18 MED ORDER — ONDANSETRON 4 MG PO TBDP
4.0000 mg | ORAL_TABLET | Freq: Four times a day (QID) | ORAL | Status: DC | PRN
  Administered 2024-06-19: 4 mg via ORAL
  Filled 2024-06-18: qty 1

## 2024-06-18 MED ORDER — CLONAZEPAM 0.5 MG PO TABS
0.5000 mg | ORAL_TABLET | Freq: Three times a day (TID) | ORAL | Status: DC | PRN
  Administered 2024-06-18: 0.5 mg via ORAL
  Filled 2024-06-18: qty 1

## 2024-06-18 MED ORDER — DOCUSATE SODIUM 100 MG PO CAPS
100.0000 mg | ORAL_CAPSULE | Freq: Two times a day (BID) | ORAL | Status: DC
  Administered 2024-06-18 – 2024-06-20 (×3): 100 mg via ORAL
  Filled 2024-06-18 (×3): qty 1

## 2024-06-18 MED ORDER — LORATADINE 10 MG PO TABS
10.0000 mg | ORAL_TABLET | Freq: Every day | ORAL | Status: DC
  Administered 2024-06-20: 10 mg via ORAL
  Filled 2024-06-18: qty 1

## 2024-06-18 MED ORDER — SODIUM CHLORIDE 0.9 % IV SOLN: INTRAVENOUS | Status: DC

## 2024-06-18 MED ORDER — ATOGEPANT 60 MG PO TABS: 60.0000 mg | ORAL_TABLET | Freq: Every day | ORAL | Status: DC

## 2024-06-18 MED ORDER — ZONISAMIDE 100 MG PO CAPS
500.0000 mg | ORAL_CAPSULE | Freq: Every day | ORAL | Status: DC
  Administered 2024-06-20: 500 mg via ORAL
  Filled 2024-06-18 (×2): qty 5

## 2024-06-18 MED ORDER — MORPHINE SULFATE (PF) 4 MG/ML IV SOLN
4.0000 mg | Freq: Once | INTRAVENOUS | Status: AC
  Administered 2024-06-18: 4 mg via INTRAVENOUS
  Filled 2024-06-18: qty 1

## 2024-06-18 MED ORDER — GABAPENTIN 300 MG PO CAPS
600.0000 mg | ORAL_CAPSULE | Freq: Three times a day (TID) | ORAL | Status: DC
  Administered 2024-06-18 – 2024-06-20 (×4): 600 mg via ORAL
  Filled 2024-06-18 (×4): qty 2

## 2024-06-18 MED ORDER — TAMSULOSIN HCL 0.4 MG PO CAPS
0.4000 mg | ORAL_CAPSULE | Freq: Every day | ORAL | Status: DC
  Administered 2024-06-20: 0.4 mg via ORAL
  Filled 2024-06-18: qty 1

## 2024-06-18 MED ORDER — TRAZODONE HCL 50 MG PO TABS
150.0000 mg | ORAL_TABLET | Freq: Every day | ORAL | Status: DC
  Administered 2024-06-18 – 2024-06-19 (×2): 150 mg via ORAL
  Filled 2024-06-18 (×2): qty 3

## 2024-06-18 MED ORDER — ACETAMINOPHEN 325 MG PO TABS
650.0000 mg | ORAL_TABLET | Freq: Three times a day (TID) | ORAL | Status: DC | PRN
  Administered 2024-06-18: 650 mg via ORAL
  Filled 2024-06-18: qty 2

## 2024-06-18 MED ORDER — FLUTICASONE FUROATE-VILANTEROL 100-25 MCG/ACT IN AEPB
1.0000 | INHALATION_SPRAY | Freq: Every day | RESPIRATORY_TRACT | Status: DC
  Administered 2024-06-20: 1 via RESPIRATORY_TRACT
  Filled 2024-06-18: qty 28

## 2024-06-18 MED ORDER — BUSPIRONE HCL 10 MG PO TABS
10.0000 mg | ORAL_TABLET | Freq: Three times a day (TID) | ORAL | Status: DC
  Administered 2024-06-18 – 2024-06-20 (×4): 10 mg via ORAL
  Filled 2024-06-18 (×2): qty 1
  Filled 2024-06-18 (×4): qty 2
  Filled 2024-06-18: qty 1

## 2024-06-18 MED ORDER — DULOXETINE HCL 30 MG PO CPEP
120.0000 mg | ORAL_CAPSULE | Freq: Every day | ORAL | Status: DC
  Administered 2024-06-20: 120 mg via ORAL
  Filled 2024-06-18: qty 4
  Filled 2024-06-18: qty 2

## 2024-06-18 NOTE — ED Triage Notes (Signed)
 Pt arrives via POV with c/o right sided flank pain in the URQ that started this afternoon and has been progressively getting worse. Pt reports burning and pain when they urinate and pain that is severe enough to make them nauseous. Pt is A&Ox4 during triage.

## 2024-06-18 NOTE — ED Provider Notes (Signed)
 "  [facility] Provider Note    Event Date/Time   First MD Initiated Contact with Patient 06/18/24 1756     (approximate)   History   Flank Pain  Pt arrives via POV with c/o right sided flank pain in the URQ that started this afternoon and has been progressively getting worse. Pt reports burning and pain when they urinate and pain that is severe enough to make them nauseous. Pt is A&Ox4 during triage.   HPI Andrea [name] is a 44 y.o. female PMH migraines, diabetes, bipolar disorder, seizures, kidney stones, schizophrenia presents for evaluation of right lower quadrant abdominal pain -Patient is been having about 1 week of right lower quadrant and right lateral abdomen abdominal pain.  Acutely worse today.  Had outpatient CT which was ordered by her urologist which showed nephrolithiasis but no urolithiasis.  Did incidentally show appendiceal tip dilation concerning for appendicitis. - Felt chills yesterday.  No objective fever measured. -No PO intake take since about 930 today -Pain is 10/10.  Notable nausea but no vomiting.  Not taken any pain medications today. -Does note some dysuria recently -No vaginal bleeding or discharge -Abdominal surgical history of cholecystectomy and hysterectomy  Outpatient notes reviewed in care including urology communication earlier today as well as CT scan earlier today.     Physical Exam   Triage Vital Signs: ED Triage Vitals  Encounter Vitals Group     BP 06/18/24 1741 (!) 121/93     Girls Systolic BP Percentile --      Girls Diastolic BP Percentile --      Boys Systolic BP Percentile --      Boys Diastolic BP Percentile --      Pulse Rate 06/18/24 1741 92     Resp 06/18/24 1741 18     Temp 06/18/24 1741 98.1 F (36.7 C)     Temp Source 06/18/24 1741 Oral     SpO2 06/18/24 1741 98 %     Weight 06/18/24 1752 280 lb (127 kg)     Height 06/18/24 1752 5' 11 (1.803 m)     Head Circumference --      Peak Flow  --      Pain Score 06/18/24 1750 10     Pain Loc --      Pain Education --      Exclude from Growth Chart --     Most recent vital signs: Vitals:   06/18/24 1945 06/18/24 2110  BP:  114/62  Pulse:  (!) 103  Resp:  18  Temp: (!) 97.5 F (36.4 C) 98.7 F (37.1 C)  SpO2:  96%     General: Awake, nontoxic-appearing though does appear quite uncomfortable CV:  Good peripheral perfusion. RRR, RP 2+ Resp:  Normal effort. CTAB Abd:  No distention. + Guarding in right lower quadrant with rebound present.  Somewhat tender in right mid abdomen as well.  No tenderness in left sided abdomen.  No significant CVA tenderness bilaterally.    ED Results / Procedures / Treatments   Labs (all labs ordered are listed, but only abnormal results are displayed) Labs Reviewed  URINALYSIS, ROUTINE W REFLEX MICROSCOPIC - Abnormal; Notable for the following components:      Result Value   Color, Urine YELLOW (*)    APPearance CLEAR (*)    Glucose, UA >=500 (*)    All other components within normal limits  BASIC METABOLIC PANEL WITH GFR - Abnormal; Notable for the following  components:   Glucose, Bld 121 (*)    BUN 21 (*)    All other components within normal limits  GLUCOSE, CAPILLARY - Abnormal; Notable for the following components:   Glucose-Capillary 122 (*)    All other components within normal limits  CBC  HIV ANTIBODY (ROUTINE TESTING W REFLEX)  BASIC METABOLIC PANEL WITH GFR  CBC     EKG  N/a   RADIOLOGY N/a    PROCEDURES:  Critical Care performed: No  Procedures   MEDICATIONS ORDERED IN ED: Medications  acetaminophen  (TYLENOL ) tablet 650 mg (650 mg Oral Given 06/18/24 2100)  traMADol  (ULTRAM ) tablet 50 mg (has no administration in time range)  docusate sodium  (COLACE) capsule 100 mg (100 mg Oral Given 06/18/24 2217)  ondansetron  (ZOFRAN -ODT) disintegrating tablet 4 mg (has no administration in time range)    Or  ondansetron  (ZOFRAN ) injection 4 mg (has no  administration in time range)  0.9 %  sodium chloride  infusion ( Intravenous New Bag/Given 06/18/24 2317)  HYDROmorphone  (DILAUDID ) injection 0.5 mg (0.5 mg Intravenous Given 06/18/24 2232)  pantoprazole  (PROTONIX ) injection 40 mg (40 mg Intravenous Given 06/18/24 2218)  simethicone  (MYLICON) chewable tablet 80 mg (80 mg Oral Given 06/18/24 2216)  insulin  aspart (novoLOG ) injection 0-20 Units (has no administration in time range)  QUEtiapine  (SEROQUEL ) tablet 100 mg (100 mg Oral Given 06/18/24 2216)  DULoxetine  (CYMBALTA ) DR capsule 120 mg (has no administration in time range)  zonisamide  (ZONEGRAN ) capsule 500 mg (has no administration in time range)  eletriptan  (RELPAX ) tablet 40 mg (has no administration in time range)  montelukast  (SINGULAIR ) tablet 10 mg (10 mg Oral Given 06/18/24 2217)  traZODone  (DESYREL ) tablet 150 mg (150 mg Oral Given 06/18/24 2216)  gabapentin  (NEURONTIN ) capsule 600 mg (600 mg Oral Given 06/18/24 2216)  hydrOXYzine  (ATARAX ) tablet 50 mg (has no administration in time range)  buPROPion  (WELLBUTRIN  XL) 24 hr tablet 150 mg (has no administration in time range)  busPIRone  (BUSPAR ) tablet 10 mg (10 mg Oral Given 06/18/24 2232)  mirabegron  ER (MYRBETRIQ ) tablet 25 mg (has no administration in time range)  clonazePAM  (KLONOPIN ) tablet 0.5 mg (0.5 mg Oral Given 06/18/24 2232)  promethazine  (PHENERGAN ) tablet 25 mg (has no administration in time range)  Atogepant  TABS 60 mg (60 mg Oral Not Given 06/18/24 2217)  cyclobenzaprine  (FLEXERIL ) tablet 5 mg (has no administration in time range)  tamsulosin  (FLOMAX ) capsule 0.4 mg (has no administration in time range)  fluticasone  furoate-vilanterol (BREO ELLIPTA ) 100-25 MCG/ACT 1 puff (has no administration in time range)  loratadine  (CLARITIN ) tablet 10 mg (has no administration in time range)  morphine  (PF) 4 MG/ML injection 4 mg (4 mg Intravenous Given 06/18/24 1832)  ketorolac  (TORADOL ) 15 MG/ML injection 15 mg (15 mg Intravenous Given 06/18/24 1831)   ondansetron  (ZOFRAN ) injection 4 mg (4 mg Intravenous Given 06/18/24 1830)  morphine  (PF) 4 MG/ML injection 4 mg (4 mg Intravenous Given 06/18/24 1940)     IMPRESSION / MDM / ASSESSMENT AND PLAN / ED COURSE  I reviewed the triage vital signs and the nursing notes.                              DDX/MDM/AP: Differential diagnosis includes, but is not limited to, appendicitis, consider UTI, CT obtained earlier today already showing no underlying urolithiasis.  Do not clinically suspect PID or pelvic pathology.  Doubt atypical diverticulitis.  Plan: - Maintain n.p.o. -  labs - Pain control -  General Surgery consult - Will defer repeat imaging at this time  Patient's presentation is most consistent with acute presentation with potential threat to life or bodily function.  The patient is on the cardiac monitor to evaluate for evidence of arrhythmia and/or significant heart rate changes.  ED course below.  Labs unremarkable.  Discussed with general surgery who evaluated patient.  Presentation is not entirely convincing for appendicitis but they would like to admit patient overnight for serial abdominal exams and reevaluation.  Will defer surgical intervention antibiotics at this time.  Admitted to surgical service.  Clinical Course as of 06/18/24 2343  [place] Jun 18, 2024  1807 Cbc wnl [MM]  1819 BMP reviewed, unremarkable [MM]  1828 UA unremarkable [MM]  1829 Gen surg paged [MM]  1838 Dr. Tye is currently in surgery, message left to call me back when available [MM]  1931 Spoke with Dr. Tye of general surgery, will evaluate patient shortly [MM]  1931 Patient reevaluated, still in significant pain though nausea has resolved.  Will give further round of morphine . [MM]    Clinical Course User Index [MM] Clarine Ozell LABOR, MD     FINAL CLINICAL IMPRESSION(S) / ED DIAGNOSES   Final diagnoses:  RLQ abdominal pain  Abnormal CT of the abdomen     Rx / DC Orders   ED Discharge Orders      None        Note:  This document was prepared using Dragon voice recognition software and may include unintentional dictation errors.   Clarine Ozell LABOR, MD 06/18/24 [id number]  "

## 2024-06-18 NOTE — H&P (Signed)
 Subjective:   CC: Right lower quadrant pain  HPI:  Andrea [name] is a 44 y.o. female who is consulted by [facility] for evaluation of  above cc.  Symptoms were first noted 1 week ago, at time of exam, although patient reported acute worsening of pain to separate provider earlier in the day.  Reported to nurse that pain just started today at time of check-in.  Pain is sharp, constant, periumbilical, mostly right lower quadrant but also tracking along the entire right abdomen, not so much flank.    Associated with nausea and constipation, with last bowel movement being 3 days ago.she usually has daily bowel movements.  She also still is complaining of persistent dysuria with every episode.  exacerbated by nothing specific.     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  Past Surgical History:  has a past surgical history that includes Cholecystectomy; LYMPH NODES REMOVED; ovarian cyst removed; Intrauterine device (iud) insertion (06/14/2014); Abdominal hysterectomy (N/A, 11/14/2015); Salpingoophorectomy (Bilateral, 11/14/2015); ORIF ankle fracture (Right, 05/03/2023); Ankle Fusion (Right, 05/13/2023); and Fracture surgery.  Family History: family history includes Breast cancer in her maternal grandmother;  Depression in her maternal aunt and maternal aunt; Diabetes in her maternal aunt; Healthy in her brother and sister; Heart disease in her mother; Hypertension in her father and mother; Miscarriages / Stillbirths in her mother.  Social History:  reports that she has never smoked. She has never used smokeless tobacco. She reports that she does not drink alcohol  and does not use drugs.  Current Medications:  Prior to Admission medications  Medication Sig Start Date End Date Taking? Authorizing Provider  Albuterol -Budesonide  (AIRSUPRA) 90-80 MCG/ACT AERO  04/01/23   [provider]  Atogepant  (QULIPTA ) 60 MG TABS Take 1 tablet daily 06/15/24   Georjean Darice HERO, MD  buPROPion  (WELLBUTRIN  XL) 150 MG 24 hr tablet Take 1 tablet (150 mg total) by mouth daily. 05/19/24   Gretel App, NP  busPIRone  (BUSPAR ) 10 MG tablet Take 1 tablet (10 mg total) by mouth 3 (three) times daily. 05/19/24   Gretel App, NP  cetirizine  (ZYRTEC ) 10 MG tablet Take 10 mg by mouth at bedtime. 11/05/23   [provider]  clonazePAM  (KLONOPIN ) 0.5 MG tablet Take 1 tablet (0.5 mg total) by mouth 3 (three) times daily as needed for anxiety (sleep). 06/02/24   Gretel App, NP  cyclobenzaprine  (FLEXERIL ) 5 MG tablet Take 1 tablet (5 mg total) by mouth 3 (three) times daily as needed. 06/15/24   Menshew, Candida LULLA Kings, PA-C  DULoxetine  (CYMBALTA ) 60 MG capsule Take 2 capsules (120 mg total) by mouth daily. 10/13/23   Barbra Jayson LABOR, MD  eletriptan  (RELPAX ) 40 MG tablet Take 1 tablet at onset of migraine. May repeat in 2 hours if headache persists or recurs. Do not take more than 3 a week 01/12/24   Georjean Darice HERO, MD  empagliflozin  (JARDIANCE ) 25 MG TABS tablet Take 25 mg by mouth. 05/21/23   [provider]  famotidine  (PEPCID ) 40  MG tablet Take 1 tablet (40 mg total) by mouth 2 (two) times daily. 07/28/23   Kaur, Charanpreet, NP  gabapentin  (NEURONTIN ) 600 MG tablet Take 1 tablet (600 mg total) by mouth 3  (three) times daily. 05/19/24   Gretel App, NP  hydrOXYzine  (VISTARIL ) 50 MG capsule Take 1 capsule (50 mg total) by mouth every 4 (four) hours as needed for anxiety. 05/19/24   Lester, Kacy, NP  mirabegron  ER (MYRBETRIQ ) 25 MG TB24 tablet Take 25 mg by mouth daily. 05/20/24   [provider]  montelukast  (SINGULAIR ) 10 MG tablet TAKE 1 TABLET BY MOUTH EVERYDAY AT BEDTIME 03/29/24   Kaur, Charanpreet, NP  naloxone  (NARCAN ) nasal spray 4 mg/0.1 mL Place 1 spray into the nose as needed for up to 365 doses (for opioid-induced respiratory depresssion). In case of emergency (overdose), spray once into each nostril. If no response within 3 minutes, repeat application and call 911. 01/19/24 01/18/25  Tanya Glisson, MD  ondansetron  (ZOFRAN -ODT) 4 MG disintegrating tablet Take 2 tablets (8 mg total) by mouth every 8 (eight) hours as needed for nausea or vomiting. 09/06/23   Corlis Burnard DEL, NP  pantoprazole  (PROTONIX ) 40 MG tablet TAKE 1 TABLET BY MOUTH EVERY DAY IN THE MORNING 01/16/24   Kaur, Charanpreet, NP  potassium chloride  (MICRO-K ) 10 MEQ CR capsule TAKE 1 CAPSULE BY MOUTH EVERY DAY 01/21/24   Gretel App, NP  promethazine  (PHENERGAN ) 25 MG tablet Take 1 tablet (25 mg total) by mouth every 8 (eight) hours as needed for nausea or vomiting. 06/02/24   Gretel App, NP  QUEtiapine  (SEROQUEL ) 100 MG tablet Take 100 mg by mouth 2 (two) times daily. Take 100 mg by mouth in AM and 200 mg by mouth in PM Patient taking differently: Take 100 mg by mouth at bedtime. Take 100 mg by mouth in AM and 200 mg by mouth in PM 01/15/24   [provider]  SYMBICORT 160-4.5 MCG/ACT inhaler Inhale 2 puffs into the lungs. 08/13/22   [provider]  tamsulosin  (FLOMAX ) 0.4 MG CAPS capsule Take 1 capsule (0.4 mg total) by mouth daily. 06/16/24   Vaillancourt, Samantha, PA-C  traZODone  (DESYREL ) 150 MG tablet TAKE 1 TABLET BY MOUTH AT BEDTIME. 04/27/24   Gretel App, NP  zonisamide  (ZONEGRAN ) 100 MG capsule  Take 5 capsules every night 01/12/24   Georjean Darice HERO, MD    Allergies:  Allergies as of 06/18/2024 - Review Complete 06/18/2024  Allergen Reaction Noted   Bactrim [sulfamethoxazole -trimethoprim] Hives and Itching 09/14/2013   Codeine Hives and Itching 10/04/2021   Sulfa antibiotics Itching 01/08/2024    ROS:  Pertinent negative and positives noted in HPI   Objective:     BP 123/83   Pulse 85   Temp (!) 97.5 F (36.4 C) (Oral)   Resp 18   Ht 5' 11 (1.803 m)   Wt 127 kg   LMP  (LMP Unknown)   SpO2 93%   BMI 39.05 kg/m    Constitutional :  alert, cooperative, appears stated age, and no distress  Respiratory:  Clear to auscultation bilaterally  Cardiovascular:  Regular rate and rhythm  Gastrointestinal: Soft, no guarding, right lower quadrant tenderness to palpation extending to the right upper quadrant and then towards the periumbilical region..   Skin: Cool and moist, no rash  Psychiatric: Normal affect, non-agitated, not confused       LABS:     Latest Ref Rng & Units 06/18/2024    5:50 PM 06/15/2024  8:38 AM 05/05/2024    8:23 PM  CMP  Glucose 70 - 99 mg/dL 878  867  876   BUN 6 - 20 mg/dL 21  20  13    Creatinine 0.44 - 1.00 mg/dL 9.23  9.14  9.22   Sodium 135 - 145 mmol/L 143  143  143   Potassium 3.5 - 5.1 mmol/L 3.5  3.8  3.9   Chloride 98 - 111 mmol/L 105  107  105   CO2 22 - 32 mmol/L 25  28  24    Calcium 8.9 - 10.3 mg/dL 9.0  9.3  9.4   Total Protein 6.0 - 8.3 g/dL  6.4    Total Bilirubin 0.2 - 1.2 mg/dL  0.3    Alkaline Phos 39 - 117 U/L  108    AST 5 - 37 U/L  12    ALT 3 - 35 U/L  18        Latest Ref Rng & Units 06/18/2024    5:50 PM 06/15/2024    8:38 AM 05/05/2024    8:23 PM  CBC  WBC 4.0 - 10.5 K/uL 6.0  7.1  8.0   Hemoglobin 12.0 - 15.0 g/dL 86.9  87.2  86.8   Hematocrit 36.0 - 46.0 % 41.9  39.4  41.6   Platelets 150 - 400 K/uL 213  215.0  264      RADS: ADDENDUM REPORT: 06/18/2024 14:23   ADDENDUM: Please note, impression #4  should read:   Partially imaged punctate radiodensity in the region of the left anterior descending coronary artery, which may represent CORONARY ARTERY calcification. Assessment for potential risk factor modification, dietary therapy or pharmacologic therapy may be warranted, if clinically indicated.     Electronically Signed   By: Limin  Xu M.D.   On: 06/18/2024 14:23    Addended by Xu, Limin, MD on 06/18/2024  2:25 PM    Study Result  Narrative & Impression  CLINICAL DATA:  One-week history of gross hematuria and right flank pain   EXAM: CT ABDOMEN AND PELVIS WITHOUT CONTRAST   TECHNIQUE: Multidetector CT imaging of the abdomen and pelvis was performed following the standard protocol without IV contrast.   RADIATION DOSE REDUCTION: This exam was performed according to the departmental dose-optimization program which includes automated exposure control, adjustment of the mA and/or kV according to patient size and/or use of iterative reconstruction technique.   COMPARISON:  CT abdomen and pelvis dated 07/18/2023   FINDINGS: Lower chest: No focal consolidation or pulmonary nodule in the lung bases. No pleural effusion or pneumothorax demonstrated. Partially imaged heart size is normal. Partially imaged punctate radiodensity in the region of the left anterior descending coronary artery (2:1).   Hepatobiliary: No focal hepatic lesions. No intra or extrahepatic biliary ductal dilation. Cholecystectomy.   Pancreas: No focal lesions or main ductal dilation.   Spleen: Enlarged spleen measures 15.0 cm in AP dimension, unchanged.   Adrenals/Urinary Tract: No adrenal nodules. No suspicious renal mass or hydronephrosis. Punctate nonobstructing right upper pole stone. Multiple nonobstructing left renal calculi measuring up to 3 mm. No focal bladder wall thickening.   Stomach/Bowel: Normal appearance of the stomach. No evidence of bowel wall thickening, distention, or  inflammatory changes. Dilated appendiceal tip, located along the inferomedial margin of the right hepatic lobe (2:44) measures up to 8 mm with mild periappendiceal stranding.   Vascular/Lymphatic: No significant vascular findings are present. Accessory inferior right renal artery. No enlarged abdominal or pelvic lymph nodes.  Reproductive: No adnexal masses.   Other: No free fluid, fluid collection, or free air.   Musculoskeletal: No acute or abnormal lytic or blastic osseous lesions. Healing anterior right fifth rib fracture.   IMPRESSION: 1. Dilated appendiceal tip, located along the inferomedial margin of the right hepatic lobe, measures up to 8 mm with mild periappendiceal stranding, raising suspicion for early acute tip appendicitis. 2. Bilateral nonobstructing renal calculi measuring up to 3 mm on the left and 2 mm on the right. 3. Unchanged splenomegaly. 4. Partially imaged punctate radiodensity in the region of the left anterior descending coronary artery, which may represent atherosclerotic calcification.   These results will be called to the ordering clinician or representative by the Radiologist Assistant, and communication documented in the PACS or [employer].   Electronically Signed: By: Limin  Xu M.D. On: 06/18/2024 14:10      Assessment:      Abdominal pain with CT findings as above.  Plan:     Even considering a noncontrast study, the appendix is very thin with abundance of intraluminal air, with the uppermost tip being the area of possible inflammation which is not the area where the patient has reported the most tenderness to palpation for the past week.  Labs also remain unremarkable, including urinalysis despite patient complaint of dysuria and exacerbation of suprapubic and periumbilical pain with each urinary episode.  Overall picture is somewhat confusing but not enough to consider proceeding with major abdominal surgery, especially with her  history of hysterectomy in the past which may make scar tissue formation a concern as well.  Discussed overnight observation with no antibiotics to see if this will potentially declare itself versus proceeding straight to surgery based on symptom severity and patient agreeable to proceed with observation at this point.  Will admit for pain control, clear liquid diet till midnight, no antibiotics and will repeat labs in the AM.  Serial abdominal exams in the meantime as well.  labs/images/medications/previous chart entries reviewed personally and relevant changes/updates noted above.

## 2024-06-18 NOTE — ED Provider Triage Note (Signed)
 Emergency Medicine Provider Triage Evaluation Note  Andrea [name] , a 44 y.o. female  was evaluated in triage.  Pt complains of right flank and right lower quadrant pain with nausea. She had a CT today and was advised to come to the ER.  Physical Exam  BP (!) 121/93 (BP Location: Left Arm)   Pulse 92   Temp 98.1 F (36.7 C) (Oral)   Resp 18   LMP  (LMP Unknown)   SpO2 98%  Gen:   Awake, no distress   Resp:  Normal effort  MSK:   Moves extremities without difficulty  Other:    Medical Decision Making  Medically screening exam initiated at 5:49 PM.  Appropriate orders placed.  Andrea [name] was informed that the remainder of the evaluation will be completed by another provider, this initial triage assessment does not replace that evaluation, and the importance of remaining in the ED until their evaluation is complete.   Herlinda Kirk NOVAK, FNP 06/18/24 1754

## 2024-06-19 ENCOUNTER — Other Ambulatory Visit: Payer: Self-pay

## 2024-06-19 ENCOUNTER — Encounter: Admission: EM | Disposition: A | Payer: Self-pay | Source: Home / Self Care

## 2024-06-19 ENCOUNTER — Observation Stay: Payer: MEDICAID | Admitting: Anesthesiology

## 2024-06-19 HISTORY — PX: XI ROBOTIC LAPAROSCOPIC ASSISTED APPENDECTOMY: SHX6877

## 2024-06-19 LAB — GLUCOSE, CAPILLARY
Glucose-Capillary: 110 mg/dL — ABNORMAL HIGH (ref 70–99)
Glucose-Capillary: 114 mg/dL — ABNORMAL HIGH (ref 70–99)
Glucose-Capillary: 186 mg/dL — ABNORMAL HIGH (ref 70–99)
Glucose-Capillary: 169 mg/dL — ABNORMAL HIGH (ref 70–99)
Glucose-Capillary: 196 mg/dL — ABNORMAL HIGH (ref 70–99)

## 2024-06-19 LAB — BASIC METABOLIC PANEL WITH GFR
Anion gap: 9 (ref 5–15)
BUN: 20 mg/dL (ref 6–20)
CO2: 27 mmol/L (ref 22–32)
Calcium: 9 mg/dL (ref 8.9–10.3)
Chloride: 107 mmol/L (ref 98–111)
Creatinine, Ser: 0.77 mg/dL (ref 0.44–1.00)
GFR, Estimated: >60 mL/min
Glucose, Bld: 98 mg/dL (ref 70–99)
Potassium: 3.6 mmol/L (ref 3.5–5.1)
Sodium: 143 mmol/L (ref 135–145)

## 2024-06-19 LAB — CBC
HCT: 39.6 % (ref 36.0–46.0)
Hemoglobin: 12.1 g/dL (ref 12.0–15.0)
MCH: 28.7 pg (ref 26.0–34.0)
MCHC: 30.6 g/dL (ref 30.0–36.0)
MCV: 94.1 fL (ref 80.0–100.0)
Platelets: 200 K/uL (ref 150–400)
RBC: 4.21 MIL/uL (ref 3.87–5.11)
RDW: 12.2 % (ref 11.5–15.5)
WBC: 6 K/uL (ref 4.0–10.5)
nRBC: 0 % (ref 0.0–0.2)

## 2024-06-19 LAB — HIV ANTIBODY (ROUTINE TESTING W REFLEX): HIV Screen 4th Generation wRfx: NONREACTIVE

## 2024-06-19 MED ORDER — OXYCODONE HCL 5 MG PO TABS: 5.0000 mg | ORAL_TABLET | Freq: Once | ORAL | Status: DC | PRN

## 2024-06-19 MED ORDER — ROCURONIUM BROMIDE 100 MG/10ML IV SOLN
INTRAVENOUS | Status: DC | PRN
  Administered 2024-06-19: 20 mg via INTRAVENOUS
  Administered 2024-06-19: 10 mg via INTRAVENOUS
  Administered 2024-06-19: 40 mg via INTRAVENOUS

## 2024-06-19 MED ORDER — IBUPROFEN 400 MG PO TABS
400.0000 mg | ORAL_TABLET | Freq: Three times a day (TID) | ORAL | 0 refills | Status: DC | PRN
  Filled 2024-06-19: qty 30, 10d supply, fill #0

## 2024-06-19 MED ORDER — TRAMADOL HCL 50 MG PO TABS
50.0000 mg | ORAL_TABLET | Freq: Three times a day (TID) | ORAL | 0 refills | Status: DC | PRN
  Filled 2024-06-19: qty 6, 2d supply, fill #0

## 2024-06-19 MED ORDER — ACETAMINOPHEN 10 MG/ML IV SOLN
INTRAVENOUS | Status: DC | PRN
  Administered 2024-06-19: 1000 mg via INTRAVENOUS

## 2024-06-19 MED ORDER — FENTANYL CITRATE (PF) 100 MCG/2ML IJ SOLN
INTRAMUSCULAR | Status: DC | PRN
  Administered 2024-06-19 (×2): 50 ug via INTRAVENOUS

## 2024-06-19 MED ORDER — DEXTROSE 5 % IV SOLN
INTRAVENOUS | Status: DC | PRN
  Administered 2024-06-19: 3 g via INTRAVENOUS

## 2024-06-19 MED ORDER — ONDANSETRON HCL 4 MG/2ML IJ SOLN
INTRAMUSCULAR | Status: DC | PRN
  Administered 2024-06-19: 4 mg via INTRAVENOUS

## 2024-06-19 MED ORDER — FENTANYL CITRATE (PF) 100 MCG/2ML IJ SOLN
INTRAMUSCULAR | Status: AC
  Filled 2024-06-19: qty 2

## 2024-06-19 MED ORDER — DOCUSATE SODIUM 100 MG PO CAPS
100.0000 mg | ORAL_CAPSULE | Freq: Two times a day (BID) | ORAL | 0 refills | Status: DC | PRN
  Filled 2024-06-19: qty 20, 10d supply, fill #0

## 2024-06-19 MED ORDER — MIDAZOLAM HCL (PF) 2 MG/2ML IJ SOLN
INTRAMUSCULAR | Status: DC | PRN
  Administered 2024-06-19 (×2): 1 mg via INTRAVENOUS

## 2024-06-19 MED ORDER — ACETAMINOPHEN 10 MG/ML IV SOLN: 1000.0000 mg | Freq: Once | INTRAVENOUS | Status: DC | PRN

## 2024-06-19 MED ORDER — DEXAMETHASONE SOD PHOSPHATE PF 10 MG/ML IJ SOLN
INTRAMUSCULAR | Status: DC | PRN
  Administered 2024-06-19: 10 mg via INTRAVENOUS

## 2024-06-19 MED ORDER — ACETAMINOPHEN 325 MG PO TABS
650.0000 mg | ORAL_TABLET | Freq: Three times a day (TID) | ORAL | 0 refills | Status: DC | PRN
  Filled 2024-06-19: qty 40, 7d supply, fill #0

## 2024-06-19 MED ORDER — FENTANYL CITRATE (PF) 100 MCG/2ML IJ SOLN: 25.0000 ug | INTRAMUSCULAR | Status: DC | PRN

## 2024-06-19 MED ORDER — DROPERIDOL 2.5 MG/ML IJ SOLN: 0.6250 mg | Freq: Once | INTRAMUSCULAR | Status: DC | PRN

## 2024-06-19 MED ORDER — 0.9 % SODIUM CHLORIDE (POUR BTL) OPTIME
TOPICAL | Status: DC | PRN
  Administered 2024-06-19: 500 mL

## 2024-06-19 MED ORDER — DEXMEDETOMIDINE HCL IN NACL 80 MCG/20ML IV SOLN
INTRAVENOUS | Status: DC | PRN
  Administered 2024-06-19 (×2): 8 ug via INTRAVENOUS

## 2024-06-19 MED ORDER — PROPOFOL 10 MG/ML IV BOLUS
INTRAVENOUS | Status: DC | PRN
  Administered 2024-06-19: 170 mg via INTRAVENOUS

## 2024-06-19 MED ORDER — ACETAMINOPHEN 10 MG/ML IV SOLN
INTRAVENOUS | Status: AC
  Filled 2024-06-19: qty 100

## 2024-06-19 MED ORDER — LIDOCAINE HCL (CARDIAC) PF 100 MG/5ML IV SOSY
PREFILLED_SYRINGE | INTRAVENOUS | Status: DC | PRN
  Administered 2024-06-19: 50 mg via INTRAVENOUS

## 2024-06-19 MED ORDER — BUPIVACAINE-EPINEPHRINE (PF) 0.5% -1:200000 IJ SOLN
INTRAMUSCULAR | Status: AC
  Filled 2024-06-19: qty 30

## 2024-06-19 MED ORDER — BUPIVACAINE-EPINEPHRINE (PF) 0.5% -1:200000 IJ SOLN
INTRAMUSCULAR | Status: DC | PRN
  Administered 2024-06-19: 30 mL

## 2024-06-19 MED ORDER — SUCCINYLCHOLINE CHLORIDE 200 MG/10ML IV SOSY
PREFILLED_SYRINGE | INTRAVENOUS | Status: DC | PRN
  Administered 2024-06-19: 140 mg via INTRAVENOUS

## 2024-06-19 MED ORDER — GLYCOPYRROLATE 0.2 MG/ML IJ SOLN
INTRAMUSCULAR | Status: DC | PRN
  Administered 2024-06-19: .2 mg via INTRAVENOUS

## 2024-06-19 MED ORDER — MIDAZOLAM HCL 2 MG/2ML IJ SOLN
INTRAMUSCULAR | Status: AC
  Filled 2024-06-19: qty 2

## 2024-06-19 MED ORDER — SODIUM CHLORIDE 0.9 % IR SOLN
Status: DC | PRN
  Administered 2024-06-19: 200 mL

## 2024-06-19 MED ORDER — OXYCODONE HCL 5 MG/5ML PO SOLN: 5.0000 mg | Freq: Once | ORAL | Status: DC | PRN

## 2024-06-19 MED ORDER — SUGAMMADEX SODIUM 200 MG/2ML IV SOLN
INTRAVENOUS | Status: DC | PRN
  Administered 2024-06-19: 254 mg via INTRAVENOUS

## 2024-06-19 NOTE — Plan of Care (Signed)
" °  Problem: Coping: Goal: Ability to adjust to condition or change in health will improve Outcome: Progressing   Problem: Fluid Volume: Goal: Ability to maintain a balanced intake and output will improve Outcome: Progressing   Problem: Metabolic: Goal: Ability to maintain appropriate glucose levels will improve Outcome: Progressing   Problem: Skin Integrity: Goal: Risk for impaired skin integrity will decrease Outcome: Progressing   Problem: Tissue Perfusion: Goal: Adequacy of tissue perfusion will improve Outcome: Progressing   Problem: Clinical Measurements: Goal: Ability to maintain clinical measurements within normal limits will improve Outcome: Progressing   Problem: Safety: Goal: Ability to remain free from injury will improve Outcome: Progressing   "

## 2024-06-19 NOTE — Anesthesia Procedure Notes (Signed)
 Procedure Name: Intubation Date/Time: 06/19/2024 11:05 AM  Performed by: Tod Handing, CRNAPre-anesthesia Checklist: Patient identified, Patient being monitored, Timeout performed, Emergency Drugs available and Suction available Patient Re-evaluated:Patient Re-evaluated prior to induction Oxygen Delivery Method: Circle system utilized Preoxygenation: Pre-oxygenation with 100% oxygen Induction Type: IV induction Ventilation: Mask ventilation without difficulty Laryngoscope Size: 3 and McGrath Grade View: Grade III Tube type: Oral Tube size: 7.0 mm Number of attempts: 1 Airway Equipment and Method: Stylet Placement Confirmation: ETT inserted through vocal cords under direct vision, positive ETCO2 and breath sounds checked- equal and bilateral Secured at: 22 cm Tube secured with: Tape Dental Injury: Teeth and Oropharynx as per pre-operative assessment  Difficulty Due To: Difficult Airway- due to large tongue Comments: Easy atraumatic with 1 pass and Mcgrath but large tongue.

## 2024-06-19 NOTE — Progress Notes (Signed)
 Subjective:  CC: Andrea [name] is a 44 y.o. female  Hospital stay day 0,   right lower quadrant pain  HPI: No changes in symptoms.  Patient states pain remains the same with persistent nausea vomiting as well  ROS:  Pertinent positives and negatives noted in the HPI.    Objective:   Temp:  [97.5 F (36.4 C)-98.7 F (37.1 C)] 98.3 F (36.8 C) (01/03 0758) Pulse Rate:  [85-103] 86 (01/03 0758) Resp:  [16-18] 16 (01/03 0758) BP: (114-135)/(62-94) 135/94 (01/03 0758) SpO2:  [93 %-98 %] 94 % (01/03 0758) Weight:  [872 kg] 127 kg (01/02 1752)     Height: 5' 11 (180.3 cm) Weight: 127 kg BMI (Calculated): 39.07   Intake/Output this shift:   Intake/Output Summary (Last 24 hours) at 06/19/2024 0817 Last data filed at 06/19/2024 9355 Gross per 24 hour  Intake 549.85 ml  Output --  Net 549.85 ml    Constitutional :  alert, cooperative, appears stated age, and no distress  Respiratory:  Clear to auscultation bilaterally  Cardiovascular:  Regular rate and rhythm  Gastrointestinal: Soft, no guarding, tenderness to palpation periumbilical right lower quadrant and right upper quadrant again.   Skin: Cool and moist.   Psychiatric: Normal affect, non-agitated, not confused       LABS:     Latest Ref Rng & Units 06/19/2024    5:48 AM 06/18/2024    5:50 PM 06/15/2024    8:38 AM  CMP  Glucose 70 - 99 mg/dL 98  878  867   BUN 6 - 20 mg/dL 20  21  20    Creatinine 0.44 - 1.00 mg/dL 9.22  9.23  9.14   Sodium 135 - 145 mmol/L 143  143  143   Potassium 3.5 - 5.1 mmol/L 3.6  3.5  3.8   Chloride 98 - 111 mmol/L 107  105  107   CO2 22 - 32 mmol/L 27  25  28    Calcium 8.9 - 10.3 mg/dL 9.0  9.0  9.3   Total Protein 6.0 - 8.3 g/dL   6.4   Total Bilirubin 0.2 - 1.2 mg/dL   0.3   Alkaline Phos 39 - 117 U/L   108   AST 5 - 37 U/L   12   ALT 3 - 35 U/L   18       Latest Ref Rng & Units 06/19/2024    5:48 AM 06/18/2024    5:50 PM 06/15/2024    8:38 AM  CBC  WBC 4.0 - 10.5 K/uL 6.0  6.0  7.1    Hemoglobin 12.0 - 15.0 g/dL 87.8  86.9  87.2   Hematocrit 36.0 - 46.0 % 39.6  41.9  39.4   Platelets 150 - 400 K/uL 200  213  215.0     RADS: N/a Assessment:   Abdominal pain No change in symptomatology nor labs.  Again had the discussion between proceeding with appendectomy to see if any patient improvement despite lack of objective findings of obvious appendicitis versus continued monitoring.  Patient at this point is tired of dealing with the pain for the past week, requested to proceed with surgery understanding the risks involved below as well as no guarantee that her symptomatology will change at all.  Discussed the risk of surgery including post-op infxn, seroma, hematoma, abscess formation, chronic pain, poor-delayed wound healing, possible bowel resection, possible ostomy, possible conversion to open procedure, post-op SBO or ileus, and need for additional procedures to  address said risks.  The risks of general anesthetic including MI, CVA, sudden death or even reaction to anesthetic medications also discussed. Alternatives include continued observation, or antibiotic treatment.  Benefits include possible symptom relief,   Typical post operative recovery of 3-5 days rest, also discussed.  To OR for robotic assisted laparoscopic appendectomy.  The patient understands the risks, any and all questions were answered to the patient's satisfaction.  labs/images/medications/previous chart entries reviewed personally and relevant changes/updates noted above.

## 2024-06-19 NOTE — Plan of Care (Signed)

## 2024-06-19 NOTE — Progress Notes (Signed)
 Patient is currently off the unit. She is in OR for robotic assisted lap. Appendectomy .    Delphine Bring, RN

## 2024-06-19 NOTE — Discharge Instructions (Signed)
 Laparoscopic Appendectomy, Care After This sheet gives you information about how to care for yourself after your procedure. Your doctor may also give you more specific instructions. If you have problems or questions, contact your doctor. Follow these instructions at home: Care for cuts from surgery (incisions)  Follow instructions from your doctor about how to take care of your cuts from surgery. Make sure you: Wash your hands with soap and water before you change your bandage (dressing). If you cannot use soap and water, use hand sanitizer. Change your bandage as told by your doctor. Leave stitches (sutures), skin glue, or skin tape (adhesive) strips in place. They may need to stay in place for 2 weeks or longer. If tape strips get loose and curl up, you may trim the loose edges. Do not remove tape strips completely unless your doctor says it is okay. Do not take baths, swim, or use a hot tub until your doctor says it is okay. OK TO SHOWER 24HRS AFTER YOUR SURGERY.  Check your surgical cut area every day for signs of infection. Check for: More redness, swelling, or pain. More fluid or blood. Warmth. Pus or a bad smell. Activity Do not drive or use heavy machinery while taking prescription pain medicine. Do not play contact sports until your doctor says it is okay. Do not drive for 24 hours if you were given a medicine to help you relax (sedative). Rest as needed. Do not return to work or school until your doctor says it is okay. General instructions  tylenol and advil as needed for discomfort.  Please alternate between the two every four hours as needed for pain.    Use narcotics, if prescribed, only when tylenol and motrin is not enough to control pain.  325-650mg  every 8hrs to max of 3000mg /24hrs (including the 325mg  in every norco dose) for the tylenol.    Advil up to 800mg  per dose every 8hrs as needed for pain.   To prevent or treat constipation while you are taking prescription pain  medicine, your doctor may recommend that you: Drink enough fluid to keep your pee (urine) clear or pale yellow. Take over-the-counter or prescription medicines. Eat foods that are high in fiber, such as fresh fruits and vegetables, whole grains, and beans. Limit foods that are high in fat and processed sugars, such as fried and sweet foods. Contact a doctor if: You develop a rash. You have more redness, swelling, or pain around your surgical cuts. You have more fluid or blood coming from your surgical cuts. Your surgical cuts feel warm to the touch. You have pus or a bad smell coming from your surgical cuts. You have a fever. One or more of your surgical cuts breaks open. Get help right away if: You have trouble breathing. You have chest pain. You faint or feel dizzy when you stand. You have leg pain. This information is not intended to replace advice given to you by your health care provider. Make sure you discuss any questions you have with your health care provider. Document Released: 03/12/2008 Document Revised: 12/23/2015 Document Reviewed: 11/20/2015 Elsevier Interactive Patient Education  2019 [employer].

## 2024-06-19 NOTE — Anesthesia Postprocedure Evaluation (Signed)
"   Anesthesia Post Note  Patient: Andrea [name]  Procedure(s) Performed: APPENDECTOMY, ROBOT-ASSISTED, LAPAROSCOPIC (Abdomen)  Patient location during evaluation: PACU Anesthesia Type: General Level of consciousness: awake and alert Pain management: pain level controlled Vital Signs Assessment: post-procedure vital signs reviewed and stable Respiratory status: spontaneous breathing, nonlabored ventilation, respiratory function stable and patient connected to nasal cannula oxygen Cardiovascular status: blood pressure returned to baseline and stable Postop Assessment: no apparent nausea or vomiting Anesthetic complications: no   No notable events documented.   Last Vitals:  Vitals:   06/19/24 1545 06/19/24 1932  BP: (!) 132/92 (!) 153/94  Pulse: 88 97  Resp: 16 17  Temp: (!) 38.2 C 36.5 C  SpO2: 92% 91%    Last Pain:  Vitals:   06/19/24 2242  TempSrc:   PainSc: 9                  Lynwood KANDICE Clause      "

## 2024-06-19 NOTE — Anesthesia Preprocedure Evaluation (Addendum)
 "                                  Anesthesia Evaluation  Patient identified by MRN, date of birth, ID band Patient awake    Reviewed: Allergy & Precautions, H&P , NPO status , Patient's Chart, lab work & pertinent test results, reviewed documented beta blocker date and time   History of Anesthesia Complications (+) PONV and history of anesthetic complications  Airway Mallampati: II  TM Distance: >3 FB Neck ROM: full    Dental  (+) Teeth Intact   Pulmonary shortness of breath and with exertion, asthma    Pulmonary exam normal        Cardiovascular Exercise Tolerance: Good hypertension, On Medications Normal cardiovascular exam Rhythm:regular Rate:Normal     Neuro/Psych  Headaches, Seizures -,  PSYCHIATRIC DISORDERS Anxiety Depression Bipolar Disorder Schizophrenia   Neuromuscular disease    GI/Hepatic Neg liver ROS,GERD  Medicated,,  Endo/Other  diabetes  Class 3 obesity  Renal/GU Renal disease  negative genitourinary   Musculoskeletal   Abdominal   Peds  Hematology  (+) Blood dyscrasia, anemia   Anesthesia Other Findings Past Medical History: No date: Allergy No date: Anemia No date: Anxiety No date: Arthritis No date: Asthma No date: Bipolar 1 disorder (HCC) 12/20/2016: Bipolar 1 disorder, mixed, severe (HCC) 12/20/2016: Bipolar I disorder, most recent episode depressed (HCC) No date: Complication of anesthesia     Comment:  hard time waking up  No date: Depression No date: Diabetes mellitus without complication (HCC) 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/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 No date: GERD (gastroesophageal reflux disease)     Comment:  no meds No date: Hypertension No date: Kidney stones 03/13/2022: Long term current use of antipsychotic medication No date: Low back pain No date: Migraines No date: Neuromuscular disorder (HCC) No date: PCOS (polycystic ovarian syndrome) No date: PONV (postoperative nausea and vomiting) No date: Schizophrenia  (HCC) 10/03/2021: Seizure (HCC) 06/04/2016: Seizures (HCC)     Comment:  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 No date: Shortness of breath dyspnea     Comment:  with bronchitis Past Surgical History: 11/14/2015: ABDOMINAL HYSTERECTOMY; N/A     Comment:  Procedure: HYSTERECTOMY ABDOMINAL;  Surgeon: Oneil FORBES Piety, MD;  Location: [facility];  Service: Gynecology;                Laterality: N/A; 05/13/2023: ANKLE FUSION; Right     Comment:  Procedure: ARTHRODESIS ANKLE;  Surgeon: Malvin Marsa FALCON, DPM;  Location: [facility];  Service:               Orthopedics/Podiatry;  Laterality: Right;  Hardware               removal, right ankle tibiot talo calcaneal arthrodesis No date: CHOLECYSTECTOMY No date: FRACTURE SURGERY 06/14/2014: INTRAUTERINE DEVICE (IUD) INSERTION     Comment:  [facility] No date: LYMPH NODES REMOVED 05/03/2023: ORIF ANKLE FRACTURE; Right     Comment:  Procedure: OPEN REDUCTION INTERNAL FIXATION (ORIF) ANKLE              FRACTURE;  Surgeon: Silva Juliene SAUNDERS, DPM;  Location:               [facility];  Service: Orthopedics/Podiatry;  Laterality:  Right; No date: ovarian cyst removed 11/14/2015: SALPINGOOPHORECTOMY; Bilateral     Comment:  Procedure: SALPINGO OOPHORECTOMY;  Surgeon: Oneil FORBES Piety, MD;  Location: [facility];  Service: Gynecology;                Laterality: Bilateral; BMI    Body Mass Index: 39.05 kg/m     Reproductive/Obstetrics negative OB ROS                              Anesthesia Physical Anesthesia Plan  ASA: 3 and emergent  Anesthesia Plan: General ETT   Post-op Pain Management:    Induction:   PONV Risk Score and Plan:   Airway Management Planned:   Additional Equipment:   Intra-op Plan:   Post-operative Plan:   Informed Consent: I have reviewed the patients History and Physical, chart, labs and discussed the procedure  including the risks, benefits and alternatives for the proposed anesthesia with the patient or authorized representative who has indicated his/her understanding and acceptance.     Dental Advisory Given  Plan Discussed with: CRNA  Anesthesia Plan Comments:          Anesthesia Quick Evaluation  "

## 2024-06-19 NOTE — Transfer of Care (Signed)
 Immediate Anesthesia Transfer of Care Note  Patient: Andrea [name]  Procedure(s) Performed: Procedures: APPENDECTOMY, ROBOT-ASSISTED, LAPAROSCOPIC (N/A)  Patient Location: PACU  Anesthesia Type:General  Level of Consciousness: sedated  Airway & Oxygen Therapy: Patient Spontanous Breathing and Patient connected to face mask oxygen  Post-op Assessment: Report given to RN and Post -op Vital signs reviewed and stable  Post vital signs: Reviewed and stable  Last Vitals:  Vitals:   06/19/24 0758 06/19/24 1237  BP: (!) 135/94 (!) 141/79  Pulse: 86 91  Resp: 16 (!) 28  Temp: 36.8 C 36.7 C  SpO2: 94% 98%    Complications: No apparent anesthesia complications

## 2024-06-19 NOTE — Op Note (Signed)
 Preoperative diagnosis: acute appendicitis  Postoperative diagnosis: Same  Procedure: Robotic assisted laparoscopic appendectomy.  Anesthesia: GETA  Surgeon: Henriette Pierre  Wound Classification: clean contaminated  Specimen: Appendix  Complications: None  Estimated Blood Loss: 3 mL   Indications: Patient is a 44 y.o. female  presented with above.  Please see H&P for further details.    FIndings: 1.  Irritated appendix  2. No peri-appendiceal abscess or phlegmon 3. Normal anatomy 4. Appendiceal artery ligated and divided with stapler 5. Adequate hemostasis.   Description of procedure: The patient was placed on the operating table in the supine position, left arm tucked. General anesthesia was induced. A time-out was completed verifying correct patient, procedure, site, positioning, and implant(s) and/or special equipment prior to beginning this procedure. The abdomen was prepped and draped in the usual sterile fashion.   Palmer's point located and Veress needle was inserted.  After confirming 2 clicks and a positive saline drop test, gas insufflation was initiated until the abdominal pressure was measured at 15 mmHg. Afterwards, the Veress needle was removed and 5 mm port placed in the same area via Optiview technique.   No injury noted during placement and no adhesions noted within the abdominal cavity.    A 8 mm port was placed through a periumbilical site under direct visualization after incision with an 11 blade.  After local was infused, 2 additional incision was made 8 cm apart each side along the left side of the abdominal wall from the initial incision.  An 8 mm port caudad and 12mm port cephalad from initial incision, both under direct visualization.  No injuries from trocar placements were noted. The table was placed in the Trendelenburg position with the right side elevated.  Xi robotic platform was then brought to the operative field and docked.  Extremely elongated  appendix pointing towards the former gallbladder fossa was identified after additional retraction of the ascending colon towards the midline.  An inflamed appendix was identified and noted to be extensively scarred down to the posterior abdominal wall along with his mesoappendix..  Infection was present within the abdominal cavity due to appendicitis.  Vessel sealer then used to meticulously separate the mesoappendix and appendix of the posterior abdominal wall and adjacent colon mesentery without injury to the colon mesentery or the colon itself.  Once the appendix was completely isolated from the surrounding tissue, A blue load linear cutting stapler was then used to divide and staple the base of the appendix.  Bleeding from the staple line controlled with electrocautery.  The appendix removed through the 12 mm port. The appendiceal stump and mesoappendix  examined again and hemostasis noted.  However, due to the atypical anatomy as well as some difficulty maintaining adequate visualization throughout the entire procedure secondary to patient body habitus, 15 French round drain placed along the dissected area and secured to skin in the left lower quadrant port site using 3-0 nylon.    No other pathology was identified within pelvis. The 12 mm trocar removed and port site closed with efx shield using 0 vicryl under direct vision. Remaining trocars were removed under direct vision. No bleeding was noted.The abdomen was allowed to collapse. All skin incisions then closed with subcuticular sutures Monocryl 4-0.  Wounds then dressed with dermabond.  The patient tolerated the procedure well, awakened from anesthesia and was taken to the postanesthesia care unit in satisfactory condition.  Sponge count and instrument count correct at the end of the procedure.

## 2024-06-20 ENCOUNTER — Encounter: Payer: Self-pay | Admitting: Surgery

## 2024-06-20 ENCOUNTER — Other Ambulatory Visit: Payer: Self-pay

## 2024-06-20 DIAGNOSIS — K3580 Unspecified acute appendicitis: Secondary | ICD-10-CM | POA: Diagnosis not present

## 2024-06-20 LAB — BASIC METABOLIC PANEL WITH GFR
Anion gap: 10 (ref 5–15)
BUN: 15 mg/dL (ref 6–20)
CO2: 28 mmol/L (ref 22–32)
Calcium: 9 mg/dL (ref 8.9–10.3)
Chloride: 105 mmol/L (ref 98–111)
Creatinine, Ser: 0.65 mg/dL (ref 0.44–1.00)
GFR, Estimated: >60 mL/min
Glucose, Bld: 137 mg/dL — ABNORMAL HIGH (ref 70–99)
Potassium: 4 mmol/L (ref 3.5–5.1)
Sodium: 143 mmol/L (ref 135–145)

## 2024-06-20 LAB — CBC
HCT: 39.5 % (ref 36.0–46.0)
Hemoglobin: 12.4 g/dL (ref 12.0–15.0)
MCH: 28.4 pg (ref 26.0–34.0)
MCHC: 31.4 g/dL (ref 30.0–36.0)
MCV: 90.6 fL (ref 80.0–100.0)
Platelets: 243 K/uL (ref 150–400)
RBC: 4.36 MIL/uL (ref 3.87–5.11)
RDW: 12.3 % (ref 11.5–15.5)
WBC: 8.3 K/uL (ref 4.0–10.5)
nRBC: 0 % (ref 0.0–0.2)

## 2024-06-20 LAB — GLUCOSE, CAPILLARY
Glucose-Capillary: 133 mg/dL — ABNORMAL HIGH (ref 70–99)
Glucose-Capillary: 208 mg/dL — ABNORMAL HIGH (ref 70–99)

## 2024-06-20 NOTE — Plan of Care (Signed)

## 2024-06-20 NOTE — Discharge Summary (Signed)
 Patient ID: Andrea [name] MRN: [id number] DOB/AGE: Sep 18, 1980 44 y.o.  Admit date: 06/18/2024 Discharge date: 06/20/2024   Discharge Diagnoses:  Principal Problem:   Acute appendicitis Active Problems:   Right lower quadrant abdominal pain   Procedures:  Robotic assisted appendectomy  Hospital Course:  Patient was admitted on 06/18/24 with acute appendicitis and taken to the OR on 06/19/24 for a robotic appendectomy.  She had a drain placed intra-op.  Post-op, the patient has been doing well.  Her pain is controlled with medications.  She is tolerating a diet, voiding, ambulating.  Incisions are clean, dry, intact.  Drain in place with serosanguinous fluid.  OK to d/c home today.  Consults: None  Disposition: Discharge disposition: 01-Home or Self Care       Discharge Instructions     Call MD for:  difficulty breathing, headache or visual disturbances   Complete by: As directed    Call MD for:  persistant nausea and vomiting   Complete by: As directed    Call MD for:  redness, tenderness, or signs of infection (pain, swelling, redness, odor or green/yellow discharge around incision site)   Complete by: As directed    Call MD for:  severe uncontrolled pain   Complete by: As directed    Call MD for:  temperature >100.4   Complete by: As directed    Diet Carb Modified   Complete by: As directed    Discharge instructions   Complete by: As directed    1.  Patient may shower, but do not scrub wounds heavily and dab dry only. 2.  Do not submerge wounds in pool/tub until fully healed. 3.  Do not apply ointments or hydrogen peroxide  to the wounds. 4.  May apply ice packs to the wounds for comfort. 5.  Please empty and recharge drain twice daily and as needed to keep the bulb only half full.   6.  Change drain dressing with gauze/tape once daily and as needed.   Driving Restrictions   Complete by: As directed    Do not drive while taking narcotics for pain control.  Prior to  driving, make sure you are able to rotate right and left to look at blindspots without significant pain or discomfort.   Increase activity slowly   Complete by: As directed    Lifting restrictions   Complete by: As directed    No heavy lifting or pushing of more than 10-15 lbs for 4 weeks.      Allergies as of 06/20/2024       Reactions   Bactrim [sulfamethoxazole -trimethoprim] Hives, Itching   Codeine Hives, Itching   Sulfa Antibiotics Itching        Medication List     TAKE these medications    acetaminophen  325 MG tablet Commonly known as: Tylenol  Take 2 tablets (650 mg total) by mouth every 8 (eight) hours as needed for mild pain (pain score 1-3).   Airsupra 90-80 MCG/ACT Aero Generic drug: Albuterol -Budesonide    buPROPion  150 MG 24 hr tablet Commonly known as: WELLBUTRIN  XL Take 1 tablet (150 mg total) by mouth daily.   busPIRone  10 MG tablet Commonly known as: BUSPAR  Take 1 tablet (10 mg total) by mouth 3 (three) times daily.   cetirizine  10 MG tablet Commonly known as: ZYRTEC  Take 10 mg by mouth at bedtime.   clonazePAM  0.5 MG tablet Commonly known as: KlonoPIN  Take 1 tablet (0.5 mg total) by mouth 3 (three) times daily as needed for anxiety (  sleep).   cyclobenzaprine  5 MG tablet Commonly known as: FLEXERIL  Take 1 tablet (5 mg total) by mouth 3 (three) times daily as needed.   docusate sodium  100 MG capsule Commonly known as: Colace Take 1 capsule (100 mg total) by mouth 2 (two) times daily as needed for up to 10 days for mild constipation.   DULoxetine  60 MG capsule Commonly known as: CYMBALTA  Take 2 capsules (120 mg total) by mouth daily.   eletriptan  40 MG tablet Commonly known as: Relpax  Take 1 tablet at onset of migraine. May repeat in 2 hours if headache persists or recurs. Do not take more than 3 a week   empagliflozin  25 MG Tabs tablet Commonly known as: JARDIANCE  Take 25 mg by mouth.   famotidine  40 MG tablet Commonly known as:  PEPCID  Take 1 tablet (40 mg total) by mouth 2 (two) times daily.   gabapentin  600 MG tablet Commonly known as: NEURONTIN  Take 1 tablet (600 mg total) by mouth 3 (three) times daily.   hydrOXYzine  50 MG capsule Commonly known as: VISTARIL  Take 1 capsule (50 mg total) by mouth every 4 (four) hours as needed for anxiety.   ibuprofen  400 MG tablet Commonly known as: ADVIL  Take 1 tablet (400 mg total) by mouth every 8 (eight) hours as needed for mild pain (pain score 1-3) or moderate pain (pain score 4-6).   mirabegron  ER 25 MG Tb24 tablet Commonly known as: MYRBETRIQ  Take 25 mg by mouth daily.   montelukast  10 MG tablet Commonly known as: SINGULAIR  TAKE 1 TABLET BY MOUTH EVERYDAY AT BEDTIME   naloxone  4 MG/0.1ML Liqd nasal spray kit Commonly known as: NARCAN  Place 1 spray into the nose as needed for up to 365 doses (for opioid-induced respiratory depresssion). In case of emergency (overdose), spray once into each nostril. If no response within 3 minutes, repeat application and call 911.   ondansetron  4 MG disintegrating tablet Commonly known as: ZOFRAN -ODT Take 2 tablets (8 mg total) by mouth every 8 (eight) hours as needed for nausea or vomiting.   pantoprazole  40 MG tablet Commonly known as: PROTONIX  TAKE 1 TABLET BY MOUTH EVERY DAY IN THE MORNING   potassium chloride  10 MEQ CR capsule Commonly known as: MICRO-K  TAKE 1 CAPSULE BY MOUTH EVERY DAY   promethazine  25 MG tablet Commonly known as: PHENERGAN  Take 1 tablet (25 mg total) by mouth every 8 (eight) hours as needed for nausea or vomiting.   QUEtiapine  100 MG tablet Commonly known as: SEROQUEL  Take 100 mg by mouth 2 (two) times daily. Take 100 mg by mouth in AM and 200 mg by mouth in PM What changed: when to take this   Qulipta  60 MG Tabs Generic drug: Atogepant  Take 1 tablet daily   Symbicort 160-4.5 MCG/ACT inhaler Generic drug: budesonide -formoterol  Inhale 2 puffs into the lungs.   tamsulosin  0.4 MG Caps  capsule Commonly known as: FLOMAX  Take 1 capsule (0.4 mg total) by mouth daily.   traMADol  50 MG tablet Commonly known as: Ultram  Take 1 tablet (50 mg total) by mouth every 8 (eight) hours as needed.   traZODone  150 MG tablet Commonly known as: DESYREL  TAKE 1 TABLET BY MOUTH AT BEDTIME.   zonisamide  100 MG capsule Commonly known as: ZONEGRAN  Take 5 capsules every night        Follow-up Information     Lester, Kacy, NP Follow up.   Specialty: Nurse Practitioner Why: hospital follow up Contact information: [address] [place] [address] [phone]  Sakai, Isami, DO Follow up in 1 week(s).   Specialties: General Surgery, Surgery Why: Postop appendectomy Contact information: [address] [place] [address] [phone]                 I spent 35 minutes dedicated to the care of this patient on the date of this encounter to include pre-visit review of records, face-to-face time with the patient discussing diagnosis and management, and any post-visit coordination of care.  Aloysius Plant, MD

## 2024-06-20 NOTE — Plan of Care (Signed)
 " Problem: Education: Goal: Ability to describe self-care measures that may prevent or decrease complications (Diabetes Survival Skills Education) will improve Outcome: Adequate for Discharge Goal: Individualized Educational Video(s) Outcome: Adequate for Discharge   Problem: Coping: Goal: Ability to adjust to condition or change in health will improve Outcome: Adequate for Discharge   Problem: Fluid Volume: Goal: Ability to maintain a balanced intake and output will improve Outcome: Adequate for Discharge   Problem: Health Behavior/Discharge Planning: Goal: Ability to identify and utilize available resources and services will improve Outcome: Adequate for Discharge Goal: Ability to manage health-related needs will improve Outcome: Adequate for Discharge   Problem: Metabolic: Goal: Ability to maintain appropriate glucose levels will improve Outcome: Adequate for Discharge   Problem: Nutritional: Goal: Maintenance of adequate nutrition will improve Outcome: Adequate for Discharge Goal: Progress toward achieving an optimal weight will improve Outcome: Adequate for Discharge   Problem: Skin Integrity: Goal: Risk for impaired skin integrity will decrease Outcome: Adequate for Discharge   Problem: Tissue Perfusion: Goal: Adequacy of tissue perfusion will improve Outcome: Adequate for Discharge   Problem: Education: Goal: Knowledge of General Education information will improve Description: Including pain rating scale, medication(s)/side effects and non-pharmacologic comfort measures Outcome: Adequate for Discharge   Problem: Health Behavior/Discharge Planning: Goal: Ability to manage health-related needs will improve Outcome: Adequate for Discharge   Problem: Clinical Measurements: Goal: Ability to maintain clinical measurements within normal limits will improve Outcome: Adequate for Discharge Goal: Will remain free from infection Outcome: Adequate for Discharge Goal:  Diagnostic test results will improve Outcome: Adequate for Discharge Goal: Respiratory complications will improve Outcome: Adequate for Discharge Goal: Cardiovascular complication will be avoided Outcome: Adequate for Discharge   Problem: Activity: Goal: Risk for activity intolerance will decrease Outcome: Adequate for Discharge   Problem: Nutrition: Goal: Adequate nutrition will be maintained Outcome: Adequate for Discharge   Problem: Coping: Goal: Level of anxiety will decrease Outcome: Adequate for Discharge   Problem: Elimination: Goal: Will not experience complications related to bowel motility Outcome: Adequate for Discharge Goal: Will not experience complications related to urinary retention Outcome: Adequate for Discharge   Problem: Pain Managment: Goal: General experience of comfort will improve and/or be controlled Outcome: Adequate for Discharge   Problem: Safety: Goal: Ability to remain free from injury will improve Outcome: Adequate for Discharge   Problem: Skin Integrity: Goal: Risk for impaired skin integrity will decrease Outcome: Adequate for Discharge   Problem: Education: Goal: Knowledge of the prescribed therapeutic regimen will improve Outcome: Adequate for Discharge   Problem: Bowel/Gastric: Goal: Gastrointestinal status for postoperative course will improve Outcome: Adequate for Discharge   Problem: Cardiac: Goal: Ability to maintain an adequate cardiac output Outcome: Adequate for Discharge Goal: Will show no evidence of cardiac arrhythmias Outcome: Adequate for Discharge   Problem: Nutritional: Goal: Will attain and maintain optimal nutritional status Outcome: Adequate for Discharge   Problem: Neurological: Goal: Will regain or maintain usual level of consciousness Outcome: Adequate for Discharge   Problem: Clinical Measurements: Goal: Ability to maintain clinical measurements within normal limits Outcome: Adequate for  Discharge Goal: Postoperative complications will be avoided or minimized Outcome: Adequate for Discharge   Problem: Respiratory: Goal: Will regain and/or maintain adequate ventilation Outcome: Adequate for Discharge Goal: Respiratory status will improve Outcome: Adequate for Discharge   Problem: Skin Integrity: Goal: Demonstrates signs of wound healing without infection Outcome: Adequate for Discharge   Problem: Urinary Elimination: Goal: Will remain free from infection Outcome: Adequate for Discharge Goal: Ability to  achieve and maintain adequate urine output Outcome: Adequate for Discharge   "

## 2024-06-21 ENCOUNTER — Telehealth: Payer: Self-pay

## 2024-06-21 ENCOUNTER — Other Ambulatory Visit: Payer: Self-pay

## 2024-06-21 ENCOUNTER — Emergency Department
Admission: EM | Admit: 2024-06-21 | Discharge: 2024-06-22 | Disposition: A | Discharge status: Home / Self Care | Payer: MEDICAID | Attending: Emergency Medicine | Admitting: Emergency Medicine

## 2024-06-21 ENCOUNTER — Encounter: Payer: Self-pay | Admitting: *Deleted

## 2024-06-21 DIAGNOSIS — N3000 Acute cystitis without hematuria: Secondary | ICD-10-CM | POA: Diagnosis not present

## 2024-06-21 DIAGNOSIS — R350 Frequency of micturition: Secondary | ICD-10-CM | POA: Diagnosis present

## 2024-06-21 DIAGNOSIS — G8918 Other acute postprocedural pain: Secondary | ICD-10-CM

## 2024-06-21 LAB — URINALYSIS, ROUTINE W REFLEX MICROSCOPIC
Bilirubin Urine: NEGATIVE
Glucose, UA: 50 mg/dL — AB
Hgb urine dipstick: NEGATIVE
Ketones, ur: NEGATIVE mg/dL
Nitrite: NEGATIVE
Protein, ur: NEGATIVE mg/dL
Specific Gravity, Urine: 1.025 (ref 1.005–1.030)
pH: 5 (ref 5.0–8.0)

## 2024-06-21 LAB — CBC
HCT: 40.6 % (ref 36.0–46.0)
Hemoglobin: 12.5 g/dL (ref 12.0–15.0)
MCH: 28.9 pg (ref 26.0–34.0)
MCHC: 30.8 g/dL (ref 30.0–36.0)
MCV: 94 fL (ref 80.0–100.0)
Platelets: 221 K/uL (ref 150–400)
RBC: 4.32 MIL/uL (ref 3.87–5.11)
RDW: 12.2 % (ref 11.5–15.5)
WBC: 7.3 K/uL (ref 4.0–10.5)
nRBC: 0 % (ref 0.0–0.2)

## 2024-06-21 LAB — COMPREHENSIVE METABOLIC PANEL WITH GFR
ALT: 14 U/L (ref 0–44)
AST: 16 U/L (ref 15–41)
Albumin: 4.2 g/dL (ref 3.5–5.0)
Alkaline Phosphatase: 134 U/L — ABNORMAL HIGH (ref 38–126)
Anion gap: 12 (ref 5–15)
BUN: 22 mg/dL — ABNORMAL HIGH (ref 6–20)
CO2: 26 mmol/L (ref 22–32)
Calcium: 8.9 mg/dL (ref 8.9–10.3)
Chloride: 105 mmol/L (ref 98–111)
Creatinine, Ser: 0.76 mg/dL (ref 0.44–1.00)
GFR, Estimated: >60 mL/min
Glucose, Bld: 135 mg/dL — ABNORMAL HIGH (ref 70–99)
Potassium: 3.7 mmol/L (ref 3.5–5.1)
Sodium: 142 mmol/L (ref 135–145)
Total Bilirubin: 0.3 mg/dL (ref 0.0–1.2)
Total Protein: 6.4 g/dL — ABNORMAL LOW (ref 6.5–8.1)

## 2024-06-21 LAB — CULTURE, URINE COMPREHENSIVE

## 2024-06-21 LAB — LIPASE, BLOOD: Lipase: 48 U/L (ref 11–51)

## 2024-06-21 NOTE — ED Triage Notes (Signed)
 Pt endorses burning pain at her appendectomy incision sites (appendectomy on Saturday). Denies fevers or vomiting. Has had nausea. She has a JP drain and is concerned it is clotting off. 10ml drained in triage and JP drain re charged. No signs of drainage at incision sites.

## 2024-06-21 NOTE — Transitions of Care (Post Inpatient/ED Visit) (Signed)
" ° °  06/21/2024  Name: Andrea [name] MRN: [id number] DOB: 1980-11-27  Today's TOC FU Call Status: Today's TOC FU Call Status:: Unsuccessful Call (1st Attempt) Unsuccessful Call (1st Attempt) Date: 06/21/24  Attempted to reach the patient regarding the most recent Inpatient/ED visit.  Follow Up Plan: Additional outreach attempts will be made to reach the patient to complete the Transitions of Care (Post Inpatient/ED visit) call.   Signature Julian Lemmings, LPN [facility] Nurse Health Advisor Direct Dial [phone]  "

## 2024-06-22 ENCOUNTER — Telehealth: Payer: Self-pay

## 2024-06-22 LAB — SURGICAL PATHOLOGY

## 2024-06-22 MED ORDER — CEPHALEXIN 500 MG PO CAPS: 500.0000 mg | ORAL_CAPSULE | Freq: Three times a day (TID) | ORAL | 0 refills | Status: DC

## 2024-06-22 MED ORDER — OXYCODONE HCL 5 MG PO TABS: 5.0000 mg | ORAL_TABLET | Freq: Four times a day (QID) | ORAL | 0 refills | Status: AC | PRN

## 2024-06-22 MED ORDER — OXYCODONE HCL 5 MG PO TABS
5.0000 mg | ORAL_TABLET | Freq: Once | ORAL | Status: AC
  Administered 2024-06-22: 5 mg via ORAL
  Filled 2024-06-22: qty 1

## 2024-06-22 NOTE — Discharge Instructions (Addendum)
 Your workup was reassuring we discussed CT imaging but you have opted to decline if you develop fevers, worsening pain, nausea, vomiting, unable to have bowel movement please return to the ER.  Stop taking the tramadol  and use the oxycodone  instead.  Be careful using it with your clonazepam  and gabapentin  as this can all cause sedation.  Take MiraLAX  to prevent constipation  Call your surgeon to make a follow-up appointment and return to the ER for worsening symptoms or any other concern  Take oxycodone  as prescribed. Do not drink alcohol , drive or participate in any other potentially dangerous activities while taking this medication as it may make you sleepy. Do not take this medication with any other sedating medications, either prescription or over-the-counter. If you were prescribed Percocet or Vicodin, do not take these with acetaminophen  (Tylenol ) as it is already contained within these medications.  This medication is an opiate (or narcotic) pain medication and can be habit forming. Use it as little as possible to achieve adequate pain control. Do not use or use it with extreme caution if you have a history of opiate abuse or dependence. If you are on a pain contract with your primary care doctor or a pain specialist, be sure to let them know you were prescribed this medication today from the Emergency Department. This medication is intended for your use only - do not give any to anyone else and keep it in a secure place where nobody else, especially children, have access to it.

## 2024-06-22 NOTE — ED Provider Notes (Signed)
 "  [facility] Provider Note    Event Date/Time   First MD Initiated Contact with Patient 06/22/24 0011     (approximate)   History   Post-op Problem   HPI  Andrea [name] is a 44 y.o. female with recent right appendectomy on 06/19/2024.  I reviewed the note from the surgery.  Patient comes in with concerns for not draining.  Patient reports that now she notices that the drain is draining so denies any concerns about it not draining.  She denies any abdominal pain she reports that is just a burning sensation over the incisional scars.  She reports taking tramadol  without any effective symptoms.  Does report a little bit of increased urination denies any fevers, vomiting.  Still having good bowel movements.  Physical Exam   Triage Vital Signs: ED Triage Vitals  Encounter Vitals Group     BP 06/21/24 1959 (!) 126/95     Girls Systolic BP Percentile --      Girls Diastolic BP Percentile --      Boys Systolic BP Percentile --      Boys Diastolic BP Percentile --      Pulse Rate 06/21/24 1959 88     Resp 06/21/24 1959 18     Temp 06/21/24 1959 97.8 F (36.6 C)     Temp Source 06/21/24 1959 Oral     SpO2 06/21/24 1959 93 %     Weight 06/21/24 1959 280 lb (127 kg)     Height 06/21/24 1959 5' 11 (1.803 m)     Head Circumference --      Peak Flow --      Pain Score 06/21/24 2005 9     Pain Loc --      Pain Education --      Exclude from Growth Chart --     Most recent vital signs: Vitals:   06/21/24 1959  BP: (!) 126/95  Pulse: 88  Resp: 18  Temp: 97.8 F (36.6 C)  SpO2: 93%     General: Awake, no distress.  CV:  Good peripheral perfusion.  Resp:  Normal effort.  Abd:  No distention.  Incisional scars without any evidence of erythema.  Abdomen soft and nontender.  JP drain in place with some drainage inside the bulb.  Serosang. fluid  No pus noted. Other:     ED Results / Procedures / Treatments   Labs (all labs ordered are listed, but  only abnormal results are displayed) Labs Reviewed  COMPREHENSIVE METABOLIC PANEL WITH GFR - Abnormal; Notable for the following components:      Result Value   Glucose, Bld 135 (*)    BUN 22 (*)    Total Protein 6.4 (*)    Alkaline Phosphatase 134 (*)    All other components within normal limits  URINALYSIS, ROUTINE W REFLEX MICROSCOPIC - Abnormal; Notable for the following components:   Color, Urine YELLOW (*)    APPearance CLOUDY (*)    Glucose, UA 50 (*)    Leukocytes,Ua MODERATE (*)    Bacteria, UA MANY (*)    All other components within normal limits  LIPASE, BLOOD  CBC       PROCEDURES:  Critical Care performed: No  Procedures   MEDICATIONS ORDERED IN ED: Medications - No data to display   IMPRESSION / MDM / ASSESSMENT AND PLAN / ED COURSE  I reviewed the triage vital signs and the nursing notes.   Patient's presentation  is most consistent with acute presentation with potential threat to life or bodily function.  Patient comes in with concerns for postoperative burning sensation over incisional scars.  Discussed that this is most likely secondary to nerve irritation from incisions.  Her abdomen overall is reassuring she has got no evidence of vomiting or decreased difficulties having bowel movements suggest bowel obstruction.  She has got no fever no white count to suggest abscess.  Offered CT imaging to evaluate for more emergent conditions but patient declined.  We discussed increasing tramadol  to oxycodone .  Patient also does reports increased urinary frequency.  Explained at this would not be causing her symptoms as above but will place her on some Keflex  and send for urine culture for possible UTI  UA with concern for possible UTI.  Lipase normal CMP reassuring CBC reassuring    FINAL CLINICAL IMPRESSION(S) / ED DIAGNOSES   Final diagnoses:  Post-operative pain  Acute cystitis without hematuria     Rx / DC Orders   ED Discharge Orders           Ordered    oxyCODONE  (ROXICODONE ) 5 MG immediate release tablet  Every 6 hours PRN        06/22/24 0031             Note:  This document was prepared using Dragon voice recognition software and may include unintentional dictation errors.   Ernest Ronal BRAVO, MD 06/22/24 [phone]  "

## 2024-06-22 NOTE — Transitions of Care (Post Inpatient/ED Visit) (Signed)
" ° °  06/22/2024  Name: Andrea [name] MRN: [id number] DOB: 08/19/1980  Today's TOC FU Call Status: Today's TOC FU Call Status:: Unsuccessful Call (3rd Attempt) Unsuccessful Call (1st Attempt) Date: 06/21/24 Unsuccessful Call (2nd Attempt) Date: 06/22/24 Unsuccessful Call (3rd Attempt) Date: 06/22/24  Attempted to reach the patient regarding the most recent Inpatient/ED visit.  Follow Up Plan: No further outreach attempts will be made at this time. We have been unable to contact the patient.  Signature Nikki M,CMA "

## 2024-06-22 NOTE — Telephone Encounter (Signed)
 Copied from CRM [id number]. Topic: General - Call Back - No Documentation >> Jun 21, 2024  5:58 PM Alfonso ORN wrote: Reason for CRM: pt called to return call from nurse.please contact pt back

## 2024-06-22 NOTE — Transitions of Care (Post Inpatient/ED Visit) (Signed)
" ° °  06/22/2024  Name: Andrea [name] MRN: [id number] DOB: 21-May-1981  Today's TOC FU Call Status: Today's TOC FU Call Status:: Unsuccessful Call (2nd Attempt) Unsuccessful Call (1st Attempt) Date: 06/21/24 Unsuccessful Call (2nd Attempt) Date: 06/22/24  Attempted to reach the patient regarding the most recent Inpatient/ED visit.  Follow Up Plan: Additional outreach attempts will be made to reach the patient to complete the Transitions of Care (Post Inpatient/ED visit) call.   Signature Julian Lemmings, LPN [facility] Nurse Health Advisor Direct Dial [phone]  "

## 2024-06-23 NOTE — Telephone Encounter (Signed)
 No note of someone from our office contacting the pt

## 2024-06-24 LAB — URINE CULTURE: Culture: >=100000 — AB

## 2024-06-25 NOTE — Progress Notes (Signed)
 ED Antimicrobial Stewardship Positive Culture Follow Up   Andrea [name] is an 44 y.o. female who presented to [facility] on 06/21/2024 with a chief complaint of  Chief Complaint  Patient presents with   Post-op Problem    Recent Results (from the past 720 hours)  Microscopic Examination     Status: Abnormal   Collection Time: 06/16/24  9:46 AM   Urine  Result Value Ref Range Status   WBC, UA 0-5 0 - 5 /hpf Final   RBC, Urine 3-10 (A) 0 - 2 /hpf Final   Epithelial Cells (non renal) 0-10 0 - 10 /hpf Final   Mucus, UA Present (A) Not Estab. Final   Bacteria, UA Few None seen/Few Final  CULTURE, URINE COMPREHENSIVE     Status: None   Collection Time: 06/16/24 12:01 PM   Specimen: Urine   UR  Result Value Ref Range Status   Urine Culture, Comprehensive Final report  Final   Organism ID, Bacteria Comment  Final    Comment: Mixed urogenital flora 10,000-25,000 colony forming units per mL   Urine Culture     Status: Abnormal   Collection Time: 06/21/24  8:00 PM   Specimen: Urine, Clean Catch  Result Value Ref Range Status   Specimen Description   Final    URINE, CLEAN CATCH Performed at [facility], [address], [place], [place] [address]    Special Requests   Final    NONE Performed at [facility], [address] [place], [place] [address]    Culture (A)  Final    >=100,000 COLONIES/mL ESCHERICHIA COLI Confirmed Extended Spectrum Beta-Lactamase Producer (ESBL).  In bloodstream infections from ESBL organisms, carbapenems are preferred over piperacillin/tazobactam. They are shown to have a lower risk of mortality.    Report Status 06/24/2024 FINAL  Final   Organism ID, Bacteria ESCHERICHIA COLI (A)  Final      Susceptibility   Escherichia coli - MIC*    AMPICILLIN >=32 RESISTANT Resistant     CEFAZOLIN  (URINE) Value in next row Resistant      >=32 RESISTANTThis is a modified FDA-approved test that has been validated and its performance  characteristics determined by the reporting laboratory.  This laboratory is certified under the Clinical Laboratory Improvement Amendments CLIA as qualified to perform high complexity clinical laboratory testing.    CEFEPIME Value in next row Intermediate      >=32 RESISTANTThis is a modified FDA-approved test that has been validated and its performance characteristics determined by the reporting laboratory.  This laboratory is certified under the Clinical Laboratory Improvement Amendments CLIA as qualified to perform high complexity clinical laboratory testing.    ERTAPENEM Value in next row Sensitive      >=32 RESISTANTThis is a modified FDA-approved test that has been validated and its performance characteristics determined by the reporting laboratory.  This laboratory is certified under the Clinical Laboratory Improvement Amendments CLIA as qualified to perform high complexity clinical laboratory testing.    CEFTRIAXONE  Value in next row Resistant      >=32 RESISTANTThis is a modified FDA-approved test that has been validated and its performance characteristics determined by the reporting laboratory.  This laboratory is certified under the Clinical Laboratory Improvement Amendments CLIA as qualified to perform high complexity clinical laboratory testing.    CIPROFLOXACIN  Value in next row Resistant      >=32 RESISTANTThis is a modified FDA-approved test that has been validated and its performance characteristics determined by the  reporting laboratory.  This laboratory is certified under the Clinical Laboratory Improvement Amendments CLIA as qualified to perform high complexity clinical laboratory testing.    GENTAMICIN Value in next row Sensitive      >=32 RESISTANTThis is a modified FDA-approved test that has been validated and its performance characteristics determined by the reporting laboratory.  This laboratory is certified under the Clinical Laboratory Improvement Amendments CLIA as qualified to  perform high complexity clinical laboratory testing.    NITROFURANTOIN  Value in next row Sensitive      >=32 RESISTANTThis is a modified FDA-approved test that has been validated and its performance characteristics determined by the reporting laboratory.  This laboratory is certified under the Clinical Laboratory Improvement Amendments CLIA as qualified to perform high complexity clinical laboratory testing.    TRIMETH/SULFA Value in next row Resistant      >=32 RESISTANTThis is a modified FDA-approved test that has been validated and its performance characteristics determined by the reporting laboratory.  This laboratory is certified under the Clinical Laboratory Improvement Amendments CLIA as qualified to perform high complexity clinical laboratory testing.    AMPICILLIN/SULBACTAM Value in next row Resistant      >=32 RESISTANTThis is a modified FDA-approved test that has been validated and its performance characteristics determined by the reporting laboratory.  This laboratory is certified under the Clinical Laboratory Improvement Amendments CLIA as qualified to perform high complexity clinical laboratory testing.    PIP/TAZO Value in next row Sensitive      8 SENSITIVEThis is a modified FDA-approved test that has been validated and its performance characteristics determined by the reporting laboratory.  This laboratory is certified under the Clinical Laboratory Improvement Amendments CLIA as qualified to perform high complexity clinical laboratory testing.    MEROPENEM Value in next row Sensitive      8 SENSITIVEThis is a modified FDA-approved test that has been validated and its performance characteristics determined by the reporting laboratory.  This laboratory is certified under the Clinical Laboratory Improvement Amendments CLIA as qualified to perform high complexity clinical laboratory testing.    * >=100,000 COLONIES/mL ESCHERICHIA COLI    [x]  Treated with cephalexin , organism resistant to  prescribed antimicrobial   New antibiotic prescription: nitrofurantoin  100mg  bid x 5 days  ED Provider: Dr. 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 Patient discharged on cephalexin  500mg  TID x7 days for cystitis. Spoke with patient on the phone - reported increased frequency and new dysuria since discharged. Discussed culture and patient's symptoms with Dr. Ronie, and plan to change antibiotics to nitrofurantoin  500mg  bid x 5 days and stop cephalexin . 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 Confirmed plan with patient and called in prescription to [facility] in [place] on [address]   Andrea [name] ,[facility] 06/25/2024, 11:50 AM

## 2024-06-26 ENCOUNTER — Other Ambulatory Visit: Payer: Self-pay | Admitting: Nurse Practitioner

## 2024-06-28 ENCOUNTER — Other Ambulatory Visit: Payer: Self-pay

## 2024-06-28 ENCOUNTER — Encounter: Payer: Self-pay | Admitting: Emergency Medicine

## 2024-06-28 ENCOUNTER — Emergency Department
Admission: EM | Admit: 2024-06-28 | Discharge: 2024-06-29 | Disposition: A | Payer: MEDICAID | Attending: Emergency Medicine | Admitting: Emergency Medicine

## 2024-06-28 DIAGNOSIS — J45909 Unspecified asthma, uncomplicated: Secondary | ICD-10-CM | POA: Insufficient documentation

## 2024-06-28 DIAGNOSIS — I10 Essential (primary) hypertension: Secondary | ICD-10-CM | POA: Insufficient documentation

## 2024-06-28 DIAGNOSIS — K521 Toxic gastroenteritis and colitis: Secondary | ICD-10-CM

## 2024-06-28 DIAGNOSIS — E119 Type 2 diabetes mellitus without complications: Secondary | ICD-10-CM | POA: Diagnosis not present

## 2024-06-28 DIAGNOSIS — R1031 Right lower quadrant pain: Secondary | ICD-10-CM | POA: Insufficient documentation

## 2024-06-28 LAB — COMPREHENSIVE METABOLIC PANEL WITH GFR
ALT: 18 U/L (ref 0–44)
AST: 20 U/L (ref 15–41)
Albumin: 4.4 g/dL (ref 3.5–5.0)
Alkaline Phosphatase: 146 U/L — ABNORMAL HIGH (ref 38–126)
Anion gap: 14 (ref 5–15)
BUN: 15 mg/dL (ref 6–20)
CO2: 21 mmol/L — ABNORMAL LOW (ref 22–32)
Calcium: 9.1 mg/dL (ref 8.9–10.3)
Chloride: 107 mmol/L (ref 98–111)
Creatinine, Ser: 0.83 mg/dL (ref 0.44–1.00)
GFR, Estimated: >60 mL/min
Glucose, Bld: 165 mg/dL — ABNORMAL HIGH (ref 70–99)
Potassium: 3.6 mmol/L (ref 3.5–5.1)
Sodium: 142 mmol/L (ref 135–145)
Total Bilirubin: 0.3 mg/dL (ref 0.0–1.2)
Total Protein: 6.7 g/dL (ref 6.5–8.1)

## 2024-06-28 LAB — CBC
HCT: 42.7 % (ref 36.0–46.0)
Hemoglobin: 13.3 g/dL (ref 12.0–15.0)
MCH: 28.4 pg (ref 26.0–34.0)
MCHC: 31.1 g/dL (ref 30.0–36.0)
MCV: 91 fL (ref 80.0–100.0)
Platelets: 241 K/uL (ref 150–400)
RBC: 4.69 MIL/uL (ref 3.87–5.11)
RDW: 12.2 % (ref 11.5–15.5)
WBC: 7.5 K/uL (ref 4.0–10.5)
nRBC: 0 % (ref 0.0–0.2)

## 2024-06-28 LAB — LIPASE, BLOOD: Lipase: 47 U/L (ref 11–51)

## 2024-06-28 NOTE — Progress Notes (Unsigned)
 PROVIDER NOTE: Interpretation of information contained herein should be left to medically-trained personnel. Specific patient instructions are provided elsewhere under Patient Instructions section of medical record. This document was created in part using AI and STT-dictation technology, any transcriptional errors that may result from this process are unintentional.  Patient: Andrea [name]  Service: E/M   PCP: Gretel App, NP  DOB: 06-05-1981  DOS: 06/29/2024  Provider: Emmy MARLA Blanch, NP  MRN: [id number]  Delivery: Face-to-face  Specialty: Interventional Pain Management  Type: Established Patient  Setting: Ambulatory outpatient facility  Specialty designation: 09  Referring Prov.: Gretel App, NP  Location: Outpatient office facility       History of present illness (HPI) Andrea [name], a 44 y.o. year old female, is here today because of her No primary diagnosis found.. Andrea [name]'s primary complain today is No chief complaint on file.  Pertinent problems: Andrea [name] has Migraines; Obesity, Class III, BMI 40-49.9 (morbid obesity) (HCC); Chronic post-traumatic stress disorder; Major depressive disorder, recurrent severe without psychotic features (HCC); Long term current use of antipsychotic medication; Chronic ankle pain (1ry area of Pain) (Right); Type 2 diabetes mellitus with peripheral neuropathy (HCC); Acute right-sided low back pain without sciatica; Closed trimalleolar fracture of ankle, sequela (Right); Diabetic nephropathy associated with type 2 diabetes mellitus (HCC); Chronic pain syndrome; Pharmacologic therapy; Disorder of skeletal system; Problems influencing health status; Long term prescription benzodiazepine use; Long-term current use of opiate analgesic; Chronic prescription opiate use; Closed ankle fracture, sequela (Right); Chronic postoperative pain of ankle (Right); Primary localized osteoarthrosis of ankle and foot; and S/P ankle fusion (Right) on their pertinent  problem list.  Pain Assessment: Severity of   is reported as a  /10. Location:    / . Onset:  . Quality:  . Timing:  . Modifying factor(s):  SABRA Vitals:  vitals were not taken for this visit.  BMI: Estimated body mass index is 39.05 kg/m as calculated from the following:   Height as of 06/21/24: 5' 11 (1.803 m).   Weight as of 06/21/24: 280 lb (127 kg).  Last encounter: Visit date not found. Last procedure: Visit date not found.  Reason for encounter:  *** .   Discussed the use of AI scribe software for clinical note transcription with the patient, who gave verbal consent to proceed.  History of Present Illness           Pharmacotherapy Assessment   {There is no content from the last Subjective section.}  Monitoring: [place] PMP: PDMP not reviewed this encounter.       Pharmacotherapy: No side-effects or adverse reactions reported. Compliance: No problems identified. Effectiveness: Clinically acceptable.  No notes on file  UDS:  Summary  Date Value Ref Range Status  01/19/2024 FINAL  Final    Comment:    ==================================================================== Compliance Drug Analysis, Ur ==================================================================== Test                             Result       Flag       Units  Drug Present and Declared for Prescription Verification   7-aminoclonazepam              36           EXPECTED   ng/mg creat    7-aminoclonazepam is an expected metabolite of clonazepam . Source of    clonazepam  is a scheduled prescription medication.    Oxycodone   2076         EXPECTED   ng/mg creat   Oxymorphone                    106          EXPECTED   ng/mg creat   Noroxycodone                   3254         EXPECTED   ng/mg creat   Noroxymorphone                 255          EXPECTED   ng/mg creat    Sources of oxycodone  are scheduled prescription medications.    Oxymorphone, noroxycodone, and noroxymorphone are expected     metabolites of oxycodone . Oxymorphone is also available as a    scheduled prescription medication.    Gabapentin                      PRESENT      EXPECTED   Zonisamide                      PRESENT      EXPECTED   Tizanidine                      PRESENT      EXPECTED   Trazodone                       PRESENT      EXPECTED   1,3 chlorophenyl piperazine    PRESENT      EXPECTED    1,3-chlorophenyl piperazine is an expected metabolite of trazodone .    Duloxetine                      PRESENT      EXPECTED   Quetiapine                      PRESENT      EXPECTED   Acetaminophen                   PRESENT      EXPECTED  Drug Present not Declared for Prescription Verification   Alcohol , Ethyl                 0.040        UNEXPECTED g/dL    Sources of ethyl alcohol  include alcoholic beverages or as a    fermentation product of glucose; glucose is present in this specimen.    Interpret result with caution, as the presence of ethyl alcohol  is    likely due, at least in part, to fermentation of glucose.    Ibuprofen                       PRESENT      UNEXPECTED   Naproxen                        PRESENT      UNEXPECTED ==================================================================== Test                      Result    Flag   Units      Ref Range   Creatinine  186              mg/dL      >=79 ==================================================================== Declared Medications:  The flagging and interpretation on this report are based on the  following declared medications.  Unexpected results may arise from  inaccuracies in the declared medications.   **Note: The testing scope of this panel includes these medications:   Clonazepam  (Klonopin )  Duloxetine  (Cymbalta )  Gabapentin  (Neurontin )  Oxycodone  (Percocet)  Quetiapine  (Seroquel )  Trazodone  (Desyrel )  Zonisamide  (Zonegran )   **Note: The testing scope of this panel does not include small to  moderate amounts of these reported  medications:   Acetaminophen  (Percocet)  Tizanidine  (Zanaflex )   **Note: The testing scope of this panel does not include the  following reported medications:   Albuterol  (Airsupra)  Atogepant  (Qulipta )  Budesonide  (Airsupra)  Budesonide  (Symbicort)  Buspirone  (Buspar )  Cetirizine  (Zyrtec )  Eletriptan  (Relpax )  Empagliflozin  (Jardiance )  Famotidine  (Pepcid )  Fluconazole  (Diflucan )  Formoterol  (Symbicort)  Montelukast  (Singulair )  Naloxone  (Narcan )  Ondansetron  (Zofran )  Pantoprazole  (Protonix )  Potassium Chloride   Prazosin  (Minipress )  Semaglutide  (Ozempic )  Vibegron (Gemtesa) ==================================================================== For clinical consultation, please call [phone]. ====================================================================     No results found for: CBDTHCR No results found for: D8THCCBX No results found for: D9THCCBX  ROS  Constitutional: Denies any fever or chills Gastrointestinal: No reported hemesis, hematochezia, vomiting, or acute GI distress Musculoskeletal: Denies any acute onset joint swelling, redness, loss of ROM, or weakness Neurological: No reported episodes of acute onset apraxia, aphasia, dysarthria, agnosia, amnesia, paralysis, loss of coordination, or loss of consciousness  Medication Review  Albuterol -Budesonide , Atogepant , DULoxetine , QUEtiapine , acetaminophen , buPROPion , budesonide -formoterol , busPIRone , cephALEXin , cetirizine , clonazePAM , cyclobenzaprine , docusate sodium , eletriptan , empagliflozin , famotidine , gabapentin , hydrOXYzine , ibuprofen , mirabegron  ER, montelukast , naloxone , ondansetron , pantoprazole , potassium chloride , promethazine , tamsulosin , traMADol , traZODone , and zonisamide   History Review  Allergy: Ms. Hrivnak is allergic to bactrim [sulfamethoxazole -trimethoprim], codeine, and sulfa antibiotics. Drug: Ms. Langenfeld  reports no history of drug use. Alcohol :  reports no history of  alcohol  use. Tobacco:  reports that she has never smoked. She has never used smokeless tobacco. Social: Ms. Morrissette  reports that she has never smoked. She has never used smokeless tobacco. She reports that she does not drink alcohol  and does not use drugs. Medical:  has a past medical history of Allergy, Anemia, Anxiety, Arthritis, Asthma, Bipolar 1 disorder (HCC), Bipolar 1 disorder, mixed, severe (HCC) (12/20/2016), Bipolar I disorder, most recent episode depressed (HCC) (12/20/2016), Complication of anesthesia, Depression, Diabetes mellitus without complication (HCC), Drug induced akathisia (04/02/2022), GERD (gastroesophageal reflux disease), Hypertension, Kidney stones, Long term current use of antipsychotic medication (03/13/2022), Low back pain, Migraines, Neuromuscular disorder (HCC), PCOS (polycystic ovarian syndrome), PONV (postoperative nausea and vomiting), Schizophrenia (HCC), Seizure (HCC) (10/03/2021), Seizures (HCC) (06/04/2016), and Shortness of breath dyspnea. Surgical: Ms. Parfitt  has a past surgical history that includes Cholecystectomy; LYMPH NODES REMOVED; ovarian cyst removed; Intrauterine device (iud) insertion (06/14/2014); Abdominal hysterectomy (N/A, 11/14/2015); Salpingoophorectomy (Bilateral, 11/14/2015); ORIF ankle fracture (Right, 05/03/2023); Ankle Fusion (Right, 05/13/2023); Fracture surgery; Xi robotic laparoscopic assisted appendectomy (N/A, 06/19/2024); and Appendectomy. Family: family history includes Breast cancer in her maternal grandmother; Depression in her maternal aunt and maternal aunt; Diabetes in her maternal aunt; Healthy in her brother and sister; Heart disease in her mother; Hypertension in her father and mother; Miscarriages / Stillbirths in her mother.  Laboratory Chemistry Profile   Renal Lab Results  Component Value Date   BUN 22 (H) 06/21/2024   CREATININE 0.76 06/21/2024   BCR 19  04/27/2024   GFR 83.69 06/15/2024   GFRAA >60 01/01/2020    GFRNONAA >60 06/21/2024    Hepatic Lab Results  Component Value Date   AST 16 06/21/2024   ALT 14 06/21/2024   ALBUMIN 4.2 06/21/2024   ALKPHOS 134 (H) 06/21/2024   AMYLASE 54 08/18/2007   LIPASE 48 06/21/2024    Electrolytes Lab Results  Component Value Date   NA 142 06/21/2024   K 3.7 06/21/2024   CL 105 06/21/2024   CALCIUM 8.9 06/21/2024   MG 2.1 01/19/2024   PHOS 4.5 11/26/2021    Bone Lab Results  Component Value Date   VD25OH 38.99 01/16/2024    Inflammation (CRP: Acute Phase) (ESR: Chronic Phase) Lab Results  Component Value Date   CRP 0.8 05/05/2024   ESRSEDRATE 6 05/05/2024   LATICACIDVEN 1.6 05/27/2023         Note: Above Lab results reviewed.  Recent Imaging Review  CT RENAL STONE STUDY Addendum: ADDENDUM REPORT: 06/18/2024 14:23   ADDENDUM:  Please note, impression #4 should read:   Partially imaged punctate radiodensity in the region of the left  anterior descending coronary artery, which may represent CORONARY  ARTERY calcification. Assessment for potential risk factor  modification, dietary therapy or pharmacologic therapy may be  warranted, if clinically indicated.   Electronically Signed    By: Limin  Xu M.D.    On: 06/18/2024 14:23 Narrative: CLINICAL DATA:  One-week history of gross hematuria and right flank pain  EXAM: CT ABDOMEN AND PELVIS WITHOUT CONTRAST  TECHNIQUE: Multidetector CT imaging of the abdomen and pelvis was performed following the standard protocol without IV contrast.  RADIATION DOSE REDUCTION: This exam was performed according to the departmental dose-optimization program which includes automated exposure control, adjustment of the mA and/or kV according to patient size and/or use of iterative reconstruction technique.  COMPARISON:  CT abdomen and pelvis dated 07/18/2023  FINDINGS: Lower chest: No focal consolidation or pulmonary nodule in the lung bases. No pleural effusion or pneumothorax demonstrated.  Partially imaged heart size is normal. Partially imaged punctate radiodensity in the region of the left anterior descending coronary artery (2:1).  Hepatobiliary: No focal hepatic lesions. No intra or extrahepatic biliary ductal dilation. Cholecystectomy.  Pancreas: No focal lesions or main ductal dilation.  Spleen: Enlarged spleen measures 15.0 cm in AP dimension, unchanged.  Adrenals/Urinary Tract: No adrenal nodules. No suspicious renal mass or hydronephrosis. Punctate nonobstructing right upper pole stone. Multiple nonobstructing left renal calculi measuring up to 3 mm. No focal bladder wall thickening.  Stomach/Bowel: Normal appearance of the stomach. No evidence of bowel wall thickening, distention, or inflammatory changes. Dilated appendiceal tip, located along the inferomedial margin of the right hepatic lobe (2:44) measures up to 8 mm with mild periappendiceal stranding.  Vascular/Lymphatic: No significant vascular findings are present. Accessory inferior right renal artery. No enlarged abdominal or pelvic lymph nodes.  Reproductive: No adnexal masses.  Other: No free fluid, fluid collection, or free air.  Musculoskeletal: No acute or abnormal lytic or blastic osseous lesions. Healing anterior right fifth rib fracture.  IMPRESSION: 1. Dilated appendiceal tip, located along the inferomedial margin of the right hepatic lobe, measures up to 8 mm with mild periappendiceal stranding, raising suspicion for early acute tip appendicitis. 2. Bilateral nonobstructing renal calculi measuring up to 3 mm on the left and 2 mm on the right. 3. Unchanged splenomegaly. 4. Partially imaged punctate radiodensity in the region of the left anterior descending coronary artery, which may represent atherosclerotic  calcification.  These results will be called to the ordering clinician or representative by the Radiologist Assistant, and communication documented in the PACS or [employer].  Electronically Signed: By: Limin  Xu M.D. On: 06/18/2024 14:10 Note: Reviewed        Physical Exam  Vitals: LMP  (LMP Unknown)  BMI: Estimated body mass index is 39.05 kg/m as calculated from the following:   Height as of 06/21/24: 5' 11 (1.803 m).   Weight as of 06/21/24: 280 lb (127 kg). Ideal: Ideal body weight: 70.8 kg (156 lb 1.4 oz) Adjusted ideal body weight: 93.3 kg (205 lb 10.4 oz) General appearance: Well nourished, well developed, and well hydrated. In no apparent acute distress Mental status: Alert, oriented x 3 (person, place, & time)       Respiratory: No evidence of acute respiratory distress Eyes: PERLA   Assessment   Diagnosis Status  No diagnosis found. Controlled Controlled Controlled   Updated Problems: Problem  Obesity, Class III, Bmi 40-49.9 (Morbid Obesity) (Hcc)   Formatting of this note might be different from the original. Last Assessment & Plan:  Continue phentermine  as directed. Recheck in 3 months.     Plan of Care  Problem-specific:  Assessment and Plan            Ms. KRISNA OMAR has a current medication list which includes the following long-term medication(s): bupropion , clonazepam , duloxetine , eletriptan , famotidine , gabapentin , montelukast , pantoprazole , potassium chloride , promethazine , symbicort, trazodone , and zonisamide .  Pharmacotherapy (Medications Ordered): No orders of the defined types were placed in this encounter.  Orders:  No orders of the defined types were placed in this encounter.    {There is no content from the last Plan section.}   No follow-ups on file.    Recent Visits Date Type Provider Dept  06/07/24 Office Visit Tanya Glisson, MD [facility]  04/12/24 Office Visit Tanya Glisson, MD [facility]  Showing recent visits within past 90 days and meeting all other requirements Future Appointments Date Type Provider Dept  06/29/24 Appointment Wells Mabe K,  NP [facility]  Showing future appointments within next 90 days and meeting all other requirements  I discussed the assessment and treatment plan with the patient. The patient was provided an opportunity to ask questions and all were answered. The patient agreed with the plan and demonstrated an understanding of the instructions.  Patient advised to call back or seek an in-person evaluation if the symptoms or condition worsens.  Duration of encounter: *** minutes.  Total time on encounter, as per AMA guidelines included both the face-to-face and non-face-to-face time personally spent by the physician and/or other qualified health care professional(s) on the day of the encounter (includes time in activities that require the physician or other qualified health care professional and does not include time in activities normally performed by clinical staff). Physician's time may include the following activities when performed: Preparing to see the patient (e.g., pre-charting review of records, searching for previously ordered imaging, lab work, and nerve conduction tests) Review of prior analgesic pharmacotherapies. Reviewing PMP Interpreting ordered tests (e.g., lab work, imaging, nerve conduction tests) Performing post-procedure evaluations, including interpretation of diagnostic procedures Obtaining and/or reviewing separately obtained history Performing a medically appropriate examination and/or evaluation Counseling and educating the patient/family/caregiver Ordering medications, tests, or procedures Referring and communicating with other health care professionals (when not separately reported) Documenting clinical information in the electronic or other health record Independently interpreting results (not separately reported) and  communicating results to the patient/ family/caregiver Care coordination (not separately reported)  Note by: Amariyana Heacox K Natalya Domzalski, NP (TTS and AI technology used.  I apologize for any typographical errors that were not detected and corrected.) Date: 06/29/2024; Time: 10:01 AM

## 2024-06-28 NOTE — ED Triage Notes (Addendum)
 Pt presents to the ED via POV with complaints of R side pain near her incision site from a appendectomy on 1/2. Pt seen here previously for same. Describes the pain as crampy/sharp in nature. Last took Tylenol  around noon with no relief. Of note, pt has a drain in place with adequate output over the last few days. A&Ox4 at this time. Denies CP or SOB.

## 2024-06-29 ENCOUNTER — Ambulatory Visit: Payer: MEDICAID | Admitting: Nurse Practitioner

## 2024-06-29 ENCOUNTER — Ambulatory Visit: Payer: MEDICAID | Admitting: Internal Medicine

## 2024-06-29 ENCOUNTER — Encounter: Payer: Self-pay | Admitting: Internal Medicine

## 2024-06-29 ENCOUNTER — Emergency Department: Payer: MEDICAID

## 2024-06-29 VITALS — BP 128/102 | HR 82 | Temp 97.7°F | Ht 71.0 in | Wt 272.2 lb

## 2024-06-29 DIAGNOSIS — T3695XA Adverse effect of unspecified systemic antibiotic, initial encounter: Secondary | ICD-10-CM | POA: Insufficient documentation

## 2024-06-29 DIAGNOSIS — Z09 Encounter for follow-up examination after completed treatment for conditions other than malignant neoplasm: Secondary | ICD-10-CM | POA: Insufficient documentation

## 2024-06-29 DIAGNOSIS — R1031 Right lower quadrant pain: Secondary | ICD-10-CM

## 2024-06-29 LAB — URINALYSIS, ROUTINE W REFLEX MICROSCOPIC
Bacteria, UA: NONE SEEN
Bilirubin Urine: NEGATIVE
Glucose, UA: >=500 mg/dL — AB
Hgb urine dipstick: NEGATIVE
Ketones, ur: NEGATIVE mg/dL
Leukocytes,Ua: NEGATIVE
Nitrite: NEGATIVE
Protein, ur: NEGATIVE mg/dL
Specific Gravity, Urine: 1.04 — ABNORMAL HIGH (ref 1.005–1.030)
pH: 5 (ref 5.0–8.0)

## 2024-06-29 MED ORDER — MORPHINE SULFATE (PF) 4 MG/ML IV SOLN
4.0000 mg | Freq: Once | INTRAVENOUS | Status: AC
  Administered 2024-06-29: 4 mg via INTRAVENOUS
  Filled 2024-06-29: qty 1

## 2024-06-29 MED ORDER — HYDROMORPHONE HCL 1 MG/ML IJ SOLN
1.0000 mg | Freq: Once | INTRAMUSCULAR | Status: AC
  Administered 2024-06-29: 1 mg via INTRAVENOUS
  Filled 2024-06-29: qty 1

## 2024-06-29 MED ORDER — SODIUM CHLORIDE 0.9 % IV BOLUS (SEPSIS)
1000.0000 mL | Freq: Once | INTRAVENOUS | Status: AC
  Administered 2024-06-29: 1000 mL via INTRAVENOUS

## 2024-06-29 MED ORDER — KETOROLAC TROMETHAMINE 10 MG PO TABS: 10.0000 mg | ORAL_TABLET | Freq: Four times a day (QID) | ORAL | 0 refills | Status: DC | PRN

## 2024-06-29 MED ORDER — HYDROCODONE-ACETAMINOPHEN 10-325 MG PO TABS: 1.0000 | ORAL_TABLET | Freq: Four times a day (QID) | ORAL | 0 refills | Status: AC | PRN

## 2024-06-29 MED ORDER — ONDANSETRON HCL 4 MG/2ML IJ SOLN
4.0000 mg | Freq: Once | INTRAMUSCULAR | Status: AC
  Administered 2024-06-29: 4 mg via INTRAVENOUS
  Filled 2024-06-29: qty 2

## 2024-06-29 MED ORDER — ONDANSETRON 4 MG PO TBDP: 4.0000 mg | ORAL_TABLET | Freq: Four times a day (QID) | ORAL | 0 refills | Status: AC | PRN

## 2024-06-29 MED ORDER — DICYCLOMINE HCL 20 MG PO TABS: 20.0000 mg | ORAL_TABLET | Freq: Three times a day (TID) | ORAL | 0 refills | Status: DC | PRN

## 2024-06-29 MED ORDER — KETOROLAC TROMETHAMINE 30 MG/ML IJ SOLN
30.0000 mg | Freq: Once | INTRAMUSCULAR | Status: AC
  Administered 2024-06-29: 30 mg via INTRAVENOUS
  Filled 2024-06-29: qty 1

## 2024-06-29 MED ORDER — IOHEXOL 350 MG/ML SOLN
100.0000 mL | Freq: Once | INTRAVENOUS | Status: AC | PRN
  Administered 2024-06-29: 100 mL via INTRAVENOUS

## 2024-06-29 NOTE — Progress Notes (Signed)
 "  Subjective:  Patient ID: Andrea [name], female    DOB: 17-Jul-1980  Age: 44 y.o. MRN: [id number]  CC: The primary encounter diagnosis was Antibiotic-associated diarrhea. Diagnoses of Right lower quadrant abdominal pain and Hospital discharge follow-up were also pertinent to this visit.   HPI Andrea [name] presents for  Chief Complaint  Patient presents with   Hospitalization Follow-up   Flank Pain   PATIENT is a 44 yr old female with type 2 DM/obesity, recurrent UTIs,  chronic migraines,  recent ankle fracture s/o ORIF  in late October,  who WAS  admitted on 06/18/24 with acute appendicitis and taken to the OR by Dr Tye  on 06/19/24 for a robotic appendectomy. She had a drain placed intra-op. Post-op, the patient did well and was discharged with drain in place on Jan 4 with tramadol  for pain management. .  Returned to ER on  jan 5 with concerns for postoperative burning sensation over incisional scars. Reassured that  her pain was most likely secondary to nerve irritation from incisions.   Was treated empirically for UTI with keflex  and offered oxycodone  for pain management given lack of relief with tramadol  . Given #12 oxycodone .   Returned to ER again last evening (Jan 12) with right flank pain and no output from drain.    UTI:  E  coli,  resistant to most oral meds.  Abx were  changed from keflex  to macrobid  following culture and sensitivities on Jan 9 . During ER visit she reported  subjective fevers at home. Having diarrhea. UA was repeated,  no WBC or bacteria,    glucosuria noted.  CT done   no evidence of abscess,  or kidney stones  CBC normal .  Received 4 mg IV morphine ,  followed by 1 mg dilaudid  and 30mg  ketorolac      Outpatient Medications Prior to Visit  Medication Sig Dispense Refill   Albuterol -Budesonide  (AIRSUPRA) 90-80 MCG/ACT AERO      Atogepant  (QULIPTA ) 60 MG TABS Take 1 tablet daily 90 tablet 3   buPROPion  (WELLBUTRIN  XL) 300 MG 24 hr tablet Take 300 mg by  mouth every morning.     busPIRone  (BUSPAR ) 10 MG tablet Take 1 tablet (10 mg total) by mouth 3 (three) times daily. 90 tablet 2   cetirizine  (ZYRTEC ) 10 MG tablet Take 10 mg by mouth at bedtime.     clonazePAM  (KLONOPIN ) 0.5 MG tablet Take 1 tablet (0.5 mg total) by mouth 3 (three) times daily as needed for anxiety (sleep). 90 tablet 2   dicyclomine  (BENTYL ) 20 MG tablet Take 1 tablet (20 mg total) by mouth every 8 (eight) hours as needed. 15 tablet 0   DULoxetine  (CYMBALTA ) 60 MG capsule Take 2 capsules (120 mg total) by mouth daily. 180 capsule 0   eletriptan  (RELPAX ) 40 MG tablet Take 1 tablet at onset of migraine. May repeat in 2 hours if headache persists or recurs. Do not take more than 3 a week 10 tablet 5   empagliflozin  (JARDIANCE ) 25 MG TABS tablet Take 25 mg by mouth.     famotidine  (PEPCID ) 40 MG tablet Take 1 tablet (40 mg total) by mouth 2 (two) times daily. 180 tablet 0   gabapentin  (NEURONTIN ) 600 MG tablet Take 1 tablet (600 mg total) by mouth 3 (three) times daily. 90 tablet 2   hydrOXYzine  (ATARAX ) 50 MG tablet Take by mouth 2 (two) times daily.     hydrOXYzine  (VISTARIL ) 50 MG capsule Take 1 capsule (50  mg total) by mouth every 4 (four) hours as needed for anxiety. 180 capsule 2   mirabegron  ER (MYRBETRIQ ) 25 MG TB24 tablet Take 25 mg by mouth daily.     montelukast  (SINGULAIR ) 10 MG tablet TAKE 1 TABLET BY MOUTH EVERYDAY AT BEDTIME 90 tablet 3   naloxone  (NARCAN ) nasal spray 4 mg/0.1 mL Place 1 spray into the nose as needed for up to 365 doses (for opioid-induced respiratory depresssion). In case of emergency (overdose), spray once into each nostril. If no response within 3 minutes, repeat application and call 911. 1 each 1   nitrofurantoin , macrocrystal-monohydrate, (MACROBID ) 100 MG capsule Take 100 mg by mouth 2 (two) times daily.     ondansetron  (ZOFRAN -ODT) 4 MG disintegrating tablet Take 1 tablet (4 mg total) by mouth every 6 (six) hours as needed for nausea or vomiting.  20 tablet 0   pantoprazole  (PROTONIX ) 40 MG tablet TAKE 1 TABLET BY MOUTH EVERY DAY IN THE MORNING 90 tablet 1   potassium chloride  (MICRO-K ) 10 MEQ CR capsule TAKE 1 CAPSULE BY MOUTH EVERY DAY 90 capsule 3   promethazine  (PHENERGAN ) 25 MG tablet Take 1 tablet (25 mg total) by mouth every 8 (eight) hours as needed for nausea or vomiting. 30 tablet 0   QUEtiapine  (SEROQUEL ) 200 MG tablet Take 200 mg by mouth at bedtime.     SYMBICORT 160-4.5 MCG/ACT inhaler Inhale 2 puffs into the lungs.     tamsulosin  (FLOMAX ) 0.4 MG CAPS capsule Take 1 capsule (0.4 mg total) by mouth daily. 30 capsule 0   traZODone  (DESYREL ) 150 MG tablet TAKE 1 TABLET BY MOUTH AT BEDTIME. 90 tablet 3   zonisamide  (ZONEGRAN ) 100 MG capsule Take 5 capsules every night 150 capsule 11   buPROPion  (WELLBUTRIN  XL) 150 MG 24 hr tablet Take 1 tablet (150 mg total) by mouth daily. 30 tablet 2   QUEtiapine  (SEROQUEL ) 100 MG tablet Take 100 mg by mouth 2 (two) times daily. Take 100 mg by mouth in AM and 200 mg by mouth in PM (Patient taking differently: Take 100 mg by mouth at bedtime. Take 100 mg by mouth in AM and 200 mg by mouth in PM)     acetaminophen  (TYLENOL ) 325 MG tablet Take 2 tablets (650 mg total) by mouth every 8 (eight) hours as needed for mild pain (pain score 1-3). 40 tablet 0   cephALEXin  (KEFLEX ) 500 MG capsule Take 1 capsule (500 mg total) by mouth 3 (three) times daily for 7 days. 21 capsule 0   cyclobenzaprine  (FLEXERIL ) 5 MG tablet Take 1 tablet (5 mg total) by mouth 3 (three) times daily as needed. 30 tablet 0   docusate sodium  (COLACE) 100 MG capsule Take 1 capsule (100 mg total) by mouth 2 (two) times daily as needed for up to 10 days for mild constipation. 20 capsule 0   ibuprofen  (ADVIL ) 400 MG tablet Take 1 tablet (400 mg total) by mouth every 8 (eight) hours as needed for mild pain (pain score 1-3) or moderate pain (pain score 4-6). 30 tablet 0   traMADol  (ULTRAM ) 50 MG tablet Take 1 tablet (50 mg total) by mouth  every 8 (eight) hours as needed. 6 tablet 0   No facility-administered medications prior to visit.    Review of Systems;  Patient denies headache, fevers, malaise, unintentional weight loss, skin rash, eye pain, sinus congestion and sinus pain, sore throat, dysphagia,  hemoptysis , cough, dyspnea, wheezing, chest pain, palpitations, orthopnea, edema, abdominal pain, nausea, melena, diarrhea,  constipation, flank pain, dysuria, hematuria, urinary  Frequency, nocturia, numbness, tingling, seizures,  Focal weakness, Loss of consciousness,  Tremor, insomnia, depression, anxiety, and suicidal ideation.      Objective:  BP (!) 128/102 (BP Location: Left Arm, Patient Position: Sitting)   Pulse 82   Temp 97.7 F (36.5 C) (Oral)   Ht 5' 11 (1.803 m)   Wt 272 lb 3.2 oz (123.5 kg)   LMP  (LMP Unknown)   SpO2 95%   BMI 37.96 kg/m   BP Readings from Last 3 Encounters:  06/29/24 (!) 128/102  06/29/24 (!) 143/90  06/22/24 127/82    Wt Readings from Last 3 Encounters:  06/29/24 272 lb 3.2 oz (123.5 kg)  06/21/24 280 lb (127 kg)  06/18/24 280 lb (127 kg)    Physical Exam Vitals reviewed.  Constitutional:      General: She is not in acute distress.    Appearance: Normal appearance. She is normal weight. She is not ill-appearing, toxic-appearing or diaphoretic.  HENT:     Head: Normocephalic.  Eyes:     General: No scleral icterus.       Right eye: No discharge.        Left eye: No discharge.     Conjunctiva/sclera: Conjunctivae normal.  Cardiovascular:     Rate and Rhythm: Normal rate and regular rhythm.     Heart sounds: Normal heart sounds.  Pulmonary:     Effort: Pulmonary effort is normal. No respiratory distress.     Breath sounds: Normal breath sounds.  Abdominal:     General: Bowel sounds are normal.     Palpations: Abdomen is soft.     Tenderness: There is abdominal tenderness in the left upper quadrant.      Comments: Surgical incisions left upper and lower quadrants    Musculoskeletal:        General: Normal range of motion.  Skin:    General: Skin is warm and dry.  Neurological:     General: No focal deficit present.     Mental Status: She is alert and oriented to person, place, and time. Mental status is at baseline.  Psychiatric:        Mood and Affect: Mood normal.        Behavior: Behavior normal.        Thought Content: Thought content normal.        Judgment: Judgment normal.     Lab Results  Component Value Date   HGBA1C 6.2 (A) 05/19/2024   HGBA1C 6.2 05/19/2024   HGBA1C 6.2 05/19/2024   HGBA1C 6.2 05/19/2024    Lab Results  Component Value Date   CREATININE 0.83 06/28/2024   CREATININE 0.76 06/21/2024   CREATININE 0.65 06/20/2024    Lab Results  Component Value Date   WBC 7.5 06/28/2024   HGB 13.3 06/28/2024   HCT 42.7 06/28/2024   PLT 241 06/28/2024   GLUCOSE 165 (H) 06/28/2024   CHOL 238 (H) 06/26/2023   TRIG 209.0 (H) 06/26/2023   HDL 46.70 06/26/2023   LDLCALC 149 (H) 06/26/2023   ALT 18 06/28/2024   AST 20 06/28/2024   NA 142 06/28/2024   K 3.6 06/28/2024   CL 107 06/28/2024   CREATININE 0.83 06/28/2024   BUN 15 06/28/2024   CO2 21 (L) 06/28/2024   TSH 0.66 01/16/2024   INR 1.1 11/26/2021   HGBA1C 6.2 (A) 05/19/2024   HGBA1C 6.2 05/19/2024   HGBA1C 6.2 05/19/2024   HGBA1C 6.2 05/19/2024  CT ABDOMEN PELVIS W CONTRAST Result Date: 06/29/2024 EXAM: CT ABDOMEN AND PELVIS WITH CONTRAST 06/29/2024 01:34:06 AM TECHNIQUE: CT of the abdomen and pelvis was performed with the administration of 100 ml Omnipaque  350 intravenous contrast. Multiplanar reformatted images are provided for review. Automated exposure control, iterative reconstruction, and/or weight-based adjustment of the mA/kV was utilized to reduce the radiation dose to as low as reasonably achievable. COMPARISON: None available. CLINICAL HISTORY: Abdominal pain following prior appendectomy. FINDINGS: LOWER CHEST: No acute abnormality. LIVER: The liver is  unremarkable. GALLBLADDER AND BILE DUCTS: The gallbladder has been surgically removed. No biliary ductal dilatation. SPLEEN: No acute abnormality. PANCREAS: No acute abnormality. ADRENAL GLANDS: No acute abnormality. KIDNEYS, URETERS AND BLADDER: Tiny nonobstructing left renal stones are seen. No hydronephrosis. No perinephric or periureteral stranding. The bladder is within normal limits. GI AND BOWEL: Stomach and small bowel are within normal limits. No obstructive or inflammatory changes of the colon are seen. The appendix is not visualized, consistent with the prior surgical history. There is no bowel obstruction. PERITONEUM AND RETROPERITONEUM: No ascites. No free air. No postoperative fluid collection is identified. Surgical drain is seen in place. VASCULATURE: Aorta is normal in caliber. LYMPH NODES: No lymphadenopathy. REPRODUCTIVE ORGANS: The uterus has been surgically removed. No acute abnormality of the remaining reproductive organs. BONES AND SOFT TISSUES: No acute osseous abnormality is seen. Changes are noted in the abdominal wall related to the recent robotic surgery. IMPRESSION: 1. No CT evidence of postoperative complication following appendectomy, including no abscess, fluid collection, or bowel obstruction. Electronically signed by: Oneil Devonshire MD MD 06/29/2024 01:44 AM EST RP Workstation: [id number]    Assessment & Plan:  .Antibiotic-associated diarrhea Assessment & Plan: Ruling out c dif colitis   Orders: -     C Difficile Quick Screen w PCR reflex; Future  Right lower quadrant abdominal pain Assessment & Plan: Post operative.  Refilling toradol  x 5 days and oxycodone    Hospital discharge follow-up Assessment & Plan: Patient is stable post discharge and has no new issues or questions about discharge plans at the visit today for hospital follow up. All labs , imaging studies and progress notes from admission were reviewed with patient today   medication reconciliation  has been  done.    Other orders -     Ketorolac  Tromethamine ; Take 1 tablet (10 mg total) by mouth every 6 (six) hours as needed.  Dispense: 20 tablet; Refill: 0 -     HYDROcodone -Acetaminophen ; Take 1 tablet by mouth every 6 (six) hours as needed for up to 5 days.  Dispense: 20 tablet; Refill: 0     I spent 34 minutes on the day of this face to face encounter reviewing patient's  most recent visit with cardiology,  nephrology,  and neurology,  prior relevant surgical and non surgical procedures, recent  labs and imaging studies, counseling on weight management,  reviewing the assessment and plan with patient, and post visit ordering and reviewing of  diagnostics and therapeutics with patient  .   Follow-up: No follow-ups on file.   Verneita LITTIE Kettering, MD "

## 2024-06-29 NOTE — ED Notes (Signed)
 AVS provided by edp was reviewed with the pt. Pt verbalized understanding with no additional questions at this time. Pt to go home with friend. Ambulatory to lobby to await ride home

## 2024-06-29 NOTE — ED Notes (Signed)
 Pt provided with ice chips and gingerale soda.

## 2024-06-29 NOTE — Assessment & Plan Note (Signed)
 Ruling out c dif colitis

## 2024-06-29 NOTE — Discharge Instructions (Signed)
 Your lab work, CT scan, urinalysis are reassuring today.   You may alternate over the counter Tylenol  1000 mg every 6 hours as needed for pain, fever and Ibuprofen  800 mg every 6-8 hours as needed for pain, fever.  Please take Ibuprofen  with food.  Do not take more than 4000 mg of Tylenol  (acetaminophen ) in a 24 hour period.  I recommend close follow-up with your surgeon as scheduled.

## 2024-06-29 NOTE — Patient Instructions (Addendum)
 THE KIDNEY STONES SEEN EARLIER ARE TINY AND ARE NOT CAUSING YOUR PAIN .   I am prescribing toradol  and Hhydrocodone for your post operative pain  DO NOT COMBINE TORADOL  WITH ALEVE  OR MOTRIN .    PLEASE COLLECT A  LIQUID STOOL SAMPLE AT HOME IN THE COLLECTING KIT YOU WERE GIVEN AND TAKE IT TO THE LAB IN THE  MEDICAL MALL AT [facility] ASAP  PLEASE START TAKING A PROBIOTIC EVERY DAY FOR THE NEXT 3 WEEKS IF YOU ARE NOT ALREADY TAKING ONE.   YOGURT  IS A PROBIOTIC!

## 2024-06-29 NOTE — Assessment & Plan Note (Signed)
 Patient is stable post discharge and has no new issues or questions about discharge plans at the visit today for hospital follow up. All labs , imaging studies and progress notes from admission were reviewed with patient today   medication reconciliation  has been done.

## 2024-06-29 NOTE — Assessment & Plan Note (Signed)
 Post operative.  Refilling toradol  x 5 days and oxycodone 

## 2024-06-29 NOTE — ED Provider Notes (Signed)
 "  [facility] Provider Note    Event Date/Time   First MD Initiated Contact with Patient 06/28/24 2359     (approximate)   History   Post-op Problem   HPI  Andrea [name] is a 44 y.o. female with history of bipolar disorder, hypertension, diabetes, schizophrenia, kidney stones who presents to the emergency department with complaints of right flank pain that radiates into the right lower abdomen.  Patient underwent appendectomy by Dr. Tye on 06/19/2024 and had a drain placed intraoperatively.  States that she has noticed today that it is no longer draining.  She reports subjective fevers at home.  Having diarrhea.  Also states she was told she had a UTI and has been on Macrobid .  Patient has had previous hysterectomy.  History provided by patient, significant other.    Past Medical History:  Diagnosis Date   Allergy    Anemia    Anxiety    Arthritis    Asthma    Bipolar 1 disorder (HCC)    Bipolar 1 disorder, mixed, severe (HCC) 12/20/2016   Bipolar I disorder, most recent episode depressed (HCC) 12/20/2016   Complication of anesthesia    hard time waking up    Depression    Diabetes mellitus without complication (HCC)    Drug induced akathisia 04/02/2022   GERD (gastroesophageal reflux disease)    no meds   Hypertension    Kidney stones    Long term current use of antipsychotic medication 03/13/2022   Low back pain    Migraines    Neuromuscular disorder (HCC)    PCOS (polycystic ovarian syndrome)    PONV (postoperative nausea and vomiting)    Schizophrenia (HCC)    Seizure (HCC) 10/03/2021   Seizures (HCC) 06/04/2016   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   Shortness of breath dyspnea    with bronchitis    Past Surgical History:  Procedure Laterality Date   ABDOMINAL HYSTERECTOMY N/A 11/14/2015   Procedure: HYSTERECTOMY ABDOMINAL;  Surgeon: Oneil FORBES Piety, MD;  Location: [facility];  Service: Gynecology;  Laterality:  N/A;   ANKLE FUSION Right 05/13/2023   Procedure: ARTHRODESIS ANKLE;  Surgeon: Malvin Marsa FALCON, DPM;  Location: [facility];  Service: Orthopedics/Podiatry;  Laterality: Right;  Hardware removal, right ankle tibiot talo calcaneal arthrodesis   APPENDECTOMY     CHOLECYSTECTOMY     FRACTURE SURGERY     INTRAUTERINE DEVICE (IUD) INSERTION  06/14/2014   [facility]   LYMPH NODES REMOVED     ORIF ANKLE FRACTURE Right 05/03/2023   Procedure: OPEN REDUCTION INTERNAL FIXATION (ORIF) ANKLE FRACTURE;  Surgeon: Silva Juliene SAUNDERS, DPM;  Location: [facility];  Service: Orthopedics/Podiatry;  Laterality: Right;   ovarian cyst removed     SALPINGOOPHORECTOMY Bilateral 11/14/2015   Procedure: SALPINGO OOPHORECTOMY;  Surgeon: Oneil FORBES Piety, MD;  Location: [facility];  Service: Gynecology;  Laterality: Bilateral;   XI ROBOTIC LAPAROSCOPIC ASSISTED APPENDECTOMY N/A 06/19/2024   Procedure: APPENDECTOMY, ROBOT-ASSISTED, LAPAROSCOPIC;  Surgeon: Tye Millet, DO;  Location: [facility];  Service: General;  Laterality: N/A;    MEDICATIONS:  Prior to Admission medications  Medication Sig Start Date End Date Taking? Authorizing Provider  acetaminophen  (TYLENOL ) 325 MG tablet Take 2 tablets (650 mg total) by mouth every 8 (eight) hours as needed for mild pain (pain score 1-3). 06/19/24 07/19/24  Sakai, Isami, DO  Albuterol -Budesonide  (AIRSUPRA) 90-80 MCG/ACT AERO  04/01/23   [provider]  Atogepant  (QULIPTA ) 60 MG  TABS Take 1 tablet daily 06/15/24   Aquino, Karen M, MD  buPROPion  (WELLBUTRIN  XL) 150 MG 24 hr tablet Take 1 tablet (150 mg total) by mouth daily. 05/19/24   Gretel App, NP  busPIRone  (BUSPAR ) 10 MG tablet Take 1 tablet (10 mg total) by mouth 3 (three) times daily. 05/19/24   Gretel App, NP  cephALEXin  (KEFLEX ) 500 MG capsule Take 1 capsule (500 mg total) by mouth 3 (three) times daily for 7 days. 06/22/24 06/29/24  Ernest Ronal BRAVO, MD  cetirizine  (ZYRTEC ) 10 MG tablet Take 10 mg by mouth at  bedtime. 11/05/23   [provider]  clonazePAM  (KLONOPIN ) 0.5 MG tablet Take 1 tablet (0.5 mg total) by mouth 3 (three) times daily as needed for anxiety (sleep). 06/02/24   Gretel App, NP  cyclobenzaprine  (FLEXERIL ) 5 MG tablet Take 1 tablet (5 mg total) by mouth 3 (three) times daily as needed. 06/15/24   Menshew, Candida LULLA Kings, PA-C  docusate sodium  (COLACE) 100 MG capsule Take 1 capsule (100 mg total) by mouth 2 (two) times daily as needed for up to 10 days for mild constipation. 06/19/24 06/29/24  Tye, Isami, DO  DULoxetine  (CYMBALTA ) 60 MG capsule Take 2 capsules (120 mg total) by mouth daily. 10/13/23   Barbra Jayson LABOR, MD  eletriptan  (RELPAX ) 40 MG tablet Take 1 tablet at onset of migraine. May repeat in 2 hours if headache persists or recurs. Do not take more than 3 a week 01/12/24   Georjean Darice HERO, MD  empagliflozin  (JARDIANCE ) 25 MG TABS tablet Take 25 mg by mouth. 05/21/23   [provider]  famotidine  (PEPCID ) 40 MG tablet Take 1 tablet (40 mg total) by mouth 2 (two) times daily. 07/28/23   Kaur, Charanpreet, NP  gabapentin  (NEURONTIN ) 600 MG tablet Take 1 tablet (600 mg total) by mouth 3 (three) times daily. 05/19/24   Gretel App, NP  hydrOXYzine  (VISTARIL ) 50 MG capsule Take 1 capsule (50 mg total) by mouth every 4 (four) hours as needed for anxiety. 05/19/24   Gretel App, NP  ibuprofen  (ADVIL ) 400 MG tablet Take 1 tablet (400 mg total) by mouth every 8 (eight) hours as needed for mild pain (pain score 1-3) or moderate pain (pain score 4-6). 06/19/24   Sakai, Isami, DO  mirabegron  ER (MYRBETRIQ ) 25 MG TB24 tablet Take 25 mg by mouth daily. 05/20/24   [provider]  montelukast  (SINGULAIR ) 10 MG tablet TAKE 1 TABLET BY MOUTH EVERYDAY AT BEDTIME 06/28/24   Gretel App, NP  naloxone  (NARCAN ) nasal spray 4 mg/0.1 mL Place 1 spray into the nose as needed for up to 365 doses (for opioid-induced respiratory depresssion). In case of emergency (overdose), spray once  into each nostril. If no response within 3 minutes, repeat application and call 911. 01/19/24 01/18/25  Tanya Glisson, MD  ondansetron  (ZOFRAN -ODT) 4 MG disintegrating tablet Take 2 tablets (8 mg total) by mouth every 8 (eight) hours as needed for nausea or vomiting. 09/06/23   Corlis Burnard DEL, NP  pantoprazole  (PROTONIX ) 40 MG tablet TAKE 1 TABLET BY MOUTH EVERY DAY IN THE MORNING 01/16/24   Kaur, Charanpreet, NP  potassium chloride  (MICRO-K ) 10 MEQ CR capsule TAKE 1 CAPSULE BY MOUTH EVERY DAY 01/21/24   Gretel App, NP  promethazine  (PHENERGAN ) 25 MG tablet Take 1 tablet (25 mg total) by mouth every 8 (eight) hours as needed for nausea or vomiting. 06/02/24   Gretel App, NP  QUEtiapine  (SEROQUEL ) 100 MG tablet Take 100 mg by  mouth 2 (two) times daily. Take 100 mg by mouth in AM and 200 mg by mouth in PM Patient taking differently: Take 100 mg by mouth at bedtime. Take 100 mg by mouth in AM and 200 mg by mouth in PM 01/15/24   [provider]  SYMBICORT 160-4.5 MCG/ACT inhaler Inhale 2 puffs into the lungs. 08/13/22   [provider]  tamsulosin  (FLOMAX ) 0.4 MG CAPS capsule Take 1 capsule (0.4 mg total) by mouth daily. 06/16/24   Vaillancourt, Samantha, PA-C  traMADol  (ULTRAM ) 50 MG tablet Take 1 tablet (50 mg total) by mouth every 8 (eight) hours as needed. 06/19/24 06/19/25  Tye Millet, DO  traZODone  (DESYREL ) 150 MG tablet TAKE 1 TABLET BY MOUTH AT BEDTIME. 04/27/24   Gretel App, NP  zonisamide  (ZONEGRAN ) 100 MG capsule Take 5 capsules every night 01/12/24   Georjean Darice HERO, MD    Physical Exam   Triage Vital Signs: ED Triage Vitals  Encounter Vitals Group     BP 06/28/24 1954 (!) 123/93     Girls Systolic BP Percentile --      Girls Diastolic BP Percentile --      Boys Systolic BP Percentile --      Boys Diastolic BP Percentile --      Pulse Rate 06/28/24 1952 85     Resp 06/28/24 1952 20     Temp 06/28/24 1952 98.4 F (36.9 C)     Temp Source 06/28/24 1952 Oral     SpO2  06/28/24 1952 98 %     Weight --      Height --      Head Circumference --      Peak Flow --      Pain Score 06/28/24 1950 10     Pain Loc --      Pain Education --      Exclude from Growth Chart --     Most recent vital signs: Vitals:   06/29/24 0230 06/29/24 0418  BP: (!) 156/106 (!) 143/90  Pulse: 83 81  Resp: 19 17  Temp:    SpO2: 94% 94%    CONSTITUTIONAL: Alert, responds appropriately to questions.  Appears uncomfortable HEAD: Normocephalic, atraumatic EYES: Conjunctivae clear, pupils appear equal, sclera nonicteric ENT: normal nose; moist mucous membranes NECK: Supple, normal ROM CARD: RRR; S1 and S2 appreciated RESP: Normal chest excursion without splinting or tachypnea; breath sounds clear and equal bilaterally; no wheezes, no rhonchi, no rales, no hypoxia or respiratory distress, speaking full sentences ABD/GI: Non-distended; soft, tender diffusely, incision sites are clean and dry, drain in the mid abdomen with small amount of serosanguineous fluid in the bulb, no guarding or rebound BACK: The back appears normal EXT: Normal ROM in all joints; no deformity noted, no edema SKIN: Normal color for age and race; warm; no rash on exposed skin NEURO: Moves all extremities equally, normal speech PSYCH: The patient's mood and manner are appropriate.   ED Results / Procedures / Treatments   LABS: (all labs ordered are listed, but only abnormal results are displayed) Labs Reviewed  COMPREHENSIVE METABOLIC PANEL WITH GFR - Abnormal; Notable for the following components:      Result Value   CO2 21 (*)    Glucose, Bld 165 (*)    Alkaline Phosphatase 146 (*)    All other components within normal limits  URINALYSIS, ROUTINE W REFLEX MICROSCOPIC - Abnormal; Notable for the following components:   Color, Urine YELLOW (*)    APPearance  CLEAR (*)    Specific Gravity, Urine 1.040 (*)    Glucose, UA >=500 (*)    All other components within normal limits  LIPASE, BLOOD  CBC      EKG:  EKG Interpretation Date/Time:    Ventricular Rate:    PR Interval:    QRS Duration:    QT Interval:    QTC Calculation:   R Axis:      Text Interpretation:           RADIOLOGY: My personal review and interpretation of imaging: CT scan shows no acute abnormality.  I have personally reviewed all radiology reports.   CT ABDOMEN PELVIS W CONTRAST Result Date: 06/29/2024 EXAM: CT ABDOMEN AND PELVIS WITH CONTRAST 06/29/2024 01:34:06 AM TECHNIQUE: CT of the abdomen and pelvis was performed with the administration of 100 ml Omnipaque  350 intravenous contrast. Multiplanar reformatted images are provided for review. Automated exposure control, iterative reconstruction, and/or weight-based adjustment of the mA/kV was utilized to reduce the radiation dose to as low as reasonably achievable. COMPARISON: None available. CLINICAL HISTORY: Abdominal pain following prior appendectomy. FINDINGS: LOWER CHEST: No acute abnormality. LIVER: The liver is unremarkable. GALLBLADDER AND BILE DUCTS: The gallbladder has been surgically removed. No biliary ductal dilatation. SPLEEN: No acute abnormality. PANCREAS: No acute abnormality. ADRENAL GLANDS: No acute abnormality. KIDNEYS, URETERS AND BLADDER: Tiny nonobstructing left renal stones are seen. No hydronephrosis. No perinephric or periureteral stranding. The bladder is within normal limits. GI AND BOWEL: Stomach and small bowel are within normal limits. No obstructive or inflammatory changes of the colon are seen. The appendix is not visualized, consistent with the prior surgical history. There is no bowel obstruction. PERITONEUM AND RETROPERITONEUM: No ascites. No free air. No postoperative fluid collection is identified. Surgical drain is seen in place. VASCULATURE: Aorta is normal in caliber. LYMPH NODES: No lymphadenopathy. REPRODUCTIVE ORGANS: The uterus has been surgically removed. No acute abnormality of the remaining reproductive organs. BONES  AND SOFT TISSUES: No acute osseous abnormality is seen. Changes are noted in the abdominal wall related to the recent robotic surgery. IMPRESSION: 1. No CT evidence of postoperative complication following appendectomy, including no abscess, fluid collection, or bowel obstruction. Electronically signed by: Oneil Devonshire MD MD 06/29/2024 01:44 AM EST RP Workstation: [id number]     PROCEDURES:  Critical Care performed: No    Procedures    IMPRESSION / MDM / ASSESSMENT AND PLAN / ED COURSE  I reviewed the triage vital signs and the nursing notes.    Patient here with right sided flank pain and lower abdominal pain.  Recently underwent appendectomy.  The patient is on the cardiac monitor to evaluate for evidence of arrhythmia and/or significant heart rate changes.   DIFFERENTIAL DIAGNOSIS (includes but not limited to):   Kidney stone, pyelonephritis, colitis, diverticulitis postoperative complication such as bowel obstruction, ileus, abscess   Patient's presentation is most consistent with acute presentation with potential threat to life or bodily function.   PLAN: Will obtain labs, urine, CT of the abdomen pelvis.  Will give IV fluids, pain and nausea medicine.   MEDICATIONS GIVEN IN ED: Medications  sodium chloride  0.9 % bolus 1,000 mL (0 mLs Intravenous Stopped 06/29/24 0317)  morphine  (PF) 4 MG/ML injection 4 mg (4 mg Intravenous Given 06/29/24 0052)  ondansetron  (ZOFRAN ) injection 4 mg (4 mg Intravenous Given 06/29/24 0052)  iohexol  (OMNIPAQUE ) 350 MG/ML injection 100 mL (100 mLs Intravenous Contrast Given 06/29/24 0121)  HYDROmorphone  (DILAUDID ) injection 1 mg (1 mg Intravenous Given  06/29/24 0203)  ketorolac  (TORADOL ) 30 MG/ML injection 30 mg (30 mg Intravenous Given 06/29/24 0202)     ED COURSE: Labs show normal electrolytes, LFTs and lipase.  No leukocytosis.  Urine shows no sign of infection.  CT scan reviewed and interpreted by myself and the radiologist and shows no  acute abnormality.  Patient feeling better, tolerating p.o.  Has follow-up with both her general surgeon and PCP tomorrow.  Encouraged her to keep these appointments.  Recommended bland diet, will discharge with Bentyl , Zofran .  Discussed return precautions.  She is comfortable with this plan.   At this time, I do not feel there is any life-threatening condition present. I reviewed all nursing notes, vitals, pertinent previous records.  All lab and urine results, EKGs, imaging ordered have been independently reviewed and interpreted by myself.  I reviewed all available radiology reports from any imaging ordered this visit.  Based on my assessment, I feel the patient is safe to be discharged home without further emergent workup and can continue workup as an outpatient as needed. Discussed all findings, treatment plan as well as usual and customary return precautions.  They verbalize understanding and are comfortable with this plan.  Outpatient follow-up has been provided as needed.  All questions have been answered.    CONSULTS:  none   OUTSIDE RECORDS REVIEWED: Reviewed recent surgical notes.       FINAL CLINICAL IMPRESSION(S) / ED DIAGNOSES   Final diagnoses:  Right lower quadrant abdominal pain     Rx / DC Orders   ED Discharge Orders          Ordered    dicyclomine  (BENTYL ) 20 MG tablet  Every 8 hours PRN        06/29/24 0357    ondansetron  (ZOFRAN -ODT) 4 MG disintegrating tablet  Every 6 hours PRN        06/29/24 0357             Note:  This document was prepared using Dragon voice recognition software and may include unintentional dictation errors.   Jennice Renegar, [name], DO 06/29/24 0505  "

## 2024-07-02 LAB — C DIFFICILE QUICK SCREEN W PCR REFLEX
C Diff antigen: NEGATIVE
C Diff interpretation: NOT DETECTED
C Diff toxin: NEGATIVE

## 2024-07-03 ENCOUNTER — Ambulatory Visit: Payer: Self-pay | Admitting: Internal Medicine

## 2024-07-05 ENCOUNTER — Ambulatory Visit: Payer: MEDICAID | Admitting: Pain Medicine

## 2024-07-07 ENCOUNTER — Ambulatory Visit: Payer: MEDICAID | Admitting: Neurology

## 2024-07-07 ENCOUNTER — Ambulatory Visit: Payer: MEDICAID | Admitting: Physician Assistant

## 2024-07-07 ENCOUNTER — Encounter: Payer: Self-pay | Admitting: Neurology

## 2024-07-07 VITALS — BP 128/90 | HR 91 | Ht 71.0 in | Wt 270.0 lb

## 2024-07-07 VITALS — BP 119/87 | HR 89 | Ht 71.0 in | Wt 211.0 lb

## 2024-07-07 DIAGNOSIS — G43009 Migraine without aura, not intractable, without status migrainosus: Secondary | ICD-10-CM | POA: Diagnosis not present

## 2024-07-07 DIAGNOSIS — M542 Cervicalgia: Secondary | ICD-10-CM | POA: Diagnosis not present

## 2024-07-07 DIAGNOSIS — N200 Calculus of kidney: Secondary | ICD-10-CM

## 2024-07-07 DIAGNOSIS — N390 Urinary tract infection, site not specified: Secondary | ICD-10-CM | POA: Diagnosis not present

## 2024-07-07 DIAGNOSIS — F445 Conversion disorder with seizures or convulsions: Secondary | ICD-10-CM

## 2024-07-07 DIAGNOSIS — R3129 Other microscopic hematuria: Secondary | ICD-10-CM | POA: Diagnosis not present

## 2024-07-07 LAB — URINALYSIS, COMPLETE
Bilirubin, UA: NEGATIVE
Ketones, UA: NEGATIVE
Nitrite, UA: NEGATIVE
Protein,UA: NEGATIVE
Specific Gravity, UA: 1.03 (ref 1.005–1.030)
Urobilinogen, Ur: 0.2 mg/dL (ref 0.2–1.0)
pH, UA: 5.5 (ref 5.0–7.5)

## 2024-07-07 LAB — MICROSCOPIC EXAMINATION

## 2024-07-07 MED ORDER — ESTRADIOL 0.01 % VA CREA: TOPICAL_CREAM | VAGINAL | 12 refills | Status: AC

## 2024-07-07 MED ORDER — ZONISAMIDE 100 MG PO CAPS: ORAL_CAPSULE | ORAL | 3 refills | Status: AC

## 2024-07-07 MED ORDER — METHENAMINE HIPPURATE 1 G PO TABS: 1.0000 g | ORAL_TABLET | Freq: Two times a day (BID) | ORAL | 11 refills | Status: AC

## 2024-07-07 NOTE — Patient Instructions (Signed)
 Good to see you doing better!  Continue all your medications  2. Let me know where your PT will be done so we can add on PT for neck/back pain  3. Follow-up in 6 months, call for any changes

## 2024-07-07 NOTE — Progress Notes (Signed)
 "  07/07/2024 10:39 AM   Harlene JAYSON Glatter 03/01/1981 [id number]  CC: Chief Complaint  Patient presents with   Recurrent UTI   Results   Follow-up   HPI: ALBANY WINSLOW is a 44 y.o. female with PMH diabetes on Jardiance , nephrolithiasis, recurrent MDR E. coli UTI, microscopic hematuria, seizures on zonisamide , and chronic pelvic pain/endometriosis s/p hysterectomy and salpingo-oophorectomy in 2017 who presents today for follow-up with CT results.   I saw her as a new patient on 06/16/2024 with reports of recurrent MDR E. coli UTIs x 3 months.  CT stone study showed a punctate nonobstructing right upper pole stone and multiple nonobstructing left renal stones measuring up to 3 mm.  Incidentally, there was evidence of acute appendicitis and she ultimately underwent appendectomy.  Today she reports some incision site stinging/burning from her recent appendectomy.  She has been on Jardiance  for 5 to 6 years, and her recurrent UTIs only started within the last several months.  She is asymptomatic of infection today.  Dr. Lovetta recently started her on HRT with patches.  She does report vaginal dryness.  In-office UA today positive for 3+ glucose, trace intact blood, and trace leukocytes; urine microscopy with 11-30 WBCs/HPF and moderate bacteria.  PMH: Past Medical History:  Diagnosis Date   Allergy    Anemia    Anxiety    Arthritis    Asthma    Bipolar 1 disorder (HCC)    Bipolar 1 disorder, mixed, severe (HCC) 12/20/2016   Bipolar I disorder, most recent episode depressed (HCC) 12/20/2016   Complication of anesthesia    hard time waking up    Depression    Diabetes mellitus without complication (HCC)    Drug induced akathisia 04/02/2022   GERD (gastroesophageal reflux disease)    no meds   Hypertension    Kidney stones    Long term current use of antipsychotic medication 03/13/2022   Low back pain    Migraines    Neuromuscular disorder (HCC)    PCOS (polycystic  ovarian syndrome)    PONV (postoperative nausea and vomiting)    Schizophrenia (HCC)    Seizure (HCC) 10/03/2021   Seizures (HCC) 06/04/2016   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   Shortness of breath dyspnea    with bronchitis    Surgical History: Past Surgical History:  Procedure Laterality Date   ABDOMINAL HYSTERECTOMY N/A 11/14/2015   Procedure: HYSTERECTOMY ABDOMINAL;  Surgeon: Oneil FORBES Piety, MD;  Location: [facility];  Service: Gynecology;  Laterality: N/A;   ANKLE FUSION Right 05/13/2023   Procedure: ARTHRODESIS ANKLE;  Surgeon: Malvin Marsa FALCON, DPM;  Location: [facility];  Service: Orthopedics/Podiatry;  Laterality: Right;  Hardware removal, right ankle tibiot talo calcaneal arthrodesis   APPENDECTOMY     CHOLECYSTECTOMY     FRACTURE SURGERY     INTRAUTERINE DEVICE (IUD) INSERTION  06/14/2014   [facility]   LYMPH NODES REMOVED     ORIF ANKLE FRACTURE Right 05/03/2023   Procedure: OPEN REDUCTION INTERNAL FIXATION (ORIF) ANKLE FRACTURE;  Surgeon: Silva Juliene SAUNDERS, DPM;  Location: [facility];  Service: Orthopedics/Podiatry;  Laterality: Right;   ovarian cyst removed     SALPINGOOPHORECTOMY Bilateral 11/14/2015   Procedure: SALPINGO OOPHORECTOMY;  Surgeon: Oneil FORBES Piety, MD;  Location: [facility];  Service: Gynecology;  Laterality: Bilateral;   XI ROBOTIC LAPAROSCOPIC ASSISTED APPENDECTOMY N/A 06/19/2024   Procedure: APPENDECTOMY, ROBOT-ASSISTED, LAPAROSCOPIC;  Surgeon: Tye Millet, DO;  Location: [facility];  Service: General;  Laterality: N/A;    Home Medications:  Allergies as of 07/07/2024       Reactions   Bactrim [sulfamethoxazole -trimethoprim] Hives, Itching   Codeine Hives, Itching   Sulfa Antibiotics Itching        Medication List        Accurate as of July 07, 2024 10:39 AM. If you have any questions, ask your nurse or doctor.          Airsupra 90-80 MCG/ACT Aero Generic drug: Albuterol -Budesonide    buPROPion  300 MG 24  hr tablet Commonly known as: WELLBUTRIN  XL Take 300 mg by mouth every morning.   busPIRone  10 MG tablet Commonly known as: BUSPAR  Take 1 tablet (10 mg total) by mouth 3 (three) times daily.   cetirizine  10 MG tablet Commonly known as: ZYRTEC  Take 10 mg by mouth at bedtime.   clonazePAM  0.5 MG tablet Commonly known as: KlonoPIN  Take 1 tablet (0.5 mg total) by mouth 3 (three) times daily as needed for anxiety (sleep).   dicyclomine  20 MG tablet Commonly known as: BENTYL  Take 1 tablet (20 mg total) by mouth every 8 (eight) hours as needed.   DULoxetine  60 MG capsule Commonly known as: CYMBALTA  Take 2 capsules (120 mg total) by mouth daily.   eletriptan  40 MG tablet Commonly known as: Relpax  Take 1 tablet at onset of migraine. May repeat in 2 hours if headache persists or recurs. Do not take more than 3 a week   empagliflozin  25 MG Tabs tablet Commonly known as: JARDIANCE  Take 25 mg by mouth.   famotidine  40 MG tablet Commonly known as: PEPCID  Take 1 tablet (40 mg total) by mouth 2 (two) times daily.   gabapentin  600 MG tablet Commonly known as: NEURONTIN  Take 1 tablet (600 mg total) by mouth 3 (three) times daily.   hydrOXYzine  50 MG capsule Commonly known as: VISTARIL  Take 1 capsule (50 mg total) by mouth every 4 (four) hours as needed for anxiety.   hydrOXYzine  50 MG tablet Commonly known as: ATARAX  Take by mouth 2 (two) times daily.   ketorolac  10 MG tablet Commonly known as: TORADOL  Take 1 tablet (10 mg total) by mouth every 6 (six) hours as needed.   mirabegron  ER 25 MG Tb24 tablet Commonly known as: MYRBETRIQ  Take 25 mg by mouth daily.   montelukast  10 MG tablet Commonly known as: SINGULAIR  TAKE 1 TABLET BY MOUTH EVERYDAY AT BEDTIME   naloxone  4 MG/0.1ML Liqd nasal spray kit Commonly known as: NARCAN  Place 1 spray into the nose as needed for up to 365 doses (for opioid-induced respiratory depresssion). In case of emergency (overdose), spray once into  each nostril. If no response within 3 minutes, repeat application and call 911.   nitrofurantoin  (macrocrystal-monohydrate) 100 MG capsule Commonly known as: MACROBID  Take 100 mg by mouth 2 (two) times daily.   ondansetron  4 MG disintegrating tablet Commonly known as: ZOFRAN -ODT Take 1 tablet (4 mg total) by mouth every 6 (six) hours as needed for nausea or vomiting.   pantoprazole  40 MG tablet Commonly known as: PROTONIX  TAKE 1 TABLET BY MOUTH EVERY DAY IN THE MORNING   potassium chloride  10 MEQ CR capsule Commonly known as: MICRO-K  TAKE 1 CAPSULE BY MOUTH EVERY DAY   promethazine  25 MG tablet Commonly known as: PHENERGAN  Take 1 tablet (25 mg total) by mouth every 8 (eight) hours as needed for nausea or vomiting.   QUEtiapine  200 MG tablet Commonly known as: SEROQUEL  Take 200 mg by mouth at bedtime.  Qulipta  60 MG Tabs Generic drug: Atogepant  Take 1 tablet daily   Symbicort 160-4.5 MCG/ACT inhaler Generic drug: budesonide -formoterol  Inhale 2 puffs into the lungs.   tamsulosin  0.4 MG Caps capsule Commonly known as: FLOMAX  Take 1 capsule (0.4 mg total) by mouth daily.   traZODone  150 MG tablet Commonly known as: DESYREL  TAKE 1 TABLET BY MOUTH AT BEDTIME.   zonisamide  100 MG capsule Commonly known as: ZONEGRAN  Take 5 capsules every night        Allergies:  Allergies[1]  Family History: Family History  Problem Relation Age of Onset   Miscarriages / Stillbirths Mother    Hypertension Mother    Heart disease Mother    Hypertension Father    Healthy Sister    Depression Maternal Aunt    Depression Maternal Aunt    Diabetes Maternal Aunt    Breast cancer Maternal Grandmother    Healthy Brother     Social History:   reports that she has never smoked. She has never used smokeless tobacco. She reports that she does not drink alcohol  and does not use drugs.  Physical Exam: BP 119/87   Pulse 89   Ht 5' 11 (1.803 m)   Wt 211 lb (95.7 kg)   LMP  (LMP  Unknown)   SpO2 96%   BMI 29.43 kg/m   Constitutional:  Alert and oriented, no acute distress, nontoxic appearing HEENT: [place], AT Cardiovascular: No clubbing, cyanosis, or edema Respiratory: Normal respiratory effort, no increased work of breathing Skin: No rashes, bruises or suspicious lesions Neurologic: Grossly intact, no focal deficits, moving all 4 extremities Psychiatric: Normal mood and affect  Laboratory Data: Results for orders placed or performed in visit on 07/07/24  Microscopic Examination   Collection Time: 07/07/24 10:57 AM   Urine  Result Value Ref Range   WBC, UA 11-30 (A) 0 - 5 /hpf   RBC, Urine 0-2 0 - 2 /hpf   Epithelial Cells (non renal) 0-10 0 - 10 /hpf   Bacteria, UA Moderate (A) None seen/Few   Yeast, UA Present (A) None seen  Urinalysis, Complete   Collection Time: 07/07/24 10:57 AM  Result Value Ref Range   Specific Gravity, UA 1.030 1.005 - 1.030   pH, UA 5.5 5.0 - 7.5   Color, UA Yellow Yellow   Appearance Ur Cloudy (A) Clear   Leukocytes,UA Trace (A) Negative   Protein,UA Negative Negative/Trace   Glucose, UA 3+ (A) Negative   Ketones, UA Negative Negative   RBC, UA Trace (A) Negative   Bilirubin, UA Negative Negative   Urobilinogen, Ur 0.2 0.2 - 1.0 mg/dL   Nitrite, UA Negative Negative   Microscopic Examination See below:    Assessment & Plan:   1. Recurrent UTI (Primary) Asymptomatic today.  Will start estrogen cream for UTI prevention and we also discussed UTI prevention strategies including p.o. hydration, cranberry supplements with or without d-mannose, and lactobacillus containing probiotics.  I also offered her preventative Hiprex  and she would like to try this. - Urinalysis, Complete - estradiol  (ESTRACE ) 0.01 % CREA vaginal cream; Apply one pea-sized amount around the opening of the urethra daily for 2 weeks, then 3 times weekly moving forward.  Dispense: 42.5 g; Refill: 12 - methenamine  (HIPREX ) 1 g tablet; Take 1 tablet (1 g total) by  mouth 2 (two) times daily with a meal.  Dispense: 60 tablet; Refill: 11  2. Nephrolithiasis Multiple small, nonobstructing renal stones, not the source of her pain.  I did not recommend  treating these given small size, though with her zonisamide  use I do think we should watch these closely.  3. Microscopic hematuria Recent microscopic hematuria with benign UAs.  I offered her cystoscopy for further evaluation and she agreed.  Return for Cystoscopy.  Lucie Hones, PA-C  [facility] [place] [address], [address] [place], [place] [address] [phone]      [1]  Allergies Allergen Reactions   Bactrim [Sulfamethoxazole -Trimethoprim] Hives and Itching   Codeine Hives and Itching   Sulfa Antibiotics Itching   "

## 2024-07-07 NOTE — Progress Notes (Signed)
 "  NEUROLOGY FOLLOW UP OFFICE NOTE  Andrea [name] [id number] June 25, 1980  Discussed the use of AI scribe software for clinical note transcription with the patient, who gave verbal consent to proceed.  History of Present Illness I had the pleasure of seeing Andrea [name] in follow-up in the neurology clinic on 07/07/2024.  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 She is again  accompanied by her husband who helps supplement the history today.  Records and images were personally reviewed where available.  She sent a MyChart message a month ago that migraines had worsened, she was having them 4-5 times a week straight in a row. Qulipta  increased to 60mg  daily. She is also on Zonisamide  500mg  at bedtime for migraine prophylaxis. Migraines have improved significantly since increasing the dose, she has migraines down to two to three times a week. She experiences neck pain that radiates to her lower back. An MRI of the lumbar spine in November showed mild arthritis changes and a mild disc bulge, but no significant findings. The pain does not affect her arms and started without any specific incident. She was having frequent falls on last visit but is happy to report there have been no recent falls. She has some shakiness from her foot, she had 2 surgeries recently. She plans to start PT soon. Additionally, she had an appendectomy earlier this month, which has improved her condition.  She is happy to report she has not had any seizures since last visit.    History on Initial Assessment 08/08/2021: This is a pleasant 44 year old right-handed woman with a history of hypertension, DM, migraines, bipolar disorder, presenting for evaluation of new onset seizures. She is accompanied by her husband who helps supplement the history today. She was in her usual state of health until 07/31/21 while at work at [employer], she started feeling a little funny and sat down, then woke up in the  hospital. Co-workers reported she got went, went pale white, then slid down to the floor and had a 4-minute episode of jerking with EMS noting post-ictal confusion. No tongue bite or incontinence. In the ER, her husband reports she had 2 more seizures, but ER notes indicate there was an episode of upper body jerking including head jerking lasting 15-20 seconds. Bloodwork showed creatinine 1.21, glucose 265. I personally reviewed MRI brain with and without contrast which was normal, hippocampi symmetric with no abnormal signal or enhancement seen. Her wake and drowsy EEG was normal. She had been on Gabapentin  for anxiety, dose was increased to 300mg  TID. She feels her left side has been weaker since then, this has gotten better. Since hospital discharge, her husband reports more seizures. She had an unwitnessed one on 2/16 where she recalls going to the bathroom and coming out, then waking up on the ground, no injuries/tongue bite/incontinence. There was no prior warning. On 2/17, after eating a snack, she told her husband she felt funny and lay down, she then had another shaking episode lasting less than a minute. The last episode was on 2/18, they were dozing in bed, he felt her shaking and saw her lying on her left side. He thinks her eyes were closed, mouth open, it lasted less than 30 seconds, she woke up, then had another 30-second shaking episode. She had no memory of it and told her husband her head hurt and she felt very tired. Her husband denies any staring episodes. She denies any olfactory/gustatory hallucinations, deja  vu, rising epigastric sensation, focal numbness/tingling. She has occasional jerks in her legs. She used to have migraines that quieted down. She used to take Topamax  but it interacted with one of her medications. Since the initial seizure, she has had frontal throbbing headaches that recur, different from past headaches. She is sensitive to lights/sounds, no nausea/vomiting. Tylenol   helps sometimes but lately has not, she has been taking 2 650mg  tabs three times a day for the past week. She denies any diplopia, dysarthria/dysphagia, neck/back pain, bowel/bladder dysfunction. Her husband tried to check her glucose level with the last seizure, it was 271. She denies any sleep deprivation, she gets 6-8 hours of sleep with Doxepin . No recent change in stress levels. She is on Zyprexa  for mood, mood is good. She is not having a lot of anxiety anymore. She lives with her husband and works as a insurance underwriter at [employer]. No alcohol  use. She had a normal birth and early development.  There is no history of febrile convulsions, CNS infections such as meningitis/encephalitis, significant traumatic brain injury, neurosurgical procedures, or family history of seizures.  Update 01/16/2022: Since her last visit, she was admitted for EMU monitoring from June 12-16, 2023 where Gabapentin  and Zonisamide  were held. Baseline EEG was normal, typical events were not captured. Diagnosis of migraine with aura versus nonepileptic events were discussed with them. She did well event-free for 3 months until 7/29 when she had 2 seizures followed by chest pressure, so they went to the ER. BP was 95/56, EKG showed sinus rhythm, troponin negative. Since then, she has had 2 on 7/31, and another 1.5 hours ago. She feels really tired. No tongue bite or incontinence. Her husband shows 2 videos of her typical events. During one, she is lying on the bed with eyes closed, head bobbing up and down with irregular upper body movements, heavy breathing, unresponsive to husband afterwards. Another video shows her with her head bent down, her husband notes she started staring, then head slumped down and she started having side to side head movements as he lay her on the bed with upper body rocking, eyes closed.   Prior ASMs: Topiramate  Prior migraine preventative medications: Topiramate , Emgality , Qulipta  30mg  Prior  migraine rescue medications: sumatriptan , Relpax , Aimovig    PAST MEDICAL HISTORY: Past Medical History:  Diagnosis Date   Allergy    Anemia    Anxiety    Arthritis    Asthma    Bipolar 1 disorder (HCC)    Bipolar 1 disorder, mixed, severe (HCC) 12/20/2016   Bipolar I disorder, most recent episode depressed (HCC) 12/20/2016   Complication of anesthesia    hard time waking up    Depression    Diabetes mellitus without complication (HCC)    Drug induced akathisia 04/02/2022   GERD (gastroesophageal reflux disease)    no meds   Hypertension    Kidney stones    Long term current use of antipsychotic medication 03/13/2022   Low back pain    Migraines    Neuromuscular disorder (HCC)    PCOS (polycystic ovarian syndrome)    PONV (postoperative nausea and vomiting)    Schizophrenia (HCC)    Seizure (HCC) 10/03/2021   Seizures (HCC) 06/04/2016   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   Shortness of breath dyspnea    with bronchitis    MEDICATIONS: Medications Ordered Prior to Encounter[1]  ALLERGIES: Allergies[2]  FAMILY HISTORY: Family History  Problem Relation Age of Onset   Miscarriages /  Stillbirths Mother    Hypertension Mother    Heart disease Mother    Hypertension Father    Healthy Sister    Depression Maternal Aunt    Depression Maternal Aunt    Diabetes Maternal Aunt    Breast cancer Maternal Grandmother    Healthy Brother     SOCIAL HISTORY: Social History   Socioeconomic History   Marital status: Divorced    Spouse name: Not on file   Number of children: 0   Years of education: 12   Highest education level: Some college, no degree  Occupational History   Not on file  Tobacco Use   Smoking status: Never   Smokeless tobacco: Never  Vaping Use   Vaping status: Never Used  Substance and Sexual Activity   Alcohol  use: Never   Drug use: Never   Sexual activity: Yes    Partners: Male  Other Topics Concern   Not on file  Social  History Narrative   Right handed   Drinks caffeine   One story home   Social Drivers of Health   Tobacco Use: Low Risk  (06/29/2024)   Received from [facility]   Patient History    Smoking Tobacco Use: Never    Smokeless Tobacco Use: Never    Passive Exposure: Never  Financial Resource Strain: Medium Risk (05/20/2024)   Received from [facility]   Overall Financial Resource Strain (CARDIA)    Difficulty of Paying Living Expenses: Somewhat hard  Food Insecurity: No Food Insecurity (06/19/2024)   Epic    Worried About Radiation Protection Practitioner of Food in the Last Year: Never true    Ran Out of Food in the Last Year: Never true  Transportation Needs: No Transportation Needs (06/19/2024)   Epic    Lack of Transportation (Medical): No    Lack of Transportation (Non-Medical): No  Recent Concern: Transportation Needs - Unmet Transportation Needs (05/20/2024)   Received from [facility] - Transportation    In the past 12 months, has lack of transportation kept you from medical appointments or from getting medications?: Yes    Lack of Transportation (Non-Medical): No  Physical Activity: Inactive (04/19/2024)   Exercise Vital Sign    Days of Exercise per Week: 0 days    Minutes of Exercise per Session: Not on file  Stress: Stress Concern Present (04/19/2024)   [employer] of Occupational [employer] - Occupational Stress Questionnaire    Feeling of Stress: Very much  Social Connections: Socially Integrated (04/19/2024)   Social Connection and Isolation Panel    Frequency of Communication with Friends and Family: More than three times a week    Frequency of Social Gatherings with Friends and Family: Once a week    Attends Religious Services: More than 4 times per year    Active Member of Clubs or Organizations: Yes    Attends Banker Meetings: More than 4 times per year    Marital Status: Living with partner  Intimate Partner  Violence: Not At Risk (06/19/2024)   Epic    Fear of Current or Ex-Partner: No    Emotionally Abused: No    Physically Abused: No    Sexually Abused: No  Depression (PHQ2-9): Medium Risk (06/29/2024)   Depression (PHQ2-9)    PHQ-2 Score: 5  Alcohol  Screen: Low Risk (04/19/2024)   Alcohol  Screen    Last Alcohol  Screening Score (AUDIT): 0  Housing: High Risk (06/29/2024)  [facility]   Epic    In the last 12 months, was there a time when you were not able to pay the mortgage or rent on time?: Yes    In the past 12 months, how many times have you moved where you were living?: 1    At any time in the past 12 months, were you homeless or living in a shelter (including now)?: No  Utilities: Not At Risk (06/19/2024)   Epic    Threatened with loss of utilities: No  Health Literacy: Not on file     PHYSICAL EXAM: Vitals:   07/07/24 1254  BP: (!) 128/90  Pulse: 91  SpO2: 96%   General: No acute distress Head:  Normocephalic/atraumatic Skin/Extremities: No rash, no edema Neurological Exam: alert and awake. No aphasia or dysarthria. Fund of knowledge is appropriate.  Attention and concentration are normal.   Cranial nerves: Pupils equal, round. Extraocular movements intact with no nystagmus. Visual fields full.  No facial asymmetry.  Motor: Bulk and tone normal, muscle strength 5/5 throughout with no pronator drift. Reflexes +1 throughout.  Finger to nose testing intact.  Gait slow and cautious limping with right foot.    IMPRESSION: This is a pleasant 44 yo RH woman with a history of hypertension, DM, migraines, bipolar disorder, with functional seizures (psychogenic non-epileptic events). No seizures since last visit. Migraine improved on higher dose Qulipta  60mg  daily, she is also on Zonisamide  500mg  at bedtime for migraine prophylaxis. She is reporting a lot of neck/back pain and would benefit from PT which we can add on when she starts foot PT. She is aware  of [place] driving laws to stop driving after an episode of loss of awareness until 6 months event-free. Follow-up in 6 months, call for any changes.    Thank you for allowing me to participate in her care.  Please do not hesitate to call for any questions or concerns.    Darice Shivers, M.D.   CC: Leron Glance, NP      [1]  Current Outpatient Medications on File Prior to Visit  Medication Sig Dispense Refill   Albuterol -Budesonide  (AIRSUPRA) 90-80 MCG/ACT AERO      Atogepant  (QULIPTA ) 60 MG TABS Take 1 tablet daily 90 tablet 3   buPROPion  (WELLBUTRIN  XL) 300 MG 24 hr tablet Take 300 mg by mouth every morning.     busPIRone  (BUSPAR ) 10 MG tablet Take 1 tablet (10 mg total) by mouth 3 (three) times daily. 90 tablet 2   cetirizine  (ZYRTEC ) 10 MG tablet Take 10 mg by mouth at bedtime.     clonazePAM  (KLONOPIN ) 0.5 MG tablet Take 1 tablet (0.5 mg total) by mouth 3 (three) times daily as needed for anxiety (sleep). 90 tablet 2   dicyclomine  (BENTYL ) 20 MG tablet Take 1 tablet (20 mg total) by mouth every 8 (eight) hours as needed. 15 tablet 0   DULoxetine  (CYMBALTA ) 60 MG capsule Take 2 capsules (120 mg total) by mouth daily. 180 capsule 0   eletriptan  (RELPAX ) 40 MG tablet Take 1 tablet at onset of migraine. May repeat in 2 hours if headache persists or recurs. Do not take more than 3 a week 10 tablet 5   empagliflozin  (JARDIANCE ) 25 MG TABS tablet Take 25 mg by mouth.     famotidine  (PEPCID ) 40 MG tablet Take 1 tablet (40 mg total) by mouth 2 (two) times daily. 180 tablet 0   gabapentin  (NEURONTIN ) 600 MG tablet Take 1  tablet (600 mg total) by mouth 3 (three) times daily. 90 tablet 2   hydrOXYzine  (ATARAX ) 50 MG tablet Take by mouth 2 (two) times daily.     hydrOXYzine  (VISTARIL ) 50 MG capsule Take 1 capsule (50 mg total) by mouth every 4 (four) hours as needed for anxiety. 180 capsule 2   ketorolac  (TORADOL ) 10 MG tablet Take 1 tablet (10 mg total) by mouth every 6 (six) hours as needed. 20  tablet 0   mirabegron  ER (MYRBETRIQ ) 25 MG TB24 tablet Take 25 mg by mouth daily.     montelukast  (SINGULAIR ) 10 MG tablet TAKE 1 TABLET BY MOUTH EVERYDAY AT BEDTIME 90 tablet 3   naloxone  (NARCAN ) nasal spray 4 mg/0.1 mL Place 1 spray into the nose as needed for up to 365 doses (for opioid-induced respiratory depresssion). In case of emergency (overdose), spray once into each nostril. If no response within 3 minutes, repeat application and call 911. 1 each 1   nitrofurantoin , macrocrystal-monohydrate, (MACROBID ) 100 MG capsule Take 100 mg by mouth 2 (two) times daily.     ondansetron  (ZOFRAN -ODT) 4 MG disintegrating tablet Take 1 tablet (4 mg total) by mouth every 6 (six) hours as needed for nausea or vomiting. 20 tablet 0   pantoprazole  (PROTONIX ) 40 MG tablet TAKE 1 TABLET BY MOUTH EVERY DAY IN THE MORNING 90 tablet 1   potassium chloride  (MICRO-K ) 10 MEQ CR capsule TAKE 1 CAPSULE BY MOUTH EVERY DAY 90 capsule 3   promethazine  (PHENERGAN ) 25 MG tablet Take 1 tablet (25 mg total) by mouth every 8 (eight) hours as needed for nausea or vomiting. 30 tablet 0   QUEtiapine  (SEROQUEL ) 200 MG tablet Take 200 mg by mouth at bedtime.     SYMBICORT 160-4.5 MCG/ACT inhaler Inhale 2 puffs into the lungs.     tamsulosin  (FLOMAX ) 0.4 MG CAPS capsule Take 1 capsule (0.4 mg total) by mouth daily. 30 capsule 0   traZODone  (DESYREL ) 150 MG tablet TAKE 1 TABLET BY MOUTH AT BEDTIME. 90 tablet 3   zonisamide  (ZONEGRAN ) 100 MG capsule Take 5 capsules every night 150 capsule 11   No current facility-administered medications on file prior to visit.  [2]  Allergies Allergen Reactions   Bactrim [Sulfamethoxazole -Trimethoprim] Hives and Itching   Codeine Hives and Itching   Sulfa Antibiotics Itching   "

## 2024-07-07 NOTE — Progress Notes (Incomplete)
 "  NEUROLOGY FOLLOW UP OFFICE NOTE  Andrea [name] [id number] 05/03/1981  HISTORY OF PRESENT ILLNESS: I had the pleasure of seeing Andrea [name] in follow-up in the neurology clinic on 07/07/2024.  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  and is accompanied by *** today.  Records and images were personally reviewed where available.  ***.   I had the pleasure of seeing Andrea [name] in follow-up in the neurology clinic on 01/12/2027.  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 She is again accompanied by her fiance [name] who helps supplement the history today.  Records and images were personally reviewed where available.  She contacted our office in 02/2023 of worsened migraines despite Emgality , we discussed starting Qulipta  which was approved in 03/2023. She contacted our office in 07/2023 about an increase in migraines, with 3-4 days of migraines in a 2-week period. She had broken her ankle in November and had 2 major surgeries which were making her miserable. Sumatriptan  was not working, Relpax  was sent in. She continues on Zonisamide  500mg  at bedtime for migraine prophylaxis.  Since her last visit, she reports that she was taking both the Emgality  and Qulipta , last dose of Emgality  was 7/1. She has had an improvement in migraines, and feels Qulipta  helps better. She has 1-2 migraines a week, the Relpax  helps better than sumatriptan . Last migraine was 3 weeks ago. She presents today for new symptoms. She has not had the typical episodes of staring with body movements in a while. She was in the ER on 7/16 for dizziness, she reported that clonazepam  and buspirone  were started 2 weeks prior and feels they are helping. On 7/21, [name] got home from work at 11pm and they went to sleep. She woke up at 1:45am to use the bathroom, he then heard a thump and she hollered for him. He found her on the floor awake, she said I  think I had a little seizure and fell off the toilet. She was on the floor and asked him to call EMS, then helped her get dressed. She was taken to [facility] where she reported she was having more frequent episodes where her body shakes while she is awake, she will get a jolt to her entire body and that happened in the bathroom making her fall forward and hit her head. Head CT and bloodwork were normal. After she got home, she has had more falls. She feels really shaky and falls forward or backward. One time she fell backwards in between the toilet and tub. Sometimes she feels dizzy before a fall, other times there is no warning. [name] has seen the falls, he recalls the fall on 7/24, she was on the commode and he knew she was not 100%, feeling dizzy, he helped her off the toilet and she was gripping his arms and he was walking backwards helping her walk. She was talking then she dropped in front of him, no loss of consciousness. Her toes flexed in with that fall. She went to [facility] ER where she was found to have a UTI, currently still taking Flagyl .  She fell in November after she got dizzy reaching for something, BP was low, she fractured her right ankle and had 2 surgeries but bones are not healing well. She has a second opinion scheduled at [facility]. Her right foot is numb, she does not feel anything on the sole of her foot. She denies  any paresthesias. No neck or back pain. No falls since 7/24. She has not felt dizzy lately, she just feels really fatigued and not herself. She gets around 6 hours of interrupted sleep. She takes Seroquel  for anxiety and depression and feels it is not really helping, she will see Psychiatry soon. She does group therapy and feels it helps some.     I had the pleasure of seeing Andrea [name] in follow-up in the neurology clinic on 12/06/2022.  The patient was last seen 7 months ago for psychogenic non-epileptic events and migraines. She is alone in the office today. Records  and images were personally reviewed where available. On her last visit, she reported bilateral hand weakness. She had an EMG/NCV of both upper extremities which was normal. For the migraines, she was started on Aimovig  which helped, however insurance would no longer cover it so she was switched to Emgality  in January. She is also on Zonisamide  400mg  at bedtime and Gabapentin  300-300-900mg  for migraine prophylaxis. The Gabapentin  is also used for anxiety, sometimes she takes an additional 2 if needed for insomnia but has not done this often lately. She is happy to report that she has not had any seizures since her last visit 7 months ago. She had a syncopal episode on 10/04/22 in the setting of hypokalemia, she reports all of a sudden everything went black and she fell. She has been doing well since medications were switched. The Emgality  has been helpful, she was down to 3-4 a month, however the last couple of weeks she has had really bad migraines 3-4 times a week. Work has been more demanding, they have been working longer hours for the summer. She gets at least 6 hours of sleep. Mood is good.    History on Initial Assessment 08/08/2021: This is a pleasant 44 year old right-handed woman with a history of hypertension, DM, migraines, bipolar disorder, presenting for evaluation of new onset seizures. She is accompanied by her husband who helps supplement the history today. She was in her usual state of health until 07/31/21 while at work at [employer], she started feeling a little funny and sat down, then woke up in the hospital. Co-workers reported she got went, went pale white, then slid down to the floor and had a 4-minute episode of jerking with EMS noting post-ictal confusion. No tongue bite or incontinence. In the ER, her husband reports she had 2 more seizures, but ER notes indicate there was an episode of upper body jerking including head jerking lasting 15-20 seconds. Bloodwork showed creatinine 1.21, glucose  265. I personally reviewed MRI brain with and without contrast which was normal, hippocampi symmetric with no abnormal signal or enhancement seen. Her wake and drowsy EEG was normal. She had been on Gabapentin  for anxiety, dose was increased to 300mg  TID. She feels her left side has been weaker since then, this has gotten better. Since hospital discharge, her husband reports more seizures. She had an unwitnessed one on 2/16 where she recalls going to the bathroom and coming out, then waking up on the ground, no injuries/tongue bite/incontinence. There was no prior warning. On 2/17, after eating a snack, she told her husband she felt funny and lay down, she then had another shaking episode lasting less than a minute. The last episode was on 2/18, they were dozing in bed, he felt her shaking and saw her lying on her left side. He thinks her eyes were closed, mouth open, it lasted less than 30 seconds,  she woke up, then had another 30-second shaking episode. She had no memory of it and told her husband her head hurt and she felt very tired. Her husband denies any staring episodes. She denies any olfactory/gustatory hallucinations, deja vu, rising epigastric sensation, focal numbness/tingling. She has occasional jerks in her legs. She used to have migraines that quieted down. She used to take Topamax  but it interacted with one of her medications. Since the initial seizure, she has had frontal throbbing headaches that recur, different from past headaches. She is sensitive to lights/sounds, no nausea/vomiting. Tylenol  helps sometimes but lately has not, she has been taking 2 650mg  tabs three times a day for the past week. She denies any diplopia, dysarthria/dysphagia, neck/back pain, bowel/bladder dysfunction. Her husband tried to check her glucose level with the last seizure, it was 271. She denies any sleep deprivation, she gets 6-8 hours of sleep with Doxepin . No recent change in stress levels. She is on Zyprexa  for  mood, mood is good. She is not having a lot of anxiety anymore. She lives with her husband and works as a insurance underwriter at [employer]. No alcohol  use. She had a normal birth and early development.  There is no history of febrile convulsions, CNS infections such as meningitis/encephalitis, significant traumatic brain injury, neurosurgical procedures, or family history of seizures.  Update 01/16/2022: Since her last visit, she was admitted for EMU monitoring from June 12-16, 2023 where Gabapentin  and Zonisamide  were held. Baseline EEG was normal, typical events were not captured. Diagnosis of migraine with aura versus nonepileptic events were discussed with them. She did well event-free for 3 months until 7/29 when she had 2 seizures followed by chest pressure, so they went to the ER. BP was 95/56, EKG showed sinus rhythm, troponin negative. Since then, she has had 2 on 7/31, and another 1.5 hours ago. She feels really tired. No tongue bite or incontinence. Her husband shows 2 videos of her typical events. During one, she is lying on the bed with eyes closed, head bobbing up and down with irregular upper body movements, heavy breathing, unresponsive to husband afterwards. Another video shows her with her head bent down, her husband notes she started staring, then head slumped down and she started having side to side head movements as he lay her on the bed with upper body rocking, eyes closed.   Prior ASMs: Topiramate  Prior migraine preventative medications: Topiramate ,  Prior migraine rescue medications: sumatriptan , Relpax , Aimovig    PAST MEDICAL HISTORY: Past Medical History:  Diagnosis Date   Allergy    Anemia    Anxiety    Arthritis    Asthma    Bipolar 1 disorder (HCC)    Bipolar 1 disorder, mixed, severe (HCC) 12/20/2016   Bipolar I disorder, most recent episode depressed (HCC) 12/20/2016   Complication of anesthesia    hard time waking up    Depression    Diabetes  mellitus without complication (HCC)    Drug induced akathisia 04/02/2022   GERD (gastroesophageal reflux disease)    no meds   Hypertension    Kidney stones    Long term current use of antipsychotic medication 03/13/2022   Low back pain    Migraines    Neuromuscular disorder (HCC)    PCOS (polycystic ovarian syndrome)    PONV (postoperative nausea and vomiting)    Schizophrenia (HCC)    Seizure (HCC) 10/03/2021   Seizures (HCC) 06/04/2016   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[facility] 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  Shortness of breath dyspnea    with bronchitis    MEDICATIONS: Medications Ordered Prior to Encounter[1]  ALLERGIES: Allergies[2]  FAMILY HISTORY: Family History  Problem Relation Age of Onset   Miscarriages / Stillbirths Mother    Hypertension Mother    Heart disease Mother    Hypertension Father    Healthy Sister    Depression Maternal Aunt    Depression Maternal Aunt    Diabetes Maternal Aunt    Breast cancer Maternal Grandmother    Healthy Brother     SOCIAL HISTORY: Social History   Socioeconomic History   Marital status: Divorced    Spouse name: Not on file   Number of children: 0   Years of education: 12   Highest education level: Some college, no degree  Occupational History   Not on file  Tobacco Use   Smoking status: Never   Smokeless tobacco: Never  Vaping Use   Vaping status: Never Used  Substance and Sexual Activity   Alcohol  use: Never   Drug use: Never   Sexual activity: Yes    Partners: Male  Other Topics Concern   Not on file  Social History Narrative   Right handed   Drinks caffeine   One story home   Social Drivers of Health   Tobacco Use: Low Risk  (06/29/2024)   Received from [facility]   Patient History    Smoking Tobacco Use: Never    Smokeless Tobacco Use: Never    Passive Exposure: Never  Financial Resource Strain: Medium Risk (05/20/2024)   Received from [facility]   Overall Financial Resource Strain (CARDIA)    Difficulty of Paying Living Expenses: Somewhat hard  Food Insecurity: No Food Insecurity (06/19/2024)   Epic    Worried About Radiation Protection Practitioner of Food in the Last Year: Never true    Ran Out of Food in the Last Year: Never true  Transportation Needs: No Transportation Needs (06/19/2024)   Epic    Lack of Transportation (Medical): No    Lack of Transportation (Non-Medical): No  Recent Concern: Transportation Needs - Unmet Transportation Needs (05/20/2024)   Received from [facility] - Transportation    In the past 12 months, has lack of transportation kept you from medical appointments or from getting medications?: Yes    Lack of Transportation (Non-Medical): No  Physical Activity: Inactive (04/19/2024)   Exercise Vital Sign    Days of Exercise per Week: 0 days    Minutes of Exercise per Session: Not on file  Stress: Stress Concern Present (04/19/2024)   [employer] of Occupational Health - Occupational Stress Questionnaire    Feeling of Stress: Very much  Social Connections: Socially Integrated (04/19/2024)   Social Connection and Isolation Panel    Frequency of Communication with Friends and Family: More than three times a week    Frequency of Social Gatherings with Friends and Family: Once a week    Attends Religious Services: More than 4 times per year    Active Member of Clubs or Organizations: Yes    Attends Banker Meetings: More than 4 times per year    Marital Status: Living with partner  Intimate Partner Violence: Not At Risk (06/19/2024)   Epic    Fear of Current or Ex-Partner: No    Emotionally Abused: No    Physically Abused: No    Sexually Abused: No  Depression (PHQ2-9): Medium Risk (06/29/2024)  Depression (PHQ2-9)    PHQ-2 Score: 5  Alcohol  Screen: Low Risk (04/19/2024)   Alcohol  Screen    Last Alcohol  Screening Score (AUDIT): 0   Housing: High Risk (06/29/2024)   Received from [facility]   Epic    In the last 12 months, was there a time when you were not able to pay the mortgage or rent on time?: Yes    In the past 12 months, how many times have you moved where you were living?: 1    At any time in the past 12 months, were you homeless or living in a shelter (including now)?: No  Utilities: Not At Risk (06/19/2024)   Epic    Threatened with loss of utilities: No  Health Literacy: Not on file     PHYSICAL EXAM: There were no vitals filed for this visit. General: No acute distress Head:  Normocephalic/atraumatic Skin/Extremities: No rash, no edema Neurological Exam: alert and oriented to person, place, and time. No aphasia or dysarthria. Fund of knowledge is appropriate.  Recent and remote memory are intact.  Attention and concentration are normal.   Cranial nerves: Pupils equal, round. Extraocular movements intact with no nystagmus. Visual fields full.  No facial asymmetry.  Motor: Bulk and tone normal, muscle strength 5/5 throughout with no pronator drift.   Finger to nose testing intact.  Gait narrow-based and steady, able to tandem walk adequately.  Romberg negative.   IMPRESSION: This is a pleasant 44 yo RH woman with a history of hypertension, DM, migraines, bipolar disorder, with functional seizures (psychogenic non-epileptic events. Her 48-hour EEG and 5-day EMU admission showed normal baseline EEG, however typical events were not captured. 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 She has not had typical spells in a while and continues to follow-up with Behavioral Health. Migraines improved with Qulipta  and prn Relpax . She presents with new symptoms of dizziness and frequent falls, etiology unclear but may relate to recently diagnosed UTI. She denies any falls or dizziness since starting treatment for UTI.We agreed that if symptoms recur, we will proceed with brain MRI without  contrast to assess for underlying structural abnormality. Continue follow-up with Behavioral Health and [facility] for second opinion on right foot injury. She is aware of [place] driving laws to stop driving after an episode of loss of awareness until 6 months event-free. Follow-up in 4 months, call for any changes.    Thank you for allowing me to participate in *** care.  Please do not hesitate to call for any questions or concerns.  The duration of this appointment visit was *** minutes of face-to-face time with the patient.  Greater than 50% of this time was spent in counseling, explanation of diagnosis, planning of further management, and coordination of care.   Darice Shivers, M.D.   CC: ***        [1] Current Outpatient Medications on File Prior to Visit  Medication Sig Dispense Refill   Albuterol -Budesonide  (AIRSUPRA) 90-80 MCG/ACT AERO      Atogepant  (QULIPTA ) 60 MG TABS Take 1 tablet daily 90 tablet 3   buPROPion  (WELLBUTRIN  XL) 300 MG 24 hr tablet Take 300 mg by mouth every morning.     busPIRone  (BUSPAR ) 10 MG tablet Take 1 tablet (10 mg total) by mouth 3 (three) times daily. 90 tablet 2   cetirizine  (ZYRTEC ) 10 MG tablet Take 10 mg by mouth at bedtime.     clonazePAM  (KLONOPIN ) 0.5 MG tablet Take 1 tablet (0.5 mg  total) by mouth 3 (three) times daily as needed for anxiety (sleep). 90 tablet 2   dicyclomine  (BENTYL ) 20 MG tablet Take 1 tablet (20 mg total) by mouth every 8 (eight) hours as needed. 15 tablet 0   DULoxetine  (CYMBALTA ) 60 MG capsule Take 2 capsules (120 mg total) by mouth daily. 180 capsule 0   eletriptan  (RELPAX ) 40 MG tablet Take 1 tablet at onset of migraine. May repeat in 2 hours if headache persists or recurs. Do not take more than 3 a week 10 tablet 5   empagliflozin  (JARDIANCE ) 25 MG TABS tablet Take 25 mg by mouth.     famotidine  (PEPCID ) 40 MG tablet Take 1 tablet (40 mg total) by mouth 2 (two) times daily. 180 tablet 0   gabapentin  (NEURONTIN ) 600  MG tablet Take 1 tablet (600 mg total) by mouth 3 (three) times daily. 90 tablet 2   hydrOXYzine  (ATARAX ) 50 MG tablet Take by mouth 2 (two) times daily.     hydrOXYzine  (VISTARIL ) 50 MG capsule Take 1 capsule (50 mg total) by mouth every 4 (four) hours as needed for anxiety. 180 capsule 2   ketorolac  (TORADOL ) 10 MG tablet Take 1 tablet (10 mg total) by mouth every 6 (six) hours as needed. 20 tablet 0   mirabegron  ER (MYRBETRIQ ) 25 MG TB24 tablet Take 25 mg by mouth daily.     montelukast  (SINGULAIR ) 10 MG tablet TAKE 1 TABLET BY MOUTH EVERYDAY AT BEDTIME 90 tablet 3   naloxone  (NARCAN ) nasal spray 4 mg/0.1 mL Place 1 spray into the nose as needed for up to 365 doses (for opioid-induced respiratory depresssion). In case of emergency (overdose), spray once into each nostril. If no response within 3 minutes, repeat application and call 911. 1 each 1   nitrofurantoin , macrocrystal-monohydrate, (MACROBID ) 100 MG capsule Take 100 mg by mouth 2 (two) times daily.     ondansetron  (ZOFRAN -ODT) 4 MG disintegrating tablet Take 1 tablet (4 mg total) by mouth every 6 (six) hours as needed for nausea or vomiting. 20 tablet 0   pantoprazole  (PROTONIX ) 40 MG tablet TAKE 1 TABLET BY MOUTH EVERY DAY IN THE MORNING 90 tablet 1   potassium chloride  (MICRO-K ) 10 MEQ CR capsule TAKE 1 CAPSULE BY MOUTH EVERY DAY 90 capsule 3   promethazine  (PHENERGAN ) 25 MG tablet Take 1 tablet (25 mg total) by mouth every 8 (eight) hours as needed for nausea or vomiting. 30 tablet 0   QUEtiapine  (SEROQUEL ) 200 MG tablet Take 200 mg by mouth at bedtime.     SYMBICORT 160-4.5 MCG/ACT inhaler Inhale 2 puffs into the lungs.     tamsulosin  (FLOMAX ) 0.4 MG CAPS capsule Take 1 capsule (0.4 mg total) by mouth daily. 30 capsule 0   traZODone  (DESYREL ) 150 MG tablet TAKE 1 TABLET BY MOUTH AT BEDTIME. 90 tablet 3   zonisamide  (ZONEGRAN ) 100 MG capsule Take 5 capsules every night 150 capsule 11   No current facility-administered  medications on file prior to visit.  [2] Allergies Allergen Reactions   Bactrim [Sulfamethoxazole -Trimethoprim] Hives and Itching   Codeine Hives and Itching   Sulfa Antibiotics Itching  "

## 2024-07-07 NOTE — Patient Instructions (Addendum)
 Recurrent UTI Prevention Strategies  Stay well hydrated. Start a bowel regimen to manage your constipation. Your goal is to have consistent, formed bowel movements that are easy for you to pass. You may use either of the over-the-counter supplements Benefiber or Miralax  to help with this. I recommend that you try Benefiber first and move on to Miralax  if this is not helping you enough. You may adjust the recommended dose of Miralax  (one capful daily) to achieve this goal. Start taking an over-the-counter cranberry supplement, with or without d-mannose, for urinary tract health. Take this once or twice daily on an empty stomach, e.g. right before bed. Start taking an over-the-counter probiotic containing the bacterial species called Lactobacillus. Take this daily. Start vaginal estrogen cream. Apply a pea-sized amount around the opening of the urethra every day for 2 weeks, then three times weekly forever.   Cystoscopy Cystoscopy is a procedure that is used to help diagnose and sometimes treat conditions that affect the lower urinary tract. The lower urinary tract includes the bladder and the urethra. The urethra is the tube that drains urine from the bladder. Cystoscopy is done using a thin, tube-shaped instrument with a light and camera at the end (cystoscope). The cystoscope may be hard or flexible, depending on the goal of the procedure. The cystoscope is inserted through the urethra, into the bladder. Cystoscopy may be recommended if you have: Urinary tract infections that keep coming back. Blood in the urine (hematuria). An inability to control when you urinate (urinary incontinence) or an overactive bladder. Unusual cells found in a urine sample. A blockage in the urethra, such as a urinary stone. Painful urination. An abnormality in the bladder found during an intravenous pyelogram (IVP) or CT scan. What are the risks? Generally, this is a safe procedure. However, problems may occur,  including: Infection. Bleeding.  What happens during the procedure?  You will be given one or more of the following: A medicine to numb the area (local anesthetic). The area around the opening of your urethra will be cleaned. The cystoscope will be passed through your urethra into your bladder. Germ-free (sterile) fluid will flow through the cystoscope to fill your bladder. The fluid will stretch your bladder so that your health care provider can clearly examine your bladder walls. Your doctor will look at the urethra and bladder. The cystoscope will be removed The procedure may vary among health care providers  What can I expect after the procedure? After the procedure, it is common to have: Some soreness or pain in your urethra. Urinary symptoms. These include: Mild pain or burning when you urinate. Pain should stop within a few minutes after you urinate. This may last for up to a few days after the procedure. A small amount of blood in your urine for several days. Feeling like you need to urinate but producing only a small amount of urine. Follow these instructions at home: General instructions Return to your normal activities as told by your health care provider.  Drink plenty of fluids after the procedure. Keep all follow-up visits as told by your health care provider. This is important. Contact a health care provider if you: Have pain that gets worse or does not get better with medicine, especially pain when you urinate lasting longer than 72 hours after the procedure. Have trouble urinating. Get help right away if you: Have blood clots in your urine. Have a fever or chills. Are unable to urinate. Summary Cystoscopy is a procedure that is used  to help diagnose and sometimes treat conditions that affect the lower urinary tract. Cystoscopy is done using a thin, tube-shaped instrument with a light and camera at the end. After the procedure, it is common to have some soreness or  pain in your urethra. It is normal to have blood in your urine after the procedure.  If you were prescribed an antibiotic medicine, take it as told by your health care provider.  This information is not intended to replace advice given to you by your health care provider. Make sure you discuss any questions you have with your health care provider. Document Revised: 05/26/2018 Document Reviewed: 05/26/2018 Elsevier Patient Education  2020 [employer].

## 2024-07-15 ENCOUNTER — Ambulatory Visit
Admission: RE | Admit: 2024-07-15 | Discharge: 2024-07-15 | Disposition: A | Discharge status: Home / Self Care | Payer: MEDICAID | Source: Ambulatory Visit | Attending: Pain Medicine | Admitting: Pain Medicine

## 2024-07-15 ENCOUNTER — Encounter: Payer: Self-pay | Admitting: Pain Medicine

## 2024-07-15 ENCOUNTER — Ambulatory Visit (HOSPITAL_BASED_OUTPATIENT_CLINIC_OR_DEPARTMENT_OTHER): Payer: MEDICAID | Admitting: Pain Medicine

## 2024-07-15 VITALS — BP 115/88 | HR 79 | Resp 16

## 2024-07-15 DIAGNOSIS — S82891G Other fracture of right lower leg, subsequent encounter for closed fracture with delayed healing: Secondary | ICD-10-CM | POA: Insufficient documentation

## 2024-07-15 DIAGNOSIS — F410 Panic disorder [episodic paroxysmal anxiety] without agoraphobia: Secondary | ICD-10-CM | POA: Insufficient documentation

## 2024-07-15 DIAGNOSIS — M25571 Pain in right ankle and joints of right foot: Secondary | ICD-10-CM

## 2024-07-15 DIAGNOSIS — M21171 Varus deformity, not elsewhere classified, right ankle: Secondary | ICD-10-CM

## 2024-07-15 DIAGNOSIS — G8928 Other chronic postprocedural pain: Secondary | ICD-10-CM

## 2024-07-15 DIAGNOSIS — Z981 Arthrodesis status: Secondary | ICD-10-CM

## 2024-07-15 DIAGNOSIS — S82891S Other fracture of right lower leg, sequela: Secondary | ICD-10-CM

## 2024-07-15 DIAGNOSIS — Z5189 Encounter for other specified aftercare: Secondary | ICD-10-CM

## 2024-07-15 DIAGNOSIS — Z792 Long term (current) use of antibiotics: Secondary | ICD-10-CM | POA: Diagnosis present

## 2024-07-15 DIAGNOSIS — F411 Generalized anxiety disorder: Secondary | ICD-10-CM | POA: Diagnosis present

## 2024-07-15 DIAGNOSIS — S82851S Displaced trimalleolar fracture of right lower leg, sequela: Secondary | ICD-10-CM

## 2024-07-15 DIAGNOSIS — M19071 Primary osteoarthritis, right ankle and foot: Secondary | ICD-10-CM | POA: Diagnosis present

## 2024-07-15 DIAGNOSIS — G8929 Other chronic pain: Secondary | ICD-10-CM | POA: Diagnosis present

## 2024-07-15 MED ORDER — PENTAFLUOROPROP-TETRAFLUOROETH EX AERO
INHALATION_SPRAY | Freq: Once | CUTANEOUS | Status: AC
  Administered 2024-07-15: 30 via TOPICAL

## 2024-07-15 MED ORDER — METHYLPREDNISOLONE ACETATE 80 MG/ML IJ SUSP
80.0000 mg | Freq: Once | INTRAMUSCULAR | Status: AC
  Administered 2024-07-15: 80 mg
  Filled 2024-07-15: qty 1

## 2024-07-15 MED ORDER — LIDOCAINE HCL 2 % IJ SOLN
20.0000 mL | Freq: Once | INTRAMUSCULAR | Status: AC
  Administered 2024-07-15: 100 mg

## 2024-07-15 MED ORDER — ROPIVACAINE HCL 2 MG/ML IJ SOLN
9.0000 mL | Freq: Once | INTRAMUSCULAR | Status: AC
  Administered 2024-07-15: 9 mL
  Filled 2024-07-15: qty 20

## 2024-07-15 MED ORDER — MIDAZOLAM HCL (PF) 2 MG/2ML IJ SOLN
0.5000 mg | Freq: Once | INTRAMUSCULAR | Status: AC
  Administered 2024-07-15: 2 mg via INTRAVENOUS
  Filled 2024-07-15: qty 2

## 2024-07-15 MED ORDER — FENTANYL CITRATE (PF) 100 MCG/2ML IJ SOLN
25.0000 ug | INTRAMUSCULAR | Status: DC | PRN
  Filled 2024-07-15: qty 2

## 2024-07-15 NOTE — Progress Notes (Signed)
 PROVIDER NOTE: Interpretation of information contained herein should be left to medically-trained personnel. Specific patient instructions are provided elsewhere under Patient Instructions section of medical record. This document was created in part using STT-dictation technology, any transcriptional errors that may result from this process are unintentional.  Patient: Andrea [name] Type: Established DOB: 1980/12/06 MRN: [id number] PCP: Gretel App, NP  Service: Procedure DOS: 07/15/2024 Setting: Ambulatory Location: Ambulatory outpatient facility Delivery: Face-to-face Provider: Eric DELENA Como, MD Specialty: Interventional Pain Management Specialty designation: 09 Location: Outpatient facility Ref. Prov.: Gretel App, NP       Interventional Therapy   Type:  Diagnostic True Ankle Steroid Injection        #1  Laterality: Right (-RT)  Level:  Ankle   DOS: 07/15/2024  Provider: Eric DELENA Como, MD Imaging: Fluoroscopy-guided         Type: Local Anesthesia Local Anesthetic: Lidocaine  1-2% Analgesia: Meaningful verbal contact maintained at all times during procedure  Indication(s): Anxiety & Analgesia Route: Infiltration ([place]/IM) IV Access: Secured   Medical Necessity Purpose: Diagnostic/Therapeutic Rationale (medical necessity): procedure needed and proper for the diagnosis and/or treatment of Andrea [name]'s medical symptoms and needs. Indications: Chronic right ankle pain severe enough to impact quality of life and/or function. 1. Chronic ankle pain (1ry area of Pain) (Right)   2. Chronic postoperative pain of ankle (Right)   3. Closed ankle fracture, sequela (Right)   4. Closed trimalleolar fracture of ankle, sequela (Right)   5. Delayed union of ankle fracture, closed (Right)   6. Delayed union of fracture of ankle, right, closed   7. 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 S/P ankle fusion (Right)    NAS-11 Pain score:   Pre-procedure:  10-Worst pain ever/10   Post-procedure: 0-No pain/10       Target:    Medial and lateral aspect of the right ankle, just anterior to the medial and lateral malleolus.  Region: Dorsal Tarsal       Type of procedure: Percutaneous joint injection   Position  Prep  Materials Position: Supine. Patient assisted into a comfortable position. Pressure points checked.  Prep solution: ChloraPrep (2% chlorhexidine  gluconate and 70% isopropyl alcohol ) The target area was identified and the area prepped in the usual manner.  Prep Area: Right ankle   region  Materials:   Tray: Block Needle(s):  Type: Regular  Gauge: 25-G Length: 1.5-in  Qty: 1  Pre-op H&P  Assessment  Time-out H&P (Pre-op Assessment):  Andrea [name] is a 44 y.o. (year old), female patient, seen today for interventional treatment. She  has a past surgical history that includes Cholecystectomy; LYMPH NODES REMOVED; ovarian cyst removed; Intrauterine device (iud) insertion (06/14/2014); Abdominal hysterectomy (N/A, 11/14/2015); Salpingoophorectomy (Bilateral, 11/14/2015); ORIF ankle fracture (Right, 05/03/2023); Ankle Fusion (Right, 05/13/2023); Fracture surgery; Xi robotic laparoscopic assisted appendectomy (N/A, 06/19/2024); and Appendectomy. Andrea [name] has a current medication list which includes the following prescription(s): airsupra, qulipta , bupropion , buspirone , cetirizine , clonazepam , dicyclomine , dotti , duloxetine , eletriptan , empagliflozin , estradiol , famotidine , gabapentin , hydroxyzine , hydroxyzine , methenamine , mirabegron  er, montelukast , naloxone , ondansetron , pantoprazole , potassium chloride , quetiapine , symbicort, tizanidine , trazodone , zonisamide , bupropion , ketorolac , and promethazine , and the following Facility-Administered Medications: fentanyl . Her primarily concern today is the Ankle Pain (Right )  Initial Vital Signs:  Pulse/HCG Rate: 79ECG Heart Rate: 72 Temp:   Resp: 18 BP: 134/82 SpO2: 97 %  BMI:  Estimated body mass index is 37.66 kg/m as calculated from the following:   Height as of 07/07/24: 5' 11 (1.803 m).  Weight as of 07/07/24: 270 lb (122.5 kg).  Risk Assessment: Allergies: Reviewed. She is allergic to bactrim [sulfamethoxazole -trimethoprim], codeine, and sulfa antibiotics.  Allergy Precautions: None required Coagulopathies: Reviewed. None identified.  Blood-thinner therapy: None at this time Active Infection(s): Reviewed. None identified. Andrea [name] is afebrile  Site Confirmation: Andrea [name] was asked to confirm the procedure and laterality before marking the site Procedure checklist: Completed Consent: Before the procedure and under the influence of no sedative(s), amnesic(s), or anxiolytics, the patient was informed of the treatment options, risks and possible complications. To fulfill our ethical and legal obligations, as recommended by the American Medical Association's Code of Ethics, I have informed the patient of my clinical impression; the nature and purpose of the treatment or procedure; the risks, benefits, and possible complications of the intervention; the alternatives, including doing nothing; the risk(s) and benefit(s) of the alternative treatment(s) or procedure(s); and the risk(s) and benefit(s) of doing nothing. The patient was provided information about the general risks and possible complications associated with the procedure. These may include, but are not limited to: failure to achieve desired goals, infection, bleeding, organ or nerve damage, allergic reactions, paralysis, and death. In addition, the patient was informed of those risks and complications associated to the procedure, such as failure to decrease pain; infection; bleeding; organ or nerve damage with subsequent damage to sensory, motor, and/or autonomic systems, resulting in permanent pain, numbness, and/or weakness of one or several areas of the body; allergic reactions; (i.e.: anaphylactic  reaction); and/or death. Furthermore, the patient was informed of those risks and complications associated with the medications. These include, but are not limited to: allergic reactions (i.e.: anaphylactic or anaphylactoid reaction(s)); adrenal axis suppression; blood sugar elevation that in diabetics may result in ketoacidosis or comma; water retention that in patients with history of congestive heart failure may result in shortness of breath, pulmonary edema, and decompensation with resultant heart failure; weight gain; swelling or edema; medication-induced neural toxicity; particulate matter embolism and blood vessel occlusion with resultant organ, and/or nervous system infarction; and/or aseptic necrosis of one or more joints. Finally, the patient was informed that Medicine is not an exact science; therefore, there is also the possibility of unforeseen or unpredictable risks and/or possible complications that may result in a catastrophic outcome. The patient indicated having understood very clearly. We have given the patient no guarantees and we have made no promises. Enough time was given to the patient to ask questions, all of which were answered to the patient's satisfaction. Ms. Fry has indicated that she wanted to continue with the procedure. Attestation: I, the ordering provider, attest that I have discussed with the patient the benefits, risks, side-effects, alternatives, likelihood of achieving goals, and potential problems during recovery for the procedure that I have provided informed consent. Date  Time: 07/15/2024 12:04 PM  Pre-Procedure Preparation:  Monitoring: As per clinic protocol. Respiration, ETCO2, SpO2, BP, heart rate and rhythm monitor placed and checked for adequate function Safety Precautions: Patient was assessed for positional comfort and pressure points before starting the procedure. Time-out: I initiated and conducted the Time-out before starting the procedure, as per  protocol. The patient was asked to participate by confirming the accuracy of the Time Out information. Verification of the correct person, site, and procedure were performed and confirmed by me, the nursing staff, and the patient. Time-out conducted as per Joint Commission's Universal Protocol (UP.01.01.01). Time: 1152 Start Time: 1152 hrs.   Narrative  Start Time: 1152 hrs. Standard Safety Precautions: Protocol guidelines were followed. Aspiration was conducted prior to injection. At no point did I inject any substances, as a needle was being advanced. No attempts were made at seeking a paresthesia. Safe injection practices and needle disposal techniques used. Medications properly checked for expiration dates. SDV (single dose vial) medications used.  Local Anesthesia: Skin & deeper tissues infiltrated with local anesthetic. Appropriate amount of time allowed for local anesthetics to take effect.   Technical description: The procedure needles were then advanced to the target area. Proper needle placement secured. Negative aspiration confirmed. Solution injected in intermittent fashion, asking for systemic symptoms every 0.5cc of injectate. The needles were then removed and the area cleansed, making sure to leave some of the prepping solution back to take advantage of its long term bactericidal properties. Approach: Bilateral approach. Area Prepped: Entire dorsal foot Region ChloraPrep (2% chlorhexidine  gluconate and 70% isopropyl alcohol ) Safety Precautions: Aspiration looking for blood return was conducted prior to all injections. At no point did we inject any substances, as a needle was being advanced. No attempts were made at seeking any paresthesias. Safe injection practices and needle disposal techniques used. Medications properly checked for expiration dates. SDV (single dose vial) medications used. Description of the Procedure: Protocol guidelines were followed. The patient  was placed in position. The target area was identified and the area prepped in the usual manner. Skin & deeper tissues infiltrated with local anesthetic. Appropriate amount of time allowed to pass for local anesthetics to take effect. The procedure needles were then advanced to the target area. Proper needle placement secured. Negative aspiration confirmed. Solution injected in intermittent fashion, asking for systemic symptoms every 0.5cc of injectate. The needles were then removed and the area cleansed, making sure to leave some of the prepping solution back to take advantage of its long term bactericidal properties.                          Vitals:   07/15/24 1148 07/15/24 1153 07/15/24 1159 07/15/24 1203  BP: 134/82 129/88 122/87 115/88  Pulse:    79  Resp: 18 15 18 16   SpO2: 97% 97% 97% 98%     End Time: 1158 hrs.  Imaging Guidance               Imaging Guidance (Non-Spinal):          Type of Imaging Technique: Fluoroscopy Guidance (Non-Spinal) Indication(s): Fluoroscopy guidance for needle placement to enhance accuracy in procedures requiring precise needle localization for targeted delivery of medication in or near specific anatomical locations not easily accessible without such real-time imaging assistance. Exposure Time: Please see nurses notes. Contrast: None used. Fluoroscopic Guidance: I was personally present during the use of fluoroscopy. Tunnel Vision Technique used to obtain the best possible view of the target area. Parallax error corrected before commencing the procedure. Direction-depth-direction technique used to introduce the needle under continuous pulsed fluoroscopy. Once target was reached, antero-posterior, oblique, and lateral fluoroscopic projection used confirm needle placement in all planes. Images permanently stored in EMR. Interpretation: No contrast injected. I personally interpreted the imaging intraoperatively. Adequate needle placement confirmed in  multiple planes. Permanent images saved into the patient's record.    Post-operative Assessment Post-operative Assessment:  Post-procedure Vital Signs:  Pulse/HCG Rate: 7978 Temp:   Resp: 16 BP: 115/88 SpO2: 98 %  EBL: None  Complications: No immediate post-treatment complications observed by team, or reported by patient.  Note: The patient tolerated the entire procedure well. A repeat set of vitals were taken after the procedure and the patient was kept under observation following institutional policy, for this type of procedure. Post-procedural neurological assessment was performed, showing return to baseline, prior to discharge. The patient was provided with post-procedure discharge instructions, including a section on how to identify potential problems. Should any problems arise concerning this procedure, the patient was given instructions to immediately contact us , at any time, without hesitation. In any case, we plan to contact the patient by telephone for a follow-up status report regarding this interventional procedure.  Comments:  No additional relevant information.   Plan of Care (POC) Plan of Care (POC)  Orders:  Orders Placed This Encounter  Procedures   Medium Joint Injection/Arthrocentesis    Level(s): Ankle Laterality: Right Procedural Analgesia/Anxiolysis: Patient's choice Purpose: Diagnostic Indication(s): Chronic    Scheduling Instructions:     Requested Scheduling Timeframe: Today   [facility] PA[facility] C-ARM 1-60 MIN NO REPORT    Intraoperative interpretation by procedural physician at [facility].    Standing Status:   Standing    Number of Occurrences:   1    Reason for exam::   Assistance in needle guidance and placement for procedures requiring needle placement in or near specific anatomical locations not easily accessible without such assistance.   Informed Consent Details: Physician/Practitioner Attestation; Transcribe to consent form and obtain  patient signature    Nursing Order: Transcribe to consent form and obtain patient signature. Note: Always confirm laterality of pain with Ms. Merilee, before procedure.    Physician/Practitioner attestation of informed consent for procedure/surgical case:   I, the physician/practitioner, attest that I have discussed with the patient the benefits, risks, side effects, alternatives, likelihood of achieving goals and potential problems during recovery for the procedure that I have provided informed consent.    Procedure:   Right ankle block    Physician/Practitioner performing the procedure:   Ethelean Colla A. Tanya, MD    Indication/Reason:   Chronic post-operative ankle pain   Provide equipment / supplies at bedside    Procedure tray: Block Tray (Disposable  single use) Skin infiltration needle: Regular 1.5-in, 25-G, (x1) Block Needle type: Regular Amount/quantity: 1 Size: Short(1.5-inch) Gauge: (25G x1) + (22G x1)    Standing Status:   Standing    Number of Occurrences:   1    Specify:   Block Tray   Saline lock IV    Have LR [id number] mL available and administer at 125 mL/hr if patient becomes hypotensive.    Standing Status:   Standing    Number of Occurrences:   1     Opioid Analgesic: Oxycodone /APAP 5/325, 1 tab p.o. every 4 hours (# 30) (last filled on 04/13/2024) (According to PMP, patient has been on some form of opioid analgesics since before September 2023.) MME/day: 32.14-90 mg/day    Medications ordered for procedure: Meds ordered this encounter  Medications   lidocaine  (XYLOCAINE ) 2 % (with pres) injection 400 mg   pentafluoroprop-tetrafluoroeth (GEBAUERS) aerosol   midazolam  PF (VERSED ) injection 0.5-2 mg    Make sure Flumazenil is available in the pyxis when using this medication. If oversedation occurs, administer 0.2 mg IV over 15 sec. If after 45 sec no response, administer 0.2 mg again over 1 min; may repeat at 1 min intervals; not to exceed 4 doses (1 mg)    fentaNYL  (SUBLIMAZE ) injection 25-50 mcg    Make sure Narcan  is available in  the pyxis when using this medication. In the event of respiratory depression (RR< 8/min): Titrate NARCAN  (naloxone ) in increments of 0.1 to 0.2 mg IV at 2-3 minute intervals, until desired degree of reversal.   methylPREDNISolone  acetate (DEPO-MEDROL ) injection 80 mg   ropivacaine  (PF) 2 mg/mL (0.2%) (NAROPIN ) injection 9 mL   Medications administered: We administered lidocaine , pentafluoroprop-tetrafluoroeth, midazolam  PF, methylPREDNISolone  acetate, and ropivacaine  (PF) 2 mg/mL (0.2%).  See the medical record for exact dosing, route, and time of administration.    Interventional Therapies  Risk Factors  Considerations  Medical Comorbidities:  MO (BMI>40)  T2NIDDM  Hx. Seizures  HTN  Hypotension w/ syncope  BA  PTSD  Bipolar Disorder  GAD  Hx. Panic Disorder  schizoaffective disorder  GERD  BNZ Use  Opioid Analgesic Use     Planned  Pending:   Diagnostic/therapeutic right ankle steroid injection #1 (preprocedure antibiotics needed)  Referral to physical therapy ordered (06/07/2024)    Under consideration:   Diagnostic/therapeutic right ankle steroid injection #1 (preprocedure antibiotics needed)    Completed: (Analgesic benefit)1  None at this time   Therapeutic  Palliative (PRN) options:   None established   Completed by other providers:   None reported  1(Analgesic benefit): Expressed in percentage (%). (Local anesthetic[LA] +/- sedation  L.A.Local Anesthetic  Steroid benefit  Ongoing benefit)  Pharmacotherapy  Prescription sent to the pharmacy for Narcan  since she is taking benzodiazepines and opioids and has increased risk of respiratory depression secondary to a drug to drug interaction.        Follow-up plan:   Return in about 2 weeks (around 07/29/2024) for (Face2F), (PPE).     Recent Visits Date Type Provider Dept  06/07/24 Office Visit Tanya Glisson, MD [facility]  Showing recent visits within past 90 days and meeting all other requirements Today's Visits Date Type Provider Dept  07/15/24 Procedure visit Tanya Glisson, MD [facility]  Showing today's visits and meeting all other requirements Future Appointments Date Type Provider Dept  07/28/24 Appointment Tanya Glisson, MD [facility]  Showing future appointments within next 90 days and meeting all other requirements   Disposition: Discharge home  Discharge (Date  Time): 07/15/2024; 1210 hrs.   Primary Care Physician: Gretel App, NP Location: [facility] Note by: Glisson DELENA Tanya, MD (TTS technology used. I apologize for any typographical errors that were not detected and corrected.) Date: 07/15/2024; Time: 12:12 PM  Disclaimer:  Medicine is not an visual merchandiser. The only guarantee in medicine is that nothing is guaranteed. It is important to note that the decision to proceed with this intervention was based on the information collected from the patient. The Data and conclusions were drawn from the patient's questionnaire, the interview, and the physical examination. Because the information was provided in large part by the patient, it cannot be guaranteed that it has not been purposely or unconsciously manipulated. Every effort has been made to obtain as much relevant data as possible for this evaluation. It is important to note that the conclusions that lead to this procedure are derived in large part from the available data. Always take into account that the treatment will also be dependent on availability of resources and existing treatment guidelines, considered by other Pain Management Practitioners as being common knowledge and practice, at the time of the intervention. For Medico-Legal purposes, it is also important to point out that variation in procedural techniques and pharmacological choices are the acceptable norm.  The indications, contraindications, technique, and results of the above procedure should only be interpreted and judged by a Board-Certified Interventional Pain Specialist with extensive familiarity and expertise in the same exact procedure and technique.

## 2024-07-15 NOTE — Progress Notes (Signed)
 Safety precautions to be maintained throughout the outpatient stay will include: orient to surroundings, keep bed in low position, maintain call bell within reach at all times, provide assistance with transfer out of bed and ambulation.

## 2024-07-15 NOTE — Patient Instructions (Signed)
 ______________________________________________________________________    Post-Procedure Discharge Instructions  INSTRUCTIONS Apply ice:  Purpose: This will minimize any swelling and discomfort after procedure.  When: Day of procedure, as soon as you get home. How: Fill a plastic sandwich bag with crushed ice. Cover it with a small towel and apply to injection site. How long: (15 min on, 15 min off) Apply for 15 minutes then remove x 15 minutes.  Repeat sequence on day of procedure, until you go to bed. Apply heat:  Purpose: To treat any soreness and discomfort from the procedure. When: Starting the next day after the procedure. How: Apply heat to procedure site starting the day following the procedure. How long: May continue to repeat daily, until discomfort goes away. Food intake: Start with clear liquids (like water) and advance to regular food, as tolerated.  Physical activities: Keep activities to a minimum for the first 8 hours after the procedure. After that, then as tolerated. Driving: If you have received any sedation, be responsible and do not drive. You are not allowed to drive for 24 hours after having sedation. Blood thinner: (Applies only to those taking blood thinners) You may restart your blood thinner 6 hours after your procedure. Insulin: (Applies only to Diabetic patients taking insulin) As soon as you can eat, you may resume your normal dosing schedule. Infection prevention: Keep procedure site clean and dry. Shower daily and clean area with soap and water.  PAIN DIARY Post-procedure Pain Diary: Extremely important that this be done correctly and accurately. Recorded information will be used to determine the next step in treatment. For the purpose of accuracy, follow these rules: Evaluate only the area treated. Do not report or include pain from an untreated area. For the purpose of this evaluation, ignore all other areas of pain, except for the treated area. After your  procedure, avoid taking a long nap and attempting to complete the pain diary after you wake up. Instead, set your alarm clock to go off every hour, on the hour, for the initial 8 hours after the procedure. Document the duration of the numbing medicine, and the relief you are getting from it. Do not go to sleep and attempt to complete it later. It will not be accurate. If you received sedation, it is likely that you were given a medication that may cause amnesia. Because of this, completing the diary at a later time may cause the information to be inaccurate. This information is needed to plan your care. Follow-up appointment: Keep your post-procedure follow-up evaluation appointment after the procedure (usually 2 weeks for most procedures, 6 weeks for radiofrequencies). DO NOT FORGET to bring you pain diary with you.   EXPECT... (What should I expect to see with my procedure?) From numbing medicine (AKA: Local Anesthetics): Numbness or decrease in pain. You may also experience some weakness, which if present, could last for the duration of the local anesthetic. Onset: Full effect within 15 minutes of injected. Duration: It will depend on the type of local anesthetic used. On the average, 1 to 8 hours.  From steroids (Applies only if steroids were used): Decrease in swelling or inflammation. Once inflammation is improved, relief of the pain will follow. Onset of benefits: Depends on the amount of swelling present. The more swelling, the longer it will take for the benefits to be seen. In some cases, up to 10 days. Duration: Steroids will stay in the system x 2 weeks. Duration of benefits will depend on multiple posibilities including persistent irritating  factors. Side-effects: If present, they may typically last 2 weeks (the duration of the steroids). Frequent: Cramps (if they occur, drink Gatorade and take over-the-counter Magnesium 450-500 mg once to twice a day); water retention with temporary weight  gain; increases in blood sugar; decreased immune system response; increased appetite. Occasional: Facial flushing (red, warm cheeks); mood swings; menstrual changes. Uncommon: Long-term decrease or suppression of natural hormones; bone thinning. (These are more common with higher doses or more frequent use. This is why we prefer that our patients avoid having any injection therapies in other practices.)  Very Rare: Severe mood changes; psychosis; aseptic necrosis. From procedure: Some discomfort is to be expected once the numbing medicine wears off. This should be minimal if ice and heat are applied as instructed.  CALL IF... (When should I call?) You experience numbness and weakness that gets worse with time, as opposed to wearing off. New onset bowel or bladder incontinence. (Applies only to procedures done in the spine)  Emergency Numbers: Durning business hours (Monday - Thursday, 8:00 AM - 4:00 PM) (Friday, 9:00 AM - 12:00 Noon): [phone] After hours: [phone] NOTE: If you are having a problem and are unable connect with, or to talk to a provider, then go to your nearest urgent care or emergency department. If the problem is serious and urgent, please call 911. ______________________________________________________________________     ______________________________________________________________________    Steroid injections  Common steroids for injections Triamcinolone : Used by many sports medicine physicians for large joint and bursal injections, often combined with a local anesthetic like lidocaine . A study focusing on coccydynia (tailbone pain) found triamcinolone  was more effective than betamethasone, suggesting it may also be preferable for other localized inflammation conditions. Methylprednisolone: A common alternative to triamcinolone  that is also a strong anti-inflammatory. It is available in different formulations, with the acetate suspension being the long-acting  option for intra-articular injections. Dexamethasone : This is a non-particulate steroid, meaning it has a lower risk of tissue damage compared to particulate steroids like triamcinolone  and methylprednisolone. While less common for this specific use, it is an option for targeted injections.   Considerations for physicians Particulate vs. non-particulate steroids: Triamcinolone  and methylprednisolone are particulate, meaning they can clump together. Dexamethasone  is non-particulate. Particulate steroids are often preferred for their longer-lasting effects but carry a theoretical higher risk for certain injections (though this is less of a concern in the costochondral joints). Combined injectate: Corticosteroids are typically mixed with a local anesthetic like lidocaine  to provide both immediate pain relief (from the anesthetic) and longer-term inflammation reduction (from the steroid). Imaging guidance: To ensure accurate placement of the needle and medication, physicians may use ultrasound or fluoroscopic guidance for the injection, especially in complex or refractory cases.   Patient guidance Before undergoing a steroid injection, discuss the options with your physician. They will determine the best steroid, dosage, and procedure for your specific case based on factors like: Severity of your condition History of response to other treatments Your overall health status Experience and preference of the physician  Last  Updated: 02/10/2024 ______________________________________________________________________

## 2024-07-18 ENCOUNTER — Emergency Department (HOSPITAL_COMMUNITY): Payer: MEDICAID

## 2024-07-18 ENCOUNTER — Emergency Department (HOSPITAL_COMMUNITY)
Admission: EM | Admit: 2024-07-18 | Discharge: 2024-07-18 | Disposition: A | Discharge status: Home / Self Care | Payer: MEDICAID | Attending: Emergency Medicine | Admitting: Emergency Medicine

## 2024-07-18 ENCOUNTER — Other Ambulatory Visit: Payer: Self-pay

## 2024-07-18 ENCOUNTER — Encounter (HOSPITAL_COMMUNITY): Payer: Self-pay | Admitting: *Deleted

## 2024-07-18 DIAGNOSIS — M79671 Pain in right foot: Secondary | ICD-10-CM | POA: Insufficient documentation

## 2024-07-18 LAB — BASIC METABOLIC PANEL WITH GFR
Anion gap: 12 (ref 5–15)
BUN: 16 mg/dL (ref 6–20)
CO2: 26 mmol/L (ref 22–32)
Calcium: 9.1 mg/dL (ref 8.9–10.3)
Chloride: 106 mmol/L (ref 98–111)
Creatinine, Ser: 0.81 mg/dL (ref 0.44–1.00)
GFR, Estimated: >60 mL/min
Glucose, Bld: 126 mg/dL — ABNORMAL HIGH (ref 70–99)
Potassium: 4.1 mmol/L (ref 3.5–5.1)
Sodium: 144 mmol/L (ref 135–145)

## 2024-07-18 LAB — CBC WITH DIFFERENTIAL/PLATELET
Abs Immature Granulocytes: 0.04 10*3/uL (ref 0.00–0.07)
Basophils Absolute: 0.1 10*3/uL (ref 0.0–0.1)
Basophils Relative: 1 %
Eosinophils Absolute: 0.5 10*3/uL (ref 0.0–0.5)
Eosinophils Relative: 7 %
HCT: 41.2 % (ref 36.0–46.0)
Hemoglobin: 12.8 g/dL (ref 12.0–15.0)
Immature Granulocytes: 1 %
Lymphocytes Relative: 41 %
Lymphs Abs: 3 10*3/uL (ref 0.7–4.0)
MCH: 27.9 pg (ref 26.0–34.0)
MCHC: 31.1 g/dL (ref 30.0–36.0)
MCV: 90 fL (ref 80.0–100.0)
Monocytes Absolute: 0.4 10*3/uL (ref 0.1–1.0)
Monocytes Relative: 6 %
Neutro Abs: 3.3 10*3/uL (ref 1.7–7.7)
Neutrophils Relative %: 44 %
Platelets: 232 10*3/uL (ref 150–400)
RBC: 4.58 MIL/uL (ref 3.87–5.11)
RDW: 12 % (ref 11.5–15.5)
WBC: 7.3 10*3/uL (ref 4.0–10.5)
nRBC: 0 % (ref 0.0–0.2)

## 2024-07-18 MED ORDER — KETOROLAC TROMETHAMINE 15 MG/ML IJ SOLN
15.0000 mg | Freq: Once | INTRAMUSCULAR | Status: AC
  Administered 2024-07-18: 15 mg via INTRAMUSCULAR
  Filled 2024-07-18: qty 1

## 2024-07-18 MED ORDER — HYDROCODONE-ACETAMINOPHEN 5-325 MG PO TABS: 1.0000 | ORAL_TABLET | Freq: Four times a day (QID) | ORAL | 0 refills | Status: AC | PRN

## 2024-07-18 NOTE — Discharge Instructions (Addendum)
 Your x-rays showed concerns for changes of the hardware in your right foot.  Please follow-up with your orthopedic as soon as possible for further evaluation and treatment.  I have given you a short course of pain medication please use Tylenol  and ibuprofen  prior to use of pain medication.  Use your walker for ambulation at home and use the boot as needed for assistance with ambulation.  If any concerning new or worsening symptoms arise please return to emergency department for further evaluation.

## 2024-07-18 NOTE — ED Triage Notes (Addendum)
 Pt states she fell today due to her balance is off which pt states is new. States unsteady for the past week.  Denies any dizziness. C/o right ankle pain since her fall today. Denies hitting her head. Denies blood thinners.

## 2024-07-18 NOTE — ED Notes (Signed)
 Radiology in the room to xray R foot.

## 2024-07-18 NOTE — ED Notes (Signed)
 Pt ambulated to the bathroom with the assistance of family member. Reports her pain is 10/10.

## 2024-07-21 ENCOUNTER — Encounter: Payer: Self-pay | Admitting: Urology

## 2024-07-21 ENCOUNTER — Ambulatory Visit: Payer: MEDICAID | Admitting: Urology

## 2024-07-21 VITALS — BP 146/100 | HR 65 | Ht 71.0 in | Wt 270.0 lb

## 2024-07-21 DIAGNOSIS — N390 Urinary tract infection, site not specified: Secondary | ICD-10-CM

## 2024-07-21 LAB — URINALYSIS, COMPLETE
Bilirubin, UA: NEGATIVE
Ketones, UA: NEGATIVE
Leukocytes,UA: NEGATIVE
Nitrite, UA: NEGATIVE
Protein,UA: NEGATIVE
RBC, UA: NEGATIVE
Specific Gravity, UA: 1.01 (ref 1.005–1.030)
Urobilinogen, Ur: 0.2 mg/dL (ref 0.2–1.0)
pH, UA: 6.5 (ref 5.0–7.5)

## 2024-07-21 LAB — MICROSCOPIC EXAMINATION

## 2024-07-21 NOTE — Progress Notes (Signed)
" ° °  07/21/24  CC:  Chief Complaint  Patient presents with   Cysto    HPI: Refer to [facility] prior note 07/07/2024.  UA today dipstick 3+ glucose/microscopy 6-10 WBC  Blood pressure (!) 154/111, pulse 65, height 5' 11 (1.803 m), weight 270 lb (122.5 kg). NED. A&Ox3.   Normal external genitalia with patent urethral meatus  Cystoscopy Procedure Note  Patient identification was confirmed, informed consent was obtained, and patient was prepped using Betadine solution.  Lidocaine  jelly was administered per urethral meatus.    Procedure: - Flexible cystoscope introduced, without any difficulty.   - Thorough search of the bladder revealed:    normal urethral meatus    normal urothelium    no stones    no ulcers     no tumors    no urethral polyps    no trabeculation  - Ureteral orifices were normal in position and appearance.  Post-Procedure: - Patient tolerated the procedure well  Assessment/ Plan: No abnormalities noted on cystoscopy Continue low-dose vaginal estrogen/Hiprex     Jenniger Figiel C Takeyla Million, MD "

## 2024-07-21 NOTE — Progress Notes (Signed)
 Patient presents for an office visit. BP today is High. Greater than 140/90. Provider  notified and recheck Blood Pressure 146 100.  Pt advised to talk with PCP.  Pt voiced understanding.

## 2024-07-22 ENCOUNTER — Telehealth: Payer: Self-pay | Admitting: Pain Medicine

## 2024-07-22 ENCOUNTER — Other Ambulatory Visit: Payer: Self-pay

## 2024-07-22 MED ORDER — FOSFOMYCIN TROMETHAMINE 3 G PO PACK: 3.0000 g | PACK | Freq: Once | ORAL | 0 refills | Status: AC

## 2024-07-22 NOTE — Telephone Encounter (Signed)
 Pt was in hospital due to a fall. Pt surgeon told her to contact pain clinic because she is under this clinic for meds.

## 2024-07-23 NOTE — Telephone Encounter (Signed)
 I spoke with patient re; fall and imaging.  She reports that the surgeon has placed I her boot post fall and there was an Rx for hydro - apap qty 10 tablets written on 07/18/24 by Fonda Siva PA. She was told to call our office for further pain medication.  I have explained that we do not prescribe medications for acute issues, [facility] is not currently prescribing medication,  imaging is negative except for loose hardware that is continuing to heal and have placed in a boot for stabilization.  Patient is currently taking tylenol  and reports it is not helping, is not able to take ibuprofen  because she is taking celebrex .  She has an appt for f/up on Wednesday 07/28/24 with [facility] and I told her that she could discuss this matter with him at that time.  Patient verbalizes u/o information.

## 2024-07-28 ENCOUNTER — Ambulatory Visit: Payer: MEDICAID | Admitting: Pain Medicine

## 2024-07-30 ENCOUNTER — Encounter: Payer: MEDICAID | Admitting: Nurse Practitioner

## 2024-09-01 ENCOUNTER — Encounter: Payer: MEDICAID | Admitting: Nurse Practitioner

## 2024-10-21 ENCOUNTER — Ambulatory Visit: Payer: MEDICAID | Admitting: Physician Assistant

## 2024-10-25 ENCOUNTER — Ambulatory Visit: Payer: MEDICAID | Admitting: Neurology

## 2025-01-07 ENCOUNTER — Ambulatory Visit: Payer: Self-pay | Admitting: Neurology
